# Patient Record
Sex: Female | Born: 1950 | Race: Black or African American | Hispanic: No | Marital: Married | Smoking: Former smoker
Health system: Southern US, Community
[De-identification: ages and names within clinical notes are randomized; demographics above are authoritative.]

## PROBLEM LIST (undated history)

## (undated) DIAGNOSIS — D151 Benign neoplasm of heart: Secondary | ICD-10-CM

## (undated) DIAGNOSIS — M109 Gout, unspecified: Secondary | ICD-10-CM

## (undated) DIAGNOSIS — N185 Chronic kidney disease, stage 5: Secondary | ICD-10-CM

## (undated) DIAGNOSIS — J449 Chronic obstructive pulmonary disease, unspecified: Secondary | ICD-10-CM

## (undated) DIAGNOSIS — I10 Essential (primary) hypertension: Secondary | ICD-10-CM

## (undated) DIAGNOSIS — D696 Thrombocytopenia, unspecified: Secondary | ICD-10-CM

## (undated) DIAGNOSIS — G118 Other hereditary ataxias: Secondary | ICD-10-CM

## (undated) DIAGNOSIS — I509 Heart failure, unspecified: Secondary | ICD-10-CM

## (undated) DIAGNOSIS — L439 Lichen planus, unspecified: Secondary | ICD-10-CM

## (undated) DIAGNOSIS — D649 Anemia, unspecified: Secondary | ICD-10-CM

## (undated) DIAGNOSIS — D571 Sickle-cell disease without crisis: Secondary | ICD-10-CM

## (undated) HISTORY — PX: GALLBLADDER SURGERY: SHX652

## (undated) HISTORY — PX: TUBAL LIGATION: SHX77

## (undated) HISTORY — DX: Lichen planus, unspecified: L43.9

## (undated) HISTORY — DX: Chronic obstructive pulmonary disease, unspecified: J44.9

## (undated) HISTORY — PX: EYE SURGERY: SHX253

## (undated) HISTORY — PX: CHOLECYSTECTOMY: SHX55

## (undated) HISTORY — DX: Heart failure, unspecified: I50.9

## (undated) HISTORY — DX: Benign neoplasm of heart: D15.1

## (undated) HISTORY — DX: Other hereditary ataxias: G11.8

## (undated) HISTORY — DX: Essential (primary) hypertension: I10

## (undated) HISTORY — DX: Sickle-cell disease without crisis: D57.1

## (undated) HISTORY — DX: Gout, unspecified: M10.9

---

## 2006-01-27 ENCOUNTER — Ambulatory Visit: Payer: Self-pay | Admitting: Internal Medicine

## 2006-01-30 ENCOUNTER — Ambulatory Visit: Payer: Self-pay | Admitting: Internal Medicine

## 2006-03-10 ENCOUNTER — Ambulatory Visit: Payer: Self-pay | Admitting: Internal Medicine

## 2006-04-01 ENCOUNTER — Ambulatory Visit: Payer: Self-pay | Admitting: Internal Medicine

## 2006-06-08 ENCOUNTER — Ambulatory Visit: Payer: Self-pay | Admitting: Internal Medicine

## 2006-07-02 ENCOUNTER — Ambulatory Visit: Payer: Self-pay | Admitting: Internal Medicine

## 2006-08-31 ENCOUNTER — Ambulatory Visit: Payer: Self-pay | Admitting: Internal Medicine

## 2006-09-06 ENCOUNTER — Ambulatory Visit: Payer: Self-pay | Admitting: Internal Medicine

## 2006-09-30 ENCOUNTER — Ambulatory Visit: Payer: Self-pay | Admitting: Internal Medicine

## 2006-12-31 ENCOUNTER — Ambulatory Visit: Payer: Self-pay | Admitting: Internal Medicine

## 2007-01-10 ENCOUNTER — Ambulatory Visit: Payer: Self-pay | Admitting: Internal Medicine

## 2007-01-31 ENCOUNTER — Ambulatory Visit: Payer: Self-pay | Admitting: Internal Medicine

## 2007-03-08 ENCOUNTER — Ambulatory Visit: Payer: Self-pay | Admitting: Internal Medicine

## 2007-04-02 ENCOUNTER — Ambulatory Visit: Payer: Self-pay | Admitting: Internal Medicine

## 2007-05-02 ENCOUNTER — Ambulatory Visit: Payer: Self-pay | Admitting: Internal Medicine

## 2007-05-03 ENCOUNTER — Ambulatory Visit: Payer: Self-pay | Admitting: Internal Medicine

## 2007-06-02 ENCOUNTER — Ambulatory Visit: Payer: Self-pay | Admitting: Internal Medicine

## 2007-09-05 ENCOUNTER — Ambulatory Visit: Payer: Self-pay | Admitting: Internal Medicine

## 2007-10-31 ENCOUNTER — Ambulatory Visit: Payer: Self-pay | Admitting: Internal Medicine

## 2007-11-08 ENCOUNTER — Ambulatory Visit: Payer: Self-pay | Admitting: Internal Medicine

## 2007-11-30 ENCOUNTER — Ambulatory Visit: Payer: Self-pay | Admitting: Internal Medicine

## 2008-03-01 ENCOUNTER — Ambulatory Visit: Payer: Self-pay | Admitting: Internal Medicine

## 2008-03-12 ENCOUNTER — Ambulatory Visit: Payer: Self-pay | Admitting: Internal Medicine

## 2008-04-01 ENCOUNTER — Ambulatory Visit: Payer: Self-pay | Admitting: Internal Medicine

## 2008-08-30 ENCOUNTER — Ambulatory Visit: Payer: Self-pay | Admitting: Internal Medicine

## 2008-09-18 ENCOUNTER — Ambulatory Visit: Payer: Self-pay | Admitting: Internal Medicine

## 2008-09-29 ENCOUNTER — Ambulatory Visit: Payer: Self-pay | Admitting: Internal Medicine

## 2008-12-30 ENCOUNTER — Ambulatory Visit: Payer: Self-pay | Admitting: Internal Medicine

## 2009-01-16 ENCOUNTER — Ambulatory Visit: Payer: Self-pay | Admitting: Internal Medicine

## 2009-01-30 ENCOUNTER — Ambulatory Visit: Payer: Self-pay | Admitting: Internal Medicine

## 2009-04-01 ENCOUNTER — Ambulatory Visit: Payer: Self-pay | Admitting: Internal Medicine

## 2009-04-16 ENCOUNTER — Ambulatory Visit: Payer: Self-pay | Admitting: Internal Medicine

## 2009-05-01 ENCOUNTER — Ambulatory Visit: Payer: Self-pay | Admitting: Internal Medicine

## 2009-05-28 ENCOUNTER — Emergency Department: Payer: Self-pay | Admitting: Emergency Medicine

## 2009-07-30 ENCOUNTER — Ambulatory Visit: Payer: Self-pay | Admitting: Internal Medicine

## 2009-08-15 ENCOUNTER — Other Ambulatory Visit: Payer: Self-pay | Admitting: Internal Medicine

## 2009-08-15 ENCOUNTER — Ambulatory Visit: Payer: Self-pay | Admitting: Internal Medicine

## 2009-08-28 ENCOUNTER — Ambulatory Visit: Payer: Self-pay | Admitting: Internal Medicine

## 2009-08-29 ENCOUNTER — Ambulatory Visit: Payer: Self-pay | Admitting: Internal Medicine

## 2009-08-30 ENCOUNTER — Ambulatory Visit: Payer: Self-pay | Admitting: Internal Medicine

## 2009-09-29 ENCOUNTER — Ambulatory Visit: Payer: Self-pay | Admitting: Internal Medicine

## 2009-10-15 ENCOUNTER — Ambulatory Visit: Payer: Self-pay | Admitting: Internal Medicine

## 2009-10-30 ENCOUNTER — Ambulatory Visit: Payer: Self-pay | Admitting: Internal Medicine

## 2009-12-30 ENCOUNTER — Ambulatory Visit: Payer: Self-pay | Admitting: Internal Medicine

## 2010-01-14 ENCOUNTER — Ambulatory Visit: Payer: Self-pay | Admitting: Internal Medicine

## 2010-01-30 ENCOUNTER — Ambulatory Visit: Payer: Self-pay | Admitting: Internal Medicine

## 2010-02-20 LAB — BASIC METABOLIC PANEL
BUN: 14 mg/dL (ref 4–21)
Creatinine: 1.1 mg/dL (ref 0.5–1.1)
Potassium: 4.6 mmol/L (ref 3.4–5.3)
Sodium: 141 mmol/L (ref 137–147)

## 2010-02-20 LAB — VITAMIN D 25 HYDROXY (VIT D DEFICIENCY, FRACTURES): Vit D, 25-Hydroxy: 37.9

## 2010-02-20 LAB — HEPATIC FUNCTION PANEL
ALT: 14 U/L (ref 7–35)
Alkaline Phosphatase: 140 U/L — AB (ref 25–125)

## 2010-04-21 ENCOUNTER — Ambulatory Visit: Payer: Self-pay | Admitting: Internal Medicine

## 2010-05-01 ENCOUNTER — Ambulatory Visit: Payer: Self-pay | Admitting: Internal Medicine

## 2010-08-04 ENCOUNTER — Ambulatory Visit: Payer: Self-pay | Admitting: Internal Medicine

## 2010-08-31 ENCOUNTER — Ambulatory Visit: Payer: Self-pay | Admitting: Internal Medicine

## 2010-10-22 ENCOUNTER — Ambulatory Visit: Payer: Self-pay | Admitting: Internal Medicine

## 2010-10-23 ENCOUNTER — Ambulatory Visit: Payer: Self-pay | Admitting: Internal Medicine

## 2010-10-31 ENCOUNTER — Ambulatory Visit: Payer: Self-pay | Admitting: Internal Medicine

## 2010-11-30 ENCOUNTER — Ambulatory Visit: Payer: Self-pay | Admitting: Internal Medicine

## 2010-12-31 ENCOUNTER — Ambulatory Visit: Payer: Self-pay | Admitting: Internal Medicine

## 2011-01-31 ENCOUNTER — Ambulatory Visit: Payer: Self-pay | Admitting: Internal Medicine

## 2011-03-02 ENCOUNTER — Ambulatory Visit: Payer: Self-pay | Admitting: Internal Medicine

## 2011-04-27 ENCOUNTER — Ambulatory Visit: Payer: Self-pay | Admitting: Internal Medicine

## 2011-05-02 ENCOUNTER — Ambulatory Visit: Payer: Self-pay | Admitting: Internal Medicine

## 2011-05-02 DIAGNOSIS — D151 Benign neoplasm of heart: Secondary | ICD-10-CM

## 2011-05-02 HISTORY — DX: Benign neoplasm of heart: D15.1

## 2011-06-23 ENCOUNTER — Emergency Department: Payer: Self-pay | Admitting: Emergency Medicine

## 2011-06-29 ENCOUNTER — Ambulatory Visit: Payer: Self-pay | Admitting: Internal Medicine

## 2011-06-29 LAB — CANCER CENTER HEMOGLOBIN: HGB: 6.5 g/dL — ABNORMAL LOW (ref 12.0–16.0)

## 2011-06-29 LAB — FERRITIN: Ferritin (ARMC): 485 ng/mL — ABNORMAL HIGH (ref 8–388)

## 2011-07-03 ENCOUNTER — Ambulatory Visit: Payer: Self-pay | Admitting: Internal Medicine

## 2011-07-10 ENCOUNTER — Ambulatory Visit: Payer: Self-pay | Admitting: Cardiology

## 2011-07-22 ENCOUNTER — Encounter: Payer: Self-pay | Admitting: Internal Medicine

## 2011-07-23 ENCOUNTER — Encounter: Payer: Self-pay | Admitting: Internal Medicine

## 2011-07-23 ENCOUNTER — Ambulatory Visit (INDEPENDENT_AMBULATORY_CARE_PROVIDER_SITE_OTHER): Payer: Self-pay | Admitting: Internal Medicine

## 2011-07-23 DIAGNOSIS — D571 Sickle-cell disease without crisis: Secondary | ICD-10-CM

## 2011-07-23 DIAGNOSIS — Z0000 Encounter for general adult medical examination without abnormal findings: Secondary | ICD-10-CM | POA: Insufficient documentation

## 2011-07-23 DIAGNOSIS — D151 Benign neoplasm of heart: Secondary | ICD-10-CM

## 2011-07-24 ENCOUNTER — Encounter: Payer: Self-pay | Admitting: Internal Medicine

## 2011-07-24 ENCOUNTER — Ambulatory Visit: Payer: Self-pay | Admitting: Internal Medicine

## 2011-07-24 NOTE — Assessment & Plan Note (Signed)
Symptoms stable and very well-controlled. Patient follows with hematology. She is on Exjade. Will obtain previous records from hematology.

## 2011-07-24 NOTE — Progress Notes (Signed)
Subjective:    Patient ID: Jane Short, female    DOB: [birth year], 61 y.o.   MRN: [id number]  HPI 61 year old female with history of sickle cell disease presents for annual exam. She reports that earlier this year she developed a cough and had a chest x-ray showing a possible mass within her heart. 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 She notes that she is being evaluated by cardiology and has undergone EKG and echocardiogram. She is scheduled for stress test tomorrow. She will then followup with cardiothoracic surgery at [facility] for possible resection of the mass. She denies any recent increased shortness of breath, chest pain, cough, lower extremity edema or other symptoms.  She reports she is generally been feeling well. She did have one sickle cell crisis over the winter, which resolved with rest and increased fluid hydration.  Outpatient Encounter Prescriptions as of 07/23/2011  Medication Sig Dispense Refill  . deferasirox (EXJADE) 500 MG disintegrating tablet Take by mouth daily before breakfast.      . folic acid (FOLVITE) 1 MG tablet Take 1 mg by mouth daily.        Review of Systems  Constitutional: Negative for fever, chills, appetite change, fatigue and unexpected weight change.  HENT: Negative for ear pain, congestion, sore throat, trouble swallowing, neck pain, voice change and sinus pressure.   Eyes: Negative for visual disturbance.  Respiratory: Negative for cough, shortness of breath, wheezing and stridor.   Cardiovascular: Negative for chest pain, palpitations and leg swelling.  Gastrointestinal: Negative for nausea, vomiting, abdominal pain, diarrhea, constipation, blood in stool, abdominal distention and anal bleeding.  Genitourinary: Negative for dysuria and flank pain.  Musculoskeletal: Negative for myalgias, arthralgias and gait problem.  Skin: Negative for color change and rash.  Neurological: Negative for dizziness and headaches.    Hematological: Negative for adenopathy. Does not bruise/bleed easily.  Psychiatric/Behavioral: Negative for suicidal ideas, sleep disturbance and dysphoric mood. The patient is not nervous/anxious.    BP 128/46  Pulse 86  Temp(Src) 98.4 F (36.9 C) (Oral)  Ht 5' 5.5" (1.664 m)  Wt 182 lb (82.555 kg)  BMI 29.83 kg/m2  SpO2 97%     Objective:   Physical Exam  Constitutional: She is oriented to person, place, and time. She appears well-developed and well-nourished. No distress.  HENT:  Head: Normocephalic and atraumatic.  Right Ear: External ear normal.  Left Ear: External ear normal.  Nose: Nose normal.  Mouth/Throat: Oropharynx is clear and moist. No oropharyngeal exudate.  Eyes: Conjunctivae are normal. Pupils are equal, round, and reactive to light. Right eye exhibits no discharge. Left eye exhibits no discharge. No scleral icterus.  Neck: Normal range of motion. Neck supple. No tracheal deviation present. No thyromegaly present.  Cardiovascular: Normal rate, regular rhythm and intact distal pulses.  Exam reveals no gallop and no friction rub.   Murmur heard. Pulmonary/Chest: Effort normal and breath sounds normal. No respiratory distress. She has no wheezes. She has no rales. She exhibits no tenderness. Right breast exhibits no inverted nipple, no mass, no nipple discharge, no skin change and no tenderness. Left breast exhibits no inverted nipple, no mass, no nipple discharge, no skin change and no tenderness. Breasts are symmetrical.  Abdominal: Soft. Bowel sounds are normal. She exhibits no distension and no mass. There is no tenderness. There is no rebound and no guarding.  Musculoskeletal: Normal range of motion. She exhibits no edema and no tenderness.  Lymphadenopathy:    She has no  cervical adenopathy.  Neurological: She is alert and oriented to person, place, and time. No cranial nerve deficit. She exhibits normal muscle tone. Coordination normal.  Skin: Skin is warm and  dry. No rash noted. She is not diaphoretic. No erythema. No pallor.  Psychiatric: She has a normal mood and affect. Her behavior is normal. Judgment and thought content normal.          Assessment & Plan:

## 2011-07-24 NOTE — Assessment & Plan Note (Addendum)
Patient is up-to-date on health maintenance. Breast exam is normal today.  She is overdue for mammogram but would prefer to defer until after more acute issues with atrial myxoma are resolved. Will get records on PAP from last year. Will get records on lab work from her hematologist. She is due for lipid profile, and if this has not already been obtained by her hematologist will order. She is up-to-date on her vaccinations. She will followup here in one year.

## 2011-07-24 NOTE — Assessment & Plan Note (Signed)
Patient reports recent diagnosis of this. Planning for stress test tomorrow and surgical evaluation next week. Will obtain records.

## 2011-08-05 ENCOUNTER — Encounter: Payer: Self-pay | Admitting: Internal Medicine

## 2011-08-06 ENCOUNTER — Encounter: Payer: Self-pay | Admitting: Internal Medicine

## 2011-09-02 ENCOUNTER — Ambulatory Visit: Payer: Self-pay | Admitting: Internal Medicine

## 2011-09-02 LAB — FERRITIN: Ferritin (ARMC): 564 ng/mL — ABNORMAL HIGH (ref 8–388)

## 2011-09-02 LAB — IRON AND TIBC
Iron Bind.Cap.(Total): 274 ug/dL (ref 250–450)
Iron Saturation: 48 %
Iron: 131 ug/dL (ref 50–170)
Unbound Iron-Bind.Cap.: 143 ug/dL

## 2011-09-30 ENCOUNTER — Ambulatory Visit: Payer: Self-pay | Admitting: Internal Medicine

## 2011-10-07 ENCOUNTER — Telehealth: Payer: Self-pay | Admitting: *Deleted

## 2011-10-07 NOTE — Telephone Encounter (Signed)
Kim with [facility] called and said that patient is due for a mammogram. I scheduled patient for May the 29 th at 2:30. Patient is aware of appt.

## 2011-10-27 LAB — CBC CANCER CENTER
Basophil: 2 %
Eosinophil: 1 %
MCH: 35.7 pg — ABNORMAL HIGH (ref 26.0–34.0)
MCHC: 33.7 g/dL (ref 32.0–36.0)
MCV: 106 fL — ABNORMAL HIGH (ref 80–100)
Platelet: 226 x10 3/mm (ref 150–440)
RBC: 1.81 10*6/uL — ABNORMAL LOW (ref 3.80–5.20)
RDW: 27.8 % — ABNORMAL HIGH (ref 11.5–14.5)
Segmented Neutrophils: 35 %
WBC: 9.4 x10 3/mm (ref 3.6–11.0)

## 2011-10-27 LAB — HEPATIC FUNCTION PANEL A (ARMC)
Alkaline Phosphatase: 134 U/L (ref 50–136)
SGOT(AST): 43 U/L — ABNORMAL HIGH (ref 15–37)
Total Protein: 7.1 g/dL (ref 6.4–8.2)

## 2011-10-27 LAB — CREATININE, SERUM
Creatinine: 1.16 mg/dL (ref 0.60–1.30)
EGFR (African American): 59 — ABNORMAL LOW

## 2011-10-27 LAB — FERRITIN: Ferritin (ARMC): 738 ng/mL — ABNORMAL HIGH (ref 8–388)

## 2011-10-31 ENCOUNTER — Ambulatory Visit: Payer: Self-pay

## 2011-10-31 ENCOUNTER — Ambulatory Visit: Payer: Self-pay | Admitting: Internal Medicine

## 2011-11-12 ENCOUNTER — Telehealth: Payer: Self-pay | Admitting: Internal Medicine

## 2011-11-12 NOTE — Telephone Encounter (Signed)
Caller: Shalese/Patient; PCP: Ronna Polio; CB#: [phone]; ; ; Call regarding Swelling Feet, Legs; Up To Her Abdomen;  Pt calling regarding increase in lower extremity edema up to abdomen that causes pt moderate pain. Pt scheduled to get "infusion" at the [facility] tomorrow for her Sickle Cell but states that her lower extremity edema is significantly worse today and she will not see MD tomorrow (6/14). She thinks someone should "look at it". Denies any worsening of normal SOB. Pt at work but sitting. Disp: See provider w/in 24 hrs per Edema, Atruamatic for "Edema newly worse than usual pattern OR newly developed in last 12 hrs". No appt available today or tomorrow (6/13-14). Please call pt back with instruction/appt.

## 2011-11-12 NOTE — Telephone Encounter (Signed)
She should probably be seen by the physician at the [facility] tomorrow prior to her infusion, as we are already overbooked today.

## 2011-11-12 NOTE — Telephone Encounter (Signed)
Spoke with patient and advised as instructed.  She stated that this is not an urgent issue, she has been dealing with this for a while now.  She requested an appt with Dr. Dan Humphreys for next week.  Appt scheduled for 11/19/2011 at 2:30.

## 2011-11-19 ENCOUNTER — Ambulatory Visit (INDEPENDENT_AMBULATORY_CARE_PROVIDER_SITE_OTHER): Payer: BC Managed Care – PPO | Admitting: Internal Medicine

## 2011-11-19 ENCOUNTER — Encounter: Payer: Self-pay | Admitting: Internal Medicine

## 2011-11-19 VITALS — BP 130/80 | HR 67 | Temp 98.2°F | Ht 65.0 in | Wt 193.5 lb

## 2011-11-19 DIAGNOSIS — D571 Sickle-cell disease without crisis: Secondary | ICD-10-CM

## 2011-11-19 DIAGNOSIS — R0600 Dyspnea, unspecified: Secondary | ICD-10-CM | POA: Insufficient documentation

## 2011-11-19 DIAGNOSIS — R0609 Other forms of dyspnea: Secondary | ICD-10-CM

## 2011-11-19 DIAGNOSIS — R609 Edema, unspecified: Secondary | ICD-10-CM

## 2011-11-19 DIAGNOSIS — D151 Benign neoplasm of heart: Secondary | ICD-10-CM

## 2011-11-19 DIAGNOSIS — R0989 Other specified symptoms and signs involving the circulatory and respiratory systems: Secondary | ICD-10-CM

## 2011-11-19 MED ORDER — ALBUTEROL SULFATE HFA 108 (90 BASE) MCG/ACT IN AERS: 2.0000 | INHALATION_SPRAY | Freq: Four times a day (QID) | RESPIRATORY_TRACT | Status: DC | PRN

## 2011-11-19 MED ORDER — FUROSEMIDE 20 MG PO TABS: 20.0000 mg | ORAL_TABLET | Freq: Two times a day (BID) | ORAL | Status: DC

## 2011-11-19 NOTE — Progress Notes (Signed)
Subjective:    Patient ID: Jane Short, female    DOB: 1951/02/14, 61 y.o.   MRN: [id number]  HPI 61 year old female with history of sickle cell disease presents for acute visit complaining of 2 weeks of diffuse lower extremity edema and shortness of breath. She reports that symptoms first began approximately 2 weeks ago. She initially had swelling in her ankles and then this spread up her legs and extended to her thighs. She has been taking her daily Lasix with no improvement. She denies any decrease in urination. She also notes some increase in shortness of breath. She has some chronic dyspnea at baseline. She continues to smoke. She has been followed by hematology for her monthly blood transfusions and notes that they have been using a new medication during the transfusion. She questions whether this medication may be contributing to swelling.  She was recently diagnosed with atrial myxoma. At her last visit, she had planned to have surgical resection of this mass with cardiothoracic surgery at [facility]. However, she reports that surgery was canceled because mass appeared to be intracardiac and no additional treatment was felt to be necessary. She has not had any chest pain or palpitations.  Outpatient Encounter Prescriptions as of 11/19/2011  Medication Sig Dispense Refill  . albuterol (PROVENTIL HFA;VENTOLIN HFA) 108 (90 BASE) MCG/ACT inhaler Inhale 2 puffs into the lungs every 6 (six) hours as needed.  18 g  6  . deferasirox (EXJADE) 500 MG disintegrating tablet Take by mouth daily before breakfast.      . furosemide (LASIX) 20 MG tablet Take 1 tablet (20 mg total) by mouth 2 (two) times daily.  60 tablet  1  . potassium chloride (K-DUR) 10 MEQ tablet Take 10 mEq by mouth daily.      Marland Kitchen DISCONTD: albuterol (PROVENTIL HFA;VENTOLIN HFA) 108 (90 BASE) MCG/ACT inhaler Inhale 2 puffs into the lungs every 6 (six) hours as needed.      Marland Kitchen DISCONTD: furosemide (LASIX) 20 MG tablet Take 20 mg by  mouth daily.      . folic acid (FOLVITE) 1 MG tablet Take 1 mg by mouth daily.        Review of Systems  Constitutional: Negative for fever, chills, appetite change, fatigue and unexpected weight change.  HENT: Negative for ear pain, congestion, sore throat, trouble swallowing, neck pain, voice change and sinus pressure.   Eyes: Negative for visual disturbance.  Respiratory: Positive for shortness of breath. Negative for cough, wheezing and stridor.   Cardiovascular: Positive for leg swelling. Negative for chest pain and palpitations.  Gastrointestinal: Negative for nausea, vomiting, abdominal pain, diarrhea, constipation, blood in stool, abdominal distention and anal bleeding.  Genitourinary: Negative for dysuria and flank pain.  Musculoskeletal: Negative for myalgias, arthralgias and gait problem.  Skin: Negative for color change and rash.  Neurological: Negative for dizziness and headaches.  Hematological: Negative for adenopathy. Does not bruise/bleed easily.  Psychiatric/Behavioral: Negative for suicidal ideas, disturbed wake/sleep cycle and dysphoric mood. The patient is not nervous/anxious.    BP 130/80  Pulse 67  Temp 98.2 F (36.8 C) (Oral)  Ht 5\' 5"  (1.651 m)  Wt 193 lb 8 oz (87.771 kg)  BMI 32.20 kg/m2  SpO2 98%     Objective:   Physical Exam  Constitutional: She is oriented to person, place, and time. She appears well-developed and well-nourished. No distress.  HENT:  Head: Normocephalic and atraumatic.  Right Ear: External ear normal.  Left Ear: External ear normal.  Nose: Nose  normal.  Mouth/Throat: Oropharynx is clear and moist. No oropharyngeal exudate.  Eyes: Conjunctivae are normal. Pupils are equal, round, and reactive to light. Right eye exhibits no discharge. Left eye exhibits no discharge. No scleral icterus.  Neck: Normal range of motion. Neck supple. No tracheal deviation present. No thyromegaly present.  Cardiovascular: Normal rate, regular rhythm,  normal heart sounds and intact distal pulses.  Exam reveals no gallop and no friction rub.   No murmur heard. Pulmonary/Chest: Effort normal. No respiratory distress. She has no wheezes. She has rales (bilateral bases). She exhibits no tenderness.  Musculoskeletal: Normal range of motion. She exhibits edema (pitting to upper thigh bilaterally). She exhibits no tenderness.  Lymphadenopathy:    She has no cervical adenopathy.  Neurological: She is alert and oriented to person, place, and time. No cranial nerve deficit. She exhibits normal muscle tone. Coordination normal.  Skin: Skin is warm and dry. No rash noted. She is not diaphoretic. No erythema. No pallor.  Psychiatric: She has a normal mood and affect. Her behavior is normal. Judgment and thought content normal.          Assessment & Plan:

## 2011-11-19 NOTE — Assessment & Plan Note (Addendum)
Patient with significant lower extremity edema on exam. She has not recently had renal function checked. Will check renal function and urine protein with labs today. Question whether she may have congestive heart failure related to intracardiac mass/atrial myxoma. Will also check BNP with labs. Will increase Lasix to 20 mg twice daily. We'll have her check her weight daily and call if weight increases or decreases by more than 3 pounds in a 24-hour period. Will also try to move up cardiology appointment and obtain echo either this week or next week. She will followup here in one week.

## 2011-11-19 NOTE — Assessment & Plan Note (Signed)
As above, symptoms are concerning for congestive heart failure or acute renal failure leading to pulmonary edema. Will check renal function with labs. We'll also check BNP. Will increase dose of Lasix to 20 mg twice daily. Patient will followup in one week.

## 2011-11-19 NOTE — Patient Instructions (Signed)
Increase Lasix to twice daily.  Monitor weight daily. Call if weight up or down more than 3lbs in 24hours.  Follow up 1 week.

## 2011-11-19 NOTE — Assessment & Plan Note (Signed)
Will get records on recent evaluation at [facility] and decision to hold off on resection.

## 2011-11-19 NOTE — Assessment & Plan Note (Signed)
Reviewed literature on Desferal as patient was concerned that this medication may be contributing to edema. Edema is not listed as a common side effect of this medication. Will request notes from hematology as to recent blood counts and treatment.

## 2011-11-20 LAB — COMPREHENSIVE METABOLIC PANEL
CO2: 24 mEq/L (ref 19–32)
Calcium: 9.9 mg/dL (ref 8.4–10.5)
Creatinine, Ser: 1.3 mg/dL — ABNORMAL HIGH (ref 0.4–1.2)
GFR: 54.57 mL/min — ABNORMAL LOW (ref >60.00)
Glucose, Bld: 77 mg/dL (ref 70–99)
Total Bilirubin: 7.4 mg/dL — ABNORMAL HIGH (ref 0.3–1.2)
Total Protein: 7.2 g/dL (ref 6.0–8.3)

## 2011-11-20 LAB — MICROALBUMIN / CREATININE URINE RATIO
Microalb Creat Ratio: 4.6 mg/g (ref 0.0–30.0)
Microalb, Ur: 2 mg/dL — ABNORMAL HIGH (ref 0.0–1.9)

## 2011-11-26 ENCOUNTER — Telehealth: Payer: Self-pay | Admitting: Internal Medicine

## 2011-11-26 ENCOUNTER — Encounter: Payer: Self-pay | Admitting: Internal Medicine

## 2011-11-26 ENCOUNTER — Ambulatory Visit (INDEPENDENT_AMBULATORY_CARE_PROVIDER_SITE_OTHER): Payer: BC Managed Care – PPO | Admitting: Internal Medicine

## 2011-11-26 VITALS — BP 140/70 | HR 72 | Temp 98.2°F | Ht 65.0 in | Wt 188.5 lb

## 2011-11-26 DIAGNOSIS — N289 Disorder of kidney and ureter, unspecified: Secondary | ICD-10-CM

## 2011-11-26 DIAGNOSIS — R609 Edema, unspecified: Secondary | ICD-10-CM

## 2011-11-26 LAB — COMPREHENSIVE METABOLIC PANEL
AST: 44 U/L — ABNORMAL HIGH (ref 0–37)
Albumin: 3.8 g/dL (ref 3.5–5.2)
Alkaline Phosphatase: 131 U/L — ABNORMAL HIGH (ref 39–117)
BUN: 20 mg/dL (ref 6–23)
Glucose, Bld: 89 mg/dL (ref 70–99)
Potassium: 5.2 mEq/L — ABNORMAL HIGH (ref 3.5–5.1)
Sodium: 139 mEq/L (ref 135–145)
Total Bilirubin: 6.3 mg/dL — ABNORMAL HIGH (ref 0.3–1.2)
Total Protein: 7.3 g/dL (ref 6.0–8.3)

## 2011-11-26 NOTE — Progress Notes (Signed)
Subjective:    Patient ID: Jane Short, female    DOB: 06-Jul-1950, 61 y.o.   MRN: [id number]  HPI 61 year old female with history of sickle cell disease on chronic transfusion therapy presents for followup. She was seen one week ago and complained of increased bilateral lower extremity edema. In the past, she has had some mild edema in her ankles, but she noted over the last few weeks has had significantly worsened and involved her entire leg. At that time, she also had some mild shortness of breath. She was placed on Lasix twice daily and has had some improvement in her symptoms. However, she continues to have significant lower extremity edema which extends up to her lower thigh. She denies any shortness of breath or chest pain. Of note, she has a history of an atrial myxoma and initial plan last year have been to resect this mass. However, she reports that surgeons decided to hold off for now. She has not had followup echocardiogram with her cardiologist.  Outpatient Encounter Prescriptions as of 11/26/2011  Medication Sig Dispense Refill  . albuterol (PROVENTIL HFA;VENTOLIN HFA) 108 (90 BASE) MCG/ACT inhaler Inhale 2 puffs into the lungs every 6 (six) hours as needed.  18 g  6  . deferasirox (EXJADE) 500 MG disintegrating tablet Take by mouth daily before breakfast.      . folic acid (FOLVITE) 1 MG tablet Take 1 mg by mouth daily.      . furosemide (LASIX) 20 MG tablet Take 1 tablet (20 mg total) by mouth 2 (two) times daily.  60 tablet  1  . potassium chloride (K-DUR) 10 MEQ tablet Take 10 mEq by mouth daily.       BP 140/70  Pulse 72  Temp 98.2 F (36.8 C) (Oral)  Ht 5\' 5"  (1.651 m)  Wt 188 lb 8 oz (85.503 kg)  BMI 31.37 kg/m2  SpO2 99%  Review of Systems  Constitutional: Negative for fever, chills, appetite change, fatigue and unexpected weight change.  HENT: Negative for neck pain.   Eyes: Negative for visual disturbance.  Respiratory: Negative for cough, shortness of breath,  wheezing and stridor.   Cardiovascular: Positive for leg swelling. Negative for chest pain and palpitations.  Gastrointestinal: Negative for abdominal pain and abdominal distention.  Genitourinary: Negative for dysuria and flank pain.  Musculoskeletal: Negative for myalgias, arthralgias and gait problem.  Skin: Negative for color change and rash.  Neurological: Negative for dizziness and headaches.  Hematological: Negative for adenopathy. Does not bruise/bleed easily.  Psychiatric/Behavioral: Negative for suicidal ideas, disturbed wake/sleep cycle and dysphoric mood. The patient is not nervous/anxious.        Objective:   Physical Exam  Constitutional: She is oriented to person, place, and time. She appears well-developed and well-nourished. No distress.  HENT:  Head: Normocephalic and atraumatic.  Right Ear: External ear normal.  Left Ear: External ear normal.  Nose: Nose normal.  Mouth/Throat: Oropharynx is clear and moist. No oropharyngeal exudate.  Eyes: Conjunctivae are normal. Pupils are equal, round, and reactive to light. Right eye exhibits no discharge. Left eye exhibits no discharge. No scleral icterus.  Neck: Normal range of motion. Neck supple. No tracheal deviation present. No thyromegaly present.  Cardiovascular: Normal rate, regular rhythm, normal heart sounds and intact distal pulses.  Exam reveals no gallop and no friction rub.   No murmur heard. Pulmonary/Chest: Effort normal and breath sounds normal. No respiratory distress. She has no wheezes. She has no rales. She exhibits no tenderness.  Musculoskeletal: Normal range of motion. She exhibits edema (pitting to lower thigh). She exhibits no tenderness.  Lymphadenopathy:    She has no cervical adenopathy.  Neurological: She is alert and oriented to person, place, and time. No cranial nerve deficit. She exhibits normal muscle tone. Coordination normal.  Skin: Skin is warm and dry. No rash noted. She is not diaphoretic.  No erythema. No pallor.  Psychiatric: She has a normal mood and affect. Her behavior is normal. Judgment and thought content normal.          Assessment & Plan:

## 2011-11-26 NOTE — Assessment & Plan Note (Signed)
Increased Cr noted on recent labs. As above, I am concerned about CHF with h/o sickle cell and atrial myxoma. Will set up cardiology eval. I also set up evaluation with nephrology. Pt would likely benefit from continued care from nephrologist given h/o sickle cell. Will recheck renal function today.

## 2011-11-26 NOTE — Telephone Encounter (Signed)
kassey @ [facility]. Pt can come in today or Monday Please call to make appointment

## 2011-11-26 NOTE — Telephone Encounter (Signed)
Notified patient she is going to the office today for her appointment.

## 2011-11-26 NOTE — Assessment & Plan Note (Addendum)
Slight improvement with bid lasix (5lb weight loss noted), however still with pitting edema to thighs. Will continue lasix for now. Will check renal function with labs today. Will set up follow up with Dr. Juliann Pares asap for ECHO. I am concerned with pt h/o atrial myxoma that she may have worsening CHF. She will follow up here in 3 weeks and prn.

## 2011-11-30 ENCOUNTER — Ambulatory Visit: Payer: Self-pay | Admitting: Internal Medicine

## 2011-12-18 ENCOUNTER — Encounter: Payer: Self-pay | Admitting: Internal Medicine

## 2011-12-18 ENCOUNTER — Ambulatory Visit (INDEPENDENT_AMBULATORY_CARE_PROVIDER_SITE_OTHER): Payer: BC Managed Care – PPO | Admitting: Internal Medicine

## 2011-12-18 VITALS — BP 142/70 | HR 74 | Temp 98.1°F | Ht 65.0 in | Wt 182.5 lb

## 2011-12-18 DIAGNOSIS — R609 Edema, unspecified: Secondary | ICD-10-CM

## 2011-12-18 DIAGNOSIS — D151 Benign neoplasm of heart: Secondary | ICD-10-CM

## 2011-12-18 DIAGNOSIS — N289 Disorder of kidney and ureter, unspecified: Secondary | ICD-10-CM

## 2011-12-18 NOTE — Assessment & Plan Note (Signed)
Weight is down 6 pounds. Edema is markedly improved. Will continue Lasix.

## 2011-12-18 NOTE — Progress Notes (Signed)
Subjective:    Patient ID: Jane Short, female    DOB: November 17, 1950, 61 y.o.   MRN: [id number]  HPI 61 year old female with history of sickle cell disease, atrial myxoma, chronic kidney disease presents for followup after recent episode of lower extremity edema. At her last visit, she had followup with her cardiologist and reports that she had echocardiogram which showed stable cardiac function. She was also seen by nephrology and reports that evaluation is in process. She was started on ramipril. She reports some improvement in her lower extremity edema with the use of Lasix. She reports in general she is feeling better. She denies chest pain or shortness of breath.  Outpatient Encounter Prescriptions as of 12/18/2011  Medication Sig Dispense Refill  . albuterol (PROVENTIL HFA;VENTOLIN HFA) 108 (90 BASE) MCG/ACT inhaler Inhale 2 puffs into the lungs every 6 (six) hours as needed.  18 g  6  . deferasirox (EXJADE) 500 MG disintegrating tablet Take by mouth daily before breakfast.      . folic acid (FOLVITE) 1 MG tablet Take 1 mg by mouth daily.      . furosemide (LASIX) 20 MG tablet Take 1 tablet (20 mg total) by mouth 2 (two) times daily.  60 tablet  1  . potassium chloride (K-DUR) 10 MEQ tablet Take 10 mEq by mouth daily.      . ramipril (ALTACE) 5 MG capsule Take 5 mg by mouth daily.        Review of Systems  Constitutional: Negative for fever, chills, appetite change, fatigue and unexpected weight change.  HENT: Negative for ear pain, congestion, sore throat, trouble swallowing, neck pain, voice change and sinus pressure.   Eyes: Negative for visual disturbance.  Respiratory: Negative for cough, shortness of breath, wheezing and stridor.   Cardiovascular: Positive for leg swelling. Negative for chest pain and palpitations.  Gastrointestinal: Negative for nausea, vomiting, abdominal pain, diarrhea, constipation, blood in stool, abdominal distention and anal bleeding.  Genitourinary: Negative  for dysuria and flank pain.  Musculoskeletal: Negative for myalgias, arthralgias and gait problem.  Skin: Negative for color change and rash.  Neurological: Negative for dizziness and headaches.  Hematological: Negative for adenopathy. Does not bruise/bleed easily.  Psychiatric/Behavioral: Negative for suicidal ideas, disturbed wake/sleep cycle and dysphoric mood. The patient is not nervous/anxious.    BP 142/70  Pulse 74  Temp 98.1 F (36.7 C) (Oral)  Ht 5\' 5"  (1.651 m)  Wt 182 lb 8 oz (82.781 kg)  BMI 30.37 kg/m2  SpO2 98%     Objective:   Physical Exam  Constitutional: She is oriented to person, place, and time. She appears well-developed and well-nourished. No distress.  HENT:  Head: Normocephalic and atraumatic.  Right Ear: External ear normal.  Left Ear: External ear normal.  Nose: Nose normal.  Mouth/Throat: Oropharynx is clear and moist. No oropharyngeal exudate.  Eyes: Conjunctivae are normal. Pupils are equal, round, and reactive to light. Right eye exhibits no discharge. Left eye exhibits no discharge. No scleral icterus.  Neck: Normal range of motion. Neck supple. No tracheal deviation present. No thyromegaly present.  Cardiovascular: Normal rate, regular rhythm, normal heart sounds and intact distal pulses.  Exam reveals no gallop and no friction rub.   No murmur heard. Pulmonary/Chest: Effort normal and breath sounds normal. No respiratory distress. She has no wheezes. She has no rales. She exhibits no tenderness.  Musculoskeletal: Normal range of motion. She exhibits edema. She exhibits no tenderness.  Lymphadenopathy:    She has no  cervical adenopathy.  Neurological: She is alert and oriented to person, place, and time. No cranial nerve deficit. She exhibits normal muscle tone. Coordination normal.  Skin: Skin is warm and dry. No rash noted. She is not diaphoretic. No erythema. No pallor.  Psychiatric: She has a normal mood and affect. Her behavior is normal.  Judgment and thought content normal.          Assessment & Plan:

## 2011-12-18 NOTE — Assessment & Plan Note (Signed)
Evaluation by nephrology in process. Will obtain recent lab work.

## 2011-12-18 NOTE — Assessment & Plan Note (Signed)
Patient reports recent echo showed stable cardiac function. Will obtain records from her cardiologist.

## 2012-02-29 ENCOUNTER — Ambulatory Visit: Payer: Self-pay | Admitting: Internal Medicine

## 2012-02-29 ENCOUNTER — Other Ambulatory Visit: Payer: Self-pay | Admitting: Nephrology

## 2012-02-29 LAB — IRON AND TIBC: Iron Saturation: 37 %

## 2012-02-29 LAB — BASIC METABOLIC PANEL
Anion Gap: 7 (ref 7–16)
Chloride: 109 mmol/L — ABNORMAL HIGH (ref 98–107)
Co2: 26 mmol/L (ref 21–32)
Creatinine: 1.75 mg/dL — ABNORMAL HIGH (ref 0.60–1.30)
EGFR (Non-African Amer.): 31 — ABNORMAL LOW
Glucose: 83 mg/dL (ref 65–99)
Osmolality: 287 (ref 275–301)

## 2012-02-29 LAB — CANCER CENTER HEMOGLOBIN: HGB: 4.8 g/dL — CL (ref 12.0–16.0)

## 2012-02-29 LAB — FERRITIN: Ferritin (ARMC): 320 ng/mL (ref 8–388)

## 2012-03-01 ENCOUNTER — Ambulatory Visit: Payer: Self-pay | Admitting: Internal Medicine

## 2012-03-18 ENCOUNTER — Ambulatory Visit: Payer: BC Managed Care – PPO | Admitting: Internal Medicine

## 2012-04-01 ENCOUNTER — Ambulatory Visit: Payer: Self-pay | Admitting: Internal Medicine

## 2012-05-02 ENCOUNTER — Ambulatory Visit: Payer: Self-pay | Admitting: Internal Medicine

## 2012-05-02 LAB — CBC CANCER CENTER
Basophil %: 1.8 %
Eosinophil #: 0.3 x10 3/mm (ref 0.0–0.7)
HCT: 15 % — CL (ref 35.0–47.0)
HGB: 5.3 g/dL — ABNORMAL LOW (ref 12.0–16.0)
Lymphocyte #: 4.7 x10 3/mm — ABNORMAL HIGH (ref 1.0–3.6)
Lymphocyte %: 34.9 %
MCH: 35.2 pg — ABNORMAL HIGH (ref 26.0–34.0)
MCV: 101 fL — ABNORMAL HIGH (ref 80–100)
Monocyte %: 15.6 %
Neutrophil #: 6.2 x10 3/mm (ref 1.4–6.5)
Platelet: 257 x10 3/mm (ref 150–440)
RDW: 27.7 % — ABNORMAL HIGH (ref 11.5–14.5)
WBC: 13.5 x10 3/mm — ABNORMAL HIGH (ref 3.6–11.0)

## 2012-05-02 LAB — URINALYSIS, COMPLETE
Bacteria: NONE SEEN
Bilirubin,UR: NEGATIVE
Blood: NEGATIVE
Glucose,UR: NEGATIVE mg/dL (ref 0–75)
Ketone: NEGATIVE
Ph: 7 (ref 4.5–8.0)
Specific Gravity: 1.009 (ref 1.003–1.030)
Squamous Epithelial: NONE SEEN

## 2012-05-02 LAB — HEPATIC FUNCTION PANEL A (ARMC)
Albumin: 3.7 g/dL (ref 3.4–5.0)
SGOT(AST): 56 U/L — ABNORMAL HIGH (ref 15–37)
SGPT (ALT): 24 U/L (ref 12–78)

## 2012-05-02 LAB — CREATININE, SERUM: EGFR (Non-African Amer.): 28 — ABNORMAL LOW

## 2012-05-02 LAB — FERRITIN: Ferritin (ARMC): 357 ng/mL (ref 8–388)

## 2012-05-05 LAB — BASIC METABOLIC PANEL
Anion Gap: 9 (ref 7–16)
BUN: 22 mg/dL — ABNORMAL HIGH (ref 7–18)
Chloride: 106 mmol/L (ref 98–107)
Co2: 24 mmol/L (ref 21–32)
Creatinine: 1.44 mg/dL — ABNORMAL HIGH (ref 0.60–1.30)
EGFR (African American): 45 — ABNORMAL LOW
Osmolality: 281 (ref 275–301)

## 2012-05-09 LAB — BASIC METABOLIC PANEL
BUN: 32 mg/dL — ABNORMAL HIGH (ref 7–18)
Chloride: 106 mmol/L (ref 98–107)
Co2: 25 mmol/L (ref 21–32)
Creatinine: 1.55 mg/dL — ABNORMAL HIGH (ref 0.60–1.30)
EGFR (African American): 41 — ABNORMAL LOW
EGFR (Non-African Amer.): 36 — ABNORMAL LOW
Glucose: 104 mg/dL — ABNORMAL HIGH (ref 65–99)
Osmolality: 288 (ref 275–301)
Sodium: 141 mmol/L (ref 136–145)

## 2012-05-09 LAB — CBC CANCER CENTER
Basophil #: 0.2 x10 3/mm — ABNORMAL HIGH (ref 0.0–0.1)
Basophil %: 1.9 %
Eosinophil #: 0.2 x10 3/mm (ref 0.0–0.7)
HGB: 5.8 g/dL — ABNORMAL LOW (ref 12.0–16.0)
Lymphocyte %: 35.9 %
MCHC: 36.1 g/dL — ABNORMAL HIGH (ref 32.0–36.0)
Monocyte %: 14 %
Neutrophil #: 5.7 x10 3/mm (ref 1.4–6.5)
Neutrophil %: 46.9 %
RDW: 20 % — ABNORMAL HIGH (ref 11.5–14.5)

## 2012-05-16 LAB — CBC CANCER CENTER
Eosinophil: 4 %
HGB: 6.3 g/dL — ABNORMAL LOW (ref 12.0–16.0)
MCH: 33.4 pg (ref 26.0–34.0)
MCHC: 35.6 g/dL (ref 32.0–36.0)
MCV: 94 fL (ref 80–100)
Monocytes: 12 %
Platelet: 314 x10 3/mm (ref 150–440)
RBC: 1.88 10*6/uL — ABNORMAL LOW (ref 3.80–5.20)
RDW: 19.3 % — ABNORMAL HIGH (ref 11.5–14.5)
Segmented Neutrophils: 45 %

## 2012-05-23 LAB — CBC CANCER CENTER
Basophil #: 0.2 x10 3/mm — ABNORMAL HIGH (ref 0.0–0.1)
Basophil %: 1.5 %
Eosinophil #: 0.3 x10 3/mm (ref 0.0–0.7)
Eosinophil %: 2.3 %
HCT: 16.8 % — ABNORMAL LOW (ref 35.0–47.0)
HGB: 5.7 g/dL — ABNORMAL LOW (ref 12.0–16.0)
Lymphocyte #: 3.1 x10 3/mm (ref 1.0–3.6)
Lymphocyte %: 28.4 %
MCHC: 33.8 g/dL (ref 32.0–36.0)
MCV: 96 fL (ref 80–100)
Monocyte #: 1.8 x10 3/mm — ABNORMAL HIGH (ref 0.2–0.9)
Monocyte %: 16.6 %
Neutrophil #: 5.7 x10 3/mm (ref 1.4–6.5)
RBC: 1.75 10*6/uL — ABNORMAL LOW (ref 3.80–5.20)
RDW: 20.3 % — ABNORMAL HIGH (ref 11.5–14.5)
WBC: 11.1 x10 3/mm — ABNORMAL HIGH (ref 3.6–11.0)

## 2012-05-23 LAB — OCCULT BLOOD X 1 CARD TO LAB, STOOL: Occult Blood, Feces: NEGATIVE

## 2012-05-30 LAB — CBC CANCER CENTER
Basophil #: 0.2 x10 3/mm — ABNORMAL HIGH (ref 0.0–0.1)
Basophil %: 2 %
Eosinophil %: 2.7 %
HCT: 15.8 % — ABNORMAL LOW (ref 35.0–47.0)
Lymphocyte %: 31.9 %
MCV: 95 fL (ref 80–100)
Monocyte %: 15 %
Neutrophil #: 4.7 x10 3/mm (ref 1.4–6.5)
Neutrophil %: 48.4 %
RBC: 1.66 10*6/uL — ABNORMAL LOW (ref 3.80–5.20)
WBC: 9.6 x10 3/mm (ref 3.6–11.0)

## 2012-05-30 LAB — BASIC METABOLIC PANEL
Anion Gap: 9 (ref 7–16)
Chloride: 107 mmol/L (ref 98–107)
Co2: 23 mmol/L (ref 21–32)
Creatinine: 1.9 mg/dL — ABNORMAL HIGH (ref 0.60–1.30)
EGFR (African American): 32 — ABNORMAL LOW
Osmolality: 286 (ref 275–301)
Potassium: 5.5 mmol/L — ABNORMAL HIGH (ref 3.5–5.1)

## 2012-05-30 LAB — LACTATE DEHYDROGENASE: LDH: 796 U/L — ABNORMAL HIGH (ref 81–246)

## 2012-05-30 LAB — IRON AND TIBC: Unbound Iron-Bind.Cap.: 127 ug/dL

## 2012-05-30 LAB — RETICULOCYTES: Absolute Retic Count: 0.138 10*6/uL — ABNORMAL HIGH (ref 0.023–0.096)

## 2012-06-01 ENCOUNTER — Ambulatory Visit: Payer: Self-pay | Admitting: Internal Medicine

## 2012-06-01 HISTORY — PX: COLONOSCOPY: SHX174

## 2012-06-06 LAB — BASIC METABOLIC PANEL
Anion Gap: 11 (ref 7–16)
BUN: 38 mg/dL — ABNORMAL HIGH (ref 7–18)
Calcium, Total: 9.7 mg/dL (ref 8.5–10.1)
Chloride: 107 mmol/L (ref 98–107)
Co2: 19 mmol/L — ABNORMAL LOW (ref 21–32)
EGFR (African American): 33 — ABNORMAL LOW
EGFR (Non-African Amer.): 29 — ABNORMAL LOW
Glucose: 109 mg/dL — ABNORMAL HIGH (ref 65–99)
Osmolality: 283 (ref 275–301)
Sodium: 137 mmol/L (ref 136–145)

## 2012-06-06 LAB — CBC CANCER CENTER
Basophil #: 0.1 x10 3/mm (ref 0.0–0.1)
Basophil %: 1.3 %
Eosinophil #: 0.2 x10 3/mm (ref 0.0–0.7)
Eosinophil %: 1.8 %
HCT: 20.5 % — ABNORMAL LOW (ref 35.0–47.0)
HGB: 7.1 g/dL — ABNORMAL LOW (ref 12.0–16.0)
Lymphocyte #: 2.4 x10 3/mm (ref 1.0–3.6)
Lymphocyte %: 27.1 %
MCH: 31.6 pg (ref 26.0–34.0)
MCHC: 34.8 g/dL (ref 32.0–36.0)
MCV: 91 fL (ref 80–100)
Monocyte %: 17.6 %
Neutrophil #: 4.6 x10 3/mm (ref 1.4–6.5)
Neutrophil %: 52.2 %
Platelet: 225 x10 3/mm (ref 150–440)
RBC: 2.26 10*6/uL — ABNORMAL LOW (ref 3.80–5.20)
WBC: 8.8 x10 3/mm (ref 3.6–11.0)

## 2012-06-07 LAB — POTASSIUM: Potassium: 5.4 mmol/L — ABNORMAL HIGH (ref 3.5–5.1)

## 2012-06-13 LAB — CBC CANCER CENTER
Basophil %: 1.1 %
Eosinophil #: 0.1 x10 3/mm (ref 0.0–0.7)
Eosinophil %: 1.2 %
Lymphocyte #: 5.8 x10 3/mm — ABNORMAL HIGH (ref 1.0–3.6)
MCH: 29.6 pg (ref 26.0–34.0)
MCHC: 32.6 g/dL (ref 32.0–36.0)
Monocyte %: 12.6 %
RDW: 18.4 % — ABNORMAL HIGH (ref 11.5–14.5)

## 2012-06-20 LAB — CBC CANCER CENTER
Basophil %: 1.3 %
Eosinophil #: 0.2 x10 3/mm (ref 0.0–0.7)
Eosinophil %: 2 %
MCH: 31.5 pg (ref 26.0–34.0)
MCHC: 34.3 g/dL (ref 32.0–36.0)
MCV: 92 fL (ref 80–100)
Monocyte #: 1.9 x10 3/mm — ABNORMAL HIGH (ref 0.2–0.9)
Monocyte %: 17.8 %
Neutrophil %: 42.6 %
RDW: 19.8 % — ABNORMAL HIGH (ref 11.5–14.5)
WBC: 10.7 x10 3/mm (ref 3.6–11.0)

## 2012-06-20 LAB — BASIC METABOLIC PANEL
Anion Gap: 9 (ref 7–16)
BUN: 13 mg/dL (ref 7–18)
Calcium, Total: 9.4 mg/dL (ref 8.5–10.1)
Chloride: 105 mmol/L (ref 98–107)
Co2: 24 mmol/L (ref 21–32)
Creatinine: 1.54 mg/dL — ABNORMAL HIGH (ref 0.60–1.30)
EGFR (African American): 42 — ABNORMAL LOW
Glucose: 102 mg/dL — ABNORMAL HIGH (ref 65–99)
Osmolality: 276 (ref 275–301)
Sodium: 138 mmol/L (ref 136–145)

## 2012-06-20 LAB — HEPATIC FUNCTION PANEL A (ARMC)
Albumin: 3.7 g/dL (ref 3.4–5.0)
SGOT(AST): 34 U/L (ref 15–37)
Total Protein: 7.4 g/dL (ref 6.4–8.2)

## 2012-07-02 ENCOUNTER — Ambulatory Visit: Payer: Self-pay | Admitting: Internal Medicine

## 2012-07-12 ENCOUNTER — Ambulatory Visit: Payer: Self-pay | Admitting: Nephrology

## 2012-07-28 ENCOUNTER — Telehealth: Payer: Self-pay | Admitting: Internal Medicine

## 2012-07-28 NOTE — Telephone Encounter (Signed)
Patient informed and appointment has been scheduled. 

## 2012-07-28 NOTE — Telephone Encounter (Signed)
Pt was found to have enlarged pulmonary arteries on CXR. This is most consistent with pulmonary hypertension. I would like to set up a visit with her to discuss.

## 2012-07-30 ENCOUNTER — Ambulatory Visit: Payer: Self-pay | Admitting: Internal Medicine

## 2012-08-04 ENCOUNTER — Encounter: Payer: Self-pay | Admitting: Internal Medicine

## 2012-08-04 ENCOUNTER — Ambulatory Visit (INDEPENDENT_AMBULATORY_CARE_PROVIDER_SITE_OTHER): Payer: BC Managed Care – PPO | Admitting: Internal Medicine

## 2012-08-04 VITALS — BP 130/60 | HR 80 | Temp 98.0°F | Wt 179.0 lb

## 2012-08-04 DIAGNOSIS — R918 Other nonspecific abnormal finding of lung field: Secondary | ICD-10-CM

## 2012-08-04 DIAGNOSIS — Z1211 Encounter for screening for malignant neoplasm of colon: Secondary | ICD-10-CM

## 2012-08-04 DIAGNOSIS — D151 Benign neoplasm of heart: Secondary | ICD-10-CM

## 2012-08-04 DIAGNOSIS — Z1239 Encounter for other screening for malignant neoplasm of breast: Secondary | ICD-10-CM

## 2012-08-04 DIAGNOSIS — R9389 Abnormal findings on diagnostic imaging of other specified body structures: Secondary | ICD-10-CM | POA: Insufficient documentation

## 2012-08-04 DIAGNOSIS — D571 Sickle-cell disease without crisis: Secondary | ICD-10-CM

## 2012-08-04 NOTE — Assessment & Plan Note (Signed)
Symptomatically doing well. Continues to follow with Dr. Sherrlyn Hock.

## 2012-08-04 NOTE — Assessment & Plan Note (Signed)
Will set up screening colonoscopy. 

## 2012-08-04 NOTE — Assessment & Plan Note (Signed)
Will set up mammogram. 

## 2012-08-04 NOTE — Progress Notes (Signed)
Subjective:    Patient ID: Jane Short, female    DOB: [birth year], 62 y.o.   MRN: [id number]  HPI 62 year old female with history of sickle cell anemia, tobacco abuse, hypertension presents for followup. She was recently noted to have abnormal chest x-ray by her oncologist with enlarged pulmonary vessels consistent with pulmonary hypertension. She reports she was recently seen by her cardiologist and underwent an echocardiogram. She also has a known history of atrial myxoma and was evaluated by cardiothoracic surgery. She reports that decision was made not to pursue resection of the myxoma at this time. She feels that her symptoms of shortness of breath with exertion have been stable over several years. She denies any chest pain, change in energy level, palpitations, or other symptoms. She reports she is generally feeling well.  Outpatient Encounter Prescriptions as of 08/04/2012  Medication Sig Dispense Refill  . albuterol (PROVENTIL HFA;VENTOLIN HFA) 108 (90 BASE) MCG/ACT inhaler Inhale 2 puffs into the lungs every 6 (six) hours as needed.  18 g  6  . allopurinol (ZYLOPRIM) 100 MG tablet       . deferasirox (EXJADE) 500 MG disintegrating tablet Take by mouth daily before breakfast.      . folic acid (FOLVITE) 1 MG tablet Take 1 mg by mouth daily.      . furosemide (LASIX) 20 MG tablet Take 1 tablet (20 mg total) by mouth 2 (two) times daily.  60 tablet  1  . hydroxyurea (HYDREA) 500 MG capsule       . KIONEX 15 GM/60ML suspension       . ramipril (ALTACE) 5 MG capsule Take 5 mg by mouth daily.      . potassium chloride (K-DUR) 10 MEQ tablet Take 10 mEq by mouth daily.       No facility-administered encounter medications on file as of 08/04/2012.   BP 130/60  Pulse 80  Temp(Src) 98 F (36.7 C) (Oral)  Wt 179 lb (81.194 kg)  BMI 29.79 kg/m2  SpO2 93%  Review of Systems  Constitutional: Negative for fever, chills, appetite change, fatigue and unexpected weight change.  HENT: Negative for  ear pain, congestion, sore throat, trouble swallowing, neck pain, voice change and sinus pressure.   Eyes: Negative for visual disturbance.  Respiratory: Positive for shortness of breath (chronic). Negative for cough, wheezing and stridor.   Cardiovascular: Negative for chest pain, palpitations and leg swelling.  Gastrointestinal: Negative for nausea, vomiting, abdominal pain, diarrhea, constipation, blood in stool, abdominal distention and anal bleeding.  Genitourinary: Negative for dysuria and flank pain.  Musculoskeletal: Negative for myalgias, arthralgias and gait problem.  Skin: Negative for color change and rash.  Neurological: Negative for dizziness and headaches.  Hematological: Negative for adenopathy. Does not bruise/bleed easily.  Psychiatric/Behavioral: Negative for suicidal ideas, sleep disturbance and dysphoric mood. The patient is not nervous/anxious.        Objective:   Physical Exam  Constitutional: She is oriented to person, place, and time. She appears well-developed and well-nourished. No distress.  HENT:  Head: Normocephalic and atraumatic.  Right Ear: External ear normal.  Left Ear: External ear normal.  Nose: Nose normal.  Mouth/Throat: Oropharynx is clear and moist. No oropharyngeal exudate.  Eyes: Conjunctivae are normal. Pupils are equal, round, and reactive to light. Right eye exhibits no discharge. Left eye exhibits no discharge. No scleral icterus.  Neck: Normal range of motion. Neck supple. No tracheal deviation present. No thyromegaly present.  Cardiovascular: Normal rate, regular rhythm and intact distal  pulses.  Exam reveals no gallop and no friction rub.   Murmur heard. Pulmonary/Chest: Effort normal and breath sounds normal. No accessory muscle usage. Not tachypneic. No respiratory distress. She has no wheezes. She has no rales. She exhibits no tenderness.  Musculoskeletal: Normal range of motion. She exhibits no edema and no tenderness.   Lymphadenopathy:    She has no cervical adenopathy.  Neurological: She is alert and oriented to person, place, and time. No cranial nerve deficit. She exhibits normal muscle tone. Coordination normal.  Skin: Skin is warm and dry. No rash noted. She is not diaphoretic. No erythema. No pallor.  Psychiatric: She has a normal mood and affect. Her behavior is normal. Judgment and thought content normal.          Assessment & Plan:

## 2012-08-04 NOTE — Assessment & Plan Note (Signed)
Per pt decision made to monitor for now with no plans for resection. Will request recent notes on evaluation from her cardiologist.

## 2012-08-04 NOTE — Assessment & Plan Note (Signed)
Pt was referred for abnormal CXR which showed enlarged pulmonary vessels consistent with pulmonary HTN. Will request notes on recent ECHO performed by her cardiologist. 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 Will follow up with pt after ECHO reviewed. Follow up 4 weeks.

## 2012-08-12 LAB — HM MAMMOGRAPHY: HM Mammogram: NORMAL

## 2012-08-30 ENCOUNTER — Ambulatory Visit: Payer: Self-pay | Admitting: Internal Medicine

## 2012-08-31 LAB — CANCER CENTER HEMOGLOBIN: HGB: 6.2 g/dL — ABNORMAL LOW (ref 12.0–16.0)

## 2012-09-07 LAB — CANCER CENTER HEMOGLOBIN: HGB: 8 g/dL — ABNORMAL LOW (ref 12.0–16.0)

## 2012-09-08 ENCOUNTER — Encounter: Payer: Self-pay | Admitting: Internal Medicine

## 2012-09-14 LAB — CANCER CENTER HEMOGLOBIN: HGB: 7.5 g/dL — ABNORMAL LOW (ref 12.0–16.0)

## 2012-09-29 ENCOUNTER — Ambulatory Visit: Payer: Self-pay | Admitting: Internal Medicine

## 2012-10-05 LAB — CBC CANCER CENTER
Basophil #: 0.1 "x10 3/mm "
Basophil %: 1.4 %
Eosinophil #: 0.2 "x10 3/mm "
Eosinophil %: 2.7 %
HCT: 19.4 % — ABNORMAL LOW
HGB: 6.9 g/dL — ABNORMAL LOW
Lymphocyte %: 38 %
Lymphs Abs: 2.8 "x10 3/mm "
MCH: 34.9 pg — ABNORMAL HIGH
MCHC: 35.7 g/dL
MCV: 98 fL
Monocyte #: 1.1 "x10 3/mm " — ABNORMAL HIGH
Monocyte %: 15 %
Neutrophil #: 3.2 "x10 3/mm "
Neutrophil %: 42.9 %
Platelet: 187 "x10 3/mm "
RBC: 1.99 "x10 6/mm " — ABNORMAL LOW
RDW: 21.9 % — ABNORMAL HIGH
WBC: 7.5 "x10 3/mm "

## 2012-10-19 LAB — CANCER CENTER HEMOGLOBIN: HGB: 6.4 g/dL — ABNORMAL LOW (ref 12.0–16.0)

## 2012-10-26 LAB — CANCER CENTER HEMOGLOBIN: HGB: 6.2 g/dL — ABNORMAL LOW

## 2012-10-30 ENCOUNTER — Ambulatory Visit: Payer: Self-pay | Admitting: Internal Medicine

## 2012-11-21 ENCOUNTER — Ambulatory Visit: Payer: Self-pay | Admitting: Gastroenterology

## 2012-11-23 LAB — CANCER CENTER HEMOGLOBIN: HGB: 6 g/dL — ABNORMAL LOW (ref 12.0–16.0)

## 2012-11-23 LAB — PATHOLOGY REPORT

## 2012-11-29 ENCOUNTER — Ambulatory Visit: Payer: Self-pay | Admitting: Internal Medicine

## 2012-11-30 LAB — CANCER CENTER HEMOGLOBIN: HGB: 8.1 g/dL — ABNORMAL LOW (ref 12.0–16.0)

## 2012-12-07 LAB — CANCER CENTER HEMOGLOBIN: HGB: 7.6 g/dL — ABNORMAL LOW (ref 12.0–16.0)

## 2012-12-12 LAB — HM COLONOSCOPY

## 2012-12-14 LAB — CANCER CENTER HEMOGLOBIN: HGB: 7.4 g/dL — ABNORMAL LOW (ref 12.0–16.0)

## 2012-12-21 ENCOUNTER — Encounter: Payer: Self-pay | Admitting: Internal Medicine

## 2012-12-21 LAB — FERRITIN: Ferritin (ARMC): 445 ng/mL — ABNORMAL HIGH (ref 8–388)

## 2012-12-21 LAB — IRON AND TIBC
Iron Bind.Cap.(Total): 280 ug/dL (ref 250–450)
Iron Saturation: 33 %
Iron: 91 ug/dL (ref 50–170)
Unbound Iron-Bind.Cap.: 189 ug/dL

## 2012-12-21 LAB — CANCER CENTER HEMOGLOBIN: HGB: 7.3 g/dL — ABNORMAL LOW (ref 12.0–16.0)

## 2012-12-28 LAB — CBC CANCER CENTER
Basophil %: 1.1 %
Eosinophil %: 1.9 %
HGB: 7.3 g/dL — ABNORMAL LOW (ref 12.0–16.0)
Lymphocyte #: 2.2 x10 3/mm (ref 1.0–3.6)
MCH: 33.3 pg (ref 26.0–34.0)
MCHC: 36 g/dL (ref 32.0–36.0)
MCV: 93 fL (ref 80–100)
Monocyte #: 1.3 x10 3/mm — ABNORMAL HIGH (ref 0.2–0.9)
Monocyte %: 15.2 %
Neutrophil #: 4.8 x10 3/mm (ref 1.4–6.5)
Platelet: 203 x10 3/mm (ref 150–440)
WBC: 8.5 x10 3/mm (ref 3.6–11.0)

## 2012-12-30 ENCOUNTER — Ambulatory Visit: Payer: Self-pay | Admitting: Internal Medicine

## 2013-01-11 LAB — CREATININE, SERUM: Creatinine: 1.9 mg/dL — ABNORMAL HIGH (ref 0.60–1.30)

## 2013-01-11 LAB — HEPATIC FUNCTION PANEL A (ARMC)
Alkaline Phosphatase: 138 U/L — ABNORMAL HIGH (ref 50–136)
Bilirubin, Direct: 1.2 mg/dL — ABNORMAL HIGH (ref 0.00–0.20)
SGOT(AST): 43 U/L — ABNORMAL HIGH (ref 15–37)

## 2013-01-11 LAB — CANCER CENTER HEMOGLOBIN: HGB: 6.4 g/dL — ABNORMAL LOW (ref 12.0–16.0)

## 2013-01-30 ENCOUNTER — Ambulatory Visit: Payer: Self-pay | Admitting: Internal Medicine

## 2013-02-08 LAB — CANCER CENTER HEMOGLOBIN: HGB: 7 g/dL — ABNORMAL LOW (ref 12.0–16.0)

## 2013-02-13 ENCOUNTER — Ambulatory Visit: Payer: Self-pay | Admitting: Vascular Surgery

## 2013-02-22 LAB — CANCER CENTER HEMOGLOBIN: HGB: 6.5 g/dL — ABNORMAL LOW (ref 12.0–16.0)

## 2013-02-24 ENCOUNTER — Encounter: Payer: Self-pay | Admitting: Internal Medicine

## 2013-03-01 ENCOUNTER — Ambulatory Visit: Payer: Self-pay | Admitting: Internal Medicine

## 2013-03-08 ENCOUNTER — Ambulatory Visit: Payer: Self-pay | Admitting: Nephrology

## 2013-03-08 LAB — CANCER CENTER HEMOGLOBIN: HGB: 6.5 g/dL — ABNORMAL LOW (ref 12.0–16.0)

## 2013-03-22 LAB — IRON AND TIBC
Iron Bind.Cap.(Total): 281 ug/dL (ref 250–450)
Iron Saturation: 32 %
Iron: 91 ug/dL (ref 50–170)
Unbound Iron-Bind.Cap.: 190 ug/dL

## 2013-03-22 LAB — HEPATIC FUNCTION PANEL A (ARMC)
Alkaline Phosphatase: 139 U/L — ABNORMAL HIGH (ref 50–136)
Bilirubin, Direct: 1.1 mg/dL — ABNORMAL HIGH (ref 0.00–0.20)
SGOT(AST): 48 U/L — ABNORMAL HIGH (ref 15–37)
SGPT (ALT): 25 U/L (ref 12–78)
Total Protein: 7.1 g/dL (ref 6.4–8.2)

## 2013-03-22 LAB — CANCER CENTER HEMOGLOBIN: HGB: 6 g/dL — ABNORMAL LOW (ref 12.0–16.0)

## 2013-03-22 LAB — CREATININE, SERUM: Creatinine: 1.36 mg/dL — ABNORMAL HIGH (ref 0.60–1.30)

## 2013-03-22 LAB — FERRITIN: Ferritin (ARMC): 382 ng/mL (ref 8–388)

## 2013-04-01 ENCOUNTER — Ambulatory Visit: Payer: Self-pay | Admitting: Internal Medicine

## 2013-04-12 LAB — CBC CANCER CENTER
Basophil %: 2 %
Eosinophil %: 2.8 %
HCT: 17.7 % — ABNORMAL LOW (ref 35.0–47.0)
HGB: 6.1 g/dL — ABNORMAL LOW (ref 12.0–16.0)
Lymphocyte #: 3.7 x10 3/mm — ABNORMAL HIGH (ref 1.0–3.6)
MCH: 33.4 pg (ref 26.0–34.0)
MCV: 97 fL (ref 80–100)
Monocyte %: 12.8 %
Neutrophil #: 3.7 x10 3/mm (ref 1.4–6.5)
Neutrophil %: 41 %
Platelet: 254 x10 3/mm (ref 150–440)
WBC: 9 x10 3/mm (ref 3.6–11.0)

## 2013-04-19 LAB — CANCER CENTER HEMOGLOBIN: HGB: 6.7 g/dL — ABNORMAL LOW (ref 12.0–16.0)

## 2013-04-25 LAB — CBC CANCER CENTER
HGB: 5.8 g/dL — ABNORMAL LOW (ref 12.0–16.0)
MCH: 34.7 pg — ABNORMAL HIGH (ref 26.0–34.0)
MCHC: 35 g/dL (ref 32.0–36.0)
Monocytes: 12 %
Platelet: 213 x10 3/mm (ref 150–440)
RBC: 1.68 10*6/uL — ABNORMAL LOW (ref 3.80–5.20)
Segmented Neutrophils: 57 %
WBC: 7.8 x10 3/mm (ref 3.6–11.0)

## 2013-05-01 ENCOUNTER — Ambulatory Visit: Payer: Self-pay | Admitting: Internal Medicine

## 2013-05-31 LAB — CANCER CENTER HEMOGLOBIN: HGB: 5.8 g/dL — ABNORMAL LOW (ref 12.0–16.0)

## 2013-06-01 ENCOUNTER — Ambulatory Visit: Payer: Self-pay | Admitting: Internal Medicine

## 2013-06-07 LAB — CANCER CENTER HEMOGLOBIN: HGB: 6.6 g/dL — AB (ref 12.0–16.0)

## 2013-06-21 LAB — CANCER CENTER HEMOGLOBIN: HGB: 6 g/dL — ABNORMAL LOW (ref 12.0–16.0)

## 2013-07-02 ENCOUNTER — Ambulatory Visit: Payer: Self-pay | Admitting: Internal Medicine

## 2013-07-05 LAB — CBC CANCER CENTER
BASOS ABS: 0 x10 3/mm (ref 0.0–0.1)
Basophil %: 0.3 %
EOS ABS: 0.3 x10 3/mm (ref 0.0–0.7)
Eosinophil %: 2.6 %
HCT: 16.4 % — ABNORMAL LOW (ref 35.0–47.0)
HGB: 5.8 g/dL — AB (ref 12.0–16.0)
Lymphocyte #: 3.9 x10 3/mm — ABNORMAL HIGH (ref 1.0–3.6)
Lymphocyte %: 39 %
MCH: 34 pg (ref 26.0–34.0)
MCHC: 35.1 g/dL (ref 32.0–36.0)
MCV: 97 fL (ref 80–100)
Monocyte #: 1.5 x10 3/mm — ABNORMAL HIGH (ref 0.2–0.9)
Monocyte %: 14.4 %
Neutrophil #: 4.4 x10 3/mm (ref 1.4–6.5)
Neutrophil %: 43.7 %
PLATELETS: 222 x10 3/mm (ref 150–440)
RBC: 1.69 10*6/uL — ABNORMAL LOW (ref 3.80–5.20)
RDW: 28.1 % — ABNORMAL HIGH (ref 11.5–14.5)
WBC: 10.1 x10 3/mm (ref 3.6–11.0)

## 2013-07-12 LAB — CANCER CENTER HEMOGLOBIN: HGB: 6.5 g/dL — AB (ref 12.0–16.0)

## 2013-07-19 LAB — CANCER CENTER HEMOGLOBIN: HGB: 6.5 g/dL — ABNORMAL LOW (ref 12.0–16.0)

## 2013-07-26 LAB — CANCER CENTER HEMOGLOBIN: HGB: 5.7 g/dL — AB (ref 12.0–16.0)

## 2013-07-30 ENCOUNTER — Ambulatory Visit: Payer: Self-pay | Admitting: Internal Medicine

## 2013-08-02 LAB — CANCER CENTER HEMOGLOBIN: HGB: 5.9 g/dL — ABNORMAL LOW (ref 12.0–16.0)

## 2013-08-09 LAB — CANCER CENTER HEMOGLOBIN: HGB: 5.4 g/dL — AB (ref 12.0–16.0)

## 2013-08-16 LAB — CANCER CENTER HEMOGLOBIN: HGB: 5.7 g/dL — AB (ref 12.0–16.0)

## 2013-08-21 ENCOUNTER — Ambulatory Visit: Payer: Self-pay | Admitting: Vascular Surgery

## 2013-08-23 LAB — CANCER CENTER HEMOGLOBIN: HGB: 7.3 g/dL — ABNORMAL LOW (ref 12.0–16.0)

## 2013-08-30 ENCOUNTER — Ambulatory Visit: Payer: Self-pay | Admitting: Internal Medicine

## 2013-09-13 LAB — CANCER CENTER HEMOGLOBIN: HGB: 5.8 g/dL — AB (ref 12.0–16.0)

## 2013-09-20 LAB — IRON AND TIBC
IRON: 52 ug/dL (ref 50–170)
Iron Bind.Cap.(Total): 251 ug/dL (ref 250–450)
Iron Saturation: 21 %
UNBOUND IRON-BIND. CAP.: 199 ug/dL

## 2013-09-20 LAB — CANCER CENTER HEMOGLOBIN: HGB: 5.7 g/dL — ABNORMAL LOW (ref 12.0–16.0)

## 2013-09-20 LAB — FERRITIN: FERRITIN (ARMC): 723 ng/mL — AB (ref 8–388)

## 2013-09-27 LAB — CBC CANCER CENTER
EOS PCT: 5 %
HCT: 17.3 % — ABNORMAL LOW (ref 35.0–47.0)
HGB: 6.1 g/dL — ABNORMAL LOW (ref 12.0–16.0)
Lymphocytes: 28 %
MCH: 32.9 pg (ref 26.0–34.0)
MCHC: 35.5 g/dL (ref 32.0–36.0)
MCV: 93 fL (ref 80–100)
Monocytes: 11 %
PLATELETS: 216 x10 3/mm (ref 150–440)
RBC: 1.86 10*6/uL — ABNORMAL LOW (ref 3.80–5.20)
RDW: 23.2 % — AB (ref 11.5–14.5)
SEGMENTED NEUTROPHILS: 56 %
WBC: 9.2 x10 3/mm (ref 3.6–11.0)

## 2013-09-29 ENCOUNTER — Ambulatory Visit: Payer: Self-pay | Admitting: Internal Medicine

## 2013-10-04 LAB — CANCER CENTER HEMOGLOBIN: HGB: 6.2 g/dL — ABNORMAL LOW (ref 12.0–16.0)

## 2013-10-11 LAB — CANCER CENTER HEMOGLOBIN: HGB: 6.1 g/dL — AB (ref 12.0–16.0)

## 2013-10-18 LAB — CANCER CENTER HEMOGLOBIN: HGB: 6.1 g/dL — ABNORMAL LOW (ref 12.0–16.0)

## 2013-10-25 LAB — CANCER CENTER HEMOGLOBIN: HGB: 6.4 g/dL — ABNORMAL LOW (ref 12.0–16.0)

## 2013-10-30 ENCOUNTER — Ambulatory Visit: Payer: Self-pay | Admitting: Internal Medicine

## 2013-11-08 LAB — CANCER CENTER HEMOGLOBIN: HGB: 5.5 g/dL — ABNORMAL LOW (ref 12.0–16.0)

## 2013-11-15 LAB — CANCER CENTER HEMOGLOBIN: HGB: 6.4 g/dL — ABNORMAL LOW (ref 12.0–16.0)

## 2013-11-22 LAB — CANCER CENTER HEMOGLOBIN: HGB: 6 g/dL — AB (ref 12.0–16.0)

## 2013-11-29 ENCOUNTER — Ambulatory Visit: Payer: Self-pay | Admitting: Internal Medicine

## 2013-11-29 LAB — CANCER CENTER HEMOGLOBIN: HGB: 6 g/dL — AB (ref 12.0–16.0)

## 2013-12-06 ENCOUNTER — Emergency Department: Payer: Self-pay | Admitting: Emergency Medicine

## 2013-12-12 ENCOUNTER — Encounter: Payer: Self-pay | Admitting: Internal Medicine

## 2013-12-12 ENCOUNTER — Ambulatory Visit (INDEPENDENT_AMBULATORY_CARE_PROVIDER_SITE_OTHER): Payer: BC Managed Care – PPO | Admitting: Internal Medicine

## 2013-12-12 VITALS — BP 112/58 | HR 71 | Temp 98.2°F | Ht 65.0 in | Wt 177.0 lb

## 2013-12-12 DIAGNOSIS — Z1239 Encounter for other screening for malignant neoplasm of breast: Secondary | ICD-10-CM

## 2013-12-12 DIAGNOSIS — N289 Disorder of kidney and ureter, unspecified: Secondary | ICD-10-CM

## 2013-12-12 DIAGNOSIS — Z23 Encounter for immunization: Secondary | ICD-10-CM

## 2013-12-12 DIAGNOSIS — M25552 Pain in left hip: Secondary | ICD-10-CM | POA: Insufficient documentation

## 2013-12-12 DIAGNOSIS — D571 Sickle-cell disease without crisis: Secondary | ICD-10-CM

## 2013-12-12 DIAGNOSIS — M25559 Pain in unspecified hip: Secondary | ICD-10-CM

## 2013-12-12 NOTE — Progress Notes (Signed)
Subjective:    Patient ID: Jane Short, female    DOB: [birth year], 63 y.o.   MRN: [id number]  HPI 63YO female presents for hospital follow up.  Left hip pain - Severe last week. Went to ED on Wednesday. Xray of left hip was normal. Had trouble walking because of pain. Pain is now improved. She suspects pain was secondary to sickle crisis. Has not taken Tramadol for last 2 days.  Sickle Cell Anemia - Hgb recently 4. Had transfusion of PRBC on Monday. Hydroxyurea was stopped.  Chronic kidney disease - GFR recently 23. Starting talks about dialysis with Dr. Rolly Salter.  Review of Systems  Constitutional: Negative for fever, chills, appetite change, fatigue and unexpected weight change.  Eyes: Negative for visual disturbance.  Respiratory: Negative for shortness of breath.   Cardiovascular: Negative for chest pain and leg swelling.  Gastrointestinal: Negative for nausea, vomiting, abdominal pain, diarrhea and constipation.  Musculoskeletal: Positive for arthralgias and myalgias. Negative for back pain, gait problem and joint swelling.  Skin: Negative for color change and rash.  Hematological: Negative for adenopathy. Does not bruise/bleed easily.  Psychiatric/Behavioral: Negative for dysphoric mood. The patient is not nervous/anxious.        Objective:    BP 112/58  Pulse 71  Temp(Src) 98.2 F (36.8 C) (Oral)  Ht 5\' 5"  (1.651 m)  Wt 177 lb (80.287 kg)  BMI 29.45 kg/m2  SpO2 98% Physical Exam  Constitutional: She is oriented to person, place, and time. She appears well-developed and well-nourished. No distress.  HENT:  Head: Normocephalic and atraumatic.  Right Ear: External ear normal.  Left Ear: External ear normal.  Nose: Nose normal.  Mouth/Throat: Oropharynx is clear and moist. No oropharyngeal exudate.  Eyes: Conjunctivae are normal. Pupils are equal, round, and reactive to light. Right eye exhibits no discharge. Left eye exhibits no discharge. No scleral icterus.    Neck: Normal range of motion. Neck supple. No tracheal deviation present. No thyromegaly present.  Cardiovascular: Normal rate, regular rhythm, normal heart sounds and intact distal pulses.  Exam reveals no gallop and no friction rub.   No murmur heard. Pulmonary/Chest: Effort normal and breath sounds normal. No accessory muscle usage. Not tachypneic. No respiratory distress. She has no decreased breath sounds. She has no wheezes. She has no rhonchi. She has no rales. She exhibits no tenderness.  Musculoskeletal: She exhibits no edema and no tenderness.       Left hip: She exhibits decreased range of motion. She exhibits no tenderness.  Lymphadenopathy:    She has no cervical adenopathy.  Neurological: She is alert and oriented to person, place, and time. No cranial nerve deficit. She exhibits normal muscle tone. Coordination normal.  Skin: Skin is warm and dry. No rash noted. She is not diaphoretic. No erythema. No pallor.  Psychiatric: She has a normal mood and affect. Her behavior is normal. Judgment and thought content normal.          Assessment & Plan:   Problem List Items Addressed This Visit     Unprioritized   Left hip pain - Primary     Recent severe left hip pain likely secondary to sickle cell crisis. Symptoms improved with transfusion. Will continue Tramadol prn. Follow up prn.    Renal insufficiency     Will request notes from nephrology as pt reports decline in GFR recently.    Screening for breast cancer   Relevant Orders      MM Digital Screening  Sickle cell anemia     Will request notes from hematology. Generally doing well, symptoms of bony pain improved after recent transfusion. Will give Prevnar today.        Return in about 3 months (around 03/14/2014) for Recheck.

## 2013-12-12 NOTE — Progress Notes (Signed)
Pre visit review using our clinic review tool, if applicable. No additional management support is needed unless otherwise documented below in the visit note. 

## 2013-12-12 NOTE — Assessment & Plan Note (Signed)
Will request notes from nephrology as pt reports decline in GFR recently.

## 2013-12-12 NOTE — Assessment & Plan Note (Signed)
Will request notes from hematology. Generally doing well, symptoms of bony pain improved after recent transfusion. Will give Prevnar today.

## 2013-12-12 NOTE — Addendum Note (Signed)
Addended by: Vernetta Honey on: 12/12/2013 01:42 PM   Modules accepted: Orders

## 2013-12-12 NOTE — Assessment & Plan Note (Signed)
Recent severe left hip pain likely secondary to sickle cell crisis. Symptoms improved with transfusion. Will continue Tramadol prn. Follow up prn.

## 2013-12-12 NOTE — Patient Instructions (Signed)
Continue current medications.  We will set up a physical exam in 3 months.

## 2013-12-13 ENCOUNTER — Telehealth: Payer: Self-pay | Admitting: Internal Medicine

## 2013-12-13 LAB — FOLATE: FOLIC ACID: 37.7 ng/mL (ref 3.1–100.0)

## 2013-12-13 LAB — IRON AND TIBC
IRON: 115 ug/dL (ref 50–170)
Iron Bind.Cap.(Total): 686 ug/dL — ABNORMAL HIGH (ref 250–450)

## 2013-12-13 LAB — FERRITIN: FERRITIN (ARMC): 174 ng/mL (ref 8–388)

## 2013-12-13 LAB — CANCER CENTER HEMOGLOBIN: HGB: 7.2 g/dL — ABNORMAL LOW (ref 12.0–16.0)

## 2013-12-13 NOTE — Telephone Encounter (Signed)
Relevant patient education assigned to patient using Emmi. ° °

## 2013-12-20 LAB — CBC CANCER CENTER
Basophil #: 0.1 x10 3/mm (ref 0.0–0.1)
Basophil %: 1.7 %
EOS PCT: 3.6 %
Eosinophil #: 0.3 x10 3/mm (ref 0.0–0.7)
HCT: 18.2 % — ABNORMAL LOW (ref 35.0–47.0)
HGB: 6.2 g/dL — AB (ref 12.0–16.0)
LYMPHS PCT: 31.8 %
Lymphocyte #: 2.5 x10 3/mm (ref 1.0–3.6)
MCH: 32.1 pg (ref 26.0–34.0)
MCHC: 34 g/dL (ref 32.0–36.0)
MCV: 94 fL (ref 80–100)
MONO ABS: 1.1 x10 3/mm — AB (ref 0.2–0.9)
Monocyte %: 13.6 %
NEUTROS ABS: 3.8 x10 3/mm (ref 1.4–6.5)
NEUTROS PCT: 49.3 %
PLATELETS: 342 x10 3/mm (ref 150–440)
RBC: 1.92 10*6/uL — ABNORMAL LOW (ref 3.80–5.20)
RDW: 21.7 % — ABNORMAL HIGH (ref 11.5–14.5)
WBC: 7.8 x10 3/mm (ref 3.6–11.0)

## 2013-12-20 LAB — OCCULT BLOOD X 1 CARD TO LAB, STOOL
OCCULT BLOOD, FECES: NEGATIVE
Occult Blood, Feces: NEGATIVE

## 2013-12-22 LAB — PROT IMMUNOELECTROPHORES(ARMC)

## 2013-12-27 LAB — CANCER CENTER HEMOGLOBIN: HGB: 5.8 g/dL — ABNORMAL LOW (ref 12.0–16.0)

## 2013-12-30 ENCOUNTER — Ambulatory Visit: Payer: Self-pay | Admitting: Internal Medicine

## 2014-01-03 LAB — CANCER CENTER HEMOGLOBIN: HGB: 6.1 g/dL — ABNORMAL LOW (ref 12.0–16.0)

## 2014-01-24 LAB — CANCER CENTER HEMOGLOBIN: HGB: 5.7 g/dL — ABNORMAL LOW (ref 12.0–16.0)

## 2014-01-30 ENCOUNTER — Ambulatory Visit: Payer: Self-pay | Admitting: Internal Medicine

## 2014-01-31 LAB — CANCER CENTER HEMOGLOBIN: HGB: 5.7 g/dL — ABNORMAL LOW (ref 12.0–16.0)

## 2014-02-07 LAB — CANCER CENTER HEMOGLOBIN: HGB: 5.8 g/dL — AB (ref 12.0–16.0)

## 2014-02-14 LAB — CANCER CENTER HEMOGLOBIN: HGB: 5.4 g/dL — ABNORMAL LOW (ref 12.0–16.0)

## 2014-02-28 LAB — CANCER CENTER HEMOGLOBIN: HGB: 6.5 g/dL — ABNORMAL LOW (ref 12.0–16.0)

## 2014-02-28 LAB — FERRITIN: Ferritin (ARMC): 110 ng/mL (ref 8–388)

## 2014-02-28 LAB — IRON AND TIBC
IRON BIND. CAP.(TOTAL): 532 ug/dL — AB (ref 250–450)
IRON SATURATION: 17 %
Iron: 92 ug/dL (ref 50–170)
UNBOUND IRON-BIND. CAP.: 440 ug/dL

## 2014-03-01 ENCOUNTER — Ambulatory Visit: Payer: Self-pay | Admitting: Internal Medicine

## 2014-03-07 LAB — CANCER CENTER HEMOGLOBIN: HGB: 6.8 g/dL — ABNORMAL LOW (ref 12.0–16.0)

## 2014-03-14 ENCOUNTER — Ambulatory Visit: Payer: BC Managed Care – PPO | Admitting: Internal Medicine

## 2014-03-14 LAB — CANCER CENTER HEMOGLOBIN: HGB: 6.9 g/dL — ABNORMAL LOW (ref 12.0–16.0)

## 2014-03-15 ENCOUNTER — Ambulatory Visit: Payer: BC Managed Care – PPO | Admitting: Internal Medicine

## 2014-03-21 LAB — CANCER CENTER HEMOGLOBIN: HGB: 6.7 g/dL — ABNORMAL LOW (ref 12.0–16.0)

## 2014-04-01 ENCOUNTER — Ambulatory Visit: Payer: Self-pay | Admitting: Internal Medicine

## 2014-04-04 LAB — CANCER CENTER HEMOGLOBIN: HGB: 6.1 g/dL — AB (ref 12.0–16.0)

## 2014-04-18 LAB — CANCER CENTER HEMOGLOBIN: HGB: 6.3 g/dL — ABNORMAL LOW (ref 12.0–16.0)

## 2014-04-25 LAB — CANCER CENTER HEMOGLOBIN: HGB: 6 g/dL — ABNORMAL LOW (ref 12.0–16.0)

## 2014-05-01 ENCOUNTER — Ambulatory Visit: Payer: Self-pay | Admitting: Internal Medicine

## 2014-05-02 LAB — CANCER CENTER HEMOGLOBIN: HGB: 5.9 g/dL — ABNORMAL LOW (ref 12.0–16.0)

## 2014-05-09 LAB — CANCER CENTER HEMOGLOBIN: HGB: 5.6 g/dL — ABNORMAL LOW (ref 12.0–16.0)

## 2014-05-16 LAB — CANCER CENTER HEMOGLOBIN: HGB: 5.7 g/dL — AB (ref 12.0–16.0)

## 2014-05-23 LAB — CANCER CENTER HEMOGLOBIN: HGB: 8.2 g/dL — ABNORMAL LOW (ref 12.0–16.0)

## 2014-05-30 LAB — IRON AND TIBC
IRON: 127 ug/dL (ref 50–170)
Iron Bind.Cap.(Total): 306 ug/dL (ref 250–450)
Iron Saturation: 42 %
Unbound Iron-Bind.Cap.: 179 ug/dL

## 2014-05-30 LAB — FERRITIN: Ferritin (ARMC): 175 ng/mL (ref 8–388)

## 2014-05-30 LAB — CANCER CENTER HEMOGLOBIN: HGB: 7.1 g/dL — ABNORMAL LOW (ref 12.0–16.0)

## 2014-06-01 ENCOUNTER — Ambulatory Visit: Payer: Self-pay | Admitting: Internal Medicine

## 2014-06-06 LAB — CANCER CENTER HEMOGLOBIN: HGB: 7.2 g/dL — ABNORMAL LOW (ref 12.0–16.0)

## 2014-06-13 LAB — CANCER CENTER HEMOGLOBIN: HGB: 6.9 g/dL — ABNORMAL LOW (ref 12.0–16.0)

## 2014-06-27 LAB — CBC CANCER CENTER
Eosinophil: 1 %
HCT: 19.8 % — AB (ref 35.0–47.0)
HGB: 6.7 g/dL — AB (ref 12.0–16.0)
LYMPHS PCT: 39 %
MCH: 36.3 pg — ABNORMAL HIGH (ref 26.0–34.0)
MCHC: 34.1 g/dL (ref 32.0–36.0)
MCV: 107 fL — ABNORMAL HIGH (ref 80–100)
METAMYELOCYTE: 2 %
MONOS PCT: 12 %
NRBC/100 WBC: 19 /100
Platelet: 223 x10 3/mm (ref 150–440)
RBC: 1.86 10*6/uL — ABNORMAL LOW (ref 3.80–5.20)
RDW: 24.5 % — ABNORMAL HIGH (ref 11.5–14.5)
Segmented Neutrophils: 46 %
WBC: 7.4 x10 3/mm (ref 3.6–11.0)

## 2014-07-02 ENCOUNTER — Ambulatory Visit: Payer: Self-pay | Admitting: Internal Medicine

## 2014-07-04 LAB — CANCER CENTER HEMOGLOBIN: HGB: 6.5 g/dL — ABNORMAL LOW (ref 12.0–16.0)

## 2014-07-31 ENCOUNTER — Ambulatory Visit
Admit: 2014-07-31 | Disposition: A | Discharge status: Home / Self Care | Payer: Self-pay | Attending: Internal Medicine | Admitting: Internal Medicine

## 2014-08-29 LAB — CANCER CENTER HEMOGLOBIN: HGB: 6.1 g/dL — AB (ref 12.0–16.0)

## 2014-08-31 ENCOUNTER — Ambulatory Visit
Admit: 2014-08-31 | Disposition: A | Discharge status: Home / Self Care | Payer: Self-pay | Attending: Internal Medicine | Admitting: Internal Medicine

## 2014-09-12 LAB — CANCER CENTER HEMOGLOBIN: HGB: 6.1 g/dL — ABNORMAL LOW (ref 12.0–16.0)

## 2014-09-21 NOTE — Op Note (Signed)
PATIENT NAME:  Jane Short, Jane Short MR#:  [id number] DATE OF BIRTH:  06/07/1950  DATE OF PROCEDURE:  02/13/2013  PREOPERATIVE DIAGNOSES: 1.  Chronic anemia requiring frequent blood transfusions and blood draws.  2.  Sickle cell disease.   POSTOPERATIVE DIAGNOSES:  1.  Chronic anemia requiring frequent blood transfusions and blood draws.  2.  Sickle cell disease.   PROCEDURES:  1.  Ultrasound guidance for vascular access, right internal jugular vein.  2.  Fluoroscopic guidance for placement of catheter.  3.  Placement of a double-lumen Port-A-Cath, right internal jugular vein.   SURGEON: Algernon Huxley, MD  ANESTHESIA: Local with sedation.   BLOOD LOSS: 25 mL.   INDICATION FOR PROCEDURE: A 64 year old African American female with sickle cell disease, chronic anemia and requirement of frequent blood transfusions, blood draws and pain medications. We are asked to place a double lumen Port-A- Cath. Risks and benefits were discussed. Informed consent was obtained.   DESCRIPTION OF THE PROCEDURE:  The patient was brought to the vascular and interventional radiology suite. The right neck and chest were sterilely prepped and draped, and a sterile surgical field was created. Ultrasound was used to help visualize a patent right internal jugular vein. This was then accessed under direct ultrasound guidance without difficulty with a Seldinger needle and a permanent image was recorded. A J-wire was placed. After skin nick and dilatation, the peel-away sheath was then placed over the wire. I then anesthetized an area under the clavicle approximately 2 fingerbreadths. A transverse incision was created and an inferior pocket was created with electrocautery and blunt dissection. The port was then brought onto the field, placed into the pocket and secured to the chest wall with 2 Prolene sutures. The catheter was connected to the port and tunneled from the subclavicular incision to the access site. Fluoroscopic  guidance was used to cut the catheter to an appropriate length. The catheter was then placed through the peel-away sheath and the peel-away sheath was removed. The catheter tip was parked in excellent location in the cavoatrial junction. The pocket was then irrigated with antibiotic-impregnated saline and the wound was closed with a running 3-0 Vicryl and a 4-0 Monocryl. The access incision was closed with a single 4-0 Monocryl. The Huber needle was used to withdraw blood and flush the port with heparinized saline. Dermabond was then placed as a dressing. The patient tolerated the procedure well and was taken to the recovery room in stable condition.  ___________________________ Algernon Huxley, MD jsd:sb D: 02/13/2013 10:00:16 ET T: 02/13/2013 10:36:23 ET JOB#: [id number]  cc: Algernon Huxley, MD, <Dictator> Algernon Huxley MD ELECTRONICALLY SIGNED 02/22/2013 11:38

## 2014-09-22 NOTE — Op Note (Signed)
PATIENT NAME:  Jane Short, Jane Short MR#:  [id number] DATE OF BIRTH:  July 07, 1950  DATE OF PROCEDURE:  08/21/2013  PREOPERATIVE DIAGNOSES: 1.  Severe anemia with sickle cell disease.  2.  Malpositioned Port-A-Cath.   POSTOPERATIVE DIAGNOSES: 1.  Severe anemia with sickle cell disease.  2.  Malpositioned Port-A-Cath.   PROCEDURES: 1.  Catheter placement in superior vena cava and superior venacavogram.  2.  Replacement of catheter of right jugular Port-A-Cath with fluoro and ultrasound guidance.   SURGEON: Algernon Huxley, MD   ANESTHESIA: Local with moderate conscious sedation.   ESTIMATED BLOOD LOSS: 25 mL.  FLUOROSCOPY TIME: Approximately 7 minutes.   CONTRAST USED: 5 mL.   INDICATION FOR PROCEDURE: This is a 64 year old African American female with sickle cell disease and severe anemia. I placed a double-lumen Port-A-Cath last year. This had been working well until recently, and a recent port study showed the catheter had coiled up into the jugular vein, with the catheter tip now being supraclavicular, not in a good location for long-term use. I recommended revision of his port. Risks and benefits were discussed and informed consent was obtained.   DESCRIPTION OF PROCEDURE: The patient was brought to the vascular suite. The right neck, chest, and existing area of previous port placement were prepped and draped and a sterile surgical field was created. I cut down on the hub of the Port-A-Cath and transected the catheter. I could get a wire through there reasonably easily that went superior in the jugular for about 5 cm before curving and then going inferior in the jugular. This remained intraluminal. I exchanged for multiple wires in attempt to straighten out the catheter, including an Advantage wire with stiff and 0.035 hub using a Kumpe catheter. Despite multiple wires and maneuvers, there remained a large coil in the jugular vein that I did not want to leave in place for a permanent indwelling  access for fear of thrombosis. With the Kumpe catheter in the atrium and superior vena cava to confirm that I was intraluminal and to confirm that there was no superior vena cava stenosis that was identifiable, and there was not, I elected to re-access the jugular vein just above the clavicle. This was done under ultrasound guidance without difficulty with a Seldinger needle. A J-wire was placed after skin nick and dilatation and the peel-away sheath was placed over the wire. I then tunneled from the previous port to the access incision. I removed the wire I had previously used to try to rewire the older catheter. The catheter was connected to the port in the typical fashion and secured to the chest wall with another Prolene suture, as one had to be removed to free the previous port. Then using fluoroscopic guidance, I cut the Kumpe catheter to appropriate length, placed this through the peel-away sheath, and the peel-away sheath was removed. The catheter tip was parked just into the right atrium in an excellent location with a typical gentle curve in the neck without a kink and no coiling in the jugular vein. This was in an excellent location and I was pleased with the result. Both lumens withdrew blood well and flushed easily with heparinized saline. I then closed the subclavicular incision with a 3-0 Vicryl and a 4-0 Monocryl. A single 4-0 Monocryl was used at the access site. Dermabond was placed as a dressing. The patient was awakened from anesthesia and taken to the recovery room in stable condition, having tolerated the procedure well.   ____________________________ Corene Cornea  Bunnie Domino, MD jsd:jcm D: 08/21/2013 12:03:15 ET T: 08/21/2013 14:42:46 ET JOB#: [id number]  cc: Algernon Huxley, MD, <Dictator> Sandeep R. Ma Hillock, MD Algernon Huxley MD ELECTRONICALLY SIGNED 09/04/2013 9:48

## 2014-09-26 LAB — CANCER CENTER HEMOGLOBIN: HGB: 5.8 g/dL — ABNORMAL LOW (ref 12.0–16.0)

## 2014-10-02 ENCOUNTER — Other Ambulatory Visit: Payer: Self-pay

## 2014-10-03 ENCOUNTER — Other Ambulatory Visit: Payer: Self-pay

## 2014-10-03 ENCOUNTER — Inpatient Hospital Stay: Payer: BLUE CROSS/BLUE SHIELD | Attending: Internal Medicine

## 2014-10-03 ENCOUNTER — Inpatient Hospital Stay: Payer: BLUE CROSS/BLUE SHIELD

## 2014-10-03 VITALS — BP 149/68 | HR 80 | Resp 18

## 2014-10-03 DIAGNOSIS — D631 Anemia in chronic kidney disease: Secondary | ICD-10-CM

## 2014-10-03 DIAGNOSIS — N189 Chronic kidney disease, unspecified: Secondary | ICD-10-CM | POA: Diagnosis not present

## 2014-10-03 DIAGNOSIS — D72829 Elevated white blood cell count, unspecified: Secondary | ICD-10-CM | POA: Diagnosis not present

## 2014-10-03 DIAGNOSIS — Z79899 Other long term (current) drug therapy: Secondary | ICD-10-CM | POA: Diagnosis not present

## 2014-10-03 DIAGNOSIS — D571 Sickle-cell disease without crisis: Secondary | ICD-10-CM | POA: Insufficient documentation

## 2014-10-03 DIAGNOSIS — D638 Anemia in other chronic diseases classified elsewhere: Secondary | ICD-10-CM | POA: Diagnosis not present

## 2014-10-03 DIAGNOSIS — N289 Disorder of kidney and ureter, unspecified: Secondary | ICD-10-CM

## 2014-10-03 LAB — CBC WITH DIFFERENTIAL/PLATELET
BASOS PCT: 1 %
Basophils Absolute: 0.1 10*3/uL (ref 0–0.1)
EOS PCT: 1 %
Eosinophils Absolute: 0.1 10*3/uL (ref 0–0.7)
HCT: 17.4 % — ABNORMAL LOW (ref 35.0–47.0)
HEMOGLOBIN: 6 g/dL — AB (ref 12.0–16.0)
LYMPHS ABS: 1.9 10*3/uL (ref 1.0–3.6)
Lymphocytes Relative: 20 %
MCH: 39 pg — AB (ref 26.0–34.0)
MCHC: 34.4 g/dL (ref 32.0–36.0)
MCV: 113.4 fL — AB (ref 80.0–100.0)
Monocytes Absolute: 1.6 10*3/uL — ABNORMAL HIGH (ref 0.2–0.9)
Monocytes Relative: 17 %
NEUTROS PCT: 61 %
Neutro Abs: 5.8 10*3/uL (ref 1.4–6.5)
Platelets: 224 10*3/uL (ref 150–440)
RBC: 1.53 MIL/uL — AB (ref 3.80–5.20)
RDW: 21.5 % — ABNORMAL HIGH (ref 11.5–14.5)
WBC: 9.5 10*3/uL (ref 3.6–11.0)

## 2014-10-03 MED ORDER — EPOETIN ALFA 40000 UNIT/ML IJ SOLN
40000.0000 [IU] | Freq: Once | INTRAMUSCULAR | Status: AC
  Administered 2014-10-03: 40000 [IU] via SUBCUTANEOUS
  Filled 2014-10-03: qty 1

## 2014-10-04 ENCOUNTER — Other Ambulatory Visit: Payer: Self-pay | Admitting: Internal Medicine

## 2014-10-04 DIAGNOSIS — D571 Sickle-cell disease without crisis: Secondary | ICD-10-CM

## 2014-10-04 MED ORDER — ACETAMINOPHEN 325 MG PO TABS: 650.0000 mg | ORAL_TABLET | Freq: Once | ORAL | Status: DC

## 2014-10-04 MED ORDER — HEPARIN SOD (PORK) LOCK FLUSH 100 UNIT/ML IV SOLN
250.0000 [IU] | INTRAVENOUS | Status: AC | PRN
  Filled 2014-10-04: qty 5

## 2014-10-04 MED ORDER — SODIUM CHLORIDE 0.9 % IV SOLN
250.0000 mL | Freq: Once | INTRAVENOUS | Status: AC
  Administered 2014-10-05: 250 mL via INTRAVENOUS
  Filled 2014-10-04: qty 250

## 2014-10-04 MED ORDER — SODIUM CHLORIDE 0.9 % IJ SOLN
10.0000 mL | INTRAMUSCULAR | Status: AC | PRN
  Administered 2014-10-05: 10 mL
  Filled 2014-10-04: qty 10

## 2014-10-04 MED ORDER — SODIUM CHLORIDE 0.9 % IV SOLN: 15.0000 mg/kg/h | Freq: Once | INTRAVENOUS | Status: DC

## 2014-10-04 MED ORDER — DIPHENHYDRAMINE HCL 25 MG PO CAPS: 25.0000 mg | ORAL_CAPSULE | Freq: Once | ORAL | Status: DC

## 2014-10-04 MED ORDER — HEPARIN SOD (PORK) LOCK FLUSH 100 UNIT/ML IV SOLN
500.0000 [IU] | Freq: Every day | INTRAVENOUS | Status: AC | PRN
  Administered 2014-10-05: 500 [IU]
  Filled 2014-10-04: qty 5

## 2014-10-04 MED ORDER — SODIUM CHLORIDE 0.9 % IJ SOLN
3.0000 mL | INTRAMUSCULAR | Status: DC | PRN
Start: 2014-10-04 — End: 2015-07-30
  Filled 2014-10-04: qty 10

## 2014-10-05 ENCOUNTER — Inpatient Hospital Stay: Payer: BLUE CROSS/BLUE SHIELD

## 2014-10-05 VITALS — BP 128/59 | HR 78 | Temp 97.0°F | Resp 20

## 2014-10-05 DIAGNOSIS — D571 Sickle-cell disease without crisis: Secondary | ICD-10-CM

## 2014-10-05 DIAGNOSIS — D638 Anemia in other chronic diseases classified elsewhere: Secondary | ICD-10-CM

## 2014-10-05 LAB — SAMPLE TO BLOOD BANK

## 2014-10-05 LAB — PREPARE RBC (CROSSMATCH)

## 2014-10-05 LAB — ABO/RH: ABO/RH(D): O POS

## 2014-10-05 MED ORDER — DIPHENHYDRAMINE HCL 25 MG PO CAPS
25.0000 mg | ORAL_CAPSULE | Freq: Once | ORAL | Status: AC
  Administered 2014-10-05: 25 mg via ORAL
  Filled 2014-10-05: qty 1

## 2014-10-05 MED ORDER — ACETAMINOPHEN 325 MG PO TABS
650.0000 mg | ORAL_TABLET | Freq: Once | ORAL | Status: AC
  Administered 2014-10-05: 650 mg via ORAL
  Filled 2014-10-05: qty 2

## 2014-10-05 MED ORDER — SODIUM CHLORIDE 0.9 % IV SOLN
2.0000 g | Freq: Once | INTRAVENOUS | Status: AC
  Administered 2014-10-05: 2 g via INTRAVENOUS
  Filled 2014-10-05: qty 2

## 2014-10-05 MED ORDER — HEPARIN SOD (PORK) LOCK FLUSH 100 UNIT/ML IV SOLN
500.0000 [IU] | Freq: Once | INTRAVENOUS | Status: AC
Start: 2014-10-05 — End: 2014-10-05
  Administered 2014-10-05: 500 [IU] via INTRAVENOUS

## 2014-10-06 LAB — TYPE AND SCREEN
ABO/RH(D): O POS
Antibody Screen: NEGATIVE
Unit division: 0
Unit division: 0

## 2014-10-09 ENCOUNTER — Other Ambulatory Visit: Payer: Self-pay

## 2014-10-09 DIAGNOSIS — D631 Anemia in chronic kidney disease: Secondary | ICD-10-CM

## 2014-10-09 DIAGNOSIS — N189 Chronic kidney disease, unspecified: Principal | ICD-10-CM

## 2014-10-10 ENCOUNTER — Inpatient Hospital Stay: Payer: BLUE CROSS/BLUE SHIELD

## 2014-10-10 ENCOUNTER — Ambulatory Visit: Payer: Self-pay

## 2014-10-15 ENCOUNTER — Other Ambulatory Visit: Payer: Self-pay | Admitting: *Deleted

## 2014-10-15 DIAGNOSIS — D638 Anemia in other chronic diseases classified elsewhere: Secondary | ICD-10-CM

## 2014-10-17 ENCOUNTER — Encounter: Payer: Self-pay | Admitting: Internal Medicine

## 2014-10-17 ENCOUNTER — Inpatient Hospital Stay: Payer: BLUE CROSS/BLUE SHIELD

## 2014-10-17 ENCOUNTER — Inpatient Hospital Stay: Payer: BLUE CROSS/BLUE SHIELD | Attending: Internal Medicine | Admitting: Internal Medicine

## 2014-10-17 VITALS — BP 142/79 | HR 89 | Temp 97.8°F | Resp 18 | Ht 67.0 in | Wt 186.1 lb

## 2014-10-17 DIAGNOSIS — D72829 Elevated white blood cell count, unspecified: Secondary | ICD-10-CM | POA: Insufficient documentation

## 2014-10-17 DIAGNOSIS — N189 Chronic kidney disease, unspecified: Secondary | ICD-10-CM | POA: Insufficient documentation

## 2014-10-17 DIAGNOSIS — F17210 Nicotine dependence, cigarettes, uncomplicated: Secondary | ICD-10-CM | POA: Diagnosis not present

## 2014-10-17 DIAGNOSIS — D571 Sickle-cell disease without crisis: Secondary | ICD-10-CM | POA: Diagnosis not present

## 2014-10-17 DIAGNOSIS — E559 Vitamin D deficiency, unspecified: Secondary | ICD-10-CM | POA: Insufficient documentation

## 2014-10-17 DIAGNOSIS — M818 Other osteoporosis without current pathological fracture: Secondary | ICD-10-CM | POA: Insufficient documentation

## 2014-10-17 DIAGNOSIS — R17 Unspecified jaundice: Secondary | ICD-10-CM | POA: Insufficient documentation

## 2014-10-17 DIAGNOSIS — Z79899 Other long term (current) drug therapy: Secondary | ICD-10-CM | POA: Diagnosis not present

## 2014-10-17 DIAGNOSIS — R0602 Shortness of breath: Secondary | ICD-10-CM | POA: Insufficient documentation

## 2014-10-17 DIAGNOSIS — K219 Gastro-esophageal reflux disease without esophagitis: Secondary | ICD-10-CM | POA: Insufficient documentation

## 2014-10-17 DIAGNOSIS — I129 Hypertensive chronic kidney disease with stage 1 through stage 4 chronic kidney disease, or unspecified chronic kidney disease: Secondary | ICD-10-CM | POA: Diagnosis not present

## 2014-10-17 DIAGNOSIS — R05 Cough: Secondary | ICD-10-CM | POA: Diagnosis not present

## 2014-10-17 DIAGNOSIS — D638 Anemia in other chronic diseases classified elsewhere: Secondary | ICD-10-CM

## 2014-10-17 DIAGNOSIS — D631 Anemia in chronic kidney disease: Secondary | ICD-10-CM

## 2014-10-17 LAB — CBC WITH DIFFERENTIAL/PLATELET
Basophils Absolute: 0.1 10*3/uL (ref 0–0.1)
Basophils Relative: 1 %
Eosinophils Absolute: 0.1 10*3/uL (ref 0–0.7)
Eosinophils Relative: 2 %
HEMATOCRIT: 21.4 % — AB (ref 35.0–47.0)
HEMOGLOBIN: 7.2 g/dL — AB (ref 12.0–16.0)
LYMPHS PCT: 35 %
Lymphs Abs: 2.7 10*3/uL (ref 1.0–3.6)
MCH: 35.2 pg — ABNORMAL HIGH (ref 26.0–34.0)
MCHC: 33.7 g/dL (ref 32.0–36.0)
MCV: 104.5 fL — AB (ref 80.0–100.0)
Monocytes Absolute: 1 10*3/uL — ABNORMAL HIGH (ref 0.2–0.9)
Monocytes Relative: 13 %
NRBC: 4 /100{WBCs} — AB
Neutro Abs: 3.8 10*3/uL (ref 1.4–6.5)
Neutrophils Relative %: 49 %
PLATELETS: 264 10*3/uL (ref 150–440)
RBC: 2.05 MIL/uL — AB (ref 3.80–5.20)
RDW: 22.8 % — ABNORMAL HIGH (ref 11.5–14.5)
WBC: 7.7 10*3/uL (ref 3.6–11.0)

## 2014-10-17 LAB — SAMPLE TO BLOOD BANK

## 2014-10-17 MED ORDER — EPOETIN ALFA 40000 UNIT/ML IJ SOLN
40000.0000 [IU] | INTRAMUSCULAR | Status: DC
  Administered 2014-10-17: 40000 [IU] via SUBCUTANEOUS
  Filled 2014-10-17: qty 1

## 2014-10-24 ENCOUNTER — Inpatient Hospital Stay: Payer: BLUE CROSS/BLUE SHIELD

## 2014-10-24 VITALS — BP 132/68 | HR 80 | Temp 97.0°F | Resp 18

## 2014-10-24 DIAGNOSIS — D631 Anemia in chronic kidney disease: Secondary | ICD-10-CM

## 2014-10-24 DIAGNOSIS — N189 Chronic kidney disease, unspecified: Secondary | ICD-10-CM | POA: Diagnosis not present

## 2014-10-24 DIAGNOSIS — D571 Sickle-cell disease without crisis: Secondary | ICD-10-CM

## 2014-10-24 LAB — HEMOGLOBIN: HEMOGLOBIN: 7.6 g/dL — AB (ref 12.0–16.0)

## 2014-10-24 MED ORDER — EPOETIN ALFA 40000 UNIT/ML IJ SOLN
40000.0000 [IU] | INTRAMUSCULAR | Status: DC
  Administered 2014-10-24: 40000 [IU] via SUBCUTANEOUS
  Filled 2014-10-24: qty 1

## 2014-10-28 NOTE — Progress Notes (Signed)
[place]  Telephone:([phone]) [phone] [phone]) [phone]     ID: Jane Short OB: 11/12/1950  MR#: [id number]  STM#:[id number]  Patient Care Team: Jackolyn Confer, MD as PCP - General (Internal Medicine)  CHIEF COMPLAINT/DIAGNOSIS:  1. 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  On Hydrea 892 mg daily and folic acid.  Also has anemia of chronic renal insufficiency (serum creatinine by Dr. Juleen China on 08/11/12 was 1.95, with calculated creatinine clearance of 35-40 mL/min). 08/12/12 - serum EPO level was 268.6. On Procrit therapy and intermittent PRBC transfusion support.  2. Iron overload, on EXJADE 1 tablet daily.  Bone marrow biopsy 04/25/13 - No diagnostic evidence of myelodysplasia. Hypercellular marrow for age with a marked expansion of the erythroid compartment, adequate myelopoiesis and megakaryopoiesis without increased blasts (0.3%). A few small nondiagnostic lymphoid aggregates are noted. No significant increase in marrow reticulin fibers. Abundant storage iron is present. Cytogenetics - insufficient sample to perform.    HISTORY OF PRESENT ILLNESS:  Patient here for continued hematology evaluation, she was seen few. She did get PRBC tx recently and Hb today is better months ago. She is on Procrit therapy for persistent severe anemia, she also gets intermittent supportived packed red blood cell transfusion when hemoglobin usually drops below 6 and if she gets symptomatic from it. States that chronic cough and dyspnea on exertion are at baseline, no changes. She takes Hydrea, denies any side effects from this. No angina, palpitation, or orthopnea. She is physically active and ambulatory. No new bone pains. She has history of sickle cell disease, denies any recent pain crisis or hospitalization. States that she would like to try Chantix to quit smoking.   REVIEW OF SYSTEMS:   ROS As in HPI above. In addition, no fever, chills. No new headaches  or focal weakness.  No new mood disturbances. No  sore throat, cough, hemoptysis or chest pain. No dizziness or palpitation. No abdominal pain, constipation, diarrhea, dysuria or hematuria. No new skin rash or bleeding symptoms. No new paresthesias in extremities. PS ECOG 1.  PAST MEDICAL HISTORY: Past Medical History  Diagnosis Date  . Sickle cell anemia   . Vitamin D deficiency   . Hypertension   . Osteoporosis 2011    osteopenia  . 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  . Gout   . Sickle cell anemia   . Lichen planus   . SCA-1 (spinocerebellar ataxia type 1)          Chronic anemia, sickle cell disease  Iron overload from repeated transfusion  Lichen planus right foot  Gastroesophageal reflux disease    Past Surgical History:  Cholecystectomy 2002  Cataract surgery 2004  PAST SURGICAL HISTORY: Past Surgical History  Procedure Laterality Date  . Tubal ligation    . Gallbladder surgery      FAMILY HISTORY Family History  Problem Relation Age of Onset  . Heart disease Father   . Diabetes Father   . Diabetes Sister   Remarkable for diabetes, heart disease, hypertension, and  breast cancer in her mother.   ADVANCED DIRECTIVES:   SOCIAL HISTORY: History  Substance Use Topics  . Smoking status: Current Every Day Smoker -- 0.50 packs/day    Types: Cigarettes  . Smokeless tobacco: Never Used  . Alcohol Use: No  Chronic smoker. No history of alcohol or recreational drug usage.  No Known Allergies  Current Outpatient Prescriptions  Medication Sig Dispense Refill  .  albuterol (PROVENTIL HFA;VENTOLIN HFA) 108 (90 BASE) MCG/ACT inhaler Inhale 2 puffs into the lungs every 6 (six) hours as needed. 18 g 6  . allopurinol (ZYLOPRIM) 100 MG tablet     . calcitRIOL (ROCALTROL) 0.25 MCG capsule Take 0.25 mcg by mouth 3 (three) times a week. Monday, Wed and Fri    . deferasirox (EXJADE) 500 MG disintegrating tablet Take by mouth daily before breakfast.    .  folic acid (FOLVITE) 1 MG tablet Take 1 mg by mouth daily.    . furosemide (LASIX) 20 MG tablet Take 20 mg by mouth. Take 3 tablets (67m) by mouth 2 times daily    . hydroxyurea (HYDREA) 500 MG capsule Take 500 mg by mouth daily. May take with food to minimize GI side effects.    . ramipril (ALTACE) 5 MG capsule Take 5 mg by mouth daily.    . traMADol (ULTRAM) 50 MG tablet Take 50 mg by mouth.    . varenicline (CHANTIX) 0.5 MG tablet Take 0.5 mg by mouth 2 (two) times daily.    .Marland KitchenKIONEX 15 GM/60ML suspension      No current facility-administered medications for this visit.   Facility-Administered Medications Ordered in Other Visits  Medication Dose Route Frequency Provider Last Rate Last Dose  . epoetin alfa (EPOGEN,PROCRIT) injection 40,000 Units  40,000 Units Subcutaneous Weekly SLeia Alf MD   40,000 Units at 10/24/14 1609  . heparin lock flush 100 unit/mL  250 Units Intracatheter PRN SLeia Alf MD      . sodium chloride 0.9 % injection 3 mL  3 mL Intracatheter PRN SLeia Alf MD   3 mL at 10/05/14 0851    OBJECTIVE: Filed Vitals:   10/17/14 1454  BP: 142/79  Pulse: 89  Temp: 97.8 F (36.6 C)  Resp: 18     Body mass index is 29.14 kg/(m^2).    ECOG FS:1 - Symptomatic but completely ambulatory  GENERAL: Patient is alert and oriented and in no acute distress. There is no icterus. HEENT: EOMs intact. Oral exam negative for thrush or lesions. No cervical lymphadenopathy. CVS: S1S2, regular LUNGS: Bilaterally clear to auscultation, no rhonchi. ABDOMEN: Soft, nontender. No hepatosplenomegaly clinically.  NEURO: grossly nonfocal, cranial nerves are intact. Gait unremarkable. EXTREMITIES: No pedal edema. LYMPHATICS: No palpable adenopathy in axillary or inguinal areas.   LAB RESULTS:     Component Value Date/Time   NA 138 06/20/2012 1400   NA 139 11/26/2011 0913   NA 141 02/20/2010   K 4.3 06/20/2012 1400   K 5.2* 11/26/2011 0913   CL 105 06/20/2012 1400   CL  107 11/26/2011 0913   CO2 24 06/20/2012 1400   CO2 24 11/26/2011 0913   GLUCOSE 102* 06/20/2012 1400   GLUCOSE 89 11/26/2011 0913   BUN 13 06/20/2012 1400   BUN 20 11/26/2011 0913   BUN 14 02/20/2010   CREATININE 1.36* 03/22/2013 1522   CREATININE 1.6* 11/26/2011 0913   CREATININE 1.1 02/20/2010   CALCIUM 9.4 06/20/2012 1400   CALCIUM 9.9 11/26/2011 0913   PROT 7.1 03/22/2013 1522   PROT 7.3 11/26/2011 0913   ALBUMIN 3.7 03/22/2013 1522   ALBUMIN 3.8 11/26/2011 0913   AST 48* 03/22/2013 1522   AST 44* 11/26/2011 0913   ALT 25 03/22/2013 1522   ALT 17 11/26/2011 0913   ALKPHOS 139* 03/22/2013 1522   ALKPHOS 131* 11/26/2011 0913   BILITOT 6.3* 11/26/2011 0913   GFRNONAA 42* 03/22/2013 1522   GFRAA 48* 03/22/2013 1522  Lab Results  Component Value Date   WBC 7.7 10/17/2014   NEUTROABS 3.8 10/17/2014   HGB 7.6* 10/24/2014   HCT 21.4* 10/17/2014   MCV 104.5* 10/17/2014   PLT 264 10/17/2014    ASSESSMENT / PLAN:   1. Chronic anemia, sickle cell disease, on hydroxyurea 500 mg daily. Also has anemia of chronic renal insufficiency (serum creatinine by Dr. Juleen China on 08/11/2012 was 1.95, with calculated creatinine clearance of 35-40 mL/min). On Procrit and on folic acid 1 mg daily. Bone marrow biopsy done on 04/25/13 is negative for underlying marrow disorder like myelodysplasia  -   Reviewed labs from today and d/w patient.  Hemoglobin continues to remain low but fluctuates within a steady range of 5.3 to 6.9, occasionally it goes > 7g after transfusion support. Hemoglobin is better at 7.2g today, patient states that she feels fairly good. Plan is to continue to monitor hemoglobin qweekly and give Procrit 40000 units subcu injection if hemoglobin is 10 or less. Recent B12, folate, stool Hemoccult, SIEP were unremarkable in July 2015. Continue to monitor iron study intermittently. Next MD followup at 24 weeks with repeat CBC/differential, and make plan of management.  2. 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 Continue to monitor intermittently. 3. Chronic leukocytosis. BCR-ABL is negative. Peripheral blood immunophenotyping also unremarkable. Most likely has reactive leukocytosis secondary to smoking. WBC count is normal today. Continue to monitor intermittently. 4. Iron overload - Iron study from December 30 showed ferritin normal at 175, iron saturation 42, and serum iron 127. Continue Exjade 500 mg once daily.  5. Smoking Cessation -  Have again explained extreme importance of quitting smoking and advised about risks of smoking, states she will try to quit on her own and does not want to try any patches or medication. In between visits, patient advised to call or come to ER in case of any new symptoms or acute sickness. She is agreeable to this plan.      Leia Alf, MD   10/28/2014 9:52 AM

## 2014-10-31 ENCOUNTER — Inpatient Hospital Stay: Payer: BLUE CROSS/BLUE SHIELD | Attending: Internal Medicine

## 2014-10-31 ENCOUNTER — Inpatient Hospital Stay: Payer: BLUE CROSS/BLUE SHIELD

## 2014-10-31 VITALS — BP 143/67 | HR 76

## 2014-10-31 DIAGNOSIS — D631 Anemia in chronic kidney disease: Secondary | ICD-10-CM

## 2014-10-31 DIAGNOSIS — N189 Chronic kidney disease, unspecified: Secondary | ICD-10-CM | POA: Insufficient documentation

## 2014-10-31 DIAGNOSIS — Z79899 Other long term (current) drug therapy: Secondary | ICD-10-CM | POA: Insufficient documentation

## 2014-10-31 DIAGNOSIS — D571 Sickle-cell disease without crisis: Secondary | ICD-10-CM

## 2014-10-31 LAB — SAMPLE TO BLOOD BANK

## 2014-10-31 LAB — FERRITIN: Ferritin: 64 ng/mL (ref 11–307)

## 2014-10-31 LAB — IRON AND TIBC
Iron: 63 ug/dL (ref 28–170)
Saturation Ratios: 18 % (ref 10.4–31.8)
TIBC: 356 ug/dL (ref 250–450)
UIBC: 293 ug/dL

## 2014-10-31 LAB — HEMOGLOBIN: HEMOGLOBIN: 6.9 g/dL — AB (ref 12.0–16.0)

## 2014-10-31 MED ORDER — EPOETIN ALFA 40000 UNIT/ML IJ SOLN
40000.0000 [IU] | INTRAMUSCULAR | Status: DC
  Administered 2014-10-31: 40000 [IU] via SUBCUTANEOUS
  Filled 2014-10-31 (×2): qty 1

## 2014-11-07 ENCOUNTER — Inpatient Hospital Stay: Payer: BLUE CROSS/BLUE SHIELD

## 2014-11-07 VITALS — BP 154/62 | HR 74 | Resp 18

## 2014-11-07 DIAGNOSIS — N189 Chronic kidney disease, unspecified: Principal | ICD-10-CM

## 2014-11-07 DIAGNOSIS — D571 Sickle-cell disease without crisis: Secondary | ICD-10-CM

## 2014-11-07 DIAGNOSIS — D631 Anemia in chronic kidney disease: Secondary | ICD-10-CM

## 2014-11-07 LAB — SAMPLE TO BLOOD BANK

## 2014-11-07 LAB — HEMOGLOBIN: HEMOGLOBIN: 6.9 g/dL — AB (ref 12.0–16.0)

## 2014-11-07 MED ORDER — EPOETIN ALFA 40000 UNIT/ML IJ SOLN
40000.0000 [IU] | INTRAMUSCULAR | Status: DC
  Administered 2014-11-07: 40000 [IU] via SUBCUTANEOUS
  Filled 2014-11-07: qty 1

## 2014-11-14 ENCOUNTER — Inpatient Hospital Stay: Payer: BLUE CROSS/BLUE SHIELD

## 2014-11-14 VITALS — BP 126/67 | HR 96 | Temp 97.9°F | Resp 18

## 2014-11-14 DIAGNOSIS — N189 Chronic kidney disease, unspecified: Principal | ICD-10-CM

## 2014-11-14 DIAGNOSIS — D631 Anemia in chronic kidney disease: Secondary | ICD-10-CM

## 2014-11-14 DIAGNOSIS — D571 Sickle-cell disease without crisis: Secondary | ICD-10-CM

## 2014-11-14 LAB — HEMOGLOBIN: Hemoglobin: 6.9 g/dL — ABNORMAL LOW (ref 12.0–16.0)

## 2014-11-14 LAB — SAMPLE TO BLOOD BANK

## 2014-11-14 MED ORDER — EPOETIN ALFA 40000 UNIT/ML IJ SOLN
40000.0000 [IU] | Freq: Once | INTRAMUSCULAR | Status: AC
  Administered 2014-11-14: 40000 [IU] via SUBCUTANEOUS
  Filled 2014-11-14: qty 1

## 2014-11-21 ENCOUNTER — Inpatient Hospital Stay: Payer: BLUE CROSS/BLUE SHIELD

## 2014-11-21 VITALS — BP 133/65 | HR 82 | Resp 20

## 2014-11-21 DIAGNOSIS — D571 Sickle-cell disease without crisis: Secondary | ICD-10-CM

## 2014-11-21 DIAGNOSIS — D631 Anemia in chronic kidney disease: Secondary | ICD-10-CM

## 2014-11-21 DIAGNOSIS — N189 Chronic kidney disease, unspecified: Secondary | ICD-10-CM | POA: Diagnosis not present

## 2014-11-21 LAB — CBC WITH DIFFERENTIAL/PLATELET
Basophils Absolute: 0.1 10*3/uL (ref 0–0.1)
Basophils Relative: 2 %
EOS ABS: 0.1 10*3/uL (ref 0–0.7)
Eosinophils Relative: 2 %
HCT: 20.6 % — ABNORMAL LOW (ref 35.0–47.0)
HEMOGLOBIN: 7.1 g/dL — AB (ref 12.0–16.0)
LYMPHS ABS: 1.8 10*3/uL (ref 1.0–3.6)
Lymphocytes Relative: 24 %
MCH: 36.6 pg — AB (ref 26.0–34.0)
MCHC: 34.2 g/dL (ref 32.0–36.0)
MCV: 107 fL — ABNORMAL HIGH (ref 80.0–100.0)
Monocytes Absolute: 1.3 10*3/uL — ABNORMAL HIGH (ref 0.2–0.9)
Monocytes Relative: 17 %
NRBC: 14 /100{WBCs} — AB
Neutro Abs: 4.2 10*3/uL (ref 1.4–6.5)
Platelets: 233 10*3/uL (ref 150–440)
RBC: 1.93 MIL/uL — ABNORMAL LOW (ref 3.80–5.20)
RDW: 23.9 % — ABNORMAL HIGH (ref 11.5–14.5)
WBC: 7.5 10*3/uL (ref 3.6–11.0)

## 2014-11-21 LAB — SAMPLE TO BLOOD BANK

## 2014-11-21 MED ORDER — EPOETIN ALFA 40000 UNIT/ML IJ SOLN
40000.0000 [IU] | Freq: Once | INTRAMUSCULAR | Status: AC
  Administered 2014-11-21: 40000 [IU] via SUBCUTANEOUS
  Filled 2014-11-21: qty 1

## 2014-11-27 ENCOUNTER — Other Ambulatory Visit: Payer: Self-pay

## 2014-11-28 ENCOUNTER — Inpatient Hospital Stay: Payer: BLUE CROSS/BLUE SHIELD

## 2014-11-28 VITALS — BP 128/70 | HR 68

## 2014-11-28 DIAGNOSIS — D571 Sickle-cell disease without crisis: Secondary | ICD-10-CM

## 2014-11-28 DIAGNOSIS — N189 Chronic kidney disease, unspecified: Principal | ICD-10-CM

## 2014-11-28 DIAGNOSIS — D631 Anemia in chronic kidney disease: Secondary | ICD-10-CM

## 2014-11-28 LAB — HEMOGLOBIN: HEMOGLOBIN: 6.6 g/dL — AB (ref 12.0–16.0)

## 2014-11-28 LAB — SAMPLE TO BLOOD BANK

## 2014-11-28 MED ORDER — EPOETIN ALFA 40000 UNIT/ML IJ SOLN
40000.0000 [IU] | INTRAMUSCULAR | Status: DC
  Administered 2014-11-28: 40000 [IU] via SUBCUTANEOUS
  Filled 2014-11-28: qty 1

## 2014-11-28 MED ORDER — HEPARIN SOD (PORK) LOCK FLUSH 100 UNIT/ML IV SOLN
500.0000 [IU] | Freq: Once | INTRAVENOUS | Status: AC
  Administered 2014-11-28: 500 [IU] via INTRAVENOUS

## 2014-11-28 MED ORDER — SODIUM CHLORIDE 0.9 % IJ SOLN
10.0000 mL | INTRAMUSCULAR | Status: DC | PRN
  Administered 2014-11-28: 10 mL via INTRAVENOUS
  Filled 2014-11-28: qty 10

## 2014-11-28 MED ORDER — HEPARIN SOD (PORK) LOCK FLUSH 100 UNIT/ML IV SOLN
INTRAVENOUS | Status: AC
  Filled 2014-11-28: qty 10

## 2014-12-05 ENCOUNTER — Inpatient Hospital Stay: Payer: BLUE CROSS/BLUE SHIELD | Attending: Internal Medicine

## 2014-12-05 ENCOUNTER — Inpatient Hospital Stay: Payer: BLUE CROSS/BLUE SHIELD

## 2014-12-05 VITALS — BP 115/68 | HR 70 | Temp 97.0°F | Resp 18

## 2014-12-05 DIAGNOSIS — N189 Chronic kidney disease, unspecified: Secondary | ICD-10-CM | POA: Insufficient documentation

## 2014-12-05 DIAGNOSIS — Z79899 Other long term (current) drug therapy: Secondary | ICD-10-CM | POA: Insufficient documentation

## 2014-12-05 DIAGNOSIS — D631 Anemia in chronic kidney disease: Secondary | ICD-10-CM

## 2014-12-05 DIAGNOSIS — D571 Sickle-cell disease without crisis: Secondary | ICD-10-CM

## 2014-12-05 LAB — SAMPLE TO BLOOD BANK

## 2014-12-05 LAB — HEMOGLOBIN: Hemoglobin: 6.3 g/dL — ABNORMAL LOW (ref 12.0–16.0)

## 2014-12-05 MED ORDER — EPOETIN ALFA 40000 UNIT/ML IJ SOLN
40000.0000 [IU] | INTRAMUSCULAR | Status: DC
  Administered 2014-12-05: 40000 [IU] via SUBCUTANEOUS
  Filled 2014-12-05: qty 1

## 2014-12-12 ENCOUNTER — Inpatient Hospital Stay: Payer: BLUE CROSS/BLUE SHIELD

## 2014-12-12 VITALS — BP 114/63 | HR 120 | Resp 18

## 2014-12-12 DIAGNOSIS — D631 Anemia in chronic kidney disease: Secondary | ICD-10-CM

## 2014-12-12 DIAGNOSIS — N189 Chronic kidney disease, unspecified: Principal | ICD-10-CM

## 2014-12-12 DIAGNOSIS — D571 Sickle-cell disease without crisis: Secondary | ICD-10-CM

## 2014-12-12 LAB — SAMPLE TO BLOOD BANK

## 2014-12-12 LAB — HEMOGLOBIN: Hemoglobin: 6.1 g/dL — ABNORMAL LOW (ref 12.0–16.0)

## 2014-12-12 MED ORDER — EPOETIN ALFA 40000 UNIT/ML IJ SOLN
40000.0000 [IU] | INTRAMUSCULAR | Status: DC
  Administered 2014-12-12: 40000 [IU] via SUBCUTANEOUS
  Filled 2014-12-12: qty 1

## 2014-12-13 ENCOUNTER — Other Ambulatory Visit: Payer: Self-pay | Admitting: Internal Medicine

## 2014-12-13 DIAGNOSIS — D649 Anemia, unspecified: Secondary | ICD-10-CM

## 2014-12-14 ENCOUNTER — Inpatient Hospital Stay: Payer: BLUE CROSS/BLUE SHIELD

## 2014-12-14 VITALS — BP 116/66 | HR 96 | Temp 98.2°F | Resp 18 | Ht 67.01 in | Wt 195.0 lb

## 2014-12-14 DIAGNOSIS — N189 Chronic kidney disease, unspecified: Secondary | ICD-10-CM | POA: Diagnosis not present

## 2014-12-14 DIAGNOSIS — D649 Anemia, unspecified: Secondary | ICD-10-CM

## 2014-12-14 LAB — PREPARE RBC (CROSSMATCH)

## 2014-12-14 MED ORDER — ACETAMINOPHEN 325 MG PO TABS
650.0000 mg | ORAL_TABLET | Freq: Once | ORAL | Status: AC
  Administered 2014-12-14: 650 mg via ORAL
  Filled 2014-12-14: qty 2

## 2014-12-14 MED ORDER — SODIUM CHLORIDE 0.9 % IV SOLN
15.0000 mg/kg/h | Freq: Once | INTRAVENOUS | Status: AC
  Administered 2014-12-14: 15 mg/kg/h via INTRAVENOUS
  Filled 2014-12-14: qty 2

## 2014-12-14 MED ORDER — HEPARIN SOD (PORK) LOCK FLUSH 100 UNIT/ML IV SOLN
500.0000 [IU] | Freq: Once | INTRAVENOUS | Status: AC
  Administered 2014-12-14: 500 [IU] via INTRAVENOUS

## 2014-12-14 MED ORDER — DIPHENHYDRAMINE HCL 25 MG PO CAPS
25.0000 mg | ORAL_CAPSULE | Freq: Once | ORAL | Status: AC
  Administered 2014-12-14: 25 mg via ORAL
  Filled 2014-12-14: qty 1

## 2014-12-14 MED ORDER — SODIUM CHLORIDE 0.9 % IV SOLN: 15.0000 mg/kg/h | Freq: Once | INTRAVENOUS | Status: DC

## 2014-12-14 MED ORDER — SODIUM CHLORIDE 0.9 % IJ SOLN
10.0000 mL | INTRAMUSCULAR | Status: DC | PRN
  Filled 2014-12-14: qty 10

## 2014-12-14 MED ORDER — HEPARIN SOD (PORK) LOCK FLUSH 100 UNIT/ML IV SOLN
INTRAVENOUS | Status: AC
  Filled 2014-12-14: qty 10

## 2014-12-16 LAB — TYPE AND SCREEN
ABO/RH(D): O POS
ANTIBODY SCREEN: NEGATIVE
Unit division: 0

## 2014-12-19 ENCOUNTER — Inpatient Hospital Stay: Payer: BLUE CROSS/BLUE SHIELD

## 2014-12-19 VITALS — BP 140/69 | HR 99

## 2014-12-19 DIAGNOSIS — D631 Anemia in chronic kidney disease: Secondary | ICD-10-CM

## 2014-12-19 DIAGNOSIS — N189 Chronic kidney disease, unspecified: Secondary | ICD-10-CM | POA: Diagnosis not present

## 2014-12-19 DIAGNOSIS — D571 Sickle-cell disease without crisis: Secondary | ICD-10-CM

## 2014-12-19 LAB — HEMOGLOBIN: Hemoglobin: 6.4 g/dL — ABNORMAL LOW (ref 12.0–16.0)

## 2014-12-19 LAB — SAMPLE TO BLOOD BANK

## 2014-12-19 MED ORDER — EPOETIN ALFA 40000 UNIT/ML IJ SOLN
40000.0000 [IU] | INTRAMUSCULAR | Status: DC
  Administered 2014-12-19: 40000 [IU] via SUBCUTANEOUS

## 2014-12-26 ENCOUNTER — Inpatient Hospital Stay: Payer: BLUE CROSS/BLUE SHIELD

## 2014-12-30 ENCOUNTER — Emergency Department: Payer: BLUE CROSS/BLUE SHIELD

## 2014-12-30 ENCOUNTER — Encounter: Payer: Self-pay | Admitting: Emergency Medicine

## 2014-12-30 ENCOUNTER — Other Ambulatory Visit: Payer: Self-pay

## 2014-12-30 DIAGNOSIS — N189 Chronic kidney disease, unspecified: Secondary | ICD-10-CM | POA: Diagnosis not present

## 2014-12-30 DIAGNOSIS — M546 Pain in thoracic spine: Secondary | ICD-10-CM | POA: Diagnosis not present

## 2014-12-30 DIAGNOSIS — Z720 Tobacco use: Secondary | ICD-10-CM | POA: Diagnosis not present

## 2014-12-30 DIAGNOSIS — Z79899 Other long term (current) drug therapy: Secondary | ICD-10-CM | POA: Diagnosis not present

## 2014-12-30 DIAGNOSIS — R1900 Intra-abdominal and pelvic swelling, mass and lump, unspecified site: Secondary | ICD-10-CM | POA: Diagnosis not present

## 2014-12-30 DIAGNOSIS — R05 Cough: Secondary | ICD-10-CM | POA: Insufficient documentation

## 2014-12-30 DIAGNOSIS — I129 Hypertensive chronic kidney disease with stage 1 through stage 4 chronic kidney disease, or unspecified chronic kidney disease: Secondary | ICD-10-CM | POA: Insufficient documentation

## 2014-12-30 LAB — URINALYSIS COMPLETE WITH MICROSCOPIC (ARMC ONLY)
BILIRUBIN URINE: NEGATIVE
GLUCOSE, UA: NEGATIVE mg/dL
HGB URINE DIPSTICK: NEGATIVE
Ketones, ur: NEGATIVE mg/dL
LEUKOCYTES UA: NEGATIVE
NITRITE: NEGATIVE
PH: 6 (ref 5.0–8.0)
Protein, ur: NEGATIVE mg/dL
SPECIFIC GRAVITY, URINE: 1.01 (ref 1.005–1.030)

## 2014-12-30 LAB — COMPREHENSIVE METABOLIC PANEL
ALBUMIN: 3.6 g/dL (ref 3.5–5.0)
ALT: 13 U/L — ABNORMAL LOW (ref 14–54)
ANION GAP: 10 (ref 5–15)
AST: 24 U/L (ref 15–41)
Alkaline Phosphatase: 93 U/L (ref 38–126)
BUN: 50 mg/dL — AB (ref 6–20)
CALCIUM: 9.2 mg/dL (ref 8.9–10.3)
CO2: 27 mmol/L (ref 22–32)
Chloride: 104 mmol/L (ref 101–111)
Creatinine, Ser: 2.15 mg/dL — ABNORMAL HIGH (ref 0.44–1.00)
GFR calc non Af Amer: 23 mL/min — ABNORMAL LOW (ref >60)
GFR, EST AFRICAN AMERICAN: 27 mL/min — AB (ref >60)
GLUCOSE: 102 mg/dL — AB (ref 65–99)
POTASSIUM: 4.3 mmol/L (ref 3.5–5.1)
SODIUM: 141 mmol/L (ref 135–145)
TOTAL PROTEIN: 6.7 g/dL (ref 6.5–8.1)
Total Bilirubin: 5.3 mg/dL — ABNORMAL HIGH (ref 0.3–1.2)

## 2014-12-30 LAB — CBC
HCT: 17.5 % — ABNORMAL LOW (ref 35.0–47.0)
Hemoglobin: 5.9 g/dL — ABNORMAL LOW (ref 12.0–16.0)
MCH: 34.8 pg — ABNORMAL HIGH (ref 26.0–34.0)
MCHC: 33.9 g/dL (ref 32.0–36.0)
MCV: 102.4 fL — ABNORMAL HIGH (ref 80.0–100.0)
PLATELETS: 324 10*3/uL (ref 150–440)
RBC: 1.71 MIL/uL — ABNORMAL LOW (ref 3.80–5.20)
RDW: 23.5 % — AB (ref 11.5–14.5)
WBC: 9.9 10*3/uL (ref 3.6–11.0)

## 2014-12-30 LAB — LIPASE, BLOOD: LIPASE: 55 U/L — AB (ref 22–51)

## 2014-12-30 NOTE — ED Notes (Signed)
Patient states that she has had abd swelling times 2 months. Patient states that she has had a cough times one month and increase in shortness of breath times 3 weeks. Patient states that she saw her kidney md 2 weeks ago and has not had any improvement.

## 2014-12-30 NOTE — ED Notes (Signed)
Pt c/o abdominal swelling (2 months) that she says is going into her back and an increased cough (x 1 month). Pt is taking furosemide and is a sickle cell patient.

## 2014-12-31 ENCOUNTER — Emergency Department
Admission: EM | Admit: 2014-12-31 | Discharge: 2014-12-31 | Disposition: A | Discharge status: Home / Self Care | Payer: BLUE CROSS/BLUE SHIELD | Attending: Emergency Medicine | Admitting: Emergency Medicine

## 2014-12-31 ENCOUNTER — Emergency Department: Payer: BLUE CROSS/BLUE SHIELD

## 2014-12-31 DIAGNOSIS — R05 Cough: Secondary | ICD-10-CM

## 2014-12-31 DIAGNOSIS — M546 Pain in thoracic spine: Secondary | ICD-10-CM

## 2014-12-31 DIAGNOSIS — R059 Cough, unspecified: Secondary | ICD-10-CM

## 2014-12-31 NOTE — Discharge Instructions (Signed)
As we discussed please follow up with your primary care doctor about the x-ray results and that you should undergo a CT scan to further characterize the nodule seen on today's x-ray. Please seek medical attention for any high fevers, chest pain, shortness of breath, change in behavior, persistent vomiting, bloody stool or any other new or concerning symptoms.  Back Pain, Adult Low back pain is very common. About 1 in 5 people have back pain.The cause of low back pain is rarely dangerous. The pain often gets better over time.About half of people with a sudden onset of back pain feel better in just 2 weeks. About 8 in 10 people feel better by 6 weeks.  CAUSES Some common causes of back pain include:  Strain of the muscles or ligaments supporting the spine.  Wear and tear (degeneration) of the spinal discs.  Arthritis.  Direct injury to the back. DIAGNOSIS Most of the time, the direct cause of low back pain is not known.However, back pain can be treated effectively even when the exact cause of the pain is unknown.Answering your caregiver's questions about your overall health and symptoms is one of the most accurate ways to make sure the cause of your pain is not dangerous. If your caregiver needs more information, he or she may order lab work or imaging tests (X-rays or MRIs).However, even if imaging tests show changes in your back, this usually does not require surgery. HOME CARE INSTRUCTIONS For many people, back pain returns.Since low back pain is rarely dangerous, it is often a condition that people can learn to [facility] their own.   Remain active. It is stressful on the back to sit or stand in one place. Do not sit, drive, or stand in one place for more than 30 minutes at a time. Take short walks on level surfaces as soon as pain allows.Try to increase the length of time you walk each day.  Do not stay in bed.Resting more than 1 or 2 days can delay your recovery.  Do not avoid  exercise or work.Your body is made to move.It is not dangerous to be active, even though your back may hurt.Your back will likely heal faster if you return to being active before your pain is gone.  Pay attention to your body when you bend and lift. Many people have less discomfortwhen lifting if they bend their knees, keep the load close to their bodies,and avoid twisting. Often, the most comfortable positions are those that put less stress on your recovering back.  Find a comfortable position to sleep. Use a firm mattress and lie on your side with your knees slightly bent. If you lie on your back, put a pillow under your knees.  Only take over-the-counter or prescription medicines as directed by your caregiver. Over-the-counter medicines to reduce pain and inflammation are often the most helpful.Your caregiver may prescribe muscle relaxant drugs.These medicines help dull your pain so you can more quickly return to your normal activities and healthy exercise.  Put ice on the injured area.  Put ice in a plastic bag.  Place a towel between your skin and the bag.  Leave the ice on for 15-20 minutes, 03-04 times a day for the first 2 to 3 days. After that, ice and heat may be alternated to reduce pain and spasms.  Ask your caregiver about trying back exercises and gentle massage. This may be of some benefit.  Avoid feeling anxious or stressed.Stress increases muscle tension and can worsen back pain.It  is important to recognize when you are anxious or stressed and learn ways to manage it.Exercise is a great option. SEEK MEDICAL CARE IF:  You have pain that is not relieved with rest or medicine.  You have pain that does not improve in 1 week.  You have new symptoms.  You are generally not feeling well. SEEK IMMEDIATE MEDICAL CARE IF:   You have pain that radiates from your back into your legs.  You develop new bowel or bladder control problems.  You have unusual weakness or  numbness in your arms or legs.  You develop nausea or vomiting.  You develop abdominal pain.  You feel faint. Document Released: 05/18/2005 Document Revised: 11/17/2011 Document Reviewed: 09/19/2013 [facility] Patient Information 2015 [place], [place]. This information is not intended to replace advice given to you by your health care provider. Make sure you discuss any questions you have with your health care provider.

## 2014-12-31 NOTE — ED Notes (Addendum)
Pt states has felt like her legs and abdomen have been swelling for 2 weeks. Pt states "that swelling is making me feel like i have pressure on my hips and it makes my back hurt, and i feel weak." pt states "i tried to vacuum today, but then my back just locked up on me and i had to sit down." pt states "when i sit down i'm ok, it seems to take the pressure from the swelling off, but when i try to stand up the pressure is back and i feel like my back is locking up again." pt states has pain to her mid back and is tender when thoracic spine palpated by md. Pt states she has felt intermittently short of breath, then states "but that's nothing new to me, i get short of breath and in this heat it's even worse." pt able to speak in full clear sentences. Cms intact to all extremities. Pt with bilateral yellow sclera, pt denies dark tarry stools, states urine is clear yellow without odor. Pt denies dysuria or loss of bladder or bowel control. Pt with trace pedal and ankle edema, trace edema to abd wall and bilateral tibial area. Pt states received one unit of blood approx 2 weeks ago for low hemoglobin. Pt with history of sickle cell. Pt with tachypnea noted on exertion while speaking with md and rn and attempting to reposition self in bed at same time. Pt with resp rate of 20 while at rest.

## 2014-12-31 NOTE — ED Provider Notes (Signed)
[facility] Emergency Department Provider Note   ____________________________________________  Time seen: 0025  I have reviewed the triage vital signs and the nursing notes.   HISTORY  Chief Complaint Cough; Abdominal Pain; and Shortness of Breath   History limited by: Not Limited   HPI Jane Short is a 64 y.o. female who presents to the emergency department today with multiple medical complaints. She states that she has had increased abdominal swelling for the past 2 months. Additionally the patient states that she has had cough for 1 month. It appears that the most recent symptom is mid back pain. This has been going on for the past couple of days. She states that it starts hurting when she stands up for long periods of time. She states that when she sits down it does improve. She denies any heavy lifting or trauma to her back recently. When she stands for prolonged period of time in addition to the back becoming painful she also feels a heaviness in her legs. She denies any recent fevers. Denies any change in defecation or urination. She last had a transfusion 2 weeks ago where she recieved 1 unit of blood.   Past Medical History  Diagnosis Date  . Sickle cell anemia   . Vitamin D deficiency   . Hypertension   . Osteoporosis 2011    osteopenia  . 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 Cheree [facility] at [facility]  . Gout   . Sickle cell anemia   . Lichen planus   . SCA-1 (spinocerebellar ataxia type 1)     Patient Active Problem List   Diagnosis Date Noted  . Anemia of chronic renal failure 10/17/2014  . Hb-SS disease without crisis 10/17/2014  . Iron overload 10/17/2014  . Left hip pain 12/12/2013  . Screening for colon cancer 08/04/2012  . Screening for breast cancer 08/04/2012  . Renal insufficiency 11/26/2011  . Edema 11/19/2011  . Sickle cell anemia 07/23/2011  . Atrial myxoma 07/23/2011    Past Surgical History  Procedure Laterality Date   . Tubal ligation    . Gallbladder surgery      Current Outpatient Rx  Name  Route  Sig  Dispense  Refill  . albuterol (PROVENTIL HFA;VENTOLIN HFA) 108 (90 BASE) MCG/ACT inhaler   Inhalation   Inhale 2 puffs into the lungs every 6 (six) hours as needed.   18 g   6   . allopurinol (ZYLOPRIM) 100 MG tablet               . calcitRIOL (ROCALTROL) 0.25 MCG capsule   Oral   Take 0.25 mcg by mouth 3 (three) times a week. Monday, Wed and Fri         . deferasirox (EXJADE) 500 MG disintegrating tablet   Oral   Take by mouth daily before breakfast.         . folic acid (FOLVITE) 1 MG tablet   Oral   Take 1 mg by mouth daily.         . furosemide (LASIX) 20 MG tablet   Oral   Take 20 mg by mouth. Take 3 tablets (60mg ) by mouth 2 times daily         . hydroxyurea (HYDREA) 500 MG capsule   Oral   Take 500 mg by mouth daily. May take with food to minimize GI side effects.         Marland Kitchen KIONEX 15 GM/60ML suspension               .  ramipril (ALTACE) 5 MG capsule   Oral   Take 5 mg by mouth daily.         . traMADol (ULTRAM) 50 MG tablet   Oral   Take 50 mg by mouth.         . varenicline (CHANTIX) 0.5 MG tablet   Oral   Take 0.5 mg by mouth 2 (two) times daily.           Allergies Review of patient's allergies indicates no known allergies.  Family History  Problem Relation Age of Onset  . Heart disease Father   . Diabetes Father   . Diabetes Sister     Social History History  Substance Use Topics  . Smoking status: Current Every Day Smoker -- 0.50 packs/day for 30 years    Types: Cigarettes  . Smokeless tobacco: Never Used  . Alcohol Use: No    Review of Systems  Constitutional: Negative for fever. Cardiovascular: Negative for chest pain. Respiratory: Positive for cough Gastrointestinal: Negative for abdominal pain, vomiting and diarrhea. Positive for abdominal swelling Genitourinary: Negative for dysuria. Musculoskeletal: Positive for  back pain Skin: Negative for rash. Neurological: Negative for headaches, focal weakness or numbness.   10-point ROS otherwise negative.  ____________________________________________   PHYSICAL EXAM:  VITAL SIGNS: ED Triage Vitals  Enc Vitals Group     BP 12/30/14 1948 121/58 mmHg     Pulse Rate 12/30/14 1948 93     Resp 12/30/14 1948 20     Temp 12/30/14 1948 98.3 F (36.8 C)     Temp Source 12/30/14 1948 Oral     SpO2 12/30/14 1948 99 %     Weight 12/30/14 1948 198 lb (89.812 kg)     Height 12/30/14 1948 5\' 7"  (1.702 m)     Head Cir --      Peak Flow --      Pain Score 12/30/14 1949 8   Constitutional: Alert and oriented. Well appearing and in no distress. Eyes: Conjunctivae are normal. PERRL. Normal extraocular movements. ENT   Head: Normocephalic and atraumatic.   Nose: No congestion/rhinnorhea.   Mouth/Throat: Mucous membranes are moist.   Neck: No stridor. Hematological/Lymphatic/Immunilogical: No cervical lymphadenopathy. Cardiovascular: Normal rate, regular rhythm.  No murmurs, rubs, or gallops. Respiratory: Normal respiratory effort without tachypnea nor retractions. Breath sounds are clear and equal bilaterally. No wheezes/rales/rhonchi. Gastrointestinal: Soft and nontender. No distention.  Genitourinary: Deferred Musculoskeletal: Normal range of motion in all extremities. No joint effusions.  No lower extremity tenderness nor edema. Mild tenderness to palpation of the midthoracic spine and paraspinous muscles. Neurologic:  Normal speech and language. No gross focal neurologic deficits are appreciated. Speech is normal.  Skin:  Skin is warm, dry and intact. No rash noted. Psychiatric: Mood and affect are normal. Speech and behavior are normal. Patient exhibits appropriate insight and judgment.  ____________________________________________    LABS (pertinent positives/negatives)  Labs Reviewed  LIPASE, BLOOD - Abnormal; Notable for the following:     Lipase 55 (*)    All other components within normal limits  COMPREHENSIVE METABOLIC PANEL - Abnormal; Notable for the following:    Glucose, Bld 102 (*)    BUN 50 (*)    Creatinine, Ser 2.15 (*)    ALT 13 (*)    Total Bilirubin 5.3 (*)    GFR calc non Af Amer 23 (*)    GFR calc Af Amer 27 (*)    All other components within normal limits  CBC - Abnormal;  Notable for the following:    RBC 1.71 (*)    Hemoglobin 5.9 (*)    HCT 17.5 (*)    MCV 102.4 (*)    MCH 34.8 (*)    RDW 23.5 (*)    All other components within normal limits  URINALYSIS COMPLETEWITH MICROSCOPIC ([facility] ONLY) - Abnormal; Notable for the following:    Color, Urine YELLOW (*)    APPearance CLEAR (*)    Bacteria, UA RARE (*)    Squamous Epithelial / LPF 0-5 (*)    All other components within normal limits      ____________________________________________   EKG  I, Nance Pear, attending physician, personally viewed and interpreted this EKG  EKG Time: 2002 Rate: 98 Rhythm: unclear, although I think there are p waves evident in V1 Axis: left axis Intervals: qtc 495 QRS: left anterior fascicular block ST changes: no st elevation ____________________________________________    RADIOLOGY  CXR IMPRESSION: Moderate cardiomegaly with evidence of mild vascular congestion.  Calcified mass over the left atrium slightly larger compatible with known left atrial myxoma. Evidence of  Possible pulmonary nodule over the lateral left mid to lower lung. Recommend chest CT for further evaluation.  Stable prominence of the main pulmonary artery segment likely indicating pulmonary arterial hypertension.  Right IJ suggest catheter looped over the right neck base with tip over the left brachiocephalic vein.  ____________________________________________   PROCEDURES  Procedure(s) performed: None  Critical Care performed: No  ____________________________________________   INITIAL IMPRESSION /  ASSESSMENT AND PLAN / ED COURSE  Pertinent labs & imaging results that were available during my care of the patient were reviewed by me and considered in my medical decision making (see chart for details).  Patient presents to emergency department with multiple medical problems. It appears the most acute is back pain. Thoracic spine x-ray without any acute findings. Chest x-ray did show a small nodule that they recommended following up with CT scan I did explain this to patient. Blood work with hemoglobin of 5.9 however patient does not think she requires a transfusion at this time. Encouraged PCP follow up.  ____________________________________________   FINAL CLINICAL IMPRESSION(S) / ED DIAGNOSES  Final diagnoses:  Cough  Bilateral thoracic back pain     Nance Pear, MD 12/31/14 0157

## 2015-01-02 ENCOUNTER — Other Ambulatory Visit: Payer: Self-pay | Admitting: *Deleted

## 2015-01-02 ENCOUNTER — Inpatient Hospital Stay: Payer: BLUE CROSS/BLUE SHIELD

## 2015-01-02 ENCOUNTER — Inpatient Hospital Stay: Payer: BLUE CROSS/BLUE SHIELD | Attending: Internal Medicine

## 2015-01-02 VITALS — BP 126/47 | Temp 97.9°F

## 2015-01-02 DIAGNOSIS — D631 Anemia in chronic kidney disease: Secondary | ICD-10-CM | POA: Insufficient documentation

## 2015-01-02 DIAGNOSIS — N189 Chronic kidney disease, unspecified: Principal | ICD-10-CM

## 2015-01-02 DIAGNOSIS — Z79899 Other long term (current) drug therapy: Secondary | ICD-10-CM | POA: Diagnosis not present

## 2015-01-02 DIAGNOSIS — D571 Sickle-cell disease without crisis: Secondary | ICD-10-CM

## 2015-01-02 LAB — HEMOGLOBIN: HEMOGLOBIN: 5.8 g/dL — AB (ref 12.0–16.0)

## 2015-01-02 LAB — SAMPLE TO BLOOD BANK

## 2015-01-02 MED ORDER — EPOETIN ALFA 40000 UNIT/ML IJ SOLN
40000.0000 [IU] | Freq: Once | INTRAMUSCULAR | Status: AC
  Administered 2015-01-02: 40000 [IU] via SUBCUTANEOUS

## 2015-01-03 ENCOUNTER — Encounter: Payer: Self-pay | Admitting: Internal Medicine

## 2015-01-03 ENCOUNTER — Ambulatory Visit (INDEPENDENT_AMBULATORY_CARE_PROVIDER_SITE_OTHER): Payer: BLUE CROSS/BLUE SHIELD | Admitting: Internal Medicine

## 2015-01-03 VITALS — BP 107/56 | HR 89 | Temp 97.9°F | Ht 65.0 in | Wt 193.2 lb

## 2015-01-03 DIAGNOSIS — N289 Disorder of kidney and ureter, unspecified: Secondary | ICD-10-CM | POA: Diagnosis not present

## 2015-01-03 DIAGNOSIS — R938 Abnormal findings on diagnostic imaging of other specified body structures: Secondary | ICD-10-CM

## 2015-01-03 DIAGNOSIS — R9389 Abnormal findings on diagnostic imaging of other specified body structures: Secondary | ICD-10-CM

## 2015-01-03 DIAGNOSIS — M545 Low back pain, unspecified: Secondary | ICD-10-CM

## 2015-01-03 DIAGNOSIS — D571 Sickle-cell disease without crisis: Secondary | ICD-10-CM | POA: Diagnosis not present

## 2015-01-03 NOTE — Assessment & Plan Note (Signed)
Bilateral lower back pain. Suspect that sickle cell crisis might be contributing. Symptoms may improve with transfusion next week. Will get plain xrays of the lumbar spine. Discussed adding pain medication, however she declines. Follow up in 2 weeks.

## 2015-01-03 NOTE — Progress Notes (Signed)
Pre visit review using our clinic review tool, if applicable. No additional management support is needed unless otherwise documented below in the visit note. 

## 2015-01-03 NOTE — Assessment & Plan Note (Signed)
CXR showed possible pulmonary nodule. Will set up follow up chest CT for further evaluation.

## 2015-01-03 NOTE — Assessment & Plan Note (Signed)
Lab Results  Component Value Date   CREATININE 2.15* 12/30/2014   Recent renal function showed slight decline. Will plan to repeat in 4 weeks. Continue follow up with Dr. Rolly Salter.

## 2015-01-03 NOTE — Patient Instructions (Signed)
We will set up CT of the chest.  Please follow up with Dr. Ma Hillock next week for transfusion.

## 2015-01-03 NOTE — Progress Notes (Signed)
Subjective:    Patient ID: Jane Short, female    DOB: [birth year], 64 y.o.   MRN: [id number]  HPI  64YO female presents for follow up.  Recently had increased cough and stiffness in lower back.  Went to the ED. CXR showed no acute process. Xray of mid back showed degenerative disease. Hgb was 5.8. Last transfusion 2 weeks ago, however only received 1 unit. Plans to have transfusion next week.  Not taking anything for pain. Pain described as aching which gets worse with prolonged standing. Cough has improved.  Dr. Rolly Salter recently decreased Furosemide intake. She notes some fluid retention with this. Now taking 60mg  of Furosemide at night. Cr recently 2. Has follow up with Dr. Rolly Salter in 03/2015.  Quit smoking on Sunday. Has had 2 cigarettes since. Decided not to use Chantix.  Wt Readings from Last 3 Encounters:  01/03/15 193 lb 4 oz (87.658 kg)  12/30/14 198 lb (89.812 kg)  12/14/14 195 lb (88.451 kg)     Past medical, surgical, family and social history per today's encounter.   Review of Systems  Constitutional: Positive for fatigue. Negative for fever, chills, appetite change and unexpected weight change.  Eyes: Negative for visual disturbance.  Respiratory: Positive for cough. Negative for shortness of breath.   Cardiovascular: Negative for chest pain and leg swelling.  Gastrointestinal: Positive for abdominal distention. Negative for nausea, vomiting, abdominal pain, diarrhea, constipation and blood in stool.  Musculoskeletal: Positive for myalgias, back pain and arthralgias.  Skin: Negative for color change and rash.  Neurological: Negative for weakness and numbness.  Hematological: Negative for adenopathy. Does not bruise/bleed easily.  Psychiatric/Behavioral: Negative for dysphoric mood. The patient is not nervous/anxious.        Objective:    BP 107/56 mmHg  Pulse 89  Temp(Src) 97.9 F (36.6 C) (Oral)  Ht 5\' 5"  (1.651 m)  Wt 193 lb 4 oz (87.658 kg)   BMI 32.16 kg/m2  SpO2 91% Physical Exam  Constitutional: She is oriented to person, place, and time. She appears well-developed and well-nourished. No distress.  HENT:  Head: Normocephalic and atraumatic.  Right Ear: External ear normal.  Left Ear: External ear normal.  Nose: Nose normal.  Mouth/Throat: Oropharynx is clear and moist. No oropharyngeal exudate.  Eyes: Conjunctivae are normal. Pupils are equal, round, and reactive to light. Right eye exhibits no discharge. Left eye exhibits no discharge. No scleral icterus.  Neck: Normal range of motion. Neck supple. No tracheal deviation present. No thyromegaly present.  Cardiovascular: Normal rate, regular rhythm, normal heart sounds and intact distal pulses.  Exam reveals no gallop and no friction rub.   No murmur heard. Pulmonary/Chest: Effort normal and breath sounds normal. No accessory muscle usage. No tachypnea. No respiratory distress. She has no decreased breath sounds. She has no wheezes. She has no rhonchi. She has no rales. She exhibits no tenderness.  Abdominal: Soft. Normal appearance and bowel sounds are normal. There is no tenderness.  Musculoskeletal: Normal range of motion. She exhibits no edema or tenderness.  Lymphadenopathy:    She has no cervical adenopathy.  Neurological: She is alert and oriented to person, place, and time. No cranial nerve deficit. She exhibits normal muscle tone. Coordination normal.  Skin: Skin is warm and dry. No rash noted. She is not diaphoretic. No erythema. No pallor.  Psychiatric: She has a normal mood and affect. Her behavior is normal. Judgment and thought content normal.  Assessment & Plan:   Problem List Items Addressed This Visit      Unprioritized   Abnormal CXR - Primary    CXR showed possible pulmonary nodule. Will set up follow up chest CT for further evaluation.      Relevant Orders   CT Chest Wo Contrast   Bilateral low back pain without sciatica    Bilateral  lower back pain. Suspect that sickle cell crisis might be contributing. Symptoms may improve with transfusion next week. Will get plain xrays of the lumbar spine. Discussed adding pain medication, however she declines. Follow up in 2 weeks.      Relevant Orders   DG Lumbar Spine Complete   Hb-SS disease without crisis    Reviewed recent hematology notes. Recent Hgb 5.8. She is scheduled for a transfusion next week.      Renal insufficiency    Lab Results  Component Value Date   CREATININE 2.15* 12/30/2014   Recent renal function showed slight decline. Will plan to repeat in 4 weeks. Continue follow up with Dr. Rolly Salter.          Return in about 2 weeks (around 01/17/2015) for Recheck.

## 2015-01-03 NOTE — Assessment & Plan Note (Signed)
Reviewed recent hematology notes. Recent Hgb 5.8. She is scheduled for a transfusion next week.

## 2015-01-09 ENCOUNTER — Inpatient Hospital Stay: Payer: BLUE CROSS/BLUE SHIELD

## 2015-01-09 ENCOUNTER — Other Ambulatory Visit: Payer: Self-pay | Admitting: Internal Medicine

## 2015-01-09 VITALS — BP 116/65 | HR 88 | Resp 20

## 2015-01-09 DIAGNOSIS — N189 Chronic kidney disease, unspecified: Secondary | ICD-10-CM | POA: Diagnosis not present

## 2015-01-09 DIAGNOSIS — D631 Anemia in chronic kidney disease: Secondary | ICD-10-CM

## 2015-01-09 DIAGNOSIS — D571 Sickle-cell disease without crisis: Secondary | ICD-10-CM

## 2015-01-09 DIAGNOSIS — D649 Anemia, unspecified: Secondary | ICD-10-CM

## 2015-01-09 LAB — SAMPLE TO BLOOD BANK

## 2015-01-09 LAB — HEMOGLOBIN: Hemoglobin: 5.5 g/dL — ABNORMAL LOW (ref 12.0–16.0)

## 2015-01-09 MED ORDER — EPOETIN ALFA 40000 UNIT/ML IJ SOLN
40000.0000 [IU] | Freq: Once | INTRAMUSCULAR | Status: AC
  Administered 2015-01-09: 40000 [IU] via SUBCUTANEOUS

## 2015-01-10 ENCOUNTER — Encounter: Payer: Self-pay | Admitting: Internal Medicine

## 2015-01-10 ENCOUNTER — Telehealth: Payer: Self-pay | Admitting: *Deleted

## 2015-01-10 ENCOUNTER — Other Ambulatory Visit: Payer: Self-pay | Admitting: *Deleted

## 2015-01-10 ENCOUNTER — Ambulatory Visit
Admission: RE | Admit: 2015-01-10 | Discharge: 2015-01-10 | Disposition: A | Discharge status: Home / Self Care | Payer: BLUE CROSS/BLUE SHIELD | Source: Ambulatory Visit | Attending: Internal Medicine | Admitting: Internal Medicine

## 2015-01-10 DIAGNOSIS — D571 Sickle-cell disease without crisis: Secondary | ICD-10-CM

## 2015-01-10 DIAGNOSIS — I700 Atherosclerosis of aorta: Secondary | ICD-10-CM | POA: Diagnosis not present

## 2015-01-10 DIAGNOSIS — R9389 Abnormal findings on diagnostic imaging of other specified body structures: Secondary | ICD-10-CM

## 2015-01-10 DIAGNOSIS — I517 Cardiomegaly: Secondary | ICD-10-CM | POA: Diagnosis not present

## 2015-01-10 DIAGNOSIS — R938 Abnormal findings on diagnostic imaging of other specified body structures: Secondary | ICD-10-CM | POA: Diagnosis present

## 2015-01-10 LAB — PREPARE RBC (CROSSMATCH)

## 2015-01-10 NOTE — Telephone Encounter (Signed)
Called to inquire if her transfusion orders were in computer and I told her they were in for 1 unit, she replied, "Oh NO. I am supposed to get 2 units, Dr Gilford Rile said that I needed my blood to be up and I only got 1 unit last time and it did no good. This is affecting everything, I am in renal failure .Marland KitchenMarland KitchenI want ot talk to Dr Ma Hillock " I explained that he is off this afternoon, but would call his nurse.  I spoke with Judeen Hammans who said she will talk to him when he calls her.

## 2015-01-10 NOTE — Telephone Encounter (Signed)
I checked with pandit about 2 units and I called blood bank and they already have 2 units for pt.  Orders entered for 2nd unit of blood, called blood bank and they have order and a total of 2 units and I have called pt and let her know that we added on total 2 units all together and all orders are in there

## 2015-01-11 ENCOUNTER — Ambulatory Visit: Payer: BLUE CROSS/BLUE SHIELD

## 2015-01-11 ENCOUNTER — Other Ambulatory Visit: Payer: Self-pay | Admitting: Family Medicine

## 2015-01-11 ENCOUNTER — Inpatient Hospital Stay: Payer: BLUE CROSS/BLUE SHIELD

## 2015-01-11 VITALS — BP 110/64 | HR 112 | Temp 98.1°F | Resp 20

## 2015-01-11 DIAGNOSIS — N189 Chronic kidney disease, unspecified: Secondary | ICD-10-CM | POA: Diagnosis not present

## 2015-01-11 DIAGNOSIS — D508 Other iron deficiency anemias: Secondary | ICD-10-CM

## 2015-01-11 DIAGNOSIS — D649 Anemia, unspecified: Secondary | ICD-10-CM

## 2015-01-11 DIAGNOSIS — D571 Sickle-cell disease without crisis: Secondary | ICD-10-CM

## 2015-01-11 LAB — PREPARE RBC (CROSSMATCH)

## 2015-01-11 MED ORDER — SODIUM CHLORIDE 0.9 % IV SOLN
15.0000 mg/kg/h | Freq: Once | INTRAVENOUS | Status: AC
  Administered 2015-01-11: 15 mg/kg/h via INTRAVENOUS
  Filled 2015-01-11: qty 2

## 2015-01-11 MED ORDER — SODIUM CHLORIDE 0.9 % IV SOLN
250.0000 mL | Freq: Once | INTRAVENOUS | Status: AC
  Administered 2015-01-11: 250 mL via INTRAVENOUS
  Filled 2015-01-11: qty 250

## 2015-01-11 MED ORDER — ACETAMINOPHEN 325 MG PO TABS
650.0000 mg | ORAL_TABLET | Freq: Once | ORAL | Status: AC
  Administered 2015-01-11: 650 mg via ORAL
  Filled 2015-01-11: qty 2

## 2015-01-11 MED ORDER — DIPHENHYDRAMINE HCL 25 MG PO CAPS
25.0000 mg | ORAL_CAPSULE | Freq: Once | ORAL | Status: AC
  Administered 2015-01-11: 25 mg via ORAL
  Filled 2015-01-11: qty 1

## 2015-01-11 MED ORDER — SODIUM CHLORIDE 0.9 % IJ SOLN
10.0000 mL | INTRAMUSCULAR | Status: AC | PRN
  Administered 2015-01-11: 10 mL
  Filled 2015-01-11: qty 10

## 2015-01-11 MED ORDER — HEPARIN SOD (PORK) LOCK FLUSH 100 UNIT/ML IV SOLN
500.0000 [IU] | Freq: Once | INTRAVENOUS | Status: AC
  Administered 2015-01-11: 500 [IU] via INTRAVENOUS

## 2015-01-11 MED ORDER — HEPARIN SOD (PORK) LOCK FLUSH 100 UNIT/ML IV SOLN
500.0000 [IU] | Freq: Every day | INTRAVENOUS | Status: AC | PRN
  Administered 2015-01-11: 500 [IU]
  Filled 2015-01-11: qty 10

## 2015-01-15 LAB — TYPE AND SCREEN
ABO/RH(D): O POS
Antibody Screen: NEGATIVE
UNIT DIVISION: 0
Unit division: 0

## 2015-01-16 ENCOUNTER — Inpatient Hospital Stay: Payer: BLUE CROSS/BLUE SHIELD

## 2015-01-16 VITALS — BP 115/61 | HR 109 | Resp 20

## 2015-01-16 DIAGNOSIS — N189 Chronic kidney disease, unspecified: Principal | ICD-10-CM

## 2015-01-16 DIAGNOSIS — D631 Anemia in chronic kidney disease: Secondary | ICD-10-CM

## 2015-01-16 DIAGNOSIS — D571 Sickle-cell disease without crisis: Secondary | ICD-10-CM

## 2015-01-16 LAB — SAMPLE TO BLOOD BANK

## 2015-01-16 LAB — HEMOGLOBIN: Hemoglobin: 7 g/dL — ABNORMAL LOW (ref 12.0–16.0)

## 2015-01-16 MED ORDER — EPOETIN ALFA 40000 UNIT/ML IJ SOLN
40000.0000 [IU] | Freq: Once | INTRAMUSCULAR | Status: AC
  Administered 2015-01-16: 40000 [IU] via SUBCUTANEOUS
  Filled 2015-01-16: qty 1

## 2015-01-17 ENCOUNTER — Encounter: Payer: Self-pay | Admitting: Internal Medicine

## 2015-01-17 ENCOUNTER — Ambulatory Visit (INDEPENDENT_AMBULATORY_CARE_PROVIDER_SITE_OTHER): Payer: BLUE CROSS/BLUE SHIELD | Admitting: Internal Medicine

## 2015-01-17 VITALS — BP 94/58 | HR 87 | Temp 98.3°F | Ht 65.0 in | Wt 197.0 lb

## 2015-01-17 DIAGNOSIS — R05 Cough: Secondary | ICD-10-CM

## 2015-01-17 DIAGNOSIS — N183 Chronic kidney disease, stage 3 unspecified: Secondary | ICD-10-CM

## 2015-01-17 DIAGNOSIS — N184 Chronic kidney disease, stage 4 (severe): Secondary | ICD-10-CM | POA: Insufficient documentation

## 2015-01-17 DIAGNOSIS — R059 Cough, unspecified: Secondary | ICD-10-CM

## 2015-01-17 DIAGNOSIS — Z1239 Encounter for other screening for malignant neoplasm of breast: Secondary | ICD-10-CM

## 2015-01-17 DIAGNOSIS — I700 Atherosclerosis of aorta: Secondary | ICD-10-CM | POA: Diagnosis not present

## 2015-01-17 NOTE — Progress Notes (Signed)
Subjective:    Patient ID: Jane Short, female    DOB: 1950/10/31, 64 y.o.   MRN: [id number]  HPI  64YO female with HgbSS presents for follow up.  Recently evaluated in the ED for cough. CXR was abnormal. Recently had CT chest in follow up which showed stable cardiomegaly and atherosclerosis of the aorta.  Continues to have dry cough. Made worse by physical activity and hot weather. Improved with rest. Notes some LE swelling, worse than usual. Taking Furosemide only once daily. Has ECHO scheduled in early September with Dr. Clayborn Bigness. No chest pain.  Had PRBC last week. Hgb 7. Energy level improved with this.  CKD - Follow up scheduled with Dr. Rolly Salter in October.  Wt Readings from Last 3 Encounters:  01/17/15 197 lb (89.359 kg)  01/03/15 193 lb 4 oz (87.658 kg)  12/30/14 198 lb (89.812 kg)     BP Readings from Last 3 Encounters:  01/17/15 94/58  01/16/15 115/61  01/11/15 110/64     Past Medical History  Diagnosis Date  . Sickle cell anemia   . Vitamin D deficiency   . Hypertension   . Osteoporosis 2011    osteopenia  . 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 Cheree [facility] at [facility]  . Gout   . Sickle cell anemia   . Lichen planus   . SCA-1 (spinocerebellar ataxia type 1)    Family History  Problem Relation Age of Onset  . Heart disease Father   . Diabetes Father   . Diabetes Sister    Past Surgical History  Procedure Laterality Date  . Tubal ligation    . Gallbladder surgery     Social History   Social History  . Marital Status: Married    Spouse Name: N/A  . Number of Children: 1  . Years of Education: N/A   Occupational History  .      textiles   Social History Main Topics  . Smoking status: Current Every Day Smoker -- 0.50 packs/day for 30 years    Types: Cigarettes  . Smokeless tobacco: Never Used  . Alcohol Use: No  . Drug Use: No  . Sexual Activity: Not Asked   Other Topics Concern  . None   Social History Narrative   Regular  Exercise -  NO   Daily Caffeine Use:  1 cup coffee          Review of Systems  Constitutional: Positive for fatigue. Negative for fever, chills, appetite change and unexpected weight change.  Eyes: Negative for visual disturbance.  Respiratory: Positive for cough and shortness of breath. Negative for chest tightness and wheezing.   Cardiovascular: Positive for leg swelling. Negative for chest pain and palpitations.  Gastrointestinal: Negative for nausea, vomiting, abdominal pain, diarrhea and constipation.  Musculoskeletal: Negative for myalgias and arthralgias.  Skin: Negative for color change and rash.  Hematological: Negative for adenopathy. Does not bruise/bleed easily.  Psychiatric/Behavioral: Negative for sleep disturbance and dysphoric mood. The patient is not nervous/anxious.        Objective:    BP 94/58 mmHg  Pulse 87  Temp(Src) 98.3 F (36.8 C) (Oral)  Ht 5\' 5"  (1.651 m)  Wt 197 lb (89.359 kg)  BMI 32.78 kg/m2  SpO2 94% Physical Exam  Constitutional: She is oriented to person, place, and time. She appears well-developed and well-nourished. No distress.  HENT:  Head: Normocephalic and atraumatic.  Right Ear: External ear normal.  Left Ear: External ear normal.  Nose: Nose normal.  Mouth/Throat: Oropharynx is clear and moist. No oropharyngeal exudate.  Eyes: Conjunctivae are normal. Pupils are equal, round, and reactive to light. Right eye exhibits no discharge. Left eye exhibits no discharge. No scleral icterus.  Neck: Normal range of motion. Neck supple. No tracheal deviation present. No thyromegaly present.  Cardiovascular: Normal rate, regular rhythm, normal heart sounds and intact distal pulses.  Exam reveals no gallop and no friction rub.   No murmur heard. Pulmonary/Chest: Effort normal. No accessory muscle usage. No respiratory distress. She has decreased breath sounds (prolonged expiration). She has no wheezes. She has no rhonchi. She has no rales. She  exhibits no tenderness.  Musculoskeletal: Normal range of motion. She exhibits no edema or tenderness.  Lymphadenopathy:    She has no cervical adenopathy.  Neurological: She is alert and oriented to person, place, and time. No cranial nerve deficit. She exhibits normal muscle tone. Coordination normal.  Skin: Skin is warm and dry. No rash noted. She is not diaphoretic. No erythema. No pallor.  Psychiatric: She has a normal mood and affect. Her behavior is normal. Judgment and thought content normal.          Assessment & Plan:   Problem List Items Addressed This Visit      Unprioritized   Atherosclerosis of aorta - Primary    Discussed findings of atherosclerosis of aorta. Will start Crestor. Follow up with cardiology. Encouraged smoking cessation.      Relevant Medications   rosuvastatin (CRESTOR) 5 MG tablet   Other Relevant Orders   Comprehensive metabolic panel   Lipid panel   Chronic kidney disease (CKD), stage III (moderate)    Repeat renal function with labs today. Follow up with Dr. Rolly Salter as scheduled.      Cough    Chronic cough. CT chest showed no acute findings, stable cardiomegaly and atherosclerosis. Will set up PFTs given her smoking history. Discussed adding a long acting bronchodilator. Encouraged her to follow up with Dr. Marcelline Deist for repeat ECHO to assess LV function. Will also start statin giving atherosclerosis on CT.      Relevant Orders   Ambulatory referral to Pulmonology   Screening for breast cancer   Relevant Orders   MM Digital Screening       Return in about 6 weeks (around 02/28/2015) for Recheck.

## 2015-01-17 NOTE — Progress Notes (Signed)
Pre visit review using our clinic review tool, if applicable. No additional management support is needed unless otherwise documented below in the visit note. 

## 2015-01-17 NOTE — Patient Instructions (Signed)
Labs today.  Take a second dose of Furosemide 20mg  at lunchtime for the next 3 days.  We will set up evaluation with pulmonary medicine.

## 2015-01-18 LAB — LIPID PANEL
CHOL/HDL RATIO: 4
CHOLESTEROL: 108 mg/dL (ref 0–200)
HDL: 26.1 mg/dL — ABNORMAL LOW (ref >39.00)
LDL CALC: 64 mg/dL (ref 0–99)
NONHDL: 82.38
Triglycerides: 94 mg/dL (ref 0.0–149.0)
VLDL: 18.8 mg/dL (ref 0.0–40.0)

## 2015-01-18 LAB — COMPREHENSIVE METABOLIC PANEL
ALBUMIN: 3.6 g/dL (ref 3.5–5.2)
ALT: 10 U/L (ref 0–35)
AST: 21 U/L (ref 0–37)
Alkaline Phosphatase: 100 U/L (ref 39–117)
BUN: 41 mg/dL — AB (ref 6–23)
CHLORIDE: 104 meq/L (ref 96–112)
CO2: 26 meq/L (ref 19–32)
CREATININE: 1.94 mg/dL — AB (ref 0.40–1.20)
Calcium: 9.4 mg/dL (ref 8.4–10.5)
GFR: 33.42 mL/min — ABNORMAL LOW (ref >60.00)
GLUCOSE: 84 mg/dL (ref 70–99)
POTASSIUM: 4.5 meq/L (ref 3.5–5.1)
SODIUM: 137 meq/L (ref 135–145)
Total Bilirubin: 3.4 mg/dL — ABNORMAL HIGH (ref 0.2–1.2)
Total Protein: 6.5 g/dL (ref 6.0–8.3)

## 2015-01-18 MED ORDER — ROSUVASTATIN CALCIUM 5 MG PO TABS: 5.0000 mg | ORAL_TABLET | Freq: Every day | ORAL | Status: DC

## 2015-01-18 NOTE — Assessment & Plan Note (Signed)
Repeat renal function with labs today. Follow up with Dr. Rolly Salter as scheduled.

## 2015-01-18 NOTE — Assessment & Plan Note (Signed)
Discussed findings of atherosclerosis of aorta. Will start Crestor. Follow up with cardiology. Encouraged smoking cessation.

## 2015-01-18 NOTE — Assessment & Plan Note (Signed)
Chronic cough. CT chest showed no acute findings, stable cardiomegaly and atherosclerosis. Will set up PFTs given her smoking history. Discussed adding a long acting bronchodilator. Encouraged her to follow up with Dr. Marcelline Deist for repeat ECHO to assess LV function. Will also start statin giving atherosclerosis on CT.

## 2015-01-23 ENCOUNTER — Other Ambulatory Visit: Payer: Self-pay | Admitting: *Deleted

## 2015-01-23 ENCOUNTER — Inpatient Hospital Stay: Payer: BLUE CROSS/BLUE SHIELD

## 2015-01-23 VITALS — BP 112/61 | HR 99 | Resp 20

## 2015-01-23 DIAGNOSIS — N189 Chronic kidney disease, unspecified: Principal | ICD-10-CM

## 2015-01-23 DIAGNOSIS — D571 Sickle-cell disease without crisis: Secondary | ICD-10-CM

## 2015-01-23 DIAGNOSIS — D631 Anemia in chronic kidney disease: Secondary | ICD-10-CM

## 2015-01-23 LAB — HEMOGLOBIN: HEMOGLOBIN: 6.5 g/dL — AB (ref 12.0–16.0)

## 2015-01-23 LAB — SAMPLE TO BLOOD BANK

## 2015-01-23 MED ORDER — EPOETIN ALFA 40000 UNIT/ML IJ SOLN
40000.0000 [IU] | Freq: Once | INTRAMUSCULAR | Status: AC
  Administered 2015-01-23: 40000 [IU] via SUBCUTANEOUS
  Filled 2015-01-23: qty 1

## 2015-01-29 ENCOUNTER — Encounter: Payer: Self-pay | Admitting: Internal Medicine

## 2015-01-29 ENCOUNTER — Ambulatory Visit (INDEPENDENT_AMBULATORY_CARE_PROVIDER_SITE_OTHER): Payer: BLUE CROSS/BLUE SHIELD | Admitting: Internal Medicine

## 2015-01-29 VITALS — BP 120/60 | HR 82 | Ht 67.0 in | Wt 195.0 lb

## 2015-01-29 DIAGNOSIS — R0602 Shortness of breath: Secondary | ICD-10-CM

## 2015-01-29 MED ORDER — MOMETASONE FURO-FORMOTEROL FUM 200-5 MCG/ACT IN AERO: 2.0000 | INHALATION_SPRAY | Freq: Two times a day (BID) | RESPIRATORY_TRACT | Status: DC

## 2015-01-29 NOTE — Patient Instructions (Signed)

## 2015-01-29 NOTE — Assessment & Plan Note (Addendum)
-  likely COPD -will obtain PFT's -will start dulera -smoking cessation strongly advised

## 2015-01-29 NOTE — Progress Notes (Signed)
Date: 01/29/2015,   MRN# [id number] Jane Short 01-May-1951 Code Status:  Hosp day:@LENGTHOFSTAYDAYS @ Referring MD: @ATDPROV @     PCP:      AdmissionWeight: 195 lb (88.451 kg)                 CurrentWeight: 195 lb (88.451 kg) Jane Short is a 64 y.o. old female seen in consultation for chronic SOB and cough.     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 Sickle cell anemia   . Vitamin D deficiency   . Hypertension   . Osteoporosis 2011    osteopenia  . 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 Cheree [facility] at [facility]  . Gout   . Sickle cell anemia   . Lichen planus   . SCA-1 (spinocerebellar ataxia type 1)      SURGICAL HISTORY   Past Surgical History  Procedure Laterality Date  . Tubal ligation    . Gallbladder surgery       FAMILY HISTORY   Family History  Problem Relation Age of Onset  . Heart disease Father   . Diabetes Father   . Diabetes Sister      SOCIAL HISTORY   Social History  Substance Use Topics  . Smoking status: Current Every Day Smoker -- 0.25 packs/day for 30 years    Types: Cigarettes  . Smokeless tobacco: Never Used  . Alcohol Use: No     MEDICATIONS    Home Medication:  Current Outpatient Rx  Name  Route  Sig  Dispense  Refill  . albuterol (PROVENTIL HFA;VENTOLIN HFA) 108 (90 BASE) MCG/ACT inhaler   Inhalation   Inhale  2 puffs into the lungs every 6 (six) hours as needed.   18 g   6   . allopurinol (ZYLOPRIM) 100 MG tablet   Oral   Take 100 mg by mouth daily.          . calcitRIOL (ROCALTROL) 0.25 MCG capsule   Oral   Take 0.25 mcg by mouth daily.         Marland Kitchen deferasirox (EXJADE) 500 MG disintegrating tablet   Oral   Take by mouth daily before breakfast.         . folic acid (FOLVITE) 1 MG tablet   Oral   Take 1 mg by mouth daily.         . hydroxyurea (HYDREA) 500 MG capsule   Oral   Take 500 mg by mouth daily. May take with food to minimize GI side effects.         . rosuvastatin (CRESTOR) 5 MG tablet   Oral   Take 1 tablet (5 mg total) by mouth daily.   90 tablet   3   . furosemide (LASIX) 20 MG tablet   Oral   Take 60 mg by mouth  daily.          . mometasone-formoterol (DULERA) 200-5 MCG/ACT AERO   Inhalation   Inhale 2 puffs into the lungs 2 (two) times daily.   1 Inhaler   12   . ramipril (ALTACE) 5 MG capsule   Oral   Take 5 mg by mouth daily.           Current Medication:  Current outpatient prescriptions:  .  albuterol (PROVENTIL HFA;VENTOLIN HFA) 108 (90 BASE) MCG/ACT inhaler, Inhale 2 puffs into the lungs every 6 (six) hours as needed., Disp: 18 g, Rfl: 6 .  allopurinol (ZYLOPRIM) 100 MG tablet, Take 100 mg by mouth daily. , Disp: , Rfl:  .  calcitRIOL (ROCALTROL) 0.25 MCG capsule, Take 0.25 mcg by mouth daily., Disp: , Rfl:  .  deferasirox (EXJADE) 500 MG disintegrating tablet, Take by mouth daily before breakfast., Disp: , Rfl:  .  folic acid (FOLVITE) 1 MG tablet, Take 1 mg by mouth daily., Disp: , Rfl:  .  hydroxyurea (HYDREA) 500 MG capsule, Take 500 mg by mouth daily. May take with food to minimize GI side effects., Disp: , Rfl:  .  rosuvastatin (CRESTOR) 5 MG tablet, Take 1 tablet (5 mg total) by mouth daily., Disp: 90 tablet, Rfl: 3 .  furosemide (LASIX) 20 MG tablet, Take 60 mg by mouth daily. , Disp: , Rfl:  .  mometasone-formoterol (DULERA)  200-5 MCG/ACT AERO, Inhale 2 puffs into the lungs 2 (two) times daily., Disp: 1 Inhaler, Rfl: 12 .  ramipril (ALTACE) 5 MG capsule, Take 5 mg by mouth daily., Disp: , Rfl:  No current facility-administered medications for this visit.  Facility-Administered Medications Ordered in Other Visits:  .  epoetin alfa (EPOGEN,PROCRIT) injection 40,000 Units, 40,000 Units, Subcutaneous, Weekly, Leia Alf, MD, 40,000 Units at 12/12/14 1501 .  heparin lock flush 100 unit/mL, 250 Units, Intracatheter, PRN, Leia Alf, MD .  sodium chloride 0.9 % injection 3 mL, 3 mL, Intracatheter, PRN, Leia Alf, MD, 3 mL at 10/05/14 0851    ALLERGIES   Review of patient's allergies indicates no known allergies.     REVIEW OF SYSTEMS   Review of Systems  Constitutional: Negative for fever, chills, malaise/fatigue and diaphoresis.  HENT: Negative for hearing loss and tinnitus.   Eyes: Negative for blurred vision and double vision.  Respiratory: Positive for cough, sputum production, shortness of breath and wheezing. Negative for hemoptysis.   Cardiovascular: Positive for leg swelling. Negative for chest pain, palpitations and orthopnea.  Gastrointestinal: Negative for heartburn, nausea, vomiting and abdominal pain.  Genitourinary: Negative for dysuria.  Musculoskeletal: Negative for myalgias and neck pain.  Skin: Negative for itching and rash.  Neurological: Negative for dizziness, tingling, weakness and headaches.  Endo/Heme/Allergies: Does not bruise/bleed easily.  Psychiatric/Behavioral: The patient is nervous/anxious.      VS: BP 120/60 mmHg  Pulse 82  Ht 5\' 7"  (1.702 m)  Wt 195 lb (88.451 kg)  BMI 30.53 kg/m2  SpO2 98%     PHYSICAL EXAM  Physical Exam  Constitutional: She is oriented to person, place, and time. She appears well-developed and well-nourished. No distress.  HENT:  Head: Normocephalic and atraumatic.  Mouth/Throat: No oropharyngeal exudate.  Eyes: EOM are normal.  Pupils are equal, round, and reactive to light. No scleral icterus.  Neck: Normal range of motion. Neck supple.  Cardiovascular: Normal rate, regular rhythm and normal heart sounds.   No murmur heard. Pulmonary/Chest: No stridor. No respiratory distress. She has no wheezes.  She has no rales.  Abdominal: Soft. Bowel sounds are normal. She exhibits no distension. There is no tenderness. There is no rebound.  Musculoskeletal: Normal range of motion. She exhibits no edema.  Neurological: She is alert and oriented to person, place, and time. She displays normal reflexes. Coordination normal.  Skin: Skin is warm. She is not diaphoretic.  Psychiatric: She has a normal mood and affect.        LABS    No results for input(s): HGB, HCT, MCV, WBC, POTASSIUM, CHLORIDE, BUN, CREATININE, GLUCOSE, CALCIUM, INR, PTT in the last 72 hours.  Invalid input(s): PLATELET, BANDS, NEUTROPHIL, LYMPHOCYTE, MONOCYTE, EOSINOPHILS, BASOPHIL, SODIUM, BICARBONATE, MAGNESIUM, PHOSPHORUS, PT, SGPT, SGOT,    No results for input(s): PH in the last 72 hours.  Invalid input(s): PCO2, PO2, BASEEXCESS, BASEDEFICITE, TFT    CULTURE RESULTS   No results found for this or any previous visit (from the past 240 hour(s)).        IMAGING    Dg Chest 2 View  12/30/2014   CLINICAL DATA:  Abdominal swelling intermittently over the past 2 months. Productive cough with increasing shortness of breath over the past 3 weeks. Sickle cell disease.  EXAM: CHEST  2 VIEW  COMPARISON:  07/11/2012  FINDINGS: A right IJ central venous catheter is present with small loop over the right neck base. Distal part catheter turns towards the left into the left brachiocephalic vein with tip over the region of the brachiocephalic vein.  Lungs are adequately inflated with mild prominence of the perihilar markings likely mild vascular congestion. Possible nodular density over the lateral left mid to lower lung. There is moderate cardiomegaly  without significant change. Mild prominence of the main pulmonary artery segment unchanged. Calcified left atrium mass slightly larger as patient has a history of left atrial myxoma. Remainder of the exam is unchanged.  IMPRESSION: Moderate cardiomegaly with evidence of mild vascular congestion.  Calcified mass over the left atrium slightly larger compatible with known left atrial myxoma. Evidence of  Possible pulmonary nodule over the lateral left mid to lower lung. Recommend chest CT for further evaluation.  Stable prominence of the main pulmonary artery segment likely indicating pulmonary arterial hypertension.  Right IJ suggest catheter looped over the right neck base with tip over the left brachiocephalic vein.  These results were called by telephone at the time of interpretation on 12/30/2014 at 8:48 pm to Dr. Jimmye Norman, who verbally acknowledged these results.   Electronically Signed   By: Marin Olp M.D.   On: 12/30/2014 20:48   Dg Thoracic Spine 2 View  12/31/2014   CLINICAL DATA:  Back pain.  EXAM: THORACIC SPINE 2 VIEWS  COMPARISON:  Chest radiograph earlier today.  FINDINGS: Right chest port in place, tip currently appears in the region of the brachiocephalic vein. The alignment is maintained. Vertebral body heights are maintained. Diffuse disc space narrowing and endplate spurring throughout the thoracic spine. There is no paravertebral soft tissue abnormality. The heart is enlarged.  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   Electronically Signed   By: Jeb Levering M.D.   On: 12/31/2014 01:14   Ct Chest Wo Contrast  01/10/2015   CLINICAL DATA:  Shortness of breath, cough.  EXAM: CT CHEST WITHOUT CONTRAST  TECHNIQUE: Multidetector CT imaging of the chest was performed following the standard protocol without IV contrast.  COMPARISON:  CT scan of June 23, 2011.  FINDINGS: No pneumothorax or  pleural  effusion is noted. Stable scarring is seen in the lingular segment of the left upper lobe. Probable scarring is noted posteriorly in the right lower lobe. No acute pulmonary disease is noted. Stable cardiomegaly is noted. 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 Atherosclerosis of thoracic aorta is noted without aneurysm formation no significant adenopathy is noted in the mediastinum. Right internal jugular Port-A-Cath is noted with distal tip in the expected position of the SVC. Visualized upper abdomen appears normal. No significant osseous abnormality is noted.  IMPRESSION: Stable cardiomegaly. Atherosclerosis of thoracic aorta without aneurysm formation. No acute cardiopulmonary abnormality seen.   Electronically Signed   By: Marijo Conception, M.D.   On: 01/10/2015 14:20        ASSESSMENT/PLAN    64 yo AAF with chronic cough and  chronic SOB with wheezing with chronic tobacco abuse likely related to underlying COPD  SOB (shortness of breath) -likely COPD -will obtain PFT's -will start dulera -smoking cessation strongly advised       I have personally obtained a history, examined the patient, evaluated laboratory and independently reviewed imaging results, formulated the assessment and plan and placed orders.  The Patient requires high complexity decision making for assessment and support, frequent evaluation and titration of therapies, application of advanced monitoring technologies and extensive interpretation of multiple databases. Time spent with patient 35 minutes.  Patient is satisfied with Plan of action and management.    Corrin Parker, M.D.  [facility]  Medical Director [place] Director [facility] Cardio-Pulmonary Department

## 2015-01-29 NOTE — Progress Notes (Signed)
   Subjective:    Patient ID: Jane Short, female    DOB: 04/29/1951, 64 y.o.   MRN: [id number]  HPI    Review of Systems  Constitutional: Negative for fever and unexpected weight change.  HENT: Negative for congestion, dental problem, ear pain, nosebleeds, postnasal drip, rhinorrhea, sinus pressure, sneezing, sore throat and trouble swallowing.   Eyes: Negative for redness and itching.  Respiratory: Positive for cough, shortness of breath and wheezing. Negative for chest tightness.   Cardiovascular: Negative for palpitations and leg swelling.  Gastrointestinal: Negative for nausea and vomiting.  Genitourinary: Negative for dysuria.  Musculoskeletal: Negative for joint swelling.  Skin: Negative for rash.  Neurological: Negative for headaches.  Hematological: Does not bruise/bleed easily.  Psychiatric/Behavioral: Negative for dysphoric mood. The patient is not nervous/anxious.        Objective:   Physical Exam        Assessment & Plan:

## 2015-01-30 ENCOUNTER — Inpatient Hospital Stay: Payer: BLUE CROSS/BLUE SHIELD

## 2015-01-30 ENCOUNTER — Other Ambulatory Visit: Payer: Self-pay | Admitting: *Deleted

## 2015-01-30 ENCOUNTER — Encounter (INDEPENDENT_AMBULATORY_CARE_PROVIDER_SITE_OTHER): Payer: Self-pay

## 2015-01-30 ENCOUNTER — Encounter: Payer: Self-pay | Admitting: *Deleted

## 2015-01-30 VITALS — BP 133/64 | HR 87 | Temp 97.9°F

## 2015-01-30 DIAGNOSIS — D571 Sickle-cell disease without crisis: Secondary | ICD-10-CM

## 2015-01-30 DIAGNOSIS — N189 Chronic kidney disease, unspecified: Principal | ICD-10-CM

## 2015-01-30 DIAGNOSIS — N289 Disorder of kidney and ureter, unspecified: Secondary | ICD-10-CM

## 2015-01-30 DIAGNOSIS — D631 Anemia in chronic kidney disease: Secondary | ICD-10-CM

## 2015-01-30 LAB — SAMPLE TO BLOOD BANK

## 2015-01-30 LAB — HEMOGLOBIN: HEMOGLOBIN: 6.1 g/dL — AB (ref 12.0–16.0)

## 2015-01-30 MED ORDER — EPOETIN ALFA 40000 UNIT/ML IJ SOLN
40000.0000 [IU] | INTRAMUSCULAR | Status: DC
  Administered 2015-01-30: 40000 [IU] via SUBCUTANEOUS
  Filled 2015-01-30: qty 1

## 2015-02-06 ENCOUNTER — Telehealth: Payer: Self-pay | Admitting: *Deleted

## 2015-02-06 ENCOUNTER — Inpatient Hospital Stay: Payer: BLUE CROSS/BLUE SHIELD | Attending: Internal Medicine

## 2015-02-06 ENCOUNTER — Other Ambulatory Visit: Payer: Self-pay | Admitting: *Deleted

## 2015-02-06 ENCOUNTER — Inpatient Hospital Stay: Payer: BLUE CROSS/BLUE SHIELD

## 2015-02-06 VITALS — BP 115/63 | HR 99 | Temp 97.3°F | Resp 18

## 2015-02-06 DIAGNOSIS — D571 Sickle-cell disease without crisis: Secondary | ICD-10-CM

## 2015-02-06 DIAGNOSIS — N189 Chronic kidney disease, unspecified: Secondary | ICD-10-CM | POA: Diagnosis present

## 2015-02-06 DIAGNOSIS — Z79899 Other long term (current) drug therapy: Secondary | ICD-10-CM | POA: Diagnosis not present

## 2015-02-06 DIAGNOSIS — D631 Anemia in chronic kidney disease: Secondary | ICD-10-CM

## 2015-02-06 LAB — SAMPLE TO BLOOD BANK

## 2015-02-06 LAB — HEMOGLOBIN: Hemoglobin: 6 g/dL — ABNORMAL LOW (ref 12.0–16.0)

## 2015-02-06 MED ORDER — EPOETIN ALFA 40000 UNIT/ML IJ SOLN
40000.0000 [IU] | INTRAMUSCULAR | Status: DC
  Administered 2015-02-06: 40000 [IU] via SUBCUTANEOUS
  Filled 2015-02-06: qty 1

## 2015-02-06 NOTE — Telephone Encounter (Signed)
Pt came in for PFT appt and was unable to get past the FVC Pre. We tried the DLCO and all 4 times she was unable to get thru it because she would start coughing. She states if she can get rid of the cough that she is willing to attempt again. Please advise on recommendations to inform pt of. Thanks.

## 2015-02-06 NOTE — Telephone Encounter (Signed)
May use OTC cough suppressant and re do if able. thanks

## 2015-02-11 ENCOUNTER — Telehealth: Payer: Self-pay | Admitting: *Deleted

## 2015-02-11 ENCOUNTER — Emergency Department: Payer: BLUE CROSS/BLUE SHIELD

## 2015-02-11 DIAGNOSIS — R2242 Localized swelling, mass and lump, left lower limb: Secondary | ICD-10-CM | POA: Diagnosis present

## 2015-02-11 DIAGNOSIS — I10 Essential (primary) hypertension: Secondary | ICD-10-CM | POA: Insufficient documentation

## 2015-02-11 DIAGNOSIS — Z720 Tobacco use: Secondary | ICD-10-CM | POA: Diagnosis not present

## 2015-02-11 NOTE — Telephone Encounter (Signed)
Pt called states her left leg is not any better states it still swelling throughout the day.  Has seen Dr Clayborn Bigness.  Please advise

## 2015-02-11 NOTE — Telephone Encounter (Signed)
Spoke with pt advised of MDs message.  Pt verbalized understanding. 

## 2015-02-11 NOTE — ED Notes (Signed)
Patient ambulatory to triage with steady gait, without difficulty or distress noted; pt reports sent by her PCP to r/o DVT; st swelling and pain to left calf x 56months; hx sickle cell; denies any accomp symptoms; +PP, brisk cap refill, W&D

## 2015-02-11 NOTE — Telephone Encounter (Signed)
Needs to be seen. May need to go to urgent care for [place] evaluation of DVT if persistent swelling. (if [place] was not completed by Dr. Clayborn Bigness)

## 2015-02-12 ENCOUNTER — Emergency Department
Admission: EM | Admit: 2015-02-12 | Discharge: 2015-02-12 | Discharge status: Against Medical Advice | Payer: BLUE CROSS/BLUE SHIELD | Attending: Emergency Medicine | Admitting: Emergency Medicine

## 2015-02-13 ENCOUNTER — Inpatient Hospital Stay: Payer: BLUE CROSS/BLUE SHIELD

## 2015-02-13 ENCOUNTER — Other Ambulatory Visit: Payer: Self-pay | Admitting: Internal Medicine

## 2015-02-13 ENCOUNTER — Telehealth: Payer: Self-pay | Admitting: *Deleted

## 2015-02-13 VITALS — BP 119/65 | HR 90 | Temp 97.0°F | Resp 20

## 2015-02-13 DIAGNOSIS — D631 Anemia in chronic kidney disease: Secondary | ICD-10-CM

## 2015-02-13 DIAGNOSIS — N189 Chronic kidney disease, unspecified: Secondary | ICD-10-CM | POA: Diagnosis not present

## 2015-02-13 DIAGNOSIS — D638 Anemia in other chronic diseases classified elsewhere: Secondary | ICD-10-CM

## 2015-02-13 DIAGNOSIS — D571 Sickle-cell disease without crisis: Secondary | ICD-10-CM

## 2015-02-13 LAB — SAMPLE TO BLOOD BANK

## 2015-02-13 LAB — HEMOGLOBIN: Hemoglobin: 6 g/dL — ABNORMAL LOW (ref 12.0–16.0)

## 2015-02-13 MED ORDER — EPOETIN ALFA 20000 UNIT/ML IJ SOLN
20000.0000 [IU] | Freq: Once | INTRAMUSCULAR | Status: AC
  Administered 2015-02-13: 20000 [IU] via SUBCUTANEOUS
  Filled 2015-02-13: qty 1

## 2015-02-13 MED ORDER — EPOETIN ALFA 40000 UNIT/ML IJ SOLN
40000.0000 [IU] | Freq: Once | INTRAMUSCULAR | Status: AC
  Administered 2015-02-13: 40000 [IU] via SUBCUTANEOUS
  Filled 2015-02-13: qty 1

## 2015-02-13 NOTE — Telephone Encounter (Signed)
Pt called requesting Venous Doppler results from 9.12.16.  Please advise

## 2015-02-13 NOTE — Progress Notes (Signed)
Hemoglobin continues to remain significantly low at 6.0 today. Given this, plan is to increase dose of Procrit to 60000 units subcutaneous every weekly and monitor for response.

## 2015-02-13 NOTE — Telephone Encounter (Signed)
[place] was normal with no DVT

## 2015-02-14 NOTE — Telephone Encounter (Signed)
Spoke with pt, advised of result note.

## 2015-02-20 ENCOUNTER — Inpatient Hospital Stay: Payer: BLUE CROSS/BLUE SHIELD

## 2015-02-20 VITALS — BP 124/65 | HR 81 | Resp 20

## 2015-02-20 DIAGNOSIS — N189 Chronic kidney disease, unspecified: Principal | ICD-10-CM

## 2015-02-20 DIAGNOSIS — D631 Anemia in chronic kidney disease: Secondary | ICD-10-CM

## 2015-02-20 DIAGNOSIS — D571 Sickle-cell disease without crisis: Secondary | ICD-10-CM

## 2015-02-20 LAB — IRON AND TIBC
Iron: 23 ug/dL — ABNORMAL LOW (ref 28–170)
Saturation Ratios: 5 % — ABNORMAL LOW (ref 10.4–31.8)
TIBC: 471 ug/dL — ABNORMAL HIGH (ref 250–450)
UIBC: 448 ug/dL

## 2015-02-20 LAB — FERRITIN: FERRITIN: 14 ng/mL (ref 11–307)

## 2015-02-20 LAB — HEMOGLOBIN: HEMOGLOBIN: 6.4 g/dL — AB (ref 12.0–16.0)

## 2015-02-20 LAB — SAMPLE TO BLOOD BANK

## 2015-02-20 MED ORDER — EPOETIN ALFA 20000 UNIT/ML IJ SOLN
20000.0000 [IU] | Freq: Once | INTRAMUSCULAR | Status: AC
  Administered 2015-02-20: 20000 [IU] via SUBCUTANEOUS
  Filled 2015-02-20: qty 1

## 2015-02-20 MED ORDER — HEPARIN SOD (PORK) LOCK FLUSH 100 UNIT/ML IV SOLN
500.0000 [IU] | Freq: Once | INTRAVENOUS | Status: AC
  Administered 2015-02-20: 500 [IU] via INTRAVENOUS

## 2015-02-20 MED ORDER — SODIUM CHLORIDE 0.9 % IJ SOLN
10.0000 mL | INTRAMUSCULAR | Status: DC | PRN
  Administered 2015-02-20: 10 mL via INTRAVENOUS
  Filled 2015-02-20: qty 10

## 2015-02-20 MED ORDER — HEPARIN SOD (PORK) LOCK FLUSH 100 UNIT/ML IV SOLN
INTRAVENOUS | Status: AC
  Filled 2015-02-20: qty 5

## 2015-02-20 MED ORDER — EPOETIN ALFA 20000 UNIT/ML IJ SOLN: 60000.0000 [IU] | Freq: Once | INTRAMUSCULAR | Status: DC

## 2015-02-20 MED ORDER — EPOETIN ALFA 40000 UNIT/ML IJ SOLN
40000.0000 [IU] | Freq: Once | INTRAMUSCULAR | Status: AC
  Administered 2015-02-20: 40000 [IU] via SUBCUTANEOUS
  Filled 2015-02-20: qty 1

## 2015-02-21 ENCOUNTER — Other Ambulatory Visit: Payer: Self-pay | Admitting: Internal Medicine

## 2015-02-21 DIAGNOSIS — D571 Sickle-cell disease without crisis: Secondary | ICD-10-CM

## 2015-02-21 NOTE — Progress Notes (Signed)
Labs from yesterday shows iron deficiency. Given this, plan is to hold the Exjade. Will pursue IV Feraheme x 2 doses is she is agreeable.  Also will request stool hemoccult x 2 cards. Consider GI eval if positive (last colonoscopy was in 2014). Given iron def, will need to hold Procrit until we correct it, have discontinued the Procrit orders. Have ordered repeat iron study on 03/27/15, and her next MD appt is on 04/03/15 at which time further decision regarding Procrit therapy and Exjade will need to be made based upon the iron study from Oct 26.

## 2015-02-22 ENCOUNTER — Other Ambulatory Visit: Payer: Self-pay | Admitting: *Deleted

## 2015-02-22 ENCOUNTER — Inpatient Hospital Stay: Payer: BLUE CROSS/BLUE SHIELD

## 2015-02-22 DIAGNOSIS — N189 Chronic kidney disease, unspecified: Principal | ICD-10-CM

## 2015-02-22 DIAGNOSIS — D631 Anemia in chronic kidney disease: Secondary | ICD-10-CM

## 2015-02-22 DIAGNOSIS — D509 Iron deficiency anemia, unspecified: Secondary | ICD-10-CM

## 2015-02-22 MED ORDER — SODIUM CHLORIDE 0.9 % IV SOLN
INTRAVENOUS | Status: DC
Start: 2015-02-22 — End: 2015-02-22
  Administered 2015-02-22: 16:00:00 via INTRAVENOUS
  Filled 2015-02-22: qty 1000

## 2015-02-22 MED ORDER — SODIUM CHLORIDE 0.9 % IV SOLN
510.0000 mg | Freq: Once | INTRAVENOUS | Status: AC
  Administered 2015-02-22: 510 mg via INTRAVENOUS
  Filled 2015-02-22: qty 17

## 2015-02-22 MED ORDER — HEPARIN SOD (PORK) LOCK FLUSH 100 UNIT/ML IV SOLN
INTRAVENOUS | Status: AC
  Filled 2015-02-22: qty 5

## 2015-02-26 DIAGNOSIS — N189 Chronic kidney disease, unspecified: Secondary | ICD-10-CM | POA: Diagnosis not present

## 2015-02-27 ENCOUNTER — Inpatient Hospital Stay: Payer: BLUE CROSS/BLUE SHIELD

## 2015-02-27 VITALS — BP 113/61 | HR 72 | Temp 97.5°F | Resp 20

## 2015-02-27 DIAGNOSIS — N189 Chronic kidney disease, unspecified: Secondary | ICD-10-CM | POA: Diagnosis not present

## 2015-02-27 DIAGNOSIS — D509 Iron deficiency anemia, unspecified: Secondary | ICD-10-CM

## 2015-02-27 DIAGNOSIS — D631 Anemia in chronic kidney disease: Secondary | ICD-10-CM

## 2015-02-27 DIAGNOSIS — D571 Sickle-cell disease without crisis: Secondary | ICD-10-CM

## 2015-02-27 LAB — HEMOGLOBIN: HEMOGLOBIN: 7.2 g/dL — AB (ref 12.0–16.0)

## 2015-02-27 LAB — SAMPLE TO BLOOD BANK

## 2015-02-27 LAB — OCCULT BLOOD X 1 CARD TO LAB, STOOL
FECAL OCCULT BLD: NEGATIVE
Fecal Occult Bld: POSITIVE — AB

## 2015-02-27 MED ORDER — HEPARIN SOD (PORK) LOCK FLUSH 100 UNIT/ML IV SOLN
500.0000 [IU] | Freq: Once | INTRAVENOUS | Status: DC
  Filled 2015-02-27: qty 5

## 2015-02-27 MED ORDER — SODIUM CHLORIDE 0.9 % IV SOLN
510.0000 mg | Freq: Once | INTRAVENOUS | Status: AC
  Administered 2015-02-27: 510 mg via INTRAVENOUS
  Filled 2015-02-27: qty 17

## 2015-02-27 MED ORDER — SODIUM CHLORIDE 0.9 % IJ SOLN
10.0000 mL | INTRAMUSCULAR | Status: DC | PRN
  Administered 2015-02-27: 10 mL via INTRAVENOUS
  Filled 2015-02-27: qty 10

## 2015-02-28 ENCOUNTER — Ambulatory Visit: Payer: BLUE CROSS/BLUE SHIELD | Admitting: Internal Medicine

## 2015-03-06 ENCOUNTER — Inpatient Hospital Stay: Payer: BLUE CROSS/BLUE SHIELD | Attending: Internal Medicine

## 2015-03-06 ENCOUNTER — Encounter: Payer: Self-pay | Admitting: Internal Medicine

## 2015-03-06 ENCOUNTER — Ambulatory Visit (INDEPENDENT_AMBULATORY_CARE_PROVIDER_SITE_OTHER): Payer: BLUE CROSS/BLUE SHIELD | Admitting: Internal Medicine

## 2015-03-06 ENCOUNTER — Inpatient Hospital Stay: Payer: BLUE CROSS/BLUE SHIELD

## 2015-03-06 VITALS — BP 116/62 | HR 73 | Temp 97.9°F | Ht 67.0 in | Wt 190.1 lb

## 2015-03-06 DIAGNOSIS — D631 Anemia in chronic kidney disease: Secondary | ICD-10-CM | POA: Insufficient documentation

## 2015-03-06 DIAGNOSIS — N183 Chronic kidney disease, stage 3 unspecified: Secondary | ICD-10-CM

## 2015-03-06 DIAGNOSIS — D571 Sickle-cell disease without crisis: Secondary | ICD-10-CM | POA: Diagnosis not present

## 2015-03-06 DIAGNOSIS — I700 Atherosclerosis of aorta: Secondary | ICD-10-CM

## 2015-03-06 DIAGNOSIS — N189 Chronic kidney disease, unspecified: Secondary | ICD-10-CM | POA: Insufficient documentation

## 2015-03-06 DIAGNOSIS — R0602 Shortness of breath: Secondary | ICD-10-CM

## 2015-03-06 LAB — COMPREHENSIVE METABOLIC PANEL
ALBUMIN: 3.8 g/dL (ref 3.5–5.2)
ALK PHOS: 104 U/L (ref 39–117)
ALT: 13 U/L (ref 0–35)
AST: 26 U/L (ref 0–37)
BILIRUBIN TOTAL: 4.5 mg/dL — AB (ref 0.2–1.2)
BUN: 28 mg/dL — ABNORMAL HIGH (ref 6–23)
CALCIUM: 9.9 mg/dL (ref 8.4–10.5)
CO2: 28 mEq/L (ref 19–32)
Chloride: 105 mEq/L (ref 96–112)
Creatinine, Ser: 1.65 mg/dL — ABNORMAL HIGH (ref 0.40–1.20)
GFR: 40.27 mL/min — AB (ref >60.00)
GLUCOSE: 96 mg/dL (ref 70–99)
POTASSIUM: 4.4 meq/L (ref 3.5–5.1)
Sodium: 140 mEq/L (ref 135–145)
TOTAL PROTEIN: 6.8 g/dL (ref 6.0–8.3)

## 2015-03-06 LAB — HEMOGLOBIN: Hemoglobin: 6.7 g/dL — ABNORMAL LOW (ref 12.0–16.0)

## 2015-03-06 LAB — LIPID PANEL
CHOLESTEROL: 88 mg/dL (ref 0–200)
HDL: 29.1 mg/dL — AB (ref >39.00)
LDL Cholesterol: 43 mg/dL (ref 0–99)
NonHDL: 58.78
TRIGLYCERIDES: 78 mg/dL (ref 0.0–149.0)
Total CHOL/HDL Ratio: 3
VLDL: 15.6 mg/dL (ref 0.0–40.0)

## 2015-03-06 NOTE — Progress Notes (Signed)
Pre visit review using our clinic review tool, if applicable. No additional management support is needed unless otherwise documented below in the visit note. 

## 2015-03-06 NOTE — Assessment & Plan Note (Signed)
Reviewed recent hematology notes. Continue to follow up with hematology for transfusion therapy. Continue Hydrea.

## 2015-03-06 NOTE — Progress Notes (Signed)
Subjective:    Patient ID: Jane Short, female    DOB: 06-Aug-1950, 64 y.o.   MRN: [id number]  HPI  64YO female presents for follow up.  Recently seen by Pulmonary. Started on [place]. Also seen by Cardiology. Changed from Ramipril to Losartan. Had iron infusion 9/28.  Energy level good. Started on Crestor last month. Tolerating well. No chest pain. Dyspnea much improved.  Wt Readings from Last 3 Encounters:  03/06/15 190 lb 2 oz (86.24 kg)  02/11/15 195 lb (88.451 kg)  01/29/15 195 lb (88.451 kg)   BP Readings from Last 3 Encounters:  03/06/15 116/62  02/27/15 113/61  02/20/15 124/65    Past Medical History  Diagnosis Date  . Sickle cell anemia (HCC)   . Vitamin D deficiency   . Hypertension   . Osteoporosis 2011    osteopenia  . 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 Cheree Ditto at [facility]  . Gout   . Sickle cell anemia (HCC)   . Lichen planus   . SCA-1 (spinocerebellar ataxia type 1) (HCC)    Family History  Problem Relation Age of Onset  . Heart disease Father   . Diabetes Father   . Diabetes Sister    Past Surgical History  Procedure Laterality Date  . Tubal ligation    . Gallbladder surgery     Social History   Social History  . Marital Status: Married    Spouse Name: N/A  . Number of Children: 1  . Years of Education: N/A   Occupational History  .      textiles   Social History Main Topics  . Smoking status: Current Every Day Smoker -- 0.25 packs/day for 30 years    Types: Cigarettes  . Smokeless tobacco: Never Used  . Alcohol Use: No  . Drug Use: No  . Sexual Activity: Not Asked   Other Topics Concern  . None   Social History Narrative   Regular Exercise -  NO   Daily Caffeine Use:  1 cup coffee          Review of Systems  Constitutional: Negative for fever, chills, appetite change, fatigue and unexpected weight change.  Eyes: Negative for visual disturbance.  Respiratory: Positive for cough and shortness of breath.     Cardiovascular: Negative for chest pain, palpitations and leg swelling.  Gastrointestinal: Negative for abdominal pain.  Musculoskeletal: Negative for myalgias and arthralgias.  Skin: Negative for color change and rash.  Hematological: Negative for adenopathy. Does not bruise/bleed easily.  Psychiatric/Behavioral: Negative for sleep disturbance and dysphoric mood. The patient is not nervous/anxious.        Objective:    BP 116/62 mmHg  Pulse 73  Temp(Src) 97.9 F (36.6 C) (Oral)  Ht 5\' 7"  (1.702 m)  Wt 190 lb 2 oz (86.24 kg)  BMI 29.77 kg/m2  SpO2 95% Physical Exam  Constitutional: She is oriented to person, place, and time. She appears well-developed and well-nourished. No distress.  HENT:  Head: Normocephalic and atraumatic.  Right Ear: External ear normal.  Left Ear: External ear normal.  Nose: Nose normal.  Mouth/Throat: Oropharynx is clear and moist. No oropharyngeal exudate.  Eyes: Conjunctivae are normal. Pupils are equal, round, and reactive to light. Right eye exhibits no discharge. Left eye exhibits no discharge. No scleral icterus.  Neck: Normal range of motion. Neck supple. No tracheal deviation present. No thyromegaly present.  Cardiovascular: Normal rate, regular rhythm, normal heart sounds and intact distal  pulses.  Exam reveals no gallop and no friction rub.   No murmur heard. Pulmonary/Chest: Effort normal and breath sounds normal. No respiratory distress. She has no wheezes. She has no rales. She exhibits no tenderness.  Musculoskeletal: Normal range of motion. She exhibits no edema or tenderness.  Lymphadenopathy:    She has no cervical adenopathy.  Neurological: She is alert and oriented to person, place, and time. No cranial nerve deficit. She exhibits normal muscle tone. Coordination normal.  Skin: Skin is warm and dry. No rash noted. She is not diaphoretic. No erythema. No pallor.  Psychiatric: She has a normal mood and affect. Her behavior is normal.  Judgment and thought content normal.          Assessment & Plan:   Problem List Items Addressed This Visit      Unprioritized   Atherosclerosis of aorta (HCC) (Chronic)    Repeat lipids with labs today. Continue Crestor.      Relevant Medications   losartan (COZAAR) 50 MG tablet   Other Relevant Orders   Comprehensive metabolic panel   Lipid panel   Chronic kidney disease (CKD), stage III (moderate) (Chronic)    Repeat renal function with labs today. Continue Losartan.      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 Continue to follow up with hematology for transfusion therapy. Continue Hydrea.      SOB (shortness of breath)    Symptoms have improved with Dulera. Encouraged smoking cessation. Continue to follow up with pulmonary.          Return in about 3 months (around 06/06/2015) for Recheck.

## 2015-03-06 NOTE — Assessment & Plan Note (Signed)
Repeat renal function with labs today. Continue Losartan.

## 2015-03-06 NOTE — Assessment & Plan Note (Signed)
Repeat lipids with labs today. Continue Crestor.

## 2015-03-06 NOTE — Patient Instructions (Signed)
Labs today.   Follow up in 3 months.  

## 2015-03-06 NOTE — Assessment & Plan Note (Signed)
Symptoms have improved with Dulera. Encouraged smoking cessation. Continue to follow up with pulmonary.

## 2015-03-11 LAB — HM MAMMOGRAPHY

## 2015-03-13 ENCOUNTER — Inpatient Hospital Stay: Payer: BLUE CROSS/BLUE SHIELD

## 2015-03-13 DIAGNOSIS — N189 Chronic kidney disease, unspecified: Secondary | ICD-10-CM | POA: Diagnosis not present

## 2015-03-13 DIAGNOSIS — D631 Anemia in chronic kidney disease: Secondary | ICD-10-CM

## 2015-03-13 DIAGNOSIS — D571 Sickle-cell disease without crisis: Secondary | ICD-10-CM

## 2015-03-13 LAB — HEMOGLOBIN: HEMOGLOBIN: 6.6 g/dL — AB (ref 12.0–16.0)

## 2015-03-14 LAB — SAMPLE TO BLOOD BANK

## 2015-03-20 ENCOUNTER — Inpatient Hospital Stay: Payer: BLUE CROSS/BLUE SHIELD

## 2015-03-20 DIAGNOSIS — N189 Chronic kidney disease, unspecified: Principal | ICD-10-CM

## 2015-03-20 DIAGNOSIS — D571 Sickle-cell disease without crisis: Secondary | ICD-10-CM

## 2015-03-20 DIAGNOSIS — D631 Anemia in chronic kidney disease: Secondary | ICD-10-CM

## 2015-03-20 LAB — HEMOGLOBIN: Hemoglobin: 6.3 g/dL — ABNORMAL LOW (ref 12.0–16.0)

## 2015-03-21 LAB — SAMPLE TO BLOOD BANK

## 2015-03-27 ENCOUNTER — Inpatient Hospital Stay: Payer: BLUE CROSS/BLUE SHIELD

## 2015-03-27 DIAGNOSIS — D631 Anemia in chronic kidney disease: Secondary | ICD-10-CM

## 2015-03-27 DIAGNOSIS — D571 Sickle-cell disease without crisis: Secondary | ICD-10-CM

## 2015-03-27 DIAGNOSIS — N189 Chronic kidney disease, unspecified: Secondary | ICD-10-CM | POA: Diagnosis not present

## 2015-03-27 LAB — CREATININE, SERUM
CREATININE: 2.62 mg/dL — AB (ref 0.44–1.00)
GFR, EST AFRICAN AMERICAN: 21 mL/min — AB (ref >60)
GFR, EST NON AFRICAN AMERICAN: 18 mL/min — AB (ref >60)

## 2015-03-27 LAB — IRON AND TIBC
Iron: 115 ug/dL (ref 28–170)
Saturation Ratios: 35 % — ABNORMAL HIGH (ref 10.4–31.8)
TIBC: 326 ug/dL (ref 250–450)
UIBC: 211 ug/dL

## 2015-03-27 LAB — HEPATIC FUNCTION PANEL
ALT: 17 U/L (ref 14–54)
AST: 32 U/L (ref 15–41)
Albumin: 3.4 g/dL — ABNORMAL LOW (ref 3.5–5.0)
Alkaline Phosphatase: 94 U/L (ref 38–126)
BILIRUBIN DIRECT: 4.3 mg/dL — AB (ref 0.1–0.5)
BILIRUBIN INDIRECT: 6.4 mg/dL — AB (ref 0.3–0.9)
BILIRUBIN TOTAL: 10.7 mg/dL — AB (ref 0.3–1.2)
Total Protein: 7.3 g/dL (ref 6.5–8.1)

## 2015-03-27 LAB — HEMOGLOBIN: HEMOGLOBIN: 6.1 g/dL — AB (ref 12.0–16.0)

## 2015-03-27 LAB — FERRITIN: Ferritin: 120 ng/mL (ref 11–307)

## 2015-03-28 ENCOUNTER — Encounter: Payer: Self-pay | Admitting: Internal Medicine

## 2015-04-03 ENCOUNTER — Inpatient Hospital Stay: Payer: BLUE CROSS/BLUE SHIELD

## 2015-04-03 ENCOUNTER — Encounter: Payer: Self-pay | Admitting: Family Medicine

## 2015-04-03 ENCOUNTER — Inpatient Hospital Stay (HOSPITAL_BASED_OUTPATIENT_CLINIC_OR_DEPARTMENT_OTHER): Payer: BLUE CROSS/BLUE SHIELD | Admitting: Family Medicine

## 2015-04-03 ENCOUNTER — Inpatient Hospital Stay: Payer: BLUE CROSS/BLUE SHIELD | Attending: Family Medicine

## 2015-04-03 VITALS — BP 101/62 | HR 101 | Resp 18

## 2015-04-03 VITALS — BP 113/68 | HR 106 | Temp 98.5°F | Resp 18 | Ht 67.0 in | Wt 194.7 lb

## 2015-04-03 DIAGNOSIS — D631 Anemia in chronic kidney disease: Secondary | ICD-10-CM | POA: Diagnosis not present

## 2015-04-03 DIAGNOSIS — L439 Lichen planus, unspecified: Secondary | ICD-10-CM | POA: Diagnosis not present

## 2015-04-03 DIAGNOSIS — M818 Other osteoporosis without current pathological fracture: Secondary | ICD-10-CM

## 2015-04-03 DIAGNOSIS — G111 Early-onset cerebellar ataxia: Secondary | ICD-10-CM

## 2015-04-03 DIAGNOSIS — D151 Benign neoplasm of heart: Secondary | ICD-10-CM

## 2015-04-03 DIAGNOSIS — D571 Sickle-cell disease without crisis: Secondary | ICD-10-CM | POA: Insufficient documentation

## 2015-04-03 DIAGNOSIS — N189 Chronic kidney disease, unspecified: Principal | ICD-10-CM

## 2015-04-03 DIAGNOSIS — E559 Vitamin D deficiency, unspecified: Secondary | ICD-10-CM

## 2015-04-03 DIAGNOSIS — F17210 Nicotine dependence, cigarettes, uncomplicated: Secondary | ICD-10-CM

## 2015-04-03 DIAGNOSIS — D72829 Elevated white blood cell count, unspecified: Secondary | ICD-10-CM

## 2015-04-03 DIAGNOSIS — I129 Hypertensive chronic kidney disease with stage 1 through stage 4 chronic kidney disease, or unspecified chronic kidney disease: Secondary | ICD-10-CM | POA: Diagnosis present

## 2015-04-03 DIAGNOSIS — R05 Cough: Secondary | ICD-10-CM | POA: Diagnosis not present

## 2015-04-03 DIAGNOSIS — Z79899 Other long term (current) drug therapy: Secondary | ICD-10-CM

## 2015-04-03 DIAGNOSIS — M109 Gout, unspecified: Secondary | ICD-10-CM | POA: Insufficient documentation

## 2015-04-03 DIAGNOSIS — D649 Anemia, unspecified: Secondary | ICD-10-CM

## 2015-04-03 DIAGNOSIS — R0609 Other forms of dyspnea: Secondary | ICD-10-CM | POA: Insufficient documentation

## 2015-04-03 LAB — SAMPLE TO BLOOD BANK

## 2015-04-03 LAB — HEMOGLOBIN: HEMOGLOBIN: 5.9 g/dL — AB (ref 12.0–16.0)

## 2015-04-03 MED ORDER — HEPARIN SOD (PORK) LOCK FLUSH 100 UNIT/ML IV SOLN
500.0000 [IU] | Freq: Once | INTRAVENOUS | Status: AC
  Administered 2015-04-03: 500 [IU] via INTRAVENOUS

## 2015-04-03 MED ORDER — EPOETIN ALFA 40000 UNIT/ML IJ SOLN
40000.0000 [IU] | Freq: Once | INTRAMUSCULAR | Status: DC
  Filled 2015-04-03: qty 1

## 2015-04-03 MED ORDER — SODIUM CHLORIDE 0.9 % IJ SOLN
10.0000 mL | Freq: Once | INTRAMUSCULAR | Status: AC
  Administered 2015-04-03: 10 mL via INTRAVENOUS
  Filled 2015-04-03: qty 10

## 2015-04-03 MED ORDER — HEPARIN SOD (PORK) LOCK FLUSH 100 UNIT/ML IV SOLN
500.0000 [IU] | Freq: Once | INTRAVENOUS | Status: AC
Start: 2015-04-03 — End: 2015-04-03
  Administered 2015-04-03: 500 [IU] via INTRAVENOUS
  Filled 2015-04-03: qty 10

## 2015-04-03 NOTE — Progress Notes (Signed)
[facility]  [phone]  [phone]     Jane Short DOB: 11/15/1950  MR#: [id number]  CSN#:[id number]  Patient Care Team: Jennifer A Walker, MD as PCP - General (Internal Medicine)  CHIEF COMPLAINT:  Chief Complaint  Patient presents with  . Anemia    CRF  . Injections    Procrit   1. 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 On Hydrea 500 mg daily and folic acid. Also has anemia of chronic renal insufficiency (serum creatinine by Dr. Kolluru on 08/11/12 was 1.95, with calculated creatinine clearance of 35-40 mL/min). 08/12/12 - serum EPO level was 268.6. On Procrit therapy and intermittent PRBC transfusion support.  2. Iron overload, on EXJADE 1 tablet daily.  Bone marrow biopsy 04/25/13 - No diagnostic evidence of myelodysplasia. Hypercellular marrow for age with a marked expansion of the erythroid compartment, adequate myelopoiesis and megakaryopoiesis without increased blasts (0.3%). A few small nondiagnostic lymphoid aggregates are noted. No significant increase in marrow reticulin fibers. Abundant storage iron is present. Cytogenetics - insufficient sample to perform.  INTERVAL HISTORY:  Patient is here for further follow-up and treatment consideration regarding chronic anemia with known sickle cell disease, but his transfusion dependent. She is currently on Hydrea 500 mg daily as well as folic acid. Patient also with history of anemia with chronic renal insufficiency. She overall reports feeling very well however, her hemoglobin today is 5.9. She does have chronic cough and dyspnea on exertion that are at baseline with no acute changes. She does have history of sickle cell disease and denies any recent crisis or hospitalization.  REVIEW OF SYSTEMS:   Review of Systems  Constitutional: Negative for fever, chills, weight loss, malaise/fatigue and diaphoresis.  HENT: Negative for congestion, ear discharge, ear pain,  hearing loss, nosebleeds, sore throat and tinnitus.   Eyes: Negative for blurred vision, double vision, photophobia, pain, discharge and redness.  Respiratory: Negative for cough, hemoptysis, sputum production, shortness of breath, wheezing and stridor.   Cardiovascular: Positive for leg swelling. Negative for chest pain, palpitations, orthopnea, claudication and PND.  Gastrointestinal: Negative for heartburn, nausea, vomiting, abdominal pain, diarrhea, constipation, blood in stool and melena.  Genitourinary: Negative.   Musculoskeletal: Negative.   Skin: Negative.        Patient with history of lichen planus, has open wound on the left shin  Neurological: Negative for dizziness, tingling, focal weakness, seizures, weakness and headaches.  Endo/Heme/Allergies: Does not bruise/bleed easily.  Psychiatric/Behavioral: Negative for depression. The patient is not nervous/anxious and does not have insomnia.     As per HPI. Otherwise, a complete review of systems is negatve.   PAST MEDICAL HISTORY: Past Medical History  Diagnosis Date  . Sickle cell anemia (HCC)   . Vitamin D deficiency   . Hypertension   . Osteoporosis 2011    osteopenia  . 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 Gaca at [facility]  . Gout   . Sickle cell anemia (HCC)   . Lichen planus   . SCA-1 (spinocerebellar ataxia type 1) (HCC)     PAST SURGICAL HISTORY: Past Surgical History  Procedure Laterality Date  . Tubal ligation    . Gallbladder surgery      FAMILY HISTORY Family History  Problem Relation Age of Onset  . Heart disease Father   . Diabetes Father   . Diabetes Sister     GYNECOLOGIC HISTORY:  No LMP recorded. Patient is postmenopausal.       ADVANCED DIRECTIVES:    HEALTH MAINTENANCE: Social History  Substance Use Topics  . Smoking status: Current Every Day Smoker -- 0.25 packs/day for 30 years    Types: Cigarettes  . Smokeless tobacco: Never Used  . Alcohol Use: No      Colonoscopy:  PAP:  Bone density:  Lipid panel:  Allergies  Allergen Reactions  . Ramipril     cough    Current Outpatient Prescriptions  Medication Sig Dispense Refill  . albuterol (PROVENTIL HFA;VENTOLIN HFA) 108 (90 BASE) MCG/ACT inhaler Inhale 2 puffs into the lungs every 6 (six) hours as needed. 18 g 6  . allopurinol (ZYLOPRIM) 100 MG tablet Take 100 mg by mouth daily.     . calcitRIOL (ROCALTROL) 0.25 MCG capsule Take 0.25 mcg by mouth daily.    . folic acid (FOLVITE) 1 MG tablet Take 1 mg by mouth daily.    . furosemide (LASIX) 20 MG tablet Take 60 mg by mouth daily.     . hydroxyurea (HYDREA) 500 MG capsule Take 500 mg by mouth daily. May take with food to minimize GI side effects.    . losartan (COZAAR) 50 MG tablet Take 50 mg by mouth daily.    . mometasone-formoterol (DULERA) 200-5 MCG/ACT AERO Inhale 2 puffs into the lungs 2 (two) times daily. 1 Inhaler 12  . rosuvastatin (CRESTOR) 5 MG tablet Take 1 tablet (5 mg total) by mouth daily. 90 tablet 3   No current facility-administered medications for this visit.   Facility-Administered Medications Ordered in Other Visits  Medication Dose Route Frequency Provider Last Rate Last Dose  . heparin lock flush 100 unit/mL  250 Units Intracatheter PRN Sandeep Pandit, MD      . heparin lock flush 100 unit/mL  500 Units Intravenous Once Sandeep Pandit, MD      . sodium chloride 0.9 % injection 10 mL  10 mL Intravenous PRN Sandeep Pandit, MD   10 mL at 02/20/15 1530  . sodium chloride 0.9 % injection 10 mL  10 mL Intravenous PRN Sandeep Pandit, MD   10 mL at 02/20/15 1530  . sodium chloride 0.9 % injection 10 mL  10 mL Intravenous PRN Sandeep Pandit, MD   10 mL at 02/27/15 1410  . sodium chloride 0.9 % injection 3 mL  3 mL Intracatheter PRN Sandeep Pandit, MD   3 mL at 10/05/14 0851    OBJECTIVE: BP 113/68 mmHg  Pulse 106  Temp(Src) 98.5 F (36.9 C) (Tympanic)  Resp 18  Ht 5' 7" (1.702 m)  Wt 194 lb 10.7 oz (88.3 kg)   BMI 30.48 kg/m2   Body mass index is 30.48 kg/(m^2).    ECOG FS:1 - Symptomatic but completely ambulatory  General: Well-developed, well-nourished, no acute distress. Eyes: Pink conjunctiva, Icteric sclera. HEENT: Normocephalic, moist mucous membranes, clear oropharnyx. Lungs: Clear to auscultation bilaterally. Heart: Regular rate and rhythm. No rubs, murmurs, or gallops. Abdomen: Soft, nontender, nondistended. No organomegaly noted, normoactive bowel sounds. Musculoskeletal: Bilateral lower extremity edema, previously evaluated negative for DVT, no cyanosis, or clubbing. Neuro: Alert, answering all questions appropriately. Cranial nerves grossly intact. Skin: Patient appears jaundice. Patient has reported history of lichen planus over bilateral lower extremities. Left lower extremity with open wound that is draining serous fluid. Psych: Normal affect.    LAB RESULTS:  Infusion on 04/03/2015  Component Date Value Ref Range Status  . Blood Bank Specimen 04/03/2015 SAMPLE AVAILABLE FOR TESTING   Final  . Sample Expiration 04/03/2015 04/06/2015     Final  . Hemoglobin 04/03/2015 5.9* 12.0 - 16.0 g/dL Final   A-LINE DRAW    STUDIES: No results found.  ASSESSMENT: Chronic anemia, related to sickle cell disease as well as anemia of chronic renal insufficiency.  PLAN:   1. Chronic anemia, sickle cell disease, on hydroxyurea 500 mg daily. Also has anemia of chronic renal insufficiency (serum creatinine by Dr. Juleen China on 08/11/2012 was 1.95, with calculated creatinine clearance of 35-40 mL/min). On Procrit 40,000 units weekly and on folic acid 1 mg daily.  Bone marrow biopsy done on 04/25/13 is negative for underlying marrow disorder like myelodysplasia. Hemoglobin continues to remain low but fluctuates within a steady range of 5.3 to 6.9, occasionally it goes > 7g after transfusion support. We will continue to monitor hemoglobin weekly and give Procrit 40000 units subcu injection if  hemoglobin is 10 or less. With hemoglobin today of 5.9 patient will receive 40,000 units of Procrit as well as 1 unit of packed red blood cells scheduled for Friday, 04/05/2015. 2. 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 Continue to monitor intermittently. 3. Chronic leukocytosis. BCR-ABL is negative. Peripheral blood immunophenotyping also unremarkable. Most likely has reactive leukocytosis secondary to smoking. WBC count is normal today. Continue to monitor intermittently. 4. Iron overload. Iron studies performed one week ago on October 26. Ferritin 120, iron 115, TIBC 326, saturation ratios of 35%. Continue Exjade 500 mg once daily.   Patient expressed understanding and was in agreement with this plan. She also understands that She can call clinic at any time with any questions, concerns, or complaints.   Dr. Oliva Bustard was available for consultation and review of plan of care for this patient.  Evlyn Kanner, NP   04/03/2015 3:32 PM

## 2015-04-03 NOTE — Progress Notes (Signed)
Patient is here for follow-up of anemia and procrit injection. Patient states that overall she has been feeling good.

## 2015-04-04 ENCOUNTER — Inpatient Hospital Stay: Payer: BLUE CROSS/BLUE SHIELD

## 2015-04-04 ENCOUNTER — Telehealth: Payer: Self-pay | Admitting: *Deleted

## 2015-04-04 ENCOUNTER — Other Ambulatory Visit: Payer: Self-pay | Admitting: Family Medicine

## 2015-04-04 ENCOUNTER — Other Ambulatory Visit: Payer: BLUE CROSS/BLUE SHIELD | Admitting: *Deleted

## 2015-04-04 DIAGNOSIS — S81802A Unspecified open wound, left lower leg, initial encounter: Secondary | ICD-10-CM

## 2015-04-04 DIAGNOSIS — D631 Anemia in chronic kidney disease: Secondary | ICD-10-CM

## 2015-04-04 DIAGNOSIS — I129 Hypertensive chronic kidney disease with stage 1 through stage 4 chronic kidney disease, or unspecified chronic kidney disease: Secondary | ICD-10-CM | POA: Diagnosis not present

## 2015-04-04 DIAGNOSIS — N189 Chronic kidney disease, unspecified: Principal | ICD-10-CM

## 2015-04-04 MED ORDER — SILVER SULFADIAZINE 1 % EX CREA: 1.0000 "application " | TOPICAL_CREAM | Freq: Two times a day (BID) | CUTANEOUS | Status: DC

## 2015-04-04 MED ORDER — EPOETIN ALFA 40000 UNIT/ML IJ SOLN
40000.0000 [IU] | Freq: Once | INTRAMUSCULAR | Status: AC
  Administered 2015-04-04: 40000 [IU] via SUBCUTANEOUS

## 2015-04-04 NOTE — Telephone Encounter (Signed)
Called to say she was told Rx would be sent to pharmacy, but they have not received anything from [place]

## 2015-04-04 NOTE — Telephone Encounter (Signed)
Silvadene Cream was to be sent, L Herring, AGNP-C escribing. Pt informed

## 2015-04-05 ENCOUNTER — Inpatient Hospital Stay: Payer: BLUE CROSS/BLUE SHIELD

## 2015-04-05 ENCOUNTER — Other Ambulatory Visit: Payer: Self-pay | Admitting: Family Medicine

## 2015-04-05 VITALS — BP 124/68 | HR 109 | Temp 98.5°F | Resp 20

## 2015-04-05 DIAGNOSIS — I129 Hypertensive chronic kidney disease with stage 1 through stage 4 chronic kidney disease, or unspecified chronic kidney disease: Secondary | ICD-10-CM | POA: Diagnosis not present

## 2015-04-05 DIAGNOSIS — N189 Chronic kidney disease, unspecified: Principal | ICD-10-CM

## 2015-04-05 DIAGNOSIS — D631 Anemia in chronic kidney disease: Secondary | ICD-10-CM

## 2015-04-05 DIAGNOSIS — D571 Sickle-cell disease without crisis: Secondary | ICD-10-CM

## 2015-04-05 LAB — PREPARE RBC (CROSSMATCH)

## 2015-04-05 MED ORDER — DIPHENHYDRAMINE HCL 25 MG PO CAPS
25.0000 mg | ORAL_CAPSULE | Freq: Once | ORAL | Status: AC
  Administered 2015-04-05: 25 mg via ORAL
  Filled 2015-04-05: qty 1

## 2015-04-05 MED ORDER — HEPARIN SOD (PORK) LOCK FLUSH 100 UNIT/ML IV SOLN
500.0000 [IU] | Freq: Once | INTRAVENOUS | Status: AC
  Administered 2015-04-05: 500 [IU] via INTRAVENOUS

## 2015-04-05 MED ORDER — HEPARIN SOD (PORK) LOCK FLUSH 100 UNIT/ML IV SOLN
INTRAVENOUS | Status: AC
  Filled 2015-04-05: qty 10

## 2015-04-05 MED ORDER — SODIUM CHLORIDE 0.9 % IJ SOLN
10.0000 mL | Freq: Once | INTRAMUSCULAR | Status: AC
  Administered 2015-04-05: 10 mL via INTRAVENOUS
  Filled 2015-04-05: qty 10

## 2015-04-05 MED ORDER — SODIUM CHLORIDE 0.9 % IV SOLN
250.0000 mL | Freq: Once | INTRAVENOUS | Status: AC
  Administered 2015-04-05: 250 mL via INTRAVENOUS
  Filled 2015-04-05: qty 250

## 2015-04-05 MED ORDER — ACETAMINOPHEN 325 MG PO TABS
650.0000 mg | ORAL_TABLET | Freq: Once | ORAL | Status: AC
  Administered 2015-04-05: 650 mg via ORAL
  Filled 2015-04-05: qty 2

## 2015-04-05 MED ORDER — SODIUM CHLORIDE 0.9 % IJ SOLN
10.0000 mL | INTRAMUSCULAR | Status: AC | PRN
  Administered 2015-04-05: 10 mL
  Filled 2015-04-05: qty 10

## 2015-04-05 MED ORDER — SODIUM CHLORIDE 0.9 % IV SOLN
15.0000 mg/kg/h | Freq: Once | INTRAVENOUS | Status: AC
  Administered 2015-04-05: 15 mg/kg/h via INTRAVENOUS
  Filled 2015-04-05: qty 2

## 2015-04-07 ENCOUNTER — Other Ambulatory Visit: Payer: Self-pay | Admitting: *Deleted

## 2015-04-08 ENCOUNTER — Telehealth: Payer: Self-pay

## 2015-04-08 NOTE — Telephone Encounter (Signed)
Pt called stating she has a cough and can feel a knot in her chest currently is not Short of Breath. She was supposed to follow up with her Pulmonology but office schedule it booked up. Advised patient she needs to come in for appointment She is scheduled for Thursday 04/11/2015.

## 2015-04-08 NOTE — Telephone Encounter (Signed)
Pt was seen a couple days ago with coughing. She was placed on

## 2015-04-10 ENCOUNTER — Inpatient Hospital Stay: Payer: BLUE CROSS/BLUE SHIELD

## 2015-04-10 VITALS — BP 113/64 | HR 94 | Temp 98.0°F | Resp 20

## 2015-04-10 DIAGNOSIS — I129 Hypertensive chronic kidney disease with stage 1 through stage 4 chronic kidney disease, or unspecified chronic kidney disease: Secondary | ICD-10-CM | POA: Diagnosis not present

## 2015-04-10 DIAGNOSIS — N189 Chronic kidney disease, unspecified: Principal | ICD-10-CM

## 2015-04-10 DIAGNOSIS — D631 Anemia in chronic kidney disease: Secondary | ICD-10-CM

## 2015-04-10 DIAGNOSIS — D571 Sickle-cell disease without crisis: Secondary | ICD-10-CM

## 2015-04-10 LAB — SAMPLE TO BLOOD BANK

## 2015-04-10 LAB — HEMOGLOBIN: HEMOGLOBIN: 7 g/dL — AB (ref 12.0–16.0)

## 2015-04-10 MED ORDER — EPOETIN ALFA 40000 UNIT/ML IJ SOLN
40000.0000 [IU] | Freq: Once | INTRAMUSCULAR | Status: AC
  Administered 2015-04-10: 40000 [IU] via SUBCUTANEOUS
  Filled 2015-04-10: qty 1

## 2015-04-11 ENCOUNTER — Encounter: Payer: Self-pay | Admitting: Nurse Practitioner

## 2015-04-11 ENCOUNTER — Ambulatory Visit (INDEPENDENT_AMBULATORY_CARE_PROVIDER_SITE_OTHER): Payer: BLUE CROSS/BLUE SHIELD | Admitting: Nurse Practitioner

## 2015-04-11 VITALS — BP 108/56 | HR 71 | Temp 98.0°F | Wt 199.6 lb

## 2015-04-11 DIAGNOSIS — R0602 Shortness of breath: Secondary | ICD-10-CM | POA: Diagnosis not present

## 2015-04-11 DIAGNOSIS — M545 Low back pain, unspecified: Secondary | ICD-10-CM

## 2015-04-11 DIAGNOSIS — K439 Ventral hernia without obstruction or gangrene: Secondary | ICD-10-CM

## 2015-04-11 MED ORDER — CYCLOBENZAPRINE HCL 5 MG PO TABS: 5.0000 mg | ORAL_TABLET | Freq: Every day | ORAL | Status: DC

## 2015-04-11 NOTE — Progress Notes (Signed)
Patient ID: Jane Short, female    DOB: 02-Mar-1951  Age: 64 y.o. MRN: [id number]  CC: Knot in chest   HPI Jane Short presents for CC of knot at chest area, cough, and back pain.   1) Back pain with lying down - lower back, "locks up" when lying down Feels normal with movement   2) 2 weeks or less of spot on abdomen that pops up when sitting up and decreases when lying down, intermittently slightly sore especially with touching it.   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   She has past surgical history that includes Tubal ligation and Gallbladder surgery.   Her family history includes Diabetes in her father and sister; Heart disease in her father.She reports that she has been smoking Cigarettes.  She has a 7.5 pack-year smoking history. She has never used smokeless tobacco. She reports that she does not drink alcohol or use illicit drugs.  Outpatient Prescriptions Prior to Visit  Medication Sig Dispense Refill  . albuterol (PROVENTIL HFA;VENTOLIN HFA) 108 (90 BASE) MCG/ACT inhaler Inhale 2 puffs into the lungs every 6 (six) hours as needed. 18 g 6  . allopurinol (ZYLOPRIM) 100 MG tablet Take 100 mg by mouth daily.     . calcitRIOL (ROCALTROL) 0.25 MCG capsule Take 0.25 mcg by mouth daily.    . folic acid (FOLVITE) 1 MG tablet Take 1 mg by mouth daily.    . furosemide (LASIX) 20 MG tablet Take 60 mg by mouth daily.     . hydroxyurea (HYDREA) 500 MG capsule Take 500 mg by mouth daily. May take with food to minimize GI side effects.    Marland Kitchen losartan (COZAAR) 50 MG tablet Take 50 mg by mouth daily.    . mometasone-formoterol (DULERA) 200-5 MCG/ACT AERO  Inhale 2 puffs into the lungs 2 (two) times daily. 1 Inhaler 12  . rosuvastatin (CRESTOR) 5 MG tablet Take 1 tablet (5 mg total) by mouth daily. 90 tablet 3  . silver sulfADIAZINE (SILVADENE) 1 % cream Apply 1 application topically 2 (two) times daily. 50 g 0   Facility-Administered Medications Prior to Visit  Medication Dose Route Frequency Provider Last Rate Last Dose  . heparin lock flush 100 unit/mL  250 Units Intracatheter PRN Jane Alf, MD      . heparin lock flush 100 unit/mL  500 Units Intravenous Once Jane Alf, MD      . sodium chloride 0.9 % injection 10 mL  10 mL Intravenous PRN Jane Alf, MD   10 mL at 02/20/15 1530  . sodium chloride 0.9 % injection 10 mL  10 mL Intravenous PRN Jane Alf, MD   10 mL at 02/20/15 1530  . sodium chloride 0.9 % injection 10 mL  10 mL Intravenous PRN Jane Alf, MD   10 mL at 02/27/15 1410  . sodium chloride 0.9 % injection 3 mL  3 mL Intracatheter PRN Jane Alf, MD   3 mL at 10/05/14 0851    ROS Review of Systems  Constitutional: Negative for fever, chills, diaphoresis and fatigue.  Respiratory: Positive for cough. Negative for chest tightness, shortness of breath and wheezing.   Cardiovascular: Negative for chest  pain, palpitations and leg swelling.  Gastrointestinal: Positive for abdominal pain and abdominal distention. Negative for nausea, vomiting and diarrhea.       Mild soreness at sight  Musculoskeletal: Positive for myalgias.  Skin: Negative for rash.  Neurological: Negative for dizziness, weakness, numbness and headaches.  Psychiatric/Behavioral: The patient is not nervous/anxious.     Objective:  BP 108/56 mmHg  Pulse 71  Temp(Src) 98 F (36.7 C) (Oral)  Wt 199 lb 9.6 oz (90.538 kg)  SpO2 94%  Physical Exam  Constitutional: She is oriented to person, place, and time. She appears well-developed and well-nourished. No distress.  HENT:  Head: Normocephalic and atraumatic.  Right Ear: External ear  normal.  Left Ear: External ear normal.  TM's clear bilaterally minor cerumen  Eyes: EOM are normal. Pupils are equal, round, and reactive to light. Right eye exhibits no discharge. Left eye exhibits no discharge. No scleral icterus.  Cardiovascular: Normal rate, regular rhythm and normal heart sounds.  Exam reveals no gallop and no friction rub.   No murmur heard. Pulmonary/Chest: Effort normal. No respiratory distress. She has wheezes. She has no rales. She exhibits no tenderness.  Upper lobes expiratory wheezes  Abdominal: Soft. Bowel sounds are normal. She exhibits no distension and no mass. There is no tenderness. There is no rebound and no guarding.  Small epigastric reducible hernia  Neurological: She is alert and oriented to person, place, and time. No cranial nerve deficit. She exhibits normal muscle tone. Coordination normal.  Skin: Skin is warm and dry. No rash noted. She is not diaphoretic.  Psychiatric: She has a normal mood and affect. Her behavior is normal. Judgment and thought content normal.   Assessment & Plan:   Jane Short was seen today for knot in chest.  Diagnoses and all orders for this visit:  Ventral hernia without obstruction or gangrene -     Ambulatory referral to General Surgery  Bilateral low back pain without sciatica  Other orders -     cyclobenzaprine (FLEXERIL) 5 MG tablet; Take 1 tablet (5 mg total) by mouth at bedtime.   I am having Jane Short start on cyclobenzaprine. I am also having her maintain her folic acid, albuterol, allopurinol, hydroxyurea, calcitRIOL, furosemide, rosuvastatin, mometasone-formoterol, losartan, and silver sulfADIAZINE.  Meds ordered this encounter  Medications  . cyclobenzaprine (FLEXERIL) 5 MG tablet    Sig: Take 1 tablet (5 mg total) by mouth at bedtime.    Dispense:  30 tablet    Refill:  1    Order Specific Question:  Supervising Provider    Answer:  Crecencio Mc [[id number]]     Follow-up: Return in about 4 weeks  (around 05/09/2015) for With Dr. Gilford Rile for follow up .

## 2015-04-11 NOTE — Patient Instructions (Signed)
We will contact you about general surgery consultation.   Use your inhaler and let us know if it worsens!

## 2015-04-15 ENCOUNTER — Encounter: Payer: BLUE CROSS/BLUE SHIELD | Attending: Surgery | Admitting: Surgery

## 2015-04-15 DIAGNOSIS — M109 Gout, unspecified: Secondary | ICD-10-CM | POA: Diagnosis not present

## 2015-04-15 DIAGNOSIS — J449 Chronic obstructive pulmonary disease, unspecified: Secondary | ICD-10-CM | POA: Diagnosis not present

## 2015-04-15 DIAGNOSIS — I129 Hypertensive chronic kidney disease with stage 1 through stage 4 chronic kidney disease, or unspecified chronic kidney disease: Secondary | ICD-10-CM | POA: Diagnosis not present

## 2015-04-15 DIAGNOSIS — M810 Age-related osteoporosis without current pathological fracture: Secondary | ICD-10-CM | POA: Diagnosis not present

## 2015-04-15 DIAGNOSIS — D151 Benign neoplasm of heart: Secondary | ICD-10-CM | POA: Diagnosis not present

## 2015-04-15 DIAGNOSIS — Z992 Dependence on renal dialysis: Secondary | ICD-10-CM | POA: Diagnosis not present

## 2015-04-15 DIAGNOSIS — D571 Sickle-cell disease without crisis: Secondary | ICD-10-CM | POA: Diagnosis not present

## 2015-04-15 DIAGNOSIS — L439 Lichen planus, unspecified: Secondary | ICD-10-CM | POA: Diagnosis not present

## 2015-04-15 DIAGNOSIS — N189 Chronic kidney disease, unspecified: Secondary | ICD-10-CM | POA: Diagnosis not present

## 2015-04-15 DIAGNOSIS — L97222 Non-pressure chronic ulcer of left calf with fat layer exposed: Secondary | ICD-10-CM | POA: Insufficient documentation

## 2015-04-15 DIAGNOSIS — E559 Vitamin D deficiency, unspecified: Secondary | ICD-10-CM | POA: Diagnosis not present

## 2015-04-15 DIAGNOSIS — Z79899 Other long term (current) drug therapy: Secondary | ICD-10-CM | POA: Insufficient documentation

## 2015-04-15 DIAGNOSIS — I890 Lymphedema, not elsewhere classified: Secondary | ICD-10-CM | POA: Diagnosis not present

## 2015-04-15 DIAGNOSIS — F17218 Nicotine dependence, cigarettes, with other nicotine-induced disorders: Secondary | ICD-10-CM | POA: Diagnosis not present

## 2015-04-16 ENCOUNTER — Encounter: Payer: Self-pay | Admitting: *Deleted

## 2015-04-16 NOTE — Progress Notes (Signed)
Jane, Short ([id number]) Visit Report for 04/15/2015 Abuse/Suicide Risk Screen Details Patient Name: Jane Short, Jane Short 04/15/2015 10:15 Date of Service: AM Medical Record [id number] Number: Patient Account Number: [id number] Feb 22, 1951 (64 y.o. Treating RN: Montey Hora Date of Birth/Sex: Female) Other Clinician: Primary Care Physician: Ronette Deter Treating Britto, Errol Referring Physician: Nolon Stalls Physician/Extender: Weeks in Treatment: 0 Abuse/Suicide Risk Screen Items Answer ABUSE/SUICIDE RISK SCREEN: Has anyone close to you tried to hurt or harm you recentlyo No Do you feel uncomfortable with anyone in your familyo No Has anyone forced you do things that you didnot want to doo No Do you have any thoughts of harming yourselfo No Patient displays signs or symptoms of abuse and/or neglect. No Electronic Signature(s) Signed: 04/15/2015 5:00:47 PM By: Montey Hora Entered By: Montey Hora on 04/15/2015 10:32:39 Jane Short ([id number]) -------------------------------------------------------------------------------- Activities of Daily Living Details Patient Name: Jane, Short 04/15/2015 10:15 Date of Service: AM Medical Record [id number] Number: Patient Account Number: [id number] 02-27-1951 (64 y.o. Treating RN: Montey Hora Date of Birth/Sex: Female) Other Clinician: Primary Care Physician: Ronette Deter Treating Christin Fudge Referring Physician: Nolon Stalls Physician/Extender: Suella Grove in Treatment: 0 Activities of Daily Living Items Answer Activities of Daily Living (Please select one for each item) Drive Automobile Completely Able Take Medications Completely Able Use Telephone Completely Able Care for Appearance Completely Able Use Toilet Completely Able Bath / Shower Completely Able Dress Self Completely Able Feed Self Completely Able Walk Completely Able Get In / Out Bed Completely Able Housework Completely  Able Prepare Meals Completely [place] Completely Able Shop for Self Completely Able Electronic Signature(s) Signed: 04/15/2015 5:00:47 PM By: Montey Hora Entered By: Montey Hora on 04/15/2015 10:32:59 Jane Short ([id number]) -------------------------------------------------------------------------------- Education Assessment Details Patient Name: Jane, Short 04/15/2015 10:15 Date of Service: AM Medical Record [id number] Number: Patient Account Number: [id number] 06-18-1950 (64 y.o. Treating RN: Montey Hora Date of Birth/Sex: Female) Other Clinician: Primary Care Physician: Ronette Deter Treating Christin Fudge Referring Physician: Nolon Stalls Physician/Extender: Suella [place] in Treatment: 0 Primary Learner Assessed: Patient Learning Preferences/Education Level/Primary Language Learning Preference: Explanation, Demonstration Highest Education Level: High School Preferred Language: English Cognitive Barrier Assessment/Beliefs Language Barrier: No Translator Needed: No Memory Deficit: No Emotional Barrier: No Cultural/Religious Beliefs Affecting Medical No Care: Physical Barrier Assessment Impaired Vision: No Impaired Hearing: No Decreased Hand dexterity: No Knowledge/Comprehension Assessment Knowledge Level: Medium Comprehension Level: Medium Ability to understand written Medium instructions: Ability to understand verbal Medium instructions: Motivation Assessment Anxiety Level: Calm Cooperation: Cooperative Education Importance: Acknowledges Need Interest in Health Problems: Asks Questions Perception: Coherent Willingness to Engage in Self- Medium Management Activities: Readiness to Engage in Self- Medium Management Activities: BRITLYN, MCNAMER ([id number]) Electronic Signature(s) Signed: 04/15/2015 5:00:47 PM By: Montey Hora Entered By: Montey Hora on 04/15/2015 10:33:30 Jane Short  ([id number]) -------------------------------------------------------------------------------- Fall Risk Assessment Details Patient Name: Jane, Short 04/15/2015 10:15 Date of Service: AM Medical Record [id number] Number: Patient Account Number: [id number] March 25, 1951 (65 y.o. Treating RN: Montey Hora Date of Birth/Sex: Female) Other Clinician: Primary Care Physician: Ronette Deter Treating Christin Fudge Referring Physician: Nolon Stalls Physician/Extender: Suella [place] in Treatment: 0 Fall Risk Assessment Items FALL RISK ASSESSMENT: History of falling - immediate or within 3 months 0 No Secondary diagnosis 0 No Ambulatory aid None/bed rest/wheelchair/nurse 0 Yes Crutches/cane/walker 0 No Furniture 0 No IV Access/Saline Lock 0 No Gait/Training Normal/bed rest/immobile 0 Yes Weak 10 Yes Impaired 0 No Mental Status Oriented to own ability 0 Yes Electronic Signature(s) Signed: 04/15/2015 5:00:47  PM By: Montey Hora Entered By: Montey Hora on 04/15/2015 10:33:59 Jane Short ([id number]) -------------------------------------------------------------------------------- Foot Assessment Details Patient Name: HYLA, HAVEN 04/15/2015 10:15 Date of Service: AM Medical Record [id number] Number: Patient Account Number: [id number] 06-11-1950 (64 y.o. Treating RN: Montey Hora Date of Birth/Sex: Female) Other Clinician: Primary Care Physician: Ronette Deter Treating Britto, Errol Referring Physician: Nolon Stalls Physician/Extender: Suella [place] in Treatment: 0 Foot Assessment Items Site Locations + = Sensation present, - = Sensation absent, C = Callus, U = Ulcer R = Redness, W = Warmth, M = Maceration, PU = Pre-ulcerative lesion F = Fissure, S = Swelling, D = Dryness Assessment Right: Left: Other Deformity: No No Prior Foot Ulcer: No No Prior Amputation: No No Charcot Joint: No No Ambulatory Status: Ambulatory Without Help Gait: Steady Electronic  Signature(s) Signed: 04/15/2015 5:00:47 PM By: Montey Hora Entered By: Montey Hora on 04/15/2015 10:39:46 Jane Short ([id number]) -------------------------------------------------------------------------------- Nutrition Risk Assessment Details Patient Name: Jane Short 04/15/2015 10:15 Date of Service: AM Medical Record [id number] Number: Patient Account Number: [id number] 1951/01/25 (63 y.o. Treating RN: Montey Hora Date of Birth/Sex: Female) Other Clinician: Primary Care Physician: Ronette Deter Treating Christin Fudge Referring Physician: Nolon Stalls Physician/Extender: Suella [place] in Treatment: 0 Height (in): 66 Weight (lbs): 198 Body Mass Index (BMI): 32 Nutrition Risk Assessment Items NUTRITION RISK SCREEN: I have an illness or condition that made me change the kind and/or 0 No amount of food I eat I eat fewer than two meals per day 0 No I eat few fruits and vegetables, or milk products 0 No I have three or more drinks of beer, liquor or wine almost every day 0 No I have tooth or mouth problems that make it hard for me to eat 0 No I don't always have enough money to buy the food I need 0 No I eat alone most of the time 0 No I take three or more different prescribed or over-the-counter drugs a 1 Yes day Without wanting to, I have lost or gained 10 pounds in the last six 0 No months I am not always physically able to shop, cook and/or feed myself 0 No Nutrition Protocols Good Risk Protocol 0 No interventions needed Moderate Risk Protocol Electronic Signature(s) Signed: 04/15/2015 5:00:47 PM By: Montey Hora Entered By: Montey Hora on 04/15/2015 10:34:06

## 2015-04-16 NOTE — Progress Notes (Signed)
CARRIN, BARTZ ([id number]) Visit Report for 04/15/2015 Chief Complaint Document Details Patient Name: Jane Short, Jane Short 04/15/2015 10:15 Date of Service: AM Medical Record [id number] Number: Patient Account Number: [id number] April 25, 1951 (64 y.o. Treating RN: Montey Hora Date of Birth/Sex: Female) Other Clinician: Primary Care Physician: Ronette Deter Treating Christin Fudge Referring Physician: Nolon Stalls Physician/Extender: Suella Grove in Treatment: 0 Information Obtained from: Patient Chief Complaint Patient presents to the wound care center for a consult due non healing wound left lower extremity with draining fluid for about 2 months Electronic Signature(s) Signed: 04/15/2015 11:18:41 AM By: Christin Fudge MD, FACS Entered By: Christin Fudge on 04/15/2015 11:18:40 Jane Short ([id number]) -------------------------------------------------------------------------------- Debridement Details Patient Name: Jane Short 04/15/2015 10:15 Date of Service: AM Medical Record [id number] Number: Patient Account Number: [id number] August 24, 1950 (64 y.o. Treating RN: Montey Hora Date of Birth/Sex: Female) Other Clinician: Primary Care Physician: Ronette Deter Treating Kasyn Rolph Referring Physician: Nolon Stalls Physician/Extender: Suella Grove in Treatment: 0 Debridement Performed for Wound #1 Left,Proximal,Anterior Lower Leg Assessment: Performed By: Physician Christin Fudge, MD Debridement: Debridement Pre-procedure Yes Verification/Time Out Taken: Start Time: 11:02 Pain Control: Lidocaine 4% Topical Solution Level: Skin/Subcutaneous Tissue Total Area Debrided (L x 1 (cm) x 1.3 (cm) = 1.3 (cm) W): Tissue and other Viable, Non-Viable, Eschar, Fibrin/Slough, Subcutaneous material debrided: Instrument: Curette Bleeding: None End Time: 11:03 Procedural Pain: 0 Post Procedural Pain: 0 Response to Treatment: Procedure was tolerated well Post  Debridement Measurements of Total Wound Length: (cm) 1 Width: (cm) 1.3 Depth: (cm) 0.2 Volume: (cm) 0.204 Post Procedure Diagnosis Same as Pre-procedure Electronic Signature(s) Signed: 04/15/2015 11:17:57 AM By: Christin Fudge MD, FACS Signed: 04/15/2015 5:00:47 PM By: Montey Hora Entered By: Christin Fudge on 04/15/2015 11:17:56 Jane Short ([id number]) -------------------------------------------------------------------------------- Debridement Details Patient Name: Jane Short, Jane Short 04/15/2015 10:15 Date of Service: AM Medical Record [id number] Number: Patient Account Number: [id number] 05/29/1951 (64 y.o. Treating RN: Montey Hora Date of Birth/Sex: Female) Other Clinician: Primary Care Physician: Ronette Deter Treating Kaveh Kissinger Referring Physician: Nolon Stalls Physician/Extender: Suella Grove in Treatment: 0 Debridement Performed for Wound #2 Left,Distal,Anterior Lower Leg Assessment: Performed By: Physician Christin Fudge, MD Debridement: Debridement Pre-procedure Yes Verification/Time Out Taken: Start Time: 11:03 Pain Control: Lidocaine 4% Topical Solution Level: Skin/Subcutaneous Tissue Total Area Debrided (L x 0.8 (cm) x 0.3 (cm) = 0.24 (cm) W): Tissue and other Viable, Non-Viable, Eschar, Fibrin/Slough, Subcutaneous material debrided: Instrument: Curette Bleeding: None End Time: 11:04 Procedural Pain: 0 Post Procedural Pain: 0 Response to Treatment: Procedure was tolerated well Post Debridement Measurements of Total Wound Length: (cm) 0.8 Width: (cm) 0.3 Depth: (cm) 0.1 Volume: (cm) 0.019 Post Procedure Diagnosis Same as Pre-procedure Electronic Signature(s) Signed: 04/15/2015 11:18:06 AM By: Christin Fudge MD, FACS Signed: 04/15/2015 5:00:47 PM By: Montey Hora Entered By: Christin Fudge on 04/15/2015 11:18:06 Jane Short ([id number]) -------------------------------------------------------------------------------- HPI  Details Patient Name: Jane Short, Jane Short 04/15/2015 10:15 Date of Service: AM Medical Record [id number] Number: Patient Account Number: [id number] 08-04-1950 (64 y.o. Treating RN: Montey Hora Date of Birth/Sex: Female) Other Clinician: Primary Care Physician: Ronette Deter Treating Christin Fudge Referring Physician: Nolon Stalls Physician/Extender: Weeks in Treatment: 0 History of Present Illness Location: has had 2 open wounds on the left lower extremity with swelling of her legs Quality: Patient reports experiencing a dull pain to affected area(s). Severity: Patient states wound are getting worse. Duration: Patient has had the wound for > 2 months prior to seeking treatment at the wound center Timing: Pain in wound is constant (hurts all the time)  Context: The wound appeared gradually over time. 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 Modifying Factors: Other treatment(s) tried include:Silvadene ointment Associated Signs and Symptoms: Patient reports having increase swelling. 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 Past medical history significant for sickle cell anemia, vitamin D deficiency, hypertension, osteoporosis, atrial myxoma, gout, lichen planus and status post tubal ligation and gallbladder surgery. She is a smoker and smokes about 10 cigarettes a day. Electronic Signature(s) Signed: 04/15/2015 11:20:00 AM By: Christin Fudge MD, FACS Previous Signature: 04/15/2015 10:51:50 AM Version By: Christin Fudge MD, FACS Previous Signature: 04/15/2015 10:46:08 AM Version By: Christin Fudge MD, FACS Entered By: Christin Fudge on 04/15/2015 11:20:00 Jane Short  ([id number]) -------------------------------------------------------------------------------- Physical Exam Details Patient Name: Jane Short, Jane Short 04/15/2015 10:15 Date of Service: AM Medical Record [id number] Number: Patient Account Number: [id number] January 06, 1951 (64 y.o. Treating RN: Montey Hora Date of Birth/Sex: Female) Other Clinician: Primary Care Physician: Ronette Deter Treating Christin Fudge Referring Physician: Nolon Stalls Physician/Extender: Weeks in Treatment: 0 Constitutional . Pulse regular. Respirations normal and unlabored. Afebrile. . Eyes Nonicteric. Reactive to light. Ears, Nose, Mouth, and Throat Lips, teeth, and gums WNL.Marland Kitchen Moist mucosa without lesions . Neck supple and nontender. No palpable supraclavicular or cervical adenopathy. Normal sized without goiter. Respiratory WNL. No retractions.. Cardiovascular Pedal Pulses WNL. her INR on the left is 1.0 on the right is 1.05. she has stage II lymphedema both lower extremities. Chest Breasts symmetical and no nipple discharge.. Breast tissue WNL, no masses, lumps, or tenderness.. Gastrointestinal (GI) Abdomen without masses or tenderness.. No liver or spleen enlargement or tenderness.. Lymphatic No adneopathy. No adenopathy. No adenopathy. Musculoskeletal Adexa without tenderness or enlargement.. Digits and nails w/o clubbing, cyanosis, infection, petechiae, ischemia, or inflammatory conditions.. Integumentary (Hair, Skin) No suspicious lesions. No crepitus or fluctuance. No peri-wound warmth or erythema. No masses.Marland Kitchen Psychiatric Judgement and insight Intact.. No evidence of depression, anxiety, or agitation.. Notes she has got significant stage II lymphedema of the lower extremities and on the left leg she has got 2 open wounds with slough and this is in the midst of lichen planus skin changes. Electronic Signature(s) Signed: 04/15/2015 11:21:08 AM By: Christin Fudge MD, FACS Jane Short  ([id number]) Entered By: Christin Fudge on 04/15/2015 11:21:08 Jane Short ([id number]) -------------------------------------------------------------------------------- Physician Orders Details Patient Name: Jane Short, Jane Short 04/15/2015 10:15 Date of Service: AM Medical Record [id number] Number: Patient Account Number: [id number] November 23, 1950 (64 y.o. Treating RN: Montey Hora Date of Birth/Sex: Female) Other Clinician: Primary Care Physician: Ronette Deter Treating Christin Fudge Referring Physician: Nolon Stalls Physician/Extender: Suella Grove in Treatment: 0 Verbal / Phone Orders: Yes Clinician: Montey Hora Read Back and Verified: Yes Diagnosis Coding Wound Cleansing Wound #1 Left,Proximal,Anterior Lower Leg o Clean wound with Normal Saline. Wound #2 Left,Distal,Anterior Lower Leg o Clean wound with Normal Saline. Anesthetic Wound #1 Left,Proximal,Anterior Lower Leg o Topical Lidocaine 4% cream applied to wound bed prior to debridement Wound #2 Left,Distal,Anterior Lower Leg o Topical Lidocaine 4% cream applied to wound bed prior to debridement Skin Barriers/Peri-Wound Care Wound #1 Left,Proximal,Anterior Lower Leg o Skin Prep Wound #2 Left,Distal,Anterior Lower Leg o Skin Prep Primary Wound Dressing Wound #1 Left,Proximal,Anterior Lower Leg o Medihoney gel  Wound #2 Left,Distal,Anterior Lower Leg o Medihoney gel Secondary Dressing Wound #1 Left,Proximal,Anterior Lower Leg o Boardered Foam Dressing Wound #2 Left,Distal,Anterior Lower Leg o Boardered Foam Dressing MAKANA, OSTWALD ([id number]) Dressing Change Frequency Wound #1 Left,Proximal,Anterior Lower Leg o Change dressing every day. Wound #2 Left,Distal,Anterior Lower Leg o Change dressing every day. Follow-up Appointments Wound #1 Left,Proximal,Anterior Lower Leg o Return Appointment in 1 week. Wound #2 Left,Distal,Anterior Lower Leg o Return Appointment in 1  week. Edema Control Wound #1 Left,Proximal,Anterior Lower Leg o Patient to wear own compression stockings o Elevate legs to the level of the heart and pump ankles as often as possible Wound #2 Left,Distal,Anterior Lower Leg o Patient to wear own compression stockings o Elevate legs to the level of the heart and pump ankles as often as possible Additional Orders / Instructions Wound #1 Left,Proximal,Anterior Lower Leg o Stop Smoking Wound #2 Left,Distal,Anterior Lower Leg o Stop Smoking Electronic Signature(s) Signed: 04/15/2015 4:17:28 PM By: Christin Fudge MD, FACS Signed: 04/15/2015 5:00:47 PM By: Montey Hora Entered By: Montey Hora on 04/15/2015 11:05:58 Jane Short ([id number]) -------------------------------------------------------------------------------- Problem List Details Patient Name: Jane Short, Jane Short 04/15/2015 10:15 Date of Service: AM Medical Record [id number] Number: Patient Account Number: [id number] 1951/04/07 (65 y.o. 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 [id number] Lichen planus, unspecified 04/15/2015 Yes D57.1 Sickle-cell disease without crisis 04/15/2015 Yes N18.9 Chronic kidney disease, unspecified 04/15/2015 Yes F17.218 Nicotine dependence, cigarettes, 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/15/2015 Yes induced disorders Inactive Problems Resolved Problems Electronic Signature(s) Signed: 04/15/2015 11:17:40 AM By: Christin Fudge MD, FACS Previous Signature: 04/15/2015 11:15:58 AM Version By: Christin Fudge MD, FACS Entered By: Christin Fudge on 04/15/2015  11:17:40 Jane Short ([id number]) -------------------------------------------------------------------------------- Progress Note Details Patient Name: Jane Short 04/15/2015 10:15 Date of Service: AM Medical Record [id number] Number: Patient Account Number: [id number] 1951-03-05 (64 y.o. Treating RN: Montey Hora Date of Birth/Sex: Female) Other Clinician: Primary Care Physician: Ronette Deter Treating Allante Beane Referring Physician: Nolon Stalls Physician/Extender: Suella Grove in Treatment: 0 Subjective Chief Complaint Information obtained from Patient Patient presents to the wound care center for a consult due non healing wound left lower extremity with draining fluid for about 2 months History of Present Illness (HPI) The following HPI elements were documented for the patient's wound: Location: has had 2 open wounds on the left lower extremity with swelling of her legs Quality: Patient reports experiencing a dull pain to affected area(s). Severity: Patient states wound are getting worse. Duration: Patient has had the wound for > 2 months prior to seeking treatment at the wound center Timing: Pain in wound is constant (hurts all the time) Context: The wound appeared gradually over time. 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 Modifying Factors: Other treatment(s) tried include:Silvadene ointment Associated Signs and Symptoms: Patient reports having increase swelling. 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 Past medical history significant for sickle cell anemia, vitamin D deficiency, hypertension, osteoporosis, atrial  myxoma, gout, lichen planus and status post tubal ligation and  gallbladder surgery. She is a smoker and smokes about 10 cigarettes a day. Wound History Patient presents with 2 open wounds that have been present for approximately 1 month. Patient has been treating wounds in the following manner: clobetasol. Laboratory tests have not been performed in the last month. Patient reportedly has not tested positive for an antibiotic resistant organism. Patient reportedly has not tested positive for osteomyelitis. Patient reportedly has not had testing performed to evaluate circulation in the legs. Patient experiences the following problems associated with their wounds: swelling. Patient History Information obtained from Patient. Allergies ramipril (Reaction: cough) Jane Short, Jane Short ([id number]) Family History Diabetes - Father, Siblings, Heart Disease - Father, Hypertension - Father, No family history of Cancer, Hereditary Spherocytosis, Kidney Disease, Lung Disease, Seizures, Stroke, Thyroid Problems, Tuberculosis. Social History Current every day smoker, Marital Status - Married, Alcohol Use - Never, Drug Use - No History, Caffeine Use - Never. Medical History Hematologic/Lymphatic Patient has history of Sickle Cell Disease Respiratory Patient has history of Chronic Obstructive Pulmonary Disease (COPD) Cardiovascular Patient has history of Hypertension Musculoskeletal Patient has history of Gout Oncologic Denies history of Received Chemotherapy, Received Radiation Medical And Surgical History Notes Cardiovascular atrial myxoma Integumentary (Skin) lichen planus Review of Systems (ROS) Constitutional Symptoms (General Health) The patient has no complaints or symptoms. Eyes The patient has no complaints or symptoms. Ear/Nose/Mouth/Throat The patient has no complaints or symptoms. Respiratory The patient has no complaints or symptoms. Cardiovascular Complains or has symptoms of LE edema. Gastrointestinal The patient has no complaints or  symptoms. Endocrine The patient has no complaints or symptoms. Genitourinary Complains or has symptoms of Kidney failure/ Dialysis - CKD r/t sickle cell. Immunological The patient has no complaints or symptoms. Neurologic The patient has no complaints or symptoms. Psychiatric Jane Short, Jane Short ([id number]) The patient has no complaints or symptoms. Medications: Albuterol, Zyloprim, RoCalcitrol,psych low Benzapril, Folvite, Lasix, Hydrea, Cozaar, Dulera, Crestor, Silvadene cream. Objective Constitutional Pulse regular. Respirations normal and unlabored. Afebrile. Vitals Time Taken: 10:22 AM, Height: 66 in, Source: Stated, Weight: 198 lbs, Source: Measured, BMI: 32, Temperature: 97.8 F, Pulse: 75 bpm, Respiratory Rate: 18 breaths/min, Blood Pressure: 124/52 mmHg. Eyes Nonicteric. Reactive to light. Ears, Nose, Mouth, and Throat Lips, teeth, and gums WNL.Marland Kitchen Moist mucosa without lesions . Neck supple and nontender. No palpable supraclavicular or cervical adenopathy. Normal sized without goiter. Respiratory WNL. No retractions.. Cardiovascular Pedal Pulses WNL. her INR on the left is 1.0 on the right is 1.05. she has stage II lymphedema both lower extremities. Chest Breasts symmetical and no nipple discharge.. Breast tissue WNL, no masses, lumps, or tenderness.. Gastrointestinal (GI) Abdomen without masses or tenderness.. No liver or spleen enlargement or tenderness.. Lymphatic No adneopathy. No adenopathy. No adenopathy. Jane Short, Jane Short ([id number]) Musculoskeletal Adexa without tenderness or enlargement.. Digits and nails w/o clubbing, cyanosis, infection, petechiae, ischemia, or inflammatory conditions.Marland Kitchen Psychiatric Judgement and insight Intact.. No evidence of depression, anxiety, or agitation.. General Notes: she has got significant stage II lymphedema of the lower extremities and on the left leg she has got 2 open wounds with slough and this is in the midst of lichen  planus skin changes. Integumentary (Hair, Skin) No suspicious lesions. No crepitus or fluctuance. No peri-wound warmth or erythema. No masses.. Wound #1 status is Open. Original cause of wound was Gradually Appeared. The wound is located on the Left,Proximal,Anterior Lower Leg. The wound measures 1cm length x 1.3cm width x 0.2cm depth; 1.021cm^2  area and 0.204cm^3 volume. The wound is limited to skin breakdown. There is no tunneling or undermining noted. There is a medium amount of purulent drainage noted. The wound margin is flat and intact. There is no granulation within the wound bed. There is a large (67-100%) amount of necrotic tissue within the wound bed including Eschar and Adherent Slough. The periwound skin appearance exhibited: Moist. The periwound skin appearance did not exhibit: Callus, Crepitus, Excoriation, Fluctuance, Friable, Induration, Localized Edema, Rash, Scarring, Dry/Scaly, Maceration, Atrophie Blanche, Cyanosis, Ecchymosis, Hemosiderin Staining, Mottled, Pallor, Rubor, Erythema. Wound #2 status is Open. Original cause of wound was Gradually Appeared. The wound is located on the [facility] Lower Leg. The wound measures 0.8cm length x 0.3cm width x 0.1cm depth; 0.188cm^2 area and 0.019cm^3 volume. The wound is limited to skin breakdown. There is no tunneling or undermining noted. There is a small amount of purulent drainage noted. The wound margin is flat and intact. There is no granulation within the wound bed. There is a large (67-100%) amount of necrotic tissue within the wound bed including Eschar and Adherent Slough. The periwound skin appearance did not exhibit: Callus, Crepitus, Excoriation, Fluctuance, Friable, Induration, Localized Edema, Rash, Scarring, Dry/Scaly, Maceration, Moist, Atrophie Blanche, Cyanosis, Ecchymosis, Hemosiderin Staining, Mottled, Pallor, Rubor, Erythema. Assessment Active Problems ICD-10 I89.0 - Lymphedema, not elsewhere  classified L97.222 - Non-pressure chronic ulcer of left calf with fat layer exposed [id number] - Lichen planus, unspecified D57.1 - Sickle-cell disease without crisis N18.9 - Chronic kidney disease, unspecified F17.218 - Nicotine dependence, cigarettes, with other nicotine-induced disorders Jane Short, Jane Short ([id number]) I have recommended Medihoney to be applied daily with a bordered foam dressing and for her to use compression stockings of the 20-30 mm variety. I have also recommended elevation and exercise and continue to work with her nephrologist regarding her edema. She is also is to see a dermatologist locally as her last dermatologist was while she lived in [place]. We have spent some time discussing her smoking addiction and have discussed the risks benefits and alternatives to smoking cessation and have strongly advised her to completely give up smoking. She says she is currently compliant. She has been given appointments to come and see as every week. Procedures Wound #1 Wound #1 is a To be determined located on the Left,Proximal,Anterior Lower Leg . There was a Skin/Subcutaneous Tissue Debridement [id number]) debridement with total area of 1.3 sq cm performed by Christin Fudge, MD. with the following instrument(s): Curette to remove Viable and Non-Viable tissue/material including Fibrin/Slough, Eschar, and Subcutaneous after achieving pain control using Lidocaine 4% Topical Solution. A time out was conducted prior to the start of the procedure. There was no bleeding. The procedure was tolerated well with a pain level of 0 throughout and a pain level of 0 following the procedure. Post Debridement Measurements: 1cm length x 1.3cm width x 0.2cm depth; 0.204cm^3 volume. Post procedure Diagnosis Wound #1: Same as Pre-Procedure Wound #2 Wound #2 is a To be determined located on the Left,Distal,Anterior Lower Leg . There was a Skin/Subcutaneous Tissue Debridement [id number])  debridement with total area of 0.24 sq cm performed by Christin Fudge, MD. with the following instrument(s): Curette to remove Viable and Non-Viable tissue/material including Fibrin/Slough, Eschar, and Subcutaneous after achieving pain control using Lidocaine 4% Topical Solution. A time out was conducted prior to the start of the procedure. There was no bleeding. The procedure was tolerated well with a pain level of 0 throughout and a pain level of 0 following the procedure. Post  Debridement Measurements: 0.8cm length x 0.3cm width x 0.1cm depth; 0.019cm^3 volume. Post procedure Diagnosis Wound #2: Same as Pre-Procedure Plan Wound Cleansing: Wound #1 Left,Proximal,Anterior Lower Leg: Jane Short, Jane Short ([id number]) Clean wound with Normal Saline. Wound #2 Left,Distal,Anterior Lower Leg: Clean wound with Normal Saline. Anesthetic: Wound #1 Left,Proximal,Anterior Lower Leg: Topical Lidocaine 4% cream applied to wound bed prior to debridement Wound #2 Left,Distal,Anterior Lower Leg: Topical Lidocaine 4% cream applied to wound bed prior to debridement Skin Barriers/Peri-Wound Care: Wound #1 Left,Proximal,Anterior Lower Leg: Skin Prep Wound #2 Left,Distal,Anterior Lower Leg: Skin Prep Primary Wound Dressing: Wound #1 Left,Proximal,Anterior Lower Leg: Medihoney gel Wound #2 Left,Distal,Anterior Lower Leg: Medihoney gel Secondary Dressing: Wound #1 Left,Proximal,Anterior Lower Leg: Boardered Foam Dressing Wound #2 Left,Distal,Anterior Lower Leg: Boardered Foam Dressing Dressing Change Frequency: Wound #1 Left,Proximal,Anterior Lower Leg: Change dressing every day. Wound #2 Left,Distal,Anterior Lower Leg: Change dressing every day. Follow-up Appointments: Wound #1 Left,Proximal,Anterior Lower Leg: Return Appointment in 1 week. Wound #2 Left,Distal,Anterior Lower Leg: Return Appointment in 1 week. Edema Control: Wound #1 Left,Proximal,Anterior Lower Leg: Patient to wear own  compression stockings Elevate legs to the level of the heart and pump ankles as often as possible Wound #2 Left,Distal,Anterior Lower Leg: Patient to wear own compression stockings Elevate legs to the level of the heart and pump ankles as often as possible Additional Orders / Instructions: Wound #1 Left,Proximal,Anterior Lower Leg: Stop Smoking Wound #2 Left,Distal,Anterior Lower Leg: Stop Smoking Jane Short, DELUCCIA ([id number]) I have recommended Medihoney to be applied daily with a bordered foam dressing and for her to use compression stockings of the 20-30 mm variety. I have also recommended elevation and exercise and continue to work with her nephrologist regarding her edema. She is also is to see a dermatologist locally as her last dermatologist was while she lived in [place]. We have spent some time discussing her smoking addiction and have discussed the risks benefits and alternatives to smoking cessation and have strongly advised her to completely give up smoking. She says she is currently compliant. She has been given appointments to come and see as every week. Electronic Signature(s) Signed: 04/15/2015 11:25:22 AM By: Christin Fudge MD, FACS Entered By: Christin Fudge on 04/15/2015 11:25:22 Jane Short ([id number]) -------------------------------------------------------------------------------- ROS/PFSH Details Patient Name: AZALINE, MOUCH 04/15/2015 10:15 Date of Service: AM Medical Record [id number] Number: Patient Account Number: [id number] [birth year] (64 y.o. Treating RN: Montey Hora Date of Birth/Sex: Female) Other Clinician: Primary Care Physician: Ronette Deter Treating Christin Fudge Referring Physician: Nolon Stalls Physician/Extender: Suella [place] in Treatment: 0 Information Obtained From Patient Wound History Do you currently have one or more open woundso Yes How many open wounds do you currently haveo 2 Approximately how long have you had your  woundso 1 month How have you been treating your wound(s) until nowo clobetasol Has your wound(s) ever healed and then re-openedo No Have you had any lab work done in the past montho No Have you tested positive for an antibiotic resistant organism (MRSA, VRE)o No Have you tested positive for osteomyelitis (bone infection)o No Have you had any tests for circulation on your legso No Have you had other problems associated with your woundso Swelling Cardiovascular Complaints and Symptoms: Positive for: LE edema Medical History: Positive for: Hypertension Past Medical History Notes: atrial myxoma Genitourinary Complaints and Symptoms: Positive for: 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 (  [id number]) Complaints and Symptoms: No Complaints or Symptoms Hematologic/Lymphatic Medical History: Positive for: Sickle Cell Disease Respiratory Complaints and Symptoms: No Complaints or Symptoms Medical History: Positive for: Chronic Obstructive Pulmonary Disease (COPD) Gastrointestinal Complaints and Symptoms: No Complaints or Symptoms Endocrine Complaints and Symptoms: No Complaints or Symptoms Immunological Complaints and Symptoms: No Complaints or Symptoms Integumentary (Skin) Medical History: Past Medical History Notes: lichen planus Musculoskeletal Medical History: Positive for: Gout Neurologic Complaints and Symptoms: No Complaints or Symptoms Oncologic Medical History: Negative for: Received Chemotherapy; Received Radiation KINETA, BURBANK ([id number]) Psychiatric Complaints and Symptoms: No Complaints or Symptoms Family and Social History Cancer: No; Diabetes: Yes - Father, Siblings; Heart Disease: Yes - Father; Hereditary Spherocytosis: No; Hypertension: Yes - Father;  Kidney Disease: No; Lung Disease: No; Seizures: No; Stroke: No; Thyroid Problems: No; Tuberculosis: No; Current every day smoker; Marital Status - Married; Alcohol Use: Never; Drug Use: No History; Caffeine Use: Never; Financial Concerns: No; Food, Clothing or Shelter Needs: No; Support System Lacking: No; Transportation Concerns: No; Advanced Directives: No; Patient does not want information on Advanced Directives Physician Affirmation I have reviewed and agree with the above information. Electronic Signature(s) Signed: 04/15/2015 10:43:45 AM By: Christin Fudge MD, FACS Signed: 04/15/2015 5:00:47 PM By: Montey Hora Entered By: Christin Fudge on 04/15/2015 10:43:44 Jane Short ([id number]) -------------------------------------------------------------------------------- SuperBill Details Patient Name: Jane Short Date of Service: 04/15/2015 Medical Record Number: [id number] Patient Account Number: [id number] Date of Birth/Sex: 05/26/1951 (64 y.o. Female) Treating RN: Montey Hora Primary Care Physician: Ronette Deter Other Clinician: Referring Physician: Nolon Stalls Treating Physician/Extender: Frann Rider in Treatment: 0 Diagnosis Coding ICD-10 Codes Code Description I89.0 Lymphedema, not elsewhere classified 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 with fat layer exposed [id number] Lichen planus, unspecified D57.1 Sickle-cell disease without crisis N18.9 Chronic kidney disease, unspecified F17.218 Nicotine dependence, cigarettes, with other nicotine-induced disorders Facility Procedures CPT4 Code Description: [id number] - WOUND CARE VISIT-LEV 3 EST PT Modifier: Quantity: 1 CPT4 Code Description: [id number] - DEB SUBQ TISSUE 20 SQ CM/< ICD-10 Description Diagnosis I89.0 Lymphedema, not elsewhere classified 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 with fat lay N18.9 Chronic kidney disease, unspecified F17.218  Nicotine dependence,  cigarettes, with other nicotine Modifier: er exposed -induced diso Quantity: 1 rders CPT4 Code Description: [id number]-[id number] CESSATION 3-10MINS ICD-10 Description Diagnosis F17.218 Nicotine dependence, cigarettes, with other nicotine Modifier: -induced diso Quantity: 1 rders Physician Procedures CPT4 Code Description: [id number] - WC PHYS LEVEL 4 - NEW PT ICD-10 Description Diagnosis I89.0 Lymphedema, not elsewhere classified 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

## 2015-04-16 NOTE — Progress Notes (Signed)
Jane Short, Jane Short ([id number]) Visit Report for 04/15/2015 Allergy List Details Patient Name: Jane Short, Jane Short. Date of Service: 04/15/2015 10:15 AM Medical Record Number: [id number] Patient Account Number: [id number] Date of Birth/Sex: 1951-03-19 (65 y.o. Female) Treating RN: Montey Hora Primary Care Physician: Ronette Deter Other Clinician: Referring Physician: Nolon Stalls Treating Physician/Extender: Frann Rider in Treatment: 0 Allergies Active Allergies ramipril Reaction: cough Allergy Notes Electronic Signature(s) Signed: 04/15/2015 5:00:47 PM By: Montey Hora Entered By: Montey Hora on 04/15/2015 10:26:47 Jane Short ([id number]) -------------------------------------------------------------------------------- [place] Information Details Patient Name: Jane Short Date of Service: 04/15/2015 10:15 AM Medical Record Number: [id number] Patient Account Number: [id number] Date of Birth/Sex: [birth year] (64 y.o. Female) Treating RN: Montey Hora Primary Care Physician: Ronette Deter Other Clinician: Referring Physician: Nolon Stalls Treating Physician/Extender: Frann Rider in Treatment: 0 Visit Information Patient Arrived: Ambulatory Arrival Time: 10:21 Accompanied By: self Transfer Assistance: None Patient Identification Verified: Yes Secondary Verification Process Yes Completed: Electronic Signature(s) Signed: 04/15/2015 5:00:47 PM By: Montey Hora Entered By: Montey Hora on 04/15/2015 10:22:38 Jane Short ([id number]) -------------------------------------------------------------------------------- Clinic Level of Care Assessment Details Patient Name: Jane Short Date of Service: 04/15/2015 10:15 AM Medical Record Number: [id number] Patient Account Number: [id number] Date of Birth/Sex: 05-25-1951 (64 y.o. Female) Treating RN: Montey Hora Primary Care Physician: Ronette Deter Other  Clinician: Referring Physician: Nolon Stalls Treating Physician/Extender: Frann Rider in Treatment: 0 Clinic Level of Care Assessment Items TOOL 1 Quantity Score []  - Use when EandM and Procedure is performed on INITIAL visit 0 ASSESSMENTS - Nursing Assessment / Reassessment X - General Physical Exam (combine w/ comprehensive assessment (listed just 1 20 below) when performed on new pt. evals) X - Comprehensive Assessment (HX, ROS, Risk Assessments, Wounds Hx, etc.) 1 25 ASSESSMENTS - Wound and Skin Assessment / Reassessment []  - Dermatologic / Skin Assessment (not related to wound area) 0 ASSESSMENTS - Ostomy and/or Continence Assessment and Care []  - Incontinence Assessment and Management 0 []  - Ostomy Care Assessment and Management (repouching, etc.) 0 PROCESS - Coordination of Care X - Simple Patient / Family Education for ongoing care 1 15 []  - Complex (extensive) Patient / Family Education for ongoing care 0 X - Staff obtains Programmer, systems, Records, Test Results / Process Orders 1 10 []  - Staff telephones HHA, Nursing Homes / Clarify orders / etc 0 []  - Routine Transfer to another Facility (non-emergent condition) 0 []  - Routine Hospital Admission (non-emergent condition) 0 X - New Admissions / Biomedical engineer / Ordering NPWT, Apligraf, etc. 1 15 []  - Emergency Hospital Admission (emergent condition) 0 PROCESS - Special Needs []  - Pediatric / Minor Patient Management 0 []  - Isolation Patient Management 0 Jane Short, Jane Short ([id number]) []  - Hearing / Language / Visual special needs 0 []  - Assessment of Community assistance (transportation, D/C planning, etc.) 0 []  - Additional assistance / Altered mentation 0 []  - Support Surface(s) Assessment (bed, cushion, seat, etc.) 0 INTERVENTIONS - Miscellaneous []  - External ear exam 0 []  - Patient Transfer (multiple staff / Civil Service fast streamer / Similar devices) 0 []  - Simple Staple / Suture removal (25 or less) 0 []  -  Complex Staple / Suture removal (26 or more) 0 []  - Hypo/Hyperglycemic Management (do not check if billed separately) 0 X - Ankle / Brachial Index (ABI) - do not check if billed separately 1 15 Has the patient been seen at the hospital within the last three years: Yes Total Score: 100 Level Of Care: New/Established - Level 3  Electronic Signature(s) Signed: 04/15/2015 5:00:47 PM By: Montey Hora Entered By: Montey Hora on 04/15/2015 11:02:53 Jane Short ([id number]) -------------------------------------------------------------------------------- Encounter Discharge Information Details Patient Name: Jane Short Date of Service: 04/15/2015 10:15 AM Medical Record Number: [id number] Patient Account Number: [id number] Date of Birth/Sex: 1951/02/27 (64 y.o. Female) Treating RN: Montey Hora Primary Care Physician: Ronette Deter Other Clinician: Referring Physician: Nolon Stalls Treating Physician/Extender: Frann Rider in Treatment: 0 Encounter Discharge Information Items Discharge Pain Level: 0 Discharge Condition: Stable Ambulatory Status: Ambulatory Discharge Destination: Home Transportation: Private Auto Accompanied By: self Schedule Follow-up Appointment: Yes Medication Reconciliation completed and provided to Patient/Care No Kalman Nylen: Provided on Clinical Summary of Care: 04/15/2015 Form Type Recipient Paper Patient JW Electronic Signature(s) Signed: 04/15/2015 11:21:01 AM By: Ruthine Dose Entered By: Ruthine Dose on 04/15/2015 11:21:01 Jane Short ([id number]) -------------------------------------------------------------------------------- Lower Extremity Assessment Details Patient Name: Jane Short Date of Service: 04/15/2015 10:15 AM Medical Record Number: [id number] Patient Account Number: [id number] Date of Birth/Sex: Apr 08, 1951 (64 y.o. Female) Treating RN: Montey Hora Primary Care Physician: Ronette Deter Other  Clinician: Referring Physician: Nolon Stalls Treating Physician/Extender: Frann Rider in Treatment: 0 Edema Assessment Assessed: [Left: No] [Right: No] Edema: [Left: Yes] [Right: Yes] Calf Left: Right: Point of Measurement: 34 cm From Medial Instep 40.2 cm 40.4 cm Ankle Left: Right: Point of Measurement: 10 cm From Medial Instep 20.2 cm 22.1 cm Vascular Assessment Pulses: Posterior Tibial Palpable: [Left:Yes] [Right:Yes] Doppler: [Left:Multiphasic] [Right:Multiphasic] Dorsalis Pedis Palpable: [Left:Yes] [Right:Yes] Doppler: [Left:Multiphasic] [Right:Multiphasic] Extremity colors, hair growth, and conditions: Extremity Color: [Left:Normal] [Right:Normal] Hair Growth on Extremity: [Left:No] [Right:No] Temperature of Extremity: [Left:Warm] [Right:Warm] Capillary Refill: [Left:< 3 seconds] [Right:< 3 seconds] Blood Pressure: Brachial: [Left:126] [Right:122] Dorsalis Pedis: 126 [Left:Dorsalis Pedis: [id number] Ankle: Posterior Tibial: 124 [Left:Posterior Tibial: 132 1.00] [Right:1.05] Toe Nail Assessment Left: Right: Thick: No No Discolored: No No Deformed: No No Improper Length and Hygiene: No No Jane Short, Jane Short ([id number]) Electronic Signature(s) Signed: 04/15/2015 5:00:47 PM By: Montey Hora Entered By: Montey Hora on 04/15/2015 10:53:50 Jane Short ([id number]) -------------------------------------------------------------------------------- Multi Wound Chart Details Patient Name: Jane Short Date of Service: 04/15/2015 10:15 AM Medical Record Number: [id number] Patient Account Number: [id number] Date of Birth/Sex: 13-Sep-1950 (64 y.o. Female) Treating RN: Montey Hora Primary Care Physician: Ronette Deter Other Clinician: Referring Physician: Nolon Stalls Treating Physician/Extender: Frann Rider in Treatment: 0 Vital Signs Height(in): 66 Pulse(bpm): 75 Weight(lbs): 198 Blood Pressure 124/52 (mmHg): Body Mass  Index(BMI): 32 Temperature(F): 97.8 Respiratory Rate 18 (breaths/min): Photos: [1:No Photos] [2:No Photos] [N/A:N/A] Wound Location: [1:Left Lower Leg - Anterior Left Lower Leg - Anterior,] [2:Distal] [N/A:N/A] Wounding Event: [1:Gradually Appeared] [2:Gradually Appeared] [N/A:N/A] Primary Etiology: [1:To be determined] [2:To be determined] [N/A:N/A] Comorbid History: [1:Sickle Cell Disease, Chronic Obstructive Pulmonary Disease (COPD), Hypertension, Gout] [2:Sickle Cell Disease, Chronic Obstructive Pulmonary Disease (COPD), Hypertension, Gout] [N/A:N/A] Date Acquired: [1:03/18/2015] [2:03/18/2015] [N/A:N/A] Weeks of Treatment: [1:0] [2:0] [N/A:N/A] Wound Status: [1:Open] [2:Open] [N/A:N/A] Measurements L x W x D 1x1.3x0.2 [2:0.8x0.3x0.1] [N/A:N/A] (cm) Area (cm) : [1:1.021] [2:0.188] [N/A:N/A] Volume (cm) : [1:0.204] [2:0.019] [N/A:N/A] Classification: [1:Full Thickness Without Exposed Support Structures] [2:Full Thickness Without Exposed Support Structures] [N/A:N/A] Exudate Amount: [1:Medium] [2:Small] [N/A:N/A] Exudate Type: [1:Purulent] [2:Purulent] [N/A:N/A] Exudate Color: [1:yellow, brown, green] [2:yellow, brown, green] [N/A:N/A] Wound Margin: [1:Flat and Intact] [2:Flat and Intact] [N/A:N/A] Granulation Amount: [1:None Present (0%)] [2:None Present (0%)] [N/A:N/A] Necrotic Amount: [1:Large (67-100%)] [2:Large (67-100%)] [N/A:N/A] Necrotic Tissue: [1:Eschar, Adherent Slough Eschar, Adherent Slough] [N/A:N/A] Exposed Structures: [1:Fascia: No Fat: No Tendon:  No] [2:Fascia: No Fat: No Tendon: No] [N/A:N/A] Muscle: No Muscle: No Joint: No Joint: No Bone: No Bone: No Limited to Skin Limited to Skin Breakdown Breakdown Epithelialization: None None N/A Periwound Skin Texture: Edema: No Edema: No N/A Excoriation: No Excoriation: No Induration: No Induration: No Callus: No Callus: No Crepitus: No Crepitus: No Fluctuance: No Fluctuance: No Friable: No Friable:  No Rash: No Rash: No Scarring: No Scarring: No Periwound Skin Moist: Yes Maceration: No N/A Moisture: Maceration: No Moist: No Dry/Scaly: No Dry/Scaly: No Periwound Skin Color: Atrophie Blanche: No Atrophie Blanche: No N/A Cyanosis: No Cyanosis: No Ecchymosis: No Ecchymosis: No Erythema: No Erythema: No Hemosiderin Staining: No Hemosiderin Staining: No Mottled: No Mottled: No Pallor: No Pallor: No Rubor: No Rubor: No Tenderness on No No N/A Palpation: Wound Preparation: Ulcer Cleansing: Ulcer Cleansing: N/A Rinsed/Irrigated with Rinsed/Irrigated with Saline Saline Topical Anesthetic Topical Anesthetic Applied: Other: lidocaine Applied: Other: lidocaine 4% 4% Treatment Notes Electronic Signature(s) Signed: 04/15/2015 5:00:47 PM By: Montey Hora Entered By: Montey Hora on 04/15/2015 11:01:33 Jane Short ([id number]) -------------------------------------------------------------------------------- [place] Details Patient Name: Jane Short Date of Service: 04/15/2015 10:15 AM Medical Record Number: [id number] Patient Account Number: [id number] Date of Birth/Sex: 03-Jul-1950 (64 y.o. Female) Treating RN: Montey Hora Primary Care Physician: Ronette Deter Other Clinician: Referring Physician: Nolon Stalls Treating Physician/Extender: Frann Rider in Treatment: 0 Active Inactive Abuse / Safety / Falls / Self Care Management Nursing Diagnoses: Impaired physical mobility Potential for falls Goals: Patient will remain injury free Date Initiated: 04/15/2015 Goal Status: Active Interventions: Assess fall risk on admission and as needed Notes: Orientation to the Wound Care Program Nursing Diagnoses: Knowledge deficit related to the wound healing center program Goals: Patient/caregiver will verbalize understanding of the [facility] Date Initiated: 04/15/2015 Goal Status:  Active Interventions: Provide education on orientation to the wound center Notes: Wound/Skin Impairment Nursing Diagnoses: Impaired tissue integrity Goals: Patient/caregiver will verbalize understanding of skin care regimen Jane Short, Jane Short ([id number]) Date Initiated: 04/15/2015 Goal Status: Active Ulcer/skin breakdown will have a volume reduction of 30% by week 4 Date Initiated: 04/15/2015 Goal Status: Active Ulcer/skin breakdown will have a volume reduction of 50% by week 8 Date Initiated: 04/15/2015 Goal Status: Active Ulcer/skin breakdown will have a volume reduction of 80% by week 12 Date Initiated: 04/15/2015 Goal Status: Active Ulcer/skin breakdown will heal within 14 weeks Date Initiated: 04/15/2015 Goal Status: Active Interventions: Assess ulceration(s) every visit Notes: Electronic Signature(s) Signed: 04/15/2015 5:00:47 PM By: Montey Hora Entered By: Montey Hora on 04/15/2015 11:01:15 Jane Short ([id number]) -------------------------------------------------------------------------------- Patient/Caregiver Education Details Patient Name: Jane Short Date of Service: 04/15/2015 10:15 AM Medical Record Number: [id number] Patient Account Number: [id number] Date of Birth/Gender: 1950/09/13 (64 y.o. Female) Treating RN: Montey Hora Primary Care Physician: Ronette Deter Other Clinician: Referring Physician: Nolon Stalls Treating Physician/Extender: Frann Rider in Treatment: 0 Education Assessment Education Provided To: Patient Education Topics Provided Wound/Skin Impairment: Handouts: Other: wound care as ordered Methods: Demonstration, Explain/Verbal Responses: State content correctly Electronic Signature(s) Signed: 04/15/2015 5:00:47 PM By: Montey Hora Entered By: Montey Hora on 04/15/2015 11:19:02 Jane Short ([id number]) -------------------------------------------------------------------------------- Wound  Assessment Details Patient Name: Jane Short Date of Service: 04/15/2015 10:15 AM Medical Record Number: [id number] Patient Account Number: [id number] Date of Birth/Sex: 03-13-1951 (64 y.o. Female) Treating RN: Montey Hora Primary Care Physician: Ronette Deter Other Clinician: Referring Physician: Nolon Stalls Treating Physician/Extender: Frann Rider in Treatment: 0 Wound Status Wound Number: 1 Primary To be  determined Etiology: Wound Location: Left Lower Leg - Anterior Wound Open Wounding Event: Gradually Appeared Status: Date Acquired: 03/18/2015 Comorbid Sickle Cell Disease, Chronic Weeks Of Treatment: 0 History: Obstructive Pulmonary Disease Clustered Wound: No (COPD), Hypertension, Gout Photos Photo Uploaded By: Montey Hora on 04/15/2015 12:48:44 Wound Measurements Length: (cm) 1 Width: (cm) 1.3 Depth: (cm) 0.2 Area: (cm) 1.021 Volume: (cm) 0.204 % Reduction in Area: % Reduction in Volume: Epithelialization: None Tunneling: No Undermining: No Wound Description Full Thickness Without Exposed Classification: Support Structures Wound Margin: Flat and Intact Exudate Medium Amount: Exudate Type: Purulent Exudate Color: yellow, brown, green Foul Odor After Cleansing: No Wound Bed Granulation Amount: None Present (0%) Exposed Structure Necrotic Amount: Large (67-100%) Fascia Exposed: No Jane Short, Jane Short ([id number]) Necrotic Quality: Eschar, Adherent Slough Fat Layer Exposed: No Tendon Exposed: No Muscle Exposed: No Joint Exposed: No Bone Exposed: No Limited to Skin Breakdown Periwound Skin Texture Texture Color No Abnormalities Noted: No No Abnormalities Noted: No Callus: No Atrophie Blanche: No Crepitus: No Cyanosis: No Excoriation: No Ecchymosis: No Fluctuance: No Erythema: No Friable: No Hemosiderin Staining: No Induration: No Mottled: No Localized Edema: No Pallor: No Rash: No Rubor: No Scarring:  No Moisture No Abnormalities Noted: No Dry / Scaly: No Maceration: No Moist: Yes Wound Preparation Ulcer Cleansing: Rinsed/Irrigated with Saline Topical Anesthetic Applied: Other: lidocaine 4%, Electronic Signature(s) Signed: 04/15/2015 5:00:47 PM By: Montey Hora Entered By: Montey Hora on 04/15/2015 10:44:17 Jane Short ([id number]) -------------------------------------------------------------------------------- Wound Assessment Details Patient Name: Jane Short Date of Service: 04/15/2015 10:15 AM Medical Record Number: [id number] Patient Account Number: [id number] Date of Birth/Sex: [birth year] (64 y.o. Female) Treating RN: Montey Hora Primary Care Physician: Ronette Deter Other Clinician: Referring Physician: Nolon Stalls Treating Physician/Extender: Frann Rider in Treatment: 0 Wound Status Wound Number: 2 Primary To be determined Etiology: Wound Location: Left Lower Leg - Anterior, Distal Wound Open Status: Wounding Event: Gradually Appeared Comorbid Sickle Cell Disease, Chronic Date Acquired: 03/18/2015 History: Obstructive Pulmonary Disease Weeks Of Treatment: 0 (COPD), Hypertension, Gout Clustered Wound: No Photos Photo Uploaded By: Montey Hora on 04/15/2015 12:48:45 Wound Measurements Length: (cm) 0.8 Width: (cm) 0.3 Depth: (cm) 0.1 Area: (cm) 0.188 Volume: (cm) 0.019 % Reduction in Area: % Reduction in Volume: Epithelialization: None Tunneling: No Undermining: No Wound Description Full Thickness Without Exposed Classification: Support Structures Wound Margin: Flat and Intact Exudate Small Amount: Exudate Type: Purulent Exudate Color: yellow, brown, green Foul Odor After Cleansing: No Wound Bed Granulation Amount: None Present (0%) Exposed Structure Necrotic Amount: Large (67-100%) Fascia Exposed: No Jane Short, Jane Short ([id number]) Necrotic Quality: Eschar, Adherent Slough Fat Layer Exposed: No Tendon  Exposed: No Muscle Exposed: No Joint Exposed: No Bone Exposed: No Limited to Skin Breakdown Periwound Skin Texture Texture Color No Abnormalities Noted: No No Abnormalities Noted: No Callus: No Atrophie Blanche: No Crepitus: No Cyanosis: No Excoriation: No Ecchymosis: No Fluctuance: No Erythema: No Friable: No Hemosiderin Staining: No Induration: No Mottled: No Localized Edema: No Pallor: No Rash: No Rubor: No Scarring: No Moisture No Abnormalities Noted: No Dry / Scaly: No Maceration: No Moist: No Wound Preparation Ulcer Cleansing: Rinsed/Irrigated with Saline Topical Anesthetic Applied: Other: lidocaine 4%, Electronic Signature(s) Signed: 04/15/2015 5:00:47 PM By: Montey Hora Entered By: Montey Hora on 04/15/2015 10:45:44 Jane Short ([id number]) -------------------------------------------------------------------------------- Vitals Details Patient Name: Jane Short Date of Service: 04/15/2015 10:15 AM Medical Record Number: [id number] Patient Account Number: [id number] Date of Birth/Sex: [birth year] (64 y.o. Female) Treating RN: Montey Hora Primary Care Physician: Ronette Deter  Other Clinician: Referring Physician: Nolon Stalls Treating Physician/Extender: Frann Rider in Treatment: 0 Vital Signs Time Taken: 10:22 Temperature (F): 97.8 Height (in): 66 Pulse (bpm): 75 Source: Stated Respiratory Rate (breaths/min): 18 Weight (lbs): 198 Blood Pressure (mmHg): 124/52 Source: Measured Reference Range: 80 - 120 mg / dl Body Mass Index (BMI): 32 Electronic Signature(s) Signed: 04/15/2015 5:00:47 PM By: Montey Hora Entered By: Montey Hora on 04/15/2015 10:24:56

## 2015-04-17 ENCOUNTER — Inpatient Hospital Stay: Payer: BLUE CROSS/BLUE SHIELD

## 2015-04-17 DIAGNOSIS — K439 Ventral hernia without obstruction or gangrene: Secondary | ICD-10-CM | POA: Insufficient documentation

## 2015-04-17 NOTE — Assessment & Plan Note (Signed)
Pt is agreeable to seeing general surgery for surgical consultation for ventral hernia.

## 2015-04-17 NOTE — Assessment & Plan Note (Signed)
Asked pt to use her albuterol inhaler for wheezing. She did not bring it in today. Will follow

## 2015-04-17 NOTE — Assessment & Plan Note (Signed)
Flexeril sent to pharmacy for back muscle spasms. Will follow up as needed.

## 2015-04-22 ENCOUNTER — Encounter: Payer: BLUE CROSS/BLUE SHIELD | Admitting: Surgery

## 2015-04-22 DIAGNOSIS — L97222 Non-pressure chronic ulcer of left calf with fat layer exposed: Secondary | ICD-10-CM | POA: Diagnosis not present

## 2015-04-22 LAB — TYPE AND SCREEN
ABO/RH(D): O POS
Antibody Screen: NEGATIVE
Unit division: 0

## 2015-04-23 NOTE — Progress Notes (Addendum)
Jane Short ([id number]) Visit Report for 04/22/2015 Arrival Information Details Patient Name: Jane Short, Jane Short 04/22/2015 8:00 Date of Service: AM Medical Record [id number] Number: Patient Account Number: [id number] 1951/02/06 (64 y.o. Treating RN: Macarthur Critchley Date of Birth/Sex: Female) Other Clinician: Primary Care Physician: Ronette Deter Treating Christin Fudge Referring Physician: Ronette Deter Physician/Extender: Suella Grove in Treatment: 1 Visit Information History Since Last Visit All ordered tests and consults were completed: No Patient Arrived: Ambulatory Added or deleted any medications: No Arrival Time: 08:01 Any new allergies or adverse reactions: No Accompanied By: self Had a fall or experienced change in No Transfer Assistance: None activities of daily living that may affect Patient Identification Verified: Yes risk of falls: Secondary Verification Process Yes Signs or symptoms of abuse/neglect since last No Completed: visito Patient Requires Transmission-Based No Hospitalized since last visit: No Precautions: Has Dressing in Place as Prescribed: Yes Patient Has Alerts: No Pain Present Now: No Electronic Signature(s) Signed: 04/22/2015 8:04:17 AM By: Regan Lemming BSN, RN Signed: 04/22/2015 4:23:47 PM By: Rebecca Eaton RN, Sendra Entered By: Rebecca Eaton RN, Sendra on 04/22/2015 08:04:17 [name] ([id number]) -------------------------------------------------------------------------------- Encounter Discharge Information Details Patient Name: Jane Short 04/22/2015 8:00 Date of Service: AM Medical Record [id number] Number: Patient Account Number: [id number] Nov 15, 1950 (64 y.o. Treating RN: Macarthur Critchley Date of Birth/Sex: Female) Other Clinician: Primary Care Physician: Ronette Deter Treating Christin Fudge Referring Physician: Ronette Deter Physician/Extender: Suella Grove in Treatment: 1 Encounter Discharge Information  Items Discharge Pain Level: 0 Discharge Condition: Stable Ambulatory Status: Ambulatory Discharge Destination: Home Transportation: Other Accompanied By: self Schedule Follow-up Appointment: Yes Medication Reconciliation completed and provided to Patient/Care Yes Leva Baine: Provided on Clinical Summary of Care: 04/22/2015 Form Type Recipient Paper Patient JW Electronic Signature(s) Signed: 04/22/2015 4:23:47 PM By: Rebecca Eaton RN, Sendra Previous Signature: 04/22/2015 8:44:48 AM Version By: Ruthine Dose Entered By: Rebecca Eaton RN, Sendra on 04/22/2015 08:51:52 [name] ([id number]) -------------------------------------------------------------------------------- Lower Extremity Assessment Details Patient Name: Jane Short 04/22/2015 8:00 Date of Service: AM Medical Record [id number] Number: Patient Account Number: [id number] 10/22/1950 (64 y.o. Treating RN: Macarthur Critchley Date of Birth/Sex: Female) Other Clinician: Primary Care Physician: Ronette Deter Treating Christin Fudge Referring Physician: Ronette Deter Physician/Extender: Suella Grove in Treatment: 1 Edema Assessment Assessed: [Left: No] [Right: No] Edema: [Left: Ye] [Right: s] Calf Left: Right: Point of Measurement: cm From Medial Instep 38.8 cm cm Ankle Left: Right: Point of Measurement: cm From Medial Instep 19.2 cm cm Vascular Assessment Claudication: Claudication Assessment [Left:None] Pulses: Posterior Tibial Palpable: [Left:Yes] Dorsalis Pedis Palpable: [Left:Yes] Extremity colors, hair growth, and conditions: Extremity Color: [Left:Normal] Hair Growth on Extremity: [Left:No] Temperature of Extremity: [Left:Warm] Capillary Refill: [Left:< 3 seconds] Dependent Rubor: [Left:No] Blanched when Elevated: [Left:No] Lipodermatosclerosis: [Left:No] Toe Nail Assessment Left: Right: Thick: No Discolored: No Deformed: No LEXANY, PELLMAN ([id number]) Improper Length and Hygiene:  No Electronic Signature(s) Signed: 04/22/2015 4:23:47 PM By: Rebecca Eaton RN, Roslynn Amble Previous Signature: 04/22/2015 8:11:09 AM Version By: Regan Lemming BSN, RN Entered By: Rebecca Eaton, RN, Sendra on 04/22/2015 08:27:23 [name] ([id number]) -------------------------------------------------------------------------------- Multi-Disciplinary Care Plan Details Patient Name: Jane Short 04/22/2015 8:00 Date of Service: AM Medical Record [id number] Number: Patient Account Number: [id number] 04/16/1951 (64 y.o. Treating RN: Macarthur Critchley Date of Birth/Sex: Female) Other Clinician: Primary Care Physician: Ronette Deter Treating Christin Fudge Referring Physician: Ronette Deter Physician/Extender: Suella Grove in Treatment: 1 Active Inactive Abuse / Safety / Falls / Self Care Management Nursing Diagnoses: Impaired physical mobility Potential for falls Goals: Patient will remain injury free Date Initiated: 04/15/2015  Goal Status: Active Interventions: Assess fall risk on admission and as needed Notes: has not had any falls since admission to wound center Orientation to the Wound Care Program Nursing Diagnoses: Knowledge deficit related to the wound healing center program Goals: Patient/caregiver will verbalize understanding of the [facility] Date Initiated: 04/15/2015 Goal Status: Active Interventions: Provide education on orientation to the wound center Notes: Wound/Skin Impairment Nursing Diagnoses: Impaired tissue integrity VENNETTA, BARNFIELD ([id number]) Goals: Patient/caregiver will verbalize understanding of skin care regimen Date Initiated: 04/15/2015 Goal Status: Active Ulcer/skin breakdown will have a volume reduction of 30% by week 4 Date Initiated: 04/15/2015 Goal Status: Active Ulcer/skin breakdown will have a volume reduction of 50% by week 8 Date Initiated: 04/15/2015 Goal Status: Active Ulcer/skin breakdown will have a volume  reduction of 80% by week 12 Date Initiated: 04/15/2015 Goal Status: Active Ulcer/skin breakdown will heal within 14 weeks Date Initiated: 04/15/2015 Goal Status: Active Interventions: Assess ulceration(s) every visit Notes: Electronic Signature(s) Signed: 04/22/2015 4:23:47 PM By: Rebecca Eaton, RN, Sendra Entered By: Rebecca Eaton RN, Sendra on 04/22/2015 08:26:08 [name] ([id number]) -------------------------------------------------------------------------------- Pain Assessment Details Patient Name: [name] 04/22/2015 8:00 Date of Service: AM Medical Record [id number] Number: Patient Account Number: [id number] 1951-05-01 (64 y.o. Treating RN: Macarthur Critchley Date of Birth/Sex: Female) Other Clinician: Primary Care Physician: Ronette Deter Treating Christin Fudge Referring Physician: Ronette Deter Physician/Extender: Suella Grove in Treatment: 1 Active Problems Location of Pain Severity and Description of Pain Patient Has Paino No Site Locations Rate the pain. Current Pain Level: 0 Pain Management and Medication Current Pain Management: Electronic Signature(s) Signed: 04/22/2015 8:05:25 AM By: Regan Lemming BSN, RN Signed: 04/22/2015 4:23:47 PM By: Rebecca Eaton RN, Sendra Entered By: Rebecca Eaton RN, Sendra on 04/22/2015 08:05:25 [name] ([id number]) -------------------------------------------------------------------------------- Patient/Caregiver Education Details Patient Name: KIANNI, SCHNORR 04/22/2015 8:00 Date of Service: AM Medical Record [id number] Number: Patient Account Number: [id number] 02-17-1951 (64 y.o. Treating RN: Macarthur Critchley Date of Birth/Gender: Female) Other Clinician: Primary Care Physician: Ronette Deter Treating Christin Fudge Referring Physician: Ronette Deter Physician/Extender: Suella Grove in Treatment: 1 Education Assessment Education Provided To: Patient Education Topics Provided Smoking and Wound  Healing: Handouts: Smoking and Wound Healing Methods: Explain/Verbal Responses: State content correctly Electronic Signature(s) Signed: 04/22/2015 4:23:47 PM By: Rebecca Eaton, RN, Sendra Entered By: Rebecca Eaton RN, Sendra on 04/22/2015 08:51:05 [name] ([id number]) -------------------------------------------------------------------------------- Wound Assessment Details Patient Name: SHAREESE, KERBO 04/22/2015 8:00 Date of Service: AM Medical Record [id number] Number: Patient Account Number: [id number] 05-07-1951 (64 y.o. Treating RN: Macarthur Critchley Date of Birth/Sex: Female) Other Clinician: Primary Care Physician: Ronette Deter Treating Christin Fudge Referring Physician: Ronette Deter Physician/Extender: Suella Grove in Treatment: 1 Wound Status Wound Number: 1 Primary Lymphedema Etiology: Wound Location: Left Lower Leg - Anterior, Proximal Wound Open Status: Wounding Event: Gradually Appeared Comorbid Sickle Cell Disease, Chronic Date Acquired: 03/18/2015 History: Obstructive Pulmonary Disease Weeks Of Treatment: 1 (COPD), Hypertension, Gout Clustered Wound: No Photos Wound Measurements Length: (cm) 1 Width: (cm) 1.5 Depth: (cm) 0.2 Area: (cm) 1.178 Volume: (cm) 0.236 % Reduction in Area: -15.4% % Reduction in Volume: -15.7% Epithelialization: None Wound Description Full Thickness Without Exposed Foul Odor After Classification: Support Structures Wound Margin: Flat and Intact Exudate Medium Amount: Exudate Type: Purulent Exudate Color: yellow, brown, green Cleansing: No Wound Bed Granulation Amount: None Present (0%) Exposed Structure ICIS, STRATIS ([id number]) Necrotic Amount: Large (67-100%) Fascia Exposed: No Necrotic Quality: Eschar, Adherent Slough Fat Layer Exposed: No Tendon Exposed: No Muscle Exposed: No Joint Exposed: No Bone  Exposed: No Limited to Skin Breakdown Periwound Skin Texture Texture Color No Abnormalities Noted:  No No Abnormalities Noted: No Callus: No Atrophie Blanche: No Crepitus: No Cyanosis: No Excoriation: No Ecchymosis: No Fluctuance: No Erythema: No Friable: No Hemosiderin Staining: No Induration: No Mottled: No Localized Edema: No Pallor: No Rash: No Rubor: No Scarring: No Temperature / Pain Moisture Tenderness on Palpation: Yes No Abnormalities Noted: No Dry / Scaly: No Maceration: No Moist: Yes Wound Preparation Ulcer Cleansing: Rinsed/Irrigated with Saline Topical Anesthetic Applied: Other: lidocaine 4%, Electronic Signature(s) Signed: 04/23/2015 9:09:55 AM By: Regan Lemming BSN, RN Signed: 04/29/2015 4:48:38 PM By: Rebecca Eaton RN, Sendra Previous Signature: 04/22/2015 8:13:36 AM Version By: Regan Lemming BSN, RN Previous Signature: 04/22/2015 4:23:47 PM Version By: Rebecca Eaton RN, Sendra Entered By: Regan Lemming on 04/23/2015 09:09:55 [name] ([id number]) -------------------------------------------------------------------------------- Wound Assessment Details Patient Name: NISHITHA, DUDDY 04/22/2015 8:00 Date of Service: AM Medical Record [id number] Number: Patient Account Number: [id number] 24-Jul-1950 (64 y.o. Treating RN: Macarthur Critchley Date of Birth/Sex: Female) Other Clinician: Primary Care Physician: Ronette Deter Treating Christin Fudge Referring Physician: Ronette Deter Physician/Extender: Suella Grove in Treatment: 1 Wound Status Wound Number: 2 Primary Lymphedema Etiology: Wound Location: Left Lower Leg - Anterior, Distal Wound Open Status: Wounding Event: Gradually Appeared Comorbid Sickle Cell Disease, Chronic Date Acquired: 03/18/2015 History: Obstructive Pulmonary Disease Weeks Of Treatment: 1 (COPD), Hypertension, Gout Clustered Wound: No Photos Wound Measurements Length: (cm) 0.5 Width: (cm) 0.4 Depth: (cm) 0.1 Area: (cm) 0.157 Volume: (cm) 0.016 % Reduction in Area: 16.5% % Reduction in Volume: 15.8% Epithelialization:  None Wound Description Full Thickness Without Exposed Foul Odor After Classification: Support Structures Wound Margin: Flat and Intact Exudate Small Amount: Exudate Type: Purulent Exudate Color: yellow, brown, green Cleansing: No Wound Bed Granulation Amount: None Present (0%) Exposed Structure MALAJA, WILUSZ ([id number]) Necrotic Amount: Large (67-100%) Fascia Exposed: No Necrotic Quality: Eschar, Adherent Slough Fat Layer Exposed: No Tendon Exposed: No Muscle Exposed: No Joint Exposed: No Bone Exposed: No Limited to Skin Breakdown Periwound Skin Texture Texture Color No Abnormalities Noted: No No Abnormalities Noted: No Callus: No Atrophie Blanche: No Crepitus: No Cyanosis: No Excoriation: No Ecchymosis: No Fluctuance: No Erythema: No Friable: No Hemosiderin Staining: No Induration: No Mottled: No Localized Edema: No Pallor: No Rash: No Rubor: No Scarring: No Moisture No Abnormalities Noted: No Dry / Scaly: No Maceration: No Moist: No Wound Preparation Ulcer Cleansing: Rinsed/Irrigated with Saline Topical Anesthetic Applied: Other: lidocaine 4%, Electronic Signature(s) Signed: 04/23/2015 9:12:30 AM By: Regan Lemming BSN, RN Signed: 04/29/2015 4:48:38 PM By: Rebecca Eaton RN, Sendra Previous Signature: 04/22/2015 8:13:49 AM Version By: Regan Lemming BSN, RN Previous Signature: 04/22/2015 4:23:47 PM Version By: Rebecca Eaton RN, Sendra Entered By: Regan Lemming on 04/23/2015 09:12:30 [name] ([id number]) -------------------------------------------------------------------------------- Wound Assessment Details Patient Name: JOANMARIE, SIDDIQ 04/22/2015 8:00 Date of Service: AM Medical Record [id number] Number: Patient Account Number: [id number] 1951-04-05 (64 y.o. Treating RN: Macarthur Critchley Date of Birth/Sex: Female) Other Clinician: Primary Care Physician: Ronette Deter Treating Christin Fudge Referring Physician: Ronette Deter Physician/Extender: Suella Grove in Treatment: 1 Wound Status Wound Number: 3 Primary Skin Tear Etiology: Wound Location: Left Lower Leg - Medial Wound Open Wounding Event: Shear/Friction Status: Date Acquired: 04/22/2015 Comorbid Sickle Cell Disease, Chronic Weeks Of Treatment: 0 History: Obstructive Pulmonary Disease Clustered Wound: No (COPD), Hypertension, Gout Photos Wound Measurements Length: (cm) 0.2 % Reduction in Area Width: (cm) 0.4 % Reduction in Volu Depth: (cm) 0.1 Epithelialization: Area: (cm) 0.063 Tunneling: Volume: (cm) 0.006 Undermining: :  0% me: 0% None No No Wound Description Classification: Partial Thickness Foul Odor After Cle Wound Margin: Flat and Intact Exudate Amount: Small Exudate Type: Sanguinous Exudate Color: red ansing: No Wound Bed Granulation Amount: None Present (0%) Exposed Structure Necrotic Amount: Small (1-33%) Fascia Exposed: No Necrotic Quality: Adherent Slough Fat Layer Exposed: No CRYSTEN, OPPENHEIM ([id number]) Tendon Exposed: No Muscle Exposed: No Joint Exposed: No Bone Exposed: No Limited to Skin Breakdown Periwound Skin Texture Texture Color No Abnormalities Noted: No No Abnormalities Noted: No Callus: No Atrophie Blanche: No Crepitus: No Cyanosis: No Excoriation: No Ecchymosis: No Fluctuance: No Erythema: No Friable: No Hemosiderin Staining: No Induration: No Mottled: No Localized Edema: No Pallor: No Rash: No Rubor: No Scarring: No Temperature / Pain Moisture Temperature: No Abnormality No Abnormalities Noted: No Tenderness on Palpation: Yes Dry / Scaly: No Maceration: No Moist: No Wound Preparation Ulcer Cleansing: Rinsed/Irrigated with Saline Topical Anesthetic Applied: None Electronic Signature(s) Signed: 04/23/2015 9:13:38 AM By: Regan Lemming BSN, RN Signed: 04/29/2015 4:48:38 PM By: Rebecca Eaton RN, Sendra Previous Signature: 04/22/2015 4:23:47 PM Version By: Rebecca Eaton RN,  Sendra Entered By: Regan Lemming on 04/23/2015 09:13:38 [name] ([id number]) -------------------------------------------------------------------------------- Vitals Details Patient Name: [name]. 04/22/2015 8:00 Date of Service: AM Medical Record [id number] Number: Patient Account Number: [id number] 1951-02-08 (64 y.o. Treating RN: Macarthur Critchley Date of Birth/Sex: Female) Other Clinician: Primary Care Physician: Ronette Deter Treating Christin Fudge Referring Physician: Ronette Deter Physician/Extender: Suella Grove in Treatment: 1 Vital Signs Time Taken: 08:03 Temperature (F): 98.3 Height (in): 66 Pulse (bpm): 44 Weight (lbs): 198 Respiratory Rate (breaths/min): 16 Body Mass Index (BMI): 32 Blood Pressure (mmHg): 152/79 Reference Range: 80 - 120 mg / dl Pulse Oximetry (%): 91 Electronic Signature(s) Signed: 04/22/2015 8:04:55 AM By: Regan Lemming BSN, RN Signed: 04/22/2015 4:23:47 PM By: Rebecca Eaton RN, Sendra Entered By: Rebecca Eaton RN, Sendra on 04/22/2015 08:04:55

## 2015-04-23 NOTE — Progress Notes (Addendum)
Jane Short, Jane Short ([id number]) Visit Report for 04/22/2015 Chief Complaint Document Details Patient Name: Jane Short, Jane Short 04/22/2015 8:00 Date of Service: AM Medical Record [id number] Number: Patient Account Number: [id number] [birth year] (64 y.o. Treating RN: Macarthur Critchley Date of Birth/Sex: Female) Other Clinician: Primary Care Physician: Ronette Deter Treating Christin Fudge Referring Physician: Ronette Deter Physician/Extender: Suella Grove in Treatment: 1 Information Obtained from: Patient Chief Complaint Patient presents to the wound care center for a consult due non healing wound left lower extremity with draining fluid for about 2 months Electronic Signature(s) Signed: 04/22/2015 8:42:41 AM By: Christin Fudge MD, FACS Entered By: Christin Fudge on 04/22/2015 08:42:41 Jane Short ([id number]) -------------------------------------------------------------------------------- Debridement Details Patient Name: Jane Short. 04/22/2015 8:00 Date of Service: AM Medical Record [id number] Number: Patient Account Number: [id number] Oct 21, 1950 (64 y.o. Treating RN: Macarthur Critchley Date of Birth/Sex: Female) Other Clinician: Primary Care Physician: Ronette Deter Treating Christin Fudge Referring Physician: Ronette Deter Physician/Extender: Suella Grove in Treatment: 1 Debridement Performed for Wound #1 Left,Proximal,Anterior Lower Leg Assessment: Performed By: Physician Christin Fudge, MD Debridement: Debridement Pre-procedure Yes Verification/Time Out Taken: Start Time: 08:32 Pain Control: Lidocaine 4% Topical Solution Level: Skin/Subcutaneous Tissue Total Area Debrided (L x 1 (cm) x 1.5 (cm) = 1.5 (cm) W): Tissue and other Viable, Non-Viable, Fibrin/Slough, Subcutaneous material debrided: Instrument: Curette Bleeding: Minimum Hemostasis Achieved: Pressure End Time: 08:34 Procedural Pain: 0 Post Procedural Pain: 0 Post Debridement Measurements of Total  Wound Length: (cm) 1 Width: (cm) 1.5 Depth: (cm) 0.2 Volume: (cm) 0.236 Post Procedure Diagnosis Same as Pre-procedure Electronic Signature(s) Signed: 04/22/2015 8:42:21 AM By: Christin Fudge MD, FACS Signed: 04/22/2015 4:23:47 PM By: Rebecca Eaton RN, Sendra Previous Signature: 04/22/2015 8:34:02 AM Version By: Regan Lemming BSN, RN Entered By: Christin Fudge on 04/22/2015 08:42:21 Jane Short ([id number]) -------------------------------------------------------------------------------- Debridement Details Patient Name: Jane Short, Jane Short 04/22/2015 8:00 Date of Service: AM Medical Record [id number] Number: Patient Account Number: [id number] 1951-05-16 (64 y.o. Treating RN: Macarthur Critchley Date of Birth/Sex: Female) Other Clinician: Primary Care Physician: Ronette Deter Treating Christin Fudge Referring Physician: Ronette Deter Physician/Extender: Suella Grove in Treatment: 1 Debridement Performed for Wound #2 Left,Distal,Anterior Lower Leg Assessment: Performed By: Physician Christin Fudge, MD Debridement: Debridement Pre-procedure Yes Verification/Time Out Taken: Start Time: 08:32 Pain Control: Lidocaine 4% Topical Solution Level: Skin/Subcutaneous Tissue Total Area Debrided (L x 0.5 (cm) x 0.4 (cm) = 0.2 (cm) W): Tissue and other Viable, Fibrin/Slough, Subcutaneous material debrided: Instrument: Curette Bleeding: Minimum Hemostasis Achieved: Pressure End Time: 08:34 Procedural Pain: 0 Post Procedural Pain: 0 Post Debridement Measurements of Total Wound Length: (cm) 0.6 Width: (cm) 0.5 Depth: (cm) 0.1 Volume: (cm) 0.024 Post Procedure Diagnosis Same as Pre-procedure Electronic Signature(s) Signed: 04/22/2015 8:42:35 AM By: Christin Fudge MD, FACS Signed: 04/22/2015 4:23:47 PM By: Rebecca Eaton RN, Sendra Previous Signature: 04/22/2015 8:35:05 AM Version By: Regan Lemming BSN, RN Entered By: Christin Fudge on 04/22/2015 08:42:35 Jane Short  ([id number]) -------------------------------------------------------------------------------- HPI Details Patient Name: Jane Short, Jane Short 04/22/2015 8:00 Date of Service: AM Medical Record [id number] Number: Patient Account Number: [id number] 1951/04/30 (64 y.o. Treating RN: Macarthur Critchley Date of Birth/Sex: Female) Other Clinician: Primary Care Physician: Ronette Deter Treating Christin Fudge Referring Physician: Ronette Deter Physician/Extender: Suella Grove in Treatment: 1 History of Present Illness Location: has had 2 open wounds on the left lower extremity with swelling of her legs Quality: Patient reports experiencing a dull pain to affected area(s). Severity: Patient states wound are getting worse. Duration: Patient has had the wound for > 2 months prior to seeking treatment at  the wound center Timing: Pain in wound is constant (hurts all the time) Context: The wound appeared gradually over time. 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 Modifying Factors: Other treatment(s) tried include:Silvadene ointment Associated Signs and Symptoms: Patient reports having increase swelling. 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 Past medical history significant for sickle cell anemia, vitamin D deficiency, hypertension, osteoporosis, atrial myxoma, gout, lichen planus and status post tubal ligation and gallbladder surgery. She is a smoker and smokes about 10 cigarettes a day. 04/22/2015 -- while removing the tape she has had another ulceration possibly due to a tape burn and this is inferior medial to the previous 2 ulcerations. She is still working on giving up smoking. She informs me that she will be  moving back to [place] early next year and hence will not see a dermatologist here and is awaiting her physicians back in [place]. Electronic Signature(s) Signed: 04/22/2015 8:44:20 AM By: Christin Fudge MD, FACS Previous Signature: 04/22/2015 8:42:51 AM Version By: Christin Fudge MD, FACS Entered By: Christin Fudge on 04/22/2015 08:44:20 Jane Short ([id number]) -------------------------------------------------------------------------------- Physical Exam Details Patient Name: Jane Short, Jane Short 04/22/2015 8:00 Date of Service: AM Medical Record [id number] Number: Patient Account Number: [id number] 04-18-1951 (64 y.o. Treating RN: Macarthur Critchley Date of Birth/Sex: Female) Other Clinician: Primary Care Physician: Ronette Deter Treating Christin Fudge Referring Physician: Ronette Deter Physician/Extender: Suella Grove in Treatment: 1 Constitutional . Pulse regular. Respirations normal and unlabored. Afebrile. . Eyes Nonicteric. Reactive to light. Ears, Nose, Mouth, and Throat Lips, teeth, and gums WNL.Marland Kitchen Moist mucosa without lesions . Neck supple and nontender. No palpable supraclavicular or cervical adenopathy. Normal sized without goiter. Respiratory WNL. No retractions.. Breath sounds WNL, No rubs, rales, rhonchi, or wheeze.. Cardiovascular Heart rhythm and rate regular, no murmur or gallop.. Pedal Pulses WNL. No clubbing, cyanosis or edema. Lymphatic No adneopathy. No adenopathy. No adenopathy. Musculoskeletal Adexa without tenderness or enlargement.. Digits and nails w/o clubbing, cyanosis, infection, petechiae, ischemia, or inflammatory conditions.. Integumentary (Hair, Skin) No suspicious lesions. No crepitus or fluctuance. No peri-wound warmth or erythema. No masses.Marland Kitchen Psychiatric Judgement and insight Intact.. No evidence of depression, anxiety, or agitation.. Notes the 2 ulcerations have some slough and will need sharp debridement with a curette. There is another  small satellite lesion due to the tape which caused a superficial ulceration and we will address this to. Electronic Signature(s) Signed: 04/22/2015 8:43:24 AM By: Christin Fudge MD, FACS Entered By: Christin Fudge on 04/22/2015 08:43:24 Jane Short ([id number]) -------------------------------------------------------------------------------- Physician Orders Details Patient Name: Jane Short, Jane Short 04/22/2015 8:00 Date of Service: AM Medical Record [id number] Number: Patient Account Number: [id number] 07/14/1950 (64 y.o. Treating RN: Macarthur Critchley Date of Birth/Sex: Female) Other Clinician: Primary Care Physician: Ronette Deter Treating Christin Fudge Referring Physician: Ronette Deter Physician/Extender: Suella Grove in Treatment: 1 Verbal / Phone Orders: Yes Clinician: Macarthur Critchley Read Back and Verified: No Diagnosis Coding ICD-10 Coding Code Description I89.0 Lymphedema, not elsewhere classified 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 with fat layer exposed [id number] Lichen planus, unspecified D57.1 Sickle-cell disease without crisis N18.9 Chronic kidney disease, unspecified F17.218 Nicotine dependence, cigarettes, with other nicotine-induced disorders Wound Cleansing Wound #1 Left,Proximal,Anterior Lower Leg o Clean wound  with Normal Saline. Wound #2 Left,Distal,Anterior Lower Leg o Clean wound with Normal Saline. Anesthetic Wound #1 Left,Proximal,Anterior Lower Leg o Topical Lidocaine 4% cream applied to wound bed prior to debridement Wound #2 Left,Distal,Anterior Lower Leg o Topical Lidocaine 4% cream applied to wound bed prior to debridement Primary Wound Dressing Wound #1 Left,Proximal,Anterior Lower Leg o Medihoney gel Wound #2 Left,Distal,Anterior Lower Leg o Medihoney gel Secondary Dressing Wound #1 Left,Proximal,Anterior Lower Leg o Boardered Foam Dressing Jane Short, Jane Short ([id number]) Wound #2 Left,Distal,Anterior Lower  Leg o Boardered Foam Dressing Wound #3 Left,Medial Lower Leg o Boardered Foam Dressing - band-aid Dressing Change Frequency Wound #1 Left,Proximal,Anterior Lower Leg o Change dressing every day. Wound #2 Left,Distal,Anterior Lower Leg o Change dressing every day. Wound #3 Left,Medial Lower Leg o Change dressing every day. Follow-up Appointments Wound #1 Left,Proximal,Anterior Lower Leg o Return Appointment in 1 week. Wound #2 Left,Distal,Anterior Lower Leg o Return Appointment in 1 week. Wound #3 Left,Medial Lower Leg o Return Appointment in 1 week. Edema Control Wound #1 Left,Proximal,Anterior Lower Leg o Patient to wear own compression stockings o Elevate legs to the level of the heart and pump ankles as often as possible Wound #2 Left,Distal,Anterior Lower Leg o Patient to wear own compression stockings o Elevate legs to the level of the heart and pump ankles as often as possible Additional Orders / Instructions Wound #1 Left,Proximal,Anterior Lower Leg o Stop Smoking Wound #2 Left,Distal,Anterior Lower Leg o Stop Smoking Wound #3 Left,Medial Lower Leg o Stop Smoking Jane Short, Jane Short ([id number]) Electronic Signature(s) Signed: 04/22/2015 8:49:57 AM By: Ardean Larsen Signed: 04/22/2015 4:11:13 PM By: Christin Fudge MD, FACS Entered By: Rebecca Eaton RN, Sendra on 04/22/2015 08:49:57 Jane Short ([id number]) -------------------------------------------------------------------------------- Problem List Details Patient Name: Jane Short, Jane Short 04/22/2015 8:00 Date of Service: AM Medical Record [id number] Number: Patient Account Number: [id number] [birth year] (64 y.o. Treating RN: Macarthur Critchley Date of Birth/Sex: Female) Other Clinician: Primary Care Physician: Ronette Deter Treating Christin Fudge Referring Physician: Ronette Deter Physician/Extender: Suella Grove in Treatment: 1 Active Problems ICD-10 Encounter Code  Description Active Date Diagnosis I89.0 Lymphedema, not elsewhere classified 04/15/2015 Yes 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 with fat layer 04/15/2015 Yes exposed [id number] Lichen planus, unspecified 04/15/2015 Yes D57.1 Sickle-cell disease without crisis 04/15/2015 Yes N18.9 Chronic kidney disease, unspecified 04/15/2015 Yes F17.218 Nicotine dependence, cigarettes, 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/15/2015 Yes induced disorders Inactive Problems Resolved Problems Electronic Signature(s) Signed: 04/22/2015 8:42:03 AM By: Christin Fudge MD, FACS Entered By: Christin Fudge on 04/22/2015 08:42:03 Jane Short ([id number]) -------------------------------------------------------------------------------- Progress Note Details Patient Name: Jane Short 04/22/2015 8:00 Date of Service: AM Medical Record [id number] Number: Patient Account Number: [id number] 1951-02-25 (64 y.o. Treating RN: Macarthur Critchley Date of Birth/Sex: Female) Other Clinician: Primary Care Physician: Ronette Deter Treating Christin Fudge Referring Physician: Ronette Deter Physician/Extender: Suella Grove in Treatment: 1 Subjective Chief Complaint Information obtained from Patient Patient presents to the wound care center for a consult due non healing wound left lower extremity with draining fluid for about 2 months History of Present Illness (HPI) The following HPI elements were documented for the patient's wound: Location: has had 2 open wounds on the left lower extremity with swelling of her legs Quality: Patient reports experiencing a dull pain to affected area(s). Severity: Patient states wound are getting worse. Duration: Patient has had the wound for > 2 months prior to seeking treatment at the wound center Timing: Pain in wound is constant (hurts all the time) Context: The wound appeared gradually over time.  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 Modifying Factors: Other treatment(s) tried include:Silvadene ointment Associated Signs and Symptoms: Patient reports having increase swelling. 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 Past medical history significant for sickle cell anemia, vitamin D deficiency, hypertension, osteoporosis, atrial myxoma, gout, lichen planus and status post tubal ligation and gallbladder surgery. She is a smoker and smokes about 10 cigarettes a day. 04/22/2015 -- while removing the tape she has had another ulceration possibly due to a tape burn and this is inferior medial to the previous 2 ulcerations. She is still working on giving up smoking. She informs me that she will be moving back to [place] early next year and hence will not see a dermatologist here and is awaiting her physicians back in [place]. Objective Constitutional Jane Short, Jane Short ([id number]) Pulse regular. Respirations normal and unlabored. Afebrile. Vitals Time Taken: 8:03 AM, Height: 66 in, Weight: 198 lbs, BMI: 32, Temperature: 98.3 F, Pulse: 44 bpm, Respiratory Rate: 16 breaths/min, Blood Pressure: 152/79 mmHg, Pulse Oximetry: 91 %. Eyes Nonicteric. Reactive to light. Ears, Nose, Mouth, and Throat Lips, teeth, and gums WNL.Marland Kitchen Moist mucosa without lesions . Neck supple and nontender. No palpable supraclavicular or cervical adenopathy. Normal sized without goiter. Respiratory WNL. No retractions.. Breath sounds WNL, No rubs, rales, rhonchi, or wheeze.. Cardiovascular Heart rhythm and rate regular, no murmur or gallop.. Pedal Pulses WNL. No clubbing, cyanosis or edema. Lymphatic No adneopathy. No adenopathy. No adenopathy. Musculoskeletal Adexa without tenderness or enlargement.. Digits and nails  w/o clubbing, cyanosis, infection, petechiae, ischemia, or inflammatory conditions.Marland Kitchen Psychiatric Judgement and insight Intact.. No evidence of depression, anxiety, or agitation.. General Notes: the 2 ulcerations have some slough and will need sharp debridement with a curette. There is another small satellite lesion due to the tape which caused a superficial ulceration and we will address this to. Integumentary (Hair, Skin) No suspicious lesions. No crepitus or fluctuance. No peri-wound warmth or erythema. No masses.. Wound #1 status is Open. Original cause of wound was Gradually Appeared. The wound is located on the Left,Proximal,Anterior Lower Leg. The wound measures 1cm length x 1.5cm width x 0.2cm depth; 1.178cm^2 area and 0.236cm^3 volume. The wound is limited to skin breakdown. There is a medium amount of purulent drainage noted. The wound margin is flat and intact. There is no granulation within the wound bed. There is a large (67-100%) amount of necrotic tissue within the wound bed including Eschar and Adherent Slough. The periwound skin appearance exhibited: Moist. The periwound skin appearance did not exhibit: Callus, Crepitus, Excoriation, Fluctuance, Friable, Induration, Localized Edema, Rash, Scarring, Dry/Scaly, Maceration, Atrophie Blanche, Cyanosis, Ecchymosis, Hemosiderin Staining, Mottled, Pallor, Rubor, Erythema. The periwound has tenderness on palpation. Wound #2 status is Open. Original cause of wound was Gradually Appeared. The wound is located on the [facility] Lower Leg. The wound measures 0.5cm length x 0.4cm width x 0.1cm depth; COLIN, PUCK C. ([id number]) 0.157cm^2 area and 0.016cm^3 volume. The wound is limited to skin breakdown. There is a small amount of purulent drainage noted. The wound margin is flat and intact. There is no granulation within the wound bed. There is a large (67-100%) amount of necrotic tissue within the wound bed including Eschar  and Adherent Slough. The periwound skin appearance did not exhibit: Callus, Crepitus, Excoriation, Fluctuance, Friable, Induration, Localized Edema, Rash, Scarring, Dry/Scaly, Maceration, Moist, Atrophie Blanche, Cyanosis, Ecchymosis, Hemosiderin Staining, Mottled, Pallor, Rubor, Erythema. Wound #3 status is Open. Original cause of wound was Shear/Friction. The wound is located on the Left,Medial Lower Leg. The wound measures 0.2cm length x 0.4cm width x 0.1cm depth; 0.063cm^2 area and 0.006cm^3 volume. The wound is limited to skin breakdown. There is no tunneling or undermining noted. There is a small amount of sanguinous drainage noted. The wound margin is flat and intact. There is no granulation within the wound bed. There is a small (1-33%) amount of necrotic tissue within the wound bed including Adherent Slough. The periwound skin appearance did not exhibit: Callus, Crepitus, Excoriation, Fluctuance, Friable, Induration, Localized Edema, Rash, Scarring, Dry/Scaly, Maceration, Moist, Atrophie Blanche, Cyanosis, Ecchymosis, Hemosiderin Staining, Mottled, Pallor, Rubor, Erythema. Periwound temperature was noted as No Abnormality. The periwound has tenderness on palpation. Assessment Active Problems ICD-10 I89.0 - Lymphedema, not elsewhere classified L97.222 - Non-pressure chronic ulcer of left calf with fat layer exposed [id number] - Lichen planus, unspecified D57.1 - Sickle-cell disease without crisis N18.9 - Chronic kidney disease, unspecified F17.218 - Nicotine dependence, cigarettes, with other nicotine-induced disorders I have recommended Medihoney to be applied daily with a bordered foam dressing and for her to use compression stockings of the 20-30 mm variety. I have also recommended elevation and exercise and continue to work with her nephrologist regarding her edema. We have also addressed being careful while removing the tape as she should not get any further tape burns. She is also  is to see a dermatologist where she lived in [place]. We have spent some time discussing her smoking addiction and have discussed the risks benefits and alternatives to smoking cessation and have strongly advised her to completely give up smoking. She says she is currently compliant. Jane Short, CLEVINGER ([id number]) She has been given appointments to come and see as every week. Procedures Wound #1 Wound #1 is a Lymphedema located on the Left,Proximal,Anterior Lower Leg . There was a Skin/Subcutaneous Tissue Debridement [id number] debridement with total area of 1.5 sq cm performed by Christin Fudge, MD. with the following instrument(s): Curette to remove Viable and Non-Viable tissue/material including Fibrin/Slough and Subcutaneous after achieving pain control using Lidocaine 4% Topical Solution. A time out was conducted prior to the start of the procedure. A Minimum amount of bleeding was controlled with Pressure. The patient tolerated the procedure with a pain level of 0 throughout and a pain level of 0 following the procedure. Post Debridement Measurements: 1cm length x 1.5cm width x 0.2cm depth; 0.236cm^3 volume. Post procedure Diagnosis Wound #1: Same as Pre-Procedure Wound #2 Wound #2 is a Lymphedema located on the Left,Distal,Anterior Lower Leg . There was a Skin/Subcutaneous Tissue Debridement [id number]) debridement with total area of 0.2 sq cm performed by Christin Fudge, MD. with the following instrument(s): Curette to remove Viable tissue/material including Fibrin/Slough and Subcutaneous after achieving pain control using Lidocaine 4% Topical Solution. A time out was conducted prior to the start of the procedure. A Minimum amount of bleeding was controlled with Pressure. The patient tolerated the procedure with a pain level of 0 throughout and a pain level of 0 following the procedure. Post Debridement Measurements: 0.6cm length x 0.5cm width x 0.1cm depth; 0.024cm^3  volume. Post procedure Diagnosis Wound #2: Same as Pre-Procedure Plan Wound Cleansing: Wound #1 Left,Proximal,Anterior Lower Leg: Clean wound with Normal Saline. Wound #2 Left,Distal,Anterior Lower  Leg: Clean wound with Normal Saline. Anesthetic: Wound #1 Left,Proximal,Anterior Lower Leg: Topical Lidocaine 4% cream applied to wound bed prior to debridement Wound #2 Left,Distal,Anterior Lower Leg: Topical Lidocaine 4% cream applied to wound bed prior to debridement Primary Wound Dressing: Wound #1 Left,Proximal,Anterior Lower Leg: Medihoney gel EMMANUELLE, HAMANN ([id number]) Wound #2 Left,Distal,Anterior Lower Leg: Medihoney gel Secondary Dressing: Wound #1 Left,Proximal,Anterior Lower Leg: Boardered Foam Dressing Wound #2 Left,Distal,Anterior Lower Leg: Boardered Foam Dressing Wound #3 Left,Medial Lower Leg: Boardered Foam Dressing - band-aid Dressing Change Frequency: Wound #1 Left,Proximal,Anterior Lower Leg: Change dressing every day. Wound #2 Left,Distal,Anterior Lower Leg: Change dressing every day. Wound #3 Left,Medial Lower Leg: Change dressing every day. Follow-up Appointments: Wound #1 Left,Proximal,Anterior Lower Leg: Return Appointment in 1 week. Wound #2 Left,Distal,Anterior Lower Leg: Return Appointment in 1 week. Wound #3 Left,Medial Lower Leg: Return Appointment in 1 week. Edema Control: Wound #1 Left,Proximal,Anterior Lower Leg: Patient to wear own compression stockings Elevate legs to the level of the heart and pump ankles as often as possible Wound #2 Left,Distal,Anterior Lower Leg: Patient to wear own compression stockings Elevate legs to the level of the heart and pump ankles as often as possible Additional Orders / Instructions: Wound #1 Left,Proximal,Anterior Lower Leg: Stop Smoking Wound #2 Left,Distal,Anterior Lower Leg: Stop Smoking Wound #3 Left,Medial Lower Leg: Stop Smoking I have recommended Medihoney to be applied daily with a  bordered foam dressing and for her to use compression stockings of the 20-30 mm variety. I have also recommended elevation and exercise and continue to work with her nephrologist regarding her edema. We have also addressed being careful while removing the tape as she should not get any further tape burns. LUCERO, SANTOPIETRO ([id number]) She is also is to see a dermatologist where she lived in [place]. We have spent some time discussing her smoking addiction and have discussed the risks benefits and alternatives to smoking cessation and have strongly advised her to completely give up smoking. She says she is currently compliant. She has been given appointments to come and see as every week. Electronic Signature(s) Signed: 04/23/2015 4:51:00 PM By: Christin Fudge MD, FACS Previous Signature: 04/22/2015 4:12:58 PM Version By: Christin Fudge MD, FACS Previous Signature: 04/22/2015 8:45:37 AM Version By: Christin Fudge MD, FACS Entered By: Christin Fudge on 04/23/2015 16:51:00 Jane Short ([id number]) -------------------------------------------------------------------------------- SuperBill Details Patient Name: Jane Short Date of Service: 04/22/2015 Medical Record Number: [id number] Patient Account Number: [id number] Date of Birth/Sex: Sep 04, 1950 (64 y.o. 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 dependence, cigarettes, with other nicotine-induced disorders Facility Procedures CPT4 Code: [id number] Description: [id number] - DEB SUBQ TISSUE  20 SQ CM/< ICD-10 Description Diagnosis I89.0 Lymphedema, not elsewhere classified 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 with fat l 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 ([id number]) Modifier: ayer exposed Quantity: 1 Electronic Signature(s) Signed: 04/22/2015 8:46:06 AM By: Christin Fudge MD, FACS Entered By: Christin Fudge on 04/22/2015 08:46:06

## 2015-04-24 ENCOUNTER — Inpatient Hospital Stay: Payer: BLUE CROSS/BLUE SHIELD

## 2015-04-24 VITALS — BP 120/73 | HR 96 | Temp 96.5°F | Resp 20

## 2015-04-24 DIAGNOSIS — D631 Anemia in chronic kidney disease: Secondary | ICD-10-CM

## 2015-04-24 DIAGNOSIS — N189 Chronic kidney disease, unspecified: Principal | ICD-10-CM

## 2015-04-24 DIAGNOSIS — I129 Hypertensive chronic kidney disease with stage 1 through stage 4 chronic kidney disease, or unspecified chronic kidney disease: Secondary | ICD-10-CM | POA: Diagnosis not present

## 2015-04-24 LAB — HEMOGLOBIN: Hemoglobin: 7 g/dL — ABNORMAL LOW (ref 12.0–16.0)

## 2015-04-24 MED ORDER — EPOETIN ALFA 40000 UNIT/ML IJ SOLN
40000.0000 [IU] | Freq: Once | INTRAMUSCULAR | Status: AC
  Administered 2015-04-24: 40000 [IU] via SUBCUTANEOUS
  Filled 2015-04-24: qty 1

## 2015-04-29 ENCOUNTER — Other Ambulatory Visit: Payer: Self-pay | Admitting: Family Medicine

## 2015-04-29 ENCOUNTER — Encounter: Payer: BLUE CROSS/BLUE SHIELD | Admitting: Surgery

## 2015-04-29 DIAGNOSIS — L97222 Non-pressure chronic ulcer of left calf with fat layer exposed: Secondary | ICD-10-CM | POA: Diagnosis not present

## 2015-04-29 NOTE — Progress Notes (Addendum)
Jane, Short ([id number]) Visit Report for 04/29/2015 Arrival Information Details Patient Name: Jane Short, Jane Short. Date of Service: 04/29/2015 8:00 AM Medical Record Number: [id number] Patient Account Number: [id number] Date of Birth/Sex: 17-Nov-1950 (64 y.o. Female) Treating RN: Macarthur Critchley Primary Care Physician: Ronette Deter Other Clinician: Referring Physician: Ronette Deter Treating Physician/Extender: Frann Rider in Treatment: 2 Visit Information History Since Last Visit All ordered tests and consults were completed: No Patient Arrived: Ambulatory Added or deleted any medications: No Arrival Time: 08:07 Any new allergies or adverse reactions: No Accompanied By: self Had a fall or experienced change in No Transfer Assistance: None activities of daily living that may affect Patient Identification Verified: Yes risk of falls: Secondary Verification Process Yes Signs or symptoms of abuse/neglect since last No Completed: visito Patient Requires Transmission-Based No Hospitalized since last visit: No Precautions: Has Dressing in Place as Prescribed: Yes Patient Has Alerts: No Pain Present Now: No Electronic Signature(s) Signed: 04/29/2015 8:08:28 AM By: Rebecca Eaton RN, Sendra Entered By: Rebecca Eaton RN, Sendra on 04/29/2015 [id number] Jane Short ([id number]) -------------------------------------------------------------------------------- Encounter Discharge Information Details Patient Name: Jane Short Date of Service: 04/29/2015 8:00 AM Medical Record Number: [id number] Patient Account Number: [id number] Date of Birth/Sex: 02-22-1951 (64 y.o. Female) Treating RN: Macarthur Critchley Primary Care Physician: Ronette Deter Other Clinician: Referring Physician: Ronette Deter Treating Physician/Extender: Frann Rider in Treatment: 2 Encounter Discharge Information Items Discharge Pain Level: 0 Discharge Condition: Stable Ambulatory  Status: Ambulatory Discharge Destination: Home Transportation: Private Auto Accompanied By: self Schedule Follow-up Appointment: Yes Medication Reconciliation completed and provided to Patient/Care Yes Isaid Salvia: Provided on Clinical Summary of Care: 04/29/2015 Form Type Recipient Paper Patient JW Electronic Signature(s) Signed: 04/29/2015 4:48:38 PM By: Rebecca Eaton RN, Sendra Previous Signature: 04/29/2015 8:40:45 AM Version By: Ruthine Dose Entered By: Rebecca Eaton RN, Sendra on 04/29/2015 08:42:01 Jane Short ([id number]) -------------------------------------------------------------------------------- Lower Extremity Assessment Details Patient Name: Jane Short Date of Service: 04/29/2015 8:00 AM Medical Record Number: [id number] Patient Account Number: [id number] Date of Birth/Sex: 1950-12-24 (64 y.o. Female) Treating RN: Macarthur Critchley Primary Care Physician: Ronette Deter Other Clinician: Referring Physician: Ronette Deter Treating Physician/Extender: Frann Rider in Treatment: 2 Edema Assessment Assessed: [Left: No] [Right: No] E[Left: dema] [Right: :] Calf Left: Right: Point of Measurement: 32 cm From Medial Instep 39.2 cm cm Ankle Left: Right: Point of Measurement: 10 cm From Medial Instep 19.3 cm cm Vascular Assessment Pulses: Posterior Tibial Dorsalis Pedis Palpable: [Left:Yes] Extremity colors, hair growth, and conditions: Hair Growth on Extremity: [Left:No] Capillary Refill: [Left:< 3 seconds] Dependent Rubor: [Left:No] Blanched when Elevated: [Left:No] Lipodermatosclerosis: [Left:No] Toe Nail Assessment Left: Right: Thick: No Discolored: No Deformed: No Improper Length and Hygiene: No Electronic Signature(s) Signed: 04/29/2015 4:48:38 PM By: Rebecca Eaton, RN, Sendra Entered By: Rebecca Eaton RN, Sendra on 04/29/2015 08:15:18 Jane Short ([id number], MANFRED  ([id number]) -------------------------------------------------------------------------------- Multi Wound Chart Details Patient Name: Jane Short Date of Service: 04/29/2015 8:00 AM Medical Record Number: [id number] Patient Account Number: [id number] Date of Birth/Sex: 02-Dec-1950 (64 y.o. Female) Treating RN: Macarthur Critchley Primary Care Physician: Ronette Deter Other Clinician: Referring Physician: Ronette Deter Treating Physician/Extender: Frann Rider in Treatment: 2 Vital Signs Height(in): 66 Pulse(bpm): 88 Weight(lbs): 198 Blood Pressure 113/56 (mmHg): Body Mass Index(BMI): 32 Temperature(F): 98.0 Respiratory Rate 16 (breaths/min): Photos: [1:No Photos] [2:No Photos] [3:No Photos] Wound Location: [1:Left Lower Leg - Anterior, Proximal] [2:Left Lower Leg - Anterior, Distal] [3:Left Lower Leg - Medial] Wounding Event: [1:Gradually Appeared] [2:Gradually Appeared] [3:Shear/Friction] Primary Etiology: [1:Lymphedema] [2:Lymphedema] [  3:Skin Tear] Comorbid History: [1:Sickle Cell Disease, Chronic Obstructive Pulmonary Disease (COPD), Hypertension, Gout] [2:Sickle Cell Disease, Chronic Obstructive Pulmonary Disease (COPD), Hypertension, Gout] [3:Sickle Cell Disease, Chronic Obstructive Pulmonary  Disease (COPD), Hypertension, Gout] Date Acquired: [1:03/18/2015] [2:03/18/2015] [3:04/22/2015] Weeks of Treatment: [1:2] [2:2] [3:1] Wound Status: [1:Open] [2:Open] [3:Healed - Epithelialized] Measurements L x W x D 1.1x1.4x0.2 [2:0.5x0.5x0.1] [3:0x0x0] (cm) Area (cm) : [1:1.21] [2:0.196] [3:0] Volume (cm) : [1:0.242] [2:0.02] [3:0] % Reduction in Area: [1:-18.50%] [2:-4.30%] [3:100.00%] % Reduction in Volume: -18.60% [2:-5.30%] [3:100.00%] Classification: [1:Full Thickness Without Exposed Support Structures] [2:Full Thickness Without Exposed Support Structures] [3:Partial Thickness] Exudate Amount: [1:Medium] [2:Small] [3:None Present] Exudate Type: [1:Serous]  [2:Serous] [3:N/A] Exudate Color: [1:amber] [2:amber] [3:N/A] Wound Margin: [1:Flat and Intact] [2:Flat and Intact] [3:Flat and Intact] Granulation Amount: [1:None Present (0%)] [2:None Present (0%)] [3:None Present (0%)] Necrotic Amount: [1:Large (67-100%)] [2:Large (67-100%)] [3:None Present (0%)] Exposed Structures: [1:Fascia: No Fat: No] [2:Fascia: No Fat: No] [3:Fascia: No Fat: No] Tendon: No Tendon: No Tendon: No Muscle: No Muscle: No Muscle: No Joint: No Joint: No Joint: No Bone: No Bone: No Bone: No Limited to Skin Limited to Skin Limited to Skin Breakdown Breakdown Breakdown Epithelialization: None None Large (67-100%) Periwound Skin Texture: Edema: No Edema: No Edema: No Excoriation: No Excoriation: No Excoriation: No Induration: No Induration: No Induration: No Callus: No Callus: No Callus: No Crepitus: No Crepitus: No Crepitus: No Fluctuance: No Fluctuance: No Fluctuance: No Friable: No Friable: No Friable: No Rash: No Rash: No Rash: No Scarring: No Scarring: No Scarring: No Periwound Skin Moist: Yes Maceration: No Maceration: No Moisture: Maceration: No Moist: No Moist: No Dry/Scaly: No Dry/Scaly: No Dry/Scaly: No Periwound Skin Color: Atrophie Blanche: No Atrophie Blanche: No Atrophie Blanche: No Cyanosis: No Cyanosis: No Cyanosis: No Ecchymosis: No Ecchymosis: No Ecchymosis: No Erythema: No Erythema: No Erythema: No Hemosiderin Staining: No Hemosiderin Staining: No Hemosiderin Staining: No Mottled: No Mottled: No Mottled: No Pallor: No Pallor: No Pallor: No Rubor: No Rubor: No Rubor: No Temperature: N/A No Abnormality No Abnormality Tenderness on Yes No No Palpation: Wound Preparation: Ulcer Cleansing: Ulcer Cleansing: Topical Anesthetic Rinsed/Irrigated with Rinsed/Irrigated with Applied: None Saline Saline Topical Anesthetic Topical Anesthetic Applied: Other: lidocaine Applied: Other: lidocaine 4% 4% Treatment  Notes Electronic Signature(s) Signed: 04/29/2015 4:48:38 PM By: Rebecca Eaton RN, Sendra Entered By: Rebecca Eaton RN, Sendra on 04/29/2015 08:22:44 Jane Short ([id number]) -------------------------------------------------------------------------------- [place] Details Patient Name: MICHAL, FIRPO Date of Service: 04/29/2015 8:00 AM Medical Record Number: [id number] Patient Account Number: [id number] Date of Birth/Sex: 1951/03/14 (64 y.o. Female) Treating RN: Macarthur Critchley Primary Care Physician: Ronette Deter Other Clinician: Referring Physician: Ronette Deter Treating Physician/Extender: Frann Rider in Treatment: 2 Active Inactive Abuse / Safety / Falls / Self Care Management Nursing Diagnoses: Impaired physical mobility Potential for falls Goals: Patient will remain injury free Date Initiated: 04/15/2015 Goal Status: Active Interventions: Assess fall risk on admission and as needed Notes: has not had any falls since admission to wound center Orientation to the Wound Care Program Nursing Diagnoses: Knowledge deficit related to the wound healing center program Goals: Patient/caregiver will verbalize understanding of the [facility] Date Initiated: 04/15/2015 Goal Status: Active Interventions: Provide education on orientation to the wound center Notes: Wound/Skin Impairment Nursing Diagnoses: Impaired tissue integrity Goals: KAZLYNN, HIBBERD ([id number]) Patient/caregiver will verbalize understanding of skin care regimen Date Initiated: 04/15/2015 Goal Status: Active Ulcer/skin breakdown will have a volume reduction of 30% by week 4 Date Initiated: 04/15/2015 Goal Status: Active  Ulcer/skin breakdown will have a volume reduction of 50% by week 8 Date Initiated: 04/15/2015 Goal Status: Active Ulcer/skin breakdown will have a volume reduction of 80% by week 12 Date Initiated: 04/15/2015 Goal Status:  Active Ulcer/skin breakdown will heal within 14 weeks Date Initiated: 04/15/2015 Goal Status: Active Interventions: Assess ulceration(s) every visit Notes: Electronic Signature(s) Signed: 04/29/2015 4:48:38 PM By: Rebecca Eaton, RN, Sendra Entered By: Rebecca Eaton RN, Sendra on 04/29/2015 08:18:13 Jane Short ([id number]) -------------------------------------------------------------------------------- Pain Assessment Details Patient Name: Jane Short Date of Service: 04/29/2015 8:00 AM Medical Record Number: [id number] Patient Account Number: [id number] Date of Birth/Sex: 02/22/1951 (64 y.o. Female) Treating RN: Macarthur Critchley Primary Care Physician: Ronette Deter Other Clinician: Referring Physician: Ronette Deter Treating Physician/Extender: Frann Rider in Treatment: 2 Active Problems Location of Pain Severity and Description of Pain Patient Has Paino No Site Locations Rate the pain. Current Pain Level: 0 Pain Management and Medication Current Pain Management: Electronic Signature(s) Signed: 04/29/2015 4:48:38 PM By: Rebecca Eaton RN, Sendra Entered By: Rebecca Eaton RN, Sendra on 04/29/2015 08:08:43 Jane Short ([id number]) -------------------------------------------------------------------------------- Patient/Caregiver Education Details Patient Name: Jane Short Date of Service: 04/29/2015 8:00 AM Medical Record Number: [id number] Patient Account Number: [id number] Date of Birth/Gender: 08/24/1950 (64 y.o. Female) Treating RN: Macarthur Critchley Primary Care Physician: Ronette Deter Other Clinician: Referring Physician: Ronette Deter Treating Physician/Extender: Frann Rider in Treatment: 2 Education Assessment Education Provided To: Patient Education Topics Provided Smoking and Wound Healing: Handouts: Smoking and Wound Healing Responses: State content correctly Electronic Signature(s) Signed: 04/29/2015 4:48:38 PM By:  Rebecca Eaton RN, Sendra Entered By: Rebecca Eaton RN, Sendra on 04/29/2015 08:42:27 Jane Short ([id number]) -------------------------------------------------------------------------------- Wound Assessment Details Patient Name: Jane Short Date of Service: 04/29/2015 8:00 AM Medical Record Number: [id number] Patient Account Number: [id number] Date of Birth/Sex: 06-12-1950 (64 y.o. Female) Treating RN: Macarthur Critchley Primary Care Physician: Ronette Deter Other Clinician: Referring Physician: Ronette Deter Treating Physician/Extender: Frann Rider in Treatment: 2 Wound Status Wound Number: 1 Primary Lymphedema Etiology: Wound Location: Left Lower Leg - Anterior, Proximal Wound Open Status: Wounding Event: Gradually Appeared Comorbid Sickle Cell Disease, Chronic Date Acquired: 03/18/2015 History: Obstructive Pulmonary Disease Weeks Of Treatment: 2 (COPD), Hypertension, Gout Clustered Wound: No Photos Photo Uploaded By: Gretta Cool, RN, BSN, Kim on 04/29/2015 17:13:22 Wound Measurements Length: (cm) 1.1 Width: (cm) 1.4 Depth: (cm) 0.2 Area: (cm) 1.21 Volume: (cm) 0.242 % Reduction in Area: -18.5% % Reduction in Volume: -18.6% Epithelialization: None Tunneling: No Undermining: No Wound Description Full Thickness Without Exposed Classification: Support Structures Wound Margin: Flat and Intact Exudate Medium Amount: Exudate Type: Serous Exudate Color: amber Foul Odor After Cleansing: No Wound Bed Granulation Amount: None Present (0%) Exposed Structure Necrotic Amount: Large (67-100%) Fascia Exposed: No KODIE, PETTREY ([id number]) Necrotic Quality: Adherent Slough Fat Layer Exposed: No Tendon Exposed: No Muscle Exposed: No Joint Exposed: No Bone Exposed: No Limited to Skin Breakdown Periwound Skin Texture Texture Color No Abnormalities Noted: No No Abnormalities Noted: No Callus: No Atrophie Blanche: No Crepitus: No Cyanosis:  No Excoriation: No Ecchymosis: No Fluctuance: No Erythema: No Friable: No Hemosiderin Staining: No Induration: No Mottled: No Localized Edema: No Pallor: No Rash: No Rubor: No Scarring: No Temperature / Pain Moisture Tenderness on Palpation: Yes No Abnormalities Noted: No Dry / Scaly: No Maceration: No Moist: Yes Wound Preparation Ulcer Cleansing: Rinsed/Irrigated with Saline Topical Anesthetic Applied: Other: lidocaine 4%, Treatment Notes Wound #1 (Left, Proximal, Anterior Lower Leg) 1. Cleansed with: Clean wound with Normal Saline 2. Anesthetic Topical Lidocaine  4% cream to wound bed prior to debridement 3. Peri-wound Care: Skin Prep 4. Dressing Applied: Medihoney Gel 5. Secondary Dressing Applied Bordered Foam Dressing 7. Secured with Support Garment 30-40 mm/Hg pressure to: Notes patient to wear personal compression stockings 30-71mmHg ARASELI, BAXTER ([id number]) Electronic Signature(s) Signed: 04/29/2015 4:48:38 PM By: Rebecca Eaton RN, Sendra Entered By: Rebecca Eaton RN, Sendra on 04/29/2015 08:17:17 Jane Short ([id number]) -------------------------------------------------------------------------------- Wound Assessment Details Patient Name: Jane Short Date of Service: 04/29/2015 8:00 AM Medical Record Number: [id number] Patient Account Number: [id number] Date of Birth/Sex: June 28, 1950 (64 y.o. Female) Treating RN: Macarthur Critchley Primary Care Physician: Ronette Deter Other Clinician: Referring Physician: Ronette Deter Treating Physician/Extender: Frann Rider in Treatment: 2 Wound Status Wound Number: 2 Primary Lymphedema Etiology: Wound Location: Left Lower Leg - Anterior, Distal Wound Open Status: Wounding Event: Gradually Appeared Comorbid Sickle Cell Disease, Chronic Date Acquired: 03/18/2015 History: Obstructive Pulmonary Disease Weeks Of Treatment: 2 (COPD), Hypertension, Gout Clustered Wound: No Photos Photo  Uploaded By: Gretta Cool, RN, BSN, Kim on 04/29/2015 17:13:22 Wound Measurements Length: (cm) 0.5 Width: (cm) 0.5 Depth: (cm) 0.1 Area: (cm) 0.196 Volume: (cm) 0.02 % Reduction in Area: -4.3% % Reduction in Volume: -5.3% Epithelialization: None Tunneling: No Undermining: No Wound Description Full Thickness Without Exposed Classification: Support Structures Wound Margin: Flat and Intact Exudate Small Amount: Exudate Type: Serous Exudate Color: amber Foul Odor After Cleansing: No Wound Bed Granulation Amount: None Present (0%) Exposed Structure Necrotic Amount: Large (67-100%) Fascia Exposed: No DENIQUE, EIDEM ([id number]) Necrotic Quality: Adherent Slough Fat Layer Exposed: No Tendon Exposed: No Muscle Exposed: No Joint Exposed: No Bone Exposed: No Limited to Skin Breakdown Periwound Skin Texture Texture Color No Abnormalities Noted: No No Abnormalities Noted: No Callus: No Atrophie Blanche: No Crepitus: No Cyanosis: No Excoriation: No Ecchymosis: No Fluctuance: No Erythema: No Friable: No Hemosiderin Staining: No Induration: No Mottled: No Localized Edema: No Pallor: No Rash: No Rubor: No Scarring: No Temperature / Pain Moisture Temperature: No Abnormality No Abnormalities Noted: No Dry / Scaly: No Maceration: No Moist: No Wound Preparation Ulcer Cleansing: Rinsed/Irrigated with Saline Topical Anesthetic Applied: Other: lidocaine 4%, Treatment Notes Wound #2 (Left, Distal, Anterior Lower Leg) 1. Cleansed with: Clean wound with Normal Saline 2. Anesthetic Topical Lidocaine 4% cream to wound bed prior to debridement 3. Peri-wound Care: Skin Prep 4. Dressing Applied: Medihoney Gel 5. Secondary Dressing Applied Bordered Foam Dressing 7. Secured with Support Garment 30-40 mm/Hg pressure to: Notes patient to wear personal compression stockings 30-19mmHg MARIKO, RUDGE ([id number]) Electronic Signature(s) Signed: 04/29/2015 4:48:38 PM By:  Rebecca Eaton RN, Sendra Entered By: Rebecca Eaton RN, Sendra on 04/29/2015 08:17:40 Jane Short ([id number]) -------------------------------------------------------------------------------- Wound Assessment Details Patient Name: Jane Short Date of Service: 04/29/2015 8:00 AM Medical Record Number: [id number] Patient Account Number: [id number] Date of Birth/Sex: Jan 31, 1951 (64 y.o. Female) Treating RN: Macarthur Critchley Primary Care Physician: Ronette Deter Other Clinician: Referring Physician: Ronette Deter Treating Physician/Extender: Frann Rider in Treatment: 2 Wound Status Wound Number: 3 Primary Skin Tear Etiology: Wound Location: Left Lower Leg - Medial Wound Healed - Epithelialized Wounding Event: Shear/Friction Status: Date Acquired: 04/22/2015 Comorbid Sickle Cell Disease, Chronic Weeks Of Treatment: 1 History: Obstructive Pulmonary Disease Clustered Wound: No (COPD), Hypertension, Gout Photos Photo Uploaded By: Gretta Cool, RN, BSN, Kim on 04/29/2015 17:13:23 Wound Measurements Length: (cm) 0 % Reduction Width: (cm) 0 % Reduction Depth: (cm) 0 Epithelializ Area: (cm) 0 Tunneling: Volume: (cm) 0 in Area: 100% in Volume: 100% ation: Large (67-100%) No Wound Description  Classification: Partial Thickness Wound Margin: Flat and Intact Exudate Amount: None Present Foul Odor After Cleansing: No Wound Bed Granulation Amount: None Present (0%) Exposed Structure Necrotic Amount: None Present (0%) Fascia Exposed: No Fat Layer Exposed: No Tendon Exposed: No Muscle Exposed: No Joint Exposed: No BRANDY, MULLER ([id number]) Bone Exposed: No Limited to Skin Breakdown Periwound Skin Texture Texture Color No Abnormalities Noted: No No Abnormalities Noted: No Callus: No Atrophie Blanche: No Crepitus: No Cyanosis: No Excoriation: No Ecchymosis: No Fluctuance: No Erythema: No Friable: No Hemosiderin Staining: No Induration: No Mottled:  No Localized Edema: No Pallor: No Rash: No Rubor: No Scarring: No Temperature / Pain Moisture Temperature: No Abnormality No Abnormalities Noted: No Dry / Scaly: No Maceration: No Moist: No Wound Preparation Topical Anesthetic Applied: None Electronic Signature(s) Signed: 04/29/2015 4:48:38 PM By: Rebecca Eaton, RN, Sendra Entered By: Rebecca Eaton RN, Sendra on 04/29/2015 08:18:05 Jane Short ([id number]) -------------------------------------------------------------------------------- Vitals Details Patient Name: Jane Short Date of Service: 04/29/2015 8:00 AM Medical Record Number: [id number] Patient Account Number: [id number] Date of Birth/Sex: [birth year] (64 y.o. Female) Treating RN: Macarthur Critchley Primary Care Physician: Ronette Deter Other Clinician: Referring Physician: Ronette Deter Treating Physician/Extender: Frann Rider in Treatment: 2 Vital Signs Time Taken: 08:08 Temperature (F): 98.0 Height (in): 66 Pulse (bpm): 88 Weight (lbs): 198 Respiratory Rate (breaths/min): 16 Body Mass Index (BMI): 32 Blood Pressure (mmHg): 113/56 Reference Range: 80 - 120 mg / dl Pulse Oximetry (%): 94 Electronic Signature(s) Signed: 04/29/2015 4:48:38 PM By: Rebecca Eaton RN, Sendra Entered By: Rebecca Eaton RN, Sendra on 04/29/2015 08:11:18

## 2015-04-30 NOTE — Progress Notes (Signed)
KAIRY, BOLAM ([id number]) Visit Report for 04/29/2015 Chief Complaint Document Details Patient Name: Jane Short, Jane Short 04/29/2015 8:00 Date of Service: AM Medical Record [id number] Number: Patient Account Number: [id number] [birth year] (64 y.o. Treating RN: Macarthur Critchley Date of Birth/Sex: Female) Other Clinician: Primary Care Physician: Ronette Deter Treating Christin Fudge Referring Physician: Ronette Deter Physician/Extender: Suella Grove in Treatment: 2 Information Obtained from: Patient Chief Complaint Patient presents to the wound care center for a consult due non healing wound left lower extremity with draining fluid for about 2 months Electronic Signature(s) Signed: 04/29/2015 8:42:22 AM By: Christin Fudge MD, FACS Entered By: Christin Fudge on 04/29/2015 08:42:22 Jane Short ([id number]) -------------------------------------------------------------------------------- Debridement Details Patient Name: Jane Short 04/29/2015 8:00 Date of Service: AM Medical Record [id number] Number: Patient Account Number: [id number] 07-25-1950 (64 y.o. Treating RN: Macarthur Critchley Date of Birth/Sex: Female) Other Clinician: Primary Care Physician: Ronette Deter Treating Christin Fudge Referring Physician: Ronette Deter Physician/Extender: Suella Grove in Treatment: 2 Debridement Performed for Wound #1 Left,Proximal,Anterior Lower Leg Assessment: Performed By: Physician Christin Fudge, MD Debridement: Debridement Pre-procedure Yes Verification/Time Out Taken: Start Time: 08:25 Pain Control: Lidocaine 4% Topical Solution Level: Skin/Subcutaneous Tissue Total Area Debrided (L x 1.1 (cm) x 1.4 (cm) = 1.54 (cm) W): Tissue and other Viable, Non-Viable, Fibrin/Slough, Subcutaneous material debrided: Instrument: Curette Bleeding: Minimum Hemostasis Achieved: Pressure End Time: 08:27 Procedural Pain: 0 Post Procedural Pain: 0 Response to Treatment: Procedure was  tolerated well Post Debridement Measurements of Total Wound Length: (cm) 1.1 Width: (cm) 1.4 Depth: (cm) 0.2 Volume: (cm) 0.242 Post Procedure Diagnosis Same as Pre-procedure Electronic Signature(s) Signed: 04/29/2015 8:41:57 AM By: Christin Fudge MD, FACS Signed: 04/29/2015 4:48:38 PM By: Rebecca Eaton RN, Sendra Entered By: Christin Fudge on 04/29/2015 08:41:57 Jane Short ([id number]) -------------------------------------------------------------------------------- Debridement Details Patient Name: Jane Short. 04/29/2015 8:00 Date of Service: AM Medical Record [id number] Number: Patient Account Number: [id number] 05-19-1951 (64 y.o. Treating RN: Macarthur Critchley Date of Birth/Sex: Female) Other Clinician: Primary Care Physician: Ronette Deter Treating Christin Fudge Referring Physician: Ronette Deter Physician/Extender: Suella Grove in Treatment: 2 Debridement Performed for Wound #2 Left,Distal,Anterior Lower Leg Assessment: Performed By: Physician Christin Fudge, MD Debridement: Debridement Pre-procedure Yes Verification/Time Out Taken: Start Time: 08:25 Pain Control: Lidocaine 4% Topical Solution Level: Skin/Subcutaneous Tissue Total Area Debrided (L x 0.5 (cm) x 0.5 (cm) = 0.25 (cm) W): Tissue and other Viable, Non-Viable, Fibrin/Slough, Subcutaneous material debrided: Instrument: Curette Bleeding: Minimum Hemostasis Achieved: Pressure End Time: 08:27 Procedural Pain: 0 Post Procedural Pain: 0 Response to Treatment: Procedure was tolerated well Post Debridement Measurements of Total Wound Length: (cm) 0.5 Width: (cm) 0.5 Depth: (cm) 0.1 Volume: (cm) 0.02 Post Procedure Diagnosis Same as Pre-procedure Notes plan for punch biopsy next visit Electronic Signature(s) Signed: 04/29/2015 8:42:15 AM By: Christin Fudge MD, FACS Signed: 04/29/2015 4:48:38 PM By: Rebecca Eaton RN, Fortino Sic, Kristopher Oppenheim ([id number]) Entered By: Christin Fudge on 04/29/2015  08:42:15 Jane Short ([id number]) -------------------------------------------------------------------------------- HPI Details Patient Name: Jane Short, Jane Short 04/29/2015 8:00 Date of Service: AM Medical Record [id number] Number: Patient Account Number: [id number] 24-Jan-1951 (64 y.o. Treating RN: Macarthur Critchley Date of Birth/Sex: Female) Other Clinician: Primary Care Physician: Ronette Deter Treating Christin Fudge Referring Physician: Ronette Deter Physician/Extender: Suella Grove in Treatment: 2 History of Present Illness Location: has had 2 open wounds on the left lower extremity with swelling of her legs Quality: Patient reports experiencing a dull pain to affected area(s). Severity: Patient states wound are getting worse. Duration: Patient has had the wound for > 2 months prior  to seeking treatment at the wound center Timing: Pain in wound is constant (hurts all the time) Context: The wound appeared gradually over time. 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 Modifying Factors: Other treatment(s) tried include:Silvadene ointment Associated Signs and Symptoms: Patient reports having increase swelling. 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 Past medical history significant for sickle cell anemia, vitamin D deficiency, hypertension, osteoporosis, atrial myxoma, gout, lichen planus and status post tubal ligation and gallbladder surgery. She is a smoker and smokes about 10 cigarettes a day. 04/22/2015 -- while removing the tape she has had another ulceration possibly due to a tape burn and this is inferior medial to the previous 2 ulcerations. She is still working on giving up smoking. She  informs me that she will be moving back to [place] early next year and hence will not see a dermatologist here and is awaiting her physicians back in [place]. Electronic Signature(s) Signed: 04/29/2015 8:42:29 AM By: Christin Fudge MD, FACS Entered By: Christin Fudge on 04/29/2015 08:42:29 Jane Short ([id number]) -------------------------------------------------------------------------------- Physical Exam Details Patient Name: Jane Short, Jane Short 04/29/2015 8:00 Date of Service: AM Medical Record [id number] Number: Patient Account Number: [id number] 1951-04-29 (64 y.o. Treating RN: Macarthur Critchley Date of Birth/Sex: Female) Other Clinician: Primary Care Physician: Ronette Deter Treating Christin Fudge Referring Physician: Ronette Deter Physician/Extender: Suella Grove in Treatment: 2 Constitutional . Pulse regular. Respirations normal and unlabored. Afebrile. . Eyes Nonicteric. Reactive to light. Ears, Nose, Mouth, and Throat Lips, teeth, and gums WNL.Marland Kitchen Moist mucosa without lesions . Neck supple and nontender. No palpable supraclavicular or cervical adenopathy. Normal sized without goiter. Respiratory WNL. No retractions.. Cardiovascular Pedal Pulses WNL. No clubbing, cyanosis or edema. Gastrointestinal (GI) Abdomen without masses or tenderness.. No liver or spleen enlargement or tenderness.. Lymphatic No adneopathy. No adenopathy. No adenopathy. Musculoskeletal Adexa without tenderness or enlargement.. Digits and nails w/o clubbing, cyanosis, infection, petechiae, ischemia, or inflammatory conditions.. Integumentary (Hair, Skin) No suspicious lesions. No crepitus or fluctuance. No peri-wound warmth or erythema. No masses.Marland Kitchen Psychiatric Judgement and insight Intact.. No evidence of depression, anxiety, or agitation.. Notes and on her left lower extremity both have some slough which was sharply debrided with a curette. Since these wounds in the middle of a skin lesion  I believe it may benefit her if we reestablished the clinical diagnosis by taking a biopsy. Electronic Signature(s) Signed: 04/29/2015 8:43:25 AM By: Christin Fudge MD, FACS Entered By: Christin Fudge on 04/29/2015 08:43:25 Jane Short ([id number]) -------------------------------------------------------------------------------- Physician Orders Details Patient Name: Jane Short, Jane Short 04/29/2015 8:00 Date of Service: AM Medical Record [id number] Number: Patient Account Number: [id number] 05/14/1951 (64 y.o. Treating RN: Macarthur Critchley Date of Birth/Sex: Female) Other Clinician: Primary Care Physician: Ronette Deter Treating Christin Fudge Referring Physician: Ronette Deter Physician/Extender: Suella Grove in Treatment: 2 Verbal / Phone Orders: Yes Clinician: Macarthur Critchley Read Back and Verified: No Diagnosis Coding Wound Cleansing Wound #1 Left,Proximal,Anterior Lower Leg o Clean wound with Normal Saline. Wound #2 Left,Distal,Anterior Lower Leg o Clean wound with Normal Saline. Anesthetic Wound #1 Left,Proximal,Anterior Lower Leg o Topical Lidocaine 4% cream applied to wound bed prior to debridement Wound #2 Left,Distal,Anterior Lower Leg o Topical Lidocaine 4% cream applied to wound  bed prior to debridement Skin Barriers/Peri-Wound Care Wound #1 Left,Proximal,Anterior Lower Leg o Skin Prep Wound #2 Left,Distal,Anterior Lower Leg o Skin Prep Primary Wound Dressing Wound #1 Left,Proximal,Anterior Lower Leg o Medihoney gel Wound #2 Left,Distal,Anterior Lower Leg o Medihoney gel Secondary Dressing Wound #1 Left,Proximal,Anterior Lower Leg o Boardered Foam Dressing Wound #2 Left,Distal,Anterior Lower Leg o Boardered Foam Dressing Jane Short, Jane Short ([id number]) Dressing Change Frequency Wound #1 Left,Proximal,Anterior Lower Leg o Change dressing every day. Wound #2 Left,Distal,Anterior Lower Leg o Change dressing every day. Follow-up  Appointments Wound #1 Left,Proximal,Anterior Lower Leg o Return Appointment in 1 week. Wound #2 Left,Distal,Anterior Lower Leg o Return Appointment in 1 week. Edema Control Wound #1 Left,Proximal,Anterior Lower Leg o Patient to wear own compression stockings o Elevate legs to the level of the heart and pump ankles as often as possible Wound #2 Left,Distal,Anterior Lower Leg o Patient to wear own compression stockings o Elevate legs to the level of the heart and pump ankles as often as possible Additional Orders / Instructions Wound #1 Left,Proximal,Anterior Lower Leg o Stop Smoking Wound #2 Left,Distal,Anterior Lower Leg o Stop Smoking Laboratory o Biopsy for Pathology - schedule next week while in clinic oooo Electronic Signature(s) Signed: 04/29/2015 4:26:49 PM By: Christin Fudge MD, FACS Signed: 04/29/2015 4:48:38 PM By: Rebecca Eaton RN, Sendra Entered By: Rebecca Eaton RN, Sendra on 04/29/2015 08:31:04 Jane Short ([id number]) JALINA, FELICIANO ([id number]) -------------------------------------------------------------------------------- Problem List Details Patient Name: CYRIA, VESCO 04/29/2015 8:00 Date of Service: AM Medical Record [id number] Number: Patient Account Number: [id number] Apr 27, 1951 (64 y.o. 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 [id number] Lichen planus, unspecified 04/15/2015 Yes D57.1 Sickle-cell disease without crisis 04/15/2015 Yes N18.9 Chronic kidney disease, unspecified 04/15/2015 Yes F17.218 Nicotine dependence, cigarettes, 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 Treating RN: Macarthur Critchley Date of Birth/Sex: Female) Other Clinician: Primary Care Physician: Ronette Deter Treating Christin Fudge Referring Physician: Ronette Deter Physician/Extender: Suella Grove in Treatment: 2 Subjective Chief Complaint Information obtained from Patient Patient presents to the wound care center for a consult due non healing wound left lower extremity with draining fluid for about 2 months History of Present Illness (HPI) The following HPI elements were documented for the patient's wound: Location: has had 2 open wounds on the left lower extremity with swelling of her legs Quality: Patient reports experiencing a dull pain to affected area(s). Severity: Patient states wound are getting worse. Duration: Patient has had the wound for > 2 months prior to seeking treatment at the wound center Timing: Pain in wound is constant (hurts all the time) Context: The wound appeared gradually over time. 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 Modifying Factors: Other treatment(s) tried include:Silvadene ointment Associated Signs and Symptoms: Patient reports having increase swelling. 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 Past medical history significant for sickle cell anemia, vitamin D deficiency, hypertension, osteoporosis, atrial myxoma, gout, lichen planus and status post tubal ligation and gallbladder surgery. She is a smoker and smokes about 10 cigarettes a day. 04/22/2015 -- while removing the tape she has had another ulceration possibly due to a tape burn and this is inferior medial to the previous 2 ulcerations. She is still working on giving up smoking. She informs me that she will be moving back to [place] early next year and hence will not see a dermatologist here and is awaiting her physicians back in [place]. Objective Constitutional Jane Short, Jane Short ([id number]) Pulse regular. Respirations normal and unlabored. Afebrile. Vitals Time Taken: 8:08 AM, Height: 66 in, Weight: 198 lbs, BMI: 32, Temperature: 98.0 F, Pulse: 88 bpm, Respiratory Rate: 16 breaths/min, Blood Pressure: 113/56 mmHg, Pulse Oximetry: 94 %. Eyes Nonicteric. Reactive to light. Ears, Nose, Mouth, and Throat Lips, teeth, and gums WNL.Marland Kitchen Moist mucosa without lesions . Neck supple and nontender. No palpable supraclavicular or cervical adenopathy. Normal sized without goiter. Respiratory WNL. No retractions.. Cardiovascular Pedal Pulses WNL. No clubbing, cyanosis or edema. Gastrointestinal (GI) Abdomen without masses or tenderness.. No liver or spleen enlargement or tenderness.. Lymphatic No adneopathy. No adenopathy. No adenopathy. Musculoskeletal Adexa without tenderness or enlargement.. Digits and nails w/o clubbing, cyanosis, infection, petechiae, ischemia, or inflammatory conditions.Marland Kitchen Psychiatric Judgement and insight Intact.. No evidence of depression, anxiety, or agitation.. General Notes: and on her left lower extremity both have some slough which was sharply debrided with a curette. Since these wounds in the middle of a skin lesion I believe it may benefit her if we reestablished  the clinical diagnosis by taking a biopsy. Integumentary (Hair, Skin) No suspicious lesions. No crepitus or fluctuance. No peri-wound warmth or erythema. No masses.. Wound #1 status is Open. Original cause of wound was Gradually Appeared. The wound is located on the Left,Proximal,Anterior Lower Leg. The wound measures 1.1cm length x 1.4cm width x 0.2cm depth; 1.21cm^2 area and 0.242cm^3 volume. The wound is limited to skin breakdown. There is no tunneling or undermining noted. There is a medium amount of serous drainage noted. The wound margin is flat and intact. There is no granulation within the wound bed. There is a large (67-100%) amount of necrotic tissue within the wound bed including Adherent Slough. The periwound skin appearance exhibited: Moist. The periwound skin appearance did not exhibit: Callus, Crepitus, Excoriation, Fluctuance, Friable, Induration, Localized Edema, Rash, Scarring, Dry/Scaly, Maceration, Atrophie Blanche, Cyanosis, Ecchymosis, Hemosiderin Staining, Mottled, Pallor, Rubor, Erythema. The periwound has tenderness on palpation. Jane Short, Jane Short ([id number]) Wound #2 status is Open. Original cause of wound was Gradually Appeared. The wound is located on the [facility] Lower Leg. The wound measures 0.5cm length x 0.5cm width x 0.1cm depth; 0.196cm^2 area and 0.02cm^3 volume. The wound is limited to skin breakdown. There is no tunneling or undermining noted. There is a small amount of serous drainage noted. The wound margin is flat and intact. There is no granulation within the wound bed. There is a large (67-100%) amount of necrotic tissue within the wound bed including Adherent Slough. The periwound skin appearance did not exhibit: Callus, Crepitus, Excoriation, Fluctuance, Friable, Induration, Localized Edema, Rash, Scarring, Dry/Scaly, Maceration, Moist, Atrophie Blanche, Cyanosis, Ecchymosis, Hemosiderin Staining, Mottled, Pallor, Rubor,  Erythema. Periwound temperature was noted as No  Abnormality. Wound #3 status is Healed - Epithelialized. Original cause of wound was Shear/Friction. The wound is located on the Left,Medial Lower Leg. The wound measures 0cm length x 0cm width x 0cm depth; 0cm^2 area and 0cm^3 volume. The wound is limited to skin breakdown. There is no tunneling noted. There is a none present amount of drainage noted. The wound margin is flat and intact. There is no granulation within the wound bed. There is no necrotic tissue within the wound bed. The periwound skin appearance did not exhibit: Callus, Crepitus, Excoriation, Fluctuance, Friable, Induration, Localized Edema, Rash, Scarring, Dry/Scaly, Maceration, Moist, Atrophie Blanche, Cyanosis, Ecchymosis, Hemosiderin Staining, Mottled, Pallor, Rubor, Erythema. Periwound temperature was noted as No Abnormality. Assessment Active Problems ICD-10 I89.0 - Lymphedema, not elsewhere classified L97.222 - Non-pressure chronic ulcer of left calf with fat layer exposed [id number] - Lichen planus, unspecified D57.1 - Sickle-cell disease without crisis N18.9 - Chronic kidney disease, unspecified F17.218 - Nicotine dependence, cigarettes, with other nicotine-induced disorders The two wounds on her left lower extremity both have some slough which was sharply debrided with a curette. Since these wounds in the middle of a skin lesion I believe it may benefit her if we reestablished the clinical diagnosis by taking a biopsy. I have discussed this with her in great detail and she is agreeable about this and we will set her up to get a biopsy done on her next visit. I have recommended Medihoney to be applied daily with a bordered foam dressing and for her to use compression stockings of the 20-30 mm variety. I have also recommended elevation and exercise and continue to work with her nephrologist regarding her edema. Jane Short, Jane Short ([id number]) She continues to smoke about  8-10 cigarettes a day and I have asked her to seriously quit smoking. Procedures Wound #1 Wound #1 is a Lymphedema located on the Left,Proximal,Anterior Lower Leg . There was a Skin/Subcutaneous Tissue Debridement [id number] debridement with total area of 1.54 sq cm performed by Christin Fudge, MD. with the following instrument(s): Curette to remove Viable and Non-Viable tissue/material including Fibrin/Slough and Subcutaneous after achieving pain control using Lidocaine 4% Topical Solution. A time out was conducted prior to the start of the procedure. A Minimum amount of bleeding was controlled with Pressure. The procedure was tolerated well with a pain level of 0 throughout and a pain level of 0 following the procedure. Post Debridement Measurements: 1.1cm length x 1.4cm width x 0.2cm depth; 0.242cm^3 volume. Post procedure Diagnosis Wound #1: Same as Pre-Procedure Wound #2 Wound #2 is a Lymphedema located on the Left,Distal,Anterior Lower Leg . There was a Skin/Subcutaneous Tissue Debridement [id number]) debridement with total area of 0.25 sq cm performed by Christin Fudge, MD. with the following instrument(s): Curette to remove Viable and Non-Viable tissue/material including Fibrin/Slough and Subcutaneous after achieving pain control using Lidocaine 4% Topical Solution. A time out was conducted prior to the start of the procedure. A Minimum amount of bleeding was controlled with Pressure. The procedure was tolerated well with a pain level of 0 throughout and a pain level of 0 following the procedure. Post Debridement Measurements: 0.5cm length x 0.5cm width x 0.1cm depth; 0.02cm^3 volume. Post procedure Diagnosis Wound #2: Same as Pre-Procedure General Notes: plan for punch biopsy next visit. Plan Wound Cleansing: Wound #1 Left,Proximal,Anterior Lower Leg: Clean wound with Normal Saline. Wound #2 Left,Distal,Anterior Lower Leg: Clean wound with Normal Saline. Anesthetic: Wound #1  Left,Proximal,Anterior Lower Leg: Topical Lidocaine 4% cream applied to wound bed prior to  debridement Wound #2 Left,Distal,Anterior Lower Leg: Topical Lidocaine 4% cream applied to wound bed prior to debridement Skin Barriers/Peri-Wound Care: Wound #1 Left,Proximal,Anterior Lower Leg: Skin Prep Wound #2 Left,Distal,Anterior Lower Leg: Skin Prep Jane Short, Jane Short ([id number]) Primary Wound Dressing: Wound #1 Left,Proximal,Anterior Lower Leg: Medihoney gel Wound #2 Left,Distal,Anterior Lower Leg: Medihoney gel Secondary Dressing: Wound #1 Left,Proximal,Anterior Lower Leg: Boardered Foam Dressing Wound #2 Left,Distal,Anterior Lower Leg: Boardered Foam Dressing Dressing Change Frequency: Wound #1 Left,Proximal,Anterior Lower Leg: Change dressing every day. Wound #2 Left,Distal,Anterior Lower Leg: Change dressing every day. Follow-up Appointments: Wound #1 Left,Proximal,Anterior Lower Leg: Return Appointment in 1 week. Wound #2 Left,Distal,Anterior Lower Leg: Return Appointment in 1 week. Edema Control: Wound #1 Left,Proximal,Anterior Lower Leg: Patient to wear own compression stockings Elevate legs to the level of the heart and pump ankles as often as possible Wound #2 Left,Distal,Anterior Lower Leg: Patient to wear own compression stockings Elevate legs to the level of the heart and pump ankles as often as possible Additional Orders / Instructions: Wound #1 Left,Proximal,Anterior Lower Leg: Stop Smoking Wound #2 Left,Distal,Anterior Lower Leg: Stop Smoking Laboratory ordered were: Biopsy for Pathology - schedule next week while in clinic The two wounds on her left lower extremity both have some slough which was sharply debrided with a curette. Since these wounds in the middle of a skin lesion I believe it may benefit her if we reestablished the clinical diagnosis by taking a biopsy. I have discussed this with her in great detail and she is agreeable about this and we will  set her up to get a biopsy done on her next visit. I have recommended Medihoney to be applied daily with a bordered foam dressing and for her to use compression stockings of the 20-30 mm variety. I have also recommended elevation and exercise and continue to work with her nephrologist regarding her edema. Jane Short, Jane Short ([id number]) She continues to smoke about 8-10 cigarettes a day and I have asked her to seriously quit smoking. Electronic Signature(s) Signed: 04/29/2015 8:45:18 AM By: Christin Fudge MD, FACS Entered By: Christin Fudge on 04/29/2015 08:45:18 Jane Short ([id number]) -------------------------------------------------------------------------------- SuperBill Details Patient Name: Jane Short Date of Service: 04/29/2015 Medical Record Number: [id number] Patient Account Number: [id number] Date of Birth/Sex: [birth year] (64 y.o. 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 N18.9 Chronic kidney disease, unspecified F17.218 Nicotine dependence, cigarettes, with other nicotine-induced disorders Facility Procedures CPT4 Code Description: [id number] - DEB SUBQ TISSUE 20 SQ CM/< ICD-10 Description Diagnosis I89.0 Lymphedema, not elsewhere classified 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 with fat l [id number] Lichen planus, unspecified F17.218 Nicotine  dependence, cigarettes, with other nicoti Modifier: ayer exposed ne-induced di Quantity: 1 sorders Physician Procedures CPT4 Code Description: [id number] - WC PHYS SUBQ TISS 20 SQ CM ICD-10 Description Diagnosis I89.0  Lymphedema, not elsewhere classified 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

## 2015-05-01 ENCOUNTER — Inpatient Hospital Stay: Payer: BLUE CROSS/BLUE SHIELD

## 2015-05-01 VITALS — BP 117/69 | HR 96 | Temp 97.0°F | Resp 20

## 2015-05-01 DIAGNOSIS — D571 Sickle-cell disease without crisis: Secondary | ICD-10-CM

## 2015-05-01 DIAGNOSIS — I129 Hypertensive chronic kidney disease with stage 1 through stage 4 chronic kidney disease, or unspecified chronic kidney disease: Secondary | ICD-10-CM | POA: Diagnosis not present

## 2015-05-01 DIAGNOSIS — D631 Anemia in chronic kidney disease: Secondary | ICD-10-CM

## 2015-05-01 DIAGNOSIS — N189 Chronic kidney disease, unspecified: Principal | ICD-10-CM

## 2015-05-01 LAB — SAMPLE TO BLOOD BANK

## 2015-05-01 LAB — HEMOGLOBIN: Hemoglobin: 6.6 g/dL — ABNORMAL LOW (ref 12.0–16.0)

## 2015-05-01 MED ORDER — EPOETIN ALFA 40000 UNIT/ML IJ SOLN
40000.0000 [IU] | Freq: Once | INTRAMUSCULAR | Status: AC
  Administered 2015-05-01: 40000 [IU] via SUBCUTANEOUS
  Filled 2015-05-01: qty 1

## 2015-05-06 ENCOUNTER — Other Ambulatory Visit
Admission: RE | Admit: 2015-05-06 | Discharge: 2015-05-06 | Disposition: A | Discharge status: Home / Self Care | Payer: BLUE CROSS/BLUE SHIELD | Source: Ambulatory Visit | Attending: Surgery | Admitting: Surgery

## 2015-05-06 ENCOUNTER — Encounter: Payer: BLUE CROSS/BLUE SHIELD | Attending: Surgery | Admitting: Surgery

## 2015-05-06 DIAGNOSIS — D571 Sickle-cell disease without crisis: Secondary | ICD-10-CM | POA: Insufficient documentation

## 2015-05-06 DIAGNOSIS — N189 Chronic kidney disease, unspecified: Secondary | ICD-10-CM | POA: Diagnosis not present

## 2015-05-06 DIAGNOSIS — M109 Gout, unspecified: Secondary | ICD-10-CM | POA: Insufficient documentation

## 2015-05-06 DIAGNOSIS — I890 Lymphedema, not elsewhere classified: Secondary | ICD-10-CM | POA: Insufficient documentation

## 2015-05-06 DIAGNOSIS — M810 Age-related osteoporosis without current pathological fracture: Secondary | ICD-10-CM | POA: Insufficient documentation

## 2015-05-06 DIAGNOSIS — F17218 Nicotine dependence, cigarettes, with other nicotine-induced disorders: Secondary | ICD-10-CM | POA: Insufficient documentation

## 2015-05-06 DIAGNOSIS — L97222 Non-pressure chronic ulcer of left calf with fat layer exposed: Secondary | ICD-10-CM | POA: Insufficient documentation

## 2015-05-06 DIAGNOSIS — I129 Hypertensive chronic kidney disease with stage 1 through stage 4 chronic kidney disease, or unspecified chronic kidney disease: Secondary | ICD-10-CM | POA: Diagnosis not present

## 2015-05-06 DIAGNOSIS — L439 Lichen planus, unspecified: Secondary | ICD-10-CM | POA: Diagnosis not present

## 2015-05-07 NOTE — Progress Notes (Signed)
Jane Short ([id number]) Visit Report for 05/06/2015 Arrival Information Details Patient Name: Jane Short, Jane Short. Date of Service: 05/06/2015 8:00 AM Medical Record Number: [id number] Patient Account Number: [id number] Date of Birth/Sex: [birth year] (64 y.o. Female) Treating RN: Cornell Barman Primary Care Physician: Ronette Deter Other Clinician: Referring Physician: Ronette Deter Treating Physician/Extender: Frann Rider in Treatment: 3 Visit Information History Since Last Visit Added or deleted any medications: No Patient Arrived: Ambulatory Any new allergies or adverse reactions: No Arrival Time: 08:11 Had a fall or experienced change in No Accompanied By: self activities of daily living that may affect Transfer Assistance: None risk of falls: Patient Identification Verified: Yes Signs or symptoms of abuse/neglect since last No Secondary Verification Process Yes visito Completed: Hospitalized since last visit: No Patient Requires Transmission-Based No Has Dressing in Place as Prescribed: Yes Precautions: Pain Present Now: No Patient Has Alerts: No Electronic Signature(s) Signed: 05/06/2015 5:12:08 PM By: Gretta Cool, RN, BSN, Kim RN, BSN Entered By: Gretta Cool, RN, BSN, Kim on 05/06/2015 08:12:08 Jane Short ([id number]) -------------------------------------------------------------------------------- Encounter Discharge Information Details Patient Name: Jane Short Date of Service: 05/06/2015 8:00 AM Medical Record Number: [id number] Patient Account Number: [id number] Date of Birth/Sex: [birth year] (64 y.o. Female) Treating RN: Cornell Barman Primary Care Physician: Ronette Deter Other Clinician: Referring Physician: Ronette Deter Treating Physician/Extender: Frann Rider in Treatment: 3 Encounter Discharge Information Items Discharge Pain Level: 3 Discharge Condition: Stable Ambulatory Status: Ambulatory Discharge Destination:  Home Transportation: Private Auto Accompanied By: self Schedule Follow-up Appointment: Yes Medication Reconciliation completed and provided to Patient/Care Yes Braeley Buskey: Provided on Clinical Summary of Care: 05/06/2015 Form Type Recipient Paper Patient JW Electronic Signature(s) Signed: 05/06/2015 8:40:16 AM By: Ruthine Dose Entered By: Ruthine Dose on 05/06/2015 08:40:16 Jane Short ([id number]) -------------------------------------------------------------------------------- Lower Extremity Assessment Details Patient Name: Jane Short Date of Service: 05/06/2015 8:00 AM Medical Record Number: [id number] Patient Account Number: [id number] Date of Birth/Sex: 06/23/1950 (64 y.o. Female) Treating RN: Cornell Barman Primary Care Physician: Ronette Deter Other Clinician: Referring Physician: Ronette Deter Treating Physician/Extender: Frann Rider in Treatment: 3 Edema Assessment Assessed: [Left: No] [Right: No] E[Left: dema] [Right: :] Calf Left: Right: Point of Measurement: cm From Medial Instep 37 cm cm Ankle Left: Right: Point of Measurement: cm From Medial Instep 21.3 cm cm Vascular Assessment Pulses: Posterior Tibial Dorsalis Pedis Palpable: [Left:Yes] Extremity colors, hair growth, and conditions: Extremity Color: [Left:Normal] Hair Growth on Extremity: [Left:No] Temperature of Extremity: [Left:Warm] Capillary Refill: [Left:< 3 seconds] Toe Nail Assessment Left: Right: Thick: No Discolored: No Deformed: No Improper Length and Hygiene: No Electronic Signature(s) Signed: 05/06/2015 5:12:08 PM By: Gretta Cool, RN, BSN, Kim RN, BSN Entered By: Gretta Cool, RN, BSN, Kim on 05/06/2015 08:17:17 Jane Short ([id number]) -------------------------------------------------------------------------------- Multi Wound Chart Details Patient Name: Jane Short Date of Service: 05/06/2015 8:00 AM Medical Record Number: [id number] Patient Account Number:  [id number] Date of Birth/Sex: Mar 14, 1951 (64 y.o. Female) Treating RN: Cornell Barman Primary Care Physician: Ronette Deter Other Clinician: Referring Physician: Ronette Deter Treating Physician/Extender: Frann Rider in Treatment: 3 Vital Signs Height(in): 66 Pulse(bpm): 96 Weight(lbs): 198 Blood Pressure 114/55 (mmHg): Body Mass Index(BMI): 32 Temperature(F): 98.1 Respiratory Rate 16 (breaths/min): Photos: [1:No Photos] [2:No Photos] [N/A:N/A] Wound Location: [1:Left, Proximal, Anterior Lower Leg] [2:Left, Distal, Anterior Lower N/A Leg] Wounding Event: [1:Gradually Appeared] [2:Gradually Appeared] [N/A:N/A] Primary Etiology: [1:Lymphedema] [2:Lymphedema] [N/A:N/A] Date Acquired: [1:03/18/2015] [2:03/18/2015] [N/A:N/A] Weeks of Treatment: [1:3] [2:3] [N/A:N/A] Wound Status: [1:Open] [2:Open] [N/A:N/A] Measurements L x W x D 0.9x1.4x0.3 [2:0.5x0.6x0.2] [N/A:N/A] (  cm) Area (cm) : [1:0.99] [2:0.236] [N/A:N/A] Volume (cm) : [1:0.297] [2:0.047] [N/A:N/A] % Reduction in Area: [1:3.00%] [2:-25.50%] [N/A:N/A] % Reduction in Volume: -45.60% [2:-147.40%] [N/A:N/A] Classification: [1:Full Thickness Without Exposed Support Structures] [2:Full Thickness Without Exposed Support Structures] [N/A:N/A] Periwound Skin Texture: No Abnormalities Noted [2:No Abnormalities Noted] [N/A:N/A] Periwound Skin [1:No Abnormalities Noted] [2:No Abnormalities Noted] [N/A:N/A] Moisture: Periwound Skin Color: No Abnormalities Noted [2:No Abnormalities Noted] [N/A:N/A] Tenderness on [1:No] [2:No] [N/A:N/A] Treatment Notes Electronic Signature(s) Signed: 05/06/2015 5:12:08 PM By: Gretta Cool, RN, BSN, Kim RN, BSN [address] [place] ([id number]) Entered By: Gretta Cool, RN, BSN, Kim on 05/06/2015 08:22:18 Jane Short ([id number]) -------------------------------------------------------------------------------- [place] Details Patient Name: Jane Short. Date of Service:  05/06/2015 8:00 AM Medical Record Number: [id number] Patient Account Number: [id number] Date of Birth/Sex: Mar 20, 1951 (64 y.o. Female) Treating RN: Cornell Barman Primary Care Physician: Ronette Deter Other Clinician: Referring Physician: Ronette Deter Treating Physician/Extender: Frann Rider in Treatment: 3 Active Inactive Abuse / Safety / Falls / Self Care Management Nursing Diagnoses: Impaired physical mobility Potential for falls Goals: Patient will remain injury free Date Initiated: 04/15/2015 Goal Status: Active Interventions: Assess fall risk on admission and as needed Notes: has not had any falls since admission to wound center Orientation to the Wound Care Program Nursing Diagnoses: Knowledge deficit related to the wound healing center program Goals: Patient/caregiver will verbalize understanding of the [facility] Program Date Initiated: 04/15/2015 Goal Status: Active Interventions: Provide education on orientation to the wound center Notes: Wound/Skin Impairment Nursing Diagnoses: Impaired tissue integrity Goals: ZOLAH, ROZAK ([id number]) Patient/caregiver will verbalize understanding of skin care regimen Date Initiated: 04/15/2015 Goal Status: Active Ulcer/skin breakdown will have a volume reduction of 30% by week 4 Date Initiated: 04/15/2015 Goal Status: Active Ulcer/skin breakdown will have a volume reduction of 50% by week 8 Date Initiated: 04/15/2015 Goal Status: Active Ulcer/skin breakdown will have a volume reduction of 80% by week 12 Date Initiated: 04/15/2015 Goal Status: Active Ulcer/skin breakdown will heal within 14 weeks Date Initiated: 04/15/2015 Goal Status: Active Interventions: Assess ulceration(s) every visit Notes: Electronic Signature(s) Signed: 05/06/2015 5:12:08 PM By: Gretta Cool, RN, BSN, Kim RN, BSN Entered By: Gretta Cool, RN, BSN, Kim on 05/06/2015 08:22:08 Jane Short  ([id number]) -------------------------------------------------------------------------------- Pain Assessment Details Patient Name: Jane Short Date of Service: 05/06/2015 8:00 AM Medical Record Number: [id number] Patient Account Number: [id number] Date of Birth/Sex: 03/09/1951 (64 y.o. Female) Treating RN: Cornell Barman Primary Care Physician: Ronette Deter Other Clinician: Referring Physician: Ronette Deter Treating Physician/Extender: Frann Rider in Treatment: 3 Active Problems Location of Pain Severity and Description of Pain Patient Has Paino No Site Locations Pain Management and Medication Current Pain Management: Electronic Signature(s) Signed: 05/06/2015 5:12:08 PM By: Gretta Cool, RN, BSN, Kim RN, BSN Entered By: Gretta Cool, RN, BSN, Kim on 05/06/2015 08:12:14 Jane Short ([id number]) -------------------------------------------------------------------------------- Patient/Caregiver Education Details Patient Name: Jane Short Date of Service: 05/06/2015 8:00 AM Medical Record Number: [id number] Patient Account Number: [id number] Date of Birth/Gender: 06/12/1950 (64 y.o. Female) Treating RN: Cornell Barman Primary Care Physician: Ronette Deter Other Clinician: Referring Physician: Ronette Deter Treating Physician/Extender: Frann Rider in Treatment: 3 Education Assessment Education Provided To: Patient Education Topics Provided Wound/Skin Impairment: Handouts: Caring for Your Ulcer, Other: wound care as prescribed Methods: Demonstration, Explain/Verbal Responses: State content correctly Electronic Signature(s) Signed: 05/06/2015 5:12:08 PM By: Gretta Cool, RN, BSN, Kim RN, BSN Entered By: Gretta Cool, RN, BSN, Kim on 05/06/2015 08:39:35 Jane Short ([id number]) -------------------------------------------------------------------------------- Wound Assessment Details Patient Name: Redmond Pulling,  Shalynn C. Date of Service: 05/06/2015 8:00 AM Medical Record  Number: [id number] Patient Account Number: [id number] Date of Birth/Sex: 12/27/1950 (63 y.o. Female) Treating RN: Cornell Barman Primary Care Physician: Ronette Deter Other Clinician: Referring Physician: Ronette Deter Treating Physician/Extender: Frann Rider in Treatment: 3 Wound Status Wound Number: 1 Primary Etiology: Lymphedema Wound Location: Left, Proximal, Anterior Lower Wound Status: Open Leg Wounding Event: Gradually Appeared Date Acquired: 03/18/2015 Weeks Of Treatment: 3 Clustered Wound: No Photos Photo Uploaded By: Gretta Cool, RN, BSN, Kim on 05/06/2015 12:44:36 Wound Measurements Length: (cm) 0.9 Width: (cm) 1.4 Depth: (cm) 0.3 Area: (cm) 0.99 Volume: (cm) 0.297 % Reduction in Area: 3% % Reduction in Volume: -45.6% Wound Description Full Thickness Without Exposed Classification: Support Structures Periwound Skin Texture Texture Color No Abnormalities Noted: No No Abnormalities Noted: No Moisture No Abnormalities Noted: No Treatment Notes YAKIMA, LIDE ([id number]) Wound #1 (Left, Proximal, Anterior Lower Leg) 1. Cleansed with: Clean wound with Normal Saline 2. Anesthetic 2% Lidocaine injectible with epinephrine prior to debridement 4. Dressing Applied: Medihoney Gel 5. Secondary Dressing Applied Bordered Foam Dressing 7. Secured with Patient to wear own compression stockings Electronic Signature(s) Signed: 05/06/2015 5:12:08 PM By: Gretta Cool, RN, BSN, Kim RN, BSN Entered By: Gretta Cool, RN, BSN, Kim on 05/06/2015 08:19:33 Jane Short ([id number]) -------------------------------------------------------------------------------- Wound Assessment Details Patient Name: Jane Short Date of Service: 05/06/2015 8:00 AM Medical Record Number: [id number] Patient Account Number: [id number] Date of Birth/Sex: 1950-06-25 (64 y.o. Female) Treating RN: Cornell Barman Primary Care Physician: Ronette Deter Other Clinician: Referring Physician:  Ronette Deter Treating Physician/Extender: Frann Rider in Treatment: 3 Wound Status Wound Number: 2 Primary Etiology: Lymphedema Wound Location: Left, Distal, Anterior Lower Leg Wound Status: Open Wounding Event: Gradually Appeared Date Acquired: 03/18/2015 Weeks Of Treatment: 3 Clustered Wound: No Photos Photo Uploaded By: Gretta Cool, RN, BSN, Kim on 05/06/2015 12:44:45 Wound Measurements Length: (cm) 0.5 Width: (cm) 0.6 Depth: (cm) 0.2 Area: (cm) 0.236 Volume: (cm) 0.047 % Reduction in Area: -25.5% % Reduction in Volume: -147.4% Wound Description Full Thickness Without Exposed Classification: Support Structures Periwound Skin Texture Texture Color No Abnormalities Noted: No No Abnormalities Noted: No Moisture No Abnormalities Noted: No Treatment Notes Wound #2 (Left, Distal, Anterior Lower Leg) ADELIA, BORDEN C. ([id number]) 1. Cleansed with: Clean wound with Normal Saline 2. Anesthetic 2% Lidocaine injectible with epinephrine prior to debridement 4. Dressing Applied: Medihoney Gel 5. Secondary Dressing Applied Bordered Foam Dressing 7. Secured with Patient to wear own compression stockings Electronic Signature(s) Signed: 05/06/2015 5:12:08 PM By: Gretta Cool, RN, BSN, Kim RN, BSN Entered By: Gretta Cool, RN, BSN, Kim on 05/06/2015 08:19:33 Jane Short ([id number]) -------------------------------------------------------------------------------- [place] Details Patient Name: Jane Short Date of Service: 05/06/2015 8:00 AM Medical Record Number: [id number] Patient Account Number: [id number] Date of Birth/Sex: 01-06-1951 (64 y.o. Female) Treating RN: Cornell Barman Primary Care Physician: Ronette Deter Other Clinician: Referring Physician: Ronette Deter Treating Physician/Extender: Frann Rider in Treatment: 3 Vital Signs Time Taken: 08:12 Temperature (F): 98.1 Height (in): 66 Pulse (bpm): 96 Weight (lbs): 198 Respiratory Rate  (breaths/min): 16 Body Mass Index (BMI): 32 Blood Pressure (mmHg): 114/55 Reference Range: 80 - 120 mg / dl Electronic Signature(s) Signed: 05/06/2015 5:12:08 PM By: Gretta Cool, RN, BSN, Kim RN, BSN Entered By: Gretta Cool, RN, BSN, Kim on 05/06/2015 08:13:59

## 2015-05-07 NOTE — Progress Notes (Signed)
PAELYN, PILCH ([id number]) Visit Report for 05/06/2015 Biopsy Details Patient Name: Jane Short, Jane Short. Date of Service: 05/06/2015 8:00 AM Medical Record Number: [id number] Patient Account Number: [id number] Date of Birth/Sex: [birth year] (64 y.o. Female) Treating RN: Cornell Barman Primary Care Physician: Ronette Deter Other Clinician: Referring Physician: Ronette Deter Treating Physician/Extender: Frann Rider in Treatment: 3 Biopsy Performed for: Wound #1 Left, Proximal, Anterior Lower Leg Location(s): Wound Margin Performed By: Physician Christin Fudge, MD Tissue Punch: Yes Size (mm): 3 Number of Specimens Taken: 2 Specimen Sent To Pathology: Yes Time-Out Taken: Yes Pain Control: Lidocaine Injectable Lidocaine Percent: 2% Instrument: Blade, Forceps Bleeding: Moderate Hemostasis Achieved: Pressure Procedural Pain: 3 Post Procedural Pain: 3 Response to Treatment: Procedure was tolerated well Post Procedure Diagnosis Same as Pre-procedure Notes the superior lesion was biopsied at the 12 and 6:00 position. Electronic Signature(s) Signed: 05/06/2015 8:50:21 AM By: Christin Fudge MD, FACS Entered By: Christin Fudge on 05/06/2015 08:50:21 Jane Short ([id number]) -------------------------------------------------------------------------------- Chief Complaint Document Details Patient Name: Jane Short Date of Service: 05/06/2015 8:00 AM Medical Record Number: [id number] Patient Account Number: [id number] Date of Birth/Sex: 30-Aug-1950 (64 y.o. Female) Treating RN: Cornell Barman Primary Care Physician: Ronette Deter Other Clinician: Referring Physician: Ronette Deter Treating Physician/Extender: Frann Rider in Treatment: 3 Information Obtained from: Patient Chief Complaint Patient presents to the wound care center for a consult due non healing wound left lower extremity with draining fluid for about 2 months Electronic Signature(s) Signed:  05/06/2015 8:50:29 AM By: Christin Fudge MD, FACS Entered By: Christin Fudge on 05/06/2015 08:50:29 Jane Short ([id number]) -------------------------------------------------------------------------------- HPI Details Patient Name: Jane Short Date of Service: 05/06/2015 8:00 AM Medical Record Number: [id number] Patient Account Number: [id number] Date of Birth/Sex: [birth year] (64 y.o. Female) Treating RN: Cornell Barman Primary Care Physician: Ronette Deter Other Clinician: Referring Physician: Ronette Deter Treating Physician/Extender: Frann Rider in Treatment: 3 History of Present Illness Location: has had 2 open wounds on the left lower extremity with swelling of her legs Quality: Patient reports experiencing a dull pain to affected area(s). Severity: Patient states wound are getting worse. Duration: Patient has had the wound for > 2 months prior to seeking treatment at the wound center Timing: Pain in wound is constant (hurts all the time) Context: The wound appeared gradually over time. 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 Modifying Factors: Other treatment(s) tried include:Silvadene ointment Associated Signs and Symptoms: Patient reports having increase swelling. 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 Past medical history significant for sickle cell anemia, vitamin D deficiency, hypertension, osteoporosis, atrial myxoma, gout, lichen planus and status post tubal ligation and gallbladder surgery. She is a smoker and smokes about 10 cigarettes a day. 04/22/2015 -- while removing the tape she has had another ulceration possibly due to a tape burn and this is inferior medial  to the previous 2 ulcerations. She is still working on giving up smoking. She informs me that she will be moving back to [place] early next year and hence will not see a dermatologist here and is awaiting her physicians back in [place]. 05/06/2015 -- she has quit smoking last week and is on E cigarettes now. We have discussed  the merits of this. He also complains of some swelling and pain in the right lower extremity which has been going on since last week. Electronic Signature(s) Signed: 05/06/2015 8:51:09 AM By: Christin Fudge MD, FACS Entered By: Christin Fudge on 05/06/2015 08:51:09 Jane Short ([id number]) -------------------------------------------------------------------------------- Physical Exam Details Patient Name: Jane Short Date of Service: 05/06/2015 8:00 AM Medical Record Number: [id number] Patient Account Number: [id number] Date of Birth/Sex: 06-Sep-1950 (64 y.o. Female) Treating RN: Cornell Barman Primary Care Physician: Ronette Deter Other Clinician: Referring Physician: Ronette Deter Treating Physician/Extender: Frann Rider in Treatment: 3 Constitutional . Pulse regular. Respirations normal and unlabored. Afebrile. . Eyes Nonicteric. Reactive to light. Ears, Nose, Mouth, and Throat Lips, teeth, and gums WNL.Marland Kitchen Moist mucosa without lesions . Neck supple and nontender. No palpable supraclavicular or cervical adenopathy. Normal sized without goiter. Respiratory WNL. No retractions.. Cardiovascular Pedal Pulses WNL. the right leg does not show any evidence of significant swelling or DVT clinically and Homans sign is negative.. Lymphatic No adneopathy. No adenopathy. No adenopathy. Musculoskeletal Adexa without tenderness or enlargement.. Digits and nails w/o clubbing, cyanosis, infection, petechiae, ischemia, or inflammatory conditions.. Integumentary (Hair, Skin) No suspicious lesions. No crepitus or fluctuance. No peri-wound warmth or  erythema. No masses.Marland Kitchen Psychiatric Judgement and insight Intact.. No evidence of depression, anxiety, or agitation.. Notes the 2 ulcerations of the left lower extremity have sharply been debrided and then under local anesthesia punch biopsies were taken from the superior lesion at the 12 and 6:00 position. Electronic Signature(s) Signed: 05/06/2015 8:51:59 AM By: Christin Fudge MD, FACS Entered By: Christin Fudge on 05/06/2015 08:51:59 Jane Short ([id number]) -------------------------------------------------------------------------------- Physician Orders Details Patient Name: Jane Short Date of Service: 05/06/2015 8:00 AM Medical Record Number: [id number] Patient Account Number: [id number] Date of Birth/Sex: March 21, 1951 (64 y.o. Female) Treating RN: Cornell Barman Primary Care Physician: Ronette Deter Other Clinician: Referring Physician: Ronette Deter Treating Physician/Extender: Frann Rider in Treatment: 3 Verbal / Phone Orders: Yes Clinician: Cornell Barman Read Back and Verified: Yes Diagnosis Coding Wound Cleansing Wound #1 Left,Proximal,Anterior Lower Leg o Clean wound with Normal Saline. Wound #2 Left,Distal,Anterior Lower Leg o Clean wound with Normal Saline. Anesthetic Wound #1 Left,Proximal,Anterior Lower Leg o Topical Lidocaine 4% cream applied to wound bed prior to debridement Wound #2 Left,Distal,Anterior Lower Leg o Topical Lidocaine 4% cream applied to wound bed prior to debridement Skin Barriers/Peri-Wound Care Wound #1 Left,Proximal,Anterior Lower Leg o Skin Prep Wound #2 Left,Distal,Anterior Lower Leg o Skin Prep Primary Wound Dressing Wound #1 Left,Proximal,Anterior Lower Leg o Medihoney gel Wound #2 Left,Distal,Anterior Lower Leg o Medihoney gel Secondary Dressing Wound #1 Left,Proximal,Anterior Lower Leg o Boardered Foam Dressing Wound #2 Left,Distal,Anterior Lower Leg o Boardered Foam Dressing Dressing Change  Frequency Jane Short, Jane Short ([id number]) Wound #1 Left,Proximal,Anterior Lower Leg o Change dressing every day. Wound #2 Left,Distal,Anterior Lower Leg o Change dressing every day. Follow-up Appointments Wound #1 Left,Proximal,Anterior Lower Leg o Return Appointment in 1 week. Wound #2 Left,Distal,Anterior Lower Leg o Return Appointment in 1 week. Edema Control Wound #1 Left,Proximal,Anterior Lower Leg o Patient to wear own compression stockings o Elevate legs to the level of the heart and pump ankles as often as possible Wound #2 Left,Distal,Anterior Lower Leg o Patient to wear own compression stockings o Elevate legs to the level of the heart and pump ankles as often as possible Additional Orders / Instructions Wound #1 Left,Proximal,Anterior Lower Leg o Stop Smoking Wound #2 Left,Distal,Anterior Lower Leg o Stop Smoking Electronic Signature(s) Signed: 05/06/2015 4:22:10  PM By: Christin Fudge MD, FACS Signed: 05/06/2015 5:12:08 PM By: Gretta Cool RN, BSN, Kim RN, BSN Entered By: Gretta Cool, RN, BSN, Kim on 05/06/2015 08:33:55 Jane Short ([id number]) -------------------------------------------------------------------------------- Problem List Details Patient Name: Jane Short, Jane Short Date of Service: 05/06/2015 8:00 AM Medical Record Number: [id number] Patient Account Number: [id number] Date of Birth/Sex: Apr 05, 1951 (64 y.o. 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 [id number] Lichen planus, unspecified 04/15/2015 Yes D57.1 Sickle-cell disease without crisis 04/15/2015 Yes N18.9 Chronic kidney disease,  unspecified 04/15/2015 Yes F17.218 Nicotine dependence, cigarettes, 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/15/2015 Yes induced disorders Inactive Problems Resolved Problems Electronic Signature(s) Signed: 05/06/2015 8:49:34 AM By: Christin Fudge MD, FACS Entered By: Christin Fudge on 05/06/2015 08:49:34 Jane Short ([id number]) -------------------------------------------------------------------------------- Progress Note Details Patient Name: Jane Short Date of Service: 05/06/2015 8:00 AM Medical Record Number: [id number] Patient Account Number: [id number] Date of Birth/Sex: 05-20-1951 (64 y.o. Female) Treating RN: Cornell Barman Primary Care Physician: Ronette Deter Other Clinician: Referring Physician: Ronette Deter Treating Physician/Extender: Frann Rider in Treatment: 3 Subjective Chief Complaint Information obtained from Patient Patient presents to the wound care center for a consult due non healing wound left lower extremity with draining fluid for about 2 months History of Present Illness (HPI) The following HPI elements were documented for the patient's wound: Location: has had 2 open wounds on the left lower extremity with swelling of her legs Quality: Patient reports experiencing a dull pain to affected area(s). Severity: Patient states wound are getting worse. Duration: Patient has had the wound for > 2 months prior to seeking treatment at the wound center Timing: Pain in wound is constant (hurts all the time) Context: The wound appeared gradually over time. 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 Modifying Factors: Other treatment(s) tried include:Silvadene ointment Associated Signs and Symptoms: Patient reports having increase swelling. 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 Past medical history significant for sickle cell anemia, vitamin D deficiency, hypertension, osteoporosis, atrial myxoma, gout, lichen planus and status post tubal ligation and gallbladder surgery. She is a smoker and smokes about 10 cigarettes a day. 04/22/2015 -- while removing the tape she has had another ulceration possibly due to a tape burn and this is inferior medial to the previous 2 ulcerations. She is still working on giving up smoking. She informs me that she will be moving back to [place] early next year and hence will not see a dermatologist here and is awaiting her physicians back in [place]. 05/06/2015 -- she has quit smoking last week and is on E cigarettes now. We have discussed the merits of this. He also complains of some swelling and pain in the right lower extremity which has been going on since last week. Jane Short, Jane Short ([id number]) Objective Constitutional Pulse regular. Respirations normal and unlabored. Afebrile. Vitals Time Taken: 8:12 AM, Height: 66 in, Weight: 198 lbs, BMI: 32, Temperature: 98.1 F, Pulse: 96 bpm, Respiratory Rate: 16 breaths/min, Blood  Pressure: 114/55 mmHg. Eyes Nonicteric. Reactive to light. Ears, Nose, Mouth, and Throat Lips, teeth, and gums WNL.Marland Kitchen Moist mucosa without lesions . Neck supple and nontender. No palpable supraclavicular or cervical adenopathy. Normal sized without goiter. Respiratory WNL. No retractions.. Cardiovascular Pedal Pulses WNL. the right leg does not show any evidence of significant swelling or DVT clinically and Homans sign is negative.. Lymphatic No adneopathy. No adenopathy. No adenopathy. Musculoskeletal Adexa without tenderness or enlargement.. Digits and nails w/o clubbing, cyanosis, infection, petechiae, ischemia, or inflammatory conditions.Marland Kitchen Psychiatric Judgement and insight Intact.. No evidence of depression,  anxiety, or agitation.. General Notes: the 2 ulcerations of the left lower extremity have sharply been debrided and then under local anesthesia punch biopsies were taken from the superior lesion at the 12 and 6:00 position. Integumentary (Hair, Skin) No suspicious lesions. No crepitus or fluctuance. No peri-wound warmth or erythema. No masses.. Wound #1 status is Open. Original cause of wound was Gradually Appeared. The wound is located on the Left,Proximal,Anterior Lower Leg. The wound measures 0.9cm length x 1.4cm width x 0.3cm depth; 0.99cm^2 area and 0.297cm^3 volume. Wound #2 status is Open. Original cause of wound was Gradually Appeared. The wound is located on the [facility] Lower Leg. The wound measures 0.5cm length x 0.6cm width x 0.2cm depth; Jane Short, Jane C. ([id number]) 0.236cm^2 area and 0.047cm^3 volume. Assessment Active Problems ICD-10 I89.0 - Lymphedema, not elsewhere classified L97.222 - Non-pressure chronic ulcer of left calf with fat layer exposed [id number] - Lichen planus, unspecified D57.1 - Sickle-cell disease without crisis N18.9 - Chronic kidney disease, unspecified F17.218 - Nicotine dependence, cigarettes, with other nicotine-induced disorders I have recommended Medihoney to be applied daily with a bordered foam dressing and for her to use compression stockings of the 20-30 mm variety. I have also recommended elevation and exercise and continue to work with her nephrologist regarding her edema. as far as her swelling and pain in the right lower extremity I do not find clinical evidence of DVT but I have asked her to call her PCP and she may want to get a Doppler duplex study of the veins to ascertain whether there are signs of deep vein thrombosis of the right lower extremity. I have commended her on giving up smoking. She should now work on giving up the e-cigarettes. Procedures Wound #1 Wound #1 is a Lymphedema located on the Left, Proximal, Anterior  Lower Leg . There was a biopsy performed by Christin Fudge, MD. There was a biopsy performed on Wound Margin. The skin was cleansed and prepped with anti-septic followed by pain control using Lidocaine Injectable: 2%. Utilizing a 3 mm tissue punch, tissue was removed at its base with the following instrument(s): Blade and Forceps and sent to pathology. A Moderate amount of bleeding was controlled with Pressure. A time out was conducted prior to the start of the procedure. The procedure was tolerated well with a pain level of 3 throughout and a pain level of 3 following the procedure. Post procedure Diagnosis Wound #1: Same as Pre-Procedure General Notes: the superior lesion was biopsied at the 12 and 6:00 position.Jane Short, Jane Short ([id number]) Plan Wound Cleansing: Wound #1 Left,Proximal,Anterior Lower Leg: Clean wound with Normal Saline. Wound #2 Left,Distal,Anterior Lower Leg: Clean wound with Normal Saline. Anesthetic: Wound #1 Left,Proximal,Anterior Lower Leg: Topical Lidocaine 4% cream applied to wound bed prior to debridement Wound #2 Left,Distal,Anterior Lower Leg: Topical Lidocaine 4% cream applied to wound bed prior to debridement Skin Barriers/Peri-Wound Care: Wound #1 Left,Proximal,Anterior Lower Leg: Skin  Prep Wound #2 Left,Distal,Anterior Lower Leg: Skin Prep Primary Wound Dressing: Wound #1 Left,Proximal,Anterior Lower Leg: Medihoney gel Wound #2 Left,Distal,Anterior Lower Leg: Medihoney gel Secondary Dressing: Wound #1 Left,Proximal,Anterior Lower Leg: Boardered Foam Dressing Wound #2 Left,Distal,Anterior Lower Leg: Boardered Foam Dressing Dressing Change Frequency: Wound #1 Left,Proximal,Anterior Lower Leg: Change dressing every day. Wound #2 Left,Distal,Anterior Lower Leg: Change dressing every day. Follow-up Appointments: Wound #1 Left,Proximal,Anterior Lower Leg: Return Appointment in 1 week. Wound #2 Left,Distal,Anterior Lower Leg: Return Appointment  in 1 week. Edema Control: Wound #1 Left,Proximal,Anterior Lower Leg: Patient to wear own compression stockings Elevate legs to the level of the heart and pump ankles as often as possible Wound #2 Left,Distal,Anterior Lower Leg: Patient to wear own compression stockings Elevate legs to the level of the heart and pump ankles as often as possible Additional Orders / Instructions: Wound #1 Left,Proximal,Anterior Lower Leg: Stop Smoking Jane Short, Jane Short ([id number]) Wound #2 Left,Distal,Anterior Lower Leg: Stop Smoking I have recommended Medihoney to be applied daily with a bordered foam dressing and for her to use compression stockings of the 20-30 mm variety. I have also recommended elevation and exercise and continue to work with her nephrologist regarding her edema. as far as her swelling and pain in the right lower extremity I do not find clinical evidence of DVT but I have asked her to call her PCP and she may want to get a Doppler duplex study of the veins to ascertain whether there are signs of deep vein thrombosis of the right lower extremity. I have commended her on giving up smoking. She should now work on giving up the e-cigarettes. Electronic Signature(s) Signed: 05/06/2015 8:53:56 AM By: Christin Fudge MD, FACS Entered By: Christin Fudge on 05/06/2015 08:53:56 Jane Short ([id number]) -------------------------------------------------------------------------------- SuperBill Details Patient Name: Jane Short Date of Service: 05/06/2015 Medical Record Number: [id number] Patient Account Number: [id number] Date of Birth/Sex: 1950-08-31 (64 y.o. Female) Treating RN: Cornell Barman Primary Care Physician: Ronette Deter Other Clinician: Referring Physician: Ronette Deter Treating Physician/Extender: Frann Rider in Treatment: 3 Diagnosis Coding ICD-10 Codes Code Description I89.0 Lymphedema, not elsewhere classified L97.222 Non-pressure chronic ulcer of left  calf with fat layer exposed [id number] Lichen planus, unspecified D57.1 Sickle-cell disease without crisis N18.9 Chronic kidney disease, unspecified F17.218 Nicotine dependence, cigarettes, with other nicotine-induced disorders Facility Procedures CPT4 Code: [id number] Description: 11101 - BIOPSY SKIN GREATER THAN ONE ICD-10 Description Diagnosis I89.0 Lymphedema, not elsewhere classified [id number] Lichen planus, unspecified 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 with fat la Modifier: yer exposed Quantity: 1 Physician Procedures CPT4 Code: [id number] Description: 11101 - WC PHYS BX-SKIN MULTI AREAS ICD-10 Description Diagnosis I89.0 Lymphedema, not elsewhere classified [id number] Lichen planus, unspecified 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 with fat l Modifier: ayer exposed Quantity: 1 Electronic Signature(s) Signed: 05/06/2015 8:55:19 AM By: Christin Fudge MD, FACS Previous Signature: 05/06/2015 8:55:03 AM Version By: Christin Fudge MD, FACS Entered By: Christin Fudge on 05/06/2015 08:55:19

## 2015-05-08 ENCOUNTER — Ambulatory Visit (INDEPENDENT_AMBULATORY_CARE_PROVIDER_SITE_OTHER): Payer: BLUE CROSS/BLUE SHIELD | Admitting: Internal Medicine

## 2015-05-08 ENCOUNTER — Inpatient Hospital Stay: Payer: BLUE CROSS/BLUE SHIELD

## 2015-05-08 ENCOUNTER — Ambulatory Visit (INDEPENDENT_AMBULATORY_CARE_PROVIDER_SITE_OTHER): Payer: BLUE CROSS/BLUE SHIELD | Admitting: General Surgery

## 2015-05-08 ENCOUNTER — Encounter: Payer: Self-pay | Admitting: General Surgery

## 2015-05-08 ENCOUNTER — Encounter: Payer: Self-pay | Admitting: Internal Medicine

## 2015-05-08 ENCOUNTER — Inpatient Hospital Stay: Payer: BLUE CROSS/BLUE SHIELD | Attending: Internal Medicine

## 2015-05-08 VITALS — BP 130/62 | HR 101 | Ht 67.0 in | Wt 208.0 lb

## 2015-05-08 VITALS — BP 136/68 | HR 72 | Resp 16 | Ht 66.0 in | Wt 207.0 lb

## 2015-05-08 DIAGNOSIS — N189 Chronic kidney disease, unspecified: Principal | ICD-10-CM

## 2015-05-08 DIAGNOSIS — K439 Ventral hernia without obstruction or gangrene: Secondary | ICD-10-CM | POA: Diagnosis not present

## 2015-05-08 DIAGNOSIS — G4719 Other hypersomnia: Secondary | ICD-10-CM | POA: Diagnosis not present

## 2015-05-08 DIAGNOSIS — D571 Sickle-cell disease without crisis: Secondary | ICD-10-CM | POA: Diagnosis present

## 2015-05-08 DIAGNOSIS — D638 Anemia in other chronic diseases classified elsewhere: Secondary | ICD-10-CM | POA: Diagnosis not present

## 2015-05-08 DIAGNOSIS — Z79899 Other long term (current) drug therapy: Secondary | ICD-10-CM | POA: Insufficient documentation

## 2015-05-08 DIAGNOSIS — D631 Anemia in chronic kidney disease: Secondary | ICD-10-CM

## 2015-05-08 LAB — SURGICAL PATHOLOGY

## 2015-05-08 LAB — HEMOGLOBIN: HEMOGLOBIN: 6.6 g/dL — AB (ref 12.0–16.0)

## 2015-05-08 MED ORDER — UMECLIDINIUM BROMIDE 62.5 MCG/INH IN AEPB: 1.0000 | INHALATION_SPRAY | Freq: Every day | RESPIRATORY_TRACT | Status: AC

## 2015-05-08 MED ORDER — EPOETIN ALFA 40000 UNIT/ML IJ SOLN
40000.0000 [IU] | Freq: Once | INTRAMUSCULAR | Status: AC
  Administered 2015-05-08: 40000 [IU] via SUBCUTANEOUS
  Filled 2015-05-08: qty 1

## 2015-05-08 MED ORDER — UMECLIDINIUM BROMIDE 62.5 MCG/INH IN AEPB: 1.0000 | INHALATION_SPRAY | Freq: Every day | RESPIRATORY_TRACT | Status: DC

## 2015-05-08 NOTE — Progress Notes (Signed)
Patient ID: Jane Short, female   DOB: 1951/02/25, 64 y.o.   MRN: [id number]  Chief Complaint  Patient presents with  . Other    ventral hernia    HPI Jane Short is a 64 y.o. female here today for an evaluation of a ventral hernia. She first noticed the hernia about a month ago after she had a respiratory infection and was coughing a lot. She reports her abdomen is constantly sore and tender, does not sleep well.  HPIShe is aware of the hernia in upper abdomen but also feels like she may have one in lower abdomen  Past Medical History  Diagnosis Date  . Sickle cell anemia (HCC)   . Vitamin D deficiency   . Hypertension   . Osteoporosis 2011    osteopenia  . 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 Cheree Ditto at [facility]  . Gout   . Sickle cell anemia (HCC)   . Lichen planus   . SCA-1 (spinocerebellar ataxia type 1) ([place])   . Lichen planus 32/10/7122  . COPD (chronic obstructive pulmonary disease) (HCC)     symptoms Dr. Patricia Pesa     Past Surgical History  Procedure Laterality Date  . Tubal ligation    . Gallbladder surgery    . Colonoscopy  2014    [facility]    Family History  Problem Relation Age of Onset  . Heart disease Father   . Diabetes Father   . Diabetes Sister   . Cancer Mother     breast cancer  . Cancer Maternal Aunt     Breast cancer    Social History Social History  Substance Use Topics  . Smoking status: Former Smoker -- 0.25 packs/day for 30 years    Types: Cigarettes    Quit date: 04/29/2015  . Smokeless tobacco: Never Used  . Alcohol Use: No    Allergies  Allergen Reactions  . Ramipril     cough    Current Outpatient Prescriptions  Medication Sig Dispense Refill  . albuterol (PROVENTIL HFA;VENTOLIN HFA) 108 (90 BASE) MCG/ACT inhaler Inhale 2 puffs into the lungs every 6 (six) hours as needed. 18 g 6  . allopurinol (ZYLOPRIM) 100 MG tablet Take 100 mg by mouth daily.     . calcitRIOL (ROCALTROL) 0.25 MCG capsule Take 0.25 mcg  by mouth daily.    . cyclobenzaprine (FLEXERIL) 5 MG tablet Take 1 tablet (5 mg total) by mouth at bedtime. 30 tablet 1  . folic acid (FOLVITE) 1 MG tablet Take 1 mg by mouth daily.    . furosemide (LASIX) 20 MG tablet Take 60 mg by mouth daily.     . hydroxyurea (HYDREA) 500 MG capsule Take 500 mg by mouth daily. May take with food to minimize GI side effects.    Marland Kitchen losartan (COZAAR) 50 MG tablet Take 50 mg by mouth daily.    . mometasone-formoterol (DULERA) 200-5 MCG/ACT AERO Inhale 2 puffs into the lungs 2 (two) times daily. 1 Inhaler 12  . rosuvastatin (CRESTOR) 5 MG tablet Take 1 tablet (5 mg total) by mouth daily. 90 tablet 3  . silver sulfADIAZINE (SILVADENE) 1 % cream Apply 1 application topically 2 (two) times daily. 50 g 0  . Umeclidinium Bromide (INCRUSE ELLIPTA) 62.5 MCG/INH AEPB Inhale 1 puff into the lungs daily. 30 each 5   No current facility-administered medications for this visit.   Facility-Administered Medications Ordered in Other Visits  Medication Dose Route Frequency Provider Last  Rate Last Dose  . heparin lock flush 100 unit/mL  250 Units Intracatheter PRN Leia Alf, MD      . heparin lock flush 100 unit/mL  500 Units Intravenous Once Leia Alf, MD      . sodium chloride 0.9 % injection 10 mL  10 mL Intravenous PRN Leia Alf, MD   10 mL at 02/20/15 1530  . sodium chloride 0.9 % injection 10 mL  10 mL Intravenous PRN Leia Alf, MD   10 mL at 02/20/15 1530  . sodium chloride 0.9 % injection 10 mL  10 mL Intravenous PRN Leia Alf, MD   10 mL at 02/27/15 1410  . sodium chloride 0.9 % injection 3 mL  3 mL Intracatheter PRN Leia Alf, MD   3 mL at 10/05/14 0851    Review of Systems Review of Systems  Constitutional: Negative.   Respiratory: Positive for cough, choking, shortness of breath and wheezing.   Cardiovascular: Negative.     Blood pressure 136/68, pulse 72, resp. rate 16, height 5' 6"  (1.676 m), weight 207 lb (93.895  kg).  Physical Exam Physical Exam  Constitutional: She is oriented to person, place, and time. She appears well-developed and well-nourished.  Eyes: Conjunctivae are normal. No scleral icterus.  Neck: Neck supple.  Cardiovascular: Normal rate and regular rhythm.   Murmur heard.  Systolic murmur is present with a grade of 3/6  Pulmonary/Chest: Effort normal and breath sounds normal.  Abdominal: Soft. Normal appearance. A hernia is present.    Lymphadenopathy:    She has no cervical adenopathy.  Neurological: She is alert and oriented to person, place, and time.  Skin: Skin is warm and dry.    Data Reviewed PCP notes   Assessment    Ventral hernia.     Plan   CT scan may help to assess extent of the hernia in epigastric region and any other hernia in lower abdimen Patient has been scheduled for a CT abdomen/pelvis without contrast (oral contrast only) at [place] for 05-17-15 at 8:30 am (arrive 8:15 am). Prep: NPO after midnight and pick up prep kit. Patient verbalizes understanding.      PCP:  Johnny Bridge 05/10/2015, 10:11 AM

## 2015-05-08 NOTE — Patient Instructions (Signed)
Chronic Obstructive Pulmonary Disease Chronic obstructive pulmonary disease (COPD) is a common lung condition in which airflow from the lungs is limited. COPD is a general term that can be used to describe many different lung problems that limit airflow, including both chronic bronchitis and emphysema. If you have COPD, your lung function will probably never return to normal, but there are measures you can take to improve lung function and make yourself feel better. CAUSES   Smoking (common).  Exposure to secondhand smoke.  Genetic problems.  Chronic inflammatory lung diseases or recurrent infections. SYMPTOMS  Shortness of breath, especially with physical activity.  Deep, persistent (chronic) cough with a large amount of thick mucus.  Wheezing.  Rapid breaths (tachypnea).  Gray or bluish discoloration (cyanosis) of the skin, especially in your fingers, toes, or lips.  Fatigue.  Weight loss.  Frequent infections or episodes when breathing symptoms become much worse (exacerbations).  Chest tightness. DIAGNOSIS Your health care provider will take a medical history and perform a physical examination to diagnose COPD. Additional tests for COPD may include:  Lung (pulmonary) function tests.  Chest X-ray.  CT scan.  Blood tests. TREATMENT  Treatment for COPD may include:  Inhaler and nebulizer medicines. These help manage the symptoms of COPD and make your breathing more comfortable.  Supplemental oxygen. Supplemental oxygen is only helpful if you have a low oxygen level in your blood.  Exercise and physical activity. These are beneficial for nearly all people with COPD.  Lung surgery or transplant.  Nutrition therapy to gain weight, if you are underweight.  Pulmonary rehabilitation. This may involve working with a team of health care providers and specialists, such as respiratory, occupational, and physical therapists. HOME CARE INSTRUCTIONS  Take all medicines  (inhaled or pills) as directed by your health care provider.  Avoid over-the-counter medicines or cough syrups that dry up your airway (such as antihistamines) and slow down the elimination of secretions unless instructed otherwise by your health care provider.  If you are a smoker, the most important thing that you can do is stop smoking. Continuing to smoke will cause further lung damage and breathing trouble. Ask your health care provider for help with quitting smoking. He or she can direct you to community resources or hospitals that provide support.  Avoid exposure to irritants such as smoke, chemicals, and fumes that aggravate your breathing.  Use oxygen therapy and pulmonary rehabilitation if directed by your health care provider. If you require home oxygen therapy, ask your health care provider whether you should purchase a pulse oximeter to measure your oxygen level at home.  Avoid contact with individuals who have a contagious illness.  Avoid extreme temperature and humidity changes.  Eat healthy foods. Eating smaller, more frequent meals and resting before meals may help you maintain your strength.  Stay active, but balance activity with periods of rest. Exercise and physical activity will help you maintain your ability to do things you want to do.  Preventing infection and hospitalization is very important when you have COPD. Make sure to receive all the vaccines your health care provider recommends, especially the pneumococcal and influenza vaccines. Ask your health care provider whether you need a pneumonia vaccine.  Learn and use relaxation techniques to manage stress.  Learn and use controlled breathing techniques as directed by your health care provider. Controlled breathing techniques include:  Pursed lip breathing. Start by breathing in (inhaling) through your nose for 1 second. Then, purse your lips as if you were   going to whistle and breathe out (exhale) through the  pursed lips for 2 seconds.  Diaphragmatic breathing. Start by putting one hand on your abdomen just above your waist. Inhale slowly through your nose. The hand on your abdomen should move out. Then purse your lips and exhale slowly. You should be able to feel the hand on your abdomen moving in as you exhale.  Learn and use controlled coughing to clear mucus from your lungs. Controlled coughing is a series of short, progressive coughs. The steps of controlled coughing are: 1. Lean your head slightly forward. 2. Breathe in deeply using diaphragmatic breathing. 3. Try to hold your breath for 3 seconds. 4. Keep your mouth slightly open while coughing twice. 5. Spit any mucus out into a tissue. 6. Rest and repeat the steps once or twice as needed. SEEK MEDICAL CARE IF:  You are coughing up more mucus than usual.  There is a change in the color or thickness of your mucus.  Your breathing is more labored than usual.  Your breathing is faster than usual. SEEK IMMEDIATE MEDICAL CARE IF:  You have shortness of breath while you are resting.  You have shortness of breath that prevents you from:  Being able to talk.  Performing your usual physical activities.  You have chest pain lasting longer than 5 minutes.  Your skin color is more cyanotic than usual.  You measure low oxygen saturations for longer than 5 minutes with a pulse oximeter. MAKE SURE YOU:  Understand these instructions.  Will watch your condition.  Will get help right away if you are not doing well or get worse.   This information is not intended to replace advice given to you by your health care provider. Make sure you discuss any questions you have with your health care provider.   Document Released: 02/25/2005 Document Revised: 06/08/2014 Document Reviewed: 01/12/2013 Elsevier Interactive Patient Education 2016 [employer]  

## 2015-05-08 NOTE — Patient Instructions (Signed)
Patient has been scheduled for a CT abdomen/pelvis without contrast (oral contrast only) at [place] for 05-17-15 at 8:30 am (arrive 8:15 am). Prep: NPO after midnight and pick up prep kit. Patient verbalizes understanding.

## 2015-05-08 NOTE — Progress Notes (Signed)
Date: 05/08/2015,   MRN# [id number] Jane Short 1950/08/19 Code Status:  Hosp day:@LENGTHOFSTAYDAYS @ Referring MD: @ATDPROV @     PCP:      AdmissionWeight: 208 lb (94.348 kg)                 CurrentWeight: 208 lb (94.348 kg) Jane Short is a 64 y.o. old female seen in consultation for chronic SOB and cough.     CHIEF COMPLAINT:   Chronic cough and SOB   HISTORY OF PRESENT ILLNESS  Patient could NOT perform PFT's due to excessive coughing] Patient states that dulera has helped Patient has chronic SOB, no fevers, chills  Patient states that she has gained 20 pounds in last several months She has SCD and CKD sees Dr. Abigail Butts CKD Patient has been on chronic lasix therapy  Patient has daytime sleepiness, early morning fatigue and tiredness Patient with excessive snoring per husband     Current Medication:  Current outpatient prescriptions:  .  albuterol (PROVENTIL HFA;VENTOLIN HFA) 108 (90 BASE) MCG/ACT inhaler, Inhale 2 puffs into the lungs every 6 (six) hours as needed., Disp: 18 g, Rfl: 6 .  allopurinol (ZYLOPRIM) 100 MG tablet, Take 100 mg by mouth daily. , Disp: , Rfl:  .  calcitRIOL (ROCALTROL) 0.25 MCG capsule, Take 0.25 mcg by mouth daily., Disp: , Rfl:  .  cyclobenzaprine (FLEXERIL) 5 MG tablet, Take 1 tablet (5 mg total) by mouth at bedtime., Disp: 30 tablet, Rfl: 1 .  folic acid (FOLVITE) 1 MG tablet, Take 1 mg by mouth daily., Disp: , Rfl:  .  furosemide (LASIX) 20 MG tablet, Take 60 mg by mouth daily. , Disp: , Rfl:  .  hydroxyurea (HYDREA) 500 MG capsule, Take 500 mg by mouth daily. May take with food to minimize GI side effects., Disp: , Rfl:  .  losartan (COZAAR) 50 MG tablet, Take 50 mg by mouth daily., Disp: , Rfl:  .  mometasone-formoterol (DULERA) 200-5 MCG/ACT AERO, Inhale 2 puffs into the lungs 2 (two) times daily., Disp: 1 Inhaler, Rfl: 12 .  rosuvastatin (CRESTOR) 5 MG tablet, Take 1 tablet (5 mg total) by mouth daily., Disp: 90 tablet, Rfl: 3 .   silver sulfADIAZINE (SILVADENE) 1 % cream, Apply 1 application topically 2 (two) times daily., Disp: 50 g, Rfl: 0 No current facility-administered medications for this visit.  Facility-Administered Medications Ordered in Other Visits:  .  heparin lock flush 100 unit/mL, 250 Units, Intracatheter, PRN, Leia Alf, MD .  heparin lock flush 100 unit/mL, 500 Units, Intravenous, Once, Leia Alf, MD .  sodium chloride 0.9 % injection 10 mL, 10 mL, Intravenous, PRN, Leia Alf, MD, 10 mL at 02/20/15 1530 .  sodium chloride 0.9 % injection 10 mL, 10 mL, Intravenous, PRN, Leia Alf, MD, 10 mL at 02/20/15 1530 .  sodium chloride 0.9 % injection 10 mL, 10 mL, Intravenous, PRN, Leia Alf, MD, 10 mL at 02/27/15 1410 .  sodium chloride 0.9 % injection 3 mL, 3 mL, Intracatheter, PRN, Leia Alf, MD, 3 mL at 10/05/14 0851    ALLERGIES   Ramipril     REVIEW OF SYSTEMS   Review of Systems  Constitutional: Negative for fever, chills and malaise/fatigue.  Respiratory: Positive for shortness of breath. Negative for cough, hemoptysis, sputum production and wheezing.   Cardiovascular: Positive for orthopnea and leg swelling. Negative for chest pain.  Gastrointestinal: Positive for abdominal pain.     VS: BP 130/62 mmHg  Pulse 101  Ht 5\' 7"  (  1.702 m)  Wt 208 lb (94.348 kg)  BMI 32.57 kg/m2  SpO2 92%     PHYSICAL EXAM  Physical Exam  Constitutional: She is oriented to person, place, and time. No distress.  Cardiovascular: Normal rate, regular rhythm and normal heart sounds.   No murmur heard. Pulmonary/Chest: Effort normal and breath sounds normal. No respiratory distress. She has no wheezes. She has no rales.  Abdominal: Soft. Bowel sounds are normal.  Musculoskeletal: Normal range of motion. She exhibits edema.  Neurological: She is alert and oriented to person, place, and time.  Skin: She is not diaphoretic.  Psychiatric: She has a normal mood and affect.              ASSESSMENT/PLAN    64 yo AAF with chronic cough and  chronic SOB with wheezing with chronic tobacco abuse likely related to underlying COPD with diastolic dysfunction with CKD and probable underlying OSA  1.LE swelling-patient advised to increased lasix to 60 BID after discussion with Nephrologist 2.start Incruse-continue Dulera 3.will need Sleep study to assess for OSA 4.follow up with Nephrology within 1 week   The Patient requires high complexity decision making for assessment and support, frequent evaluation and titration of therapies, application of advanced monitoring technologies and extensive interpretation of multiple databases.  Patient is satisfied with Plan of action and management.    Corrin Parker, M.D.  [facility]  Medical Director [place] Director [facility] Cardio-Pulmonary Department

## 2015-05-10 ENCOUNTER — Encounter: Payer: Self-pay | Admitting: General Surgery

## 2015-05-13 ENCOUNTER — Encounter: Payer: BLUE CROSS/BLUE SHIELD | Admitting: Surgery

## 2015-05-13 DIAGNOSIS — L97222 Non-pressure chronic ulcer of left calf with fat layer exposed: Secondary | ICD-10-CM | POA: Diagnosis not present

## 2015-05-13 NOTE — Progress Notes (Addendum)
STEFANNY, WINKOWSKI ([id number]) Visit Report for 05/13/2015 Chief Complaint Document Details Patient Name: Jane Short, Jane Short 05/13/2015 8:00 Date of Service: AM Medical Record [id number] Number: Patient Account Number: [id number] Aug 19, 1950 (64 y.o. Treating RN: Ahmed Prima Date of Birth/Sex: Female) Other Clinician: Primary Care Physician: Ronette Deter Treating Christin Fudge Referring Physician: Ronette Deter Physician/Extender: Suella Grove in Treatment: 4 Information Obtained from: Patient Chief Complaint Patient presents to the wound care center for a consult due non healing wound left lower extremity with draining fluid for about 2 months Electronic Signature(s) Signed: 05/13/2015 9:03:52 AM By: Christin Fudge MD, FACS Entered By: Christin Fudge on 05/13/2015 09:03:52 Jane Short ([id number]) -------------------------------------------------------------------------------- Debridement Details Patient Name: Jane Short 05/13/2015 8:00 Date of Service: AM Medical Record [id number] Number: Patient Account Number: [id number] [birth year] (64 y.o. Treating RN: Ahmed Prima Date of Birth/Sex: Female) Other Clinician: Primary Care Physician: Ronette Deter Treating Christin Fudge Referring Physician: Ronette Deter Physician/Extender: Suella Grove in Treatment: 4 Debridement Performed for Wound #1 Left,Proximal,Anterior Lower Leg Assessment: Performed By: Physician Christin Fudge, MD Debridement: Debridement Pre-procedure Yes Verification/Time Out Taken: Start Time: 08:49 Pain Control: Lidocaine 5% topical ointment Level: Skin/Subcutaneous Tissue Total Area Debrided (L x 1 (cm) x 1.5 (cm) = 1.5 (cm) W): Tissue and other Viable, Non-Viable, Exudate, Fibrin/Slough, Subcutaneous material debrided: Instrument: Curette Bleeding: Minimum Hemostasis Achieved: Pressure End Time: 08:52 Procedural Pain: 2 Post Procedural Pain: 0 Response to Treatment: Procedure  was tolerated well Post Debridement Measurements of Total Wound Length: (cm) 1 Width: (cm) 1.5 Depth: (cm) 0.3 Volume: (cm) 0.353 Post Procedure Diagnosis Same as Pre-procedure Electronic Signature(s) Signed: 05/13/2015 9:03:44 AM By: Christin Fudge MD, FACS Signed: 05/14/2015 3:59:50 PM By: Alric Quan Entered By: Christin Fudge on 05/13/2015 09:03:44 Jane Short ([id number]) -------------------------------------------------------------------------------- Debridement Details Patient Name: Jane Short 05/13/2015 8:00 Date of Service: AM Medical Record [id number] Number: Patient Account Number: [id number] 18-Oct-1950 (64 y.o. Treating RN: Ahmed Prima Date of Birth/Sex: Female) Other Clinician: Primary Care Physician: Ronette Deter Treating Christin Fudge Referring Physician: Ronette Deter Physician/Extender: Suella Grove in Treatment: 4 Debridement Performed for Wound #2 Left,Distal,Anterior Lower Leg Assessment: Performed By: Physician Christin Fudge, MD Debridement: Debridement Pre-procedure Yes Verification/Time Out Taken: Start Time: 08:49 Pain Control: Lidocaine 5% topical ointment Level: Skin/Subcutaneous Tissue Total Area Debrided (L x 0.9 (cm) x 0.9 (cm) = 0.81 (cm) W): Tissue and other Viable, Non-Viable, Exudate, Fibrin/Slough, Subcutaneous material debrided: Instrument: Curette Bleeding: Minimum Hemostasis Achieved: Pressure End Time: 08:52 Procedural Pain: 2 Post Procedural Pain: 0 Response to Treatment: Procedure was tolerated well Post Debridement Measurements of Total Wound Length: (cm) 0.9 Width: (cm) 0.9 Depth: (cm) 0.1 Volume: (cm) 0.064 Post Procedure Diagnosis Same as Pre-procedure Electronic Signature(s) Signed: 05/13/2015 4:36:00 PM By: Christin Fudge MD, FACS Signed: 05/14/2015 3:59:50 PM By: Alric Quan Entered By: Alric Quan on 05/13/2015 09:29:32 Jane Short  ([id number]) -------------------------------------------------------------------------------- HPI Details Patient Name: Jane Short 05/13/2015 8:00 Date of Service: AM Medical Record [id number] Number: Patient Account Number: [id number] [birth year] (64 y.o. Treating RN: Ahmed Prima Date of Birth/Sex: Female) Other Clinician: Primary Care Physician: Ronette Deter Treating Christin Fudge Referring Physician: Ronette Deter Physician/Extender: Suella Grove in Treatment: 4 History of Present Illness Location: has had 2 open wounds on the left lower extremity with swelling of her legs Quality: Patient reports experiencing a dull pain to affected area(s). Severity: Patient states wound are getting worse. Duration: Patient has had the wound for > 2 months prior to seeking treatment at the wound center Timing: Pain in wound  is constant (hurts all the time) Context: The wound appeared gradually over time. 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 Modifying Factors: Other treatment(s) tried include:Silvadene ointment Associated Signs and Symptoms: Patient reports having increase swelling. 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 Past medical history significant for sickle cell anemia, vitamin D deficiency, hypertension, osteoporosis, atrial myxoma, gout, lichen planus and status post tubal ligation and gallbladder surgery. She is a smoker and smokes about 10 cigarettes a day. 04/22/2015 -- while removing the tape she has had another ulceration possibly due to a tape burn and this is inferior medial to the previous 2 ulcerations. She is still working on giving up smoking. She informs me that she will be  moving back to [place] early next year and hence will not see a dermatologist here and is awaiting her physicians back in [place]. 05/06/2015 -- she has quit smoking last week and is on E cigarettes now. We have discussed the merits of this. He also complains of some swelling and pain in the right lower extremity which has been going on since last week. 05/13/2015 -- pathology diagnosis-- Pathology report: DIAGNOSIS: A. SKIN, LEFT LOWER LEG; PUNCH BIOPSY: - STASIS DERMATITIS. I have discussed the above pathology report with her and she understands. Last week she saw her pulmonologist and she saw a surgeon regarding ascites and bilateral lower extremity edema. lab work, ultrasound and DVT studies have been ordered and reports are pending. Have asked her to get me these reports when she comes the next week or get her physicians to send them to me. Electronic Signature(s) CELECIA, HOHLT ([id number]) Signed: 05/13/2015 9:04:56 AM By: Christin Fudge MD, FACS Previous Signature: 05/13/2015 8:32:44 AM Version By: Christin Fudge MD, FACS Entered By: Christin Fudge on 05/13/2015 09:04:56 Jane Short ([id number]) -------------------------------------------------------------------------------- Physical Exam Details Patient Name: MALAKAI, BEATON 05/13/2015 8:00 Date of Service: AM Medical Record [id number] Number: Patient Account Number: [id number] 07/29/1950 (64 y.o. Treating RN: Ahmed Prima Date of Birth/Sex: Female) Other Clinician: Primary Care Physician: Ronette Deter Treating Christin Fudge Referring Physician: Ronette Deter Physician/Extender: Suella Grove in Treatment: 4 Constitutional . Pulse regular. Respirations normal and unlabored. Afebrile. . Eyes Nonicteric. Reactive to light. Ears, Nose, Mouth, and Throat Lips, teeth, and gums WNL.Marland Kitchen Moist mucosa without lesions . Neck supple and nontender. No palpable supraclavicular or cervical adenopathy. Normal sized without  goiter. Respiratory WNL. No retractions.. Cardiovascular Pedal Pulses WNL. she has +2 pitting edema both lower extremities.. Lymphatic No adneopathy. No adenopathy. No adenopathy. Musculoskeletal Adexa without tenderness or enlargement.. Digits and nails w/o clubbing, cyanosis, infection, petechiae, ischemia, or inflammatory conditions.. Integumentary (Hair, Skin) No suspicious lesions. No crepitus or fluctuance. No peri-wound warmth or erythema. No masses.Marland Kitchen Psychiatric Judgement and insight Intact.. No evidence of depression, anxiety, or agitation.. Notes the 2 ulcerations on her left lower extremity continue to have debris and they have been sharply dissected down to the subcutaneous tissue and all the slough has been removed. She has +2 pitting edema both lower extremities. Electronic Signature(s) Signed: 05/13/2015 9:05:43 AM By: Christin Fudge MD, FACS Entered By: Christin Fudge on 05/13/2015 09:05:43 Jane Short ([id number]) -------------------------------------------------------------------------------- Physician Orders Details Patient Name: Jane Short 05/13/2015  8:00 Date of Service: AM Medical Record [id number] Number: Patient Account Number: [id number] 1950-08-16 (64 y.o. Treating RN: Ahmed Prima Date of Birth/Sex: Female) Other Clinician: Primary Care Physician: Ronette Deter Treating Christin Fudge Referring Physician: Ronette Deter Physician/Extender: Suella Grove in Treatment: 4 Verbal / Phone Orders: Yes Clinician: Pinkerton, Debi Read Back and Verified: Yes Diagnosis Coding Wound Cleansing Wound #1 Left,Proximal,Anterior Lower Leg o Clean wound with Normal Saline. Wound #2 Left,Distal,Anterior Lower Leg o Clean wound with Normal Saline. Anesthetic Wound #1 Left,Proximal,Anterior Lower Leg o Topical Lidocaine 4% cream applied to wound bed prior to debridement Wound #2 Left,Distal,Anterior Lower Leg o Topical Lidocaine 4% cream  applied to wound bed prior to debridement Skin Barriers/Peri-Wound Care Wound #1 Left,Proximal,Anterior Lower Leg o Skin Prep Wound #2 Left,Distal,Anterior Lower Leg o Skin Prep Primary Wound Dressing Wound #1 Left,Proximal,Anterior Lower Leg o Medihoney gel Wound #2 Left,Distal,Anterior Lower Leg o Medihoney gel Secondary Dressing Wound #1 Left,Proximal,Anterior Lower Leg o Boardered Foam Dressing Wound #2 Left,Distal,Anterior Lower Leg o Boardered Foam Dressing JONESHA, CIERVO ([id number]) Dressing Change Frequency Wound #1 Left,Proximal,Anterior Lower Leg o Change dressing every day. Wound #2 Left,Distal,Anterior Lower Leg o Change dressing every day. Follow-up Appointments Wound #1 Left,Proximal,Anterior Lower Leg o Return Appointment in 1 week. Wound #2 Left,Distal,Anterior Lower Leg o Return Appointment in 1 week. Edema Control Wound #1 Left,Proximal,Anterior Lower Leg o Patient to wear own compression stockings o Elevate legs to the level of the heart and pump ankles as often as possible Wound #2 Left,Distal,Anterior Lower Leg o Patient to wear own compression stockings o Elevate legs to the level of the heart and pump ankles as often as possible Additional Orders / Instructions Wound #1 Left,Proximal,Anterior Lower Leg o Stop Smoking Wound #2 Left,Distal,Anterior Lower Leg o Stop Smoking Electronic Signature(s) Signed: 05/13/2015 4:36:00 PM By: Christin Fudge MD, FACS Signed: 05/14/2015 3:59:50 PM By: Alric Quan Entered By: Alric Quan on 05/13/2015 08:57:23 Jane Short ([id number]) -------------------------------------------------------------------------------- Problem List Details Patient Name: SHAVONNA, ELDREDGE 05/13/2015 8:00 Date of Service: AM Medical Record [id number] Number: Patient Account Number: [id number] November 25, 1950 (64 y.o. 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 [id number] Lichen planus, unspecified 04/15/2015 Yes D57.1 Sickle-cell disease without crisis 04/15/2015 Yes N18.9 Chronic kidney disease, unspecified 04/15/2015 Yes F17.218 Nicotine dependence, cigarettes, 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/15/2015 Yes induced disorders Inactive Problems Resolved Problems Electronic Signature(s) Signed: 05/13/2015 9:03:14 AM By: Christin Fudge MD, FACS Entered By: Christin Fudge on 05/13/2015 09:03:14 Jane Short ([id number]) -------------------------------------------------------------------------------- Progress Note Details Patient Name: Jane Short 05/13/2015 8:00 Date of Service: AM Medical Record [id number] Number: Patient Account Number: [id number] [birth year] (64 y.o. Treating RN: Ahmed Prima Date of Birth/Sex: Female) Other Clinician: Primary Care Physician: Ronette Deter Treating Christin Fudge Referring Physician: Ronette Deter Physician/Extender: Suella Grove in Treatment: 4 Subjective Chief Complaint Information obtained from Patient Patient presents to the wound care center for a consult due non healing wound left lower extremity with draining fluid for about 2 months History of Present Illness (HPI) The following HPI elements were documented for the patient's wound: Location: has had 2 open wounds on the left lower extremity with swelling of her legs Quality: Patient reports experiencing a dull pain to affected area(s). Severity:  Patient states wound are getting worse. Duration: Patient has had the wound for > 2 months prior to seeking treatment at the wound  center Timing: Pain in wound is constant (hurts all the time) Context: The wound appeared gradually over time. 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 Modifying Factors: Other treatment(s) tried include:Silvadene ointment Associated Signs and Symptoms: Patient reports having increase swelling. 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 Past medical history significant for sickle cell anemia, vitamin D deficiency, hypertension, osteoporosis, atrial myxoma, gout, lichen planus and status post tubal ligation and gallbladder surgery. She is a smoker and smokes about 10 cigarettes a day. 04/22/2015 -- while removing the tape she has had another ulceration possibly due to a tape burn and this is inferior medial to the previous 2 ulcerations. She is still working on giving up smoking. She informs me that she will be moving back to [place] early next year and hence will not see a dermatologist here and is awaiting her physicians back in [place]. 05/06/2015 -- she has quit smoking last week and is on E cigarettes now. We have discussed the merits of this. He also complains of some swelling and pain in the right lower extremity which has been going on since last week. 05/13/2015 -- pathology diagnosis-- Pathology report: DIAGNOSIS: A. SKIN, LEFT LOWER LEG; PUNCH BIOPSY: - STASIS DERMATITIS. I have discussed the above pathology report with her and she understands. RUA, LUCUS ([id number]) Last week she saw her pulmonologist and she saw a surgeon regarding ascites and bilateral lower extremity edema. lab work, ultrasound and DVT studies have been ordered and reports are pending. Have asked her to get me these reports when she  comes the next week or get her physicians to send them to me. Objective Constitutional Pulse regular. Respirations normal and unlabored. Afebrile. Vitals Time Taken: 8:34 AM, Height: 66 in, Weight: 198 lbs, BMI: 32, Temperature: 98.0 F, Pulse: 96 bpm, Respiratory Rate: 18 breaths/min, Blood Pressure: 119/68 mmHg. Eyes Nonicteric. Reactive to light. Ears, Nose, Mouth, and Throat Lips, teeth, and gums WNL.Marland Kitchen Moist mucosa without lesions . Neck supple and nontender. No palpable supraclavicular or cervical adenopathy. Normal sized without goiter. Respiratory WNL. No retractions.. Cardiovascular Pedal Pulses WNL. she has +2 pitting edema both lower extremities.. Lymphatic No adneopathy. No adenopathy. No adenopathy. Musculoskeletal Adexa without tenderness or enlargement.. Digits and nails w/o clubbing, cyanosis, infection, petechiae, ischemia, or inflammatory conditions.Marland Kitchen Psychiatric Judgement and insight Intact.. No evidence of depression, anxiety, or agitation.. General Notes: the 2 ulcerations on her left lower extremity continue to have debris and they have been sharply dissected down to the subcutaneous tissue and all the slough has been removed. She has +2 pitting edema both lower extremities. PACHIA, SHAKER ([id number]) Integumentary (Hair, Skin) No suspicious lesions. No crepitus or fluctuance. No peri-wound warmth or erythema. No masses.. Wound #1 status is Open. Original cause of wound was Gradually Appeared. The wound is located on the Left,Proximal,Anterior Lower Leg. The wound measures 1cm length x 1.5cm width x 0.2cm depth; 1.178cm^2 area and 0.236cm^3 volume. The wound is limited to skin breakdown. There is no tunneling or undermining noted. There is a small amount of serous drainage noted. The  wound margin is thickened. There is no granulation within the wound bed. There is a large (67-100%) amount of necrotic tissue within the wound bed including Adherent Slough.  The periwound skin appearance exhibited: Scarring, Moist. Periwound temperature was noted as No Abnormality. Wound #2 status is Open. Original cause of wound was Gradually Appeared. The wound is located on the [facility] Lower Leg. The wound measures 0.9cm length x 0.9cm width x 0.1cm depth; 0.636cm^2 area and 0.064cm^3 volume. The wound is limited to skin breakdown. There is no tunneling or undermining noted. There is a medium amount of serous drainage noted. The wound margin is thickened. There is no granulation within the wound bed. There is a large (67-100%) amount of necrotic tissue within the wound bed including Adherent Slough. The periwound skin appearance exhibited: Scarring, Moist. Periwound temperature was noted as No Abnormality. Assessment Active Problems ICD-10 I89.0 - Lymphedema, not elsewhere classified L97.222 - Non-pressure chronic ulcer of left calf with fat layer exposed [id number] - Lichen planus, unspecified D57.1 - Sickle-cell disease without crisis N18.9 - Chronic kidney disease, unspecified F17.218 - Nicotine dependence, cigarettes, with other nicotine-induced disorders I have recommended Medihoney to be applied daily with a bordered foam dressing and for her to use compression stockings of the 20-30 mm variety.he has not been doing this regularly and I have discussed the need to be very compliant starting first thing in the morning and taking them out lasting at night. We have also discussed elevation in bed with 2 pillows and appropriate exercises. She says she is going to be compliant in doing this on her reports the next week. Procedures ROSS, SZABO ([id number]) Wound #1 Wound #1 is a Lymphedema located on the Left,Proximal,Anterior Lower Leg . There was a Skin/Subcutaneous Tissue Debridement [id number]) debridement with total area of 1.5 sq cm performed by Christin Fudge, MD. with the following instrument(s): Curette to remove Viable and  Non-Viable tissue/material including Exudate, Fibrin/Slough, and Subcutaneous after achieving pain control using Lidocaine 5% topical ointment. A time out was conducted prior to the start of the procedure. A Minimum amount of bleeding was controlled with Pressure. The procedure was tolerated well with a pain level of 2 throughout and a pain level of 0 following the procedure. Post Debridement Measurements: 1cm length x 1.5cm width x 0.3cm depth; 0.353cm^3 volume. Post procedure Diagnosis Wound #1: Same as Pre-Procedure Wound #2 Wound #2 is a Lymphedema located on the Left,Distal,Anterior Lower Leg . There was a Skin/Subcutaneous Tissue Debridement [id number]) debridement with total area of 0.81 sq cm performed by Christin Fudge, MD. with the following instrument(s): Curette to remove Viable and Non-Viable tissue/material including Exudate, Fibrin/Slough, and Subcutaneous after achieving pain control using Lidocaine 5% topical ointment. A time out was conducted prior to the start of the procedure. A Minimum amount of bleeding was controlled with Pressure. The procedure was tolerated well with a pain level of 2 throughout and a pain level of 0 following the procedure. Post Debridement Measurements: 0.9cm length x 0.9cm width x 0.1cm depth; 0.064cm^3 volume. Post procedure Diagnosis Wound #2: Same as Pre-Procedure Plan Wound Cleansing: Wound #1 Left,Proximal,Anterior Lower Leg: Clean wound with Normal Saline. Wound #2 Left,Distal,Anterior Lower Leg: Clean wound with Normal Saline. Anesthetic: Wound #1 Left,Proximal,Anterior Lower Leg: Topical Lidocaine 4% cream applied to wound bed prior to debridement Wound #2 Left,Distal,Anterior Lower Leg: Topical Lidocaine 4% cream applied to wound bed prior to debridement Skin Barriers/Peri-Wound Care: Wound #1 Left,Proximal,Anterior Lower Leg: Skin Prep Wound #2 Left,Distal,Anterior Lower Leg: Skin  Prep Primary Wound Dressing: Wound #1  Left,Proximal,Anterior Lower Leg: Medihoney gel Wound #2 Left,Distal,Anterior Lower Leg: NEKITA, MERWIN ([id number]) Medihoney gel Secondary Dressing: Wound #1 Left,Proximal,Anterior Lower Leg: Boardered Foam Dressing Wound #2 Left,Distal,Anterior Lower Leg: Boardered Foam Dressing Dressing Change Frequency: Wound #1 Left,Proximal,Anterior Lower Leg: Change dressing every day. Wound #2 Left,Distal,Anterior Lower Leg: Change dressing every day. Follow-up Appointments: Wound #1 Left,Proximal,Anterior Lower Leg: Return Appointment in 1 week. Wound #2 Left,Distal,Anterior Lower Leg: Return Appointment in 1 week. Edema Control: Wound #1 Left,Proximal,Anterior Lower Leg: Patient to wear own compression stockings Elevate legs to the level of the heart and pump ankles as often as possible Wound #2 Left,Distal,Anterior Lower Leg: Patient to wear own compression stockings Elevate legs to the level of the heart and pump ankles as often as possible Additional Orders / Instructions: Wound #1 Left,Proximal,Anterior Lower Leg: Stop Smoking Wound #2 Left,Distal,Anterior Lower Leg: Stop Smoking I have recommended Medihoney to be applied daily with a bordered foam dressing and for her to use compression stockings of the 20-30 mm variety.he has not been doing this regularly and I have discussed the need to be very compliant starting first thing in the morning and taking them out lasting at night. We have also discussed elevation in bed with 2 pillows and appropriate exercises. She says she is going to be compliant in doing this on her reports the next week. Electronic Signature(s) Signed: 05/14/2015 2:55:33 PM By: Christin Fudge MD, FACS Previous Signature: 05/13/2015 9:06:47 AM Version By: Christin Fudge MD, FACS Entered By: Christin Fudge on 05/14/2015 14:55:33 Jane Short ([id number]) -------------------------------------------------------------------------------- SuperBill  Details Patient Name: Jane Short Date of Service: 05/13/2015 Medical Record Number: [id number] Patient Account Number: [id number] Date of Birth/Sex: 1950/12/25 (64 y.o. Female) Treating RN: Carolyne Fiscal, Debi Primary Care Physician: Ronette Deter Other Clinician: Referring Physician: Ronette Deter Treating Physician/Extender: Frann Rider in Treatment: 4 Diagnosis Coding ICD-10 Codes Code Description I89.0 Lymphedema, not elsewhere classified 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 with fat layer exposed [id number] Lichen planus, unspecified D57.1 Sickle-cell disease without crisis N18.9 Chronic kidney disease, unspecified F17.218 Nicotine dependence, cigarettes, with other nicotine-induced disorders Facility Procedures CPT4 Code: [id number] Description: [id number] - DEB SUBQ TISSUE 20 SQ CM/< ICD-10 Description Diagnosis I89.0 Lymphedema, not elsewhere classified 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 with fat l [id number] Lichen planus, unspecified Modifier: ayer exposed Quantity: 1 Physician Procedures CPT4 [id number] Description: [id number] - WC PHYS LEVEL 3 - EST PT ICD-10 Description Diagnosis I89.0 Lymphedema, not elsewhere classified 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 ([id number]) Modifier: ayer exposed Quantity: 1 Electronic Signature(s) Signed: 05/13/2015 9:07:21 AM By: Christin Fudge MD, FACS Entered By: Christin Fudge on 05/13/2015 09:07:21

## 2015-05-15 ENCOUNTER — Inpatient Hospital Stay: Payer: BLUE CROSS/BLUE SHIELD

## 2015-05-15 VITALS — BP 106/64

## 2015-05-15 DIAGNOSIS — D631 Anemia in chronic kidney disease: Secondary | ICD-10-CM

## 2015-05-15 DIAGNOSIS — N189 Chronic kidney disease, unspecified: Principal | ICD-10-CM

## 2015-05-15 DIAGNOSIS — D571 Sickle-cell disease without crisis: Secondary | ICD-10-CM | POA: Diagnosis not present

## 2015-05-15 LAB — SAMPLE TO BLOOD BANK

## 2015-05-15 LAB — HEMOGLOBIN: Hemoglobin: 6.6 g/dL — ABNORMAL LOW (ref 12.0–16.0)

## 2015-05-15 MED ORDER — EPOETIN ALFA 40000 UNIT/ML IJ SOLN
40000.0000 [IU] | Freq: Once | INTRAMUSCULAR | Status: AC
  Administered 2015-05-15: 40000 [IU] via SUBCUTANEOUS
  Filled 2015-05-15: qty 1

## 2015-05-15 NOTE — Progress Notes (Signed)
Jane Short, Jane Short ([id number]) Visit Report for 05/13/2015 Arrival Information Details Patient Name: Jane Short, Jane Short. Date of Service: 05/13/2015 8:00 AM Medical Record Number: [id number] Patient Account Number: [id number] Date of Birth/Sex: December 06, 1950 (64 y.o. Female) Treating RN: Ahmed Prima Primary Care Physician: Ronette Deter Other Clinician: Referring Physician: Ronette Deter Treating Physician/Extender: Frann Rider in Treatment: 4 Visit Information History Since Last Visit All ordered tests and consults were completed: No Patient Arrived: Ambulatory Added or deleted any medications: No Arrival Time: 08:34 Any new allergies or adverse reactions: No Accompanied By: self Had a fall or experienced change in No Transfer Assistance: None activities of daily living that may affect Patient Identification Verified: Yes risk of falls: Secondary Verification Process Yes Signs or symptoms of abuse/neglect since last No Completed: visito Patient Requires Transmission-Based No Hospitalized since last visit: No Precautions: Pain Present Now: No Patient Has Alerts: No Electronic Signature(s) Signed: 05/14/2015 3:59:50 PM By: Alric Quan Entered By: Alric Quan on 05/13/2015 08:34:19 Jane Short ([id number]) -------------------------------------------------------------------------------- Encounter Discharge Information Details Patient Name: Jane Short Date of Service: 05/13/2015 8:00 AM Medical Record Number: [id number] Patient Account Number: [id number] Date of Birth/Sex: 12-01-1950 (64 y.o. Female) Treating RN: Ahmed Prima Primary Care Physician: Ronette Deter Other Clinician: Referring Physician: Ronette Deter Treating Physician/Extender: Frann Rider in Treatment: 4 Encounter Discharge Information Items Discharge Pain Level: 0 Discharge Condition: Stable Ambulatory Status: Ambulatory Discharge Destination:  Home Transportation: Private Auto Accompanied By: self Schedule Follow-up Appointment: Yes Medication Reconciliation completed and provided to Patient/Care Yes Lacee Grey: Provided on Clinical Summary of Care: 05/13/2015 Form Type Recipient Paper Patient JW Electronic Signature(s) Signed: 05/13/2015 9:10:08 AM By: Ruthine Dose Entered By: Ruthine Dose on 05/13/2015 09:10:08 Jane Short ([id number]) -------------------------------------------------------------------------------- Lower Extremity Assessment Details Patient Name: Jane Short Date of Service: 05/13/2015 8:00 AM Medical Record Number: [id number] Patient Account Number: [id number] Date of Birth/Sex: August 03, 1950 (64 y.o. Female) Treating RN: Ahmed Prima Primary Care Physician: Ronette Deter Other Clinician: Referring Physician: Ronette Deter Treating Physician/Extender: Frann Rider in Treatment: 4 Edema Assessment Assessed: [Left: No] [Right: No] E[Left: dema] [Right: :] Calf Left: Right: Point of Measurement: 34 cm From Medial Instep 40.5 cm cm Ankle Left: Right: Point of Measurement: 10 cm From Medial Instep 19.4 cm cm Vascular Assessment Pulses: Posterior Tibial Dorsalis Pedis Palpable: [Left:Yes] Extremity colors, hair growth, and conditions: Extremity Color: [Left:Normal] Hair Growth on Extremity: [Left:No] Temperature of Extremity: [Left:Warm] Capillary Refill: [Left:< 3 seconds] Toe Nail Assessment Left: Right: Thick: No Discolored: No Deformed: No Improper Length and Hygiene: No Electronic Signature(s) Signed: 05/14/2015 3:59:50 PM By: Alric Quan Entered By: Alric Quan on 05/13/2015 08:40:56 Jane Short ([id number]) -------------------------------------------------------------------------------- Multi Wound Chart Details Patient Name: Jane Short Date of Service: 05/13/2015 8:00 AM Medical Record Number: [id number] Patient Account Number:  [id number] Date of Birth/Sex: 1950-08-29 (64 y.o. Female) Treating RN: Ahmed Prima Primary Care Physician: Ronette Deter Other Clinician: Referring Physician: Ronette Deter Treating Physician/Extender: Frann Rider in Treatment: 4 Vital Signs Height(in): 66 Pulse(bpm): 96 Weight(lbs): 198 Blood Pressure 119/68 (mmHg): Body Mass Index(BMI): 32 Temperature(F): 98.0 Respiratory Rate 18 (breaths/min): Photos: [1:No Photos] [2:No Photos] [N/A:N/A] Wound Location: [1:Left Lower Leg - Anterior, Proximal] [2:Left Lower Leg - Anterior, Distal] [N/A:N/A] Wounding Event: [1:Gradually Appeared] [2:Gradually Appeared] [N/A:N/A] Primary Etiology: [1:Lymphedema] [2:Lymphedema] [N/A:N/A] Comorbid History: [1:Sickle Cell Disease, Chronic Obstructive Pulmonary Disease (COPD), Hypertension, Gout] [2:Sickle Cell Disease, Chronic Obstructive Pulmonary Disease (COPD), Hypertension, Gout] [N/A:N/A] Date Acquired: [1:03/18/2015] [2:03/18/2015] [N/A:N/A] Weeks of Treatment: [1:4] [  2:4] [N/A:N/A] Wound Status: [1:Open] [2:Open] [N/A:N/A] Measurements L x W x D 1x1.5x0.2 [2:0.9x0.9x0.1] [N/A:N/A] (cm) Area (cm) : [1:1.178] [2:0.636] [N/A:N/A] Volume (cm) : [1:0.236] [2:0.064] [N/A:N/A] % Reduction in Area: [1:-15.40%] [2:-238.30%] [N/A:N/A] % Reduction in Volume: -15.70% [2:-236.80%] [N/A:N/A] Classification: [1:Full Thickness Without Exposed Support Structures] [2:Full Thickness Without Exposed Support Structures] [N/A:N/A] Exudate Amount: [1:Small] [2:Medium] [N/A:N/A] Exudate Type: [1:Serous] [2:Serous] [N/A:N/A] Exudate Color: [1:amber] [2:amber] [N/A:N/A] Wound Margin: [1:Thickened] [2:Thickened] [N/A:N/A] Granulation Amount: [1:None Present (0%)] [2:None Present (0%)] [N/A:N/A] Necrotic Amount: [1:Large (67-100%)] [2:Large (67-100%)] [N/A:N/A] Exposed Structures: [1:Fascia: No Fat: No] [2:Fascia: No Fat: No] [N/A:N/A] Tendon: No Tendon: No Muscle: No Muscle: No Joint:  No Joint: No Bone: No Bone: No Limited to Skin Limited to Skin Breakdown Breakdown Epithelialization: None None N/A Periwound Skin Texture: Scarring: Yes Scarring: Yes N/A Periwound Skin Moist: Yes Moist: Yes N/A Moisture: Periwound Skin Color: No Abnormalities Noted No Abnormalities Noted N/A Temperature: No Abnormality No Abnormality N/A Tenderness on No No N/A Palpation: Wound Preparation: Ulcer Cleansing: Ulcer Cleansing: N/A Rinsed/Irrigated with Rinsed/Irrigated with Saline Saline Topical Anesthetic Topical Anesthetic Applied: Other: lidocaine Applied: Other: lidocaine 4% 4% Treatment Notes Electronic Signature(s) Signed: 05/14/2015 3:59:50 PM By: Alric Quan Entered By: Alric Quan on 05/13/2015 08:44:03 Jane Short ([id number]) -------------------------------------------------------------------------------- [place] Details Patient Name: Jane Short Date of Service: 05/13/2015 8:00 AM Medical Record Number: [id number] Patient Account Number: [id number] Date of Birth/Sex: 07/31/1950 (64 y.o. Female) Treating RN: Carolyne Fiscal, Debi Primary Care Physician: Ronette Deter Other Clinician: Referring Physician: Ronette Deter Treating Physician/Extender: Frann Rider in Treatment: 4 Active Inactive Abuse / Safety / Falls / Self Care Management Nursing Diagnoses: Impaired physical mobility Potential for falls Goals: Patient will remain injury free Date Initiated: 04/15/2015 Goal Status: Active Interventions: Assess fall risk on admission and as needed Notes: has not had any falls since admission to wound center Orientation to the Wound Care Program Nursing Diagnoses: Knowledge deficit related to the wound healing center program Goals: Patient/caregiver will verbalize understanding of the [place] [facility] Date Initiated: 04/15/2015 Goal Status: Active Interventions: Provide education on orientation  to the wound center Notes: Wound/Skin Impairment Nursing Diagnoses: Impaired tissue integrity Goals: Jane Short, Jane Short ([id number]) Patient/caregiver will verbalize understanding of skin care regimen Date Initiated: 04/15/2015 Goal Status: Active Ulcer/skin breakdown will have a volume reduction of 30% by week 4 Date Initiated: 04/15/2015 Goal Status: Active Ulcer/skin breakdown will have a volume reduction of 50% by week 8 Date Initiated: 04/15/2015 Goal Status: Active Ulcer/skin breakdown will have a volume reduction of 80% by week 12 Date Initiated: 04/15/2015 Goal Status: Active Ulcer/skin breakdown will heal within 14 weeks Date Initiated: 04/15/2015 Goal Status: Active Interventions: Assess ulceration(s) every visit Notes: Electronic Signature(s) Signed: 05/14/2015 3:59:50 PM By: Alric Quan Entered By: Alric Quan on 05/13/2015 08:43:56 Jane Short ([id number]) -------------------------------------------------------------------------------- Pain Assessment Details Patient Name: Jane Short Date of Service: 05/13/2015 8:00 AM Medical Record Number: [id number] Patient Account Number: [id number] Date of Birth/Sex: 04-13-1951 (64 y.o. Female) Treating RN: Ahmed Prima Primary Care Physician: Ronette Deter Other Clinician: Referring Physician: Ronette Deter Treating Physician/Extender: Frann Rider in Treatment: 4 Active Problems Location of Pain Severity and Description of Pain Patient Has Paino No Site Locations Pain Management and Medication Current Pain Management: Electronic Signature(s) Signed: 05/14/2015 3:59:50 PM By: Alric Quan Entered By: Alric Quan on 05/13/2015 08:34:26 Jane Short ([id number]) -------------------------------------------------------------------------------- Patient/Caregiver Education Details Patient Name: Jane Short Date of Service: 05/13/2015 8:00 AM Medical Record  Number:  [id number] Patient Account Number: [id number] Date of Birth/Gender: 11-30-1950 (64 y.o. Female) Treating RN: Ahmed Prima Primary Care Physician: Ronette Deter Other Clinician: Referring Physician: Ronette Deter Treating Physician/Extender: Frann Rider in Treatment: 4 Education Assessment Education Provided To: Patient Education Topics Provided Wound/Skin Impairment: Handouts: Other: change dressings as ordered Methods: Demonstration, Explain/Verbal Responses: State content correctly Electronic Signature(s) Signed: 05/14/2015 3:59:50 PM By: Alric Quan Entered By: Alric Quan on 05/13/2015 08:58:57 Jane Short ([id number]) -------------------------------------------------------------------------------- Wound Assessment Details Patient Name: Jane Short Date of Service: 05/13/2015 8:00 AM Medical Record Number: [id number] Patient Account Number: [id number] Date of Birth/Sex: [birth year] (64 y.o. Female) Treating RN: Carolyne Fiscal, Debi Primary Care Physician: Ronette Deter Other Clinician: Referring Physician: Ronette Deter Treating Physician/Extender: Frann Rider in Treatment: 4 Wound Status Wound Number: 1 Primary Lymphedema Etiology: Wound Location: Left Lower Leg - Anterior, Proximal Wound Open Status: Wounding Event: Gradually Appeared Comorbid Sickle Cell Disease, Chronic Date Acquired: 03/18/2015 History: Obstructive Pulmonary Disease Weeks Of Treatment: 4 (COPD), Hypertension, Gout Clustered Wound: No Photos Photo Uploaded By: Gretta Cool, RN, BSN, Kim on 05/13/2015 15:34:42 Wound Measurements Length: (cm) 1 Width: (cm) 1.5 Depth: (cm) 0.2 Area: (cm) 1.178 Volume: (cm) 0.236 % Reduction in Area: -15.4% % Reduction in Volume: -15.7% Epithelialization: None Tunneling: No Undermining: No Wound Description Full Thickness Without Exposed Classification: Support Structures Wound Margin:  Thickened Exudate Small Amount: Exudate Type: Serous Exudate Color: amber Foul Odor After Cleansing: No Wound Bed Granulation Amount: None Present (0%) Exposed Structure Necrotic Amount: Large (67-100%) Fascia Exposed: No Jane Short, Jane Short ([id number]) Necrotic Quality: Adherent Slough Fat Layer Exposed: No Tendon Exposed: No Muscle Exposed: No Joint Exposed: No Bone Exposed: No Limited to Skin Breakdown Periwound Skin Texture Texture Color No Abnormalities Noted: No No Abnormalities Noted: No Scarring: Yes Temperature / Pain Moisture Temperature: No Abnormality No Abnormalities Noted: No Moist: Yes Wound Preparation Ulcer Cleansing: Rinsed/Irrigated with Saline Topical Anesthetic Applied: Other: lidocaine 4%, Treatment Notes Wound #1 (Left, Proximal, Anterior Lower Leg) 1. Cleansed with: Clean wound with Normal Saline 3. Peri-wound Care: Skin Prep 4. Dressing Applied: Medihoney Gel 5. Secondary Dressing Applied Bordered Foam Dressing Electronic Signature(s) Signed: 05/14/2015 3:59:50 PM By: Alric Quan Entered By: Alric Quan on 05/13/2015 08:43:49 Jane Short ([id number]) -------------------------------------------------------------------------------- Wound Assessment Details Patient Name: Jane Short Date of Service: 05/13/2015 8:00 AM Medical Record Number: [id number] Patient Account Number: [id number] Date of Birth/Sex: [birth year] (64 y.o. Female) Treating RN: Carolyne Fiscal, Debi Primary Care Physician: Ronette Deter Other Clinician: Referring Physician: Ronette Deter Treating Physician/Extender: Frann Rider in Treatment: 4 Wound Status Wound Number: 2 Primary Lymphedema Etiology: Wound Location: Left Lower Leg - Anterior, Distal Wound Open Status: Wounding Event: Gradually Appeared Comorbid Sickle Cell Disease, Chronic Date Acquired: 03/18/2015 History: Obstructive Pulmonary Disease Weeks Of Treatment: 4 (COPD),  Hypertension, Gout Clustered Wound: No Photos Photo Uploaded By: Gretta Cool, RN, BSN, Kim on 05/13/2015 15:34:42 Wound Measurements Length: (cm) 0.9 Width: (cm) 0.9 Depth: (cm) 0.1 Area: (cm) 0.636 Volume: (cm) 0.064 % Reduction in Area: -238.3% % Reduction in Volume: -236.8% Epithelialization: None Tunneling: No Undermining: No Wound Description Full Thickness Without Exposed Classification: Support Structures Wound Margin: Thickened Exudate Medium Amount: Exudate Type: Serous Exudate Color: amber Foul Odor After Cleansing: No Wound Bed Granulation Amount: None Present (0%) Exposed Structure Necrotic Amount: Large (67-100%) Fascia Exposed: No Jane Short, Jane Short ([id number]) Necrotic Quality: Adherent Slough Fat Layer Exposed: No Tendon Exposed: No Muscle Exposed: No Joint Exposed: No Bone Exposed: No Limited to  Skin Breakdown Periwound Skin Texture Texture Color No Abnormalities Noted: No No Abnormalities Noted: No Scarring: Yes Temperature / Pain Moisture Temperature: No Abnormality No Abnormalities Noted: No Moist: Yes Wound Preparation Ulcer Cleansing: Rinsed/Irrigated with Saline Topical Anesthetic Applied: Other: lidocaine 4%, Treatment Notes Wound #2 (Left, Distal, Anterior Lower Leg) 1. Cleansed with: Clean wound with Normal Saline 3. Peri-wound Care: Skin Prep 4. Dressing Applied: Medihoney Gel 5. Secondary Dressing Applied Bordered Foam Dressing Electronic Signature(s) Signed: 05/14/2015 3:59:50 PM By: Alric Quan Entered By: Alric Quan on 05/13/2015 08:43:34 Jane Short ([id number]) -------------------------------------------------------------------------------- [place] Details Patient Name: Jane Short Date of Service: 05/13/2015 8:00 AM Medical Record Number: [id number] Patient Account Number: [id number] Date of Birth/Sex: 08/29/1950 (64 y.o. Female) Treating RN: Carolyne Fiscal, Debi Primary Care Physician: Ronette Deter Other Clinician: Referring Physician: Ronette Deter Treating Physician/Extender: Frann Rider in Treatment: 4 Vital Signs Time Taken: 08:34 Temperature (F): 98.0 Height (in): 66 Pulse (bpm): 96 Weight (lbs): 198 Respiratory Rate (breaths/min): 18 Body Mass Index (BMI): 32 Blood Pressure (mmHg): 119/68 Reference Range: 80 - 120 mg / dl Electronic Signature(s) Signed: 05/14/2015 3:59:50 PM By: Alric Quan Entered By: Alric Quan on 05/13/2015 08:38:21

## 2015-05-17 ENCOUNTER — Ambulatory Visit
Admission: RE | Admit: 2015-05-17 | Discharge: 2015-05-17 | Disposition: A | Discharge status: Home / Self Care | Payer: BLUE CROSS/BLUE SHIELD | Source: Ambulatory Visit | Attending: General Surgery | Admitting: General Surgery

## 2015-05-17 DIAGNOSIS — I517 Cardiomegaly: Secondary | ICD-10-CM | POA: Insufficient documentation

## 2015-05-17 DIAGNOSIS — K439 Ventral hernia without obstruction or gangrene: Secondary | ICD-10-CM | POA: Diagnosis present

## 2015-05-17 DIAGNOSIS — K429 Umbilical hernia without obstruction or gangrene: Secondary | ICD-10-CM | POA: Diagnosis not present

## 2015-05-17 DIAGNOSIS — K219 Gastro-esophageal reflux disease without esophagitis: Secondary | ICD-10-CM | POA: Insufficient documentation

## 2015-05-20 ENCOUNTER — Other Ambulatory Visit: Payer: Self-pay | Admitting: Family Medicine

## 2015-05-20 ENCOUNTER — Encounter: Payer: BLUE CROSS/BLUE SHIELD | Admitting: Surgery

## 2015-05-20 DIAGNOSIS — L97222 Non-pressure chronic ulcer of left calf with fat layer exposed: Secondary | ICD-10-CM | POA: Diagnosis not present

## 2015-05-21 ENCOUNTER — Telehealth: Payer: Self-pay

## 2015-05-21 NOTE — Telephone Encounter (Signed)
-----   Message from Christene Lye, MD sent at 05/21/2015  8:21 AM EST ----- Need to se pt to discuss CT findings

## 2015-05-21 NOTE — Telephone Encounter (Signed)
Message left for patient to call back and schedule a follow up to discuss CT results.

## 2015-05-22 ENCOUNTER — Inpatient Hospital Stay: Payer: BLUE CROSS/BLUE SHIELD

## 2015-05-22 VITALS — BP 122/72 | HR 89 | Temp 98.6°F | Resp 18

## 2015-05-22 DIAGNOSIS — N189 Chronic kidney disease, unspecified: Principal | ICD-10-CM

## 2015-05-22 DIAGNOSIS — D571 Sickle-cell disease without crisis: Secondary | ICD-10-CM | POA: Diagnosis not present

## 2015-05-22 DIAGNOSIS — D631 Anemia in chronic kidney disease: Secondary | ICD-10-CM

## 2015-05-22 LAB — HEMOGLOBIN: Hemoglobin: 6.2 g/dL — ABNORMAL LOW (ref 12.0–16.0)

## 2015-05-22 MED ORDER — EPOETIN ALFA 40000 UNIT/ML IJ SOLN
40000.0000 [IU] | Freq: Once | INTRAMUSCULAR | Status: AC
  Administered 2015-05-22: 40000 [IU] via SUBCUTANEOUS
  Filled 2015-05-22: qty 1

## 2015-05-22 NOTE — Telephone Encounter (Signed)
-----   Message from Christene Lye, MD sent at 05/21/2015  8:21 AM EST ----- Need to se pt to discuss CT findings

## 2015-05-22 NOTE — Telephone Encounter (Signed)
Notified patient as instructed, patient pleased. Discussed follow-up appointment for 05/30/15 at 9:15 am, patient agrees

## 2015-05-23 NOTE — Progress Notes (Signed)
EULAR, CHRISTOS ([id number]) Visit Report for 05/20/2015 Arrival Information Details Patient Name: Jane Short, Jane Short. Date of Service: 05/20/2015 8:00 AM Medical Record Number: [id number] Patient Account Number: [id number] Date of Birth/Sex: [birth year] (64 y.o. Female) Treating RN: Ahmed Prima Primary Care Physician: Ronette Deter Other Clinician: Referring Physician: Ronette Deter Treating Physician/Extender: Frann Rider in Treatment: 5 Visit Information History Since Last Visit All ordered tests and consults were completed: No Patient Arrived: Ambulatory Added or deleted any medications: No Arrival Time: 08:06 Any new allergies or adverse reactions: No Accompanied By: self Had a fall or experienced change in No Transfer Assistance: None activities of daily living that may affect Patient Identification Verified: Yes risk of falls: Secondary Verification Process Yes Signs or symptoms of abuse/neglect since last No Completed: visito Patient Requires Transmission-Based No Hospitalized since last visit: No Precautions: Pain Present Now: No Patient Has Alerts: No Electronic Signature(s) Signed: 05/22/2015 4:19:08 PM By: Alric Quan Entered By: Alric Quan on 05/20/2015 08:07:12 Jane Short ([id number]) -------------------------------------------------------------------------------- Encounter Discharge Information Details Patient Name: Jane Short Date of Service: 05/20/2015 8:00 AM Medical Record Number: [id number] Patient Account Number: [id number] Date of Birth/Sex: 1950/06/09 (64 y.o. Female) Treating RN: Ahmed Prima Primary Care Physician: Ronette Deter Other Clinician: Referring Physician: Ronette Deter Treating Physician/Extender: Frann Rider in Treatment: 5 Encounter Discharge Information Items Discharge Pain Level: 0 Discharge Condition: Stable Ambulatory Status: Ambulatory Discharge Destination:  Home Transportation: Private Auto Accompanied By: self Schedule Follow-up Appointment: Yes Medication Reconciliation completed and provided to Patient/Care Yes Alexandr Oehler: Provided on Clinical Summary of Care: 05/20/2015 Form Type Recipient Paper Patient JW Electronic Signature(s) Signed: 05/20/2015 8:46:53 AM By: Ruthine Dose Entered By: Ruthine Dose on 05/20/2015 08:46:53 Jane Short ([id number]) -------------------------------------------------------------------------------- Lower Extremity Assessment Details Patient Name: Jane Short Date of Service: 05/20/2015 8:00 AM Medical Record Number: [id number] Patient Account Number: [id number] Date of Birth/Sex: 14-Aug-1950 (64 y.o. Female) Treating RN: Carolyne Fiscal, Debi Primary Care Physician: Ronette Deter Other Clinician: Referring Physician: Ronette Deter Treating Physician/Extender: Frann Rider in Treatment: 5 Edema Assessment Assessed: [Left: No] [Right: No] E[Left: dema] [Right: :] Calf Left: Right: Point of Measurement: cm From Medial Instep 39.2 cm cm Ankle Left: Right: Point of Measurement: cm From Medial Instep 19 cm cm Vascular Assessment Pulses: Posterior Tibial Dorsalis Pedis Palpable: [Left:Yes] Extremity colors, hair growth, and conditions: Extremity Color: [Left:Normal] Hair Growth on Extremity: [Left:No] Temperature of Extremity: [Left:Warm] Capillary Refill: [Left:< 3 seconds] Toe Nail Assessment Left: Right: Thick: No Discolored: No Deformed: No Improper Length and Hygiene: No Electronic Signature(s) Signed: 05/22/2015 4:19:08 PM By: Alric Quan Entered By: Alric Quan on 05/20/2015 08:14:29 Jane Short ([id number]) -------------------------------------------------------------------------------- Multi Wound Chart Details Patient Name: Jane Short Date of Service: 05/20/2015 8:00 AM Medical Record Number: [id number] Patient Account Number:  [id number] Date of Birth/Sex: Nov 01, 1950 (64 y.o. Female) Treating RN: Ahmed Prima Primary Care Physician: Ronette Deter Other Clinician: Referring Physician: Ronette Deter Treating Physician/Extender: Frann Rider in Treatment: 5 Vital Signs Height(in): 66 Pulse(bpm): 85 Weight(lbs): 198 Blood Pressure 103/52 (mmHg): Body Mass Index(BMI): 32 Temperature(F): 98.0 Respiratory Rate 18 (breaths/min): Photos: [1:No Photos] [2:No Photos] [N/A:N/A] Wound Location: [1:Left Lower Leg - Anterior, Proximal] [2:Left Lower Leg - Anterior, Distal] [N/A:N/A] Wounding Event: [1:Gradually Appeared] [2:Gradually Appeared] [N/A:N/A] Primary Etiology: [1:Lymphedema] [2:Lymphedema] [N/A:N/A] Comorbid History: [1:Sickle Cell Disease, Chronic Obstructive Pulmonary Disease (COPD), Hypertension, Gout] [2:Sickle Cell Disease, Chronic Obstructive Pulmonary Disease (COPD), Hypertension, Gout] [N/A:N/A] Date Acquired: [1:03/18/2015] [2:03/18/2015] [N/A:N/A] Weeks of Treatment: [1:5] [2:5] [N/A:N/A]  Wound Status: [1:Open] [2:Open] [N/A:N/A] Measurements L x W x D 1.2x1.5x0.2 [2:0.9x0.8x0.1] [N/A:N/A] (cm) Area (cm) : [1:1.414] [2:0.565] [N/A:N/A] Volume (cm) : [1:0.283] [2:0.057] [N/A:N/A] % Reduction in Area: [1:-38.50%] [2:-200.50%] [N/A:N/A] % Reduction in Volume: -38.70% [2:-200.00%] [N/A:N/A] Classification: [1:Full Thickness Without Exposed Support Structures] [2:Full Thickness Without Exposed Support Structures] [N/A:N/A] Exudate Amount: [1:Medium] [2:Medium] [N/A:N/A] Exudate Type: [1:Serous] [2:Serous] [N/A:N/A] Exudate Color: [1:amber] [2:amber] [N/A:N/A] Wound Margin: [1:Thickened] [2:Thickened] [N/A:N/A] Granulation Amount: [1:None Present (0%)] [2:None Present (0%)] [N/A:N/A] Necrotic Amount: [1:Large (67-100%)] [2:Large (67-100%)] [N/A:N/A] Exposed Structures: [1:Fascia: No Fat: No] [2:Fascia: No Fat: No] [N/A:N/A] Tendon: No Tendon: No Muscle: No Muscle: No Joint:  No Joint: No Bone: No Bone: No Limited to Skin Limited to Skin Breakdown Breakdown Epithelialization: None None N/A Periwound Skin Texture: Scarring: Yes Scarring: Yes N/A Periwound Skin Moist: Yes Moist: Yes N/A Moisture: Periwound Skin Color: No Abnormalities Noted No Abnormalities Noted N/A Temperature: No Abnormality No Abnormality N/A Tenderness on No No N/A Palpation: Wound Preparation: Ulcer Cleansing: Ulcer Cleansing: N/A Rinsed/Irrigated with Rinsed/Irrigated with Saline Saline Topical Anesthetic Topical Anesthetic Applied: Other: lidocaine Applied: Other: lidocaine 4% 4% Treatment Notes Electronic Signature(s) Signed: 05/22/2015 4:19:08 PM By: Alric Quan Entered By: Alric Quan on 05/20/2015 08:20:08 Jane Short ([id number]) -------------------------------------------------------------------------------- [place] Details Patient Name: Jane Short Date of Service: 05/20/2015 8:00 AM Medical Record Number: [id number] Patient Account Number: [id number] Date of Birth/Sex: [birth year] (64 y.o. Female) Treating RN: Carolyne Fiscal, Debi Primary Care Physician: Ronette Deter Other Clinician: Referring Physician: Ronette Deter Treating Physician/Extender: Frann Rider in Treatment: 5 Active Inactive Abuse / Safety / Falls / Self Care Management Nursing Diagnoses: Impaired physical mobility Potential for falls Goals: Patient will remain injury free Date Initiated: 04/15/2015 Goal Status: Active Interventions: Assess fall risk on admission and as needed Notes: has not had any falls since admission to wound center Orientation to the Wound Care Program Nursing Diagnoses: Knowledge deficit related to the wound healing center program Goals: Patient/caregiver will verbalize understanding of the [place] Program Date Initiated: 04/15/2015 Goal Status: Active Interventions: Provide education on orientation  to the wound center Notes: Wound/Skin Impairment Nursing Diagnoses: Impaired tissue integrity Goals: Jane Short, Jane Short ([id number]) Patient/caregiver will verbalize understanding of skin care regimen Date Initiated: 04/15/2015 Goal Status: Active Ulcer/skin breakdown will have a volume reduction of 30% by week 4 Date Initiated: 04/15/2015 Goal Status: Active Ulcer/skin breakdown will have a volume reduction of 50% by week 8 Date Initiated: 04/15/2015 Goal Status: Active Ulcer/skin breakdown will have a volume reduction of 80% by week 12 Date Initiated: 04/15/2015 Goal Status: Active Ulcer/skin breakdown will heal within 14 weeks Date Initiated: 04/15/2015 Goal Status: Active Interventions: Assess ulceration(s) every visit Notes: Electronic Signature(s) Signed: 05/22/2015 4:19:08 PM By: Alric Quan Entered By: Alric Quan on 05/20/2015 08:18:51 Jane Short ([id number]) -------------------------------------------------------------------------------- Pain Assessment Details Patient Name: Jane Short Date of Service: 05/20/2015 8:00 AM Medical Record Number: [id number] Patient Account Number: [id number] Date of Birth/Sex: 1950/07/27 (64 y.o. Female) Treating RN: Ahmed Prima Primary Care Physician: Ronette Deter Other Clinician: Referring Physician: Ronette Deter Treating Physician/Extender: Frann Rider in Treatment: 5 Active Problems Location of Pain Severity and Description of Pain Patient Has Paino No Site Locations Pain Management and Medication Current Pain Management: Electronic Signature(s) Signed: 05/22/2015 4:19:08 PM By: Alric Quan Entered By: Alric Quan on 05/20/2015 08:07:25 Jane Short ([id number]) -------------------------------------------------------------------------------- Patient/Caregiver Education Details Patient Name: Jane Short Date of Service: 05/20/2015 8:00 AM Medical Record Number:  [id number] Patient Account Number: [id number] Date of Birth/Gender: 08/25/1950 (64 y.o. Female) Treating RN: Ahmed Prima Primary Care Physician: Ronette Deter Other Clinician: Referring Physician: Ronette Deter Treating Physician/Extender: Frann Rider in Treatment: 5 Education Assessment Education Provided To: Patient Education Topics Provided Wound/Skin Impairment: Handouts: Other: change dressings as ordered Methods: Demonstration, Explain/Verbal Responses: State content correctly Electronic Signature(s) Signed: 05/22/2015 4:19:08 PM By: Alric Quan Entered By: Alric Quan on 05/20/2015 08:40:59 Jane Short ([id number]) -------------------------------------------------------------------------------- Wound Assessment Details Patient Name: Jane Short Date of Service: 05/20/2015 8:00 AM Medical Record Number: [id number] Patient Account Number: [id number] Date of Birth/Sex: 08-May-1951 (64 y.o. Female) Treating RN: Carolyne Fiscal, Debi Primary Care Physician: Ronette Deter Other Clinician: Referring Physician: Ronette Deter Treating Physician/Extender: Frann Rider in Treatment: 5 Wound Status Wound Number: 1 Primary Lymphedema Etiology: Wound Location: Left Lower Leg - Anterior, Proximal Wound Open Status: Wounding Event: Gradually Appeared Comorbid Sickle Cell Disease, Chronic Date Acquired: 03/18/2015 History: Obstructive Pulmonary Disease Weeks Of Treatment: 5 (COPD), Hypertension, Gout Clustered Wound: No Wound Measurements Length: (cm) 1.2 Width: (cm) 1.5 Depth: (cm) 0.2 Area: (cm) 1.414 Volume: (cm) 0.283 % Reduction in Area: -38.5% % Reduction in Volume: -38.7% Epithelialization: None Tunneling: No Undermining: No Wound Description Full Thickness Without Exposed Classification: Support Structures Wound Margin: Thickened Exudate Medium Amount: Exudate Type: Serous Exudate Color: amber Foul Odor  After Cleansing: No Wound Bed Granulation Amount: None Present (0%) Exposed Structure Necrotic Amount: Large (67-100%) Fascia Exposed: No Necrotic Quality: Adherent Slough Fat Layer Exposed: No Tendon Exposed: No Muscle Exposed: No Joint Exposed: No Bone Exposed: No Limited to Skin Breakdown Periwound Skin Texture Texture Color No Abnormalities Noted: No No Abnormalities Noted: No Scarring: Yes Temperature / Pain Jane Short, Jane Short. ([id number]) Moisture Temperature: No Abnormality No Abnormalities Noted: No Moist: Yes Wound Preparation Ulcer Cleansing: Rinsed/Irrigated with Saline Topical Anesthetic Applied: Other: lidocaine 4%, Treatment Notes Wound #1 (Left, Proximal, Anterior Lower Leg) 1. Cleansed with: Clean wound with Normal Saline 2. Anesthetic Topical Lidocaine 4% cream to wound bed prior to debridement 4. Dressing Applied: Santyl Ointment 5. Secondary Dressing Applied Bordered Foam Dressing 7. Secured with Other (specify in notes) Notes wear your compression hose Electronic Signature(s) Signed: 05/22/2015 4:19:08 PM By: Alric Quan Entered By: Alric Quan on 05/20/2015 08:16:26 Jane Short ([id number]) -------------------------------------------------------------------------------- Wound Assessment Details Patient Name: Jane Short Date of Service: 05/20/2015 8:00 AM Medical Record Number: [id number] Patient Account Number: [id number] Date of Birth/Sex: [birth year] (64 y.o. Female) Treating RN: Carolyne Fiscal, Debi Primary Care Physician: Ronette Deter Other Clinician: Referring Physician: Ronette Deter Treating Physician/Extender: Frann Rider in Treatment: 5 Wound Status Wound Number: 2 Primary Lymphedema Etiology: Wound Location: Left Lower Leg - Anterior, Distal Wound Open Status: Wounding Event: Gradually Appeared Comorbid Sickle Cell Disease, Chronic Date Acquired: 03/18/2015 History: Obstructive Pulmonary  Disease Weeks Of Treatment: 5 (COPD), Hypertension, Gout Clustered Wound: No Wound Measurements Length: (cm) 0.9 Width: (cm) 0.8 Depth: (cm) 0.1 Area: (cm) 0.565 Volume: (cm) 0.057 % Reduction in Area: -200.5% % Reduction in Volume: -200% Epithelialization: None Tunneling: No Undermining: No Wound Description Full Thickness Without Exposed Classification: Support Structures Wound Margin: Thickened Exudate Medium Amount: Exudate Type: Serous Exudate Color: amber Foul Odor After Cleansing: No Wound Bed Granulation Amount: None Present (0%) Exposed Structure Necrotic Amount: Large (67-100%) Fascia Exposed: No Necrotic Quality: Adherent Slough Fat Layer Exposed: No Tendon Exposed: No Muscle Exposed: No Joint Exposed: No Bone Exposed: No Limited to Skin Breakdown Periwound Skin Texture Texture Color No Abnormalities  Noted: No No Abnormalities Noted: No Scarring: Yes Temperature / Pain Jane Short, Jane Short. ([id number]) Moisture Temperature: No Abnormality No Abnormalities Noted: No Moist: Yes Wound Preparation Ulcer Cleansing: Rinsed/Irrigated with Saline Topical Anesthetic Applied: Other: lidocaine 4%, Treatment Notes Wound #2 (Left, Distal, Anterior Lower Leg) 1. Cleansed with: Clean wound with Normal Saline 2. Anesthetic Topical Lidocaine 4% cream to wound bed prior to debridement 4. Dressing Applied: Santyl Ointment 5. Secondary Dressing Applied Bordered Foam Dressing 7. Secured with Other (specify in notes) Notes wear your compression hose Electronic Signature(s) Signed: 05/22/2015 4:19:08 PM By: Alric Quan Entered By: Alric Quan on 05/20/2015 08:17:29 Jane Short ([id number]) -------------------------------------------------------------------------------- Wound Assessment Details Patient Name: Jane Short Date of Service: 05/20/2015 8:00 AM Medical Record Number: [id number] Patient Account Number: [id number] Date of  Birth/Sex: [birth year] (64 y.o. Female) Treating RN: Carolyne Fiscal, Debi Primary Care Physician: Ronette Deter Other Clinician: Referring Physician: Ronette Deter Treating Physician/Extender: Frann Rider in Treatment: 5 Wound Status Wound Number: 4 Primary Venous Leg Ulcer Etiology: Wound Location: Left Lower Leg - Lateral, Distal Wound Open Wounding Event: Trauma Status: Date Acquired: 05/15/2015 Comorbid Sickle Cell Disease, Chronic Weeks Of Treatment: 0 History: Obstructive Pulmonary Disease Clustered Wound: No (COPD), Hypertension, Gout Wound Measurements Length: (cm) 0.3 % Reductio Width: (cm) 0.3 % Reductio Depth: (cm) 0.2 Area: (cm) 0.071 Volume: (cm) 0.014 n in Area: n in Volume: Wound Description Classification: Partial Thickness Wound Margin: Flat and Intact Exudate Amount: Medium Exudate Type: Serous Exudate Color: amber Foul Odor After Cleansing: No Wound Bed Granulation Amount: None Present (0%) Exposed Structure Necrotic Amount: Large (67-100%) Fascia Exposed: No Necrotic Quality: Adherent Slough Fat Layer Exposed: No Tendon Exposed: No Muscle Exposed: No Joint Exposed: No Bone Exposed: No Limited to Skin Breakdown Periwound Skin Texture Texture Color No Abnormalities Noted: No No Abnormalities Noted: No Moisture Temperature / Pain No Abnormalities Noted: No Temperature: No Abnormality Moist: Yes Tenderness on Palpation: Yes Jane Short, Jane Short ([id number]) Wound Preparation Ulcer Cleansing: Rinsed/Irrigated with Saline Topical Anesthetic Applied: None Treatment Notes Wound #4 (Left, Distal, Lateral Lower Leg) 1. Cleansed with: Clean wound with Normal Saline 2. Anesthetic Topical Lidocaine 4% cream to wound bed prior to debridement 4. Dressing Applied: Santyl Ointment 5. Secondary Dressing Applied Bordered Foam Dressing 7. Secured with Other (specify in notes) Notes wear your compression hose Electronic  Signature(s) Signed: 05/22/2015 4:19:08 PM By: Alric Quan Entered By: Alric Quan on 05/20/2015 08:37:44 Jane Short ([id number]) -------------------------------------------------------------------------------- [place] Details Patient Name: Jane Short Date of Service: 05/20/2015 8:00 AM Medical Record Number: [id number] Patient Account Number: [id number] Date of Birth/Sex: 29-Mar-1951 (64 y.o. Female) Treating RN: Carolyne Fiscal, Debi Primary Care Physician: Ronette Deter Other Clinician: Referring Physician: Ronette Deter Treating Physician/Extender: Frann Rider in Treatment: 5 Vital Signs Time Taken: 08:07 Temperature (F): 98.0 Height (in): 66 Pulse (bpm): 85 Weight (lbs): 198 Respiratory Rate (breaths/min): 18 Body Mass Index (BMI): 32 Blood Pressure (mmHg): 103/52 Reference Range: 80 - 120 mg / dl Electronic Signature(s) Signed: 05/22/2015 4:19:08 PM By: Alric Quan Entered By: Alric Quan on 05/20/2015 08:09:20

## 2015-05-23 NOTE — Progress Notes (Signed)
SEINA, BACILIO ([id number]) Visit Report for 05/20/2015 Chief Complaint Document Details Patient Name: Jane Short, Jane Short 05/20/2015 8:00 Date of Service: AM Medical Record [id number] Number: Patient Account Number: [id number] [birth year] (64 y.o. Treating RN: Ahmed Prima Date of Birth/Sex: Female) Other Clinician: Primary Care Physician: Ronette Deter Treating Christin Fudge Referring Physician: Ronette Deter Physician/Extender: Suella Grove in Treatment: 5 Information Obtained from: Patient Chief Complaint Patient presents to the wound care center for a consult due non healing wound left lower extremity with draining fluid for about 2 months Electronic Signature(s) Signed: 05/20/2015 8:44:41 AM By: Christin Fudge MD, FACS Entered By: Christin Fudge on 05/20/2015 08:44:41 Jane Short ([id number]) -------------------------------------------------------------------------------- Debridement Details Patient Name: Jane Short 05/20/2015 8:00 Date of Service: AM Medical Record [id number] Number: Patient Account Number: [id number] May 09, 1951 (64 y.o. Treating RN: Ahmed Prima Date of Birth/Sex: Female) Other Clinician: Primary Care Physician: Ronette Deter Treating Christin Fudge Referring Physician: Ronette Deter Physician/Extender: Suella Grove in Treatment: 5 Debridement Performed for Wound #1 Left,Proximal,Anterior Lower Leg Assessment: Performed By: Physician Christin Fudge, MD Debridement: Debridement Pre-procedure Yes Verification/Time Out Taken: Start Time: 08:29 Pain Control: Other : lidocaine 4% Level: Skin/Subcutaneous Tissue Total Area Debrided (L x 1.2 (cm) x 1.5 (cm) = 1.8 (cm) W): Tissue and other Viable, Non-Viable, Exudate, Fibrin/Slough, Subcutaneous material debrided: Instrument: Curette Bleeding: Minimum Hemostasis Achieved: Pressure End Time: 08:31 Procedural Pain: 0 Post Procedural Pain: 0 Response to Treatment: Procedure was  tolerated well Post Debridement Measurements of Total Wound Length: (cm) 1.2 Width: (cm) 1.5 Depth: (cm) 0.3 Volume: (cm) 0.424 Post Procedure Diagnosis Same as Pre-procedure Electronic Signature(s) Signed: 05/20/2015 8:44:26 AM By: Christin Fudge MD, FACS Signed: 05/22/2015 4:19:08 PM By: Alric Quan Entered By: Christin Fudge on 05/20/2015 08:44:26 Jane Short ([id number]) -------------------------------------------------------------------------------- Debridement Details Patient Name: Jane Short. 05/20/2015 8:00 Date of Service: AM Medical Record [id number] Number: Patient Account Number: [id number] 07-09-1950 (64 y.o. Treating RN: Ahmed Prima Date of Birth/Sex: Female) Other Clinician: Primary Care Physician: Ronette Deter Treating Christin Fudge Referring Physician: Ronette Deter Physician/Extender: Suella Grove in Treatment: 5 Debridement Performed for Wound #2 Left,Distal,Anterior Lower Leg Assessment: Performed By: Physician Christin Fudge, MD Debridement: Debridement Pre-procedure Yes Verification/Time Out Taken: Start Time: 08:29 Pain Control: Other : lidocaine 4% Level: Skin/Subcutaneous Tissue Total Area Debrided (L x 0.9 (cm) x 0.8 (cm) = 0.72 (cm) W): Tissue and other Viable, Non-Viable, Exudate, Fibrin/Slough, Subcutaneous material debrided: Instrument: Curette Bleeding: Minimum Hemostasis Achieved: Pressure End Time: 08:31 Procedural Pain: 0 Post Procedural Pain: 0 Response to Treatment: Procedure was tolerated well Post Debridement Measurements of Total Wound Length: (cm) 0.9 Width: (cm) 0.8 Depth: (cm) 0.2 Volume: (cm) 0.113 Post Procedure Diagnosis Same as Pre-procedure Electronic Signature(s) Signed: 05/20/2015 8:44:33 AM By: Christin Fudge MD, FACS Signed: 05/22/2015 4:19:08 PM By: Alric Quan Entered By: Christin Fudge on 05/20/2015 08:44:33 Jane Short  ([id number]) -------------------------------------------------------------------------------- HPI Details Patient Name: Jane Short 05/20/2015 8:00 Date of Service: AM Medical Record [id number] Number: Patient Account Number: [id number] 1950/09/19 (64 y.o. Treating RN: Ahmed Prima Date of Birth/Sex: Female) Other Clinician: Primary Care Physician: Ronette Deter Treating Christin Fudge Referring Physician: Ronette Deter Physician/Extender: Suella Grove in Treatment: 5 History of Present Illness Location: has had 2 open wounds on the left lower extremity with swelling of her legs Quality: Patient reports experiencing a dull pain to affected area(s). Severity: Patient states wound are getting worse. Duration: Patient has had the wound for > 2 months prior to seeking treatment at the wound center Timing: Pain in wound  is constant (hurts all the time) Context: The wound appeared gradually over time. 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 Modifying Factors: Other treatment(s) tried include:Silvadene ointment Associated Signs and Symptoms: Patient reports having increase swelling. 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 Past medical history significant for sickle cell anemia, vitamin D deficiency, hypertension, osteoporosis, atrial myxoma, gout, lichen planus and status post tubal ligation and gallbladder surgery. She is a smoker and smokes about 10 cigarettes a day. 04/22/2015 -- while removing the tape she has had another ulceration possibly due to a tape burn and this is inferior medial to the previous 2 ulcerations. She is still working on giving up smoking. She informs me that she will be  moving back to [place] early next year and hence will not see a dermatologist here and is awaiting her physicians back in [place]. 05/06/2015 -- she has quit smoking last week and is on E cigarettes now. We have discussed the merits of this. He also complains of some swelling and pain in the right lower extremity which has been going on since last week. 05/13/2015 -- pathology diagnosis-- Pathology report: DIAGNOSIS: A. SKIN, LEFT LOWER LEG; PUNCH BIOPSY: - STASIS DERMATITIS. I have discussed the above pathology report with her and she understands. Last week she saw her pulmonologist and she saw a surgeon regarding ascites and bilateral lower extremity edema. lab work, ultrasound and DVT studies have been ordered and reports are pending. Have asked her to get me these reports when she comes the next week or get her physicians to send them to me. 05/20/2015 -- last week she was seen by her pulmonologist and also worked up for lower extremity swelling and Lasix was increased to 60 mg twice a day. Follow-up was recommended with nephrology. The patient was also seen by Dr. Jamal Collin regarding a epigastric hernia and he had recommended a CT JACQUETTA, DEPP. ([id number]) scan. the CT scan showed a small fat-containing a medical hernia and a small upper abdominal wall hernia with a small amount of fluid. Of note she has a another small ulceration on a skin lesion closer to her ankle and this was caused by abrasion. She continues to be off cigarettes. Electronic Signature(s) Signed: 05/21/2015 7:56:42 AM By: Christin Fudge MD, FACS Entered By: Christin Fudge on 05/21/2015 07:56:42 Jane Short ([id number]) -------------------------------------------------------------------------------- Physical Exam Details Patient Name: ERIYAH, CHHAY 05/20/2015 8:00 Date of Service: AM Medical Record [id number] Number: Patient Account Number: [id number] 04-Feb-1951 (64 y.o. Treating RN: Ahmed Prima Date of Birth/Sex: Female) Other Clinician: Primary Care Physician: Ronette Deter Treating Christin Fudge Referring Physician: Ronette Deter Physician/Extender: Suella Grove in Treatment: 5 Constitutional . Pulse regular. Respirations normal and unlabored. Afebrile. . Eyes Nonicteric. Reactive to light. Ears, Nose, Mouth, and Throat Lips, teeth, and gums WNL.Marland Kitchen Moist mucosa without lesions. Neck supple and nontender. No palpable supraclavicular or cervical adenopathy. Normal sized without goiter. Respiratory WNL. No retractions.. Cardiovascular Pedal Pulses WNL. No clubbing, cyanosis or edema. Lymphatic No adneopathy. No adenopathy. No adenopathy. Musculoskeletal Adexa without tenderness or enlargement.. Digits and nails w/o clubbing, cyanosis, infection, petechiae, ischemia, or inflammatory conditions.. Integumentary (Hair, Skin) No suspicious lesions. No crepitus or fluctuance. No peri-wound  warmth or erythema. No masses.Marland Kitchen Psychiatric Judgement and insight Intact.. No evidence of depression, anxiety, or agitation.. Notes The edema on her left lower extremity has gone down a bit and the 2 ulcerations on the original wounds continue to have significant slough will which will need sharp debridement with a curette. She has a new ulceration on a lesion closer to ankle and this is small and has minimal slough. Electronic Signature(s) Signed: 05/21/2015 7:57:19 AM By: Christin Fudge MD, FACS Entered By: Christin Fudge on 05/21/2015 07:57:19 Jane Short ([id number]) -------------------------------------------------------------------------------- Physician Orders Details Patient Name: DIAHANN, LYND 05/20/2015 8:00 Date of Service: AM Medical Record [id number] Number: Patient Account Number: [id number] May 31, 1951 (64 y.o. Treating RN: Ahmed Prima Date of Birth/Sex: Female) Other Clinician: Primary Care Physician: Ronette Deter Treating Christin Fudge Referring  Physician: Ronette Deter Physician/Extender: Suella Grove in Treatment: 5 Verbal / Phone Orders: Yes Clinician: Pinkerton, Debi Read Back and Verified: Yes Diagnosis Coding Wound Cleansing Wound #1 Left,Proximal,Anterior Lower Leg o Clean wound with Normal Saline. Wound #2 Left,Distal,Anterior Lower Leg o Clean wound with Normal Saline. Wound #4 Left,Distal,Lateral Lower Leg o Clean wound with Normal Saline. Anesthetic Wound #1 Left,Proximal,Anterior Lower Leg o Topical Lidocaine 4% cream applied to wound bed prior to debridement Wound #2 Left,Distal,Anterior Lower Leg o Topical Lidocaine 4% cream applied to wound bed prior to debridement Primary Wound Dressing Wound #1 Left,Proximal,Anterior Lower Leg o Santyl Ointment Wound #2 Left,Distal,Anterior Lower Leg o Santyl Ointment Secondary Dressing Wound #1 Left,Proximal,Anterior Lower Leg o Boardered Foam Dressing Wound #2 Left,Distal,Anterior Lower Leg o Boardered Foam Dressing Wound #4 Left,Distal,Lateral Lower Leg o Boardered Foam Dressing HALY, BUNDA ([id number]) Dressing Change Frequency Wound #1 Left,Proximal,Anterior Lower Leg o Change dressing every day. Wound #2 Left,Distal,Anterior Lower Leg o Change dressing every day. Wound #4 Left,Distal,Lateral Lower Leg o Change dressing every other day. Follow-up Appointments Wound #1 Left,Proximal,Anterior Lower Leg o Return Appointment in 2 weeks. Wound #2 Left,Distal,Anterior Lower Leg o Return Appointment in 2 weeks. Wound #4 Left,Distal,Lateral Lower Leg o Return Appointment in 2 weeks. Edema Control Wound #1 Left,Proximal,Anterior Lower Leg o Elevate legs to the level of the heart and pump ankles as often as possible o Other: - wear your compression Wound #2 Left,Distal,Anterior Lower Leg o Elevate legs to the level of the heart and pump ankles as often as possible o Other: - wear your compression Wound #4  Left,Distal,Lateral Lower Leg o Elevate legs to the level of the heart and pump ankles as often as possible o Other: - wear your compression Patient Medications Allergies: ramipril Notifications Medication Indication Start End Santyl 05/20/2015 DOSE topical 250 unit/gram ointment - ointment topical as directed Electronic Signature(s) Signed: 05/20/2015 8:43:45 AM By: Christin Fudge MD, FACS Entered By: Christin Fudge on 05/20/2015 08:43:45 Jane Short ([id number]) -------------------------------------------------------------------------------- Problem List Details Patient Name: Jane Short 05/20/2015 8:00 Date of Service: AM Medical Record [id number] Number: Patient Account Number: [id number] 02-26-1951 (64 y.o. 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 [id number] Lichen planus, unspecified 04/15/2015 Yes D57.1 Sickle-cell disease without crisis 04/15/2015 Yes N18.9 Chronic kidney disease, unspecified 04/15/2015 Yes F17.218 Nicotine dependence, cigarettes, 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/15/2015 Yes induced disorders Inactive Problems Resolved Problems Electronic Signature(s) Signed:  05/20/2015 11:51:25 AM By: Christin Fudge MD, FACS Previous Signature: 05/20/2015 11:48:41 AM Version By: Christin Fudge MD, FACS Previous Signature: 05/20/2015 8:44:14 AM Version By: Christin Fudge MD, FACS Entered By: Christin Fudge on 05/20/2015 11:51:25 Jane Short ([id number]) -------------------------------------------------------------------------------- Progress Note Details Patient Name: CATHREN, VANDOORN 05/20/2015 8:00 Date of Service: AM Medical Record [id number] Number: Patient Account Number: [id number] March 19, 1951 (64 y.o. Treating RN: Ahmed Prima Date of Birth/Sex: Female) Other Clinician: Primary Care Physician: Ronette Deter Treating Christin Fudge Referring Physician: Ronette Deter Physician/Extender: Suella Grove in Treatment: 5 Subjective Chief Complaint Information obtained from Patient Patient presents to the wound care center for a consult due non healing wound left lower extremity with draining fluid for about 2 months History of Present Illness (HPI) The following HPI elements were documented for the patient's wound: Location: has had 2 open wounds on the left lower extremity with swelling of her legs Quality: Patient reports experiencing a dull pain to affected area(s). Severity: Patient states wound are getting worse. Duration: Patient has had the wound for > 2 months prior to seeking treatment at the wound center Timing: Pain in wound is constant (hurts all the time) Context: The wound appeared gradually over time. 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 Modifying Factors: Other treatment(s) tried include:Silvadene ointment Associated Signs and Symptoms: Patient reports having increase swelling. 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 Past medical history significant for sickle cell anemia, vitamin D deficiency, hypertension, osteoporosis, atrial myxoma, gout, lichen planus and status post tubal ligation and gallbladder surgery. She is a smoker and smokes about 10 cigarettes a day. 04/22/2015 -- while removing the tape she has had another ulceration possibly due to a tape burn and  this is inferior medial to the previous 2 ulcerations. She is still working on giving up smoking. She informs me that she will be moving back to [place] early next year and hence will not see a dermatologist here and is awaiting her physicians back in [place]. 05/06/2015 -- she has quit smoking last week and is on E cigarettes now. We have discussed the merits of this. He also complains of some swelling and pain in the right lower extremity which has been going on since last week. 05/13/2015 -- pathology diagnosis-- Pathology report: DIAGNOSIS: A. SKIN, LEFT LOWER LEG; PUNCH BIOPSY: - STASIS DERMATITIS. I have discussed the above pathology report with her and she understands. JACQUESE, YARWOOD ([id number]) Last week she saw her pulmonologist and she saw a surgeon regarding ascites and bilateral lower extremity edema. lab work, ultrasound and DVT studies have been ordered and reports are pending. Have asked her to get me these reports when she comes the next week or get her physicians to send them to me. 05/20/2015 -- last week she was seen by her pulmonologist and also worked up for lower extremity swelling and Lasix was increased to 60 mg twice a day. Follow-up was recommended with nephrology. The patient was also seen by Dr. Jamal Collin regarding a epigastric hernia and he had recommended a CT scan. the CT scan showed a small fat-containing a medical hernia and a small upper abdominal wall hernia with a small amount of fluid. Of note she  has a another small ulceration on a skin lesion closer to her ankle and this was caused by abrasion. She continues to be off cigarettes. Objective Constitutional Pulse regular. Respirations normal and unlabored. Afebrile. Vitals Time Taken: 8:07 AM, Height: 66 in, Weight: 198 lbs, BMI: 32, Temperature: 98.0 F, Pulse: 85 bpm, Respiratory Rate: 18 breaths/min, Blood Pressure: 103/52 mmHg. Eyes Nonicteric. Reactive to light. Ears, Nose, Mouth, and  Throat Lips, teeth, and gums WNL.Marland Kitchen Moist mucosa without lesions. Neck supple and nontender. No palpable supraclavicular or cervical adenopathy. Normal sized without goiter. Respiratory WNL. No retractions.. Cardiovascular Pedal Pulses WNL. No clubbing, cyanosis or edema. Lymphatic No adneopathy. No adenopathy. No adenopathy. Musculoskeletal Adexa without tenderness or enlargement.. Digits and nails w/o clubbing, cyanosis, infection, petechiae, ischemia, or inflammatory conditions.Redmond Pulling, Kristopher Oppenheim ([id number]) Psychiatric Judgement and insight Intact.. No evidence of depression, anxiety, or agitation.. General Notes: The edema on her left lower extremity has gone down a bit and the 2 ulcerations on the original wounds continue to have significant slough will which will need sharp debridement with a curette. She has a new ulceration on a lesion closer to ankle and this is small and has minimal slough. Integumentary (Hair, Skin) No suspicious lesions. No crepitus or fluctuance. No peri-wound warmth or erythema. No masses.. Wound #1 status is Open. Original cause of wound was Gradually Appeared. The wound is located on the Left,Proximal,Anterior Lower Leg. The wound measures 1.2cm length x 1.5cm width x 0.2cm depth; 1.414cm^2 area and 0.283cm^3 volume. The wound is limited to skin breakdown. There is no tunneling or undermining noted. There is a medium amount of serous drainage noted. The wound margin is thickened. There is no granulation within the wound bed. There is a large (67-100%) amount of necrotic tissue within the wound bed including Adherent Slough. The periwound skin appearance exhibited: Scarring, Moist. Periwound temperature was noted as No Abnormality. Wound #2 status is Open. Original cause of wound was Gradually Appeared. The wound is located on the [facility] Lower Leg. The wound measures 0.9cm length x 0.8cm width x 0.1cm depth; 0.565cm^2 area and 0.057cm^3  volume. The wound is limited to skin breakdown. There is no tunneling or undermining noted. There is a medium amount of serous drainage noted. The wound margin is thickened. There is no granulation within the wound bed. There is a large (67-100%) amount of necrotic tissue within the wound bed including Adherent Slough. The periwound skin appearance exhibited: Scarring, Moist. Periwound temperature was noted as No Abnormality. Wound #4 status is Open. Original cause of wound was Trauma. The wound is located on the Left,Distal,Lateral Lower Leg. The wound measures 0.3cm length x 0.3cm width x 0.2cm depth; 0.071cm^2 area and 0.014cm^3 volume. The wound is limited to skin breakdown. There is a medium amount of serous drainage noted. The wound margin is flat and intact. There is no granulation within the wound bed. There is a large (67-100%) amount of necrotic tissue within the wound bed including Adherent Slough. The periwound skin appearance exhibited: Moist. Periwound temperature was noted as No Abnormality. The periwound has tenderness on palpation. Assessment Active Problems ICD-10 I89.0 - Lymphedema, not elsewhere classified L97.222 - Non-pressure chronic ulcer of left calf with fat layer exposed [id number] - Lichen planus, unspecified D57.1 - Sickle-cell disease without crisis N18.9 - Chronic kidney disease, unspecified F17.218 - Nicotine dependence, cigarettes, with other nicotine-induced disorders ZAPHIRA, WEIMAN ([id number]) She has been started on Santyl to both her wounds and I have recommended she wears her compression  stockings to the left lower extremity. I have recommended Santyl to be applied daily with a bordered foam dressing and for her to use compression stockings of the 20-30 mm variety.he has not been doing this regularly and I have discussed the need to be very compliant starting first thing in the morning and taking them out lasting at night. We have also discussed  elevation in bed with 2 pillows and appropriate exercises. She says she is going to be compliant in doing this on her reports the next week. She is also urged to see a dermatologist as soon as possible as she is not going back to [place] in the near future. Procedures Wound #1 Wound #1 is a Lymphedema located on the Left,Proximal,Anterior Lower Leg . There was a Skin/Subcutaneous Tissue Debridement [id number]) debridement with total area of 1.8 sq cm performed by Christin Fudge, MD. with the following instrument(s): Curette to remove Viable and Non-Viable tissue/material including Exudate, Fibrin/Slough, and Subcutaneous after achieving pain control using Other (lidocaine 4%). A time out was conducted prior to the start of the procedure. A Minimum amount of bleeding was controlled with Pressure. The procedure was tolerated well with a pain level of 0 throughout and a pain level of 0 following the procedure. Post Debridement Measurements: 1.2cm length x 1.5cm width x 0.3cm depth; 0.424cm^3 volume. Post procedure Diagnosis Wound #1: Same as Pre-Procedure Wound #2 Wound #2 is a Lymphedema located on the Left,Distal,Anterior Lower Leg . There was a Skin/Subcutaneous Tissue Debridement [id number]) debridement with total area of 0.72 sq cm performed by Christin Fudge, MD. with the following instrument(s): Curette to remove Viable and Non-Viable tissue/material including Exudate, Fibrin/Slough, and Subcutaneous after achieving pain control using Other (lidocaine 4%). A time out was conducted prior to the start of the procedure. A Minimum amount of bleeding was controlled with Pressure. The procedure was tolerated well with a pain level of 0 throughout and a pain level of 0 following the procedure. Post Debridement Measurements: 0.9cm length x 0.8cm width x 0.2cm depth; 0.113cm^3 volume. Post procedure Diagnosis Wound #2: Same as Pre-Procedure DEVANNA, CRUMBLISS ([id number]) Plan Wound  Cleansing: Wound #1 Left,Proximal,Anterior Lower Leg: Clean wound with Normal Saline. Wound #2 Left,Distal,Anterior Lower Leg: Clean wound with Normal Saline. Wound #4 Left,Distal,Lateral Lower Leg: Clean wound with Normal Saline. Anesthetic: Wound #1 Left,Proximal,Anterior Lower Leg: Topical Lidocaine 4% cream applied to wound bed prior to debridement Wound #2 Left,Distal,Anterior Lower Leg: Topical Lidocaine 4% cream applied to wound bed prior to debridement Primary Wound Dressing: Wound #1 Left,Proximal,Anterior Lower Leg: Santyl Ointment Wound #2 Left,Distal,Anterior Lower Leg: Santyl Ointment Secondary Dressing: Wound #1 Left,Proximal,Anterior Lower Leg: Boardered Foam Dressing Wound #2 Left,Distal,Anterior Lower Leg: Boardered Foam Dressing Wound #4 Left,Distal,Lateral Lower Leg: Boardered Foam Dressing Dressing Change Frequency: Wound #1 Left,Proximal,Anterior Lower Leg: Change dressing every day. Wound #2 Left,Distal,Anterior Lower Leg: Change dressing every day. Wound #4 Left,Distal,Lateral Lower Leg: Change dressing every other day. Follow-up Appointments: Wound #1 Left,Proximal,Anterior Lower Leg: Return Appointment in 2 weeks. Wound #2 Left,Distal,Anterior Lower Leg: Return Appointment in 2 weeks. Wound #4 Left,Distal,Lateral Lower Leg: Return Appointment in 2 weeks. Edema Control: Wound #1 Left,Proximal,Anterior Lower Leg: Elevate legs to the level of the heart and pump ankles as often as possible Other: - wear your compression Wound #2 Left,Distal,Anterior Lower Leg: Elevate legs to the level of the heart and pump ankles as often as possible Other: - wear your compression Wound #4 Left,Distal,Lateral Lower Leg: Elevate legs to the level of the heart  and pump ankles as often as possible ROBERTTA, FINNELL ([id number]) Other: - wear your compression The following medication(s) was prescribed: Santyl topical 250 unit/gram ointment ointment topical  as directed starting 05/20/2015 She has been started on Santyl to both her wounds and I have recommended she wears her compression stockings to the left lower extremity. I have recommended Santyl to be applied daily with a bordered foam dressing and for her to use compression stockings of the 20-30 mm variety.he has not been doing this regularly and I have discussed the need to be very compliant starting first thing in the morning and taking them out lasting at night. We have also discussed elevation in bed with 2 pillows and appropriate exercises. She says she is going to be compliant in doing this on her reports the next week. She is also urged to see a dermatologist as soon as possible as she is not going back to [place] in the near future. Electronic Signature(s) Signed: 05/21/2015 7:59:56 AM By: Christin Fudge MD, FACS Entered By: Christin Fudge on 05/21/2015 07:59:56 Jane Short ([id number]) -------------------------------------------------------------------------------- SuperBill Details Patient Name: Jane Short Date of Service: 05/20/2015 Medical Record Number: [id number] Patient Account Number: [id number] Date of Birth/Sex: [birth year] (64 y.o. 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, cigarettes, with other nicotine-induced disorders Facility Procedures CPT4 Code: [id number] Description: [id number] - DEB SUBQ TISSUE 20 SQ CM/< ICD-10 Description Diagnosis I89.0 Lymphedema, not elsewhere classified L97.222  Non-pressure chronic ulcer of left calf with fat l [id number] Lichen planus, unspecified N18.9 Chronic kidney disease, unspecified Modifier: ayer exposed Quantity: 1 Physician Procedures CPT4 [id number] Description: [id number] - WC PHYS LEVEL 3 - EST PT ICD-10 Description Diagnosis I89.0 Lymphedema, not elsewhere classified 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 with fat l [id number] Lichen planus, unspecified N18.9 Chronic kidney disease, unspecified Modifier: 25 ayer exposed Quantity: 1 CPT4 Code: [id number] Linden Dolin Description: 11042 - WC PHYS SUBQ TISS 20 SQ CM ICD-10 Description Diagnosis I89.0 Lymphedema, not elsewhere classified 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 with fat l E C. ([id number]) Modifier: ayer exposed Quantity: 1 Electronic Signature(s) Signed: 05/21/2015 8:00:27 AM By: Christin Fudge MD, FACS Entered By: Christin Fudge on 05/21/2015 08:00:26

## 2015-05-29 ENCOUNTER — Inpatient Hospital Stay: Payer: BLUE CROSS/BLUE SHIELD

## 2015-05-29 ENCOUNTER — Other Ambulatory Visit: Payer: Self-pay

## 2015-05-29 ENCOUNTER — Inpatient Hospital Stay: Payer: BLUE CROSS/BLUE SHIELD | Admitting: *Deleted

## 2015-05-29 VITALS — BP 107/65 | HR 112 | Temp 98.5°F

## 2015-05-29 DIAGNOSIS — N189 Chronic kidney disease, unspecified: Principal | ICD-10-CM

## 2015-05-29 DIAGNOSIS — D571 Sickle-cell disease without crisis: Secondary | ICD-10-CM

## 2015-05-29 DIAGNOSIS — D631 Anemia in chronic kidney disease: Secondary | ICD-10-CM

## 2015-05-29 LAB — SAMPLE TO BLOOD BANK

## 2015-05-29 LAB — HEMOGLOBIN: HEMOGLOBIN: 6.3 g/dL — AB (ref 12.0–16.0)

## 2015-05-29 MED ORDER — EPOETIN ALFA 40000 UNIT/ML IJ SOLN
40000.0000 [IU] | Freq: Once | INTRAMUSCULAR | Status: AC
  Administered 2015-05-29: 40000 [IU] via SUBCUTANEOUS
  Filled 2015-05-29: qty 1

## 2015-05-30 ENCOUNTER — Encounter: Payer: Self-pay | Admitting: General Surgery

## 2015-05-30 ENCOUNTER — Telehealth: Payer: Self-pay | Admitting: *Deleted

## 2015-05-30 ENCOUNTER — Ambulatory Visit (INDEPENDENT_AMBULATORY_CARE_PROVIDER_SITE_OTHER): Payer: BLUE CROSS/BLUE SHIELD | Admitting: General Surgery

## 2015-05-30 VITALS — BP 112/58 | HR 88 | Resp 16 | Ht 67.0 in | Wt 206.0 lb

## 2015-05-30 DIAGNOSIS — K5790 Diverticulosis of intestine, part unspecified, without perforation or abscess without bleeding: Secondary | ICD-10-CM

## 2015-05-30 DIAGNOSIS — K439 Ventral hernia without obstruction or gangrene: Secondary | ICD-10-CM | POA: Diagnosis not present

## 2015-05-30 DIAGNOSIS — R1030 Lower abdominal pain, unspecified: Secondary | ICD-10-CM | POA: Diagnosis not present

## 2015-05-30 NOTE — Telephone Encounter (Signed)
RN reviewed patient's labs from yesterday. Called pt. Reviewed labs with patient. hgb 6.3. She received procrit 40000 Units yesterday in the clinic. The patient states that at this time she is aymptomatic. She states that feels well. She denies any dyspnea or extreme fatigue. She will have her labs rechecked next week and will let us know next week if she desires a blood if counts drop. She "declines a blood transfusion unless she drops below 6 or is symptomatic" per patient.

## 2015-05-30 NOTE — Progress Notes (Signed)
This is a 64 year old female here today to discuss her ct scan that was done on 05/17/15.  I have reviewed the history of present illness with the patient. CT scan reviewed. Fatty epigastric hernia and a very small fat containing umbilical hernia. No hernias in lower abdomen . No significant lower abdominal findings. Pt advised on this. Her pain/discomfort appears to be in lower mid abdomen-this is not explained by the epigastric hernia.  Abdomen is soft. There is edema of abdominal wall-pt aware of this.  Epigastric  hernia is reducible and non tender.  Pt advised fully. Do not recommend hernia repair at present. Patient to return in three months or sooner if she has worsening pain in lower abdomen or if the hernia in upper abdomen causes symptoms.   This information has been scribed by Gaspar Cola CMA. PCP:  Ronette Deter

## 2015-05-30 NOTE — Patient Instructions (Signed)
Patient to return in three months.  

## 2015-06-02 DIAGNOSIS — I509 Heart failure, unspecified: Secondary | ICD-10-CM

## 2015-06-02 HISTORY — DX: Heart failure, unspecified: I50.9

## 2015-06-05 ENCOUNTER — Encounter: Payer: Self-pay | Admitting: Internal Medicine

## 2015-06-05 ENCOUNTER — Inpatient Hospital Stay
Admit: 2015-06-05 | Discharge: 2015-06-05 | Disposition: A | Discharge status: Home / Self Care | Payer: BLUE CROSS/BLUE SHIELD | Attending: Internal Medicine | Admitting: Internal Medicine

## 2015-06-05 ENCOUNTER — Emergency Department: Payer: BLUE CROSS/BLUE SHIELD

## 2015-06-05 ENCOUNTER — Inpatient Hospital Stay: Payer: BLUE CROSS/BLUE SHIELD

## 2015-06-05 ENCOUNTER — Telehealth: Payer: Self-pay | Admitting: *Deleted

## 2015-06-05 ENCOUNTER — Inpatient Hospital Stay
Admission: EM | Admit: 2015-06-05 | Discharge: 2015-06-24 | DRG: 811 | Payer: BLUE CROSS/BLUE SHIELD | Attending: Internal Medicine | Admitting: Internal Medicine

## 2015-06-05 ENCOUNTER — Inpatient Hospital Stay: Payer: BLUE CROSS/BLUE SHIELD | Attending: Internal Medicine

## 2015-06-05 DIAGNOSIS — I4891 Unspecified atrial fibrillation: Secondary | ICD-10-CM | POA: Diagnosis present

## 2015-06-05 DIAGNOSIS — Z7401 Bed confinement status: Secondary | ICD-10-CM

## 2015-06-05 DIAGNOSIS — R509 Fever, unspecified: Secondary | ICD-10-CM

## 2015-06-05 DIAGNOSIS — Z87891 Personal history of nicotine dependence: Secondary | ICD-10-CM

## 2015-06-05 DIAGNOSIS — K7589 Other specified inflammatory liver diseases: Secondary | ICD-10-CM | POA: Diagnosis present

## 2015-06-05 DIAGNOSIS — M2550 Pain in unspecified joint: Secondary | ICD-10-CM

## 2015-06-05 DIAGNOSIS — I5031 Acute diastolic (congestive) heart failure: Secondary | ICD-10-CM | POA: Diagnosis present

## 2015-06-05 DIAGNOSIS — I130 Hypertensive heart and chronic kidney disease with heart failure and stage 1 through stage 4 chronic kidney disease, or unspecified chronic kidney disease: Secondary | ICD-10-CM | POA: Diagnosis present

## 2015-06-05 DIAGNOSIS — M818 Other osteoporosis without current pathological fracture: Secondary | ICD-10-CM

## 2015-06-05 DIAGNOSIS — R17 Unspecified jaundice: Secondary | ICD-10-CM | POA: Insufficient documentation

## 2015-06-05 DIAGNOSIS — I878 Other specified disorders of veins: Secondary | ICD-10-CM

## 2015-06-05 DIAGNOSIS — I509 Heart failure, unspecified: Secondary | ICD-10-CM

## 2015-06-05 DIAGNOSIS — Z8249 Family history of ischemic heart disease and other diseases of the circulatory system: Secondary | ICD-10-CM | POA: Diagnosis not present

## 2015-06-05 DIAGNOSIS — D638 Anemia in other chronic diseases classified elsewhere: Secondary | ICD-10-CM | POA: Diagnosis not present

## 2015-06-05 DIAGNOSIS — E559 Vitamin D deficiency, unspecified: Secondary | ICD-10-CM

## 2015-06-05 DIAGNOSIS — R0902 Hypoxemia: Secondary | ICD-10-CM

## 2015-06-05 DIAGNOSIS — N179 Acute kidney failure, unspecified: Secondary | ICD-10-CM | POA: Diagnosis present

## 2015-06-05 DIAGNOSIS — E785 Hyperlipidemia, unspecified: Secondary | ICD-10-CM | POA: Diagnosis present

## 2015-06-05 DIAGNOSIS — D57219 Sickle-cell/Hb-C disease with crisis, unspecified: Secondary | ICD-10-CM | POA: Diagnosis present

## 2015-06-05 DIAGNOSIS — L439 Lichen planus, unspecified: Secondary | ICD-10-CM

## 2015-06-05 DIAGNOSIS — Z833 Family history of diabetes mellitus: Secondary | ICD-10-CM | POA: Diagnosis not present

## 2015-06-05 DIAGNOSIS — J449 Chronic obstructive pulmonary disease, unspecified: Secondary | ICD-10-CM | POA: Diagnosis present

## 2015-06-05 DIAGNOSIS — N170 Acute kidney failure with tubular necrosis: Secondary | ICD-10-CM | POA: Diagnosis present

## 2015-06-05 DIAGNOSIS — D631 Anemia in chronic kidney disease: Secondary | ICD-10-CM | POA: Diagnosis present

## 2015-06-05 DIAGNOSIS — A419 Sepsis, unspecified organism: Secondary | ICD-10-CM | POA: Diagnosis present

## 2015-06-05 DIAGNOSIS — D151 Benign neoplasm of heart: Secondary | ICD-10-CM | POA: Diagnosis present

## 2015-06-05 DIAGNOSIS — J9601 Acute respiratory failure with hypoxia: Secondary | ICD-10-CM | POA: Diagnosis present

## 2015-06-05 DIAGNOSIS — M810 Age-related osteoporosis without current pathological fracture: Secondary | ICD-10-CM | POA: Diagnosis present

## 2015-06-05 DIAGNOSIS — R531 Weakness: Secondary | ICD-10-CM

## 2015-06-05 DIAGNOSIS — R7989 Other specified abnormal findings of blood chemistry: Secondary | ICD-10-CM

## 2015-06-05 DIAGNOSIS — N2581 Secondary hyperparathyroidism of renal origin: Secondary | ICD-10-CM | POA: Diagnosis present

## 2015-06-05 DIAGNOSIS — N186 End stage renal disease: Secondary | ICD-10-CM

## 2015-06-05 DIAGNOSIS — Z95828 Presence of other vascular implants and grafts: Secondary | ICD-10-CM

## 2015-06-05 DIAGNOSIS — I8290 Acute embolism and thrombosis of unspecified vein: Secondary | ICD-10-CM

## 2015-06-05 DIAGNOSIS — K7460 Unspecified cirrhosis of liver: Secondary | ICD-10-CM | POA: Diagnosis present

## 2015-06-05 DIAGNOSIS — T39395A Adverse effect of other nonsteroidal anti-inflammatory drugs [NSAID], initial encounter: Secondary | ICD-10-CM | POA: Diagnosis present

## 2015-06-05 DIAGNOSIS — R5383 Other fatigue: Secondary | ICD-10-CM

## 2015-06-05 DIAGNOSIS — I081 Rheumatic disorders of both mitral and tricuspid valves: Secondary | ICD-10-CM | POA: Diagnosis present

## 2015-06-05 DIAGNOSIS — Z789 Other specified health status: Secondary | ICD-10-CM

## 2015-06-05 DIAGNOSIS — Z803 Family history of malignant neoplasm of breast: Secondary | ICD-10-CM | POA: Diagnosis not present

## 2015-06-05 DIAGNOSIS — I340 Nonrheumatic mitral (valve) insufficiency: Secondary | ICD-10-CM | POA: Diagnosis present

## 2015-06-05 DIAGNOSIS — K767 Hepatorenal syndrome: Secondary | ICD-10-CM | POA: Diagnosis present

## 2015-06-05 DIAGNOSIS — N184 Chronic kidney disease, stage 4 (severe): Secondary | ICD-10-CM | POA: Diagnosis present

## 2015-06-05 DIAGNOSIS — M109 Gout, unspecified: Secondary | ICD-10-CM | POA: Diagnosis present

## 2015-06-05 DIAGNOSIS — M7989 Other specified soft tissue disorders: Secondary | ICD-10-CM

## 2015-06-05 DIAGNOSIS — R0602 Shortness of breath: Secondary | ICD-10-CM

## 2015-06-05 DIAGNOSIS — R945 Abnormal results of liver function studies: Secondary | ICD-10-CM

## 2015-06-05 DIAGNOSIS — I071 Rheumatic tricuspid insufficiency: Secondary | ICD-10-CM | POA: Diagnosis present

## 2015-06-05 DIAGNOSIS — K7290 Hepatic failure, unspecified without coma: Secondary | ICD-10-CM | POA: Diagnosis present

## 2015-06-05 DIAGNOSIS — I10 Essential (primary) hypertension: Secondary | ICD-10-CM

## 2015-06-05 DIAGNOSIS — R112 Nausea with vomiting, unspecified: Secondary | ICD-10-CM

## 2015-06-05 DIAGNOSIS — R188 Other ascites: Secondary | ICD-10-CM

## 2015-06-05 DIAGNOSIS — J810 Acute pulmonary edema: Secondary | ICD-10-CM | POA: Diagnosis present

## 2015-06-05 DIAGNOSIS — R109 Unspecified abdominal pain: Secondary | ICD-10-CM

## 2015-06-05 LAB — COMPREHENSIVE METABOLIC PANEL
ALBUMIN: 3.2 g/dL — AB (ref 3.5–5.0)
ALT: 17 U/L (ref 14–54)
AST: 35 U/L (ref 15–41)
Alkaline Phosphatase: 105 U/L (ref 38–126)
Anion gap: 10 (ref 5–15)
BUN: 51 mg/dL — AB (ref 6–20)
CHLORIDE: 100 mmol/L — AB (ref 101–111)
CO2: 25 mmol/L (ref 22–32)
Calcium: 10.2 mg/dL (ref 8.9–10.3)
Creatinine, Ser: 2.59 mg/dL — ABNORMAL HIGH (ref 0.44–1.00)
GFR calc Af Amer: 21 mL/min — ABNORMAL LOW (ref >60)
GFR, EST NON AFRICAN AMERICAN: 18 mL/min — AB (ref >60)
GLUCOSE: 116 mg/dL — AB (ref 65–99)
POTASSIUM: 4.4 mmol/L (ref 3.5–5.1)
SODIUM: 135 mmol/L (ref 135–145)
Total Bilirubin: 17.2 mg/dL — ABNORMAL HIGH (ref 0.3–1.2)
Total Protein: 6.9 g/dL (ref 6.5–8.1)

## 2015-06-05 LAB — CBC
HEMATOCRIT: 18.1 % — AB (ref 35.0–47.0)
HEMOGLOBIN: 6.1 g/dL — AB (ref 12.0–16.0)
MCH: 34.8 pg — ABNORMAL HIGH (ref 26.0–34.0)
MCHC: 33.7 g/dL (ref 32.0–36.0)
MCV: 103.5 fL — ABNORMAL HIGH (ref 80.0–100.0)
PLATELETS: 269 10*3/uL (ref 150–440)
RBC: 1.75 MIL/uL — AB (ref 3.80–5.20)
RDW: 27.6 % — AB (ref 11.5–14.5)
WBC: 11.1 10*3/uL — ABNORMAL HIGH (ref 3.6–11.0)

## 2015-06-05 LAB — URINALYSIS COMPLETE WITH MICROSCOPIC (ARMC ONLY)
Bilirubin Urine: NEGATIVE
GLUCOSE, UA: NEGATIVE mg/dL
KETONES UR: NEGATIVE mg/dL
LEUKOCYTES UA: NEGATIVE
NITRITE: NEGATIVE
PROTEIN: 100 mg/dL — AB
SPECIFIC GRAVITY, URINE: 1.01 (ref 1.005–1.030)
pH: 5 (ref 5.0–8.0)

## 2015-06-05 LAB — TROPONIN I: TROPONIN I: 0.03 ng/mL (ref <0.031)

## 2015-06-05 LAB — BRAIN NATRIURETIC PEPTIDE: B Natriuretic Peptide: 683 pg/mL — ABNORMAL HIGH (ref 0.0–100.0)

## 2015-06-05 LAB — BILIRUBIN, DIRECT: BILIRUBIN DIRECT: 8.1 mg/dL — AB (ref 0.1–0.5)

## 2015-06-05 LAB — PREPARE RBC (CROSSMATCH)

## 2015-06-05 LAB — LACTIC ACID, PLASMA: Lactic Acid, Venous: 1.2 mmol/L (ref 0.5–2.0)

## 2015-06-05 MED ORDER — CYCLOBENZAPRINE HCL 10 MG PO TABS
5.0000 mg | ORAL_TABLET | Freq: Every day | ORAL | Status: DC
  Administered 2015-06-05 – 2015-06-20 (×16): 5 mg via ORAL
  Filled 2015-06-05 (×16): qty 1

## 2015-06-05 MED ORDER — ACETAMINOPHEN 650 MG RE SUPP: 650.0000 mg | Freq: Four times a day (QID) | RECTAL | Status: DC | PRN

## 2015-06-05 MED ORDER — DILTIAZEM HCL 25 MG/5ML IV SOLN
5.0000 mg | Freq: Once | INTRAVENOUS | Status: AC
  Administered 2015-06-05: 5 mg via INTRAVENOUS
  Filled 2015-06-05: qty 5

## 2015-06-05 MED ORDER — DILTIAZEM HCL 25 MG/5ML IV SOLN: 5.0000 mg | Freq: Once | INTRAVENOUS | Status: AC

## 2015-06-05 MED ORDER — FOLIC ACID 1 MG PO TABS
1.0000 mg | ORAL_TABLET | Freq: Every day | ORAL | Status: DC
  Administered 2015-06-05 – 2015-06-24 (×20): 1 mg via ORAL
  Filled 2015-06-05 (×20): qty 1

## 2015-06-05 MED ORDER — SODIUM CHLORIDE 0.9 % IJ SOLN
3.0000 mL | Freq: Two times a day (BID) | INTRAMUSCULAR | Status: DC
Start: 2015-06-05 — End: 2015-06-16
  Administered 2015-06-05 – 2015-06-15 (×13): 3 mL via INTRAVENOUS

## 2015-06-05 MED ORDER — FUROSEMIDE 10 MG/ML IJ SOLN
40.0000 mg | Freq: Two times a day (BID) | INTRAMUSCULAR | Status: DC
  Administered 2015-06-05 – 2015-06-06 (×3): 40 mg via INTRAVENOUS
  Filled 2015-06-05 (×3): qty 4

## 2015-06-05 MED ORDER — METOPROLOL TARTRATE 25 MG PO TABS
25.0000 mg | ORAL_TABLET | Freq: Three times a day (TID) | ORAL | Status: DC
  Administered 2015-06-05 – 2015-06-16 (×26): 25 mg via ORAL
  Filled 2015-06-05 (×28): qty 1

## 2015-06-05 MED ORDER — CALCITRIOL 0.25 MCG PO CAPS
0.2500 ug | ORAL_CAPSULE | Freq: Every day | ORAL | Status: DC
  Administered 2015-06-05 – 2015-06-24 (×20): 0.25 ug via ORAL
  Filled 2015-06-05 (×20): qty 1

## 2015-06-05 MED ORDER — ONDANSETRON HCL 4 MG/2ML IJ SOLN
4.0000 mg | Freq: Four times a day (QID) | INTRAMUSCULAR | Status: DC | PRN
  Administered 2015-06-20 (×2): 4 mg via INTRAVENOUS
  Filled 2015-06-05 (×2): qty 2

## 2015-06-05 MED ORDER — SODIUM CHLORIDE 0.9 % IV SOLN
Freq: Once | INTRAVENOUS | Status: DC
  Administered 2015-06-05: 11:00:00 via INTRAVENOUS

## 2015-06-05 MED ORDER — ACETAMINOPHEN 325 MG PO TABS
650.0000 mg | ORAL_TABLET | Freq: Four times a day (QID) | ORAL | Status: DC | PRN
  Administered 2015-06-05: 650 mg via ORAL
  Filled 2015-06-05: qty 2

## 2015-06-05 MED ORDER — MOMETASONE FURO-FORMOTEROL FUM 200-5 MCG/ACT IN AERO
2.0000 | INHALATION_SPRAY | Freq: Two times a day (BID) | RESPIRATORY_TRACT | Status: DC
  Administered 2015-06-05 – 2015-06-19 (×26): 2 via RESPIRATORY_TRACT
  Filled 2015-06-05: qty 8.8

## 2015-06-05 MED ORDER — COLLAGENASE 250 UNIT/GM EX OINT
1.0000 "application " | TOPICAL_OINTMENT | Freq: Every day | CUTANEOUS | Status: DC
  Administered 2015-06-05 – 2015-06-24 (×20): 1 via TOPICAL
  Filled 2015-06-05 (×2): qty 30

## 2015-06-05 MED ORDER — ALPRAZOLAM 0.5 MG PO TABS
0.5000 mg | ORAL_TABLET | Freq: Once | ORAL | Status: AC
  Administered 2015-06-05: 0.5 mg via ORAL
  Filled 2015-06-05: qty 1

## 2015-06-05 MED ORDER — IPRATROPIUM-ALBUTEROL 0.5-2.5 (3) MG/3ML IN SOLN
3.0000 mL | RESPIRATORY_TRACT | Status: DC
  Administered 2015-06-05 – 2015-06-08 (×17): 3 mL via RESPIRATORY_TRACT
  Filled 2015-06-05 (×18): qty 3

## 2015-06-05 MED ORDER — HYDROCODONE-ACETAMINOPHEN 5-325 MG PO TABS
1.0000 | ORAL_TABLET | Freq: Four times a day (QID) | ORAL | Status: DC | PRN
  Administered 2015-06-05 – 2015-06-23 (×16): 1 via ORAL
  Filled 2015-06-05 (×17): qty 1

## 2015-06-05 MED ORDER — ONDANSETRON HCL 4 MG PO TABS
4.0000 mg | ORAL_TABLET | Freq: Four times a day (QID) | ORAL | Status: DC | PRN
Start: 2015-06-05 — End: 2015-06-25

## 2015-06-05 MED ORDER — FUROSEMIDE 10 MG/ML IJ SOLN
40.0000 mg | Freq: Once | INTRAMUSCULAR | Status: AC
  Administered 2015-06-05: 40 mg via INTRAVENOUS
  Filled 2015-06-05: qty 4

## 2015-06-05 MED ORDER — ROSUVASTATIN CALCIUM 10 MG PO TABS
5.0000 mg | ORAL_TABLET | Freq: Every day | ORAL | Status: DC
  Administered 2015-06-05 – 2015-06-14 (×10): 5 mg via ORAL
  Filled 2015-06-05 (×10): qty 1

## 2015-06-05 MED ORDER — UMECLIDINIUM BROMIDE 62.5 MCG/INH IN AEPB
1.0000 | INHALATION_SPRAY | Freq: Every day | RESPIRATORY_TRACT | Status: DC
  Administered 2015-06-05: 1 via RESPIRATORY_TRACT
  Filled 2015-06-05: qty 1

## 2015-06-05 MED ORDER — CETYLPYRIDINIUM CHLORIDE 0.05 % MT LIQD
7.0000 mL | Freq: Two times a day (BID) | OROMUCOSAL | Status: DC
  Administered 2015-06-05 – 2015-06-16 (×10): 7 mL via OROMUCOSAL

## 2015-06-05 MED ORDER — ONDANSETRON HCL 4 MG/2ML IJ SOLN
4.0000 mg | Freq: Once | INTRAMUSCULAR | Status: AC
  Administered 2015-06-05: 4 mg via INTRAVENOUS
  Filled 2015-06-05: qty 2

## 2015-06-05 MED ORDER — HYDROXYUREA 500 MG PO CAPS
500.0000 mg | ORAL_CAPSULE | Freq: Every day | ORAL | Status: DC
  Administered 2015-06-06 – 2015-06-14 (×9): 500 mg via ORAL
  Filled 2015-06-05 (×11): qty 1

## 2015-06-05 MED ORDER — ALLOPURINOL 100 MG PO TABS
100.0000 mg | ORAL_TABLET | Freq: Every day | ORAL | Status: DC
  Administered 2015-06-05 – 2015-06-24 (×20): 100 mg via ORAL
  Filled 2015-06-05 (×20): qty 1

## 2015-06-05 NOTE — ED Provider Notes (Signed)
[facility] Emergency Department Provider Note  ____________________________________________  Time seen: Approximately 350 AM  I have reviewed the triage vital signs and the nursing notes.   HISTORY  Chief Complaint Emesis    HPI Charnel Wolanin is a 65 y.o. female who comes into the hospital today with dry heaving. The patient reports that it started on Sunday. The patient has had vomiting she reports from months previously but it went away on its own. The patient reports that she had been talking with her doctors about these symptoms and had a CT scan of her abdomen which just showed hernias. The patient reports that she also gets her blood checked regularly at her doctor's office. She reports that she's been told she has a terrible case of edema in her stomach in her legs and she is being seen by Dr. Jamal Collin, Dr. Clayborn Bigness and her hematologist and nephrologist. The patient reports her abdomen is swollen more than normal but it is been like this for months. She was told that she had fluid in her belly especially due to her heart failure and kidney failure. She reports that her kidney doctor recently increased her fluid pill but does not seem to be helping anymore. She reports that when she vomited is mainly when she ate. The patient denies any diarrhea reports that she did not have a bowel movement today. The patient came in with EMS and was hypoxic on arrival.   Past Medical History  Diagnosis Date  . Sickle cell anemia (HCC)   . Vitamin D deficiency   . Hypertension   . Osteoporosis 2011    osteopenia  . 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 Cheree Ditto at [facility]  . Gout   . Sickle cell anemia (HCC)   . Lichen planus   . 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 Lichen planus [id number]  . COPD (chronic obstructive pulmonary disease) (HCC)     symptoms Dr. Patricia Pesa     Patient Active Problem List   Diagnosis Date Noted  . Hernia, ventral  04/17/2015  . Lichen planus [id number]  . SOB (shortness of breath) 01/29/2015  . Chronic kidney disease (CKD), stage III (moderate) 01/17/2015  . Cough 01/17/2015  . Atherosclerosis of aorta ([place]) 01/10/2015  . Abnormal CXR 01/03/2015  . Bilateral low back pain without sciatica 01/03/2015  . Anemia of chronic renal failure 10/17/2014  . Hb-SS disease without crisis ([place]) 10/17/2014  . Iron overload 10/17/2014  . Left hip pain 12/12/2013  . Screening for colon cancer 08/04/2012  . Screening for breast cancer 08/04/2012  . Edema 11/19/2011  . Atrial myxoma 07/23/2011    Past Surgical History  Procedure Laterality Date  . Tubal ligation    . Gallbladder surgery    . Colonoscopy  2014    [facility]    Current Outpatient Rx  Name  Route  Sig  Dispense  Refill  . albuterol (PROVENTIL HFA;VENTOLIN HFA) 108 (90 BASE) MCG/ACT inhaler   Inhalation   Inhale 2 puffs into the lungs every 6 (six) hours as needed.   18 g   6   . allopurinol (ZYLOPRIM) 100 MG tablet   Oral   Take 100 mg by mouth daily.          . calcitRIOL (ROCALTROL) 0.25 MCG capsule   Oral   Take 0.25 mcg by mouth daily.         . cyclobenzaprine (FLEXERIL) 5 MG tablet  Oral   Take 1 tablet (5 mg total) by mouth at bedtime.   30 tablet   1   . folic acid (FOLVITE) 1 MG tablet   Oral   Take 1 mg by mouth daily.         . furosemide (LASIX) 20 MG tablet   Oral   Take 60 mg by mouth 2 (two) times daily.          . hydroxyurea (HYDREA) 500 MG capsule   Oral   Take 500 mg by mouth daily. May take with food to minimize GI side effects.         Marland Kitchen losartan (COZAAR) 50 MG tablet   Oral   Take 50 mg by mouth daily.         . mometasone-formoterol (DULERA) 200-5 MCG/ACT AERO   Inhalation   Inhale 2 puffs into the lungs 2 (two) times daily.   1 Inhaler   12   . rosuvastatin (CRESTOR) 5 MG tablet   Oral   Take 1 tablet (5 mg total) by mouth daily.   90 tablet   3   . SANTYL ointment    Topical   Apply 1 application topically daily as needed.           Dispense as written.   Marland Kitchen Umeclidinium Bromide (INCRUSE ELLIPTA) 62.5 MCG/INH AEPB   Inhalation   Inhale 1 puff into the lungs daily.   30 each   5   . silver sulfADIAZINE (SILVADENE) 1 % cream   Topical   Apply 1 application topically 2 (two) times daily. Patient not taking: Reported on 06/05/2015   50 g   0     Allergies Ramipril  Family History  Problem Relation Age of Onset  . Heart disease Father   . Diabetes Father   . Diabetes Sister   . Cancer Mother     breast cancer  . Cancer Maternal Aunt     Breast cancer    Social History Social History  Substance Use Topics  . Smoking status: Former Smoker -- 0.25 packs/day for 30 years    Types: Cigarettes    Quit date: 04/29/2015  . Smokeless tobacco: Never Used  . Alcohol Use: No    Review of Systems Constitutional: No fever/chills Eyes: No visual changes. ENT: No sore throat. Cardiovascular: Denies chest pain. Respiratory:  shortness of breath. Gastrointestinal:  abdominal pain nausea and vomiting Genitourinary: Negative for dysuria. Musculoskeletal: Negative for back pain. Skin: Negative for rash. Neurological: Negative for headaches, focal weakness or numbness.  10-point ROS otherwise negative.  ____________________________________________   PHYSICAL EXAM:  VITAL SIGNS: ED Triage Vitals  Enc Vitals Group     BP 06/05/15 0329 128/54 mmHg     Pulse Rate 06/05/15 0329 105     Resp 06/05/15 0329 22     Temp 06/05/15 0329 98.2 F (36.8 C)     Temp Source 06/05/15 0329 Oral     SpO2 06/05/15 0329 85 %     Weight 06/05/15 0329 204 lb (92.534 kg)     Height 06/05/15 0329 5\' 7"  (1.702 m)     Head Cir --      Peak Flow --      Pain Score 06/05/15 0329 8     Pain Loc --      Pain Edu? --      Excl. in [place]? --     Constitutional: Alert and oriented. Ill appearing and in moderate distress.  Eyes: Conjunctivae are normal. PERRL.  EOMI. patient appears to have some scleral icterus Head: Atraumatic. Nose: No congestion/rhinnorhea. Mouth/Throat: Mucous membranes are moist.  Oropharynx non-erythematous. Cardiovascular: Normal rate, regular rhythm. Grossly normal heart sounds.  Good peripheral circulation. Respiratory: Increased respiratory effort Gastrointestinal: Soft and diffusely tender with some significant distention and decreased bowel sounds. Musculoskeletal: Bilateral pitting lower extremity edema   Neurologic:  Normal speech and language.  Skin:  Skin is warm, dry and intact.  Psychiatric: Mood and affect are normal.   ____________________________________________   LABS (all labs ordered are listed, but only abnormal results are displayed)  Labs Reviewed  CBC - Abnormal; Notable for the following:    WBC 11.1 (*)    RBC 1.75 (*)    Hemoglobin 6.1 (*)    HCT 18.1 (*)    MCV 103.5 (*)    MCH 34.8 (*)    RDW 27.6 (*)    All other components within normal limits  COMPREHENSIVE METABOLIC PANEL - Abnormal; Notable for the following:    Chloride 100 (*)    Glucose, Bld 116 (*)    BUN 51 (*)    Creatinine, Ser 2.59 (*)    Albumin 3.2 (*)    Total Bilirubin 17.2 (*)    GFR calc non Af Amer 18 (*)    GFR calc Af Amer 21 (*)    All other components within normal limits  BRAIN NATRIURETIC PEPTIDE - Abnormal; Notable for the following:    B Natriuretic Peptide 683.0 (*)    All other components within normal limits  TROPONIN I  LACTIC ACID, PLASMA  BILIRUBIN, DIRECT   ____________________________________________  EKG  ED ECG REPORT I, Loney Hering, the attending physician, personally viewed and interpreted this ECG.   Date: 06/05/2015  EKG Time: 334  Rate: 116  Rhythm: atrial fibrillation, rate 116  Axis: Left axis deviation  Intervals:left anterior fascicular block and Incomplete right bundle branch block  ST&T Change:  None  ____________________________________________  RADIOLOGY  [place] abd: Mild hepatomegaly, small amount of abdominal ascites, S/p cholecystectomy  CXR: vascular congestion and cardiomegaly with mildly increased interstitial markings raising concern for mild interstitial edema ____________________________________________   PROCEDURES  Procedure(s) performed: None  Critical Care performed: Yes, see critical care note(s)  CRITICAL CARE Performed by: Charlesetta Ivory P   Total critical care time: 30 minutes  Critical care time was exclusive of separately billable procedures and treating other patients.  Critical care was necessary to treat or prevent imminent or life-threatening deterioration.  Critical care was time spent personally by me on the following activities: development of treatment plan with patient and/or surrogate as well as nursing, discussions with consultants, evaluation of patient's response to treatment, examination of patient, obtaining history from patient or surrogate, ordering and performing treatments and interventions, ordering and review of laboratory studies, ordering and review of radiographic studies, pulse oximetry and re-evaluation of patient's condition.   ____________________________________________   INITIAL IMPRESSION / ASSESSMENT AND PLAN / ED COURSE  Pertinent labs & imaging results that were available during my care of the patient were reviewed by me and considered in my medical decision making (see chart for details).  This is a 65 year old female who comes to the hospital today complaining of vomiting. The patient has a very distended abdomen and is in A. fib with RVR. The patient also has kidney failure and heart failure and had some hypoxia. I will do some blood work to include a BNP and  a troponin. I will also check a chest x-ray and the lactic acid. Prior to giving the patient fluid I will give her some Zofran and I will reassess the  patient once I received some of the blood work to determine the appropriate medication administration.  I gave the patient a dose of Lasix as she does have some pulmonary edema on chest x-ray. I also gave the patient 5 mg of diltiazem to see if that would help to lower her heart rate. Given the patient's history as well as her symptoms and her elevated bilirubin I will admit her to the hospitalist service. I will also check a reticulocyte count to see if the patient may be having a sickle cell crisis which is causing her elevated bilirubin. I will give a second dose of diltiazem. ____________________________________________   FINAL CLINICAL IMPRESSION(S) / ED DIAGNOSES  Final diagnoses:  Elevated bilirubin  Abdominal pain  Atrial fibrillation with rapid ventricular response ([place])  Acute pulmonary edema ([place])  Hypoxia      Loney Hering, MD 06/05/15 [phone]

## 2015-06-05 NOTE — ED Notes (Signed)
Pt assisted to the bathroom, pt back to bed resting, admitting MD at bedside

## 2015-06-05 NOTE — ED Notes (Signed)
Error in VS validate pulse not 46, see previous VS

## 2015-06-05 NOTE — ED Notes (Signed)
Pt to triage, via w/c with no distress noted, brought in by EMS; pt reports N/V since Sunday and pain "all over"

## 2015-06-05 NOTE — Telephone Encounter (Signed)
Telemetry unit contacted cancer center. Patient being admitted to 24. Dx: sickle cell anemia. Noralee Space, NP today.

## 2015-06-05 NOTE — Consult Note (Signed)
Jane Short  [phone]  [phone]     Clydie Dillen DOB: Sep 27, 1950  MR#: [id number]  XIP#:[id number]  Patient Care Team: Jackolyn Confer, MD as PCP - General (Internal Medicine) Seeplaputhur Robinette Haines, MD (General Surgery) Rubbie Battiest, NP as Nurse Practitioner (Gerontology)  CHIEF COMPLAINT:  Chief Complaint  Patient presents with  . Emesis   Patient with Sickle Cell Anemia. Followed by Dr. Ma Hillock previously.   INTERVAL HISTORY:  Jane Short is a 65 y.o. female who is known to our clinic with a history of sickle cell disease, chronic transfusion dependent anemia as needed. Since last night she's been having dry heaving with nausea, shortness of breath and chest tightness and presented to the emergency room. Chest x-ray shows pulmonary vascular congestion and possible CHF. She has no previous history of CHF. Also noted to be anemic, hgb of 6.1, with further elevated bilirubin. New onset atrial fibrillation with rapid ventricular response.  REVIEW OF SYSTEMS:   Review of Systems  Constitutional: Positive for malaise/fatigue and diaphoresis. Negative for fever, chills and weight loss.  Eyes: Negative.   Respiratory: Positive for cough, sputum production, shortness of breath and wheezing. Negative for hemoptysis.   Cardiovascular: Positive for leg swelling. Negative for chest pain, palpitations, orthopnea, claudication and PND.  Gastrointestinal: Negative for heartburn, nausea, vomiting, abdominal pain, diarrhea, constipation, blood in stool and melena.  Genitourinary: Negative.   Musculoskeletal: Positive for joint pain.  Skin: Negative.   Neurological: Positive for weakness. Negative for dizziness, tingling, focal weakness and seizures.  Endo/Heme/Allergies: Does not bruise/bleed easily.  Psychiatric/Behavioral: Negative for depression. The patient is not nervous/anxious and does not have insomnia.     As per HPI. Otherwise, a  complete review of systems is negatve.  PAST MEDICAL HISTORY: Past Medical History  Diagnosis Date  . Sickle cell anemia (HCC)     sickle cell disease  . Vitamin D deficiency   . Hypertension   . Osteoporosis 2011    osteopenia  . 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 Cheree D[facility] at [facility]  . Gout   . Sickle cell anemia (HCC)   . Lichen planus   . SCA-1 (spinocerebellar ataxia type 1) ([place])   . Lichen planus 67/07/4191  . COPD (chronic obstructive pulmonary disease) (HCC)     symptoms Dr. Patricia Pesa     PAST SURGICAL HISTORY: Past Surgical History  Procedure Laterality Date  . Tubal ligation    . Gallbladder surgery    . Colonoscopy  2014    [facility]    FAMILY HISTORY Family History  Problem Relation Age of Onset  . Heart disease Father   . Diabetes Father   . Diabetes Sister   . Cancer Mother     breast cancer  . Cancer Maternal Aunt     Breast cancer    GYNECOLOGIC HISTORY:  No LMP recorded. Patient is postmenopausal.     ADVANCED DIRECTIVES:    HEALTH MAINTENANCE: Social History  Substance Use Topics  . Smoking status: Former Smoker -- 0.25 packs/day for 30 years    Types: Cigarettes    Quit date: 04/29/2015  . Smokeless tobacco: Never Used     Comment: quit 1 month ago  . Alcohol Use: No    Allergies  Allergen Reactions  . Ramipril     cough    Current Facility-Administered Medications  Medication Dose Route Frequency Provider Last Rate Last Dose  . acetaminophen (TYLENOL) tablet 650  mg  650 mg Oral Q6H PRN Gladstone Lighter, MD   650 mg at 06/05/15 1126   Or  . acetaminophen (TYLENOL) suppository 650 mg  650 mg Rectal Q6H PRN Gladstone Lighter, MD      . allopurinol (ZYLOPRIM) tablet 100 mg  100 mg Oral Daily Gladstone Lighter, MD   100 mg at 06/05/15 1126  . antiseptic oral rinse (CPC / CETYLPYRIDINIUM CHLORIDE 0.05%) solution 7 mL  7 mL Mouth Rinse BID Gladstone Lighter, MD   7 mL at 06/05/15 1505  . calcitRIOL (ROCALTROL) capsule 0.25 mcg   0.25 mcg Oral Daily Gladstone Lighter, MD   0.25 mcg at 06/05/15 1126  . collagenase (SANTYL) ointment 1 application  1 application Topical Daily Gladstone Lighter, MD   1 application at 28/00/34 1130  . cyclobenzaprine (FLEXERIL) tablet 5 mg  5 mg Oral QHS Gladstone Lighter, MD      . folic acid (FOLVITE) tablet 1 mg  1 mg Oral Daily Gladstone Lighter, MD   1 mg at 06/05/15 1126  . furosemide (LASIX) injection 40 mg  40 mg Intravenous Q12H Gladstone Lighter, MD   0 mg at 06/05/15 0844  . HYDROcodone-acetaminophen (NORCO/VICODIN) 5-325 MG per tablet 1 tablet  1 tablet Oral Q6H PRN Gladstone Lighter, MD   1 tablet at 06/05/15 1700  . hydroxyurea (HYDREA) capsule 500 mg  500 mg Oral Daily Gladstone Lighter, MD   Stopped at 06/05/15 1200  . ipratropium-albuterol (DUONEB) 0.5-2.5 (3) MG/3ML nebulizer solution 3 mL  3 mL Nebulization Q4H Gladstone Lighter, MD   3 mL at 06/05/15 1515  . metoprolol tartrate (LOPRESSOR) tablet 25 mg  25 mg Oral 3 times per day Gladstone Lighter, MD   25 mg at 06/05/15 1457  . mometasone-formoterol (DULERA) 200-5 MCG/ACT inhaler 2 puff  2 puff Inhalation BID Gladstone Lighter, MD      . ondansetron (ZOFRAN) tablet 4 mg  4 mg Oral Q6H PRN Gladstone Lighter, MD       Or  . ondansetron (ZOFRAN) injection 4 mg  4 mg Intravenous Q6H PRN Gladstone Lighter, MD      . rosuvastatin (CRESTOR) tablet 5 mg  5 mg Oral Daily Gladstone Lighter, MD   5 mg at 06/05/15 1126  . sodium chloride 0.9 % injection 3 mL  3 mL Intravenous Q12H Gladstone Lighter, MD   3 mL at 06/05/15 1148   Facility-Administered Medications Ordered in Other Encounters  Medication Dose Route Frequency Provider Last Rate Last Dose  . heparin lock flush 100 unit/mL  250 Units Intracatheter PRN Leia Alf, MD      . sodium chloride 0.9 % injection 10 mL  10 mL Intravenous PRN Leia Alf, MD   10 mL at 02/20/15 1530  . sodium chloride 0.9 % injection 10 mL  10 mL Intravenous PRN Leia Alf, MD   10 mL at  02/20/15 1530  . sodium chloride 0.9 % injection 3 mL  3 mL Intracatheter PRN Leia Alf, MD   3 mL at 10/05/14 0851    OBJECTIVE: BP 101/44 mmHg  Pulse 100  Temp(Src) 97.4 F (36.3 C) (Oral)  Resp 18  Ht _0  (1.702 m)  Wt 202 lb 9.6 oz (91.899 kg)  BMI 31.72 kg/m2  SpO2 92%   Body mass index is 31.72 kg/(m^2).    ECOG FS:3 - Symptomatic, >50% confined to bed  General: Well-developed, well-nourished, sitting on side of bed in mild respiratory distress with complaints of pain in right flank. Eyes:  Pink conjunctiva, anicteric sclera. HEENT: Normocephalic, moist mucous membranes, clear oropharnyx. Lungs: Diminished breath sounds, Scattered wheezing, rhonchi present.  Heart: Tachycardia, irregular rhythm.  Abdomen: Soft, nontender, nondistended. No organomegaly noted, normoactive bowel sounds. Musculoskeletal: 1+ pedal edema, No cyanosis, or clubbing. Neuro: Alert, answering all questions appropriately. Cranial nerves grossly intact. Skin: No rashes or petechiae noted. Psych: A&O x3, Normal affect.   LAB RESULTS:  Admission on 06/05/2015  Component Date Value Ref Range Status  . WBC 06/05/2015 11.1* 3.6 - 11.0 K/uL Final  . RBC 06/05/2015 1.75* 3.80 - 5.20 MIL/uL Final  . Hemoglobin 06/05/2015 6.1* 12.0 - 16.0 g/dL Final  . HCT 06/05/2015 18.1* 35.0 - 47.0 % Final  . MCV 06/05/2015 103.5* 80.0 - 100.0 fL Final  . MCH 06/05/2015 34.8* 26.0 - 34.0 pg Final  . MCHC 06/05/2015 33.7  32.0 - 36.0 g/dL Final  . RDW 06/05/2015 27.6* 11.5 - 14.5 % Final  . Platelets 06/05/2015 269  150 - 440 K/uL Final  . Sodium 06/05/2015 135  135 - 145 mmol/L Final  . Potassium 06/05/2015 4.4  3.5 - 5.1 mmol/L Final  . Chloride 06/05/2015 100* 101 - 111 mmol/L Final  . CO2 06/05/2015 25  22 - 32 mmol/L Final  . Glucose, Bld 06/05/2015 116* 65 - 99 mg/dL Final  . BUN 06/05/2015 51* 6 - 20 mg/dL Final  . Creatinine, Ser 06/05/2015 2.59* 0.44 - 1.00 mg/dL Final   ICTERUS AT THIS LEVEL MAY  AFFECT RESULT  . Calcium 06/05/2015 10.2  8.9 - 10.3 mg/dL Final  . Total Protein 06/05/2015 6.9  6.5 - 8.1 g/dL Final  . Albumin 06/05/2015 3.2* 3.5 - 5.0 g/dL Final  . AST 06/05/2015 35  15 - 41 U/L Final  . ALT 06/05/2015 17  14 - 54 U/L Final  . Alkaline Phosphatase 06/05/2015 105  38 - 126 U/L Final  . Total Bilirubin 06/05/2015 17.2* 0.3 - 1.2 mg/dL Final  . GFR calc non Af Amer 06/05/2015 18* >60 mL/min Final  . GFR calc Af Amer 06/05/2015 21* >60 mL/min Final   Comment: (NOTE) The eGFR has been calculated using the CKD EPI equation. This calculation has not been validated in all clinical situations. eGFR's persistently <60 mL/min signify possible Chronic Kidney Disease.   . Anion gap 06/05/2015 10  5 - 15 Final  . Troponin I 06/05/2015 0.03  <0.031 ng/mL Final   Comment:        NO INDICATION OF MYOCARDIAL INJURY.   . Lactic Acid, Venous 06/05/2015 1.2  0.5 - 2.0 mmol/L Final  . B Natriuretic Peptide 06/05/2015 683.0* 0.0 - 100.0 pg/mL Final  . Bilirubin, Direct 06/05/2015 8.1* 0.1 - 0.5 mg/dL Final  . Order Confirmation 06/05/2015 ORDER PROCESSED BY BLOOD BANK   Final  . ABO/RH(D) 06/05/2015 O POS   Final  . Antibody Screen 06/05/2015 NEG   Final  . Sample Expiration 06/05/2015 06/08/2015   Final  . Unit Number 06/05/2015 [id number]   Final  . Blood Component Type 06/05/2015 RBC, LR IRR   Final  . Unit division 06/05/2015 00   Final  . Status of Unit 06/05/2015 ISSUED   Final  . Transfusion Status 06/05/2015 OK TO TRANSFUSE   Final  . Crossmatch Result 06/05/2015 Compatible   Final    STUDIES: Dg Chest Portable 1 View  06/05/2015  CLINICAL DATA:  Acute onset of nausea, vomiting and generalized chest pain. Initial encounter. EXAM: PORTABLE CHEST 1 VIEW COMPARISON:  Chest radiograph performed  12/30/2014, and CT of the chest performed 01/10/2015 FINDINGS: The lungs are well-aerated. Vascular congestion is noted, with mildly increased interstitial markings, raising  concern for mild interstitial edema. There is no evidence of pleural effusion or pneumothorax. The cardiomediastinal silhouette is enlarged. A right-sided chest port is noted ending about the proximal SVC. No acute osseous abnormalities are seen. IMPRESSION: Vascular congestion and cardiomegaly, with mildly increased interstitial markings, raising concern for mild interstitial edema. Electronically Signed   By: Garald Balding M.D.   On: 06/05/2015 04:28   US Abdomen Limited Ruq  06/05/2015  CLINICAL DATA:  Chronic generalized abdominal pain. Elevated bilirubin. Initial encounter. EXAM: US ABDOMEN LIMITED - RIGHT UPPER QUADRANT COMPARISON:  CT of the abdomen and pelvis from 05/17/2015 FINDINGS: Gallbladder: Status post cholecystectomy.  No retained stones seen. Common bile duct: Diameter: 0.3 cm, within normal limits in caliber. Liver: No focal lesion identified. The liver appears somewhat enlarged. The nodular contour of the liver is better characterized on recent CT. Pulsatile flow is noted within the portal vein. 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 A small amount of ascites is noted within all four quadrants of the abdomen. IMPRESSION: 1. Mild hepatomegaly. Nodular contour of the liver is better characterized on recent CT, compatible with mild hepatic cirrhosis. 2. Small amount of abdominal ascites noted. 3. Status post cholecystectomy.  No retained stones seen. Electronically Signed   By: Garald Balding M.D.   On: 06/05/2015 06:18    ASSESSMENT/ PLAN:  1. Sickle Cell Disease. 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 On Hydrea 138 mg daily and folic acid. Also has anemia of chronic renal insufficiency. Currently on Procrit as outpatient therapy and intermittent PRBC transfusion support when symptomatic. Would monitor for further transfusions unless patient is symptomatic unrelated to current CHF. Would continue with Hydrea. If she continues to worsen exchange  transfusion may be an alternative.   2. Elevated Bilirubin. Most likely related to hemolysis secondary to sickle cell crisis. Direct Bili is 8.1. [place] of liver revealed mild liver cirrhosis but no other obstruction. Continue with supportive care.    Dr. Grayland Ormond to continue to follow. Dr. Grayland Ormond was available for consultation and review of plan of care for this patient.   Jane Kanner, NP   06/05/2015 5:12 PM

## 2015-06-05 NOTE — H&P (Signed)
[place] NAME: Jane Short    MR#:  [id number]  DATE OF BIRTH:  Oct 20, 1950  DATE OF ADMISSION:  06/05/2015  PRIMARY CARE PHYSICIAN: [name], MD   REQUESTING/REFERRING PHYSICIAN: Dr. Charlesetta Ivory  CHIEF COMPLAINT:   Chief Complaint  Patient presents with  . Emesis    HISTORY OF PRESENT ILLNESS:  Jane Short  is a 65 y.o. female with a known history of sickle cell disease, chronic transfusion dependent anemia, CKD stage IV with baseline creatinine around 2, hypertension, osteoporosis, atrial myxoma presents to the hospital secondary to nausea and vomiting and dry heaving, associated with shortness of breath and chest tightness. Patient had a bad bronchitis attack about 2 months ago and had followed with Dr. Mortimer Fries at the time. She needed a few rounds of inhalers and nebulizers to improve over that. She also follows with cardiology further atrial myxoma. No prior history of congestive heart failure. Last transfusion was about a month ago for low hemoglobin. Baseline hemoglobin runs around 7. She follows up with Dr. Oliva Bustard at the cancer center. Her bilirubin about a month ago was elevated at 10, now increased up to 17. Denies any active bleeding. Continues to work at the [employer]. But lately has been sick. Denies any fevers or chills. No vomiting or diarrhea. Continues to take Lasix 60 mg every day, but feels that her frequency of urination and amount have decreased. Since last night she's been having dry heaving with nausea, resulted in shortness of breath and chest tightness and presented to the emergency room. Chest x-ray here showing pulmonary vascular congestion and possible CHF. Also noted to be anemic with further elevated bilirubin. New onset atrial fibrillation with rapid ventricular response.  PAST MEDICAL HISTORY:   Past Medical History  Diagnosis Date  . Sickle cell anemia (HCC)     sickle  cell disease  . Vitamin D deficiency   . Hypertension   . Osteoporosis 2011    osteopenia  . 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 Cheree Ditto at [facility]  . Gout   . Sickle cell anemia (HCC)   . Lichen planus   . SCA-1 (spinocerebellar ataxia type 1) ([place])   . Lichen planus [id number]  . COPD (chronic obstructive pulmonary disease) (HCC)     symptoms Dr. Patricia Pesa     PAST SURGICAL HISTORY:   Past Surgical History  Procedure Laterality Date  . Tubal ligation    . Gallbladder surgery    . Colonoscopy  2014    [facility]    SOCIAL HISTORY:   Social History  Substance Use Topics  . Smoking status: Former Smoker -- 0.25 packs/day for 30 years    Types: Cigarettes    Quit date: 04/29/2015  . Smokeless tobacco: Never Used     Comment: quit 1 month ago  . Alcohol Use: No    FAMILY HISTORY:   Family History  Problem Relation Age of Onset  . Heart disease Father   . Diabetes Father   . Diabetes Sister   . Cancer Mother     breast cancer  . Cancer Maternal Aunt     Breast cancer    DRUG ALLERGIES:   Allergies  Allergen Reactions  . Ramipril     cough    REVIEW OF SYSTEMS:   Review of Systems  Constitutional: Positive for malaise/fatigue. Negative for fever, chills and weight loss.  HENT: Negative for ear  discharge, ear pain, hearing loss, nosebleeds and tinnitus.   Eyes: Negative for blurred vision, double vision and photophobia.  Respiratory: Positive for shortness of breath. Negative for cough, hemoptysis and wheezing.   Cardiovascular: Positive for leg swelling. Negative for chest pain, palpitations and orthopnea.  Gastrointestinal: Positive for nausea and vomiting. Negative for heartburn, abdominal pain, diarrhea, constipation and melena.  Genitourinary: Positive for frequency. Negative for dysuria, urgency and hematuria.       Decreased frequency  Of urination  Musculoskeletal: Positive for joint pain. Negative for myalgias, back pain and neck pain.        Right hip pain  Skin: Negative for rash.  Neurological: Negative for dizziness, tingling, tremors, sensory change, speech change, focal weakness and headaches.  Endo/Heme/Allergies: Does not bruise/bleed easily.  Psychiatric/Behavioral: Negative for depression.    MEDICATIONS AT HOME:   Prior to Admission medications   Medication Sig Start Date End Date Taking? Authorizing Provider  albuterol (PROVENTIL HFA;VENTOLIN HFA) 108 (90 BASE) MCG/ACT inhaler Inhale 2 puffs into the lungs every 6 (six) hours as needed. 11/19/11  Yes Jackolyn Confer, MD  allopurinol (ZYLOPRIM) 100 MG tablet Take 100 mg by mouth daily.  06/09/12  Yes Historical Provider, MD  calcitRIOL (ROCALTROL) 0.25 MCG capsule Take 0.25 mcg by mouth daily.   Yes Historical Provider, MD  cyclobenzaprine (FLEXERIL) 5 MG tablet Take 1 tablet (5 mg total) by mouth at bedtime. 04/11/15  Yes Rubbie Battiest, NP  folic acid (FOLVITE) 1 MG tablet Take 1 mg by mouth daily.   Yes Historical Provider, MD  furosemide (LASIX) 20 MG tablet Take 60 mg by mouth 2 (two) times daily.    Yes Historical Provider, MD  hydroxyurea (HYDREA) 500 MG capsule Take 500 mg by mouth daily. May take with food to minimize GI side effects.   Yes Historical Provider, MD  losartan (COZAAR) 50 MG tablet Take 50 mg by mouth daily. 02/06/15 02/06/16 Yes Historical Provider, MD  mometasone-formoterol (DULERA) 200-5 MCG/ACT AERO Inhale 2 puffs into the lungs 2 (two) times daily. 01/29/15  Yes Flora Lipps, MD  rosuvastatin (CRESTOR) 5 MG tablet Take 1 tablet (5 mg total) by mouth daily. 01/18/15  Yes Jackolyn Confer, MD  SANTYL ointment Apply 1 application topically daily as needed.   Yes Historical Provider, MD  Umeclidinium Bromide (INCRUSE ELLIPTA) 62.5 MCG/INH AEPB Inhale 1 puff into the lungs daily. 05/08/15  Yes Flora Lipps, MD  silver sulfADIAZINE (SILVADENE) 1 % cream Apply 1 application topically 2 (two) times daily. Patient not taking: Reported on 06/05/2015 04/04/15    Evlyn Kanner, NP      VITAL SIGNS:  Blood pressure 139/79, pulse 104, temperature 98.2 F (36.8 C), temperature source Oral, resp. rate 20, height 5\' 7"  (1.702 m), weight 92.534 kg (204 lb), SpO2 94 %.  PHYSICAL EXAMINATION:   Physical Exam  GENERAL:  65 y.o.-year-old patient sitting in the bed with mild respiratory distress.  EYES: Pupils equal, round, reactive to light and accommodation. Icteric sclerae. Extraocular muscles intact.  HEENT: Head atraumatic, normocephalic. Oropharynx and nasopharynx clear.  NECK:  Supple, no jugular venous distention. No thyroid enlargement, no tenderness.  LUNGS: scant breath sounds, scattered wheeze and rales, no rhonchi or crepitations. No use of accessory muscles of respiration.  CARDIOVASCULAR: S1, S2 normal. No rubs, or gallops. 3/6 systolic murmur present ABDOMEN: Soft, nontender, distended abdomen. Bowel sounds present. No organomegaly or mass.  EXTREMITIES: No cyanosis, or clubbing. 1+ pedal edema present and healing ulcerations  on the legs, left shin ulceration in dressing NEUROLOGIC: Cranial nerves II through XII are intact. Muscle strength 5/5 in all extremities. Sensation intact. Gait not checked.  PSYCHIATRIC: The patient is alert and oriented x 3.  SKIN: No obvious rash, lesion, or ulcer.   LABORATORY PANEL:   CBC  Recent Labs Lab 06/05/15 0407  WBC 11.1*  HGB 6.1*  HCT 18.1*  PLT 269   ------------------------------------------------------------------------------------------------------------------  Chemistries   Recent Labs Lab 06/05/15 0407  NA 135  K 4.4  CL 100*  CO2 25  GLUCOSE 116*  BUN 51*  CREATININE 2.59*  CALCIUM 10.2  AST 35  ALT 17  ALKPHOS 105  BILITOT 17.2*   ------------------------------------------------------------------------------------------------------------------  Cardiac Enzymes  Recent Labs Lab 06/05/15 0407  TROPONINI 0.03    ------------------------------------------------------------------------------------------------------------------  RADIOLOGY:  Dg Chest Portable 1 View  06/05/2015  CLINICAL DATA:  Acute onset of nausea, vomiting and generalized chest pain. Initial encounter. EXAM: PORTABLE CHEST 1 VIEW COMPARISON:  Chest radiograph performed 12/30/2014, and CT of the chest performed 01/10/2015 FINDINGS: The lungs are well-aerated. Vascular congestion is noted, with mildly increased interstitial markings, raising concern for mild interstitial edema. There is no evidence of pleural effusion or pneumothorax. The cardiomediastinal silhouette is enlarged. A right-sided chest port is noted ending about the proximal SVC. No acute osseous abnormalities are seen. IMPRESSION: Vascular congestion and cardiomegaly, with mildly increased interstitial markings, raising concern for mild interstitial edema. Electronically Signed   By: Garald Balding M.D.   On: 06/05/2015 04:28   US Abdomen Limited Ruq  06/05/2015  CLINICAL DATA:  Chronic generalized abdominal pain. Elevated bilirubin. Initial encounter. EXAM: US ABDOMEN LIMITED - RIGHT UPPER QUADRANT COMPARISON:  CT of the abdomen and pelvis from 05/17/2015 FINDINGS: Gallbladder: Status post cholecystectomy.  No retained stones seen. Common bile duct: Diameter: 0.3 cm, within normal limits in caliber. Liver: No focal lesion identified. The liver appears somewhat enlarged. The nodular contour of the liver is better characterized on recent CT. Pulsatile flow is noted within the portal vein. 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 A small amount of ascites is noted within all four quadrants of the abdomen. IMPRESSION: 1. Mild hepatomegaly. Nodular contour of the liver is better characterized on recent CT, compatible with mild hepatic cirrhosis. 2. Small amount of abdominal ascites noted. 3. Status post cholecystectomy.  No retained stones seen. Electronically Signed   By:  Garald Balding M.D.   On: 06/05/2015 06:18    EKG:   Orders placed or performed during the hospital encounter of 06/05/15  . ED EKG  . ED EKG  . EKG 12-Lead  . EKG 12-Lead    IMPRESSION AND PLAN:   Maudell Korby  is a 65 y.o. female with a known history of sickle cell disease, chronic transfusion dependent anemia, CKD stage IV with baseline creatinine around 2, hypertension, osteoporosis, atrial myxoma presents to the hospital secondary to nausea and vomiting and dry heaving, associated with shortness of breath and chest tightness.  #1 acute hypoxic respiratory failure-secondary to acute pulmonary edema as noted on chest x-ray. -Echocardiogram is pending, admit to telemetry. -Cardiology consulted. Start on Lasix 40 mg  IV twice a day -Watch renal function due to acute renal failure on CK D -Strict I's and O's, daily weight checks, low-sodium diet -Continue 2 L oxygen. Also with recent bronchitis and scattered wheezing on exam, we'll continue inhalers and DuoNeb's  #2 atrial fibrillation with rapid ventricular response-likely triggered by hypoxia -Monitor on telemetry, started  on oral metoprolol since heart rate is around 100-110 range only. If needed will give when necessary Cardizem. -Echocardiogram is pending. -Cardiology consult is pending -Also known history of atrial myxoma.  #3 acute renal failure on CKD stage IV-follows with nephrology as outpatient. -Baseline creatinine around 2, now increased up to 2.5 -Monitor while on Lasix. Nephrology consulted. -Avoid nephrotoxins  #4 elevated bilirubin-third bilirubin ordered. Rule out hemolysis. -Reticulocyte count pending. Seems like hemoglobin has been steadily increasing since October. Hematology consult is pending. -Ultrasound of the abdomen with liver cirrhosis changes but no acute obstruction. No other LFTs are elevated.  #5 chronic transfusion dependent anemia-with history of sickle cell disease -Since hemoglobin low at 6,  we'll transfuse with 1 unit packed RBC. Bilirubin is elevated-direct bilirubin levels ordered. -Reticulocyte count is pending. -We'll get hematology consult  #6 hypertension-will be on metoprolol. Hold losartan with her acute renal failure. -Also on Lasix.  #7 DVT prophylaxis-Ted's and SCDs for now    All the records are reviewed and case discussed with ED provider. Management plans discussed with the patient, family and they are in agreement.  CODE STATUS: Full Code  TOTAL CRITICAL CARE TIME SPENT IN TAKING CARE OF THIS PATIENT: 60 minutes.    Gladstone Lighter M.D on 06/05/2015 at 8:02 AM  Between 7am to 6pm - Pager - [phone]  After 6pm go to [email or web address] - password [email or web address] [facility]  Office  [phone]  CC: Primary care physician; [name], MD

## 2015-06-05 NOTE — Progress Notes (Signed)
*  PRELIMINARY RESULTS* Echocardiogram 2D Echocardiogram has been performed.  Jane Short 06/05/2015, 1:48 PM

## 2015-06-05 NOTE — Progress Notes (Signed)
[facility]  ROUNDING NOTE   Subjective:   Admitted today for emesis.  Nephrology consulted for creatinine of 2.5 from baseline 2 Patient has not eaten well for several days. Has continued to take her furosemide.  Now with hypoxia and pulmonary edema.   Objective:  Vital signs in last 24 hours:  Temp:  [98.2 F (36.8 C)] 98.2 F (36.8 C) (01/04 0329) Pulse Rate:  [46-116] 108 (01/04 0930) Resp:  [15-22] 15 (01/04 0930) BP: (108-143)/(54-85) 135/74 mmHg (01/04 0930) SpO2:  [85 %-98 %] 97 % (01/04 0930) Weight:  [92.534 kg (204 lb)] 92.534 kg (204 lb) (01/04 0329)  Weight change:  Filed Weights   06/05/15 0329  Weight: 92.534 kg (204 lb)    Intake/Output:     Intake/Output this shift:     Physical Exam: General: Mild respiratory distress.   Head: Normocephalic, atraumatic. Moist oral mucosal membranes  Eyes: Anicteric, PERRL  Neck: Supple, trachea midline  Lungs:  Bilateral crackles  Heart: tachycardia  Abdomen:  Soft, nontender, +bowel sounds  Extremities:  trace peripheral edema.  Neurologic: Nonfocal, moving all four extremities  Skin: Ulcerated lesions bilateral lower extremities       Basic Metabolic Panel:  Recent Labs Lab 06/05/15 0407  NA 135  K 4.4  CL 100*  CO2 25  GLUCOSE 116*  BUN 51*  CREATININE 2.59*  CALCIUM 10.2    Liver Function Tests:  Recent Labs Lab 06/05/15 0407  AST 35  ALT 17  ALKPHOS 105  BILITOT 17.2*  PROT 6.9  ALBUMIN 3.2*   No results for input(s): LIPASE, AMYLASE in the last 168 hours. No results for input(s): AMMONIA in the last 168 hours.  CBC:  Recent Labs Lab 05/29/15 1422 06/05/15 0407  WBC  --  11.1*  HGB 6.3* 6.1*  HCT  --  18.1*  MCV  --  103.5*  PLT  --  269    Cardiac Enzymes:  Recent Labs Lab 06/05/15 0407  TROPONINI 0.03    BNP: Invalid input(s): POCBNP  CBG: No results for input(s): GLUCAP in the last 168 hours.  Microbiology: Results for orders placed or  performed in visit on 05/02/12  Urine culture     Status: None   Collection Time: 05/02/12  4:31 PM  Result Value Ref Range Status   Micro Text Report   Final       SOURCE: CLEAN CATCH    COMMENT                   NO GROWTH IN 36 HOURS   ANTIBIOTIC                                                        Coagulation Studies: No results for input(s): LABPROT, INR in the last 72 hours.  Urinalysis: No results for input(s): COLORURINE, LABSPEC, PHURINE, GLUCOSEU, HGBUR, BILIRUBINUR, KETONESUR, PROTEINUR, UROBILINOGEN, NITRITE, LEUKOCYTESUR in the last 72 hours.  Invalid input(s): APPERANCEUR    Imaging: Dg Chest Portable 1 View  06/05/2015  CLINICAL DATA:  Acute onset of nausea, vomiting and generalized chest pain. Initial encounter. EXAM: PORTABLE CHEST 1 VIEW COMPARISON:  Chest radiograph performed 12/30/2014, and CT of the chest performed 01/10/2015 FINDINGS: The lungs are well-aerated. Vascular congestion is noted, with mildly increased interstitial markings, raising concern for  mild interstitial edema. There is no evidence of pleural effusion or pneumothorax. The cardiomediastinal silhouette is enlarged. A right-sided chest port is noted ending about the proximal SVC. No acute osseous abnormalities are seen. IMPRESSION: Vascular congestion and cardiomegaly, with mildly increased interstitial markings, raising concern for mild interstitial edema. Electronically Signed   By: Garald Balding M.D.   On: 06/05/2015 04:28   US Abdomen Limited Ruq  06/05/2015  CLINICAL DATA:  Chronic generalized abdominal pain. Elevated bilirubin. Initial encounter. EXAM: US ABDOMEN LIMITED - RIGHT UPPER QUADRANT COMPARISON:  CT of the abdomen and pelvis from 05/17/2015 FINDINGS: Gallbladder: Status post cholecystectomy.  No retained stones seen. Common bile duct: Diameter: 0.3 cm, within normal limits in caliber. Liver: No focal lesion identified. The liver appears somewhat enlarged. The nodular contour of the  liver is better characterized on recent CT. Pulsatile flow is noted within the portal vein. 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 A small amount of ascites is noted within all four quadrants of the abdomen. IMPRESSION: 1. Mild hepatomegaly. Nodular contour of the liver is better characterized on recent CT, compatible with mild hepatic cirrhosis. 2. Small amount of abdominal ascites noted. 3. Status post cholecystectomy.  No retained stones seen. Electronically Signed   By: Garald Balding M.D.   On: 06/05/2015 06:18     Medications:     . sodium chloride   Intravenous Once  . furosemide  40 mg Intravenous Q12H  . metoprolol tartrate  25 mg Oral 3 times per day     Assessment/ Plan:  [name] is a 65 y.o. black female  with sickle cell disease, atrial myxoma who was admitted on 06/05/2015 for Acute pulmonary edema ([place]) [J81.0] Hypoxia [R09.02] Atrial fibrillation with rapid ventricular response ([place]) [I48.91] Elevated bilirubin [R17] Abdominal pain [R10.9]  1. Acute renal failure on Chronic Kidney Disease stage IV with hyperkalemia: acute renal failure due to severe anemia, hypoxia and prerenal azotemia from poor PO intake and vomiting.  Potassium is at goal. Creatinine of 2.59, baseline creatinine of 2.1, eGFR of 28 03/13/2015. Chronic Kidney Disease is secondary to NSAID use and sickle cell anemia.  - Renal function reviewed with patient. Discussed the role of furosemide.  - Low potassium diet  - Renally dose all agents. Avoid nephrotoxic agents.  - Continue furosemide. Monitor volume status.  - not on an ACE-I/ARB as outpatient due to hyperkalemia.   2. Sickle Cell Disease with anemia of chronic kidney disease: hemoglobin of 6.1 - transfusion and erythropoietin as per hematology - hydroxyurea as outpatient.  3. Hypertension: blood pressure at goal. With atrial fibrillation and rapid ventricular response   -  Furosemide and fluid restriction -  metoprolol for rate control.    4. Secondary Hyperparathyroidism: PTH 98 in 03/2015. - calcitriol as outpatient.    LOS: 0 Jane Short 1/4/20179:57 AM

## 2015-06-05 NOTE — Progress Notes (Signed)
Skin verified on admission by Tammy T.

## 2015-06-06 LAB — CBC
HEMATOCRIT: 21.1 % — AB (ref 35.0–47.0)
HEMOGLOBIN: 7 g/dL — AB (ref 12.0–16.0)
MCH: 32.8 pg (ref 26.0–34.0)
MCHC: 33.1 g/dL (ref 32.0–36.0)
MCV: 99.2 fL (ref 80.0–100.0)
PLATELETS: 247 10*3/uL (ref 150–440)
RBC: 2.13 MIL/uL — ABNORMAL LOW (ref 3.80–5.20)
RDW: 26.5 % — ABNORMAL HIGH (ref 11.5–14.5)
WBC: 10.7 10*3/uL (ref 3.6–11.0)

## 2015-06-06 LAB — HEPATIC FUNCTION PANEL
ALBUMIN: 3 g/dL — AB (ref 3.5–5.0)
ALT: 16 U/L (ref 14–54)
AST: 33 U/L (ref 15–41)
Alkaline Phosphatase: 103 U/L (ref 38–126)
BILIRUBIN DIRECT: 7.9 mg/dL — AB (ref 0.1–0.5)
Indirect Bilirubin: 7.9 mg/dL — ABNORMAL HIGH (ref 0.3–0.9)
TOTAL PROTEIN: 6.5 g/dL (ref 6.5–8.1)
Total Bilirubin: 15.8 mg/dL — ABNORMAL HIGH (ref 0.3–1.2)

## 2015-06-06 LAB — TYPE AND SCREEN
ABO/RH(D): O POS
Antibody Screen: NEGATIVE
Unit division: 0

## 2015-06-06 LAB — BASIC METABOLIC PANEL
ANION GAP: 9 (ref 5–15)
BUN: 62 mg/dL — ABNORMAL HIGH (ref 6–20)
CHLORIDE: 99 mmol/L — AB (ref 101–111)
CO2: 24 mmol/L (ref 22–32)
Calcium: 9.6 mg/dL (ref 8.9–10.3)
Creatinine, Ser: 3.86 mg/dL — ABNORMAL HIGH (ref 0.44–1.00)
GFR calc Af Amer: 13 mL/min — ABNORMAL LOW (ref >60)
GFR, EST NON AFRICAN AMERICAN: 11 mL/min — AB (ref >60)
GLUCOSE: 99 mg/dL (ref 65–99)
POTASSIUM: 5.1 mmol/L (ref 3.5–5.1)
Sodium: 132 mmol/L — ABNORMAL LOW (ref 135–145)

## 2015-06-06 NOTE — Progress Notes (Signed)
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 Hemoglobin was 6.1 on admission, patient received 1 unit of packed RBC, hemoglobin today is 7. Patient feels better. Due to worsening of creatinine, it was 2.5 and yesterday today it is 3,86.  CHIEF COMPLAINT:   Chief Complaint  Patient presents with  . Emesis    REVIEW OF SYSTEMS:   ROS CONSTITUTIONAL:  Generalized anasarca EYES: No blurred or double vision.  EARS, NOSE, AND THROAT: No tinnitus or ear pain.  RESPIRATORY: No cough, shortness of breath, wheezing or hemoptysis.  CARDIOVASCULAR: No chest pain, orthopnea, edema.  GASTROINTESTINAL: No nausea, vomiting, diarrhea or abdominal pain.  GENITOURINARY: No dysuria, hematuria.  ENDOCRINE: No polyuria, nocturia,  HEMATOLOGY: No anemia, easy bruising or bleeding SKIN: No rash or lesion. MUSCULOSKELETAL: No joint pain or arthritis.   NEUROLOGIC: No tingling, numbness, weakness.  PSYCHIATRY: No anxiety or depression.   DRUG ALLERGIES:   Allergies  Allergen Reactions  . Ramipril     cough    VITALS:  Blood pressure 115/65, pulse 91, temperature 97.5 F (36.4 C), temperature source Oral, resp. rate 18, height 5\' 7"  (1.702 m), weight 91.899 kg (202 lb 9.6 oz), SpO2 94 %.  PHYSICAL EXAMINATION:  GENERAL:  65 y.o.-year-old patient lying in the bed , generalized anasarca noted.  EYES: Pupils equal, round, reactive to light and accommodation. No scleral icterus. Extraocular muscles intact.  HEENT: Head atraumatic, normocephalic. Oropharynx and nasopharynx clear.  NECK:  Supple, no jugular venous distention. No thyroid enlargement, no tenderness.  LUNGS: Expiratory wheeze in all lung fields  bilaterally.Marland Kitchen CARDIOVASCULAR:irregularly irregular rhythm EN: Soft, nontender, nondistended. Bowel sounds present. No organomegaly or mass.  EXTREMITIES: No pedal edema, cyanosis, or clubbing.  NEUROLOGIC: Cranial nerves II through XII are intact. Muscle strength 5/5 in all extremities. Sensation intact. Gait not checked.  PSYCHIATRIC: The patient is alert and oriented x 3.  SKIN: No obvious rash, lesion, or ulcer.    LABORATORY PANEL:   CBC  Recent Labs Lab 06/06/15 0459  WBC 10.7  HGB 7.0*  HCT 21.1*  PLT 247   ------------------------------------------------------------------------------------------------------------------  Chemistries   Recent Labs Lab 06/06/15 0459  NA 132*  K 5.1  CL 99*  CO2 24  GLUCOSE 99  BUN 62*  CREATININE 3.86*  CALCIUM 9.6  AST 33  ALT 16  ALKPHOS 103  BILITOT 15.8*   ------------------------------------------------------------------------------------------------------------------  Cardiac Enzymes  Recent Labs Lab 06/05/15 0407  TROPONINI 0.03   ------------------------------------------------------------------------------------------------------------------  RADIOLOGY:  Dg Chest Portable 1 View  06/05/2015  CLINICAL DATA:  Acute onset of nausea, vomiting and generalized chest pain. Initial encounter. EXAM: PORTABLE CHEST 1 VIEW COMPARISON:  Chest radiograph performed 12/30/2014, and CT of the chest performed 01/10/2015 FINDINGS: The lungs are well-aerated. Vascular congestion is noted, with mildly increased interstitial markings, raising concern for mild interstitial edema. There is no evidence of pleural effusion or pneumothorax. The cardiomediastinal silhouette is enlarged. A right-sided chest port is noted ending about the proximal SVC. No acute osseous abnormalities are seen. IMPRESSION: Vascular congestion and cardiomegaly, with mildly increased interstitial markings, raising concern for mild interstitial edema. Electronically  Signed   By: Garald Balding M.D.   On: 06/05/2015 04:28   US Abdomen Limited Ruq  06/05/2015  CLINICAL DATA:  Chronic generalized abdominal pain. Elevated bilirubin. Initial encounter. EXAM: US ABDOMEN LIMITED - RIGHT UPPER QUADRANT COMPARISON:  CT of the abdomen and pelvis from 05/17/2015 FINDINGS: Gallbladder: Status post cholecystectomy.  No retained stones seen. Common bile duct: Diameter: 0.3 cm, within normal limits in caliber. Liver: No focal lesion identified. The liver appears somewhat enlarged. The nodular contour of the liver is better characterized on recent CT. Pulsatile flow is noted within the portal vein. 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 A small amount of ascites is noted within all four quadrants of the abdomen. IMPRESSION: 1. Mild hepatomegaly. Nodular contour of the liver is better characterized on recent CT, compatible with mild hepatic cirrhosis. 2. Small amount of abdominal ascites noted. 3. Status post cholecystectomy.  No retained stones seen. Electronically Signed   By: Garald Balding M.D.   On: 06/05/2015 06:18    EKG:   Orders placed or performed during the hospital encounter of 06/05/15  . ED EKG  . ED EKG  . EKG 12-Lead  . 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 Hemoglobin improved, anemia improved. Hemoglobin is around 7  #2 .acute hypoxic respiratory failure secondary to pulmonary edema: Continue Lasix. Continue oxygen. Continue nebulizers (h/o recent bronchtis_) #3.atrial fibrillation unable to use full anticoagulation secondary to anemia requiring transfusions. Use Cardizem as needed. Appreciate cardiology following. History of atrial myxoma. Follow echocardiogram results.  #4, Acute  renal failure on CKD  stage IV with  hyperkalemia: Patient acute renal failure is due to ATN with a severe anemia ,hypoxia. And prerenal; azotemiawith poor by mouth intake, vomiting: Chronic kidney disease secondary to NSAID use. Avoid nephrotoxic agents. Not on ACE inhibitor as our ER be secondary to hyperkalemia. Monitor volume status, continue furosemide, appreciate  nephrology following.  Renal Function is worsening today. #5 hypertension: Controlled.  6 history of atrial myxoma: Follows up with[facility] #7 history of sickle cell disease: Continue Hydrea, pain medications.   All the records are reviewed and case discussed with Care Management/Social Workerr. Management plans discussed with the patient, family and they are in agreement.  CODE STATUS:full  TOTAL TIME TAKING CARE OF THIS PATIENT: 24minutes.   POSSIBLE D/C IN  1-2 DAYS, DEPENDING ON CLINICAL CONDITION.   Epifanio Lesches M.D on 06/06/2015 at 9:25 AM  Between 7am to 6pm - Pager - [phone]  After 6pm go to [email or web address] - password [email or web address] [place]  Office  [phone]  CC: Primary care physician; [name], MD   Note: This dictation was prepared with Dragon dictation along with smaller phrase technology. Any transcriptional errors that result from this process are unintentional.

## 2015-06-06 NOTE — Progress Notes (Signed)
Initial Nutrition Assessment     INTERVENTION:   Meals and Snacks: Cater to patient preferences; currently on low sodium diet, may benefit from potassium and fluid restrictions, continue to monitor Education: provided brief verbal education on low sodium diet, pt verbalized understanding, declined further education at this time   NUTRITION DIAGNOSIS:   Reassess on follow-up  GOAL:   Patient will meet greater than or equal to 90% of their needs  MONITOR:    (Energy Intake, Anthropometrics, Electrolyte/Renal Profile, Digestive System)  REASON FOR ASSESSMENT:   Diagnosis    ASSESSMENT:    Pt admitted with acute on CKD IV with hyperkalemia, acute respiratory failure with acute pulmonary edema; pt with chronic anemia (transfusion dependent)  Past Medical History  Diagnosis Date  . Sickle cell anemia (HCC)     sickle cell disease  . Vitamin D deficiency   . Hypertension   . Osteoporosis 2011    osteopenia  . 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 Cheree [facility] at [facility]  . Gout   . Sickle cell anemia (HCC)   . Lichen planus   . SCA-1 (spinocerebellar ataxia type 1) ([place])   . Lichen planus [id number]  . COPD (chronic obstructive pulmonary disease) (HCC)     symptoms Dr. Patricia Pesa      Diet Order:  Diet 2 gram sodium Room service appropriate?: Yes; Fluid consistency:: Thin   Energy Intake: recorded po intake 100% of meals, pt reports good appetite at present  Food and nutrition related history: pt reports poor appetite for a few days prior to admission with N/V, prior to this pt reports good appetite  Electrolyte and Renal Profile:  Recent Labs Lab 06/05/15 0407 06/06/15 0459  BUN 51* 62*  CREATININE 2.59* 3.86*  NA 135 132*  K 4.4 5.1   Glucose Profile: No results for input(s): GLUCAP in the last 72 hours.  Meds: lasix, calcitriol  Skin:  Reviewed, no issues  Last BM:  1/2  Height:   Ht Readings from Last 1 Encounters:  06/05/15 5\' 7"  (1.702 m)     Weight: pt reports weight fluctuations due to fluid but no significant wt loss or gains; weight trend is up as per weight encounters   Wt Readings from Last 1 Encounters:  06/05/15 202 lb 9.6 oz (91.899 kg)    Wt Readings from Last 10 Encounters:  06/05/15 202 lb 9.6 oz (91.899 kg)  05/30/15 206 lb (93.441 kg)  05/08/15 207 lb (93.895 kg)  05/08/15 208 lb (94.348 kg)  04/11/15 199 lb 9.6 oz (90.538 kg)  04/03/15 194 lb 10.7 oz (88.3 kg)  03/06/15 190 lb 2 oz (86.24 kg)  02/11/15 195 lb (88.451 kg)  01/29/15 195 lb (88.451 kg)  01/17/15 197 lb (89.359 kg)    BMI:  Body mass index is 31.72 kg/(m^2).  LOW Care Level  Kerman Passey MS, [place], LDN [phone] Pager  [phone] Weekend/On-Call Pager

## 2015-06-06 NOTE — Consult Note (Signed)
[facility]  CARDIOLOGY CONSULT NOTE  Patient ID: Jane Short MRN: [id number] DOB/AGE: 65-Nov-1952 65 y.o.  Admit date: 06/05/2015 Referring Physician Dr. Vianne Bulls Primary Physician   Primary Cardiologist Dr. Clayborn Bigness Reason for Consultation chf  HPI: Patient is a 65 year old female with history of sickle cell  Disease as well as chronic kidney disease and hypertension.  She was admitted with progressive shortness of breath.  Chest x-ray revealed vascular congestion and cardiomegaly with mild increased interstitial markings consistent with mild interstitial edema.  Chest x-ray revealed atrial fibrillation with variable ventricular response.  Patient has had chronic transfusion-dependent anemia and presents with a hemoglobin of   6.1.  She is currently receiving a unit of blood.  Her main complaint shortness of breath and chest discomfort.  She appears to of ruled out for myocardial infarction.  Serum troponin 0.03.  She has a history of an atrial myxoma which is followed at [facility].   ROS Review of Systems - General ROS: positive for  - fatigue, chest pain and sob Respiratory ROS: positive for - shortness of breath Cardiovascular ROS: positive for - chest pain Gastrointestinal ROS: positive for - abdominal pain Musculoskeletal ROS: negative Neurological ROS: no TIA or stroke symptoms   Past Medical History  Diagnosis Date  . Sickle cell anemia (HCC)     sickle cell disease  . Vitamin D deficiency   . Hypertension   . Osteoporosis 2011    osteopenia  . 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 Cheree [facility]itto at [facility]  . Gout   . Sickle cell anemia (HCC)   . Lichen planus   . SCA-1 (spinocerebellar ataxia type 1) ([place])   . Lichen planus [id number]  . COPD (chronic obstructive pulmonary disease) (HCC)     symptoms Dr. Patricia Pesa     Family History  Problem Relation Age of Onset  . Heart disease Father   . Diabetes  Father   . Diabetes Sister   . Cancer Mother     breast cancer  . Cancer Maternal Aunt     Breast cancer    Social History   Social History  . Marital Status: Married    Spouse Name: N/A  . Number of Children: 1  . Years of Education: N/A   Occupational History  .      textiles   Social History Main Topics  . Smoking status: Former Smoker -- 0.25 packs/day for 30 years    Types: Cigarettes    Quit date: 04/29/2015  . Smokeless tobacco: Never Used     Comment: quit 1 month ago  . Alcohol Use: No  . Drug Use: No  . Sexual Activity: Not on file   Other Topics Concern  . Not on file   Social History Narrative   Regular Exercise -  NO   Daily Caffeine Use:  1 cup coffee   Lives with her sister.   Working as a Building control surveyor in [employer]          Past Surgical History  Procedure Laterality Date  . Tubal ligation    . Gallbladder surgery    . Colonoscopy  2014    [facility]     Prescriptions prior to admission  Medication Sig Dispense Refill Last Dose  . albuterol (PROVENTIL HFA;VENTOLIN HFA) 108 (90 BASE) MCG/ACT inhaler Inhale 2 puffs into the lungs every 6 (six) hours as needed. 18 g 6 unknown at  unknown  . allopurinol (ZYLOPRIM) 100 MG tablet Take 100 mg by mouth daily.    06/04/2015 at Unknown time  . calcitRIOL (ROCALTROL) 0.25 MCG capsule Take 0.25 mcg by mouth daily.   06/04/2015 at Unknown time  . cyclobenzaprine (FLEXERIL) 5 MG tablet Take 1 tablet (5 mg total) by mouth at bedtime. 30 tablet 1 06/04/2015 at Unknown time  . folic acid (FOLVITE) 1 MG tablet Take 1 mg by mouth daily.   06/04/2015 at Unknown time  . furosemide (LASIX) 20 MG tablet Take 60 mg by mouth 2 (two) times daily.    06/04/2015 at Unknown time  . hydroxyurea (HYDREA) 500 MG capsule Take 500 mg by mouth daily. May take with food to minimize GI side effects.   Past Week at Unknown time  . losartan (COZAAR) 50 MG tablet Take 50 mg by mouth daily.   06/04/2015 at Unknown time  .  mometasone-formoterol (DULERA) 200-5 MCG/ACT AERO Inhale 2 puffs into the lungs 2 (two) times daily. 1 Inhaler 12 06/04/2015 at Unknown time  . rosuvastatin (CRESTOR) 5 MG tablet Take 1 tablet (5 mg total) by mouth daily. 90 tablet 3 06/04/2015 at Unknown time  . SANTYL ointment Apply 1 application topically daily as needed.   06/04/2015 at Unknown time  . Umeclidinium Bromide (INCRUSE ELLIPTA) 62.5 MCG/INH AEPB Inhale 1 puff into the lungs daily. 30 each 5 06/04/2015 at Unknown time  . silver sulfADIAZINE (SILVADENE) 1 % cream Apply 1 application topically 2 (two) times daily. (Patient not taking: Reported on 06/05/2015) 50 g 0 Completed Course at Unknown time    Physical Exam: Blood pressure 90/47, pulse 91, temperature 97.5 F (36.4 C), temperature source Oral, resp. rate 20, height 5\' 7"  (1.702 m), weight 91.899 kg (202 lb 9.6 oz), SpO2 91 %.  General appearance: cooperative and mild distress Resp: diminished breath sounds bibasilar Cardio: irregularly irregular rhythm GI: soft, non-tender; bowel sounds normal; no masses,  no organomegaly Extremities: extremities normal, atraumatic, no cyanosis or edema Neurologic: Grossly normal Labs:   Lab Results  Component Value Date   WBC 10.7 06/06/2015   HGB 7.0* 06/06/2015   HCT 21.1* 06/06/2015   MCV 99.2 06/06/2015   PLT 247 06/06/2015    Recent Labs Lab 06/06/15 0459  NA 132*  K 5.1  CL 99*  CO2 24  BUN 62*  CREATININE 3.86*  CALCIUM 9.6  PROT 6.5  BILITOT 15.8*  ALKPHOS 103  ALT 16  AST 33  GLUCOSE 99   Lab Results  Component Value Date   TROPONINI 0.03 06/05/2015      Radiology:   Mild pulmonary edema EKG:  Atrial fibrillation controlled ventricular response  ASSESSMENT AND PLAN:   Patient with history of sickle disease with transfusion-dependent anemia admitted with chest pain shortness of breath and was noted to be significantly anemic with a hemoglobin of 6.1.  She is currently receiving blood transfusion.  EKG shows  atrial fibrillation with good rate control.  Patient is not a candidate for chronic anticoagulation due  To her transfusion-dependent anemia.  Will need to control rate.  Would agree with transfusing as you are doing.  Patient is ruled out for myocardial infarction.  Gentle diuresis is recommended.  Continue with Crestor for hyperlipidemia.  Echo is pending Signed: Teodoro Spray MD, [facility] 06/06/2015, 7:11 AM

## 2015-06-06 NOTE — Care Management (Signed)
Patient admitted with shortness of breath and cxr showed concern for congested heart failure.  Patient has chronic kidney disease.  Patient with history of sickle cell anemia.  She has been transfused with one unit of packed cells.  Nephrology has documented "hold lasix for now."   Lasix order is still active for 40mg   q 12h IV.  Discussed HF vest reading of 51 with Izora Gala RN.  Vest is going to be reapplied after patient finished dinner.  discussed nephrology recommendation and active lasix order.  She will relay this to patient's primary nurse Janett Billow

## 2015-06-06 NOTE — Progress Notes (Addendum)
CHF ReDS vest reading was 51.  Chest ruler size was 12.5   RN notified

## 2015-06-06 NOTE — Progress Notes (Signed)
[facility]  ROUNDING NOTE   Subjective:   Husband at bedside 1 unit PRBC transfused yesterday Furosemide 34m IV q12   Creatinine 3.86 (2.59)  Objective:  Vital signs in last 24 hours:  Temp:  [97.4 F (36.3 C)-98.3 F (36.8 C)] 97.5 F (36.4 C) (01/05 1207) Pulse Rate:  [80-100] 93 (01/05 1207) Resp:  [18-20] 18 (01/05 1207) BP: (90-115)/(44-65) 109/53 mmHg (01/05 1207) SpO2:  [91 %-100 %] 94 % (01/05 1207)  Weight change: -0.635 kg (-1 lb 6.4 oz) Filed Weights   06/05/15 0329 06/05/15 1001  Weight: 92.534 kg (204 lb) 91.899 kg (202 lb 9.6 oz)    Intake/Output: I/O last 3 completed shifts: In: 915.3 [P.O.:480; I.V.:39.3; Blood:396] Out: -    Intake/Output this shift:     Physical Exam: General: NAD  Head: Normocephalic, atraumatic. Moist oral mucosal membranes  Eyes: Anicteric, PERRL  Neck: Supple, trachea midline  Lungs:  Bilateral crackles  Heart: Regular rate  Abdomen:  Soft, nontender, +bowel sounds  Extremities:  trace peripheral edema.  Neurologic: Nonfocal, moving all four extremities  Skin: Ulcerated lesions bilateral lower extremities       Basic Metabolic Panel:  Recent Labs Lab 06/05/15 0407 06/06/15 0459  NA 135 132*  K 4.4 5.1  CL 100* 99*  CO2 25 24  GLUCOSE 116* 99  BUN 51* 62*  CREATININE 2.59* 3.86*  CALCIUM 10.2 9.6    Liver Function Tests:  Recent Labs Lab 06/05/15 0407 06/06/15 0459  AST 35 33  ALT 17 16  ALKPHOS 105 103  BILITOT 17.2* 15.8*  PROT 6.9 6.5  ALBUMIN 3.2* 3.0*   No results for input(s): LIPASE, AMYLASE in the last 168 hours. No results for input(s): AMMONIA in the last 168 hours.  CBC:  Recent Labs Lab 06/05/15 0407 06/06/15 0459  WBC 11.1* 10.7  HGB 6.1* 7.0*  HCT 18.1* 21.1*  MCV 103.5* 99.2  PLT 269 247    Cardiac Enzymes:  Recent Labs Lab 06/05/15 0407  TROPONINI 0.03    BNP: Invalid input(s): POCBNP  CBG: No results for input(s): GLUCAP in the last 168  hours.  Microbiology: Results for orders placed or performed in visit on 05/02/12  Urine culture     Status: None   Collection Time: 05/02/12  4:31 PM  Result Value Ref Range Status   Micro Text Report   Final       SOURCE: CLEAN CATCH    COMMENT                   NO GROWTH IN 36 HOURS   ANTIBIOTIC                                                        Coagulation Studies: No results for input(s): LABPROT, INR in the last 72 hours.  Urinalysis:  Recent Labs  06/05/15 1830  COLORURINE AMBER*  LABSPEC 1.010  PHURINE 5.0  GLUCOSEU NEGATIVE  HGBUR 1+*  BILIRUBINUR NEGATIVE  KETONESUR NEGATIVE  PROTEINUR 100*  NITRITE NEGATIVE  LEUKOCYTESUR NEGATIVE      Imaging: Dg Chest Portable 1 View  06/05/2015  CLINICAL DATA:  Acute onset of nausea, vomiting and generalized chest pain. Initial encounter. EXAM: PORTABLE CHEST 1 VIEW COMPARISON:  Chest radiograph performed 12/30/2014, and CT of the chest performed  01/10/2015 FINDINGS: The lungs are well-aerated. Vascular congestion is noted, with mildly increased interstitial markings, raising concern for mild interstitial edema. There is no evidence of pleural effusion or pneumothorax. The cardiomediastinal silhouette is enlarged. A right-sided chest port is noted ending about the proximal SVC. No acute osseous abnormalities are seen. IMPRESSION: Vascular congestion and cardiomegaly, with mildly increased interstitial markings, raising concern for mild interstitial edema. Electronically Signed   By: Garald Balding M.D.   On: 06/05/2015 04:28   US Abdomen Limited Ruq  06/05/2015  CLINICAL DATA:  Chronic generalized abdominal pain. Elevated bilirubin. Initial encounter. EXAM: US ABDOMEN LIMITED - RIGHT UPPER QUADRANT COMPARISON:  CT of the abdomen and pelvis from 05/17/2015 FINDINGS: Gallbladder: Status post cholecystectomy.  No retained stones seen. Common bile duct: Diameter: 0.3 cm, within normal limits in caliber. Liver: No focal lesion  identified. The liver appears somewhat enlarged. The nodular contour of the liver is better characterized on recent CT. Pulsatile flow is noted within the portal vein. 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 A small amount of ascites is noted within all four quadrants of the abdomen. IMPRESSION: 1. Mild hepatomegaly. Nodular contour of the liver is better characterized on recent CT, compatible with mild hepatic cirrhosis. 2. Small amount of abdominal ascites noted. 3. Status post cholecystectomy.  No retained stones seen. Electronically Signed   By: Garald Balding M.D.   On: 06/05/2015 06:18     Medications:     . allopurinol  100 mg Oral Daily  . antiseptic oral rinse  7 mL Mouth Rinse BID  . calcitRIOL  0.25 mcg Oral Daily  . collagenase  1 application Topical Daily  . cyclobenzaprine  5 mg Oral QHS  . folic acid  1 mg Oral Daily  . furosemide  40 mg Intravenous Q12H  . hydroxyurea  500 mg Oral Daily  . ipratropium-albuterol  3 mL Nebulization Q4H  . metoprolol tartrate  25 mg Oral 3 times per day  . mometasone-formoterol  2 puff Inhalation BID  . rosuvastatin  5 mg Oral Daily  . sodium chloride  3 mL Intravenous Q12H     Assessment/ Plan:  [name] is a 65 y.o. black female  with sickle cell disease, atrial myxoma who was admitted on 06/05/2015 for Acute pulmonary edema ([place]) [J81.0] Hypoxia [R09.02] Atrial fibrillation with rapid ventricular response ([place]) [I48.91] Elevated bilirubin [R17] Abdominal pain [R10.9]  1. Acute renal failure on Chronic Kidney Disease stage IV with hyperkalemia: acute renal failure due to severe anemia, hypoxia and prerenal azotemia from poor PO intake and vomiting.  Potassium is at goal. Creatinine of 2.59, baseline creatinine of 2.1, eGFR of 28 03/13/2015. Chronic Kidney Disease is secondary to NSAID use and sickle cell anemia.  - Renal function reviewed with patient. Discussed the role of furosemide. Will hold furosemide  now.  - Renally dose all agents. Avoid nephrotoxic agents.  - not on an ACE-I/ARB as outpatient due to hyperkalemia.   2. Sickle Cell Disease with anemia of chronic kidney disease: status post transfusion 1 unit PRBC 06/05/15 - transfusion and erythropoietin as per hematology - hydroxyurea as outpatient.  3. Hypertension: blood pressure at goal. With atrial fibrillation and rapid ventricular response on admission. Now at goal.  - metoprolol for rate control.  - appreciate cards input.    4. Secondary Hyperparathyroidism: PTH 98 in 03/2015. - calcitriol as outpatient.    LOS: 1 Jla Reynolds 1/5/20171:16 PM

## 2015-06-07 LAB — BASIC METABOLIC PANEL
Anion gap: 10 (ref 5–15)
BUN: 73 mg/dL — AB (ref 6–20)
CHLORIDE: 99 mmol/L — AB (ref 101–111)
CO2: 24 mmol/L (ref 22–32)
CREATININE: 3.97 mg/dL — AB (ref 0.44–1.00)
Calcium: 9.2 mg/dL (ref 8.9–10.3)
GFR calc Af Amer: 13 mL/min — ABNORMAL LOW (ref >60)
GFR calc non Af Amer: 11 mL/min — ABNORMAL LOW (ref >60)
Glucose, Bld: 105 mg/dL — ABNORMAL HIGH (ref 65–99)
POTASSIUM: 5.1 mmol/L (ref 3.5–5.1)
SODIUM: 133 mmol/L — AB (ref 135–145)

## 2015-06-07 MED ORDER — MENTHOL 3 MG MT LOZG: 1.0000 | LOZENGE | OROMUCOSAL | Status: DC | PRN

## 2015-06-07 MED ORDER — POLYETHYLENE GLYCOL 3350 17 G PO PACK
17.0000 g | PACK | Freq: Two times a day (BID) | ORAL | Status: DC
  Administered 2015-06-08 – 2015-06-17 (×9): 17 g via ORAL
  Filled 2015-06-07 (×20): qty 1

## 2015-06-07 MED ORDER — POLYETHYLENE GLYCOL 3350 17 G PO PACK: 17.0000 g | PACK | Freq: Two times a day (BID) | ORAL | Status: DC | PRN

## 2015-06-07 NOTE — Progress Notes (Signed)
[facility]  ROUNDING NOTE   Subjective:   Husband at bedside Furosemide 54m IV q12   Creatinine 3.97 (3.86) (2.59)  Objective:  Vital signs in last 24 hours:  Temp:  [97.5 F (36.4 C)-97.6 F (36.4 C)] 97.6 F (36.4 C) (01/05 2025) Pulse Rate:  [93-115] 112 (01/06 0735) Resp:  [18-24] 20 (01/06 0735) BP: (106-118)/(46-57) 118/57 mmHg (01/06 0735) SpO2:  [91 %-100 %] 96 % (01/06 0810)  Weight change:  Filed Weights   06/05/15 0329 06/05/15 1001  Weight: 92.534 kg (204 lb) 91.899 kg (202 lb 9.6 oz)    Intake/Output: I/O last 3 completed shifts: In: 360 [P.O.:360] Out: 1100 [Urine:1100]   Intake/Output this shift:  Total I/O In: 120 [P.O.:120] Out: 360 [Urine:360]  Physical Exam: General: NAD  Head: Normocephalic, atraumatic. Moist oral mucosal membranes  Eyes: Anicteric, PERRL  Neck: Supple, trachea midline  Lungs:  Bilateral crackles  Heart: irregular  Abdomen:  Soft, nontender, +bowel sounds  Extremities:  trace peripheral edema.  Neurologic: Nonfocal, moving all four extremities  Skin: Ulcerated lesions bilateral lower extremities       Basic Metabolic Panel:  Recent Labs Lab 06/05/15 0407 06/06/15 0459 06/07/15 0510  NA 135 132* 133*  K 4.4 5.1 5.1  CL 100* 99* 99*  CO2 25 24 24   GLUCOSE 116* 99 105*  BUN 51* 62* 73*  CREATININE 2.59* 3.86* 3.97*  CALCIUM 10.2 9.6 9.2    Liver Function Tests:  Recent Labs Lab 06/05/15 0407 06/06/15 0459  AST 35 33  ALT 17 16  ALKPHOS 105 103  BILITOT 17.2* 15.8*  PROT 6.9 6.5  ALBUMIN 3.2* 3.0*   No results for input(s): LIPASE, AMYLASE in the last 168 hours. No results for input(s): AMMONIA in the last 168 hours.  CBC:  Recent Labs Lab 06/05/15 0407 06/06/15 0459  WBC 11.1* 10.7  HGB 6.1* 7.0*  HCT 18.1* 21.1*  MCV 103.5* 99.2  PLT 269 247    Cardiac Enzymes:  Recent Labs Lab 06/05/15 0407  TROPONINI 0.03    BNP: Invalid input(s): POCBNP  CBG: No results  for input(s): GLUCAP in the last 168 hours.  Microbiology: Results for orders placed or performed in visit on 05/02/12  Urine culture     Status: None   Collection Time: 05/02/12  4:31 PM  Result Value Ref Range Status   Micro Text Report   Final       SOURCE: CLEAN CATCH    COMMENT                   NO GROWTH IN 36 HOURS   ANTIBIOTIC                                                        Coagulation Studies: No results for input(s): LABPROT, INR in the last 72 hours.  Urinalysis:  Recent Labs  06/05/15 1830  COLORURINE AMBER*  LABSPEC 1.010  PHURINE 5.0  GLUCOSEU NEGATIVE  HGBUR 1+*  BILIRUBINUR NEGATIVE  KETONESUR NEGATIVE  PROTEINUR 100*  NITRITE NEGATIVE  LEUKOCYTESUR NEGATIVE      Imaging: No results found.   Medications:     . allopurinol  100 mg Oral Daily  . antiseptic oral rinse  7 mL Mouth Rinse BID  . calcitRIOL  0.25 mcg Oral  Daily  . collagenase  1 application Topical Daily  . cyclobenzaprine  5 mg Oral QHS  . folic acid  1 mg Oral Daily  . hydroxyurea  500 mg Oral Daily  . ipratropium-albuterol  3 mL Nebulization Q4H  . metoprolol tartrate  25 mg Oral 3 times per day  . mometasone-formoterol  2 puff Inhalation BID  . rosuvastatin  5 mg Oral Daily  . sodium chloride  3 mL Intravenous Q12H     Assessment/ Plan:  Ms. Tangi Shroff is a 65 y.o. black female  with sickle cell disease, atrial myxoma who was admitted on 06/05/2015 for Acute pulmonary edema ([place]) [J81.0] Hypoxia [R09.02] Atrial fibrillation with rapid ventricular response ([employer]) [I48.91] Elevated bilirubin [R17] Abdominal pain [R10.9]  1. Acute renal failure on Chronic Kidney Disease stage IV with hyperkalemia: acute renal failure due to severe anemia, hypoxia and prerenal azotemia from poor PO intake and vomiting.  Potassium is at goal. Creatinine of 2.59, baseline creatinine of 2.1, eGFR of 28 03/13/2015. Chronic Kidney Disease is secondary to NSAID use and sickle cell  anemia.  - Renal function reviewed with patient. Discussed the role of furosemide.  - Will hold furosemide .  - Renally dose all agents. Avoid nephrotoxic agents.  - not on an ACE-I/ARB as outpatient due to hyperkalemia.   2. 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 Hemoglobin back to her baseline at 7.  - transfusion and erythropoietin as per hematology - hydroxyurea as outpatient.  3. Hypertension: blood pressure at goal. With atrial fibrillation and rapid ventricular response on admission.  - metoprolol for rate control.  - appreciate cards input. Echo reviewed with patient    4. Secondary Hyperparathyroidism: PTH 98 in 03/2015. Calcium at goal during admission.  - calcitriol  - PTH pending   LOS: 2 Irineo Gaulin 1/6/201711:49 AM

## 2015-06-07 NOTE — Progress Notes (Signed)
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 Hemoglobin was 6.1 on admission, patient received 1 unit of packed RBC, hemoglobin today is 7.  Admitting Function is worse today, hypoxia on room air saturating 81%. Requiring 3 L of oxygen.   CHIEF COMPLAINT:   Chief Complaint  Patient presents with  . Emesis    REVIEW OF SYSTEMS:   ROS CONSTITUTIONAL:  Generalized anasarca EYES: No blurred or double vision.  EARS, NOSE, AND THROAT: No tinnitus or ear pain.  RESPIRATORY: Has shortness of breath. CARDIOVASCULAR: No chest pain, orthopnea, edema.  GASTROINTESTINAL: No nausea, vomiting, diarrhea or abdominal pain.  GENITOURINARY: No dysuria, hematuria.  ENDOCRINE: No polyuria, nocturia,  HEMATOLOGY: No anemia, easy bruising or bleeding SKIN: No rash or lesion. MUSCULOSKELETAL: No joint pain or arthritis.   NEUROLOGIC: No tingling, numbness, weakness.  PSYCHIATRY: No anxiety or depression.   DRUG ALLERGIES:   Allergies  Allergen Reactions  . Ramipril     cough    VITALS:  Blood pressure 122/59, pulse 85, temperature 98.6 F (37 C), temperature source Oral, resp. rate 20, height 5\' 7"  (1.702 m), weight 91.899 kg (202 lb 9.6 oz), SpO2 94 %.  PHYSICAL EXAMINATION:  GENERAL:  65 y.o.-year-old patient lying in the bed , generalized anasarca noted.  EYES: Pupils equal, round, reactive to light and accommodation. No scleral icterus. Extraocular muscles intact.  HEENT: Head atraumatic, normocephalic. Oropharynx and nasopharynx clear.  NECK:  Supple, no jugular venous distention. No thyroid enlargement, no tenderness.  LUNGS: Expiratory wheeze in all lung fields bilaterally.Marland Kitchen CARDIOVASCULAR:irregularly irregular  rhythm EN: Soft, nontender, nondistended. Bowel sounds present. No organomegaly or mass.  EXTREMITIES: No pedal edema, cyanosis, or clubbing.  NEUROLOGIC: Cranial nerves II through XII are intact. Muscle strength 5/5 in all extremities. Sensation intact. Gait not checked.  PSYCHIATRIC: The patient is alert and oriented x 3.  SKIN: No obvious rash, lesion, or ulcer.    LABORATORY PANEL:   CBC  Recent Labs Lab 06/06/15 0459  WBC 10.7  HGB 7.0*  HCT 21.1*  PLT 247   ------------------------------------------------------------------------------------------------------------------  Chemistries   Recent Labs Lab 06/06/15 0459 06/07/15 0510  NA 132* 133*  K 5.1 5.1  CL 99* 99*  CO2 24 24  GLUCOSE 99 105*  BUN 62* 73*  CREATININE 3.86* 3.97*  CALCIUM 9.6 9.2  AST 33  --   ALT 16  --   ALKPHOS 103  --   BILITOT 15.8*  --    ------------------------------------------------------------------------------------------------------------------  Cardiac Enzymes  Recent Labs Lab 06/05/15 0407  TROPONINI 0.03   ------------------------------------------------------------------------------------------------------------------  RADIOLOGY:  No results found.  EKG:   Orders placed or performed during the hospital encounter of 06/05/15  . ED EKG  . ED EKG  . EKG 12-Lead  . 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 Hemoglobin improved, anemia improved. Hemoglobin is around 7  #2 .acute hypoxic respiratory failure secondary to pulmonary edema: IV Lasix was stopped secondary to worsening kidney function. Continue oxygen. Difficult situation because Lasix will  help with this but Lasix is stopped secondary to renal failure. Patient may need intubation if the  respiratory failure gets worse.  History of atrial myxoma. Cardiogram showed EF 55%. .  #4, Acute  renal failure on CKD  stage IV with hyperkalemia: Patient acute renal failure is due to ATN with a severe anemia ,hypoxia. And prerenal; azotemiawith poor by mouth intake, vomiting: Chronic kidney disease secondary to NSAID use. Avoid nephrotoxic agents. Not on ACE inhibitor  Or ARB secondary to hyperkalemia.  Hold furosemide, monitor kidney function closely. Nephrology is following. #5 hypertension: Controlled.  6 history of atrial myxoma: Follows up with[facility] #7 history of sickle cell disease: Continue Hydrea, pain medications. Acute hypoxia: Psyche secondary to volume overload and also renal failure: Check x-ray of the chest, patient is at high risk for intubation.  All the records are reviewed and case discussed with Care Management/Social Workerr. Management plans discussed with the patient, family and they are in agreement.  CODE STATUS:full  TOTAL TIME TAKING CARE OF THIS PATIENT: 45minutes.   POSSIBLE D/C IN  1-2 DAYS, DEPENDING ON CLINICAL CONDITION.   Epifanio Lesches M.D on 06/07/2015 at 12:45 PM  Between 7am to 6pm - Pager - [phone]  After 6pm go to [email or web address] - password [email or web address] [facility]  Office  [phone]  CC: Primary care physician; [name], MD   Note: This dictation was prepared with Dragon dictation along with smaller phrase technology. Any transcriptional errors that result from this process are unintentional.

## 2015-06-07 NOTE — Progress Notes (Signed)
Initial appointment at the [facility] scheduled for July 01, 2015 at 10:00am. Thank you.

## 2015-06-07 NOTE — Discharge Instructions (Signed)
[facility] appointment on July 01, 2015 at 10:00am with Darylene Price, [place]. Please call [phone] to reschedule.

## 2015-06-07 NOTE — Care Management (Signed)
Continue to anticipate that this patient will require home 02 at discharge. Attending is firm in statement that patient will not be stable for discharge over the weekend so did not obtain an order for home 02.

## 2015-06-07 NOTE — Progress Notes (Signed)
A&O. Up with one assist. Short of breath with exertion. IV lasix given. On 2L O2.

## 2015-06-08 LAB — BASIC METABOLIC PANEL
ANION GAP: 9 (ref 5–15)
BUN: 75 mg/dL — ABNORMAL HIGH (ref 6–20)
CALCIUM: 9.3 mg/dL (ref 8.9–10.3)
CO2: 25 mmol/L (ref 22–32)
Chloride: 97 mmol/L — ABNORMAL LOW (ref 101–111)
Creatinine, Ser: 3.68 mg/dL — ABNORMAL HIGH (ref 0.44–1.00)
GFR, EST AFRICAN AMERICAN: 14 mL/min — AB (ref >60)
GFR, EST NON AFRICAN AMERICAN: 12 mL/min — AB (ref >60)
GLUCOSE: 103 mg/dL — AB (ref 65–99)
POTASSIUM: 5.8 mmol/L — AB (ref 3.5–5.1)
SODIUM: 131 mmol/L — AB (ref 135–145)

## 2015-06-08 LAB — PARATHYROID HORMONE, INTACT (NO CA): PTH: 250 pg/mL — AB (ref 15–65)

## 2015-06-08 MED ORDER — IPRATROPIUM-ALBUTEROL 0.5-2.5 (3) MG/3ML IN SOLN
3.0000 mL | Freq: Four times a day (QID) | RESPIRATORY_TRACT | Status: DC
  Administered 2015-06-08 – 2015-06-15 (×25): 3 mL via RESPIRATORY_TRACT
  Filled 2015-06-08 (×26): qty 3

## 2015-06-08 NOTE — Progress Notes (Signed)
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 Hemoglobin was 6.1 on admission, patient received 1 unit of packed RBC, hemoglobin improved to 7. Patient hypoxia improved today. On 2 L of oxygen. She says she feels better.. Labs are   pending from today.   CHIEF COMPLAINT:   Chief Complaint  Patient presents with  . Emesis    REVIEW OF SYSTEMS:   ROS CONSTITUTIONAL:  Generalized anasarca EYES: No blurred or double vision.  EARS, NOSE, AND THROAT: No tinnitus or ear pain.  RESPIRATORY: Has shortness of breath. CARDIOVASCULAR: No chest pain, orthopnea, has pedal edema.  GASTROINTESTINAL: No nausea, vomiting, diarrhea or abdominal pain.  GENITOURINARY: No dysuria, hematuria.  ENDOCRINE: No polyuria, nocturia,  HEMATOLOGY: No anemia, easy bruising or bleeding SKIN: No rash or lesion. MUSCULOSKELETAL: No joint pain or arthritis.   NEUROLOGIC: No tingling, numbness, weakness.  PSYCHIATRY: No anxiety or depression.   DRUG ALLERGIES:   Allergies  Allergen Reactions  . Ramipril     cough    VITALS:  Blood pressure 97/52, pulse 92, temperature 97.8 F (36.6 C), temperature source Oral, resp. rate 21, height 5\' 7"  (1.702 m), weight 91.899 kg (202 lb 9.6 oz), SpO2 94 %.  PHYSICAL EXAMINATION:  GENERAL:  65 y.o.-year-old patient lying in the bed , generalized anasarca noted.  EYES: Pupils equal, round, reactive to light and accommodation. No scleral icterus. Extraocular muscles intact.  HEENT: Head atraumatic, normocephalic. Oropharynx and nasopharynx clear.  NECK:  Supple, no jugular venous distention. No thyroid enlargement, no tenderness.  LUNGS decreased  breath sounds bilaterally. CARDIOVASCULAR:irregularly  irregular rhythm EN: Soft, nontender, nondistended. Bowel sounds present. No organomegaly or mass.  EXTREMITIES has pedal edema.  NEUROLOGIC: Cranial nerves II through XII are intact. Muscle strength 5/5 in all extremities. Sensation intact. Gait not checked.  PSYCHIATRIC: The patient is alert and oriented x 3.  SKIN: No obvious rash, lesion, or ulcer.    LABORATORY PANEL:   CBC  Recent Labs Lab 06/06/15 0459  WBC 10.7  HGB 7.0*  HCT 21.1*  PLT 247   ------------------------------------------------------------------------------------------------------------------  Chemistries   Recent Labs Lab 06/06/15 0459 06/07/15 0510  NA 132* 133*  K 5.1 5.1  CL 99* 99*  CO2 24 24  GLUCOSE 99 105*  BUN 62* 73*  CREATININE 3.86* 3.97*  CALCIUM 9.6 9.2  AST 33  --   ALT 16  --   ALKPHOS 103  --   BILITOT 15.8*  --    ------------------------------------------------------------------------------------------------------------------  Cardiac Enzymes  Recent Labs Lab 06/05/15 0407  TROPONINI 0.03   ------------------------------------------------------------------------------------------------------------------  RADIOLOGY:  No results found.  EKG:   Orders placed or performed during the hospital encounter of 06/05/15  . ED EKG  . ED EKG  . EKG 12-Lead  . 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 Hemoglobin improved, anemia improved. Hemoglobin is around 7  #2 .acute hypoxic respiratory failure secondary to pulmonary edema: IV Lasix was stopped secondary to worsening kidney function. Continue oxygen. Difficult situation because Lasix  will help with this but Lasix is stopped secondary to renal failure. Labs are pending and kidney function today. Hypoxia is  improving,down  To 2 litres of o2. History of atrial myxoma. echoCardiogram showed EF 55%. .  #4, Acute  renal failure on CKD  stage IV with hyperkalemia: Patient acute renal failure is due to ATN with a severe anemia ,hypoxia. And prerenal; azotemiawith poor by mouth intake, vomiting: Chronic kidney disease secondary to NSAID use. Avoid nephrotoxic agents. Not on ACE inhibitor  Or ARB secondary to hyperkalemia.  Hold furosemide, monitor kidney function closely. Nephrology is following. #5 hypertension: Controlled.  6 history of atrial myxoma: Follows up with[facility] #7 history of sickle cell disease: Continue Hydrea, pain medications. Acute hypoxia: secondary to volume overload and also renal failure: Improving. All the records are reviewed and case discussed with Care Management/Social Workerr. Management plans discussed with the patient, family and they are in agreement.  CODE STATUS:full  TOTAL TIME TAKING CARE OF THIS PATIENT: 73minutes.   POSSIBLE D/C IN  1-2 DAYS, DEPENDING ON CLINICAL CONDITION.   Epifanio Lesches M.D on 06/08/2015 at 10:57 AM  Between 7am to 6pm - Pager - [phone]  After 6pm go to [email or web address] - password [email or web address] [place] [facility]  Office  [phone]  CC: Primary care physician; [name], MD   Note: This dictation was prepared with Dragon dictation along with smaller phrase technology. Any transcriptional errors that result from this process are unintentional.

## 2015-06-08 NOTE — Progress Notes (Signed)
[facility]  ROUNDING NOTE   Subjective:   Husband at bedside Off furosemide.  UOP 1560 Creatinine 3.68 (3.97) (3.86) (2.59)  Objective:  Vital signs in last 24 hours:  Temp:  [97.7 F (36.5 C)-97.9 F (36.6 C)] 97.9 F (36.6 C) (01/07 1109) Pulse Rate:  [91-98] 91 (01/07 1109) Resp:  [20-21] 20 (01/07 1109) BP: (97-124)/(52-62) 124/62 mmHg (01/07 1109) SpO2:  [94 %-100 %] 100 % (01/07 1109)  Weight change:  Filed Weights   06/05/15 0329 06/05/15 1001  Weight: 92.534 kg (204 lb) 91.899 kg (202 lb 9.6 oz)    Intake/Output: I/O last 3 completed shifts: In: 480 [P.O.:480] Out: 2260 [Urine:2260]   Intake/Output this shift:  Total I/O In: 360 [P.O.:360] Out: 300 [Urine:300]  Physical Exam: General: NAD  Head: Normocephalic, atraumatic. Moist oral mucosal membranes  Eyes: Anicteric, PERRL  Neck: Supple, trachea midline  Lungs:  Bilateral crackles  Heart: irregular  Abdomen:  Soft, nontender, +bowel sounds  Extremities:  trace peripheral edema.  Neurologic: Nonfocal, moving all four extremities  Skin: Ulcerated lesions bilateral lower extremities       Basic Metabolic Panel:  Recent Labs Lab 06/05/15 0407 06/06/15 0459 06/07/15 0510 06/08/15 0936  NA 135 132* 133* 131*  K 4.4 5.1 5.1 5.8*  CL 100* 99* 99* 97*  CO2 25 24 24 25   GLUCOSE 116* 99 105* 103*  BUN 51* 62* 73* 75*  CREATININE 2.59* 3.86* 3.97* 3.68*  CALCIUM 10.2 9.6 9.2 9.3    Liver Function Tests:  Recent Labs Lab 06/05/15 0407 06/06/15 0459  AST 35 33  ALT 17 16  ALKPHOS 105 103  BILITOT 17.2* 15.8*  PROT 6.9 6.5  ALBUMIN 3.2* 3.0*   No results for input(s): LIPASE, AMYLASE in the last 168 hours. No results for input(s): AMMONIA in the last 168 hours.  CBC:  Recent Labs Lab 06/05/15 0407 06/06/15 0459  WBC 11.1* 10.7  HGB 6.1* 7.0*  HCT 18.1* 21.1*  MCV 103.5* 99.2  PLT 269 247    Cardiac Enzymes:  Recent Labs Lab 06/05/15 0407  TROPONINI 0.03     BNP: Invalid input(s): POCBNP  CBG: No results for input(s): GLUCAP in the last 168 hours.  Microbiology: Results for orders placed or performed in visit on 05/02/12  Urine culture     Status: None   Collection Time: 05/02/12  4:31 PM  Result Value Ref Range Status   Micro Text Report   Final       SOURCE: CLEAN CATCH    COMMENT                   NO GROWTH IN 36 HOURS   ANTIBIOTIC                                                        Coagulation Studies: No results for input(s): LABPROT, INR in the last 72 hours.  Urinalysis:  Recent Labs  06/05/15 1830  COLORURINE AMBER*  LABSPEC 1.010  PHURINE 5.0  GLUCOSEU NEGATIVE  HGBUR 1+*  BILIRUBINUR NEGATIVE  KETONESUR NEGATIVE  PROTEINUR 100*  NITRITE NEGATIVE  LEUKOCYTESUR NEGATIVE      Imaging: No results found.   Medications:     . allopurinol  100 mg Oral Daily  . antiseptic oral rinse  7 mL  Mouth Rinse BID  . calcitRIOL  0.25 mcg Oral Daily  . collagenase  1 application Topical Daily  . cyclobenzaprine  5 mg Oral QHS  . folic acid  1 mg Oral Daily  . hydroxyurea  500 mg Oral Daily  . ipratropium-albuterol  3 mL Nebulization Q6H  . metoprolol tartrate  25 mg Oral 3 times per day  . mometasone-formoterol  2 puff Inhalation BID  . polyethylene glycol  17 g Oral BID  . rosuvastatin  5 mg Oral Daily  . sodium chloride  3 mL Intravenous Q12H     Assessment/ Plan:  Jane Short is a 65 y.o. black female  with sickle cell disease, atrial myxoma who was admitted on 06/05/2015 for Acute pulmonary edema ([place]) [J81.0] Hypoxia [R09.02] Atrial fibrillation with rapid ventricular response ([place]) [I48.91] Elevated bilirubin [R17] Abdominal pain [R10.9]  1. Acute renal failure on Chronic Kidney Disease stage IV with hyperkalemia: acute renal failure due to severe anemia, hypoxia and prerenal azotemia from poor PO intake and vomiting.  Potassium is at goal. Creatinine of 2.59, baseline creatinine  of 2.1, eGFR of 28 03/13/2015. Chronic Kidney Disease is secondary to NSAID use and sickle cell anemia.  - Renal function reviewed with patient. Discussed the role of furosemide.  - Will hold furosemide .  - Renally dose all agents. Avoid nephrotoxic agents.  - not on an ACE-I/ARB as outpatient due to hyperkalemia.   2. 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 Hemoglobin back to her baseline at 7.  - transfusion and erythropoietin as per hematology - hydroxyurea as outpatient.  3. Hypertension: blood pressure at goal. With atrial fibrillation and rapid ventricular response on admission.  - metoprolol for rate control.  - appreciate cards input. Echo reviewed with patient    4. Secondary Hyperparathyroidism: PTH 250. Calcium at goal during admission.  - calcitriol    LOS: [place], [place] 1/7/20174:13 PM

## 2015-06-08 NOTE — Progress Notes (Signed)
A&O. Up with one assist to [facility]. Medicated once for leg pain with vicodin. Slept well through the night. O2 weaned to 2L, Dressing on leg CDI.

## 2015-06-09 ENCOUNTER — Inpatient Hospital Stay: Payer: BLUE CROSS/BLUE SHIELD

## 2015-06-09 LAB — BASIC METABOLIC PANEL
ANION GAP: 7 (ref 5–15)
ANION GAP: 10 (ref 5–15)
BUN: 84 mg/dL — ABNORMAL HIGH (ref 6–20)
BUN: 83 mg/dL — ABNORMAL HIGH (ref 6–20)
CALCIUM: 9.4 mg/dL (ref 8.9–10.3)
CO2: 24 mmol/L (ref 22–32)
CO2: 25 mmol/L (ref 22–32)
Calcium: 9.5 mg/dL (ref 8.9–10.3)
Chloride: 100 mmol/L — ABNORMAL LOW (ref 101–111)
Chloride: 99 mmol/L — ABNORMAL LOW (ref 101–111)
Creatinine, Ser: 3.56 mg/dL — ABNORMAL HIGH (ref 0.44–1.00)
Creatinine, Ser: 3.26 mg/dL — ABNORMAL HIGH (ref 0.44–1.00)
GFR calc non Af Amer: 14 mL/min — ABNORMAL LOW (ref >60)
GFR, EST AFRICAN AMERICAN: 15 mL/min — AB (ref >60)
GFR, EST AFRICAN AMERICAN: 16 mL/min — AB (ref >60)
GFR, EST NON AFRICAN AMERICAN: 13 mL/min — AB (ref >60)
Glucose, Bld: 112 mg/dL — ABNORMAL HIGH (ref 65–99)
Glucose, Bld: 142 mg/dL — ABNORMAL HIGH (ref 65–99)
Potassium: 6.4 mmol/L — ABNORMAL HIGH (ref 3.5–5.1)
Potassium: 5.9 mmol/L — ABNORMAL HIGH (ref 3.5–5.1)
SODIUM: 131 mmol/L — AB (ref 135–145)
Sodium: 134 mmol/L — ABNORMAL LOW (ref 135–145)

## 2015-06-09 MED ORDER — SODIUM POLYSTYRENE SULFONATE 15 GM/60ML PO SUSP
30.0000 g | Freq: Once | ORAL | Status: AC
  Administered 2015-06-09: 30 g via ORAL
  Filled 2015-06-09: qty 120

## 2015-06-09 MED ORDER — METHYLPREDNISOLONE SODIUM SUCC 125 MG IJ SOLR
60.0000 mg | INTRAMUSCULAR | Status: DC
  Administered 2015-06-09 – 2015-06-11 (×3): 60 mg via INTRAVENOUS
  Filled 2015-06-09 (×3): qty 2

## 2015-06-09 MED ORDER — PENTAFLUOROPROP-TETRAFLUOROETH EX AERO
INHALATION_SPRAY | CUTANEOUS | Status: DC | PRN
  Filled 2015-06-09: qty 103.5

## 2015-06-09 MED ORDER — FUROSEMIDE 10 MG/ML IJ SOLN
40.0000 mg | Freq: Once | INTRAMUSCULAR | Status: AC
Start: 2015-06-09 — End: 2015-06-09
  Administered 2015-06-09: 40 mg via INTRAVENOUS
  Filled 2015-06-09: qty 4

## 2015-06-09 MED ORDER — SODIUM POLYSTYRENE SULFONATE 15 GM/60ML PO SUSP
30.0000 g | Freq: Once | ORAL | Status: AC
Start: 2015-06-09 — End: 2015-06-09
  Administered 2015-06-09: 30 g via ORAL
  Filled 2015-06-09: qty 120

## 2015-06-09 MED ORDER — SODIUM CHLORIDE 0.9 % IJ SOLN
10.0000 mL | Freq: Two times a day (BID) | INTRAMUSCULAR | Status: DC
  Administered 2015-06-09 – 2015-06-13 (×5): 10 mL

## 2015-06-09 MED ORDER — SODIUM CHLORIDE 0.9 % IV SOLN
1.0000 g | Freq: Once | INTRAVENOUS | Status: AC
  Administered 2015-06-09: 1 g via INTRAVENOUS
  Filled 2015-06-09: qty 10

## 2015-06-09 NOTE — Progress Notes (Signed)
[facility]  ROUNDING NOTE   Subjective:   Husband at bedside Potassium 6.4  - kayexalate, calcium gluconate and furosemide ordered Creatinine 3.56 (3.68) (3.97) (3.86) (2.59)  Objective:  Vital signs in last 24 hours:  Temp:  [98 F (36.7 C)-98.6 F (37 C)] 98 F (36.7 C) (01/08 1109) Pulse Rate:  [87-103] 88 (01/08 1109) Resp:  [18-20] 20 (01/08 1109) BP: (106-119)/(47-60) 106/47 mmHg (01/08 1109) SpO2:  [96 %-100 %] 99 % (01/08 1109) FiO2 (%):  [32 %] 32 % (01/07 1441)  Weight change:  Filed Weights   06/05/15 0329 06/05/15 1001  Weight: 92.534 kg (204 lb) 91.899 kg (202 lb 9.6 oz)    Intake/Output: I/O last 3 completed shifts: In: 480 [P.O.:480] Out: 1800 [Urine:1800]   Intake/Output this shift:  Total I/O In: 240 [P.O.:240] Out: 0   Physical Exam: General: NAD  Head: Normocephalic, atraumatic. Moist oral mucosal membranes  Eyes: Anicteric, PERRL  Neck: Supple, trachea midline  Lungs:  Bilateral crackles  Heart: irregular  Abdomen:  Soft, nontender, +bowel sounds  Extremities:  trace peripheral edema.  Neurologic: Nonfocal, moving all four extremities  Skin: Ulcerated lesions bilateral lower extremities       Basic Metabolic Panel:  Recent Labs Lab 06/05/15 0407 06/06/15 0459 06/07/15 0510 06/08/15 0936 06/09/15 1021  NA 135 132* 133* 131* 131*  K 4.4 5.1 5.1 5.8* 6.4*  CL 100* 99* 99* 97* 100*  CO2 25 24 24 25 24   GLUCOSE 116* 99 105* 103* 112*  BUN 51* 62* 73* 75* 84*  CREATININE 2.59* 3.86* 3.97* 3.68* 3.56*  CALCIUM 10.2 9.6 9.2 9.3 9.4    Liver Function Tests:  Recent Labs Lab 06/05/15 0407 06/06/15 0459  AST 35 33  ALT 17 16  ALKPHOS 105 103  BILITOT 17.2* 15.8*  PROT 6.9 6.5  ALBUMIN 3.2* 3.0*   No results for input(s): LIPASE, AMYLASE in the last 168 hours. No results for input(s): AMMONIA in the last 168 hours.  CBC:  Recent Labs Lab 06/05/15 0407 06/06/15 0459  WBC 11.1* 10.7  HGB 6.1* 7.0*  HCT  18.1* 21.1*  MCV 103.5* 99.2  PLT 269 247    Cardiac Enzymes:  Recent Labs Lab 06/05/15 0407  TROPONINI 0.03    BNP: Invalid input(s): POCBNP  CBG: No results for input(s): GLUCAP in the last 168 hours.  Microbiology: Results for orders placed or performed in visit on 05/02/12  Urine culture     Status: None   Collection Time: 05/02/12  4:31 PM  Result Value Ref Range Status   Micro Text Report   Final       SOURCE: CLEAN CATCH    COMMENT                   NO GROWTH IN 36 HOURS   ANTIBIOTIC                                                        Coagulation Studies: No results for input(s): LABPROT, INR in the last 72 hours.  Urinalysis: No results for input(s): COLORURINE, LABSPEC, PHURINE, GLUCOSEU, HGBUR, BILIRUBINUR, KETONESUR, PROTEINUR, UROBILINOGEN, NITRITE, LEUKOCYTESUR in the last 72 hours.  Invalid input(s): APPERANCEUR    Imaging: Dg Chest 1 View  06/09/2015  CLINICAL DATA:  History of COPD, hypertension,  former smoker. Sickle cell disease. EXAM: CHEST 1 VIEW COMPARISON:  06/05/2015 FINDINGS: Right internal jugular approach central venous catheter is unchanged in position. The cardiac silhouette is stably enlarged. Mediastinal contours appear intact. There are bilateral lower lobe predominant patchy areas of airspace consolidation, on the background of increased interstitial markings. There is no evidence of pneumothorax or pleural effusions. Osseous structures are without acute abnormality. Soft tissues are grossly normal. IMPRESSION: Stably enlarged cardiac silhouette. Bilateral lower lobe predominant patchy areas of airspace consolidation, worse from the prior radiograph. Differential diagnosis includes development of pulmonary edema versus multifocal pneumonia. Underlying pulmonary vascular congestion. Electronically Signed   By: Fidela Salisbury M.D.   On: 06/09/2015 10:52     Medications:     . allopurinol  100 mg Oral Daily  . antiseptic oral  rinse  7 mL Mouth Rinse BID  . calcitRIOL  0.25 mcg Oral Daily  . collagenase  1 application Topical Daily  . cyclobenzaprine  5 mg Oral QHS  . folic acid  1 mg Oral Daily  . hydroxyurea  500 mg Oral Daily  . ipratropium-albuterol  3 mL Nebulization Q6H  . methylPREDNISolone (SOLU-MEDROL) injection  60 mg Intravenous Q24H  . metoprolol tartrate  25 mg Oral 3 times per day  . mometasone-formoterol  2 puff Inhalation BID  . polyethylene glycol  17 g Oral BID  . rosuvastatin  5 mg Oral Daily  . sodium chloride  3 mL Intravenous Q12H     Assessment/ Plan:  Jane Short is a 65 y.o. black female  with sickle cell disease, atrial myxoma who was admitted on 06/05/2015 for Acute pulmonary edema ([place]) [J81.0] Hypoxia [R09.02] Atrial fibrillation with rapid ventricular response ([place]) [I48.91] Elevated bilirubin [R17] Abdominal pain [R10.9]  1. 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 acute renal failure due to severe anemia, hypoxia and prerenal azotemia from poor PO intake and vomiting.  Potassium elevated: kayexalate, calcium gluconate and furosemide ordered Chronic Kidney Disease is secondary to NSAID use and sickle cell anemia.  - Renal function reviewed with patient. Discussed the role of furosemide.  - Renally dose all agents. Avoid nephrotoxic agents.  - not on an ACE-I/ARB as outpatient due to hyperkalemia.   2. 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 Hemoglobin back to her baseline at 7. No recent CBC - transfusion and erythropoietin as per hematology - hydroxyurea as outpatient.  3. Hypertension: blood pressure at goal. With atrial fibrillation and rapid ventricular response on admission.  - metoprolol for rate control.  - appreciate cards input. Echo reviewed with patient    4. Secondary Hyperparathyroidism: PTH 250.  Calcium at goal during admission.  - Continue calcitriol    LOS: [place], [place] 1/8/201712:38 PM

## 2015-06-09 NOTE — Progress Notes (Signed)
Patient requests that we access her port instead of using a peripheral IV. Dr Vianne Bulls and K[place]uru notifed and stated that it was ok to use.

## 2015-06-09 NOTE — Progress Notes (Signed)
Patient has rested quietly tonight. No complaints of pain and no signs of discomfort or distress noted. Patient occasionally got up to sit on the edge of the bed or use the bathroom. Currently on 3L via nasal cannula. Nursing staff will continue to monitor. Earleen Reaper, RN

## 2015-06-09 NOTE — Progress Notes (Signed)
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 Hemoglobin was 6.1 on admission, patient received 1 unit of packed RBC, hemoglobin improved to 7. Main  Problem is still  Sob.hypoxia.noted to have wheezing today.  CHIEF COMPLAINT:   Chief Complaint  Patient presents with  . Emesis    REVIEW OF SYSTEMS:   ROS CONSTITUTIONAL:  Generalized anasarca EYES: No blurred or double vision.  EARS, NOSE, AND THROAT: No tinnitus or ear pain.  RESPIRATORY: Has shortness of breath. CARDIOVASCULAR: No chest pain, orthopnea, has pedal edema.  GASTROINTESTINAL: No nausea, vomiting, diarrhea or abdominal pain.  GENITOURINARY: No dysuria, hematuria.  ENDOCRINE: No polyuria, nocturia,  HEMATOLOGY: No anemia, easy bruising or bleeding SKIN: No rash or lesion. MUSCULOSKELETAL: No joint pain or arthritis.   NEUROLOGIC: No tingling, numbness, weakness.  PSYCHIATRY: No anxiety or depression.   DRUG ALLERGIES:   Allergies  Allergen Reactions  . Ramipril     cough    VITALS:  Blood pressure 119/56, pulse 87, temperature 98.3 F (36.8 C), temperature source Oral, resp. rate 18, height 5\' 7"  (1.702 m), weight 91.899 kg (202 lb 9.6 oz), SpO2 97 %.  PHYSICAL EXAMINATION:  GENERAL:  65 y.o.-year-old patient lying in the bed , generalized anasarca noted.  EYES: Pupils equal, round, reactive to light and accommodation. No scleral icterus. Extraocular muscles intact.  HEENT: Head atraumatic, normocephalic. Oropharynx and nasopharynx clear.  NECK:  Supple, no jugular venous distention. No thyroid enlargement, no tenderness.  LUNGS decreased  breath sounds bilaterally. Noted to have bilateral expiratory wheeze today. CARDIOVASCULAR:irregularly  irregular rhythm EN: Soft, nontender, nondistended. Bowel sounds present. No organomegaly or mass.  EXTREMITIES has pedal edema.  NEUROLOGIC: Cranial nerves II through XII are intact. Muscle strength 5/5 in all extremities. Sensation intact. Gait not checked.  PSYCHIATRIC: The patient is alert and oriented x 3.  SKIN: No obvious rash, lesion, or ulcer.    LABORATORY PANEL:   CBC  Recent Labs Lab 06/06/15 0459  WBC 10.7  HGB 7.0*  HCT 21.1*  PLT 247   ------------------------------------------------------------------------------------------------------------------  Chemistries   Recent Labs Lab 06/06/15 0459  06/08/15 0936  NA 132*  < > 131*  K 5.1  < > 5.8*  CL 99*  < > 97*  CO2 24  < > 25  GLUCOSE 99  < > 103*  BUN 62*  < > 75*  CREATININE 3.86*  < > 3.68*  CALCIUM 9.6  < > 9.3  AST 33  --   --   ALT 16  --   --   ALKPHOS 103  --   --   BILITOT 15.8*  --   --   < > = values in this interval not displayed. ------------------------------------------------------------------------------------------------------------------  Cardiac Enzymes  Recent Labs Lab 06/05/15 0407  TROPONINI 0.03   ------------------------------------------------------------------------------------------------------------------  RADIOLOGY:  No results found.  EKG:   Orders placed or performed during the hospital encounter of 06/05/15  . ED EKG  . ED EKG  . EKG 12-Lead  . 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 Hemoglobin improved, anemia improved. Hemoglobin is around 7  #2 .acute hypoxic respiratory failure secondary to pulmonary edema: IV Lasix was stopped secondary to worsening kidney function. Continue oxygen. Difficult situation because Lasix will help with this  but Lasix is stopped secondary to renal failure.' Needing oxygen.upto 3 litres today. Marland Kitchen History of atrial myxoma. echoCardiogram showed EF 55%. .  #4, Acute  renal failure on CKD  stage IV with hyperkalemia: Patient acute renal failure is due to ATN with a severe anemia ,hypoxia. And prerenal; azotemiawith poor by mouth intake, vomiting: Chronic kidney disease secondary to NSAID use. Avoid nephrotoxic agents. Not on ACE inhibitor  Or ARB secondary to hyperkalemia.  Hold furosemide, monitor kidney function closely. Nephrology is following.renal function s;ightly better. #5 hypertension: Controlled.  6 history of atrial myxoma: Follows up with.[facility] #7 history of sickle cell disease: Continue Hydrea, pain medications. Acute hypoxia: secondary to volume overload and also renal failure: Improving.any way she is hight risk for intubation . All the records are reviewed and case discussed with Care Management/Social Workerr. Management plans discussed with the patient, family and they are in agreement.  CODE STATUS:full  TOTAL TIME TAKING CARE OF THIS PATIENT: 73minutes.   POSSIBLE D/C IN  1-2 DAYS, DEPENDING ON CLINICAL CONDITION.   Epifanio Lesches M.D on 06/09/2015 at 10:04 AM  Between 7am to 6pm - Pager - [phone]  After 6pm go to [email or web address] - password [email or web address] [place] [facility]  Office  [phone]  CC: Primary care physician; [name], MD   Note: This dictation was prepared with Dragon dictation along with smaller phrase technology. Any transcriptional errors that result from this process are unintentional.

## 2015-06-09 NOTE — Progress Notes (Signed)
Dr. Vianne Bulls notified that patients potassium is 6.4. Orders received see MAR

## 2015-06-10 ENCOUNTER — Ambulatory Visit: Payer: BLUE CROSS/BLUE SHIELD | Admitting: Surgery

## 2015-06-10 LAB — BASIC METABOLIC PANEL
ANION GAP: 6 (ref 5–15)
BUN: 71 mg/dL — ABNORMAL HIGH (ref 6–20)
CO2: 20 mmol/L — AB (ref 22–32)
Calcium: 7.2 mg/dL — ABNORMAL LOW (ref 8.9–10.3)
Chloride: 111 mmol/L (ref 101–111)
Creatinine, Ser: 2.06 mg/dL — ABNORMAL HIGH (ref 0.44–1.00)
GFR calc Af Amer: 28 mL/min — ABNORMAL LOW (ref >60)
GFR, EST NON AFRICAN AMERICAN: 24 mL/min — AB (ref >60)
GLUCOSE: 129 mg/dL — AB (ref 65–99)
POTASSIUM: 3.6 mmol/L (ref 3.5–5.1)
Sodium: 137 mmol/L (ref 135–145)

## 2015-06-10 LAB — GLUCOSE, CAPILLARY: Glucose-Capillary: 175 mg/dL — ABNORMAL HIGH (ref 65–99)

## 2015-06-10 NOTE — Care Management (Signed)
Continue to anticipate need for home 02 but patient will have to be re qualified.    Lasix remains on hold.  Need to consider home health nursing with tele heath monitoring capabilities.  Discussed  need for qualifying sats and continue mobilization of patient .  She has not ambulated outside her room

## 2015-06-10 NOTE — Progress Notes (Signed)
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 Hemoglobin was 6.1 on admission, patient received 1 unit of packed RBC, hemoglobin improved to 7.  she feels better.no SOb.  CHIEF COMPLAINT:   Chief Complaint  Patient presents with  . Emesis    REVIEW OF SYSTEMS:   ROS CONSTITUTIONAL:  Generalized anasarca EYES: No blurred or double vision.  EARS, NOSE, AND THROAT: No tinnitus or ear pain.  RESPIRATORY: Has shortness of breath. CARDIOVASCULAR: No chest pain, orthopnea, has pedal edema.  GASTROINTESTINAL: No nausea, vomiting, diarrhea or abdominal pain.  GENITOURINARY: No dysuria, hematuria.  ENDOCRINE: No polyuria, nocturia,  HEMATOLOGY: No anemia, easy bruising or bleeding SKIN: No rash or lesion. MUSCULOSKELETAL: No joint pain or arthritis.   NEUROLOGIC: No tingling, numbness, weakness.  PSYCHIATRY: No anxiety or depression.   DRUG ALLERGIES:   Allergies  Allergen Reactions  . Ramipril     cough    VITALS:  Blood pressure 120/64, pulse 95, temperature 97.5 F (36.4 C), temperature source Oral, resp. rate 16, height 5\' 7"  (1.702 m), weight 91.899 kg (202 lb 9.6 oz), SpO2 92 %.  PHYSICAL EXAMINATION:  GENERAL:  64 y.o.-year-old patient lying in the bed , generalized anasarca noted.  EYES: Pupils equal, round, reactive to light and accommodation. No scleral icterus. Extraocular muscles intact.  HEENT: Head atraumatic, normocephalic. Oropharynx and nasopharynx clear.  NECK:  Supple, no jugular venous distention. No thyroid enlargement, no tenderness.  LUNGS decreased  breath sounds bilaterally. Noted to have bilateral expiratory wheeze today. CARDIOVASCULAR:irregularly irregular rhythm EN: Soft, nontender,  nondistended. Bowel sounds present. No organomegaly or mass.  EXTREMITIES has pedal edema.  NEUROLOGIC: Cranial nerves II through XII are intact. Muscle strength 5/5 in all extremities. Sensation intact. Gait not checked.  PSYCHIATRIC: The patient is alert and oriented x 3.  SKIN: No obvious rash, lesion, or ulcer.    LABORATORY PANEL:   CBC  Recent Labs Lab 06/06/15 0459  WBC 10.7  HGB 7.0*  HCT 21.1*  PLT 247   ------------------------------------------------------------------------------------------------------------------  Chemistries   Recent Labs Lab 06/06/15 0459  06/09/15 1517  NA 132*  < > 134*  K 5.1  < > 5.9*  CL 99*  < > 99*  CO2 24  < > 25  GLUCOSE 99  < > 142*  BUN 62*  < > 83*  CREATININE 3.86*  < > 3.26*  CALCIUM 9.6  < > 9.5  AST 33  --   --   ALT 16  --   --   ALKPHOS 103  --   --   BILITOT 15.8*  --   --   < > = values in this interval not displayed. ------------------------------------------------------------------------------------------------------------------  Cardiac Enzymes  Recent Labs Lab 06/05/15 0407  TROPONINI 0.03   ------------------------------------------------------------------------------------------------------------------  RADIOLOGY:  Dg Chest 1 View  06/09/2015  CLINICAL DATA:  History of COPD, hypertension, former smoker. Sickle cell disease. EXAM: CHEST 1 VIEW COMPARISON:  06/05/2015 FINDINGS: Right internal jugular approach central venous catheter is unchanged in position. The cardiac silhouette is stably enlarged. Mediastinal contours appear intact. There are bilateral lower lobe predominant patchy areas of airspace consolidation, on the background of increased interstitial markings. There is no evidence of pneumothorax or pleural effusions.  Osseous structures are without acute abnormality. Soft tissues are grossly normal. IMPRESSION: Stably enlarged cardiac silhouette. Bilateral lower lobe predominant patchy areas of  airspace consolidation, worse from the prior radiograph. Differential diagnosis includes development of pulmonary edema versus multifocal pneumonia. Underlying pulmonary vascular congestion. Electronically Signed   By: Fidela Salisbury M.D.   On: 06/09/2015 10:52    EKG:   Orders placed or performed during the hospital encounter of 06/05/15  . ED EKG  . ED EKG  . EKG 12-Lead  . 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 Hemoglobin improved, anemia improved. Hemoglobin is around 7  #2 .acute hypoxic respiratory failure secondary to pulmonary edema: IV Lasix was stopped secondary to worsening kidney function. Continue oxygen. Difficult situation because Lasix will help with this but Lasix is stopped secondary to renal failure.' Needing oxygen.upto 3 litres today. Marland Kitchen History of atrial myxoma. echoCardiogram showed EF 55%. .  #4, Acute  renal failure on CKD  stage IV with hyperkalemia: Patient acute renal failure is due to ATN with a severe anemia ,hypoxia. And prerenal; azotemiawith poor by mouth intake, vomiting: Chronic kidney disease secondary to NSAID use. Avoid nephrotoxic agents. Not on ACE inhibitor  Or ARB secondary to hyperkalemia.  Hold furosemide, monitor kidney function closely. Nephrology is following.renal function s;ightly better. #5 hypertension: Controlled.  6 history of atrial myxoma: Follows up with[facility] #7 history of sickle cell disease: Continue Hydrea, pain medications. Acute hypoxia: secondary to volume overload and also renal failure: Improving.any way she is hight risk for intubation ,chest x ray showed chf/pneumonia; lungs are better than yesterday. Patient is not toxic. O2 sats 98% on 2 L. All the records are reviewed and case discussed with Care  Management/Social Workerr. Management plans discussed with the patient, family and they are in agreement.  CODE STATUS:full  TOTAL TIME TAKING CARE OF THIS PATIENT: 45minutes.   POSSIBLE D/C IN  1-2 DAYS, DEPENDING ON CLINICAL CONDITION.   Epifanio Lesches M.D on 06/10/2015 at 2:52 PM  Between 7am to 6pm - Pager - [phone]  After 6pm go to [email or web address] - password [email or web address] [place] [facility]  Office  [phone]  CC: Primary care physician; [name], MD   Note: This dictation was prepared with Dragon dictation along with smaller phrase technology. Any transcriptional errors that result from this process are unintentional.

## 2015-06-10 NOTE — Progress Notes (Signed)
[facility]  ROUNDING NOTE   Subjective:   Patient states that she feels better overall Serum creatinine improved to 2.06 No further chest pain or shortness of breath  Objective:  Vital signs in last 24 hours:  Temp:  [97.5 F (36.4 C)-98.2 F (36.8 C)] 97.5 F (36.4 C) (01/09 1152) Pulse Rate:  [85-95] 95 (01/09 1152) Resp:  [16-20] 16 (01/09 1152) BP: (95-123)/(56-64) 120/64 mmHg (01/09 1152) SpO2:  [92 %-99 %] 92 % (01/09 1152)  Weight change:  Filed Weights   06/05/15 0329 06/05/15 1001  Weight: 92.534 kg (204 lb) 91.899 kg (202 lb 9.6 oz)    Intake/Output: I/O last 3 completed shifts: In: 1080 [P.O.:1080] Out: 1250 [Urine:1250]   Intake/Output this shift:  Total I/O In: 480 [P.O.:480] Out: -   Physical Exam: General: NAD  Head: Normocephalic, atraumatic. Moist oral mucosal membranes  Eyes: Anicteric,   Neck: Supple, trachea midline  Lungs:   clear bilaterally   Heart: irregular  Abdomen:  Soft, nontender, +bowel sounds  Extremities:  trace peripheral edema.  Neurologic: Nonfocal, moving all four extremities  Skin: Ulcerated lesions bilateral lower extremities       Basic Metabolic Panel:  Recent Labs Lab 06/07/15 0510 06/08/15 0936 06/09/15 1021 06/09/15 1517 06/10/15 1315  NA 133* 131* 131* 134* 137  K 5.1 5.8* 6.4* 5.9* 3.6  CL 99* 97* 100* 99* 111  CO2 24 25 24 25  20*  GLUCOSE 105* 103* 112* 142* 129*  BUN 73* 75* 84* 83* 71*  CREATININE 3.97* 3.68* 3.56* 3.26* 2.06*  CALCIUM 9.2 9.3 9.4 9.5 7.2*    Liver Function Tests:  Recent Labs Lab 06/05/15 0407 06/06/15 0459  AST 35 33  ALT 17 16  ALKPHOS 105 103  BILITOT 17.2* 15.8*  PROT 6.9 6.5  ALBUMIN 3.2* 3.0*   No results for input(s): LIPASE, AMYLASE in the last 168 hours. No results for input(s): AMMONIA in the last 168 hours.  CBC:  Recent Labs Lab 06/05/15 0407 06/06/15 0459  WBC 11.1* 10.7  HGB 6.1* 7.0*  HCT 18.1* 21.1*  MCV 103.5* 99.2  PLT 269  247    Cardiac Enzymes:  Recent Labs Lab 06/05/15 0407  TROPONINI 0.03    BNP: Invalid input(s): POCBNP  CBG:  Recent Labs Lab 06/10/15 1651  GLUCAP 175*    Microbiology: Results for orders placed or performed in visit on 05/02/12  Urine culture     Status: None   Collection Time: 05/02/12  4:31 PM  Result Value Ref Range Status   Micro Text Report   Final       SOURCE: CLEAN CATCH    COMMENT                   NO GROWTH IN 36 HOURS   ANTIBIOTIC                                                        Coagulation Studies: No results for input(s): LABPROT, INR in the last 72 hours.  Urinalysis: No results for input(s): COLORURINE, LABSPEC, PHURINE, GLUCOSEU, HGBUR, BILIRUBINUR, KETONESUR, PROTEINUR, UROBILINOGEN, NITRITE, LEUKOCYTESUR in the last 72 hours.  Invalid input(s): APPERANCEUR    Imaging: Dg Chest 1 View  06/09/2015  CLINICAL DATA:  History of COPD, hypertension, former smoker. Sickle cell disease.  EXAM: CHEST 1 VIEW COMPARISON:  06/05/2015 FINDINGS: Right internal jugular approach central venous catheter is unchanged in position. The cardiac silhouette is stably enlarged. Mediastinal contours appear intact. There are bilateral lower lobe predominant patchy areas of airspace consolidation, on the background of increased interstitial markings. There is no evidence of pneumothorax or pleural effusions. Osseous structures are without acute abnormality. Soft tissues are grossly normal. IMPRESSION: Stably enlarged cardiac silhouette. Bilateral lower lobe predominant patchy areas of airspace consolidation, worse from the prior radiograph. Differential diagnosis includes development of pulmonary edema versus multifocal pneumonia. Underlying pulmonary vascular congestion. Electronically Signed   By: Fidela Salisbury M.D.   On: 06/09/2015 10:52     Medications:     . allopurinol  100 mg Oral Daily  . antiseptic oral rinse  7 mL Mouth Rinse BID  . calcitRIOL   0.25 mcg Oral Daily  . collagenase  1 application Topical Daily  . cyclobenzaprine  5 mg Oral QHS  . folic acid  1 mg Oral Daily  . hydroxyurea  500 mg Oral Daily  . ipratropium-albuterol  3 mL Nebulization Q6H  . methylPREDNISolone (SOLU-MEDROL) injection  60 mg Intravenous Q24H  . metoprolol tartrate  25 mg Oral 3 times per day  . mometasone-formoterol  2 puff Inhalation BID  . polyethylene glycol  17 g Oral BID  . rosuvastatin  5 mg Oral Daily  . sodium chloride  10 mL Intracatheter Q12H  . sodium chloride  3 mL Intravenous Q12H     Assessment/ Plan:  [name] is a 65 y.o. black female  with sickle cell disease, atrial myxoma who was admitted on 06/05/2015 for Acute pulmonary edema ([place]) [J81.0] Hypoxia [R09.02] Atrial fibrillation with rapid ventricular response ([place]) [I48.91] Elevated bilirubin [R17] Abdominal pain [R10.9]  1. 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 acute renal failure due to severe anemia, hypoxia and prerenal azotemia from poor PO intake and vomiting.  Potassium elevated: kayexalate, calcium gluconate and furosemide ordered Chronic Kidney Disease is secondary to NSAID use and sickle cell anemia.  -  serum creatinine continues to improve  - Renally dose all agents. Avoid nephrotoxic agents.  - not on an ACE-I/ARB as outpatient due to hyperkalemia.   2. 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 Hemoglobin back to her baseline at 7. - transfusion and erythropoietin as per hematology - hydroxyurea as outpatient.  3. Hypertension: blood pressure at goal. With atrial fibrillation and rapid ventricular response on admission.  - metoprolol for rate control.     4. Secondary Hyperparathyroidism: PTH 250. Calcium at goal during admission.  - Continue calcitriol    LOS: 5 Jane Short 1/9/20176:09  PM

## 2015-06-10 NOTE — Progress Notes (Signed)
Left leg dressing changed per MD order, pt tolerated well, will continue to assess

## 2015-06-11 ENCOUNTER — Inpatient Hospital Stay: Payer: BLUE CROSS/BLUE SHIELD

## 2015-06-11 MED ORDER — SODIUM CHLORIDE 0.9 % IV SOLN
INTRAVENOUS | Status: DC
  Administered 2015-06-11: 22:00:00 via INTRAVENOUS

## 2015-06-11 MED ORDER — SPIRONOLACTONE 25 MG PO TABS
12.5000 mg | ORAL_TABLET | Freq: Every day | ORAL | Status: DC
  Administered 2015-06-11 – 2015-06-13 (×3): 12.5 mg via ORAL
  Filled 2015-06-11 (×3): qty 1

## 2015-06-11 MED ORDER — FUROSEMIDE 10 MG/ML IJ SOLN
4.0000 mg/h | INTRAVENOUS | Status: DC
  Administered 2015-06-11 – 2015-06-13 (×3): 4 mg/h via INTRAVENOUS
  Filled 2015-06-11 (×3): qty 25

## 2015-06-11 MED ORDER — PREDNISONE 50 MG PO TABS
50.0000 mg | ORAL_TABLET | Freq: Every day | ORAL | Status: DC
  Administered 2015-06-12 – 2015-06-19 (×8): 50 mg via ORAL
  Filled 2015-06-11 (×8): qty 1

## 2015-06-11 NOTE — Progress Notes (Signed)
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 Hemoglobin was 6.1 on admission, patient received 1 unit of packed RBC, hemoglobin improved to 7. he continues to have abdominal swelling, fluid in last abdomen secondary to renal failure not getting Lasix. Requesting paracentesis. Saturations 91% on room air.   CHIEF COMPLAINT:   Chief Complaint  Patient presents with  . Emesis    REVIEW OF SYSTEMS:   ROS CONSTITUTIONAL:  Generalized anasarca EYES: No blurred or double vision.  EARS, NOSE, AND THROAT: No tinnitus or ear pain.  RESPIRATORY: Has shortness of breath. CARDIOVASCULAR: No chest pain, orthopnea, has pedal edema.  GASTROINTESTINAL: No nausea, vomiting, diarrhea or abdominal pain. Has a lot of abdominal swelling. GENITOURINARY: No dysuria, hematuria.  ENDOCRINE: No polyuria, nocturia,  HEMATOLOGY: No anemia, easy bruising or bleeding SKIN: No rash or lesion. MUSCULOSKELETAL: No joint pain or arthritis.   NEUROLOGIC: No tingling, numbness, weakness.  PSYCHIATRY: No anxiety or depression.   DRUG ALLERGIES:   Allergies  Allergen Reactions  . Ramipril     cough    VITALS:  Blood pressure 124/76, pulse 87, temperature 97.4 F (36.3 C), temperature source Oral, resp. rate 16, height 5\' 7"  (1.702 m), weight 91.899 kg (202 lb 9.6 oz), SpO2 91 %.  PHYSICAL EXAMINATION:  GENERAL:  65 y.o.-year-old patient lying in the bed , generalized anasarca noted.  EYES: Pupils equal, round, reactive to light and accommodation. No scleral icterus. Extraocular muscles intact.  HEENT: Head atraumatic, normocephalic. Oropharynx and nasopharynx clear.  NECK:  Supple, no jugular venous distention. No thyroid enlargement, no  tenderness.  LUNGS decreased  breath sounds bilaterally. Noted to have bilateral expiratory wheeze today. CARDIOVASCULAR:irregularly irregular rhythm Abdomen;: Soft, nontender, nondistended. Bowel sounds present. No organomegaly or mass. Increased abdominal girth. EXTREMITIES has pedal edema.  NEUROLOGIC: Cranial nerves II through XII are intact. Muscle strength 5/5 in all extremities. Sensation intact. Gait not checked.  PSYCHIATRIC: The patient is alert and oriented x 3.  SKIN: No obvious rash, lesion, or ulcer.    LABORATORY PANEL:   CBC  Recent Labs Lab 06/06/15 0459  WBC 10.7  HGB 7.0*  HCT 21.1*  PLT 247   ------------------------------------------------------------------------------------------------------------------  Chemistries   Recent Labs Lab 06/06/15 0459  06/10/15 1315  NA 132*  < > 137  K 5.1  < > 3.6  CL 99*  < > 111  CO2 24  < > 20*  GLUCOSE 99  < > 129*  BUN 62*  < > 71*  CREATININE 3.86*  < > 2.06*  CALCIUM 9.6  < > 7.2*  AST 33  --   --   ALT 16  --   --   ALKPHOS 103  --   --   BILITOT 15.8*  --   --   < > = values in this interval not displayed. ------------------------------------------------------------------------------------------------------------------  Cardiac Enzymes  Recent Labs Lab 06/05/15 0407  TROPONINI 0.03   ------------------------------------------------------------------------------------------------------------------  RADIOLOGY:  US Abdomen Limited  06/11/2015  CLINICAL DATA:  Abdominal distention and discomfort. Assess for ascites EXAM: LIMITED ABDOMEN ULTRASOUND FOR ASCITES TECHNIQUE: Limited ultrasound survey for ascites was performed in all four abdominal quadrants. COMPARISON:  06/05/2015 FINDINGS: Small amount of abdominal ascites in the right upper  quadrant about the liver and in the left lower quadrant. No significant large volume of ascites demonstrated to warrant paracentesis. Nodular hepatic surface  compatible with cirrhosis. IMPRESSION: Small amount of abdominal ascites. Not enough to warrant therapeutic paracentesis. Probable hepatic cirrhosis Electronically Signed   By: Jerilynn Mages.  Shick M.D.   On: 06/11/2015 11:04    EKG:   Orders placed or performed during the hospital encounter of 06/05/15  . ED EKG  . ED EKG  . EKG 12-Lead  . 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 Hemoglobin improved, anemia improved. Hemoglobin is around 7   #2 .acute hypoxic respiratory failure secondary to pulmonary edema: IV Lasix was stopped secondary to worsening kidney function. Continue oxygen. Difficult situation because Lasix will help with this but Lasix is stopped secondary to renal failure.'   History of atrial myxoma. echoCardiogram showed EF 55%.  .  #4, Acute  renal failure on CKD  stage IV with hyperkalemia: Patient acute renal failure is due to ATN with a severe anemia ,hypoxia. And prerenal; azotemiawith poor by mouth intake'improving renal function, creatinine is at 2.06 yesterday. Nausea, vomiting resolved.  ,5.Chronic kidney disease secondary to NSAID use. Avoid nephrotoxic agents. Not on ACE inhibitor  Or ARB secondary to hyperkalemia.  Hold furosemide, monitor kidney function closely. Nephrology is following.  #6. hypertension: Controlled   7.history of atrial myxoma: Follows up with.[facility]  #8. history of sickle cell disease: Continue Hydrea, pain medications. Has cirrhosis of the liver and ultrasound of abdomen.  Minimal Ascites for any paracentesis by ultrasound of abdomen.  9.Acute hypoxia: secondary to volume overload and also renal failure: Improving Reactive airways; started on nebulizers and steroids because of wheezing patient feels better now we will discontinue IV  steroids and changed to prednisone.  All the records are reviewed and case discussed with Care Management/Social Workerr. Management plans discussed with the patient, family and they are in agreement.  CODE STATUS:full  TOTAL TIME TAKING CARE OF THIS PATIENT: 71minutes.   POSSIBLE D/C IN  1-2 DAYS, DEPENDING ON CLINICAL CONDITION.   Epifanio Lesches M.D on 06/11/2015 at 11:39 AM  Between 7am to 6pm - Pager - [phone]  After 6pm go to [email or web address] - password [email or web address] [facility]  Office  [phone]  CC: Primary care physician; [name], MD   Note: This dictation was prepared with Dragon dictation along with smaller phrase technology. Any transcriptional errors that result from this process are unintentional.

## 2015-06-11 NOTE — Progress Notes (Signed)
Patient had a restless night and was unable to get much sleep until earlier this morning. One complaint of pain treated with PRN vicodin and no signs of discomfort or distress noted. Nursing staff will continue to monitor. Earleen Reaper, RN

## 2015-06-11 NOTE — Progress Notes (Signed)
[facility]  ROUNDING NOTE   Subjective:   Patient states that she feels better overall Serum creatinine improved to 2.06 yesterday.  No new labs today No further chest pain or shortness of breath Patient reports that her abdomen is distended She denies any constipation Patient continues to have complex medical problems  Objective:  Vital signs in last 24 hours:  Temp:  [97.2 F (36.2 C)-97.8 F (36.6 C)] 97.8 F (36.6 C) (01/10 1143) Pulse Rate:  [61-144] 61 (01/10 1353) Resp:  [16-20] 18 (01/10 1143) BP: (123-141)/(72-76) 123/72 mmHg (01/10 1143) SpO2:  [83 %-99 %] 99 % (01/10 1353)  Weight change:  Filed Weights   06/05/15 0329 06/05/15 1001  Weight: 92.534 kg (204 lb) 91.899 kg (202 lb 9.6 oz)    Intake/Output: I/O last 3 completed shifts: In: 68 [P.O.:720] Out: -    Intake/Output this shift:  Total I/O In: 720 [P.O.:720] Out: -   Physical Exam: General: NAD  Head: Normocephalic, atraumatic. Moist oral mucosal membranes  Eyes: Anicteric,   Neck: Supple, trachea midline  Lungs:   clear bilaterally   Heart: irregular  Abdomen:  Soft, nontender, +bowel sounds, distended  Extremities:  +peripheral edema.  Neurologic: Nonfocal, moving all four extremities  Skin: Ulcerated lesions bilateral lower extremities       Basic Metabolic Panel:  Recent Labs Lab 06/07/15 0510 06/08/15 0936 06/09/15 1021 06/09/15 1517 06/10/15 1315  NA 133* 131* 131* 134* 137  K 5.1 5.8* 6.4* 5.9* 3.6  CL 99* 97* 100* 99* 111  CO2 24 25 24 25  20*  GLUCOSE 105* 103* 112* 142* 129*  BUN 73* 75* 84* 83* 71*  CREATININE 3.97* 3.68* 3.56* 3.26* 2.06*  CALCIUM 9.2 9.3 9.4 9.5 7.2*    Liver Function Tests:  Recent Labs Lab 06/05/15 0407 06/06/15 0459  AST 35 33  ALT 17 16  ALKPHOS 105 103  BILITOT 17.2* 15.8*  PROT 6.9 6.5  ALBUMIN 3.2* 3.0*   No results for input(s): LIPASE, AMYLASE in the last 168 hours. No results for input(s): AMMONIA in the last  168 hours.  CBC:  Recent Labs Lab 06/05/15 0407 06/06/15 0459  WBC 11.1* 10.7  HGB 6.1* 7.0*  HCT 18.1* 21.1*  MCV 103.5* 99.2  PLT 269 247    Cardiac Enzymes:  Recent Labs Lab 06/05/15 0407  TROPONINI 0.03    BNP: Invalid input(s): POCBNP  CBG:  Recent Labs Lab 06/10/15 1651  GLUCAP 175*    Microbiology: Results for orders placed or performed in visit on 05/02/12  Urine culture     Status: None   Collection Time: 05/02/12  4:31 PM  Result Value Ref Range Status   Micro Text Report   Final       SOURCE: CLEAN CATCH    COMMENT                   NO GROWTH IN 36 HOURS   ANTIBIOTIC                                                        Coagulation Studies: No results for input(s): LABPROT, INR in the last 72 hours.  Urinalysis: No results for input(s): COLORURINE, LABSPEC, PHURINE, GLUCOSEU, HGBUR, BILIRUBINUR, KETONESUR, PROTEINUR, UROBILINOGEN, NITRITE, LEUKOCYTESUR in the last 72 hours.  Invalid input(s):  APPERANCEUR    Imaging: US Abdomen Limited  06/11/2015  CLINICAL DATA:  Abdominal distention and discomfort. Assess for ascites EXAM: LIMITED ABDOMEN ULTRASOUND FOR ASCITES TECHNIQUE: Limited ultrasound survey for ascites was performed in all four abdominal quadrants. COMPARISON:  06/05/2015 FINDINGS: Small amount of abdominal ascites in the right upper quadrant about the liver and in the left lower quadrant. No significant large volume of ascites demonstrated to warrant paracentesis. Nodular hepatic surface compatible with cirrhosis. IMPRESSION: Small amount of abdominal ascites. Not enough to warrant therapeutic paracentesis. Probable hepatic cirrhosis Electronically Signed   By: Jerilynn Mages.  Shick M.D.   On: 06/11/2015 11:04     Medications:   . furosemide (LASIX) infusion 4 mg/hr (06/11/15 1420)   . allopurinol  100 mg Oral Daily  . antiseptic oral rinse  7 mL Mouth Rinse BID  . calcitRIOL  0.25 mcg Oral Daily  . collagenase  1 application Topical  Daily  . cyclobenzaprine  5 mg Oral QHS  . folic acid  1 mg Oral Daily  . hydroxyurea  500 mg Oral Daily  . ipratropium-albuterol  3 mL Nebulization Q6H  . metoprolol tartrate  25 mg Oral 3 times per day  . mometasone-formoterol  2 puff Inhalation BID  . polyethylene glycol  17 g Oral BID  . [START ON 06/12/2015] predniSONE  50 mg Oral Q breakfast  . rosuvastatin  5 mg Oral Daily  . sodium chloride  10 mL Intracatheter Q12H  . sodium chloride  3 mL Intravenous Q12H  . spironolactone  12.5 mg Oral Daily     Assessment/ Plan:  [name] is a 65 y.o. black female  with sickle cell disease, atrial myxoma who was admitted on 06/05/2015 for Acute pulmonary edema ([place]) [J81.0] Hypoxia [R09.02] Atrial fibrillation with rapid ventricular response ([place]) [I48.91] Elevated bilirubin [R17] Abdominal pain [R10.9]  1. 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 acute renal failure due to severe anemia, hypoxia and prerenal azotemia from poor PO intake and vomiting.  Potassium elevated: kayexalate, calcium gluconate and furosemide ordered Chronic Kidney Disease is secondary to NSAID use and sickle cell anemia.  -  serum creatinine continues to improve  - Renally dose all agents. Avoid nephrotoxic agents.  - not on an ACE-I/ARB as outpatient due to hyperkalemia.   2. 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 Hemoglobin back to her baseline at 7. - transfusion and erythropoietin as per hematology - hydroxyurea as outpatient.  3. Hypertension: blood pressure at goal. With atrial fibrillation and rapid ventricular response on admission.  - metoprolol for rate control.     4. Secondary Hyperparathyroidism: PTH 250. Calcium at goal during admission.  - Continue calcitriol   5.  ascites and anasarca - start low-dose Lasix infusion and low-dose  spironolactone - 2-D echo shows EF 50-55%, moderate mitral regurgitation, severe tricuspid regurgitation   LOS: 6 Jane Short 1/10/20174:55 PM

## 2015-06-11 NOTE — Progress Notes (Signed)
SATURATION QUALIFICATIONS: (This note is used to comply with regulatory documentation for home oxygen)  Patient Saturations on Room Air at Rest = 84%  Patient Saturations on Room Air while Ambulating = 83%  Patient Saturations on 2 Liters of oxygen while Ambulating = 99%  Please briefly explain why patient needs home oxygen:

## 2015-06-12 ENCOUNTER — Inpatient Hospital Stay: Payer: BLUE CROSS/BLUE SHIELD

## 2015-06-12 ENCOUNTER — Ambulatory Visit: Payer: BLUE CROSS/BLUE SHIELD | Admitting: Internal Medicine

## 2015-06-12 LAB — BASIC METABOLIC PANEL
Anion gap: 13 (ref 5–15)
BUN: 110 mg/dL — ABNORMAL HIGH (ref 6–20)
CHLORIDE: 99 mmol/L — AB (ref 101–111)
CO2: 23 mmol/L (ref 22–32)
CREATININE: 3.07 mg/dL — AB (ref 0.44–1.00)
Calcium: 9.9 mg/dL (ref 8.9–10.3)
GFR calc non Af Amer: 15 mL/min — ABNORMAL LOW (ref >60)
GFR, EST AFRICAN AMERICAN: 17 mL/min — AB (ref >60)
Glucose, Bld: 112 mg/dL — ABNORMAL HIGH (ref 65–99)
Potassium: 4.9 mmol/L (ref 3.5–5.1)
Sodium: 135 mmol/L (ref 135–145)

## 2015-06-12 MED ORDER — SODIUM CHLORIDE 0.9 % IV SOLN: Freq: Once | INTRAVENOUS | Status: DC

## 2015-06-12 MED ORDER — HEPARIN SOD (PORK) LOCK FLUSH 100 UNIT/ML IV SOLN
500.0000 [IU] | Freq: Once | INTRAVENOUS | Status: AC
Start: 2015-06-12 — End: 2015-06-13
  Administered 2015-06-13: 500 [IU] via INTRAVENOUS
  Filled 2015-06-12: qty 5

## 2015-06-12 NOTE — Progress Notes (Signed)
Patient ID: Jane Short, female   DOB: 11-Sep-1950, 65 y.o.   MRN: [id number] [facility] PROGRESS NOTE  PCP: [name], MD  HPI/Subjective: Patient still with abdominal swelling. Feels better with regards to her breathing. Offers no complaints.  Objective: Filed Vitals:   06/12/15 0519 06/12/15 1109  BP: 106/68 133/96  Pulse: 93 93  Temp: 98.3 F (36.8 C) 98.1 F (36.7 C)  Resp: 16 20    Intake/Output Summary (Last 24 hours) at 06/12/15 1713 Last data filed at 06/12/15 1410  Gross per 24 hour  Intake 1097.5 ml  Output   1050 ml  Net   47.5 ml   Filed Weights   06/05/15 0329 06/05/15 1001  Weight: 92.534 kg (204 lb) 91.899 kg (202 lb 9.6 oz)    ROS: Review of Systems  Constitutional: Negative for fever and chills.  Eyes: Negative for blurred vision.  Respiratory: Positive for shortness of breath. Negative for cough.   Cardiovascular: Negative for chest pain.  Gastrointestinal: Positive for abdominal pain. Negative for nausea, vomiting, diarrhea and constipation.  Genitourinary: Negative for dysuria.  Musculoskeletal: Negative for joint pain.  Neurological: Negative for dizziness and headaches.   Exam: Physical Exam  Constitutional: She is oriented to person, place, and time.  HENT:  Nose: No mucosal edema.  Mouth/Throat: No oropharyngeal exudate or posterior oropharyngeal edema.  Eyes: Conjunctivae, EOM and lids are normal. Pupils are equal, round, and reactive to light.  Neck: No JVD present. Carotid bruit is not present. No edema present. No thyroid mass and no thyromegaly present.  Cardiovascular: S1 normal and S2 normal.  Exam reveals no gallop.   Murmur heard.  Systolic murmur is present with a grade of 2/6  Pulses:      Dorsalis pedis pulses are 2+ on the right side, and 2+ on the left side.  Respiratory: No respiratory distress. She has decreased breath sounds in the right lower field and the left lower field. She has no  wheezes. She has no rhonchi. She has no rales.  GI: Soft. Bowel sounds are normal. There is no tenderness.  Musculoskeletal:       Right ankle: She exhibits no swelling.       Left ankle: She exhibits no swelling.  Lymphadenopathy:    She has no cervical adenopathy.  Neurological: She is alert and oriented to person, place, and time. No cranial nerve deficit.  Skin: Skin is warm. No rash noted. Nails show no clubbing.  Psychiatric: She has a normal mood and affect.     Data Reviewed: Basic Metabolic Panel:  Recent Labs Lab 06/08/15 0936 06/09/15 1021 06/09/15 1517 06/10/15 1315 06/12/15 1136  NA 131* 131* 134* 137 135  K 5.8* 6.4* 5.9* 3.6 4.9  CL 97* 100* 99* 111 99*  CO2 25 24 25  20* 23  GLUCOSE 103* 112* 142* 129* 112*  BUN 75* 84* 83* 71* 110*  CREATININE 3.68* 3.56* 3.26* 2.06* 3.07*  CALCIUM 9.3 9.4 9.5 7.2* 9.9   Liver Function Tests:  Recent Labs Lab 06/06/15 0459  AST 33  ALT 16  ALKPHOS 103  BILITOT 15.8*  PROT 6.5  ALBUMIN 3.0*   CBC:  Recent Labs Lab 06/06/15 0459  WBC 10.7  HGB 7.0*  HCT 21.1*  MCV 99.2  PLT 247   BNP (last 3 results)  Recent Labs  06/05/15 0407  BNP 683.0*    CBG:  Recent Labs Lab 06/10/15 1651  GLUCAP 175*     Studies:  US Abdomen Limited  06/11/2015  CLINICAL DATA:  Abdominal distention and discomfort. Assess for ascites EXAM: LIMITED ABDOMEN ULTRASOUND FOR ASCITES TECHNIQUE: Limited ultrasound survey for ascites was performed in all four abdominal quadrants. COMPARISON:  06/05/2015 FINDINGS: Small amount of abdominal ascites in the right upper quadrant about the liver and in the left lower quadrant. No significant large volume of ascites demonstrated to warrant paracentesis. Nodular hepatic surface compatible with cirrhosis. IMPRESSION: Small amount of abdominal ascites. Not enough to warrant therapeutic paracentesis. Probable hepatic cirrhosis Electronically Signed   By: Jerilynn Mages.  Shick M.D.   On: 06/11/2015 11:04     Scheduled Meds: . allopurinol  100 mg Oral Daily  . antiseptic oral rinse  7 mL Mouth Rinse BID  . calcitRIOL  0.25 mcg Oral Daily  . collagenase  1 application Topical Daily  . cyclobenzaprine  5 mg Oral QHS  . folic acid  1 mg Oral Daily  . heparin lock flush  500 Units Intravenous Once  . hydroxyurea  500 mg Oral Daily  . ipratropium-albuterol  3 mL Nebulization Q6H  . metoprolol tartrate  25 mg Oral 3 times per day  . mometasone-formoterol  2 puff Inhalation BID  . polyethylene glycol  17 g Oral BID  . predniSONE  50 mg Oral Q breakfast  . rosuvastatin  5 mg Oral Daily  . sodium chloride  10 mL Intracatheter Q12H  . sodium chloride  3 mL Intravenous Q12H  . spironolactone  12.5 mg Oral Daily   Continuous Infusions: . sodium chloride 10 mL/hr at 06/11/15 2203  . furosemide (LASIX) infusion 4 mg/hr (06/11/15 1900)    Assessment/Plan:  1. Abdominal distention and swelling- patient is currently on Lasix drip 2. Acute renal failure on chronic kidney disease with hyperkalemia- watch creatinine closely with the Lasix drip. Creatinine starting to increase again 3. Sickle cell disease status post 1 unit of transfusion with significant anemia. On hydroxyurea 4. Elevated bilirubin, ultrasound of the abdomen consistent with cirrhosis. I will send off hepatitis B and C profiles, hemochromatosis profile, ANA and AMA and get a GI consultation. Not enough fluid for paracentesis. 5. Hyperlipidemia on Crestor 6. Essential hypertension- blood pressure on the lower side 7. History of COPD 8. Atrial fibrillation with rapid ventricular response on admission 9. Echocardiogram shows EF 50-55% with moderate mitral regurgitation and severe tricuspid regurgitation  Code Status:     Code Status Orders        Start     Ordered   06/05/15 1106  Full code   Continuous     06/05/15 1105    Code Status History    Date Active Date Inactive Code Status Order ID Comments User Context   This  patient has a current code status but no historical code status.     Disposition Plan: To be determined  Time spent: 25 minutes  Loletha Grayer  [facility]

## 2015-06-12 NOTE — Progress Notes (Signed)
[facility]  ROUNDING NOTE   Subjective:   Patient states that she feels better overall Serum creatinine has again worsened to 3.07 No further chest pain or shortness of breath She was placed on Lasix infusion yesterday She does report increased frequency of voiding  Objective:  Vital signs in last 24 hours:  Temp:  [97.5 F (36.4 C)-98.3 F (36.8 C)] 98.1 F (36.7 C) (01/11 1109) Pulse Rate:  [56-93] 93 (01/11 1109) Resp:  [16-20] 20 (01/11 1109) BP: (106-133)/(62-96) 133/96 mmHg (01/11 1109) SpO2:  [93 %-100 %] 95 % (01/11 1109)  Weight change:  Filed Weights   06/05/15 0329 06/05/15 1001  Weight: 92.534 kg (204 lb) 91.899 kg (202 lb 9.6 oz)    Intake/Output: I/O last 3 completed shifts: In: 1337.5 [P.O.:1200; I.V.:137.5] Out: 700 [Urine:700]   Intake/Output this shift:  Total I/O In: 480 [P.O.:480] Out: 350 [Urine:350]  Physical Exam: General: NAD  Head: Normocephalic, atraumatic. Moist oral mucosal membranes  Eyes: Anicteric,   Neck: Supple, trachea midline  Lungs:   clear bilaterally   Heart: irregular  Abdomen:  Soft, nontender, +bowel sounds, distended  Extremities:  +peripheral edema.  Neurologic: Nonfocal, moving all four extremities  Skin: Ulcerated lesions bilateral lower extremities       Basic Metabolic Panel:  Recent Labs Lab 06/08/15 0936 06/09/15 1021 06/09/15 1517 06/10/15 1315 06/12/15 1136  NA 131* 131* 134* 137 135  K 5.8* 6.4* 5.9* 3.6 4.9  CL 97* 100* 99* 111 99*  CO2 _0 20* 23  GLUCOSE 103* 112* 142* 129* 112*  BUN 75* 84* 83* 71* 110*  CREATININE 3.68* 3.56* 3.26* 2.06* 3.07*  CALCIUM 9.3 9.4 9.5 7.2* 9.9    Liver Function Tests:  Recent Labs Lab 06/06/15 0459  AST 33  ALT 16  ALKPHOS 103  BILITOT 15.8*  PROT 6.5  ALBUMIN 3.0*   No results for input(s): LIPASE, AMYLASE in the last 168 hours. No results for input(s): AMMONIA in the last 168 hours.  CBC:  Recent Labs Lab 06/06/15 0459   WBC 10.7  HGB 7.0*  HCT 21.1*  MCV 99.2  PLT 247    Cardiac Enzymes: No results for input(s): CKTOTAL, CKMB, CKMBINDEX, TROPONINI in the last 168 hours.  BNP: Invalid input(s): POCBNP  CBG:  Recent Labs Lab 06/10/15 1651  GLUCAP 175*    Microbiology: Results for orders placed or performed in visit on 05/02/12  Urine culture     Status: None   Collection Time: 05/02/12  4:31 PM  Result Value Ref Range Status   Micro Text Report   Final       SOURCE: CLEAN CATCH    COMMENT                   NO GROWTH IN 36 HOURS   ANTIBIOTIC                                                        Coagulation Studies: No results for input(s): LABPROT, INR in the last 72 hours.  Urinalysis: No results for input(s): COLORURINE, LABSPEC, PHURINE, GLUCOSEU, HGBUR, BILIRUBINUR, KETONESUR, PROTEINUR, UROBILINOGEN, NITRITE, LEUKOCYTESUR in the last 72 hours.  Invalid input(s): APPERANCEUR    Imaging: US Abdomen Limited  06/11/2015  CLINICAL DATA:  Abdominal distention and discomfort. Assess for  ascites EXAM: LIMITED ABDOMEN ULTRASOUND FOR ASCITES TECHNIQUE: Limited ultrasound survey for ascites was performed in all four abdominal quadrants. COMPARISON:  06/05/2015 FINDINGS: Small amount of abdominal ascites in the right upper quadrant about the liver and in the left lower quadrant. No significant large volume of ascites demonstrated to warrant paracentesis. Nodular hepatic surface compatible with cirrhosis. IMPRESSION: Small amount of abdominal ascites. Not enough to warrant therapeutic paracentesis. Probable hepatic cirrhosis Electronically Signed   By: Jerilynn Mages.  Shick M.D.   On: 06/11/2015 11:04     Medications:   . sodium chloride 10 mL/hr at 06/11/15 2203  . furosemide (LASIX) infusion 4 mg/hr (06/11/15 1900)   . allopurinol  100 mg Oral Daily  . antiseptic oral rinse  7 mL Mouth Rinse BID  . calcitRIOL  0.25 mcg Oral Daily  . collagenase  1 application Topical Daily  .  cyclobenzaprine  5 mg Oral QHS  . folic acid  1 mg Oral Daily  . heparin lock flush  500 Units Intravenous Once  . hydroxyurea  500 mg Oral Daily  . ipratropium-albuterol  3 mL Nebulization Q6H  . metoprolol tartrate  25 mg Oral 3 times per day  . mometasone-formoterol  2 puff Inhalation BID  . polyethylene glycol  17 g Oral BID  . predniSONE  50 mg Oral Q breakfast  . rosuvastatin  5 mg Oral Daily  . sodium chloride  10 mL Intracatheter Q12H  . sodium chloride  3 mL Intravenous Q12H  . spironolactone  12.5 mg Oral Daily     Assessment/ Plan:  Jane Short is a 65 y.o. black female  with sickle cell disease, atrial myxoma who was admitted on 06/05/2015 for Acute pulmonary edema ([place]) [J81.0] Hypoxia [R09.02] Atrial fibrillation with rapid ventricular response ([place]) [I48.91] Elevated bilirubin [R17] Abdominal pain [R10.9]  1. 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 acute renal failure due to severe anemia, hypoxia and prerenal azotemia from poor PO intake and vomiting.  Potassium elevated: kayexalate, calcium gluconate and furosemide ordered Chronic Kidney Disease is secondary to NSAID use and sickle cell anemia.  - Renally dose all agents. Avoid nephrotoxic agents.  - not on an ACE-I/ARB as outpatient due to hyperkalemia.  - increase in serum creatinine is noted.  It may be due to intravascular volume changes Symptomatically, patient feels better Would continue Lasix drip for now and monitor renal function closely Avoid hypotension  2. 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 Hemoglobin back to her baseline at 7. - transfusion and erythropoietin as per hematology - hydroxyurea as outpatient.  3. Hypertension: blood pressure at goal. With atrial fibrillation and rapid ventricular response on admission.  - metoprolol for rate  control.     4. Secondary Hyperparathyroidism: PTH 250. Calcium at goal during admission.  - Continue calcitriol   5.  ascites and anasarca -  low-dose Lasix infusion and low-dose spironolactone - 2-D echo shows EF 50-55%, moderate mitral regurgitation, severe tricuspid regurgitation   LOS: 7 SINGH,HARMEET 1/11/20175:17 PM

## 2015-06-12 NOTE — Plan of Care (Signed)
Problem: Activity: Goal: Risk for activity intolerance will decrease Outcome: Progressing Patient is a moderate fall risk, remains independent in the room, uses bsc with ease.

## 2015-06-12 NOTE — Consult Note (Signed)
GI Inpatient Consult Note  Reason for Consult: elevated bilirubin, possible cirrhosis.    Attending Requesting Consult: Dr. Leslye Peer  History of Present Illness:  Jane Short is a 65 y.o. female seen for evaluation of elevated bilirubin at the request of Dr. Leslye Peer. PMHx of sickle cell anemia, HTN, COPD. She reports having acute onset of nausea/vomiting prior to admission. This resolved and has not had any additional episodes of nausea/vomiting since hospitalized (tolerating regular diet).  She was found to be in ARF in addition to baseline CKD as well as elevated bilirubin above her baseline at admission. She denies any GI symptoms except abdominal swelling. Reports abdominal swelling about 2 months. Associated w/ bilateral LE edema. She is on home lasix, repots taking 60 mg BID prior to admission. Was not on spironolactone until hospitalized. She denies any GERD, dysphagia, or epigastric pain. She will take Advil about 1x/week for joint pains. She denies any lower GI symptoms-having 2-3 BM/day, stools are loose-liquid without gross blood. Reports brown stools. She denies any abdominal pain. Her weight has been up 186-->208 over the past 2 months. She is also having lower extremity swelling which has improved during hospitalization.    Last Colonoscopy: June 2014-see below. Diverticulosis in the sigmoid colon in the distal descending colon. One 2 mm polyp in the distal sigmoid colon. Pathology distal sigmoid polyp-colonic mucosa with focal hyperplastic change. Negative for dysplasia and malignancy. Follow-up colonoscopy in 5 years.  Last Endoscopy: none prior.    Past Medical History:  Past Medical History  Diagnosis Date  . Sickle cell anemia (HCC)     sickle cell disease  . Vitamin D deficiency   . Hypertension   . Osteoporosis 2011    osteopenia  . 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 Cheree Ditto at [facility]  . Gout   . Sickle cell anemia (HCC)   . Lichen planus   . 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 Lichen planus [id number]  . COPD (chronic obstructive pulmonary disease) (HCC)     symptoms Dr. Patricia Pesa     Problem List: Patient Active Problem List   Diagnosis Date Noted  . CHF (congestive heart failure) ([place]) 06/05/2015  . Elevated bilirubin   . Sickle-cell/Hb-C disease with crisis ([place])   . Hernia, ventral 04/17/2015  . Lichen planus [id number]  . SOB (shortness of breath) 01/29/2015  . Chronic kidney disease (CKD), stage III (moderate) 01/17/2015  . Cough 01/17/2015  . Atherosclerosis of aorta ([place]) 01/10/2015  . Abnormal CXR 01/03/2015  . Bilateral low back pain without sciatica 01/03/2015  . Anemia of chronic renal failure 10/17/2014  . Hb-SS disease without crisis ([place]) 10/17/2014  . Iron overload 10/17/2014  . Left hip pain 12/12/2013  . Screening for colon cancer 08/04/2012  . Screening for breast cancer 08/04/2012  . Edema 11/19/2011  . Atrial myxoma 07/23/2011    Past Surgical History: Past Surgical History  Procedure Laterality Date  . Tubal ligation    . Gallbladder surgery    . Colonoscopy  2014    [facility]    Allergies: Allergies  Allergen Reactions  . Ramipril     cough    Home Medications: Prescriptions prior to admission  Medication Sig Dispense Refill Last Dose  . albuterol (PROVENTIL HFA;VENTOLIN HFA) 108 (90 BASE) MCG/ACT inhaler Inhale 2 puffs into the lungs every 6 (six) hours as needed. 18 g 6 unknown at unknown  . allopurinol (ZYLOPRIM) 100 MG tablet Take  100 mg by mouth daily.    06/04/2015 at Unknown time  . calcitRIOL (ROCALTROL) 0.25 MCG capsule Take 0.25 mcg by mouth daily.   06/04/2015 at Unknown time  . cyclobenzaprine (FLEXERIL) 5 MG tablet Take 1 tablet (5 mg total) by mouth at bedtime. 30 tablet 1 06/04/2015 at Unknown time  . folic acid (FOLVITE) 1 MG tablet Take 1 mg by mouth daily.   06/04/2015 at Unknown time  . furosemide (LASIX) 20 MG tablet Take 60 mg by mouth 2 (two) times daily.    06/04/2015  at Unknown time  . hydroxyurea (HYDREA) 500 MG capsule Take 500 mg by mouth daily. May take with food to minimize GI side effects.   Past Week at Unknown time  . losartan (COZAAR) 50 MG tablet Take 50 mg by mouth daily.   06/04/2015 at Unknown time  . mometasone-formoterol (DULERA) 200-5 MCG/ACT AERO Inhale 2 puffs into the lungs 2 (two) times daily. 1 Inhaler 12 06/04/2015 at Unknown time  . rosuvastatin (CRESTOR) 5 MG tablet Take 1 tablet (5 mg total) by mouth daily. 90 tablet 3 06/04/2015 at Unknown time  . SANTYL ointment Apply 1 application topically daily as needed.   06/04/2015 at Unknown time  . Umeclidinium Bromide (INCRUSE ELLIPTA) 62.5 MCG/INH AEPB Inhale 1 puff into the lungs daily. 30 each 5 06/04/2015 at Unknown time  . silver sulfADIAZINE (SILVADENE) 1 % cream Apply 1 application topically 2 (two) times daily. (Patient not taking: Reported on 06/05/2015) 50 g 0 Completed Course at Unknown time   Home medication reconciliation was completed with the patient.   Scheduled Inpatient Medications:   . allopurinol  100 mg Oral Daily  . antiseptic oral rinse  7 mL Mouth Rinse BID  . calcitRIOL  0.25 mcg Oral Daily  . collagenase  1 application Topical Daily  . cyclobenzaprine  5 mg Oral QHS  . folic acid  1 mg Oral Daily  . hydroxyurea  500 mg Oral Daily  . ipratropium-albuterol  3 mL Nebulization Q6H  . metoprolol tartrate  25 mg Oral 3 times per day  . mometasone-formoterol  2 puff Inhalation BID  . polyethylene glycol  17 g Oral BID  . predniSONE  50 mg Oral Q breakfast  . rosuvastatin  5 mg Oral Daily  . sodium chloride  10 mL Intracatheter Q12H  . sodium chloride  3 mL Intravenous Q12H  . spironolactone  12.5 mg Oral Daily    Continuous Inpatient Infusions:   . sodium chloride 10 mL/hr at 06/11/15 2203  . furosemide (LASIX) infusion 4 mg/hr (06/11/15 1900)    PRN Inpatient Medications:  acetaminophen **OR** acetaminophen, HYDROcodone-acetaminophen, ondansetron **OR** ondansetron  (ZOFRAN) IV, pentafluoroprop-tetrafluoroeth  Family History: She denies any family history of CRC, colon polyps, or liver disease in her family.  Social History:   reports that she quit smoking about 6 weeks ago. Her smoking use included Cigarettes. She has a 7.5 pack-year smoking history. She has never used smokeless tobacco. She reports that she does not drink alcohol or use illicit drugs.    Review of Systems: Constitutional: + weight gain.  Eyes: No changes in vision. ENT: No oral lesions, sore throat.  GI: see HPI.  Heme/Lymph: No easy bruising.  CV: No chest pain.  GU: No hematuria.  Integumentary: No rashes.  Neuro: No headaches.  Psych: No depression/anxiety.  Endocrine: No heat/cold intolerance.  Allergic/Immunologic: No urticaria.  Resp: No cough, SOB.  Musculoskeletal: No joint swelling.    Physical  Examination: BP 133/96 mmHg  Pulse 125  Temp(Src) 98.1 F (36.7 C) (Oral)  Resp 20  Ht 5\' 7"  (1.702 m)  Wt 202 lb 9.6 oz (91.899 kg)  BMI 31.72 kg/m2  SpO2 95% Gen: NAD, alert and oriented x 4 HEENT: PEERLA, EOMI, Neck: supple, no JVD or thyromegaly Chest: CTA bilaterally, no wheezes, crackles, or other adventitious sounds CV: RRR, no m/g/c/r Abd: obese, soft, NT, ND, +BS in all four quadrants; no HSM, guarding, ridigity, or rebound tenderness Ext: no edema, well perfused with 2+ pulses, Skin: no rash or lesions noted Lymph: no LAD  Data: Lab Results  Component Value Date   WBC 10.7 06/06/2015   HGB 7.0* 06/06/2015   HCT 21.1* 06/06/2015   MCV 99.2 06/06/2015   PLT 247 06/06/2015    Recent Labs Lab 06/06/15 0459  HGB 7.0*   Lab Results  Component Value Date   NA 137 06/10/2015   K 3.6 06/10/2015   CL 111 06/10/2015   CO2 20* 06/10/2015   BUN 71* 06/10/2015   CREATININE 2.06* 06/10/2015   GLU 89 02/20/2010   Lab Results  Component Value Date   ALT 16 06/06/2015   AST 33 06/06/2015   ALKPHOS 103 06/06/2015   BILITOT 15.8* 06/06/2015    No results for input(s): APTT, INR, PTT in the last 168 hours.     CT a/p 05/17/2015 IMPRESSION: 1. Small fat containing umbilical hernia. Small upper abdominal wall hernia along the midline containing a small amount of fluid. 2. Mild relative bowel wall thickening involving the duodenum and to a lesser extent jejunum as can be seen with enteritis. 3. Cardiomegaly. 4. Contrast in the distal esophagus as can be seen way gastroesophageal reflux. 5. Diffuse mild osteosclerosis as can be seen with renal Osteodystrophy.  Chest Xray 06/09/2015 IMPRESSION: Stably enlarged cardiac silhouette.  Bilateral lower lobe predominant patchy areas of airspace consolidation, worse from the prior radiograph. Differential diagnosis includes development of pulmonary edema versus multifocal pneumonia.  Underlying pulmonary vascular congestion.  RUQ [place] 06/05/2015 IMPRESSION: Small amount of abdominal ascites. Not enough to warrant therapeutic paracentesis.  Probable hepatic cirrhosis    Assessment/Plan: Ms. Castles is a 65 y.o. female admitted for acute onset N/V which have resolved, found to have acute renal failure with chronic CKD in addition to chronic anemia & worsening LFTS. Hx of sickle cell dx.    1. Abnormal LFTs/Cirrhosis - per chart review gradually elevating since at least 2013 when had negative evaluation with Hepatitis B & C testing. Normal platelets. No evidence of HE. Tbili baseline appears stable at 3-4 until 03/2015 and has been elevated since. Imaging unremarkable for bile duct abnormality. Will recheck labs tomorrow including INR to calculate MELD. Will order fibroscan to evaluate underlying fibrosis.    2. Ascites - minimal amount on imaging. Currently unable to increase diuretics due to ARF. May be able to increase spirolactone to 25mg  when Cr returns to baseline (1.6-1.9). Last BMP 06/10/15-needs repeat BMP to address electrolytes.   3. Anemia - most likely related to sickle  cell. She denies any increase in frequency of transfusions. Negative occult blood prior. Could consider EGD (none prior) based on her above CT and occasional NSAID use but overall likely low yield, will plan as outpatient.     Recommendations: 1. Check INR to calculate MELD & repeat CMP 2. Draw [place] labs, follow up on viral serologies and [place] labs.  3. Adjust diuretics if able to from renal standpoint with repeat BMP.  4. Fibroscan.  5. Consider EGD as outpatient.   *Case discussed w/ Dr. Candace Cruise.   Thank you for the consult. Please call with questions or concerns.  Ronney Asters, PA-C [place]

## 2015-06-12 NOTE — Progress Notes (Addendum)
PAC is clogged per oncology RN, Dr. Leslye Peer made aware, RN to call oncology and check with pharmacy to see if there is a clot-busting protocol. Pharmacy says to try using 500u heparin flush.  Update: 2:59 PM Per onc RN, protocol is typically a clot/dye study. Vascular radiology preforms this, Dr. Leslye Peer paged and made aware. MD okayed placement of dye study orders. Radiology called to get help with the finding and placing of correct order, RN left ascom number with tech to call back when she has the correct order found.  Update: 3:33 PM Order placed per radiology nurse guidance, patient is agreeable to procedure, reports she has had this done before. Vascular reports should be able to do study tomorrow around 1500.

## 2015-06-12 NOTE — Consult Note (Signed)
  Pt seen and examined. Please see Rushie Chestnut' notes. Pt with minimal ascites. On lasix IV and low dose aldactone. Possible cirrhosis based on U/S. No alcohol hx. No family hx. No UGI sxs now. Would consider baseline EGD later as outpatient if pt truly cirrhotic. Wanted to order fibroscan to evaluate liver. Unfortunately, results unreliable when ascites present according to radiology. Therefore, will cancel and arrange later if and when ascites resolve. Meanwhile, will order other liver serologies to explain possible causes of liver cirrhosis. Will follow. Thanks.

## 2015-06-12 NOTE — Progress Notes (Signed)
Patient wound care preformed, ulcers cleansed, redressed with santyl, per WOC order. Foam dressing in place, CDI: timed, dated and signed.

## 2015-06-12 NOTE — Progress Notes (Signed)
Attempted to access port on patient. Pt has a double port. Port near midline was accessed with no difficulty.  Site did not flush or aspirate blood.  Maddie RN notified.

## 2015-06-12 NOTE — Care Management (Signed)
Patient was placed on lasix drip 1/10.  Patient's PAC is clotted and making plans to  for procedure to declot.

## 2015-06-13 ENCOUNTER — Inpatient Hospital Stay: Payer: BLUE CROSS/BLUE SHIELD

## 2015-06-13 ENCOUNTER — Encounter: Payer: Self-pay | Admitting: *Deleted

## 2015-06-13 LAB — HEPATIC FUNCTION PANEL
ALBUMIN: 2.9 g/dL — AB (ref 3.5–5.0)
ALK PHOS: 124 U/L (ref 38–126)
ALT: 28 U/L (ref 14–54)
AST: 38 U/L (ref 15–41)
BILIRUBIN DIRECT: 8.3 mg/dL — AB (ref 0.1–0.5)
BILIRUBIN INDIRECT: 7.2 mg/dL — AB (ref 0.3–0.9)
BILIRUBIN TOTAL: 15.5 mg/dL — AB (ref 0.3–1.2)
Total Protein: 6.8 g/dL (ref 6.5–8.1)

## 2015-06-13 LAB — BASIC METABOLIC PANEL
Anion gap: 9 (ref 5–15)
BUN: 111 mg/dL — AB (ref 6–20)
CALCIUM: 9.7 mg/dL (ref 8.9–10.3)
CHLORIDE: 101 mmol/L (ref 101–111)
CO2: 24 mmol/L (ref 22–32)
CREATININE: 3.19 mg/dL — AB (ref 0.44–1.00)
GFR calc non Af Amer: 14 mL/min — ABNORMAL LOW (ref >60)
GFR, EST AFRICAN AMERICAN: 17 mL/min — AB (ref >60)
GLUCOSE: 133 mg/dL — AB (ref 65–99)
Potassium: 5.4 mmol/L — ABNORMAL HIGH (ref 3.5–5.1)
Sodium: 134 mmol/L — ABNORMAL LOW (ref 135–145)

## 2015-06-13 LAB — CBC
HCT: 19.7 % — ABNORMAL LOW (ref 35.0–47.0)
Hemoglobin: 6.8 g/dL — ABNORMAL LOW (ref 12.0–16.0)
MCH: 31.7 pg (ref 26.0–34.0)
MCHC: 34.3 g/dL (ref 32.0–36.0)
MCV: 92.5 fL (ref 80.0–100.0)
PLATELETS: 199 10*3/uL (ref 150–440)
RBC: 2.13 MIL/uL — ABNORMAL LOW (ref 3.80–5.20)
RDW: 21.2 % — ABNORMAL HIGH (ref 11.5–14.5)
WBC: 5.4 10*3/uL (ref 3.6–11.0)

## 2015-06-13 LAB — HEPATITIS B SURFACE ANTIBODY, QUANTITATIVE

## 2015-06-13 LAB — HEPATITIS B CORE ANTIBODY, TOTAL: Hep B Core Total Ab: NEGATIVE

## 2015-06-13 LAB — HEPATITIS B SURFACE ANTIGEN: HEP B S AG: NEGATIVE

## 2015-06-13 LAB — PROTIME-INR
INR: 1.37
PROTHROMBIN TIME: 17 s — AB (ref 11.4–15.0)

## 2015-06-13 LAB — CERULOPLASMIN: Ceruloplasmin: 53.6 mg/dL — ABNORMAL HIGH (ref 19.0–39.0)

## 2015-06-13 LAB — ANTINUCLEAR ANTIBODIES, IFA: ANTINUCLEAR ANTIBODIES, IFA: NEGATIVE

## 2015-06-13 MED ORDER — HEPARIN SOD (PORK) LOCK FLUSH 100 UNIT/ML IV SOLN
500.0000 [IU] | Freq: Once | INTRAVENOUS | Status: DC
  Filled 2015-06-13: qty 5

## 2015-06-13 MED ORDER — IOHEXOL 300 MG/ML  SOLN: 10.0000 mL | Freq: Once | INTRAMUSCULAR | Status: DC | PRN

## 2015-06-13 MED ORDER — HEPARIN SOD (PORK) LOCK FLUSH 100 UNIT/ML IV SOLN
INTRAVENOUS | Status: AC
  Filled 2015-06-13: qty 5

## 2015-06-13 MED ORDER — SODIUM CHLORIDE 0.9 % IJ SOLN
INTRAMUSCULAR | Status: AC
  Filled 2015-06-13: qty 3

## 2015-06-13 NOTE — Progress Notes (Signed)
Patient ID: Jane Short, female   DOB: 02/02/1951, 65 y.o.   MRN: [id number] [facility] PROGRESS NOTE  PCP: [name], MD  HPI/Subjective: Patient still with abdominal swelling. Breathing a little bit worse today.  Objective: Filed Vitals:   06/13/15 0435 06/13/15 1140  BP: 120/71 123/59  Pulse: 86 90  Temp: 97.8 F (36.6 C) 98 F (36.7 C)  Resp: 24 18    Intake/Output Summary (Last 24 hours) at 06/13/15 1444 Last data filed at 06/13/15 1415  Gross per 24 hour  Intake 358.83 ml  Output   1950 ml  Net -1591.17 ml   Filed Weights   06/05/15 0329 06/05/15 1001  Weight: 92.534 kg (204 lb) 91.899 kg (202 lb 9.6 oz)    ROS: Review of Systems  Constitutional: Negative for fever and chills.  Eyes: Negative for blurred vision.  Respiratory: Positive for shortness of breath. Negative for cough.   Cardiovascular: Negative for chest pain.  Gastrointestinal: Positive for abdominal pain. Negative for nausea, vomiting, diarrhea and constipation.  Genitourinary: Negative for dysuria.  Musculoskeletal: Negative for joint pain.  Neurological: Negative for dizziness and headaches.   Exam: Physical Exam  Constitutional: She is oriented to person, place, and time.  HENT:  Nose: No mucosal edema.  Mouth/Throat: No oropharyngeal exudate or posterior oropharyngeal edema.  Eyes: Conjunctivae, EOM and lids are normal. Pupils are equal, round, and reactive to light. Scleral icterus is present.  Neck: No JVD present. Carotid bruit is not present. No edema present. No thyroid mass and no thyromegaly present.  Cardiovascular: S1 normal and S2 normal.  Exam reveals no gallop.   Murmur heard.  Systolic murmur is present with a grade of 2/6  Pulses:      Dorsalis pedis pulses are 2+ on the right side, and 2+ on the left side.  Respiratory: No respiratory distress. She has decreased breath sounds in the right lower field and the left lower field. She has no  wheezes. She has no rhonchi. She has no rales.  GI: Soft. Bowel sounds are normal. There is no tenderness.  Musculoskeletal:       Right ankle: She exhibits no swelling.       Left ankle: She exhibits no swelling.  Lymphadenopathy:    She has no cervical adenopathy.  Neurological: She is alert and oriented to person, place, and time. No cranial nerve deficit.  Skin: Skin is warm. No rash noted. Nails show no clubbing.  Jaundiced  Psychiatric: She has a normal mood and affect.     Data Reviewed: Basic Metabolic Panel:  Recent Labs Lab 06/09/15 1021 06/09/15 1517 06/10/15 1315 06/12/15 1136 06/13/15 0455  NA 131* 134* 137 135 134*  K 6.4* 5.9* 3.6 4.9 5.4*  CL 100* 99* 111 99* 101  CO2 24 25 20* 23 24  GLUCOSE 112* 142* 129* 112* 133*  BUN 84* 83* 71* 110* 111*  CREATININE 3.56* 3.26* 2.06* 3.07* 3.19*  CALCIUM 9.4 9.5 7.2* 9.9 9.7   Liver Function Tests:  Recent Labs Lab 06/13/15 0455  AST 38  ALT 28  ALKPHOS 124  BILITOT 15.5*  PROT 6.8  ALBUMIN 2.9*   CBC:  Recent Labs Lab 06/13/15 0559  WBC 5.4  HGB 6.8*  HCT 19.7*  MCV 92.5  PLT 199   BNP (last 3 results)  Recent Labs  06/05/15 0407  BNP 683.0*    CBG:  Recent Labs Lab 06/10/15 1651  GLUCAP 175*  Scheduled Meds: . allopurinol  100 mg Oral Daily  . antiseptic oral rinse  7 mL Mouth Rinse BID  . calcitRIOL  0.25 mcg Oral Daily  . collagenase  1 application Topical Daily  . cyclobenzaprine  5 mg Oral QHS  . folic acid  1 mg Oral Daily  . heparin lock flush      . heparin lock flush  500 Units Intravenous Once  . hydroxyurea  500 mg Oral Daily  . ipratropium-albuterol  3 mL Nebulization Q6H  . metoprolol tartrate  25 mg Oral 3 times per day  . mometasone-formoterol  2 puff Inhalation BID  . polyethylene glycol  17 g Oral BID  . predniSONE  50 mg Oral Q breakfast  . rosuvastatin  5 mg Oral Daily  . sodium chloride  10 mL Intracatheter Q12H  . sodium chloride  3 mL Intravenous  Q12H  . sodium chloride      . spironolactone  12.5 mg Oral Daily   Continuous Infusions: . sodium chloride Stopped (06/12/15 1853)  . furosemide (LASIX) infusion 4 mg/hr (06/11/15 1900)    Assessment/Plan:  1. Abdominal distention and swelling, likely with cirrhosis- patient is currently on Lasix drip. Hold Aldactone with creatinine worsening and potassium going up. 2. Acute renal failure on chronic kidney disease with hyperkalemia- watch creatinine closely with the Lasix drip. Creatinine starting to increase again 3. Sickle cell disease status post 1 unit of transfusion with significant anemia. On hydroxyurea. Hemoglobin today 6.8 4. Jaundice, ultrasound of the abdomen consistent with cirrhosis. The hepatitis B panel is negative. Still waiting for hepatitis C, hemochromatosis profile, ANA and AMA. Not enough fluid for paracentesis. 5. Hyperlipidemia on Crestor 6. Essential hypertension- blood pressure on the lower side 7. History of COPD 8. Atrial fibrillation with rapid ventricular response on admission 9. Echocardiogram shows EF 50-55% with moderate mitral regurgitation and severe tricuspid regurgitation  Code Status:     Code Status Orders        Start     Ordered   06/05/15 1106  Full code   Continuous     06/05/15 1105    Code Status History    Date Active Date Inactive Code Status Order ID Comments User Context   This patient has a current code status but no historical code status.     Disposition Plan: To be determined  Time spent: 25 minutes  Loletha Grayer  [facility]

## 2015-06-13 NOTE — Consult Note (Signed)
  Pt in X-ray. Cr worse. On lasix/aldactone. Hep B neg. Other liver serologies are pending. Tbili/alk phos elevated but stable. Will need to careful of diuretics while renal function worsening. Will check on pt tomorrow. Thanks.

## 2015-06-13 NOTE — Progress Notes (Signed)
[facility]  ROUNDING NOTE   Subjective:   Patient states that she feels better overall Serum creatinine has again worsened to 3.19 No further chest pain or shortness of breath She was placed on Lasix infusion  She does report increased frequency of voiding but UOP not accurate as there is no foley  Objective:  Vital signs in last 24 hours:  Temp:  [97.4 F (36.3 C)-98 F (36.7 C)] 98 F (36.7 C) (01/12 1140) Pulse Rate:  [40-97] 90 (01/12 1140) Resp:  [18-24] 18 (01/12 1140) BP: (120-124)/(59-71) 123/59 mmHg (01/12 1140) SpO2:  [92 %-98 %] 95 % (01/12 0908)  Weight change:  Filed Weights   06/05/15 0329 06/05/15 1001  Weight: 92.534 kg (204 lb) 91.899 kg (202 lb 9.6 oz)    Intake/Output: I/O last 3 completed shifts: In: 1216.3 [P.O.:960; I.V.:256.3] Out: 2100 [Urine:2100]   Intake/Output this shift:  Total I/O In: -  Out: 900 [Urine:900]  Physical Exam: General: NAD  Head: Normocephalic, atraumatic. Moist oral mucosal membranes  Eyes: scleral icterus  Neck: Supple, trachea midline  Lungs:  Basilar crackles  Heart: Irregular, 3/6 murmur  Abdomen:  Soft, nontender, +bowel sounds, distended  Extremities:  +peripheral edema.  Neurologic: Alert, able to answer questions  Skin: Ulcerated lesions bilateral lower extremities       Basic Metabolic Panel:  Recent Labs Lab 06/09/15 1021 06/09/15 1517 06/10/15 1315 06/12/15 1136 06/13/15 0455  NA 131* 134* 137 135 134*  K 6.4* 5.9* 3.6 4.9 5.4*  CL 100* 99* 111 99* 101  CO2 24 25 20* 23 24  GLUCOSE 112* 142* 129* 112* 133*  BUN 84* 83* 71* 110* 111*  CREATININE 3.56* 3.26* 2.06* 3.07* 3.19*  CALCIUM 9.4 9.5 7.2* 9.9 9.7    Liver Function Tests:  Recent Labs Lab 06/13/15 0455  AST 38  ALT 28  ALKPHOS 124  BILITOT 15.5*  PROT 6.8  ALBUMIN 2.9*   No results for input(s): LIPASE, AMYLASE in the last 168 hours. No results for input(s): AMMONIA in the last 168 hours.  CBC:  Recent  Labs Lab 06/13/15 0559  WBC 5.4  HGB 6.8*  HCT 19.7*  MCV 92.5  PLT 199    Cardiac Enzymes: No results for input(s): CKTOTAL, CKMB, CKMBINDEX, TROPONINI in the last 168 hours.  BNP: Invalid input(s): POCBNP  CBG:  Recent Labs Lab 06/10/15 1651  GLUCAP 175*    Microbiology: Results for orders placed or performed in visit on 05/02/12  Urine culture     Status: None   Collection Time: 05/02/12  4:31 PM  Result Value Ref Range Status   Micro Text Report   Final       SOURCE: CLEAN CATCH    COMMENT                   NO GROWTH IN 36 HOURS   ANTIBIOTIC                                                        Coagulation Studies:  Recent Labs  06/13/15 0455  LABPROT 17.0*  INR 1.37    Urinalysis: No results for input(s): COLORURINE, LABSPEC, PHURINE, GLUCOSEU, HGBUR, BILIRUBINUR, KETONESUR, PROTEINUR, UROBILINOGEN, NITRITE, LEUKOCYTESUR in the last 72 hours.  Invalid input(s): APPERANCEUR    Imaging: No results found.  Medications:   . sodium chloride Stopped (06/12/15 1853)  . furosemide (LASIX) infusion 4 mg/hr (06/11/15 1900)   . allopurinol  100 mg Oral Daily  . antiseptic oral rinse  7 mL Mouth Rinse BID  . calcitRIOL  0.25 mcg Oral Daily  . collagenase  1 application Topical Daily  . cyclobenzaprine  5 mg Oral QHS  . folic acid  1 mg Oral Daily  . heparin lock flush      . heparin lock flush  500 Units Intravenous Once  . hydroxyurea  500 mg Oral Daily  . ipratropium-albuterol  3 mL Nebulization Q6H  . metoprolol tartrate  25 mg Oral 3 times per day  . mometasone-formoterol  2 puff Inhalation BID  . polyethylene glycol  17 g Oral BID  . predniSONE  50 mg Oral Q breakfast  . rosuvastatin  5 mg Oral Daily  . sodium chloride  10 mL Intracatheter Q12H  . sodium chloride  3 mL Intravenous Q12H  . sodium chloride      . spironolactone  12.5 mg Oral Daily     Assessment/ Plan:  [name] is a 65 y.o. black female  with sickle  cell disease, atrial myxoma who was admitted on 06/05/2015 for Acute pulmonary edema ([place]) [J81.0] Hypoxia [R09.02] Atrial fibrillation with rapid ventricular response ([place]) [I48.91] Elevated bilirubin [R17] Abdominal pain [R10.9]  1. 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 acute renal failure due to severe anemia, hypoxia and prerenal azotemia from poor PO intake and vomiting.  Potassium elevated: kayexalate, calcium gluconate and furosemide ordered Chronic Kidney Disease is secondary to NSAID use and sickle cell anemia.  - Renally dose all agents. Avoid nephrotoxic agents.  - not on an ACE-I/ARB as outpatient due to hyperkalemia.  - increase in serum creatinine is likely due to intravascular volume changes Symptomatically, patient feels better Would continue Lasix drip for now and monitor renal function closely ADD iv albumin Avoid hypotension  2. 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 Hemoglobin back to her baseline at 7. - transfusion and erythropoietin as per hematology - hydroxyurea as outpatient.  3. Hypertension: blood pressure at goal. With atrial fibrillation and rapid ventricular response on admission.  - metoprolol for rate control.     4. Secondary Hyperparathyroidism: PTH 250. Calcium at goal during admission.  - Continue calcitriol   5.  ascites and anasarca -  low-dose Lasix infusion and low-dose spironolactone (will d/c due to high K) - 2-D echo shows EF 50-55%, moderate mitral regurgitation, severe tricuspid regurgitation   LOS: 8 Jane Short 1/12/20173:17 PM

## 2015-06-13 NOTE — Progress Notes (Signed)
Nutrition Follow-up    INTERVENTION:   Meals and Snacks: Cater to patient preferences Medical Food Supplement Therapy: pt may benefit from addition of nutritional supplement to maximize protein intake, continue to assess   NUTRITION DIAGNOSIS:   Reassess on follow-up  GOAL:   Patient will meet greater than or equal to 90% of their needs   MONITOR:    (Energy Intake, Anthropometrics, Electrolyte/Renal Profile, Digestive System)  REASON FOR ASSESSMENT:   Diagnosis    ASSESSMENT:    Pt with abdominal distention and swelling, likely with cirrhosis, acute on CKD with hyperkalemia; pt currently on lasix drip    Diet Order:  Diet 2 gram sodium Room service appropriate?: Yes; Fluid consistency:: Thin   Energy Intake: pt eating 75-100% of meals  Electrolyte and Renal Profile:  Recent Labs Lab 06/10/15 1315 06/12/15 1136 06/13/15 0455  BUN 71* 110* 111*  CREATININE 2.06* 3.07* 3.19*  NA 137 135 134*  K 3.6 4.9 5.4*   Glucose Profile:  Recent Labs  06/10/15 1651  GLUCAP 175*   Protein Profile:  Recent Labs Lab 06/13/15 0455  ALBUMIN 2.9*    Meds: prednisone, lasix drip  Skin:  Reviewed, no issues  Last BM:  1/2  Height:   Ht Readings from Last 1 Encounters:  06/05/15 5\' 7"  (1.702 m)    Weight:   Wt Readings from Last 1 Encounters:  06/05/15 202 lb 9.6 oz (91.899 kg)    Filed Weights   06/05/15 0329 06/05/15 1001  Weight: 204 lb (92.534 kg) 202 lb 9.6 oz (91.899 kg)    BMI:  Body mass index is 31.72 kg/(m^2).  LOW Care Level   Kerman Passey MS, [place], LDN [phone] Pager  [phone] Weekend/On-Call Pager

## 2015-06-14 ENCOUNTER — Encounter
Admission: EM | Disposition: A | Discharge status: Other Acute Inpatient Hospital | Payer: Self-pay | Source: Home / Self Care | Attending: Internal Medicine

## 2015-06-14 HISTORY — PX: PERIPHERAL VASCULAR CATHETERIZATION: SHX172C

## 2015-06-14 LAB — BASIC METABOLIC PANEL
ANION GAP: 10 (ref 5–15)
BUN: 116 mg/dL — ABNORMAL HIGH (ref 6–20)
CALCIUM: 10.3 mg/dL (ref 8.9–10.3)
CO2: 25 mmol/L (ref 22–32)
CREATININE: 3.23 mg/dL — AB (ref 0.44–1.00)
Chloride: 100 mmol/L — ABNORMAL LOW (ref 101–111)
GFR, EST AFRICAN AMERICAN: 16 mL/min — AB (ref >60)
GFR, EST NON AFRICAN AMERICAN: 14 mL/min — AB (ref >60)
GLUCOSE: 113 mg/dL — AB (ref 65–99)
Potassium: 5.9 mmol/L — ABNORMAL HIGH (ref 3.5–5.1)
Sodium: 135 mmol/L (ref 135–145)

## 2015-06-14 LAB — MITOCHONDRIAL ANTIBODIES: MITOCHONDRIAL M2 AB, IGG: 31.1 U — AB (ref 0.0–20.0)

## 2015-06-14 LAB — ANTI-SMOOTH MUSCLE ANTIBODY, IGG: F-ACTIN AB IGG: 18 U (ref 0–19)

## 2015-06-14 LAB — PROTIME-INR
INR: 1.4
PROTHROMBIN TIME: 17.3 s — AB (ref 11.4–15.0)

## 2015-06-14 LAB — APTT: aPTT: 26 seconds (ref 24–36)

## 2015-06-14 SURGERY — DIALYSIS/PERMA CATHETER INSERTION
Anesthesia: Moderate Sedation | Laterality: Left

## 2015-06-14 MED ORDER — HEPARIN SODIUM (PORCINE) 10000 UNIT/ML IJ SOLN
INTRAMUSCULAR | Status: AC
  Filled 2015-06-14: qty 1

## 2015-06-14 MED ORDER — CEFUROXIME SODIUM 1.5 G IJ SOLR
INTRAMUSCULAR | Status: DC | PRN
  Administered 2015-06-14: 1.5 g via INTRAVENOUS

## 2015-06-14 MED ORDER — FENTANYL CITRATE (PF) 100 MCG/2ML IJ SOLN
INTRAMUSCULAR | Status: DC | PRN
  Administered 2015-06-14 (×2): 50 ug via INTRAVENOUS

## 2015-06-14 MED ORDER — HEPARIN (PORCINE) IN NACL 2-0.9 UNIT/ML-% IJ SOLN
INTRAMUSCULAR | Status: AC
  Filled 2015-06-14: qty 500

## 2015-06-14 MED ORDER — MORPHINE SULFATE (PF) 2 MG/ML IV SOLN
2.0000 mg | INTRAVENOUS | Status: DC | PRN
  Administered 2015-06-23: 2 mg via INTRAVENOUS
  Filled 2015-06-14: qty 1

## 2015-06-14 MED ORDER — MORPHINE SULFATE (PF) 2 MG/ML IV SOLN
2.0000 mg | Freq: Once | INTRAVENOUS | Status: AC
  Administered 2015-06-14: 2 mg via INTRAVENOUS
  Filled 2015-06-14: qty 1

## 2015-06-14 MED ORDER — FENTANYL CITRATE (PF) 100 MCG/2ML IJ SOLN
INTRAMUSCULAR | Status: AC
  Filled 2015-06-14: qty 2

## 2015-06-14 MED ORDER — LIDOCAINE-EPINEPHRINE (PF) 1 %-1:200000 IJ SOLN
INTRAMUSCULAR | Status: AC
  Filled 2015-06-14: qty 30

## 2015-06-14 SURGICAL SUPPLY — 7 items
CATH PALINDROME RT-P 15FX23CM (CATHETERS) ×2 IMPLANT
DERMABOND ADVANCED (GAUZE/BANDAGES/DRESSINGS) ×1
DERMABOND ADVANCED .7 DNX12 (GAUZE/BANDAGES/DRESSINGS) ×1 IMPLANT
PACK ANGIOGRAPHY (CUSTOM PROCEDURE TRAY) ×2 IMPLANT
SUT MNCRL AB 4-0 PS2 18 (SUTURE) ×2 IMPLANT
SUT SILK 0 FSL (SUTURE) ×2 IMPLANT
TOWEL OR 17X26 4PK STRL BLUE (TOWEL DISPOSABLE) ×2 IMPLANT

## 2015-06-14 NOTE — Op Note (Signed)
OPERATIVE NOTE   PROCEDURE: 1. Insertion of tunneled dialysis catheter left internal jugular approach with ultrasound and fluoroscopic guidance.  PRE-OPERATIVE DIAGNOSIS: Hepatorenal syndrome; acute renal failure  POST-OPERATIVE DIAGNOSIS: Same  SURGEON: Schnier, Dolores Lory.  ANESTHESIA: Local with IV fentanyl  ESTIMATED BLOOD LOSS: Minimal cc  CONTRAST USED:  None  FLUOROSCOPY TIME:  0.4 minutes  INDICATIONS:   Jane Dallas Wilsonis a 65 y.o. y.o. female who presents with hepatic and renal failure. She requires dialysis and is therefore undergoing placement of a tunneled catheter.  DESCRIPTION: After obtaining full informed written consent, the patient was positioned supine. The left neck and chest wall was prepped and draped in a sterile fashion. Ultrasound was placed in a sterile sleeve. Ultrasound was utilized to identify the left internal jugular vein which is noted to be echolucent and compressible indicating patency. Images recorded for the permanent record. Under real-time visualization a Seldinger needle is inserted into the vein and the guidewires advanced without difficulty. Small counterincision was made at the wire insertion site. Dilators are passed over the wire and the tunneled dialysis catheter is fed into the central venous system without difficulty.  Under fluoroscopy the catheter tip positioned at the atrial caval junction. The catheter is then approximated to the chest wall and an exit site selected. 1% lidocaine is infiltrated in soft tissues at this level small incision is made and the tunneling device is then passed from the exit site to the neck counterincision. Catheter is then connected to the tunneling device and the catheter was pulled subcutaneously. It is then transected and the hub assembly connected without difficulty. Both lumens aspirate and flush easily. After verification of smooth contour with proper tip position under fluoroscopy the catheter is packed  with 5000 units of heparin per lumen.  Catheter secured to the skin of the left chest wall with 0 silk. A sterile dressing is applied with a Biopatch.  COMPLICATIONS: None  CONDITION: Good  Schnier, Dolores Lory [place] [facility]. Office:  [phone]   06/14/2015,8:16 PM

## 2015-06-14 NOTE — Progress Notes (Signed)
Patient has rested quietly for most of the night. Minimal complaints of pain treated with PRN Vicodin. Patient complained of pain being in her abdomen and lower back. RN massaged patient's back which she said helped with the pain. No signs of discomfort or distress noted. Nursing staff will continue to monitor. Earleen Reaper, RN

## 2015-06-14 NOTE — Consult Note (Signed)
GI Inpatient Follow-up Note  Patient Identification: Jane Short is a 65 y.o. female  Subjective: Pt seen earlier. Cr worsening. T.bili higher though I don't have final results from today. To have HD today to rid of excess fluid. Liver serologies are non specific. Some back pain as well as abdominal pain due to abdominal distension.  Scheduled Inpatient Medications:  . [MAR Hold] allopurinol  100 mg Oral Daily  . [MAR Hold] antiseptic oral rinse  7 mL Mouth Rinse BID  . [MAR Hold] calcitRIOL  0.25 mcg Oral Daily  . [MAR Hold] collagenase  1 application Topical Daily  . [MAR Hold] cyclobenzaprine  5 mg Oral QHS  . [MAR Hold] folic acid  1 mg Oral Daily  . [MAR Hold] heparin lock flush  500 Units Intravenous Once  . [MAR Hold] hydroxyurea  500 mg Oral Daily  . [MAR Hold] ipratropium-albuterol  3 mL Nebulization Q6H  . [MAR Hold] metoprolol tartrate  25 mg Oral 3 times per day  . [MAR Hold] mometasone-formoterol  2 puff Inhalation BID  . [MAR Hold] polyethylene glycol  17 g Oral BID  . [MAR Hold] predniSONE  50 mg Oral Q breakfast  . [MAR Hold] sodium chloride  10 mL Intracatheter Q12H  . [MAR Hold] sodium chloride  3 mL Intravenous Q12H    Continuous Inpatient Infusions:   . sodium chloride Stopped (06/12/15 1853)  . furosemide (LASIX) infusion 4 mg/hr (06/13/15 2002)    PRN Inpatient Medications:  [MAR Hold] acetaminophen **OR** [MAR Hold] acetaminophen, [MAR Hold] HYDROcodone-acetaminophen, [MAR Hold] iohexol, [MAR Hold]  morphine injection, [MAR Hold] ondansetron **OR** [MAR Hold] ondansetron (ZOFRAN) IV, [MAR Hold] pentafluoroprop-tetrafluoroeth  Review of Systems: Constitutional: Weight is stable.  Eyes: No changes in vision. ENT: No oral lesions, sore throat.  GI: see HPI.  Heme/Lymph: No easy bruising.  CV: No chest pain.  GU: No hematuria.  Integumentary: No rashes.  Neuro: No headaches.  Psych: No depression/anxiety.  Endocrine: No heat/cold intolerance.   Allergic/Immunologic: No urticaria.  Resp: No cough, SOB.  Musculoskeletal: No joint swelling.    Physical Examination: BP 113/78 mmHg  Pulse 83  Temp(Src) 97.9 F (36.6 C) (Oral)  Resp 16  Ht 5\' 7"  (1.702 m)  Wt 91.899 kg (202 lb 9.6 oz)  BMI 31.72 kg/m2  SpO2 99% Gen: NAD, alert and oriented x 4 HEENT: PEERLA, EOMI, Neck: supple, no JVD or thyromegaly Chest: CTA bilaterally, no wheezes, crackles, or other adventitious sounds CV: RRR, no m/g/c/r Abd: soft, NT, distended with some tenderness, +BS in all four quadrants; no HSM, guarding, ridigity, or rebound tenderness Ext: no edema, well perfused with 2+ pulses, Skin: no rash or lesions noted Lymph: no LAD  Data: Lab Results  Component Value Date   WBC 5.4 06/13/2015   HGB 6.8* 06/13/2015   HCT 19.7* 06/13/2015   MCV 92.5 06/13/2015   PLT 199 06/13/2015    Recent Labs Lab 06/13/15 0559  HGB 6.8*   Lab Results  Component Value Date   NA 135 06/14/2015   K 5.9* 06/14/2015   CL 100* 06/14/2015   CO2 25 06/14/2015   BUN 116* 06/14/2015   CREATININE 3.23* 06/14/2015   GLU 89 02/20/2010   Lab Results  Component Value Date   ALT 28 06/13/2015   AST 38 06/13/2015   ALKPHOS 124 06/13/2015   BILITOT 15.5* 06/13/2015    Recent Labs Lab 06/14/15 1557  APTT 26  INR 1.40   Assessment/Plan: Ms. Inwood is a  65 y.o. female with worsening renal and liver failure.   Recommendations: Moniter liver and renal function over the weekend. If INR also starts to rise, then would recommend transfer to tertiary center. Consider liver biopsy early next week if no significant improvement of liver enzymes. Contact GI on call over the weekend if there are issues. Thanks. Please call with questions or concerns.  Jane Short, Jane Dawn, MD

## 2015-06-14 NOTE — Progress Notes (Addendum)
Paged dr. Candiss Norse for clarification for lasix drip continuation or discontinue, waiting for call back

## 2015-06-14 NOTE — Progress Notes (Signed)
[facility]  ROUNDING NOTE   Subjective:   Patient states that she feels bad today. Reports abdominal pain and back pain Serum creatinine has again worsened to 3.23, BUN also critically high Abdominal distention is worse She was placed on low dose Lasix infusion but it has not led to any significant clinical improvement   Objective:  Vital signs in last 24 hours:  Temp:  [97.4 F (36.3 C)-98 F (36.7 C)] 97.4 F (36.3 C) (01/13 1116) Pulse Rate:  [89-95] 89 (01/13 1116) Resp:  [18] 18 (01/13 1116) BP: (108-132)/(55-79) 132/76 mmHg (01/13 1116) SpO2:  [91 %-99 %] 99 % (01/13 1231)  Weight change:  Filed Weights   06/05/15 0329 06/05/15 1001  Weight: 92.534 kg (204 lb) 91.899 kg (202 lb 9.6 oz)    Intake/Output: I/O last 3 completed shifts: In: 311.4 [P.O.:120; I.V.:191.4] Out: 2800 [Urine:2800]   Intake/Output this shift:  Total I/O In: -  Out: 250 [Urine:250]  Physical Exam: General: NAD  Head: Normocephalic, atraumatic. Moist oral mucosal membranes  Eyes: scleral icterus  Neck: Supple, trachea midline  Lungs:  Basilar crackles  Heart: Irregular, 3/6 murmur  Abdomen:  Soft, nontender, +bowel sounds, distended  Extremities:  anasarca   Neurologic:  lethargic but able to answer questions  Skin: Ulcerated lesions bilateral lower extremities       Basic Metabolic Panel:  Recent Labs Lab 06/09/15 1517 06/10/15 1315 06/12/15 1136 06/13/15 0455 06/14/15 0507  NA 134* 137 135 134* 135  K 5.9* 3.6 4.9 5.4* 5.9*  CL 99* 111 99* 101 100*  CO2 25 20* 23 24 25   GLUCOSE 142* 129* 112* 133* 113*  BUN 83* 71* 110* 111* 116*  CREATININE 3.26* 2.06* 3.07* 3.19* 3.23*  CALCIUM 9.5 7.2* 9.9 9.7 10.3    Liver Function Tests:  Recent Labs Lab 06/13/15 0455  AST 38  ALT 28  ALKPHOS 124  BILITOT 15.5*  PROT 6.8  ALBUMIN 2.9*   No results for input(s): LIPASE, AMYLASE in the last 168 hours. No results for input(s): AMMONIA in the last 168  hours.  CBC:  Recent Labs Lab 06/13/15 0559  WBC 5.4  HGB 6.8*  HCT 19.7*  MCV 92.5  PLT 199    Cardiac Enzymes: No results for input(s): CKTOTAL, CKMB, CKMBINDEX, TROPONINI in the last 168 hours.  BNP: Invalid input(s): POCBNP  CBG:  Recent Labs Lab 06/10/15 1651  GLUCAP 175*    Microbiology: Results for orders placed or performed in visit on 05/02/12  Urine culture     Status: None   Collection Time: 05/02/12  4:31 PM  Result Value Ref Range Status   Micro Text Report   Final       SOURCE: CLEAN CATCH    COMMENT                   NO GROWTH IN 36 HOURS   ANTIBIOTIC                                                        Coagulation Studies:  Recent Labs  06/13/15 0455  LABPROT 17.0*  INR 1.37    Urinalysis: No results for input(s): COLORURINE, LABSPEC, PHURINE, GLUCOSEU, HGBUR, BILIRUBINUR, KETONESUR, PROTEINUR, UROBILINOGEN, NITRITE, LEUKOCYTESUR in the last 72 hours.  Invalid input(s): APPERANCEUR  Imaging: Dg Fluoro Guide Cv Line Right  06/13/2015  CLINICAL DATA:  Clinical suspicion of indwelling Port-A-Cath occlusion. EXAM: CONTRAST INJECTION OF PORT A CATH UNDER FLUOROSCOPY CONTRAST:  8 mL Omnipaque 300 FLUOROSCOPY TIME:  12 seconds PROCEDURE: Contrast was administered via the indwelling port after it was accessed. Fluoroscopic spot images were obtained of the catheter during injection. FINDINGS: Initial fluoroscopy demonstrates some redundancy of the tunneled port catheter in the subcutaneous tissues with partial loop formation at the base of the jugular vein. The catheter tip lies in the expected position of the brachiocephalic venous confluence. The medial of 2 port lumens was accessed. With aspiration, there was free return of blood. Contrast injection shows normal patency with free flow of contrast beyond the tip of the catheter into the central veins. There is no evidence of contrast extravasation. No significant fibrin sheath identified. The  patient was extremely agitated and could barely tolerate the single lumen injection. The lateral lumen was not accessed for a separate injection procedure. After contrast injection, the port was flushed with heparinized saline. IMPRESSION: There is some retraction of the port catheter tip to the level of the brachiocephalic venous confluence due to partial looping of the catheter at the base of the jugular vein and some redundancy of the tunneled portion of the catheter in the soft tissues. The medial lumen was accessed with free return of blood with aspiration. There was excellent flow of contrast material in this lumen and into the central veins without evidence of obstruction or significant fibrin sheath. Electronically Signed   By: Aletta Edouard M.D.   On: 06/13/2015 15:16     Medications:   . sodium chloride Stopped (06/12/15 1853)  . furosemide (LASIX) infusion 4 mg/hr (06/13/15 2002)   . allopurinol  100 mg Oral Daily  . antiseptic oral rinse  7 mL Mouth Rinse BID  . calcitRIOL  0.25 mcg Oral Daily  . collagenase  1 application Topical Daily  . cyclobenzaprine  5 mg Oral QHS  . folic acid  1 mg Oral Daily  . heparin lock flush  500 Units Intravenous Once  . hydroxyurea  500 mg Oral Daily  . ipratropium-albuterol  3 mL Nebulization Q6H  . metoprolol tartrate  25 mg Oral 3 times per day  . mometasone-formoterol  2 puff Inhalation BID  . polyethylene glycol  17 g Oral BID  . predniSONE  50 mg Oral Q breakfast  . rosuvastatin  5 mg Oral Daily  . sodium chloride  10 mL Intracatheter Q12H  . sodium chloride  3 mL Intravenous Q12H     Assessment/ Plan:  [name] is a 65 y.o. black female  with sickle cell disease, atrial myxoma who was admitted on 06/05/2015 for Acute pulmonary edema ([place]) [J81.0] Hypoxia [R09.02] Atrial fibrillation with rapid ventricular response ([place]) [I48.91] Elevated bilirubin [R17] Abdominal pain [R10.9]  1. Acute renal failure on Chronic  Kidney Disease stage IV with hyperkalemia:  baseline creatinine of 2.1, eGFR of 28 03/13/2015. acute renal failure due to severe anemia, hypoxia and prerenal azotemia from poor PO intake and vomiting.  Potassium elevated: kayexalate, calcium gluconate and furosemide ordered Chronic Kidney Disease is secondary to NSAID use and sickle cell anemia.  - Renally dose all agents. Avoid nephrotoxic agents.  - not on an ACE-I/ARB as outpatient due to hyperkalemia.  - increase in serum creatinine is likely due to intravascular volume changes ADD iv albumin Avoid hypotension Renal failure has worsened  Discussed dialysis  with patient and her husband. They have agreed  Consult vascular for PermCath/non-tunneled dialysis catheter placement  Short dialysis today   2. 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 Hemoglobin back to her baseline at 7. - transfusion and erythropoietin as per hematology/IM - hydroxyurea as outpatient.  3. Hypertension: blood pressure at goal. With atrial fibrillation and rapid ventricular response on admission.  - metoprolol for rate control.     4. Secondary Hyperparathyroidism: PTH 250. Calcium at goal during admission.  - Continue calcitriol   5.  ascites and anasarca -  low-dose Lasix infusion and low-dose spironolactone (will d/c due to high K) - 2-D echo shows EF 50-55%, moderate mitral regurgitation, severe tricuspid regurgitation  6. Hyperkalemia - Expected to correct with hemodialysis   LOS: 9 Emory Gallentine 1/13/201712:45 PM

## 2015-06-14 NOTE — Care Management (Signed)
There is discussion that patient to going to require hemodialysis.  Her BUN is now up to 116 and creatinine 3.23.  Remains on lasix drip

## 2015-06-14 NOTE — OR Nursing (Signed)
IV Lasix stopped around 1600 per [facility] staff, incompatable with procedure medications, Report called to [place] RN 2A nurse, she said Dr wrote in progress note to stop Lasix drip when starts dialysis. Will not resume Lasix drip post perm cat insertion since pt going straight to dialysis.

## 2015-06-14 NOTE — Consult Note (Signed)
[facility] Vascular Consult Note  MRN : [id number]  Jane Short is a 65 y.o. (1951/03/05) female who presents with chief complaint of  Chief Complaint  Patient presents with  . Emesis  .  History of Present Illness: The patient is a 65 y.o. female seen for evaluation of elevated bilirubin at the request of Dr. Leslye Peer. PMHx of sickle cell anemia, HTN, COPD. She reports having acute onset of nausea/vomiting prior to admission. This resolved and has not had any additional episodes of nausea/vomiting since hospitalized (tolerating regular diet).  She was found to be in ARF in addition to baseline CKD IV as well as elevated bilirubin above her baseline at admission. No history of any GI symptoms except abdominal swelling. There is documentation of abdominal swelling about 2 months which is associated w/ bilateral LE edema.  Was not on spironolactone until hospitalized. She has previously denied any GERD, dysphagia, or epigastric pain.  She  has previously denied any lower GI symptoms-having 2-3 BM/day, stools are loose-liquid without gross blood. No recent history of bloody bowel movements. No recent history of constipation.  Her weight has been up 186-->208 over the past 2 months.   She has become increasingly obtunded and more and more uremic. For these reasons she will require hemodialysis. She is therefore undergoing placement of a tunneled dialysis catheter. Given her overall condition however no further plans for permanent access at this time  Current Facility-Administered Medications  Medication Dose Route Frequency Provider Last Rate Last Dose  . 0.9 %  sodium chloride infusion   Intravenous Continuous Epifanio Lesches, MD   Stopped at 06/12/15 1853  . [MAR Hold] acetaminophen (TYLENOL) tablet 650 mg  650 mg Oral Q6H PRN Gladstone Lighter, MD   650 mg at 06/05/15 1126   Or  . [MAR Hold] acetaminophen (TYLENOL) suppository 650 mg  650 mg Rectal Q6H PRN  Gladstone Lighter, MD      . Doug Sou Hold] allopurinol (ZYLOPRIM) tablet 100 mg  100 mg Oral Daily Gladstone Lighter, MD   100 mg at 06/14/15 0953  . [MAR Hold] antiseptic oral rinse (CPC / CETYLPYRIDINIUM CHLORIDE 0.05%) solution 7 mL  7 mL Mouth Rinse BID Gladstone Lighter, MD   7 mL at 06/12/15 2200  . [MAR Hold] calcitRIOL (ROCALTROL) capsule 0.25 mcg  0.25 mcg Oral Daily Gladstone Lighter, MD   0.25 mcg at 06/14/15 0952  . [MAR Hold] collagenase (SANTYL) ointment 1 application  1 application Topical Daily Gladstone Lighter, MD   1 application at 0000000 0953  . [MAR Hold] cyclobenzaprine (FLEXERIL) tablet 5 mg  5 mg Oral QHS Gladstone Lighter, MD   5 mg at 06/13/15 2227  . [MAR Hold] folic acid (FOLVITE) tablet 1 mg  1 mg Oral Daily Gladstone Lighter, MD   1 mg at 06/14/15 0953  . furosemide (LASIX) 250 mg in dextrose 5 % 250 mL (1 mg/mL) infusion  4 mg/hr Intravenous Continuous Murlean Iba, MD 4 mL/hr at 06/13/15 2002 4 mg/hr at 06/13/15 2002  . [MAR Hold] heparin lock flush 100 unit/mL  500 Units Intravenous Once Aletta Edouard, MD   500 Units at 06/13/15 1333  . [MAR Hold] HYDROcodone-acetaminophen (NORCO/VICODIN) 5-325 MG per tablet 1 tablet  1 tablet Oral Q6H PRN Gladstone Lighter, MD   1 tablet at 06/14/15 0753  . [MAR Hold] hydroxyurea (HYDREA) capsule 500 mg  500 mg Oral Daily Gladstone Lighter, MD   500 mg at 06/14/15 0953  . [MAR Hold] iohexol (  OMNIPAQUE) 300 MG/ML solution 10 mL  10 mL Other Once PRN Aletta Edouard, MD      . Doug Sou Hold] ipratropium-albuterol (DUONEB) 0.5-2.5 (3) MG/3ML nebulizer solution 3 mL  3 mL Nebulization Q6H Epifanio Lesches, MD   3 mL at 06/14/15 0913  . [MAR Hold] metoprolol tartrate (LOPRESSOR) tablet 25 mg  25 mg Oral 3 times per day Gladstone Lighter, MD   25 mg at 06/14/15 0515  . [MAR Hold] mometasone-formoterol (DULERA) 200-5 MCG/ACT inhaler 2 puff  2 puff Inhalation BID Gladstone Lighter, MD   2 puff at 06/14/15 0753  . [MAR Hold] morphine 2 MG/ML  injection 2 mg  2 mg Intravenous Q4H PRN Loletha Grayer, MD      . Doug Sou Hold] ondansetron [facility]) tablet 4 mg  4 mg Oral Q6H PRN Gladstone Lighter, MD       Or  . Doug Sou Hold] ondansetron (ZOFRAN) injection 4 mg  4 mg Intravenous Q6H PRN Gladstone Lighter, MD      . Doug Sou Hold] pentafluoroprop-tetrafluoroeth (GEBAUERS) aerosol   Topical PRN Epifanio Lesches, MD      . Doug Sou Hold] polyethylene glycol (MIRALAX / GLYCOLAX) packet 17 g  17 g Oral BID Lance Coon, MD   17 g at 06/12/15 0959  . [MAR Hold] predniSONE (DELTASONE) tablet 50 mg  50 mg Oral Q breakfast Epifanio Lesches, MD   50 mg at 06/14/15 0754  . [MAR Hold] sodium chloride 0.9 % injection 10 mL  10 mL Intracatheter Q12H Epifanio Lesches, MD   10 mL at 06/13/15 2232  . [MAR Hold] sodium chloride 0.9 % injection 3 mL  3 mL Intravenous Q12H Gladstone Lighter, MD   3 mL at 06/09/15 2246   Facility-Administered Medications Ordered in Other Encounters  Medication Dose Route Frequency Provider Last Rate Last Dose  . heparin lock flush 100 unit/mL  250 Units Intracatheter PRN Leia Alf, MD      . sodium chloride 0.9 % injection 10 mL  10 mL Intravenous PRN Leia Alf, MD   10 mL at 02/20/15 1530  . sodium chloride 0.9 % injection 10 mL  10 mL Intravenous PRN Leia Alf, MD   10 mL at 02/20/15 1530  . sodium chloride 0.9 % injection 3 mL  3 mL Intracatheter PRN Leia Alf, MD   3 mL at 10/05/14 [id number]    Past Medical History  Diagnosis Date  . Sickle cell anemia (HCC)     sickle cell disease  . Vitamin D deficiency   . Hypertension   . Osteoporosis 2011    osteopenia  . 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 Cheree D[facility] at [facility]  . Gout   . Sickle cell anemia (HCC)   . Lichen planus   . 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 Lichen planus [id number]  . COPD (chronic obstructive pulmonary disease) (HCC)     symptoms Dr. Patricia Pesa     Past Surgical History  Procedure Laterality Date  . Tubal  ligation    . Gallbladder surgery    . Colonoscopy  2014    [facility]    Social History Social History  Substance Use Topics  . Smoking status: Former Smoker -- 0.25 packs/day for 30 years    Types: Cigarettes    Quit date: 04/29/2015  . Smokeless tobacco: Never Used     Comment: quit 1 month ago  . Alcohol Use: No    Family History Family History  Problem  Relation Age of Onset  . Heart disease Father   . Diabetes Father   . Diabetes Sister   . Cancer Mother     breast cancer  . Cancer Maternal Aunt     Breast cancer  no family history of porphyria, bleeding clotting disorders or autoimmune dysfunction   Allergies  Allergen Reactions  . Ramipril     cough     REVIEW OF SYSTEMS per review of the history the patient is unable to answer even yes or no questions (Negative unless checked)  Constitutional: [] Weight loss  [] Fever  [] Chills Cardiac: [] Chest pain   [] Chest pressure   [] Palpitations   [] Shortness of breath when laying flat   [] Shortness of breath at rest   [] Shortness of breath with exertion. Vascular:  [] Pain in legs with walking   [] Pain in legs at rest   [] Pain in legs when laying flat   [] Claudication   [] Pain in feet when walking  [] Pain in feet at rest  [] Pain in feet when laying flat   [] History of DVT   [] Phlebitis   [] Swelling in legs   [] Varicose veins   [] Non-healing ulcers Pulmonary:   [] Uses home oxygen   [] Productive cough   [] Hemoptysis   [] Wheeze  [] COPD   [] Asthma Neurologic:  [] Dizziness  [] Blackouts   [] Seizures   [] History of stroke   [] History of TIA  [] Aphasia   [] Temporary blindness   [] Dysphagia   [] Weakness or numbness in arms   [] Weakness or numbness in legs Musculoskeletal:  [] Arthritis   [] Joint swelling   [] Joint pain   [] Low back pain Hematologic:  [] Easy bruising  [] Easy bleeding   [] Hypercoagulable state   [] Anemic  [] Hepatitis Gastrointestinal:  [] Blood in stool   [] Vomiting blood  [] Gastroesophageal reflux/heartburn   [] Difficulty  swallowing. Genitourinary:  [] Chronic kidney disease   [] Difficult urination  [] Frequent urination  [] Burning with urination   [] Blood in urine Skin:  [] Rashes   [] Ulcers   [] Wounds Psychological:  [] History of anxiety   []  History of major depression.  Physical Examination  Filed Vitals:   06/14/15 0746 06/14/15 1116 06/14/15 1231 06/14/15 1533  BP:  132/76  113/78  Pulse:  89  83  Temp:  97.4 F (36.3 C)  97.9 F (36.6 C)  TempSrc:  Oral  Oral  Resp:  18  16  Height:      Weight:      SpO2: 97% 92% 99% 99%   Body mass index is 31.72 kg/(m^2). Gen:  WD/WN, NAD Head: [place]/AT, No temporalis wasting. Prominent temp pulse not noted. Ear/Nose/Throat: Hearing grossly intact, nares w/o erythema or drainage, oropharynx w/o Erythema/Exudate Eyes: PERRLA, EOMI.sclera are markedly icteric  Neck: Supple, no nuchal rigidity.  No bruit or JVD.  Pulmonary:  Good air movement, clear to auscultation bilaterally.  Cardiac: RRR, normal S1, S2, no Murmurs, rubs or gallops. Vascular:  Vessel Right Left  Radial Palpable Palpable  Ulnar Palpable Palpable  Brachial Palpable Palpable  Carotid Palpable, without bruit Palpable, without bruit  Aorta Not palpable N/A  Femoral Palpable Palpable  Popliteal Palpable Palpable  PT Palpable Palpable  DP Palpable Palpable   Gastrointestinal: Tense and markedly distended, no  guarding/reflex. No masses, surgical incisions, or scars. Musculoskeletal: M/S 5/5 throughout.  Extremities without ischemic changes.  No deformity or atrophy. No edema. Neurologic: CN 2-12 intact. Pain and light touch intact in extremities.  Symmetrical.  Speech is fluent. Motor exam as listed above. Psychiatric: Judgment intact, Mood & affect  appropriate for pt's clinical situation. Dermatologic: No rashes or ulcers noted.  No cellulitis or open wounds. Lymph : No Cervical, Axillary, or Inguinal lymphadenopathy.   CBC Lab Results  Component Value Date   WBC 5.4 06/13/2015   HGB  6.8* 06/13/2015   HCT 19.7* 06/13/2015   MCV 92.5 06/13/2015   PLT 199 06/13/2015    BMET    Component Value Date/Time   NA 135 06/14/2015 0507   NA 138 06/20/2012 1400   NA 141 02/20/2010   K 5.9* 06/14/2015 0507   K 4.3 06/20/2012 1400   CL 100* 06/14/2015 0507   CL 105 06/20/2012 1400   CO2 25 06/14/2015 0507   CO2 24 06/20/2012 1400   GLUCOSE 113* 06/14/2015 0507   GLUCOSE 102* 06/20/2012 1400   BUN 116* 06/14/2015 0507   BUN 13 06/20/2012 1400   BUN 14 02/20/2010   CREATININE 3.23* 06/14/2015 0507   CREATININE 1.36* 03/22/2013 1522   CREATININE 1.1 02/20/2010   CALCIUM 10.3 06/14/2015 0507   CALCIUM 9.4 06/20/2012 1400   GFRNONAA 14* 06/14/2015 0507   GFRNONAA 42* 03/22/2013 1522   GFRAA 16* 06/14/2015 0507   GFRAA 48* 03/22/2013 1522   Estimated Creatinine Clearance: 20.5 mL/min (by C-G formula based on Cr of 3.23).  COAG Lab Results  Component Value Date   INR 1.40 06/14/2015   INR 1.37 06/13/2015    Radiology Ct Abdomen Pelvis Wo Contrast  05/17/2015  CLINICAL DATA:  Bilateral groin pain for several months. EXAM: CT ABDOMEN AND PELVIS WITHOUT CONTRAST TECHNIQUE: Multidetector CT imaging of the abdomen and pelvis was performed following the standard protocol without IV contrast. COMPARISON:  07/12/2012 FINDINGS: Lower chest: Mild bibasilar interstitial thickening. Cardiomegaly. Contrast in the distal esophagus as can be seen way gastroesophageal reflux. Hepatobiliary: Normal liver. Small amount of perihepatic ascites. Prior cholecystectomy. Pancreas: Normal. Spleen: Prior splenectomy. Adrenals/Urinary Tract: Normal adrenal glands. Normal right kidney. 2.8 x 2.4 cm hypodense, fluid attenuating left renal mass with a punctate peripheral calcification most consistent with a mildly complicated cyst similar in size to the prior exam of 07/12/2012. No urolithiasis or obstructive uropathy. Stomach/Bowel: No bowel dilatation. Mild relative bowel wall thickening involving  the duodenum and to a lesser extent jejunum as can be seen with enteritis. No pneumatosis, pneumoperitoneum or portal venous gas. Moderate amount of pelvic free fluid. Small fat containing umbilical hernia. Small upper abdominal wall hernia along the midline containing a small amount of fluid. Vascular/Lymphatic: Normal caliber abdominal aorta with atherosclerosis. No lymphadenopathy. Reproductive: Normal uterus.  No adnexal mass. Other: Generalized soft tissue edema and skin thickening of the anterior abdominal wall. Musculoskeletal: No aggressive osseous lesion. 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 Broad-based calcified disc bulge at T11-12. Fatty atrophy of the upper right rectus muscle. IMPRESSION: 1. Small fat containing umbilical hernia. Small upper abdominal wall hernia along the midline containing a small amount of fluid. 2. Mild relative bowel wall thickening involving the duodenum and to a lesser extent jejunum as can be seen with enteritis. 3. Cardiomegaly. 4. Contrast in the distal esophagus as can be seen way gastroesophageal reflux. 5. 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 Electronically Signed   By: Kathreen Devoid   On: 05/17/2015 09:54   Dg Chest 1 View  06/09/2015  CLINICAL DATA:  History of COPD, hypertension, former smoker. Sickle cell disease. EXAM: CHEST 1 VIEW COMPARISON:  06/05/2015 FINDINGS: Right internal jugular approach central venous catheter is unchanged  in position. The cardiac silhouette is stably enlarged. Mediastinal contours appear intact. There are bilateral lower lobe predominant patchy areas of airspace consolidation, on the background of increased interstitial markings. There is no evidence of pneumothorax or pleural effusions. Osseous structures are without acute abnormality. Soft tissues are grossly normal. IMPRESSION: Stably enlarged cardiac silhouette. Bilateral lower lobe predominant patchy areas of airspace  consolidation, worse from the prior radiograph. Differential diagnosis includes development of pulmonary edema versus multifocal pneumonia. Underlying pulmonary vascular congestion. Electronically Signed   By: Fidela Salisbury M.D.   On: 06/09/2015 10:52   US Abdomen Limited  06/11/2015  CLINICAL DATA:  Abdominal distention and discomfort. Assess for ascites EXAM: LIMITED ABDOMEN ULTRASOUND FOR ASCITES TECHNIQUE: Limited ultrasound survey for ascites was performed in all four abdominal quadrants. COMPARISON:  06/05/2015 FINDINGS: Small amount of abdominal ascites in the right upper quadrant about the liver and in the left lower quadrant. No significant large volume of ascites demonstrated to warrant paracentesis. Nodular hepatic surface compatible with cirrhosis. IMPRESSION: Small amount of abdominal ascites. Not enough to warrant therapeutic paracentesis. Probable hepatic cirrhosis Electronically Signed   By: Jerilynn Mages.  Shick M.D.   On: 06/11/2015 11:04   Dg Chest Portable 1 View  06/05/2015  CLINICAL DATA:  Acute onset of nausea, vomiting and generalized chest pain. Initial encounter. EXAM: PORTABLE CHEST 1 VIEW COMPARISON:  Chest radiograph performed 12/30/2014, and CT of the chest performed 01/10/2015 FINDINGS: The lungs are well-aerated. Vascular congestion is noted, with mildly increased interstitial markings, raising concern for mild interstitial edema. There is no evidence of pleural effusion or pneumothorax. The cardiomediastinal silhouette is enlarged. A right-sided chest port is noted ending about the proximal SVC. No acute osseous abnormalities are seen. IMPRESSION: Vascular congestion and cardiomegaly, with mildly increased interstitial markings, raising concern for mild interstitial edema. Electronically Signed   By: Garald Balding M.D.   On: 06/05/2015 04:28   Dg Fluoro Guide Cv Line Right  06/13/2015  CLINICAL DATA:  Clinical suspicion of indwelling Port-A-Cath occlusion. EXAM: CONTRAST INJECTION  OF PORT A CATH UNDER FLUOROSCOPY CONTRAST:  8 mL Omnipaque 300 FLUOROSCOPY TIME:  12 seconds PROCEDURE: Contrast was administered via the indwelling port after it was accessed. Fluoroscopic spot images were obtained of the catheter during injection. FINDINGS: Initial fluoroscopy demonstrates some redundancy of the tunneled port catheter in the subcutaneous tissues with partial loop formation at the base of the jugular vein. The catheter tip lies in the expected position of the brachiocephalic venous confluence. The medial of 2 port lumens was accessed. With aspiration, there was free return of blood. Contrast injection shows normal patency with free flow of contrast beyond the tip of the catheter into the central veins. There is no evidence of contrast extravasation. No significant fibrin sheath identified. The patient was extremely agitated and could barely tolerate the single lumen injection. The lateral lumen was not accessed for a separate injection procedure. After contrast injection, the port was flushed with heparinized saline. IMPRESSION: There is some retraction of the port catheter tip to the level of the brachiocephalic venous confluence due to partial looping of the catheter at the base of the jugular vein and some redundancy of the tunneled portion of the catheter in the soft tissues. The medial lumen was accessed with free return of blood with aspiration. There was excellent flow of contrast material in this lumen and into the central veins without evidence of obstruction or significant fibrin sheath. Electronically Signed   By: Jenness Corner.D.  On: 06/13/2015 15:16   US Abdomen Limited Ruq  06/05/2015  CLINICAL DATA:  Chronic generalized abdominal pain. Elevated bilirubin. Initial encounter. EXAM: US ABDOMEN LIMITED - RIGHT UPPER QUADRANT COMPARISON:  CT of the abdomen and pelvis from 05/17/2015 FINDINGS: Gallbladder: Status post cholecystectomy.  No retained stones seen. Common bile duct:  Diameter: 0.3 cm, within normal limits in caliber. Liver: No focal lesion identified. The liver appears somewhat enlarged. The nodular contour of the liver is better characterized on recent CT. Pulsatile flow is noted within the portal vein. 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 A small amount of ascites is noted within all four quadrants of the abdomen. IMPRESSION: 1. Mild hepatomegaly. Nodular contour of the liver is better characterized on recent CT, compatible with mild hepatic cirrhosis. 2. Small amount of abdominal ascites noted. 3. Status post cholecystectomy.  No retained stones seen. Electronically Signed   By: Garald Balding M.D.   On: 06/05/2015 06:18    Assessment/Plan 1.  Acute on chronic renal insufficiency.: Patient will require hemodialysis and therefore is undergoing placement of a tunneled catheter. Rest benefits of been reviewed family is in agreement with proceeding. Given her overall condition she does not appear to be a candidate for any further dialysis access workup. Perhaps with dialysis and resolution of her hepatic insufficiency imaging of her upper extremities for a AV access can't be performed but this will be at a later date.  2.  Acute hypoxic respiratory failure-secondary to acute pulmonary edema as noted on chest x-ray. -Echocardiogram is pending, admit to telemetry. -Cardiology consulted. Start on Lasix 40 mg IV twice a day -Watch renal function due to acute renal failure on CK D -Strict I's and O's, daily weight checks, low-sodium diet -Continue 2 L oxygen. Also with recent bronchitis and scattered wheezing on exam, we'll continue inhalers and DuoNeb's  3.  Atrial fibrillation with rapid ventricular response-likely triggered by hypoxia -Monitor on telemetry, started on oral metoprolol since heart rate is around 100-110 range only. If needed will give when necessary Cardizem. -Echocardiogram is pending. -Cardiology consult is pending -Also known  history of atrial myxoma.  4.  Elevated bilirubin-third bilirubin ordered. Rule out hemolysis. -Reticulocyte count pending. Seems like hemoglobin has been steadily increasing since October. Hematology consult is pending. -Ultrasound of the abdomen with liver cirrhosis changes but no acute obstruction. No other LFTs are elevated.  5.  Chronic transfusion dependent anemia-with history of sickle cell disease -Since hemoglobin low at 6, we'll transfuse with 1 unit packed RBC. Bilirubin is elevated-direct bilirubin levels ordered. -Reticulocyte count is pending. -We'll get hematology consult  6.  Hypertension-will be on metoprolol. Hold losartan with her acute renal failure. -Also on Lasix.   Schnier, Dolores Lory, MD  06/14/2015 5:32 PM

## 2015-06-14 NOTE — Progress Notes (Signed)
Hemodialysis start 

## 2015-06-14 NOTE — Progress Notes (Addendum)
Spoke with dr. Leslye Peer to make aware patient is in more pain today, already given prn dose of hydrocodone. Patients bun and creat worse today. Patient more sob and grunting. Per md order morphine 2mg  iv once. No other orders received. Will continue to monitor . Also paged dr. Candiss Norse to make aware patients bun and creat are worsening along with sob.

## 2015-06-14 NOTE — Progress Notes (Signed)
RN contacted Dr Candiss Norse to clarify about discontinuation of lasix gtt since pt had start dialysis. Orders given to discontinue lasix gtt, and plan for dialysis treatment again tomorrow. Will continue to monitor.   Jane Short

## 2015-06-14 NOTE — Progress Notes (Signed)
Dr. Leslye Peer on the floor per md will speak with dr. Candiss Norse regarding patient and plan of care.

## 2015-06-14 NOTE — Progress Notes (Signed)
Pre-hemodialysis 

## 2015-06-14 NOTE — Progress Notes (Signed)
Patient ID: Jane Short, female   DOB: 05/13/1951, 65 y.o.   MRN: [id number] [facility] PROGRESS NOTE  PCP: [name], MD  HPI/Subjective: Patient still with abdominal swelling, and worsening pain today in the upper abdomen. She is having some shortness of breath.  Objective: Filed Vitals:   06/14/15 1116 06/14/15 1533  BP: 132/76 113/78  Pulse: 89 83  Temp: 97.4 F (36.3 C) 97.9 F (36.6 C)  Resp: 18 16    Filed Weights   06/05/15 0329 06/05/15 1001  Weight: 92.534 kg (204 lb) 91.899 kg (202 lb 9.6 oz)    ROS: Review of Systems  Constitutional: Negative for fever and chills.  Eyes: Negative for blurred vision.  Respiratory: Positive for shortness of breath. Negative for cough.   Cardiovascular: Negative for chest pain.  Gastrointestinal: Positive for abdominal pain. Negative for nausea, vomiting, diarrhea and constipation.  Genitourinary: Negative for dysuria.  Musculoskeletal: Negative for joint pain.  Neurological: Negative for dizziness and headaches.   Exam: Physical Exam  Constitutional: She is oriented to person, place, and time.  HENT:  Nose: No mucosal edema.  Mouth/Throat: No oropharyngeal exudate or posterior oropharyngeal edema.  Eyes: Conjunctivae, EOM and lids are normal. Pupils are equal, round, and reactive to light. Scleral icterus is present.  Neck: No JVD present. Carotid bruit is not present. No edema present. No thyroid mass and no thyromegaly present.  Cardiovascular: S1 normal and S2 normal.  Exam reveals no gallop.   Murmur heard.  Systolic murmur is present with a grade of 2/6  Pulses:      Dorsalis pedis pulses are 2+ on the right side, and 2+ on the left side.  Respiratory: No respiratory distress. She has decreased breath sounds in the right lower field and the left lower field. She has no wheezes. She has no rhonchi. She has no rales.  GI: Soft. Bowel sounds are normal. There is no tenderness.   Musculoskeletal:       Right ankle: She exhibits no swelling.       Left ankle: She exhibits no swelling.  Lymphadenopathy:    She has no cervical adenopathy.  Neurological: She is alert and oriented to person, place, and time. No cranial nerve deficit.  Skin: Skin is warm. No rash noted. Nails show no clubbing.  Jaundiced  Psychiatric: She has a normal mood and affect.     Data Reviewed: Basic Metabolic Panel:  Recent Labs Lab 06/09/15 1517 06/10/15 1315 06/12/15 1136 06/13/15 0455 06/14/15 0507  NA 134* 137 135 134* 135  K 5.9* 3.6 4.9 5.4* 5.9*  CL 99* 111 99* 101 100*  CO2 25 20* 23 24 25   GLUCOSE 142* 129* 112* 133* 113*  BUN 83* 71* 110* 111* 116*  CREATININE 3.26* 2.06* 3.07* 3.19* 3.23*  CALCIUM 9.5 7.2* 9.9 9.7 10.3   Liver Function Tests:  Recent Labs Lab 06/13/15 0455  AST 38  ALT 28  ALKPHOS 124  BILITOT 15.5*  PROT 6.8  ALBUMIN 2.9*   CBC:  Recent Labs Lab 06/13/15 0559  WBC 5.4  HGB 6.8*  HCT 19.7*  MCV 92.5  PLT 199   BNP (last 3 results)  Recent Labs  06/05/15 0407  BNP 683.0*    CBG:  Recent Labs Lab 06/10/15 1651  GLUCAP 175*      Scheduled Meds: . [MAR Hold] allopurinol  100 mg Oral Daily  . [MAR Hold] antiseptic oral rinse  7 mL Mouth Rinse BID  . [  MAR Hold] calcitRIOL  0.25 mcg Oral Daily  . [MAR Hold] collagenase  1 application Topical Daily  . [MAR Hold] cyclobenzaprine  5 mg Oral QHS  . [MAR Hold] folic acid  1 mg Oral Daily  . [MAR Hold] heparin lock flush  500 Units Intravenous Once  . [MAR Hold] hydroxyurea  500 mg Oral Daily  . [MAR Hold] ipratropium-albuterol  3 mL Nebulization Q6H  . [MAR Hold] metoprolol tartrate  25 mg Oral 3 times per day  . [MAR Hold] mometasone-formoterol  2 puff Inhalation BID  . [MAR Hold] polyethylene glycol  17 g Oral BID  . [MAR Hold] predniSONE  50 mg Oral Q breakfast  . [MAR Hold] rosuvastatin  5 mg Oral Daily  . [MAR Hold] sodium chloride  10 mL Intracatheter Q12H  .  [MAR Hold] sodium chloride  3 mL Intravenous Q12H   Continuous Infusions: . sodium chloride Stopped (06/12/15 1853)  . furosemide (LASIX) infusion 4 mg/hr (06/13/15 2002)    Assessment/Plan:  1. Abdominal distention and swelling, likely with cirrhosis- nephrology to start dialysis today to help manage fluid. 2. Acute renal failure on chronic kidney disease with hyperkalemia-patient on Lasix drip. Lasix drip to be stopped once dialysis starts. Dialysis to help out with hyperkalemia and fluid. 3. Sickle cell disease status post 1 unit of transfusion with significant anemia. On hydroxyurea. Check hemoglobin in the morning and transfuse if low. 4. Jaundice, ultrasound of the abdomen consistent with cirrhosis. The hepatitis B panel is negative. Still waiting for hepatitis C. AMA elevated at 31.1. Ceruloplasmin elevated at 53.6. Awaiting GI opinion on these. 5. Hyperlipidemia - will hold Crestor 6. Essential hypertension- blood pressure on the lower side 7. History of COPD 8. Atrial fibrillation with rapid ventricular response on admission 9. Echocardiogram shows EF 50-55% with moderate mitral regurgitation and severe tricuspid regurgitation  Code Status:     Code Status Orders        Start     Ordered   06/05/15 1106  Full code   Continuous     06/05/15 1105    Code Status History    Date Active Date Inactive Code Status Order ID Comments User Context   This patient has a current code status but no historical code status.     Disposition Plan: To be determined, husband at the bedside Time spent: 25 minutes  Loletha Grayer  [facility]

## 2015-06-15 LAB — PROTIME-INR
INR: 1.42
PROTHROMBIN TIME: 17.4 s — AB (ref 11.4–15.0)

## 2015-06-15 LAB — BASIC METABOLIC PANEL
ANION GAP: 9 (ref 5–15)
BUN: 94 mg/dL — ABNORMAL HIGH (ref 6–20)
CALCIUM: 9.9 mg/dL (ref 8.9–10.3)
CO2: 28 mmol/L (ref 22–32)
Chloride: 100 mmol/L — ABNORMAL LOW (ref 101–111)
Creatinine, Ser: 2.66 mg/dL — ABNORMAL HIGH (ref 0.44–1.00)
GFR calc Af Amer: 21 mL/min — ABNORMAL LOW (ref >60)
GFR calc non Af Amer: 18 mL/min — ABNORMAL LOW (ref >60)
GLUCOSE: 114 mg/dL — AB (ref 65–99)
Potassium: 5.7 mmol/L — ABNORMAL HIGH (ref 3.5–5.1)
Sodium: 137 mmol/L (ref 135–145)

## 2015-06-15 LAB — HEMOGLOBIN: Hemoglobin: 6.8 g/dL — ABNORMAL LOW (ref 12.0–16.0)

## 2015-06-15 LAB — HEPATIC FUNCTION PANEL
ALK PHOS: 101 U/L (ref 38–126)
ALT: 30 U/L (ref 14–54)
AST: 38 U/L (ref 15–41)
Albumin: 2.9 g/dL — ABNORMAL LOW (ref 3.5–5.0)
BILIRUBIN DIRECT: 9.1 mg/dL — AB (ref 0.1–0.5)
BILIRUBIN TOTAL: 17.5 mg/dL — AB (ref 0.3–1.2)
Indirect Bilirubin: 8.4 mg/dL — ABNORMAL HIGH (ref 0.3–0.9)
Total Protein: 6.6 g/dL (ref 6.5–8.1)

## 2015-06-15 LAB — HEPATITIS C ANTIBODY: HCV Ab: <0.1 s/co ratio (ref 0.0–0.9)

## 2015-06-15 LAB — PREPARE RBC (CROSSMATCH)

## 2015-06-15 MED ORDER — HYDROXYUREA 500 MG PO CAPS
500.0000 mg | ORAL_CAPSULE | Freq: Every day | ORAL | Status: DC
  Administered 2015-06-16 – 2015-06-18 (×2): 500 mg via ORAL
  Filled 2015-06-15 (×5): qty 1

## 2015-06-15 MED ORDER — PHYTONADIONE 5 MG PO TABS
10.0000 mg | ORAL_TABLET | Freq: Every day | ORAL | Status: AC
  Administered 2015-06-15 – 2015-06-17 (×3): 10 mg via ORAL
  Filled 2015-06-15 (×3): qty 2

## 2015-06-15 MED ORDER — IPRATROPIUM-ALBUTEROL 0.5-2.5 (3) MG/3ML IN SOLN
3.0000 mL | Freq: Three times a day (TID) | RESPIRATORY_TRACT | Status: DC
  Administered 2015-06-15 – 2015-06-24 (×24): 3 mL via RESPIRATORY_TRACT
  Filled 2015-06-15 (×27): qty 3

## 2015-06-15 MED ORDER — SODIUM CHLORIDE 0.9 % IV SOLN
Freq: Once | INTRAVENOUS | Status: AC
  Administered 2015-06-15: 15:00:00 via INTRAVENOUS

## 2015-06-15 MED ORDER — FUROSEMIDE 10 MG/ML IJ SOLN
80.0000 mg | Freq: Once | INTRAMUSCULAR | Status: AC
  Administered 2015-06-15: 80 mg via INTRAVENOUS
  Filled 2015-06-15: qty 8

## 2015-06-15 NOTE — Progress Notes (Signed)
Patient has been alert and oriented. Independent in the room. No longer requiring oxygen and states her breathing feels better since dialysis yesterday. Second dialysis treatment today completed successfully, pulling off 2 liters. Unit of blood was also given during treatment. No complaints of pain today. Ulcers on left leg were cleansed this AM and dressing changed. Afib rate controlled on tele. Vitals are stable. Will continue to monitor.

## 2015-06-15 NOTE — Progress Notes (Addendum)
Patient has to wait for dialysis until a few hours from now. Per dialysis nurse, they will still give unit of blood. Hemoglobin is 6.8 and patient's baseline is around 7. Will update patient. Dr. Leslye Peer is aware of plan.

## 2015-06-15 NOTE — Progress Notes (Signed)
Patient ID: Jane Short, female   DOB: 02/10/1951, 65 y.o.   MRN: [id number] [facility] PROGRESS NOTE  PCP: [name], MD  HPI/Subjective: Patient seen earlier. Patient feeling much better after first dialysis treatment yesterday. Offers no complaints today. Still with a little soreness in the abdomen.  Objective: Filed Vitals:   06/15/15 0653 06/15/15 0654  BP: 125/87 125/87  Pulse: 95 89  Temp:  98.1 F (36.7 C)  Resp:  18    Filed Weights   06/14/15 1835 06/14/15 2045 06/14/15 2134  Weight: 94.3 kg (207 lb 14.3 oz) 91 kg (200 lb 9.9 oz) 92.987 kg (205 lb)    ROS: Review of Systems  Constitutional: Negative for fever and chills.  Eyes: Negative for blurred vision.  Respiratory: Negative for cough and shortness of breath.   Cardiovascular: Negative for chest pain.  Gastrointestinal: Positive for abdominal pain. Negative for nausea, vomiting, diarrhea and constipation.  Genitourinary: Negative for dysuria.  Musculoskeletal: Negative for joint pain.  Neurological: Negative for dizziness and headaches.   Exam: Physical Exam  Constitutional: She is oriented to person, place, and time.  HENT:  Nose: No mucosal edema.  Mouth/Throat: No oropharyngeal exudate or posterior oropharyngeal edema.  Eyes: Conjunctivae, EOM and lids are normal. Pupils are equal, round, and reactive to light. Scleral icterus is present.  Neck: No JVD present. Carotid bruit is not present. No edema present. No thyroid mass and no thyromegaly present.  Cardiovascular: S1 normal and S2 normal.  Exam reveals no gallop.   Murmur heard.  Systolic murmur is present with a grade of 2/6  Pulses:      Dorsalis pedis pulses are 2+ on the right side, and 2+ on the left side.  Respiratory: No respiratory distress. She has decreased breath sounds in the right lower field and the left lower field. She has no wheezes. She has no rhonchi. She has no rales.  GI: Soft. Bowel sounds  are normal. There is no tenderness.  Musculoskeletal:       Right ankle: She exhibits no swelling.       Left ankle: She exhibits no swelling.  Lymphadenopathy:    She has no cervical adenopathy.  Neurological: She is alert and oriented to person, place, and time. No cranial nerve deficit.  Skin: Skin is warm. No rash noted. Nails show no clubbing.  Jaundiced  Psychiatric: She has a normal mood and affect.     Data Reviewed: Basic Metabolic Panel:  Recent Labs Lab 06/10/15 1315 06/12/15 1136 06/13/15 0455 06/14/15 0507 06/15/15 0458  NA 137 135 134* 135 137  K 3.6 4.9 5.4* 5.9* 5.7*  CL 111 99* 101 100* 100*  CO2 20* 23 24 25 28   GLUCOSE 129* 112* 133* 113* 114*  BUN 71* 110* 111* 116* 94*  CREATININE 2.06* 3.07* 3.19* 3.23* 2.66*  CALCIUM 7.2* 9.9 9.7 10.3 9.9   Liver Function Tests:  Recent Labs Lab 06/13/15 0455 06/15/15 0458  AST 38 38  ALT 28 30  ALKPHOS 124 101  BILITOT 15.5* 17.5*  PROT 6.8 6.6  ALBUMIN 2.9* 2.9*   CBC:  Recent Labs Lab 06/13/15 0559 06/15/15 0458  WBC 5.4  --   HGB 6.8* 6.8*  HCT 19.7*  --   MCV 92.5  --   PLT 199  --    BNP (last 3 results)  Recent Labs  06/05/15 0407  BNP 683.0*    CBG:  Recent Labs Lab 06/10/15 1651  GLUCAP 175*  Scheduled Meds: . sodium chloride   Intravenous Once  . allopurinol  100 mg Oral Daily  . antiseptic oral rinse  7 mL Mouth Rinse BID  . calcitRIOL  0.25 mcg Oral Daily  . collagenase  1 application Topical Daily  . cyclobenzaprine  5 mg Oral QHS  . folic acid  1 mg Oral Daily  . furosemide  80 mg Intravenous Once  . heparin lock flush  500 Units Intravenous Once  . hydroxyurea  500 mg Oral Daily  . ipratropium-albuterol  3 mL Nebulization Q6H  . metoprolol tartrate  25 mg Oral 3 times per day  . mometasone-formoterol  2 puff Inhalation BID  . phytonadione  10 mg Oral Daily  . polyethylene glycol  17 g Oral BID  . predniSONE  50 mg Oral Q breakfast  . sodium chloride   10 mL Intracatheter Q12H  . sodium chloride  3 mL Intravenous Q12H   Continuous Infusions: . sodium chloride Stopped (06/12/15 1853)    Assessment/Plan:  1. Abdominal distention and swelling, shortness of breath.  likely with cirrhosis- dialysis helped manage fluid and patient feeling much better today. Dialysis may be the only way to manage fluid with her liver and kidney failure. Patient did not respond to the Lasix drip. Not a great candidate for Aldactone because of hyperkalemia and chronic kidney disease 2. Acute renal failure on chronic kidney disease with hyperkalemia- started on dialysis yesterday and will be dialyzed again today 3. Sickle cell disease status post 1 unit of transfusion with significant anemia. On hydroxyurea. Transfuse another unit of blood today. 4. Jaundice, ultrasound of the abdomen consistent with cirrhosis. The hepatitis B and hepatitis C are negative. AMA elevated at 31.1. Ceruloplasmin elevated at 53.6. Patient may end up needing a liver biopsy. We'll give 3 days of oral vitamin K. 5. Hyperlipidemia - will hold Crestor 6. Essential hypertension- blood pressure on the lower side 7. History of COPD 8. Atrial fibrillation with rapid ventricular response on admission 9. Echocardiogram shows EF 50-55% with moderate mitral regurgitation and severe tricuspid regurgitation  Code Status:     Code Status Orders        Start     Ordered   06/05/15 1106  Full code   Continuous     06/05/15 1105    Code Status History    Date Active Date Inactive Code Status Order ID Comments User Context   This patient has a current code status but no historical code status.     Disposition Plan: To be determined, husband at the bedside Time spent: 25 minutes  Loletha Grayer  [facility]

## 2015-06-15 NOTE — Progress Notes (Signed)
Pre-hd tx 

## 2015-06-15 NOTE — Progress Notes (Signed)
Patient will be getting a unit of blood today and is also scheduled for dialysis. Called Tonya in dialysis and she stated that she is waiting on the nephrologist to put in dialysis orders for sure. Dialysis department can run unit of blood during treatment. Will notify Tonya when unit is ready. Awaiting blood bank to prepare.

## 2015-06-15 NOTE — Progress Notes (Signed)
[facility]  ROUNDING NOTE   Subjective:   Patient underwent first treatment of dialysis yesterday Her clinical condition appears to have dramatically improved today She feels overall better 1000 cc was removed yesterday   Objective:  Vital signs in last 24 hours:  Temp:  [97.5 F (36.4 C)-98.1 F (36.7 C)] 98.1 F (36.7 C) (01/14 0654) Pulse Rate:  [64-96] 89 (01/14 0654) Resp:  [12-18] 18 (01/14 0654) BP: (112-136)/(63-87) 125/87 mmHg (01/14 0654) SpO2:  [95 %-100 %] 95 % (01/14 0654) FiO2 (%):  [98 %] 98 % (01/13 1813) Weight:  [91 kg (200 lb 9.9 oz)-94.3 kg (207 lb 14.3 oz)] 92.987 kg (205 lb) (01/13 2134)  Weight change:  Filed Weights   06/14/15 1835 06/14/15 2045 06/14/15 2134  Weight: 94.3 kg (207 lb 14.3 oz) 91 kg (200 lb 9.9 oz) 92.987 kg (205 lb)    Intake/Output: I/O last 3 completed shifts: In: 90.1 [I.V.:90.1] Out: 2575 [Urine:1575; Other:1000]   Intake/Output this shift:  Total I/O In: 123 [P.O.:120; I.V.:3] Out: -   Physical Exam: General: NAD  Head: Normocephalic, atraumatic. Moist oral mucosal membranes  Eyes: scleral icterus  Neck: Supple, trachea midline  Lungs:  Basilar crackles  Heart: Irregular, 3/6 murmur  Abdomen:  Soft, nontender, +bowel sounds, distended  Extremities:  anasarca   Neurologic:  lethargic but able to answer questions  Skin: Ulcerated lesions bilateral lower extremities    left IJ PermCath     Basic Metabolic Panel:  Recent Labs Lab 06/10/15 1315 06/12/15 1136 06/13/15 0455 06/14/15 0507 06/15/15 0458  NA 137 135 134* 135 137  K 3.6 4.9 5.4* 5.9* 5.7*  CL 111 99* 101 100* 100*  CO2 20* 23 24 25 28   GLUCOSE 129* 112* 133* 113* 114*  BUN 71* 110* 111* 116* 94*  CREATININE 2.06* 3.07* 3.19* 3.23* 2.66*  CALCIUM 7.2* 9.9 9.7 10.3 9.9    Liver Function Tests:  Recent Labs Lab 06/13/15 0455 06/15/15 0458  AST 38 38  ALT 28 30  ALKPHOS 124 101  BILITOT 15.5* 17.5*  PROT 6.8 6.6  ALBUMIN  2.9* 2.9*   No results for input(s): LIPASE, AMYLASE in the last 168 hours. No results for input(s): AMMONIA in the last 168 hours.  CBC:  Recent Labs Lab 06/13/15 0559 06/15/15 0458  WBC 5.4  --   HGB 6.8* 6.8*  HCT 19.7*  --   MCV 92.5  --   PLT 199  --     Cardiac Enzymes: No results for input(s): CKTOTAL, CKMB, CKMBINDEX, TROPONINI in the last 168 hours.  BNP: Invalid input(s): POCBNP  CBG:  Recent Labs Lab 06/10/15 1651  GLUCAP 175*    Microbiology: Results for orders placed or performed in visit on 05/02/12  Urine culture     Status: None   Collection Time: 05/02/12  4:31 PM  Result Value Ref Range Status   Micro Text Report   Final       SOURCE: CLEAN CATCH    COMMENT                   NO GROWTH IN 36 HOURS   ANTIBIOTIC                                                        Coagulation Studies:  Recent Labs  06/13/15 0455 06/14/15 1557 06/15/15 0458  LABPROT 17.0* 17.3* 17.4*  INR 1.37 1.40 1.42    Urinalysis: No results for input(s): COLORURINE, LABSPEC, PHURINE, GLUCOSEU, HGBUR, BILIRUBINUR, KETONESUR, PROTEINUR, UROBILINOGEN, NITRITE, LEUKOCYTESUR in the last 72 hours.  Invalid input(s): APPERANCEUR    Imaging: Dg Fluoro Guide Cv Line Right  06/13/2015  CLINICAL DATA:  Clinical suspicion of indwelling Port-A-Cath occlusion. EXAM: CONTRAST INJECTION OF PORT A CATH UNDER FLUOROSCOPY CONTRAST:  8 mL Omnipaque 300 FLUOROSCOPY TIME:  12 seconds PROCEDURE: Contrast was administered via the indwelling port after it was accessed. Fluoroscopic spot images were obtained of the catheter during injection. FINDINGS: Initial fluoroscopy demonstrates some redundancy of the tunneled port catheter in the subcutaneous tissues with partial loop formation at the base of the jugular vein. The catheter tip lies in the expected position of the brachiocephalic venous confluence. The medial of 2 port lumens was accessed. With aspiration, there was free return of  blood. Contrast injection shows normal patency with free flow of contrast beyond the tip of the catheter into the central veins. There is no evidence of contrast extravasation. No significant fibrin sheath identified. The patient was extremely agitated and could barely tolerate the single lumen injection. The lateral lumen was not accessed for a separate injection procedure. After contrast injection, the port was flushed with heparinized saline. IMPRESSION: There is some retraction of the port catheter tip to the level of the brachiocephalic venous confluence due to partial looping of the catheter at the base of the jugular vein and some redundancy of the tunneled portion of the catheter in the soft tissues. The medial lumen was accessed with free return of blood with aspiration. There was excellent flow of contrast material in this lumen and into the central veins without evidence of obstruction or significant fibrin sheath. Electronically Signed   By: Aletta Edouard M.D.   On: 06/13/2015 15:16     Medications:   . sodium chloride Stopped (06/12/15 1853)   . sodium chloride   Intravenous Once  . allopurinol  100 mg Oral Daily  . antiseptic oral rinse  7 mL Mouth Rinse BID  . calcitRIOL  0.25 mcg Oral Daily  . collagenase  1 application Topical Daily  . cyclobenzaprine  5 mg Oral QHS  . folic acid  1 mg Oral Daily  . furosemide  80 mg Intravenous Once  . heparin lock flush  500 Units Intravenous Once  . hydroxyurea  500 mg Oral Daily  . ipratropium-albuterol  3 mL Nebulization Q6H  . metoprolol tartrate  25 mg Oral 3 times per day  . mometasone-formoterol  2 puff Inhalation BID  . polyethylene glycol  17 g Oral BID  . predniSONE  50 mg Oral Q breakfast  . sodium chloride  10 mL Intracatheter Q12H  . sodium chloride  3 mL Intravenous Q12H     Assessment/ Plan:  Jane Short is a 65 y.o. 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 acute renal failure due to severe anemia, hypoxia and prerenal azotemia from poor PO intake and vomiting.  Potassium elevated: kayexalate, calcium gluconate and furosemide ordered Chronic Kidney Disease is secondary to NSAID use  and sickle cell anemia.  - Renally dose all agents. Avoid nephrotoxic agents.  - not on an ACE-I/ARB as outpatient due to hyperkalemia.  - increase in serum creatinine is likely due to intravascular volume changes - Patient appears to have benefited from dialysis. First treatment January 13. Left IJ PermCath placed by Dr. Delana Meyer on Jan 13   Plan to dialyze today and try to remove 2000 cc  - We'll consider dialysis tomorrow also  - If urine output and renal function does not improve in the next few days, may need chronic maintenance dialysis   2. Hyperkalemia - Expected to correct with hemodialysis   3. Hypertension: blood pressure at goal. With atrial fibrillation and rapid ventricular response on admission.  - metoprolol for rate control.    4. Secondary Hyperparathyroidism: PTH 250. Calcium at goal during admission.  - Continue calcitriol   5.  ascites and anasarca -  low-dose Lasix infusion and low-dose spironolactone (will d/c due to high K) - 2-D echo shows EF 50-55%, moderate mitral regurgitation, severe tricuspid regurgitation  6. 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 Hemoglobin back to her baseline at 7. - transfusion and erythropoietin as per hematology/IM - hydroxyurea as outpatient.    LOS: 10 Timtohy Broski 1/14/201712:15 PM

## 2015-06-15 NOTE — Progress Notes (Signed)
Hemodialysis start 

## 2015-06-16 LAB — COMPREHENSIVE METABOLIC PANEL
ALK PHOS: 107 U/L (ref 38–126)
ALT: 29 U/L (ref 14–54)
ANION GAP: 10 (ref 5–15)
AST: 30 U/L (ref 15–41)
Albumin: 2.8 g/dL — ABNORMAL LOW (ref 3.5–5.0)
BUN: 71 mg/dL — ABNORMAL HIGH (ref 6–20)
CALCIUM: 9.5 mg/dL (ref 8.9–10.3)
CO2: 27 mmol/L (ref 22–32)
Chloride: 99 mmol/L — ABNORMAL LOW (ref 101–111)
Creatinine, Ser: 2.28 mg/dL — ABNORMAL HIGH (ref 0.44–1.00)
GFR, EST AFRICAN AMERICAN: 25 mL/min — AB (ref >60)
GFR, EST NON AFRICAN AMERICAN: 22 mL/min — AB (ref >60)
Glucose, Bld: 145 mg/dL — ABNORMAL HIGH (ref 65–99)
Potassium: 4.6 mmol/L (ref 3.5–5.1)
SODIUM: 136 mmol/L (ref 135–145)
TOTAL PROTEIN: 6.3 g/dL — AB (ref 6.5–8.1)
Total Bilirubin: 16.1 mg/dL — ABNORMAL HIGH (ref 0.3–1.2)

## 2015-06-16 LAB — TYPE AND SCREEN
ABO/RH(D): O POS
Antibody Screen: NEGATIVE
Unit division: 0

## 2015-06-16 LAB — CBC
HCT: 23 % — ABNORMAL LOW (ref 35.0–47.0)
HEMOGLOBIN: 7.7 g/dL — AB (ref 12.0–16.0)
MCH: 31.9 pg (ref 26.0–34.0)
MCHC: 33.6 g/dL (ref 32.0–36.0)
MCV: 95 fL (ref 80.0–100.0)
Platelets: 186 10*3/uL (ref 150–440)
RBC: 2.42 MIL/uL — ABNORMAL LOW (ref 3.80–5.20)
RDW: 20.9 % — ABNORMAL HIGH (ref 11.5–14.5)
WBC: 8 10*3/uL (ref 3.6–11.0)

## 2015-06-16 MED ORDER — SODIUM CHLORIDE 0.9 % IJ SOLN: 3.0000 mL | INTRAMUSCULAR | Status: DC | PRN

## 2015-06-16 MED ORDER — MAGNESIUM SULFATE 2 GM/50ML IV SOLN
2.0000 g | Freq: Once | INTRAVENOUS | Status: AC
  Administered 2015-06-16: 2 g via INTRAVENOUS
  Filled 2015-06-16: qty 50

## 2015-06-16 MED ORDER — METOPROLOL TARTRATE 50 MG PO TABS
50.0000 mg | ORAL_TABLET | Freq: Two times a day (BID) | ORAL | Status: DC
  Administered 2015-06-16 – 2015-06-23 (×12): 50 mg via ORAL
  Filled 2015-06-16 (×14): qty 1

## 2015-06-16 MED ORDER — SODIUM CHLORIDE 0.9 % IV SOLN: INTRAVENOUS | Status: DC | PRN

## 2015-06-16 NOTE — Progress Notes (Signed)
Patient ID: Jane Short, female   DOB: 05/11/1951, 65 y.o.   MRN: [id number] [facility] PROGRESS NOTE  PCP: [name], MD  HPI/Subjective: Patient had an elevated heart rate which was sustained. Patient was sleeping at the time. She's been coughing a lot since her second dialysis. Feels better with regards to her breathing. Her abdominal swelling seems a little bit less.  Objective: Filed Vitals:   06/16/15 1000 06/16/15 1128  BP: 118/73 120/73  Pulse: 78 83  Temp: 97.9 F (36.6 C) 97.8 F (36.6 C)  Resp: 16 18    Filed Weights   06/15/15 1410 06/15/15 1656 06/15/15 1742  Weight: 92.6 kg (204 lb 2.3 oz) 90.4 kg (199 lb 4.7 oz) 87.726 kg (193 lb 6.4 oz)    ROS: Review of Systems  Constitutional: Negative for fever and chills.  Eyes: Negative for blurred vision.  Respiratory: Positive for cough. Negative for shortness of breath.   Cardiovascular: Negative for chest pain.  Gastrointestinal: Positive for abdominal pain. Negative for nausea, vomiting, diarrhea and constipation.  Genitourinary: Negative for dysuria.  Musculoskeletal: Negative for joint pain.  Neurological: Negative for dizziness and headaches.   Exam: Physical Exam  Constitutional: She is oriented to person, place, and time.  HENT:  Nose: No mucosal edema.  Mouth/Throat: No oropharyngeal exudate or posterior oropharyngeal edema.  Eyes: Conjunctivae, EOM and lids are normal. Pupils are equal, round, and reactive to light. Scleral icterus is present.  Neck: No JVD present. Carotid bruit is not present. No edema present. No thyroid mass and no thyromegaly present.  Cardiovascular: S1 normal and S2 normal.  Exam reveals no gallop.   Murmur heard.  Systolic murmur is present with a grade of 2/6  Pulses:      Dorsalis pedis pulses are 2+ on the right side, and 2+ on the left side.  Respiratory: No respiratory distress. She has decreased breath sounds in the right lower field and  the left lower field. She has no wheezes. She has no rhonchi. She has no rales.  GI: Soft. Bowel sounds are normal. There is no tenderness.  Musculoskeletal:       Right ankle: She exhibits no swelling.       Left ankle: She exhibits no swelling.  Lymphadenopathy:    She has no cervical adenopathy.  Neurological: She is alert and oriented to person, place, and time. No cranial nerve deficit.  Skin: Skin is warm. No rash noted. Nails show no clubbing.  Jaundiced  Psychiatric: She has a normal mood and affect.     Data Reviewed: Basic Metabolic Panel:  Recent Labs Lab 06/12/15 1136 06/13/15 0455 06/14/15 0507 06/15/15 0458 06/16/15 0514  NA 135 134* 135 137 136  K 4.9 5.4* 5.9* 5.7* 4.6  CL 99* 101 100* 100* 99*  CO2 23 24 25 28 27   GLUCOSE 112* 133* 113* 114* 145*  BUN 110* 111* 116* 94* 71*  CREATININE 3.07* 3.19* 3.23* 2.66* 2.28*  CALCIUM 9.9 9.7 10.3 9.9 9.5   Liver Function Tests:  Recent Labs Lab 06/13/15 0455 06/15/15 0458 06/16/15 0514  AST 38 38 30  ALT 28 30 29   ALKPHOS 124 101 107  BILITOT 15.5* 17.5* 16.1*  PROT 6.8 6.6 6.3*  ALBUMIN 2.9* 2.9* 2.8*   CBC:  Recent Labs Lab 06/13/15 0559 06/15/15 0458 06/16/15 0514  WBC 5.4  --  8.0  HGB 6.8* 6.8* 7.7*  HCT 19.7*  --  23.0*  MCV 92.5  --  95.0  PLT 199  --  186   BNP (last 3 results)  Recent Labs  06/05/15 0407  BNP 683.0*    CBG:  Recent Labs Lab 06/10/15 1651  GLUCAP 175*      Scheduled Meds: . allopurinol  100 mg Oral Daily  . antiseptic oral rinse  7 mL Mouth Rinse BID  . calcitRIOL  0.25 mcg Oral Daily  . collagenase  1 application Topical Daily  . cyclobenzaprine  5 mg Oral QHS  . folic acid  1 mg Oral Daily  . heparin lock flush  500 Units Intravenous Once  . hydroxyurea  500 mg Oral Daily  . ipratropium-albuterol  3 mL Nebulization TID  . magnesium sulfate 1 - 4 g bolus IVPB  2 g Intravenous Once  . metoprolol tartrate  25 mg Oral 3 times per day  .  mometasone-formoterol  2 puff Inhalation BID  . phytonadione  10 mg Oral Daily  . polyethylene glycol  17 g Oral BID  . predniSONE  50 mg Oral Q breakfast  . sodium chloride  10 mL Intracatheter Q12H  . sodium chloride  3 mL Intravenous Q12H   Continuous Infusions: . sodium chloride Stopped (06/12/15 1853)    Assessment/Plan:  1. Sustained wide-complex arrhythmia which spontaneously broke. Unclear if this is an SVT or a DVT. I gave a dose of IV magnesium. I gave a dose of metoprolol early. Will increase metoprolol to 50 mg twice a day. I asked Dr. Ubaldo Glassing to come by and evaluate the patient.  2. Abdominal distention and swelling, shortness of breath.  likely with cirrhosis- dialysis helped manage fluid. Dialysis may be the only way to manage fluid with her liver and kidney failure. Patient did not respond to the Lasix drip. Not a great candidate for Aldactone because of hyperkalemia and chronic kidney disease 3. Acute renal failure on chronic kidney disease with hyperkalemia- started on dialysis prior to and will be dialyzed again Monday 4. Sickle cell disease status post 2 unit of transfusion with significant anemia. On hydroxyurea.  5. Jaundice, ultrasound of the abdomen consistent with cirrhosis. The hepatitis B and hepatitis C are negative. AMA elevated at 31.1. Ceruloplasmin elevated at 53.6. Patient may end up needing a liver biopsy. We'll give 3 days of oral vitamin K. 6. Hyperlipidemia - will hold Crestor 7. Essential hypertension- blood pressure on the lower side 8. History of COPD 9. Atrial fibrillation with rapid ventricular response on admission 10. Echocardiogram shows EF 50-55% with moderate mitral regurgitation and severe tricuspid regurgitation  Code Status:     Code Status Orders        Start     Ordered   06/05/15 1106  Full code   Continuous     06/05/15 1105    Code Status History    Date Active Date Inactive Code Status Order ID Comments User Context   This  patient has a current code status but no historical code status.     Disposition Plan: To be determined, husband at the bedside Time spent: 25 minutes  Loletha Grayer  [facility]

## 2015-06-16 NOTE — Progress Notes (Signed)
[facility]  ROUNDING NOTE   Subjective:   Patient has completed 2 showed treatments of dialysis in the last 2 days Total of 3 L of fluid has been removed Today's creatinine is 2.28 Total bilirubin remains high at 16.1 Hemoglobin is 7.7 Urine output is not accurately recorded  Objective:  Vital signs in last 24 hours:  Temp:  [97.7 F (36.5 C)-98.4 F (36.9 C)] 97.9 F (36.6 C) (01/15 1000) Pulse Rate:  [72-108] 78 (01/15 1000) Resp:  [16-23] 16 (01/15 1000) BP: (105-138)/(68-92) 118/73 mmHg (01/15 1000) SpO2:  [91 %-96 %] 95 % (01/15 1000) Weight:  [87.726 kg (193 lb 6.4 oz)-92.6 kg (204 lb 2.3 oz)] 87.726 kg (193 lb 6.4 oz) (01/14 1742)  Weight change: -1.7 kg (-3 lb 12 oz) Filed Weights   06/15/15 1410 06/15/15 1656 06/15/15 1742  Weight: 92.6 kg (204 lb 2.3 oz) 90.4 kg (199 lb 4.7 oz) 87.726 kg (193 lb 6.4 oz)    Intake/Output: I/O last 3 completed shifts: In: 463 [P.O.:120; I.V.:3; Blood:340] Out: 3000 [Other:3000]   Intake/Output this shift:  Total I/O In: 120 [P.O.:120] Out: -   Physical Exam: General: NAD  Head: Normocephalic, atraumatic. Moist oral mucosal membranes  Eyes: scleral icterus  Neck: Supple, trachea midline  Lungs:  Mild Basilar crackles, normal respiratory effort   Heart: Irregular, 3/6 murmur  Abdomen:  Soft, nontender, +bowel sounds, distended  Extremities:  anasarca   Neurologic:  alert, oriented,   Skin: Ulcerated lesions bilateral lower extremities    left IJ PermCath     Basic Metabolic Panel:  Recent Labs Lab 06/12/15 1136 06/13/15 0455 06/14/15 0507 06/15/15 0458 06/16/15 0514  NA 135 134* 135 137 136  K 4.9 5.4* 5.9* 5.7* 4.6  CL 99* 101 100* 100* 99*  CO2 23 24 25 28 27   GLUCOSE 112* 133* 113* 114* 145*  BUN 110* 111* 116* 94* 71*  CREATININE 3.07* 3.19* 3.23* 2.66* 2.28*  CALCIUM 9.9 9.7 10.3 9.9 9.5    Liver Function Tests:  Recent Labs Lab 06/13/15 0455 06/15/15 0458 06/16/15 0514  AST 38  38 30  ALT 28 30 29   ALKPHOS 124 101 107  BILITOT 15.5* 17.5* 16.1*  PROT 6.8 6.6 6.3*  ALBUMIN 2.9* 2.9* 2.8*   No results for input(s): LIPASE, AMYLASE in the last 168 hours. No results for input(s): AMMONIA in the last 168 hours.  CBC:  Recent Labs Lab 06/13/15 0559 06/15/15 0458 06/16/15 0514  WBC 5.4  --  8.0  HGB 6.8* 6.8* 7.7*  HCT 19.7*  --  23.0*  MCV 92.5  --  95.0  PLT 199  --  186    Cardiac Enzymes: No results for input(s): CKTOTAL, CKMB, CKMBINDEX, TROPONINI in the last 168 hours.  BNP: Invalid input(s): POCBNP  CBG:  Recent Labs Lab 06/10/15 1651  GLUCAP 175*    Microbiology: Results for orders placed or performed in visit on 05/02/12  Urine culture     Status: None   Collection Time: 05/02/12  4:31 PM  Result Value Ref Range Status   Micro Text Report   Final       SOURCE: CLEAN CATCH    COMMENT                   NO GROWTH IN 36 HOURS   ANTIBIOTIC  Coagulation Studies:  Recent Labs  06/14/15 1557 06/15/15 0458  LABPROT 17.3* 17.4*  INR 1.40 1.42    Urinalysis: No results for input(s): COLORURINE, LABSPEC, PHURINE, GLUCOSEU, HGBUR, BILIRUBINUR, KETONESUR, PROTEINUR, UROBILINOGEN, NITRITE, LEUKOCYTESUR in the last 72 hours.  Invalid input(s): APPERANCEUR    Imaging: No results found.   Medications:   . sodium chloride Stopped (06/12/15 1853)   . allopurinol  100 mg Oral Daily  . antiseptic oral rinse  7 mL Mouth Rinse BID  . calcitRIOL  0.25 mcg Oral Daily  . collagenase  1 application Topical Daily  . cyclobenzaprine  5 mg Oral QHS  . folic acid  1 mg Oral Daily  . heparin lock flush  500 Units Intravenous Once  . hydroxyurea  500 mg Oral Daily  . ipratropium-albuterol  3 mL Nebulization TID  . metoprolol tartrate  25 mg Oral 3 times per day  . mometasone-formoterol  2 puff Inhalation BID  . phytonadione  10 mg Oral Daily  . polyethylene glycol  17 g Oral BID   . predniSONE  50 mg Oral Q breakfast  . sodium chloride  10 mL Intracatheter Q12H  . sodium chloride  3 mL Intravenous Q12H     Assessment/ Plan:  [name] is a 65 y.o. black female  with sickle cell disease, atrial myxoma who was admitted on 06/05/2015 for Acute pulmonary edema ([place]) [J81.0] Hypoxia [R09.02] Atrial fibrillation with rapid ventricular response ([place]) [I48.91] Elevated bilirubin [R17] Abdominal pain [R10.9]  1. 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 acute renal failure due to severe anemia, hypoxia and prerenal azotemia from poor PO intake and vomiting.  Chronic Kidney Disease is secondary to NSAID use and sickle cell anemia.  - Renally dose all agents. Avoid nephrotoxic agents.  - not on an ACE-I/ARB as outpatient due to hyperkalemia.  - increase in serum creatinine is likely due to intravascular volume changes - Patient appears to have benefited from dialysis. First treatment January 13. Left IJ PermCath placed by Dr. Delana Meyer on Jan 13  - 2000 cc removed during second dialysis treatment  Dialysis currently on hold. Depending on labs and volume status, reassess daily for need of further dialysis - If urine output and renal function does not improve in the next few days, may need chronic maintenance dialysis   [facility] Weights   06/15/15 1410 06/15/15 1656 06/15/15 1742  Weight: 92.6 kg (204 lb 2.3 oz) 90.4 kg (199 lb 4.7 oz) 87.726 kg (193 lb 6.4 oz)     2. Hyperkalemia - Corrected with hemodialysis   3. Hypertension: blood pressure at goal. With atrial fibrillation and rapid ventricular response on admission.  - metoprolol for rate control.    4. Secondary Hyperparathyroidism: PTH 250. Calcium at goal during admission.  - Continue calcitriol   5.  ascites and anasarca -  low-dose Lasix infusion was ineffective and low-dose spironolactone was discontinued due to  hyperkalemia - 2-D echo shows EF 50-55%, moderate mitral regurgitation, severe tricuspid regurgitation  - Valvular abnormalities make fluid management extremely challenging  6. 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 Hemoglobin back to her baseline at 7. - transfusion and erythropoietin as per hematology/IM - hydroxyurea as outpatient.     LOS: [place] 1/15/201711:14 AM

## 2015-06-16 NOTE — Progress Notes (Signed)
Summoned to pt room for elevated heart rate. 170 beats per minute. Pt found to be sleeping. Roused readily. Denied chest pain and sob. Episode resolved spontaneously in 8-10 minutes. Dr. Leslye Peer notified.

## 2015-06-17 LAB — HEMOCHROMATOSIS DNA-PCR(C282Y,H63D)

## 2015-06-17 LAB — PROTIME-INR
INR: 1.34
Prothrombin Time: 16.7 seconds — ABNORMAL HIGH (ref 11.4–15.0)

## 2015-06-17 LAB — HEPATITIS C ANTIBODY: HCV Ab: <0.1 s/co ratio (ref 0.0–0.9)

## 2015-06-17 NOTE — Progress Notes (Signed)
[place]  SUBJECTIVE: complains of cough and sob but feels a little better since admission. Had episode of sustained wide complex tachycardia which spontaneously resolved. Was asymptomatic as she was asleep at the time. K was 4.6. Received iv magnesium   Filed Vitals:   06/16/15 1128 06/16/15 1918 06/16/15 1946 06/17/15 0444  BP: 120/73 115/70  122/66  Pulse: 83 99  93  Temp: 97.8 F (36.6 C) 98.1 F (36.7 C)  97.9 F (36.6 C)  TempSrc: Oral Oral  Oral  Resp: 18 18  19   Height:      Weight:      SpO2: 94% 92% 92% 92%    Intake/Output Summary (Last 24 hours) at 06/17/15 0829 Last data filed at 06/17/15 [id number]  Gross per 24 hour  Intake    360 ml  Output    150 ml  Net    210 ml    LABS: Basic Metabolic Panel:  Recent Labs  06/15/15 0458 06/16/15 0514  NA 137 136  K 5.7* 4.6  CL 100* 99*  CO2 28 27  GLUCOSE 114* 145*  BUN 94* 71*  CREATININE 2.66* 2.28*  CALCIUM 9.9 9.5   Liver Function Tests:  Recent Labs  06/15/15 0458 06/16/15 0514  AST 38 30  ALT 30 29  ALKPHOS 101 107  BILITOT 17.5* 16.1*  PROT 6.6 6.3*  ALBUMIN 2.9* 2.8*   No results for input(s): LIPASE, AMYLASE in the last 72 hours. CBC:  Recent Labs  06/15/15 0458 06/16/15 0514  WBC  --  8.0  HGB 6.8* 7.7*  HCT  --  23.0*  MCV  --  95.0  PLT  --  186   Cardiac Enzymes: No results for input(s): CKTOTAL, CKMB, CKMBINDEX, TROPONINI in the last 72 hours. BNP: Invalid input(s): POCBNP D-Dimer: No results for input(s): DDIMER in the last 72 hours. Hemoglobin A1C: No results for input(s): HGBA1C in the last 72 hours. Fasting Lipid Panel: No results for input(s): CHOL, HDL, LDLCALC, TRIG, CHOLHDL, LDLDIRECT in the last 72 hours. Thyroid Function Tests: No results for input(s): TSH, T4TOTAL, T3FREE, THYROIDAB in the last 72 hours.  Invalid input(s): FREET3 Anemia Panel: No results for input(s): VITAMINB12, FOLATE, FERRITIN, TIBC, IRON, RETICCTPCT  in the last 72 hours.   Physical Exam: Blood pressure 122/66, pulse 93, temperature 97.9 F (36.6 C), temperature source Oral, resp. rate 19, height 5\' 7"  (1.702 m), weight 87.726 kg (193 lb 6.4 oz), SpO2 92 %.    General appearance: cooperative Resp: clear to auscultation bilaterally Cardio: irregularly irregular rhythm GI: soft, non-tender; bowel sounds normal; no masses,  no organomegaly Neurologic: Grossly normal  TELEMETRY: Reviewed telemetry pt in afib with controlled vr: Review of strips from yesterday afternoon reveal wide complex tachycardia. VT vs abearant afib.   ASSESSMENT AND PLAN:  Active Problems:   CHF (congestive heart failure) (HCC)-improved with hd.    Elevated bilirubin   Sickle-cell/Hb-C disease with crisis ([place])  afib-rate is controlled at present. Will continue with metoprolol and follow electrolytes. No further wide complex tachycardia at present.   Teodoro Spray., MD, [facility] 06/17/2015 8:29 AM

## 2015-06-17 NOTE — Progress Notes (Signed)
Patient ID: Jane Short, female   DOB: [birth year], 65 y.o.   MRN: [id number] [facility] PROGRESS NOTE  PCP: [name], MD  HPI/Subjective: Patient feels okay. Some shortness of breath. Cough improved. Abdominal distension still present.  Objective: Filed Vitals:   06/17/15 0444 06/17/15 1136  BP: 122/66 116/75  Pulse: 93 98  Temp: 97.9 F (36.6 C) 98.5 F (36.9 C)  Resp: 19 18    Filed Weights   06/15/15 1410 06/15/15 1656 06/15/15 1742  Weight: 92.6 kg (204 lb 2.3 oz) 90.4 kg (199 lb 4.7 oz) 87.726 kg (193 lb 6.4 oz)    ROS: Review of Systems  Constitutional: Negative for fever and chills.  Eyes: Negative for blurred vision.  Respiratory: Positive for shortness of breath. Negative for cough.   Cardiovascular: Negative for chest pain.  Gastrointestinal: Positive for abdominal pain. Negative for nausea, vomiting, diarrhea and constipation.  Genitourinary: Negative for dysuria.  Musculoskeletal: Negative for joint pain.  Neurological: Negative for dizziness and headaches.   Exam: Physical Exam  Constitutional: She is oriented to person, place, and time.  HENT:  Nose: No mucosal edema.  Mouth/Throat: No oropharyngeal exudate or posterior oropharyngeal edema.  Eyes: Conjunctivae, EOM and lids are normal. Pupils are equal, round, and reactive to light. Scleral icterus is present.  Neck: No JVD present. Carotid bruit is not present. No edema present. No thyroid mass and no thyromegaly present.  Cardiovascular: S1 normal and S2 normal.  Exam reveals no gallop.   Murmur heard.  Systolic murmur is present with a grade of 2/6  Pulses:      Dorsalis pedis pulses are 2+ on the right side, and 2+ on the left side.  Respiratory: No respiratory distress. She has decreased breath sounds in the right lower field and the left lower field. She has no wheezes. She has no rhonchi. She has no rales.  GI: Soft. Bowel sounds are normal. There is no  tenderness.  Musculoskeletal:       Right ankle: She exhibits no swelling.       Left ankle: She exhibits no swelling.  Lymphadenopathy:    She has no cervical adenopathy.  Neurological: She is alert and oriented to person, place, and time. No cranial nerve deficit.  Skin: Skin is warm. No rash noted. Nails show no clubbing.  Jaundiced  Psychiatric: She has a normal mood and affect.     Data Reviewed: Basic Metabolic Panel:  Recent Labs Lab 06/12/15 1136 06/13/15 0455 06/14/15 0507 06/15/15 0458 06/16/15 0514  NA 135 134* 135 137 136  K 4.9 5.4* 5.9* 5.7* 4.6  CL 99* 101 100* 100* 99*  CO2 23 24 25 28 27   GLUCOSE 112* 133* 113* 114* 145*  BUN 110* 111* 116* 94* 71*  CREATININE 3.07* 3.19* 3.23* 2.66* 2.28*  CALCIUM 9.9 9.7 10.3 9.9 9.5   Liver Function Tests:  Recent Labs Lab 06/13/15 0455 06/15/15 0458 06/16/15 0514  AST 38 38 30  ALT 28 30 29   ALKPHOS 124 101 107  BILITOT 15.5* 17.5* 16.1*  PROT 6.8 6.6 6.3*  ALBUMIN 2.9* 2.9* 2.8*   CBC:  Recent Labs Lab 06/13/15 0559 06/15/15 0458 06/16/15 0514  WBC 5.4  --  8.0  HGB 6.8* 6.8* 7.7*  HCT 19.7*  --  23.0*  MCV 92.5  --  95.0  PLT 199  --  186   BNP (last 3 results)  Recent Labs  06/05/15 0407  BNP 683.0*  CBG:  Recent Labs Lab 06/10/15 1651  GLUCAP 175*      Scheduled Meds: . allopurinol  100 mg Oral Daily  . calcitRIOL  0.25 mcg Oral Daily  . collagenase  1 application Topical Daily  . cyclobenzaprine  5 mg Oral QHS  . folic acid  1 mg Oral Daily  . heparin lock flush  500 Units Intravenous Once  . hydroxyurea  500 mg Oral Daily  . ipratropium-albuterol  3 mL Nebulization TID  . metoprolol tartrate  50 mg Oral BID  . mometasone-formoterol  2 puff Inhalation BID  . polyethylene glycol  17 g Oral BID  . predniSONE  50 mg Oral Q breakfast    Assessment/Plan:  1. Sustained wide-complex arrhythmia which spontaneously broke. On metoprolol to 50 mg twice a day. No further  arrythmia 2. Abdominal distention and swelling, shortness of breath.  likely with cirrhosis- dialysis helped manage fluid. Dialysis may be the only way to manage fluid with her liver and kidney failure. Dialysis as per nephrology 3. Acute renal failure on chronic kidney disease with hyperkalemia- dialysis as per nephrology 4. Sickle cell disease status post 2 unit of transfusion with significant anemia. On hydroxyurea.  5. Jaundice, ultrasound of the abdomen consistent with cirrhosis. The hepatitis B and hepatitis C are negative. AMA elevated at 31.1. Ceruloplasmin elevated at 53.6. We'll give 3 days of oral vitamin K. Paging Dr. Candace Cruise to see if we should proceed with liver biopsy 6. Hyperlipidemia - will hold Crestor 7. Essential hypertension- blood pressure on the lower side 8. History of COPD 9. Atrial fibrillation with rapid ventricular response on admission 10. Echocardiogram shows EF 50-55% with moderate mitral regurgitation and severe tricuspid regurgitation  Code Status:     Code Status Orders        Start     Ordered   06/05/15 1106  Full code   Continuous     06/05/15 1105    Code Status History    Date Active Date Inactive Code Status Order ID Comments User Context   This patient has a current code status but no historical code status.     Disposition Plan: To be determined Time spent: 25 minutes  Loletha Grayer  [facility]

## 2015-06-17 NOTE — Progress Notes (Signed)
SATURATION QUALIFICATIONS: (This note is used to comply with regulatory documentation for home oxygen)  Patient Saturations on Room Air at Rest = 93%  Patient Saturations on Room Air while Ambulating = 85%  Patient Saturations on 1 Liters of oxygen while Ambulating = 95%  Please briefly explain why patient needs home oxygen: Pt needs oxygen with activity secondary to desaturation on RA with ambulation and complaints of shortness of breath with ambulation on room air. These symptoms improved with application of 1L per [place].

## 2015-06-17 NOTE — Care Management Note (Addendum)
Case Management Note  Patient Details  Name: Annice Nibbe MRN: [id number] Date of Birth: 06-26-1950  Subjective/Objective:    65yo Ms Jajaira Bohlman was admitted 06/05/15 with a diagnosis of acute pulmonary edema. Hx COPD, Renal and liver failure, sickle cell, acute atrial fib triggered by hypoxia. PCP=Jennifer Walker. Pharmacy=[facility] in [place]. Ms Hagelstein is married and resides with her husband. She has no home assistive equipment and no home oxygen. No home health services.She drives herself to appointments. Ms Gunnell reports that she is waiting to find out from her doctor if she will need to go on dialysis permanently. Case management will follow for discharge planning.               Action/Plan:   Expected Discharge Date:                  Expected Discharge Plan:     In-House Referral:     Discharge planning Services     Post Acute Care Choice:    Choice offered to:     DME Arranged:    DME Agency:     HH Arranged:    [place] Agency:     Status of Service:     Medicare Important Message Given:    Date Medicare IM Given:    Medicare IM give by:    Date Additional Medicare IM Given:    Additional Medicare Important Message give by:     If discussed at [place] of Stay Meetings, dates discussed:    Additional Comments:  Gil Ingwersen A, RN 06/17/2015, 4:27 PM

## 2015-06-17 NOTE — Progress Notes (Signed)
[facility]  ROUNDING NOTE   Subjective:  Pt seen at bedside.  Feeling better overall. Denies shortness of breath at the moment. Dialysis currently on hold.    Objective:  Vital signs in last 24 hours:  Temp:  [97.9 F (36.6 C)-98.5 F (36.9 C)] 98.5 F (36.9 C) (01/16 1136) Pulse Rate:  [93-99] 98 (01/16 1136) Resp:  [18-19] 18 (01/16 1136) BP: (115-122)/(66-75) 116/75 mmHg (01/16 1136) SpO2:  [92 %-95 %] 95 % (01/16 1136)  Weight change:  Filed Weights   06/15/15 1410 06/15/15 1656 06/15/15 1742  Weight: 92.6 kg (204 lb 2.3 oz) 90.4 kg (199 lb 4.7 oz) 87.726 kg (193 lb 6.4 oz)    Intake/Output: I/O last 3 completed shifts: In: 360 [P.O.:360] Out: -    Intake/Output this shift:  Total I/O In: 240 [P.O.:240] Out: 150 [Urine:150]  Physical Exam: General: NAD  Head: Normocephalic, atraumatic. Moist oral mucosal membranes  Eyes: scleral icterus  Neck: Supple, trachea midline  Lungs:  CTAB, normal respiratory effort   Heart: Irregular, 3/6 murmur  Abdomen:  Soft, nontender, +bowel sounds, distended  Extremities:  anasarca   Neurologic:  alert, oriented,   Skin: Ulcerated lesions bilateral lower extremities    left IJ PermCath     Basic Metabolic Panel:  Recent Labs Lab 06/12/15 1136 06/13/15 0455 06/14/15 0507 06/15/15 0458 06/16/15 0514  NA 135 134* 135 137 136  K 4.9 5.4* 5.9* 5.7* 4.6  CL 99* 101 100* 100* 99*  CO2 _0 GLUCOSE 112* 133* 113* 114* 145*  BUN 110* 111* 116* 94* 71*  CREATININE 3.07* 3.19* 3.23* 2.66* 2.28*  CALCIUM 9.9 9.7 10.3 9.9 9.5    Liver Function Tests:  Recent Labs Lab 06/13/15 0455 06/15/15 0458 06/16/15 0514  AST 38 38 30  ALT _1 ALKPHOS 124 101 107  BILITOT 15.5* 17.5* 16.1*  PROT 6.8 6.6 6.3*  ALBUMIN 2.9* 2.9* 2.8*   No results for input(s): LIPASE, AMYLASE in the last 168 hours. No results for input(s): AMMONIA in the last 168 hours.  CBC:  Recent Labs Lab  06/13/15 0559 06/15/15 0458 06/16/15 0514  WBC 5.4  --  8.0  HGB 6.8* 6.8* 7.7*  HCT 19.7*  --  23.0*  MCV 92.5  --  95.0  PLT 199  --  186    Cardiac Enzymes: No results for input(s): CKTOTAL, CKMB, CKMBINDEX, TROPONINI in the last 168 hours.  BNP: Invalid input(s): POCBNP  CBG: No results for input(s): GLUCAP in the last 168 hours.  Microbiology: Results for orders placed or performed in visit on 05/02/12  Urine culture     Status: None   Collection Time: 05/02/12  4:31 PM  Result Value Ref Range Status   Micro Text Report   Final       SOURCE: CLEAN CATCH    COMMENT                   NO GROWTH IN 36 HOURS   ANTIBIOTIC                                                        Coagulation Studies:  Recent Labs  06/15/15 0458 06/17/15 1408  LABPROT 17.4* 16.7*  INR 1.42 1.34    Urinalysis:  No results for input(s): COLORURINE, LABSPEC, [place], GLUCOSEU, HGBUR, BILIRUBINUR, KETONESUR, PROTEINUR, UROBILINOGEN, NITRITE, LEUKOCYTESUR in the last 72 hours.  Invalid input(s): APPERANCEUR    Imaging: No results found.   Medications:     . allopurinol  100 mg Oral Daily  . calcitRIOL  0.25 mcg Oral Daily  . collagenase  1 application Topical Daily  . cyclobenzaprine  5 mg Oral QHS  . folic acid  1 mg Oral Daily  . heparin lock flush  500 Units Intravenous Once  . hydroxyurea  500 mg Oral Daily  . ipratropium-albuterol  3 mL Nebulization TID  . metoprolol tartrate  50 mg Oral BID  . mometasone-formoterol  2 puff Inhalation BID  . polyethylene glycol  17 g Oral BID  . predniSONE  50 mg Oral Q breakfast     Assessment/ Plan:  Jane Short is a 65 y.o. black female  with sickle cell disease, atrial myxoma who was admitted on 06/05/2015 for Acute pulmonary edema ([place]) [J81.0] Hypoxia [R09.02] Atrial fibrillation with rapid ventricular response ([place]) [I48.91] Elevated bilirubin [R17] Abdominal pain [R10.9]  1. Acute renal failure on Chronic  Kidney Disease stage IV with hyperkalemia:  baseline creatinine of 2.1, eGFR of 28 03/13/2015. acute renal failure due to severe anemia, hypoxia and prerenal azotemia from poor PO intake and vomiting.  Chronic Kidney Disease is secondary to NSAID use and sickle cell anemia. Left IJ PC placed 06/13/14. - it appears that the patient has benefited from hemodialysis. However at present no urgent indication for dialysis. We will reassess for need of this over the next several days.  2. Hyperkalemia - Potassium down to 4.6 now.  3. Hypertension: Blood pressure currently 116/75. Continue metoprolol.   4. Secondary Hyperparathyroidism: PTH 250. Calcium at goal during admission.  - Continue calcitriol   5.  ascites and anasarca -  low-dose Lasix infusion was ineffective and low-dose spironolactone was discontinued due to hyperkalemia - 2-D echo shows EF 50-55%, moderate mitral regurgitation, severe tricuspid regurgitation  - Valvular abnormalities make fluid management extremely challenging  6. 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 Hemoglobin back to her baseline at 7. - Hemoglobin currently up to 7.7 which is actually above her baseline. Continue to monitor CBC..     LOS: 12 Jane Short 1/16/20175:19 PM

## 2015-06-17 NOTE — Care Management Note (Signed)
I am watching patient daily for any advance to ESRD and need for outpatient dialysis. Working on [facility] records in case a placement is required.  Iran Sizer  Dialysis Liaison   [phone]

## 2015-06-18 ENCOUNTER — Encounter
Admission: EM | Disposition: A | Discharge status: Other Acute Inpatient Hospital | Payer: Self-pay | Source: Home / Self Care | Attending: Internal Medicine

## 2015-06-18 ENCOUNTER — Inpatient Hospital Stay: Payer: BLUE CROSS/BLUE SHIELD

## 2015-06-18 LAB — GLUCOSE, SEROUS FLUID: GLUCOSE FL: 95 mg/dL

## 2015-06-18 LAB — COMPREHENSIVE METABOLIC PANEL
ALBUMIN: 3 g/dL — AB (ref 3.5–5.0)
ALT: 41 U/L (ref 14–54)
ANION GAP: 8 (ref 5–15)
AST: 39 U/L (ref 15–41)
Alkaline Phosphatase: 139 U/L — ABNORMAL HIGH (ref 38–126)
BILIRUBIN TOTAL: 12.9 mg/dL — AB (ref 0.3–1.2)
BUN: 83 mg/dL — ABNORMAL HIGH (ref 6–20)
CALCIUM: 9.7 mg/dL (ref 8.9–10.3)
CO2: 25 mmol/L (ref 22–32)
Chloride: 100 mmol/L — ABNORMAL LOW (ref 101–111)
Creatinine, Ser: 2.32 mg/dL — ABNORMAL HIGH (ref 0.44–1.00)
GFR, EST AFRICAN AMERICAN: 24 mL/min — AB (ref >60)
GFR, EST NON AFRICAN AMERICAN: 21 mL/min — AB (ref >60)
Glucose, Bld: 156 mg/dL — ABNORMAL HIGH (ref 65–99)
POTASSIUM: 4.5 mmol/L (ref 3.5–5.1)
Sodium: 133 mmol/L — ABNORMAL LOW (ref 135–145)
TOTAL PROTEIN: 6.4 g/dL — AB (ref 6.5–8.1)

## 2015-06-18 LAB — CBC
HEMATOCRIT: 23.1 % — AB (ref 35.0–47.0)
HEMOGLOBIN: 7.6 g/dL — AB (ref 12.0–16.0)
MCH: 32 pg (ref 26.0–34.0)
MCHC: 33 g/dL (ref 32.0–36.0)
MCV: 96.9 fL (ref 80.0–100.0)
Platelets: 160 10*3/uL (ref 150–440)
RBC: 2.38 MIL/uL — AB (ref 3.80–5.20)
RDW: 23.9 % — ABNORMAL HIGH (ref 11.5–14.5)
WBC: 9.8 10*3/uL (ref 3.6–11.0)

## 2015-06-18 LAB — BODY FLUID CELL COUNT WITH DIFFERENTIAL
Eos, Fluid: 0 %
Lymphs, Fluid: 37 %
Monocyte-Macrophage-Serous Fluid: 23 %
Neutrophil Count, Fluid: 35 %
Other Cells, Fluid: 5 %
Total Nucleated Cell Count, Fluid: 74 cu mm

## 2015-06-18 LAB — PROTEIN, BODY FLUID

## 2015-06-18 LAB — LACTATE DEHYDROGENASE, PLEURAL OR PERITONEAL FLUID: LD, Fluid: 125 U/L — ABNORMAL HIGH (ref 3–23)

## 2015-06-18 SURGERY — ERCP, WITH INTERVENTION IF INDICATED
Anesthesia: General | Laterality: Left

## 2015-06-18 MED ORDER — MIDAZOLAM HCL 5 MG/5ML IJ SOLN
INTRAMUSCULAR | Status: AC | PRN
  Administered 2015-06-18 (×2): 1 mg via INTRAVENOUS

## 2015-06-18 MED ORDER — CEFAZOLIN SODIUM 1-5 GM-% IV SOLN
1.0000 g | Freq: Once | INTRAVENOUS | Status: DC
  Filled 2015-06-18: qty 50

## 2015-06-18 MED ORDER — FENTANYL CITRATE (PF) 100 MCG/2ML IJ SOLN
INTRAMUSCULAR | Status: AC | PRN
  Administered 2015-06-18: 50 ug via INTRAVENOUS

## 2015-06-18 MED ORDER — FENTANYL CITRATE (PF) 100 MCG/2ML IJ SOLN
INTRAMUSCULAR | Status: AC
  Filled 2015-06-18: qty 4

## 2015-06-18 MED ORDER — CEFAZOLIN (ANCEF) 1 G IV SOLR: 1.0000 g | INTRAVENOUS | Status: DC

## 2015-06-18 MED ORDER — SODIUM CHLORIDE 0.9 % IV SOLN: INTRAVENOUS | Status: DC

## 2015-06-18 MED ORDER — MIDAZOLAM HCL 5 MG/5ML IJ SOLN
INTRAMUSCULAR | Status: AC
  Filled 2015-06-18: qty 5

## 2015-06-18 NOTE — Progress Notes (Signed)
Hemodialysis completed. 

## 2015-06-18 NOTE — Clinical Documentation Improvement (Signed)
Cardiology Internal Medicine  Can the diagnosis of CHF be further specified?    Acuity - Acute, Chronic, Acute on Chronic   Type - Systolic, Diastolic, Systolic and Diastolic  Other  Clinically Undetermined   Document any associated diagnoses/conditions   Supporting Information: CHF currently documented as well as moderate mitral regurgitation and severe tricuspid regurgitation (Echo done 06/05/15.) BNP 06/05/15 683.0 Please clarify if CHF present and if so document acuity and type.    Please exercise your independent, professional judgment when responding. A specific answer is not anticipated or expected.Please update your documentation within the medical record to reflect your response to this query.  Thank you, Mateo Flow, RN [phone] Clinical Documentation Specialist

## 2015-06-18 NOTE — Progress Notes (Signed)
Case discussed with Dr. Bridgett Larsson. PT assessment pending for discharge disposition.

## 2015-06-18 NOTE — Progress Notes (Signed)
Post hd tx 

## 2015-06-18 NOTE — Sedation Documentation (Signed)
Ascites-paracentesis done prior to liver biopsy.

## 2015-06-18 NOTE — Progress Notes (Signed)
Pre-hd tx 

## 2015-06-18 NOTE — Progress Notes (Addendum)
Patient ID: Jane Short, female   DOB: Aug 31, 1950, 65 y.o.   MRN: [id number] [facility] PROGRESS NOTE  PCP: [name], MD  HPI/Subjective: Patient has no SOB or abdominal pain or distension. S/p HD and  liver biopsy today.  Objective: Filed Vitals:   06/18/15 1441 06/18/15 1508  BP: 106/76 108/63  Pulse: 85 70  Temp:  98 F (36.7 C)  Resp: 15 18    Filed Weights   06/18/15 0950 06/18/15 1253 06/18/15 1508  Weight: 91.8 kg (202 lb 6.1 oz) 89.8 kg (197 lb 15.6 oz) 88.86 kg (195 lb 14.4 oz)    ROS: Review of Systems  Constitutional: Negative for fever and chills.  Eyes: Negative for blurred vision.  Respiratory: Negative for cough and shortness of breath.   Cardiovascular: Negative for chest pain.  Gastrointestinal: Negative for nausea, vomiting, abdominal pain, diarrhea and constipation.  Genitourinary: Negative for dysuria.  Musculoskeletal: Negative for joint pain.  Neurological: Negative for dizziness and headaches.   Exam: Physical Exam  Constitutional: She is oriented to person, place, and time.  HENT:  Nose: No mucosal edema.  Mouth/Throat: No oropharyngeal exudate or posterior oropharyngeal edema.  Eyes: Conjunctivae, EOM and lids are normal. Pupils are equal, round, and reactive to light. Scleral icterus is present.  Neck: No JVD present. Carotid bruit is not present. No edema present. No thyroid mass and no thyromegaly present.  Cardiovascular: S1 normal and S2 normal.  Exam reveals no gallop.   Murmur heard.  Systolic murmur is present with a grade of 2/6  Pulses:      Dorsalis pedis pulses are 2+ on the right side, and 2+ on the left side.  B/l leg edema.  Respiratory: No respiratory distress. She has decreased breath sounds in the right lower field and the left lower field. She has no wheezes. She has no rhonchi. She has no rales.  GI: Soft. Bowel sounds are normal. There is no tenderness.  Lymphadenopathy:    She has no  cervical adenopathy.  Neurological: She is alert and oriented to person, place, and time. No cranial nerve deficit.  Skin: Skin is warm. No rash noted. Nails show no clubbing.  Jaundiced  Psychiatric: She has a normal mood and affect.     Data Reviewed: Basic Metabolic Panel:  Recent Labs Lab 06/13/15 0455 06/14/15 0507 06/15/15 0458 06/16/15 0514 06/18/15 0442  NA 134* 135 137 136 133*  K 5.4* 5.9* 5.7* 4.6 4.5  CL 101 100* 100* 99* 100*  CO2 24 25 28 27 25   GLUCOSE 133* 113* 114* 145* 156*  BUN 111* 116* 94* 71* 83*  CREATININE 3.19* 3.23* 2.66* 2.28* 2.32*  CALCIUM 9.7 10.3 9.9 9.5 9.7   Liver Function Tests:  Recent Labs Lab 06/13/15 0455 06/15/15 0458 06/16/15 0514 06/18/15 0442  AST 38 38 30 39  ALT 28 30 29  41  ALKPHOS 124 101 107 139*  BILITOT 15.5* 17.5* 16.1* 12.9*  PROT 6.8 6.6 6.3* 6.4*  ALBUMIN 2.9* 2.9* 2.8* 3.0*   CBC:  Recent Labs Lab 06/13/15 0559 06/15/15 0458 06/16/15 0514 06/18/15 0442  WBC 5.4  --  8.0 9.8  HGB 6.8* 6.8* 7.7* 7.6*  HCT 19.7*  --  23.0* 23.1*  MCV 92.5  --  95.0 96.9  PLT 199  --  186 160   BNP (last 3 results)  Recent Labs  06/05/15 0407  BNP 683.0*    CBG: No results for input(s): GLUCAP in the last 168  hours.    Scheduled Meds: . allopurinol  100 mg Oral Daily  . calcitRIOL  0.25 mcg Oral Daily  . collagenase  1 application Topical Daily  . cyclobenzaprine  5 mg Oral QHS  . folic acid  1 mg Oral Daily  . heparin lock flush  500 Units Intravenous Once  . hydroxyurea  500 mg Oral Daily  . ipratropium-albuterol  3 mL Nebulization TID  . metoprolol tartrate  50 mg Oral BID  . mometasone-formoterol  2 puff Inhalation BID  . polyethylene glycol  17 g Oral BID  . predniSONE  50 mg Oral Q breakfast    Assessment/Plan:  1. Sustained wide-complex arrhythmia which spontaneously broke. On metoprolol to 50 mg twice a day. No further arrythmia 2. Abdominal distention and swelling, shortness of breath.   likely with cirrhosis- dialysis helped manage fluid. Dialysis may be the only way to manage fluid with her liver and kidney failure. Dialysis today as per Dr. Holley Raring. 3. Acute renal failure on chronic kidney disease with hyperkalemia- improving after HD.   4. Sickle cell disease status post 2 unit of transfusion with significant anemia. On hydroxyurea.  5. Jaundice, ultrasound of the abdomen consistent with cirrhosis. The hepatitis B and hepatitis C are negative. AMA elevated at 31.1. Ceruloplasmin elevated at 53.6. She was given 3 days of oral vitamin K. S/p paracentesis (700 ml) and iver biopsy today. Bili is down to 12.9. F/u Dr. Candace Cruise as outpatient. 6. Hyperlipidemia - hold Crestor 7. Essential hypertension- blood pressure on the lower side 8. History of COPD 9. Atrial fibrillation with rapid ventricular response on admission, rate controlled.  10. Acute diastolic CHF,  Echocardiogram shows EF 50-55% with moderate mitral regurgitation and severe tricuspid regurgitation. Lasix did not work well. Continue HD.  PT consult.  Code Status:     Code Status Orders        Start     Ordered   06/05/15 1106  Full code   Continuous     06/05/15 1105    Code Status History    Date Active Date Inactive Code Status Order ID Comments User Context   This patient has a current code status but no historical code status.     Disposition Plan: possible d/c in 2 days.  Greater than 50% time was spent on coordination of care and face-to-face counseling. Discussed with her husband.  Time spent: 35 minutes.  Demetrios Loll  [facility]

## 2015-06-18 NOTE — Procedures (Signed)
Successful 700 cc paracentesis, to allow percutaneous US liver bx 2 18 g cores No comp EBL 0 Path penidng Full report in pacs

## 2015-06-18 NOTE — Progress Notes (Signed)
Hemodialysis start 

## 2015-06-18 NOTE — Progress Notes (Signed)
[facility]  ROUNDING NOTE   Subjective:  Creatinine was noted as being up to 2.3 and BUN also rising to 83. Therefore we decided to proceed with dialysis today. Patient seen during hemodialysis and tolerating well thus far.   Objective:  Vital signs in last 24 hours:  Temp:  [98 F (36.7 C)-98.5 F (36.9 C)] 98 F (36.7 C) (01/17 0950) Pulse Rate:  [46-100] 96 (01/17 1000) Resp:  [17-18] 17 (01/17 1000) BP: (116-131)/(56-91) 131/76 mmHg (01/17 1000) SpO2:  [90 %-96 %] 95 % (01/17 0950) Weight:  [91.8 kg (202 lb 6.1 oz)] 91.8 kg (202 lb 6.1 oz) (01/17 0950)  Weight change:  Filed Weights   06/15/15 1656 06/15/15 1742 06/18/15 0950  Weight: 90.4 kg (199 lb 4.7 oz) 87.726 kg (193 lb 6.4 oz) 91.8 kg (202 lb 6.1 oz)    Intake/Output: I/O last 3 completed shifts: In: 480 [P.O.:480] Out: 150 [Urine:150]   Intake/Output this shift:     Physical Exam: General: NAD  Head: Normocephalic, atraumatic. Moist oral mucosal membranes  Eyes: scleral icterus  Neck: Supple, trachea midline  Lungs:  CTAB, normal respiratory effort   Heart: Irregular, 3/6 murmur  Abdomen:  Soft, nontender, +bowel sounds, distended  Extremities:  anasarca   Neurologic:  alert, oriented, follows commands  Skin: Ulcerated lesions bilateral lower extremities    left IJ PermCath     Basic Metabolic Panel:  Recent Labs Lab 06/13/15 0455 06/14/15 0507 06/15/15 0458 06/16/15 0514 06/18/15 0442  NA 134* 135 137 136 133*  K 5.4* 5.9* 5.7* 4.6 4.5  CL 101 100* 100* 99* 100*  CO2 _0 GLUCOSE 133* 113* 114* 145* 156*  BUN 111* 116* 94* 71* 83*  CREATININE 3.19* 3.23* 2.66* 2.28* 2.32*  CALCIUM 9.7 10.3 9.9 9.5 9.7    Liver Function Tests:  Recent Labs Lab 06/13/15 0455 06/15/15 0458 06/16/15 0514 06/18/15 0442  AST 38 38 30 39  ALT _1 41  ALKPHOS 124 101 107 139*  BILITOT 15.5* 17.5* 16.1* 12.9*  PROT 6.8 6.6 6.3* 6.4*  ALBUMIN 2.9* 2.9* 2.8* 3.0*   No  results for input(s): LIPASE, AMYLASE in the last 168 hours. No results for input(s): AMMONIA in the last 168 hours.  CBC:  Recent Labs Lab 06/13/15 0559 06/15/15 0458 06/16/15 0514 06/18/15 0442  WBC 5.4  --  8.0 9.8  HGB 6.8* 6.8* 7.7* 7.6*  HCT 19.7*  --  23.0* 23.1*  MCV 92.5  --  95.0 96.9  PLT 199  --  186 160    Cardiac Enzymes: No results for input(s): CKTOTAL, CKMB, CKMBINDEX, TROPONINI in the last 168 hours.  BNP: Invalid input(s): POCBNP  CBG: No results for input(s): GLUCAP in the last 168 hours.  Microbiology: Results for orders placed or performed in visit on 05/02/12  Urine culture     Status: None   Collection Time: 05/02/12  4:31 PM  Result Value Ref Range Status   Micro Text Report   Final       SOURCE: CLEAN CATCH    COMMENT                   NO GROWTH IN 36 HOURS   ANTIBIOTIC  Coagulation Studies:  Recent Labs  06/17/15 1408  LABPROT 16.7*  INR 1.34    Urinalysis: No results for input(s): COLORURINE, LABSPEC, PHURINE, GLUCOSEU, HGBUR, BILIRUBINUR, KETONESUR, PROTEINUR, UROBILINOGEN, NITRITE, LEUKOCYTESUR in the last 72 hours.  Invalid input(s): APPERANCEUR    Imaging: No results found.   Medications:   . sodium chloride     . allopurinol  100 mg Oral Daily  . calcitRIOL  0.25 mcg Oral Daily  . collagenase  1 application Topical Daily  . cyclobenzaprine  5 mg Oral QHS  . folic acid  1 mg Oral Daily  . heparin lock flush  500 Units Intravenous Once  . hydroxyurea  500 mg Oral Daily  . ipratropium-albuterol  3 mL Nebulization TID  . metoprolol tartrate  50 mg Oral BID  . mometasone-formoterol  2 puff Inhalation BID  . polyethylene glycol  17 g Oral BID  . predniSONE  50 mg Oral Q breakfast     Assessment/ Plan:  [name] is a 65 y.o. black female  with sickle cell disease, atrial myxoma who was admitted on 06/05/2015 for Acute pulmonary edema ([place])  [J81.0] Hypoxia [R09.02] Atrial fibrillation with rapid ventricular response ([place]) [I48.91] Elevated bilirubin [R17] Abdominal pain [R10.9]  1. 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 acute renal failure due to severe anemia, hypoxia and prerenal azotemia from poor PO intake and vomiting.  Chronic Kidney Disease is secondary to NSAID use and sickle cell anemia. Left IJ PC placed 06/13/14. - renal function worsening at present. BUN up to 83 and creatinine up to 2.3. Therefore we will proceed with dialysis. Patient seen during dialysis today and tolerating well thus far. Continue to reassess periodically for dialysis.  2. Hyperkalemia - Potassium 4.5 this a.m. and acceptable.  3. Hypertension: Blood pressure 131/76 at last check. Continue metoprolol.   4. Secondary Hyperparathyroidism: PTH 250. Calcium at goal during admission.  - Continue calcitriol   5.  ascites and anasarca -  low-dose Lasix infusion was ineffective and low-dose spironolactone was discontinued due to hyperkalemia - 2-D echo shows EF 50-55%, moderate mitral regurgitation, severe tricuspid regurgitation  - Valvular abnormalities make fluid management extremely challenging - Ultrafiltration target 2 kg today.  6. 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 Hemoglobin back to her baseline at 7. - Hemoglobin at her baseline of 7.6.     LOS: 13 Jane Short 1/17/201710:12 AM

## 2015-06-18 NOTE — Consult Note (Signed)
  GI Inpatient Follow-up Note  Patient Identification: Jane Short is a 65 y.o. female  Subjective: Had dialysis and liver biopsy today. T.bili finally coming down.  Scheduled Inpatient Medications:  . allopurinol  100 mg Oral Daily  . calcitRIOL  0.25 mcg Oral Daily  . collagenase  1 application Topical Daily  . cyclobenzaprine  5 mg Oral QHS  . folic acid  1 mg Oral Daily  . heparin lock flush  500 Units Intravenous Once  . hydroxyurea  500 mg Oral Daily  . ipratropium-albuterol  3 mL Nebulization TID  . metoprolol tartrate  50 mg Oral BID  . mometasone-formoterol  2 puff Inhalation BID  . polyethylene glycol  17 g Oral BID  . predniSONE  50 mg Oral Q breakfast    Continuous Inpatient Infusions:   . sodium chloride      PRN Inpatient Medications:  sodium chloride, acetaminophen **OR** acetaminophen, HYDROcodone-acetaminophen, iohexol, morphine injection, ondansetron **OR** ondansetron (ZOFRAN) IV, pentafluoroprop-tetrafluoroeth, sodium chloride  Review of Systems: Constitutional: Weight is stable.  Eyes: No changes in vision. ENT: No oral lesions, sore throat.  GI: see HPI.  Heme/Lymph: No easy bruising.  CV: No chest pain.  GU: No hematuria.  Integumentary: No rashes.  Neuro: No headaches.  Psych: No depression/anxiety.  Endocrine: No heat/cold intolerance.  Allergic/Immunologic: No urticaria.  Resp: No cough, SOB.  Musculoskeletal: No joint swelling.    Physical Examination: BP 108/63 mmHg  Pulse 70  Temp(Src) 98 F (36.7 C) (Oral)  Resp 18  Ht 5\' 7"  (1.702 m)  Wt 88.86 kg (195 lb 14.4 oz)  BMI 30.68 kg/m2  SpO2 99% Gen: NAD, alert and oriented x 4 HEENT: PEERLA, EOMI, Neck: supple, no JVD or thyromegaly Chest: CTA bilaterally, no wheezes, crackles, or other adventitious sounds CV: RRR, no m/g/c/r Abd: soft, NT, ND, +BS in all four quadrants; no HSM, guarding, ridigity, or rebound tenderness Ext: no edema, well perfused with 2+ pulses, Skin:  no rash or lesions noted Lymph: no LAD  Data: Lab Results  Component Value Date   WBC 9.8 06/18/2015   HGB 7.6* 06/18/2015   HCT 23.1* 06/18/2015   MCV 96.9 06/18/2015   PLT 160 06/18/2015    Recent Labs Lab 06/15/15 0458 06/16/15 0514 06/18/15 0442  HGB 6.8* 7.7* 7.6*   Lab Results  Component Value Date   NA 133* 06/18/2015   K 4.5 06/18/2015   CL 100* 06/18/2015   CO2 25 06/18/2015   BUN 83* 06/18/2015   CREATININE 2.32* 06/18/2015   GLU 89 02/20/2010   Lab Results  Component Value Date   ALT 41 06/18/2015   AST 39 06/18/2015   ALKPHOS 139* 06/18/2015   BILITOT 12.9* 06/18/2015    Recent Labs Lab 06/14/15 1557  06/17/15 1408  APTT 26  --   --   INR 1.40  < > 1.34  < > = values in this interval not displayed. Assessment/Plan: Ms. Honts is a 65 y.o. 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  Recommendations: Await final liver biopsy results. If T. Bili continues to drop, then pt can be discharged to home from GI point of view. IF T.bili rises again, can start daily ursodiol though I prefer to wait until liver biopsy results are back. Can discuss results in our office later. thanks Please call with questions or concerns.  Michell Kader, Lupita Dawn, MD

## 2015-06-19 ENCOUNTER — Inpatient Hospital Stay: Payer: BLUE CROSS/BLUE SHIELD

## 2015-06-19 LAB — RENAL FUNCTION PANEL
ALBUMIN: 3 g/dL — AB (ref 3.5–5.0)
ANION GAP: 6 (ref 5–15)
BUN: 62 mg/dL — ABNORMAL HIGH (ref 6–20)
CALCIUM: 9.8 mg/dL (ref 8.9–10.3)
CO2: 27 mmol/L (ref 22–32)
Chloride: 104 mmol/L (ref 101–111)
Creatinine, Ser: 1.7 mg/dL — ABNORMAL HIGH (ref 0.44–1.00)
GFR calc Af Amer: 36 mL/min — ABNORMAL LOW (ref >60)
GFR, EST NON AFRICAN AMERICAN: 31 mL/min — AB (ref >60)
GLUCOSE: 156 mg/dL — AB (ref 65–99)
PHOSPHORUS: UNDETERMINED mg/dL (ref 2.5–4.6)
POTASSIUM: 4.4 mmol/L (ref 3.5–5.1)
SODIUM: 137 mmol/L (ref 135–145)

## 2015-06-19 LAB — PATHOLOGIST SMEAR REVIEW

## 2015-06-19 MED ORDER — PREDNISONE 20 MG PO TABS
30.0000 mg | ORAL_TABLET | Freq: Every day | ORAL | Status: DC
  Administered 2015-06-20: 30 mg via ORAL
  Filled 2015-06-19: qty 1

## 2015-06-19 NOTE — Progress Notes (Signed)
Patient ID: Jane Short, female   DOB: [birth year], 65 y.o.   MRN: [id number] [facility] PROGRESS NOTE  PCP: [name], MD  HPI/Subjective: Patient has no complaint. More urine output.  Objective: Filed Vitals:   06/19/15 0425 06/19/15 1114  BP: 122/74 120/67  Pulse: 91 85  Temp: 98.1 F (36.7 C) 97.8 F (36.6 C)  Resp: 17 17    Filed Weights   06/18/15 0950 06/18/15 1253 06/18/15 1508  Weight: 91.8 kg (202 lb 6.1 oz) 89.8 kg (197 lb 15.6 oz) 88.86 kg (195 lb 14.4 oz)    ROS: Review of Systems  Constitutional: Negative for fever and chills.  Eyes: Negative for blurred vision.  Respiratory: Negative for cough and shortness of breath.   Cardiovascular: Negative for chest pain.  Gastrointestinal: Negative for nausea, vomiting, abdominal pain, diarrhea and constipation.  Genitourinary: Negative for dysuria.  Musculoskeletal: Negative for joint pain.  Neurological: Negative for dizziness and headaches.   Exam: Physical Exam  Constitutional: She is oriented to person, place, and time.  HENT:  Nose: No mucosal edema.  Mouth/Throat: No oropharyngeal exudate or posterior oropharyngeal edema.  Eyes: Conjunctivae, EOM and lids are normal. Pupils are equal, round, and reactive to light. Scleral icterus is present.  Neck: No JVD present. Carotid bruit is not present. No edema present. No thyroid mass and no thyromegaly present.  Cardiovascular: S1 normal and S2 normal.  Exam reveals no gallop.   Murmur heard.  Systolic murmur is present with a grade of 2/6  Pulses:      Dorsalis pedis pulses are 2+ on the right side, and 2+ on the left side.  B/l leg edema.  Respiratory: No respiratory distress. She has decreased breath sounds in the right lower field and the left lower field. She has no wheezes. She has no rhonchi. She has no rales.  GI: Soft. Bowel sounds are normal. There is no tenderness.  Lymphadenopathy:    She has no cervical adenopathy.   Neurological: She is alert and oriented to person, place, and time. No cranial nerve deficit.  Skin: Skin is warm. No rash noted. Nails show no clubbing.  Jaundiced  Psychiatric: She has a normal mood and affect.     Data Reviewed: Basic Metabolic Panel:  Recent Labs Lab 06/14/15 0507 06/15/15 0458 06/16/15 0514 06/18/15 0442 06/19/15 0905  NA 135 137 136 133* 137  K 5.9* 5.7* 4.6 4.5 4.4  CL 100* 100* 99* 100* 104  CO2 25 28 27 25 27   GLUCOSE 113* 114* 145* 156* 156*  BUN 116* 94* 71* 83* 62*  CREATININE 3.23* 2.66* 2.28* 2.32* 1.70*  CALCIUM 10.3 9.9 9.5 9.7 9.8  PHOS  --   --   --   --  UNABLE TO REPORT DUE TO ICTERUS   Liver Function Tests:  Recent Labs Lab 06/13/15 0455 06/15/15 0458 06/16/15 0514 06/18/15 0442 06/19/15 0905  AST 38 38 30 39  --   ALT 28 30 29  41  --   ALKPHOS 124 101 107 139*  --   BILITOT 15.5* 17.5* 16.1* 12.9*  --   PROT 6.8 6.6 6.3* 6.4*  --   ALBUMIN 2.9* 2.9* 2.8* 3.0* 3.0*   CBC:  Recent Labs Lab 06/13/15 0559 06/15/15 0458 06/16/15 0514 06/18/15 0442  WBC 5.4  --  8.0 9.8  HGB 6.8* 6.8* 7.7* 7.6*  HCT 19.7*  --  23.0* 23.1*  MCV 92.5  --  95.0 96.9  PLT 199  --  186 160   BNP (last 3 results)  Recent Labs  06/05/15 0407  BNP 683.0*    CBG: No results for input(s): GLUCAP in the last 168 hours.    Scheduled Meds: . allopurinol  100 mg Oral Daily  . calcitRIOL  0.25 mcg Oral Daily  . collagenase  1 application Topical Daily  . cyclobenzaprine  5 mg Oral QHS  . folic acid  1 mg Oral Daily  . heparin lock flush  500 Units Intravenous Once  . hydroxyurea  500 mg Oral Daily  . ipratropium-albuterol  3 mL Nebulization TID  . metoprolol tartrate  50 mg Oral BID  . mometasone-formoterol  2 puff Inhalation BID  . polyethylene glycol  17 g Oral BID  . [START ON 06/20/2015] predniSONE  30 mg Oral Q breakfast    Assessment/Plan:  1. Sustained wide-complex arrhythmia which spontaneously broke. On metoprolol to 50  mg twice a day. No further arrythmia 2. Abdominal distention and swelling, shortness of breath.  likely with cirrhosis- improved after dialysis. Dialysis may be the only way to manage fluid with her liver and kidney failure. 3. Acute renal failure on chronic kidney disease with hyperkalemia- improving with HD.  No urgent indication for dialysis at present. We will keep PermCath in place for now per Dr. Holley Raring. 4. Sickle cell disease status post 2 unit of transfusion with significant anemia. On hydroxyurea.  5. Jaundice, ultrasound of the abdomen consistent with cirrhosis. The hepatitis B and hepatitis C are negative. AMA elevated at 31.1. Ceruloplasmin elevated at 53.6. She was given 3 days of oral vitamin K. S/p paracentesis (700 ml) and iver biopsy. Bili is down to 12.9. F/u Dr. Candace Cruise as outpatient. 6. Hyperlipidemia - hold Crestor 7. Essential hypertension- blood pressure on the lower side 8. History of COPD 9. Atrial fibrillation with rapid ventricular response on admission, rate controlled.  10. Acute diastolic CHF,  Echocardiogram shows EF 50-55% with moderate mitral regurgitation and severe tricuspid regurgitation. Lasix did not work well. Continue HD.  She tolerated PT well, doesn't need home PT. Code Status:     Code Status Orders        Start     Ordered   06/05/15 1106  Full code   Continuous     06/05/15 1105    Code Status History    Date Active Date Inactive Code Status Order ID Comments User Context   This patient has a current code status but no historical code status.     Disposition Plan: possible d/c tomorrow.  Greater than 50% time was spent on coordination of care and face-to-face counseling. Discussed with her husband.  Time spent: 30 minutes.  Demetrios Loll  [facility] Hospitalists

## 2015-06-19 NOTE — Evaluation (Signed)
Physical Therapy Evaluation Patient Details Name: Jane Short MRN: [id number] DOB: 01/28/1951 Today's Date: 06/19/2015   History of Present Illness  Pt is here with CHF, she had blood transfusions and overall she is feeling better at time of eval.  Clinical Impression  Pt feeling better reports she is not at her baseline but doesn't feel too far off.  She is able to ambulate around the nurses' station and negotiates steps w/o O2, w/o AD and generally does well.  She has some activity tolerance issues with O2 dropping to the high 80s but quickly recovering to mid 90s. Pt with relatively slow, deliberate ambulation but no safety issues.     Follow Up Recommendations No PT follow up    Equipment Recommendations       Recommendations for Other Services       Precautions / Restrictions Precautions Precautions: None Restrictions Weight Bearing Restrictions: No      Mobility  Bed Mobility Overal bed mobility: Independent             General bed mobility comments: Pt able to get to and sit at EOB w/o issue  Transfers Overall transfer level: Independent Equipment used: None             General transfer comment: Pt maintains balance well and w/o AD  Ambulation/Gait Ambulation/Gait assistance: Supervision Ambulation Distance (Feet): 250 Feet Assistive device: None       General Gait Details: Pt is able to ambulate with good confidence and generally was safe and consistent with the effort.  She had no LOBs and reports the be near her baseline with speed and confidence.  Pt's O2 in the 90s t/o most of ambulation, though it does drop to 89% by the end, increases to mid 90s on room air after briefly sitting after ambulation.   Stairs Stairs: Yes Stairs assistance: Modified independent (Device/Increase time) Stair Management: One rail Right Number of Stairs: 6    Wheelchair Mobility    Modified Rankin (Stroke Patients Only)       Balance                                             Pertinent Vitals/Pain Pain Assessment: No/denies pain    Home Living Family/patient expects to be discharged to:: Private residence Living Arrangements: Spouse/significant other     Home Access: Stairs to enter   Technical brewer of Steps: 3   Home Equipment: Environmental consultant - 2 wheels;Cane - single point      Prior Function Level of Independence: Independent         Comments: PT reports that she is able to get out of the house regularly, drives limited distances occasionally     Hand Dominance        Extremity/Trunk Assessment   Upper Extremity Assessment: Overall WFL for tasks assessed           Lower Extremity Assessment: Overall WFL for tasks assessed         Communication   Communication: No difficulties  Cognition Arousal/Alertness: Awake/alert Behavior During Therapy: WFL for tasks assessed/performed Overall Cognitive Status: Within Functional Limits for tasks assessed                      General Comments      Exercises        Assessment/Plan  PT Assessment Patient needs continued PT services  PT Diagnosis Generalized weakness   PT Problem List Decreased activity tolerance;Decreased balance;Decreased mobility  PT Treatment Interventions Gait training;Stair training;Therapeutic activities;Therapeutic exercise;Neuromuscular re-education;Balance training   PT Goals (Current goals can be found in the Care Plan section) Acute Rehab PT Goals Patient Stated Goal: Go home PT Goal Formulation: With patient Time For Goal Achievement: 06/19/15 Potential to Achieve Goals: Good    Frequency Min 2X/week   Barriers to discharge        Co-evaluation               End of Session Equipment Utilized During Treatment: Gait belt Activity Tolerance: Patient tolerated treatment well;Patient limited by fatigue (pt's O2 did drop to 89% on room air with ambulation) Patient left: with call  bell/phone within reach;in chair           Time: [id number] PT Time Calculation (min) (ACUTE ONLY): 19 min   Charges:   PT Evaluation $PT Eval Low Complexity: 1 Procedure     PT G Codes:       Wayne Both, PT, DPT [phone]  Kreg Shropshire 06/19/2015, 11:39 AM

## 2015-06-19 NOTE — Care Management Note (Signed)
Case Management Note  Patient Details  Name: Sol Beaber MRN: [id number] Date of Birth: Dec 11, 1950  Subjective/Objective:   Spoke with patient. It is anticipated that patient will discharge tomorrow. Patient states she will be going back to [place] to live and traveling back and forth to her doctors in [place]. Discussed home ehalth services and patient doesn't feel it is appropriate at this time.                  Action/Plan:   Expected Discharge Date:                  Expected Discharge Plan:  Home/Self Care  In-House Referral:     Discharge planning Services  CM Consult  Post Acute Care Choice:    Choice offered to:     DME Arranged:    DME Agency:     HH Arranged:  Patient Refused HH Agency:     Status of Service:  Completed, signed off  Medicare Important Message Given:    Date Medicare IM Given:    Medicare IM give by:    Date Additional Medicare IM Given:    Additional Medicare Important Message give by:     If discussed at [place] of Stay Meetings, dates discussed:    Additional Comments:  Jolly Mango, RN 06/19/2015, 3:18 PM

## 2015-06-19 NOTE — Progress Notes (Signed)
[facility]  ROUNDING NOTE   Subjective:  BUN and creatinine doubled down under the influence of dialysis. Worked with physical therapy today and had mild O2 desaturation but otherwise did well.   Objective:  Vital signs in last 24 hours:  Temp:  [97.8 F (36.6 C)-98.1 F (36.7 C)] 97.8 F (36.6 C) (01/18 1114) Pulse Rate:  [70-101] 85 (01/18 1114) Resp:  [15-21] 17 (01/18 1114) BP: (105-139)/(63-76) 120/67 mmHg (01/18 1114) SpO2:  [93 %-100 %] 94 % (01/18 1114) Weight:  [88.86 kg (195 lb 14.4 oz)] 88.86 kg (195 lb 14.4 oz) (01/17 1508)  Weight change:  Filed Weights   06/18/15 0950 06/18/15 1253 06/18/15 1508  Weight: 91.8 kg (202 lb 6.1 oz) 89.8 kg (197 lb 15.6 oz) 88.86 kg (195 lb 14.4 oz)    Intake/Output: I/O last 3 completed shifts: In: 240 [P.O.:240] Out: 2000 [Other:2000]   Intake/Output this shift:  Total I/O In: 240 [P.O.:240] Out: -   Physical Exam: General: NAD  Head: Normocephalic, atraumatic. Moist oral mucosal membranes  Eyes: scleral icterus  Neck: Supple, trachea midline  Lungs:  CTAB, normal respiratory effort   Heart: Irregular, 3/6 murmur  Abdomen:  Soft, nontender, +bowel sounds, distended  Extremities:  anasarca   Neurologic:  alert, oriented, follows commands  Skin: Ulcerated lesions bilateral lower extremities, 1+ b/l LE edema    left IJ PermCath     Basic Metabolic Panel:  Recent Labs Lab 06/14/15 0507 06/15/15 0458 06/16/15 0514 06/18/15 0442 06/19/15 0905  NA 135 137 136 133* 137  K 5.9* 5.7* 4.6 4.5 4.4  CL 100* 100* 99* 100* 104  CO2 _0 GLUCOSE 113* 114* 145* 156* 156*  BUN 116* 94* 71* 83* 62*  CREATININE 3.23* 2.66* 2.28* 2.32* 1.70*  CALCIUM 10.3 9.9 9.5 9.7 9.8  PHOS  --   --   --   --  UNABLE TO REPORT DUE TO ICTERUS    Liver Function Tests:  Recent Labs Lab 06/13/15 0455 06/15/15 0458 06/16/15 0514 06/18/15 0442 06/19/15 0905  AST 38 38 30 39  --   ALT _1 41  --    ALKPHOS 124 101 107 139*  --   BILITOT 15.5* 17.5* 16.1* 12.9*  --   PROT 6.8 6.6 6.3* 6.4*  --   ALBUMIN 2.9* 2.9* 2.8* 3.0* 3.0*   No results for input(s): LIPASE, AMYLASE in the last 168 hours. No results for input(s): AMMONIA in the last 168 hours.  CBC:  Recent Labs Lab 06/13/15 0559 06/15/15 0458 06/16/15 0514 06/18/15 0442  WBC 5.4  --  8.0 9.8  HGB 6.8* 6.8* 7.7* 7.6*  HCT 19.7*  --  23.0* 23.1*  MCV 92.5  --  95.0 96.9  PLT 199  --  186 160    Cardiac Enzymes: No results for input(s): CKTOTAL, CKMB, CKMBINDEX, TROPONINI in the last 168 hours.  BNP: Invalid input(s): POCBNP  CBG: No results for input(s): GLUCAP in the last 168 hours.  Microbiology: Results for orders placed or performed during the hospital encounter of 06/05/15  Body fluid culture     Status: None (Preliminary result)   Collection Time: 06/18/15  2:35 PM  Result Value Ref Range Status   Specimen Description ABDOMEN  Final   Special Requests NONE  Final   Gram Stain PENDING  Incomplete   Culture NO GROWTH < 24 HOURS  Final   Report Status PENDING  Incomplete  Coagulation Studies:  Recent Labs  06/17/15 1408  LABPROT 16.7*  INR 1.34    Urinalysis: No results for input(s): COLORURINE, LABSPEC, PHURINE, GLUCOSEU, HGBUR, BILIRUBINUR, KETONESUR, PROTEINUR, UROBILINOGEN, NITRITE, LEUKOCYTESUR in the last 72 hours.  Invalid input(s): APPERANCEUR    Imaging: US Biopsy  06/18/2015  CLINICAL DATA:  Abnormal LFTs, end-stage renal disease, dialysis dependent, ascites EXAM: ULTRASOUND GUIDED CORE BIOPSY OF RIGHT HEPATIC LOBE MEDICATIONS: 2.0 mg IV Versed; 50 mcg IV Fentanyl Total Moderate Sedation Time: 15 PROCEDURE: The procedure, risks, benefits, and alternatives were explained to the patient. Questions regarding the procedure were encouraged and answered. The patient understands and consents to the procedure. The right upper quad was prepped with ChloraPrep in a sterile fashion, and a  sterile drape was applied covering the operative field. A sterile gown and sterile gloves were used for the procedure. Local anesthesia was provided with 1% Lidocaine. Following completion of the paracentesis yielding 700 cc peritoneal fluid, the liver was localized in the right upper quadrant in the mid axillary line through a lower intercostal space. Under sterile conditions and local anesthesia, a 17 gauge coaxial guide needle was advanced percutaneously under direct ultrasonic into the liver. Needle position confirmed with ultrasound. Images obtained for documentation. Through the access, 2 18 gauge core biopsies obtained. Samples placed in formalin. Needle tract embolized with Gel-Foam. No immediate complication. Patient tolerated the procedure well. COMPLICATIONS: None. FINDINGS: Imaging confirms needle placement in the right hepatic lobe for random core biopsy IMPRESSION: Successful ultrasound percutaneous right hepatic lobe random core biopsy Electronically Signed   By: Jerilynn Mages.  Shick M.D.   On: 06/18/2015 15:11   US Paracentesis  06/18/2015  CLINICAL DATA:  RENAL FAILURE, DIALYSIS DEPENDENT, ABNORMAL LFTS, ABDOMINAL DISTENTION AND ASCITES EXAM: ULTRASOUND GUIDED PARACENTESIS (in preparation for percutaneous ultrasound liver core biopsy and also diagnostic purposes) COMPARISON:  06/11/2015 PROCEDURE: An ultrasound guided paracentesis was thoroughly discussed with the patient and questions answered. The benefits, risks, alternatives and complications were also discussed. The patient understands and wishes to proceed with the procedure. Written consent was obtained. Ultrasound was performed to localize and mark an adequate pocket of fluid in the right upper quadrant of the abdomen. The area was then prepped and draped in the normal sterile fashion. 1% Lidocaine was used for local anesthesia. Under ultrasound guidance a 19 gauge Yueh catheter was introduced. Paracentesis was performed. The catheter was removed  and a dressing applied. COMPLICATIONS: None immediate FINDINGS: A total of approximately 700 cc of amber colored peritoneal fluid was removed. A fluid sample was sent for laboratory analysis. IMPRESSION: Successful ultrasound guided paracentesis yielding 700 cc of ascites, in preparation for percutaneous random liver biopsy to decrease bleeding complications. Electronically Signed   By: Jerilynn Mages.  Shick M.D.   On: 06/18/2015 15:08     Medications:     . allopurinol  100 mg Oral Daily  . calcitRIOL  0.25 mcg Oral Daily  . collagenase  1 application Topical Daily  . cyclobenzaprine  5 mg Oral QHS  . folic acid  1 mg Oral Daily  . heparin lock flush  500 Units Intravenous Once  . hydroxyurea  500 mg Oral Daily  . ipratropium-albuterol  3 mL Nebulization TID  . metoprolol tartrate  50 mg Oral BID  . mometasone-formoterol  2 puff Inhalation BID  . polyethylene glycol  17 g Oral BID  . predniSONE  50 mg Oral Q breakfast     Assessment/ Plan:  [name] is a 65  y.o. 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 acute renal failure due to severe anemia, hypoxia and prerenal azotemia from poor PO intake and vomiting.  Chronic Kidney Disease is secondary to NSAID use and sickle cell anemia. Left IJ PC placed 06/13/14. - patient had hemodialysis yesterday.  Azotemia improved.  No urgent indication for dialysis at present.  We will keep PermCath in place for now..  2. Hyperkalemia - Resolved with dialysis.  Potassium 4.4 today.  3. Hypertension: Blood pressure 120/67 at last check. Continue metoprolol.   4. Secondary Hyperparathyroidism: PTH 250.  Calcium at goal during admission.  - Continue calcitriol 0.25 g by mouth daily  5.  ascites and anasarca -  low-dose Lasix infusion was ineffective and low-dose spironolactone was discontinued due to hyperkalemia - 2-D echo shows EF 50-55%, moderate mitral regurgitation, severe tricuspid regurgitation  - Valvular abnormalities make fluid management extremely challenging - has benefited from ultrafiltration..  6. 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 Hemoglobin back to her baseline at 7. - Hemoglobin 7.6 yesterday which is actually above her baseline.     LOS: 14 Jane Short 1/18/20171:55 PM

## 2015-06-20 LAB — HEPATIC FUNCTION PANEL
ALT: 66 U/L — ABNORMAL HIGH (ref 14–54)
AST: 59 U/L — AB (ref 15–41)
Albumin: 3.1 g/dL — ABNORMAL LOW (ref 3.5–5.0)
Alkaline Phosphatase: 122 U/L (ref 38–126)
BILIRUBIN DIRECT: 7.9 mg/dL — AB (ref 0.1–0.5)
BILIRUBIN INDIRECT: 7.7 mg/dL — AB (ref 0.3–0.9)
BILIRUBIN TOTAL: 15.6 mg/dL — AB (ref 0.3–1.2)
Total Protein: 6.5 g/dL (ref 6.5–8.1)

## 2015-06-20 LAB — BASIC METABOLIC PANEL
Anion gap: 9 (ref 5–15)
BUN: 73 mg/dL — AB (ref 6–20)
CO2: 24 mmol/L (ref 22–32)
CREATININE: 1.71 mg/dL — AB (ref 0.44–1.00)
Calcium: 10.1 mg/dL (ref 8.9–10.3)
Chloride: 103 mmol/L (ref 101–111)
GFR calc Af Amer: 35 mL/min — ABNORMAL LOW (ref >60)
GFR calc non Af Amer: 30 mL/min — ABNORMAL LOW (ref >60)
GLUCOSE: 140 mg/dL — AB (ref 65–99)
Potassium: 4.6 mmol/L (ref 3.5–5.1)
Sodium: 136 mmol/L (ref 135–145)

## 2015-06-20 LAB — DIFFERENTIAL
BASOS ABS: 0 10*3/uL (ref 0–0.1)
BLASTS: 0 %
Band Neutrophils: 0 %
Basophils Relative: 0 %
Eosinophils Absolute: 0 10*3/uL (ref 0–0.7)
Eosinophils Relative: 0 %
LYMPHS ABS: 0.5 10*3/uL — AB (ref 1.0–3.6)
Lymphocytes Relative: 3 %
METAMYELOCYTES PCT: 0 %
MONOS PCT: 9 %
MYELOCYTES: 0 %
Monocytes Absolute: 1.4 10*3/uL — ABNORMAL HIGH (ref 0.2–0.9)
NEUTROS PCT: 88 %
Neutro Abs: 13.1 10*3/uL — ABNORMAL HIGH (ref 1.4–6.5)
Other: 0 %
Promyelocytes Absolute: 0 %
nRBC: 15 /100 WBC — ABNORMAL HIGH

## 2015-06-20 LAB — CBC
HEMATOCRIT: 23.8 % — AB (ref 35.0–47.0)
Hemoglobin: 7.7 g/dL — ABNORMAL LOW (ref 12.0–16.0)
MCH: 31.4 pg (ref 26.0–34.0)
MCHC: 32.4 g/dL (ref 32.0–36.0)
MCV: 97 fL (ref 80.0–100.0)
Platelets: 118 10*3/uL — ABNORMAL LOW (ref 150–440)
RBC: 2.46 MIL/uL — ABNORMAL LOW (ref 3.80–5.20)
RDW: 23.6 % — AB (ref 11.5–14.5)
WBC: 15 10*3/uL — ABNORMAL HIGH (ref 3.6–11.0)

## 2015-06-20 MED ORDER — PREDNISONE 20 MG PO TABS
20.0000 mg | ORAL_TABLET | Freq: Every day | ORAL | Status: DC
  Administered 2015-06-21 – 2015-06-24 (×4): 20 mg via ORAL
  Filled 2015-06-20 (×4): qty 1

## 2015-06-20 MED ORDER — URSODIOL 300 MG PO CAPS
300.0000 mg | ORAL_CAPSULE | Freq: Two times a day (BID) | ORAL | Status: DC
  Administered 2015-06-20 – 2015-06-24 (×6): 300 mg via ORAL
  Filled 2015-06-20 (×9): qty 1

## 2015-06-20 NOTE — Progress Notes (Signed)
Patient has rested quietly tonight. No complaints of pain and no signs of discomfort or distress noted. One complaint of severe nausea this morning treated with PRN IV Zofran. Nursing staff will continue to monitor. Earleen Reaper, RN

## 2015-06-20 NOTE — Progress Notes (Signed)
Per Dr. Bridgett Larsson and Dr. Candace Cruise note, possibly hold hydroxyurea. Both paged and RN asked if med should be given tonight as scheduled. Dr. Bridgett Larsson deferred to Dr. Candace Cruise who asked RN to hold medication for just tonight.

## 2015-06-20 NOTE — Progress Notes (Addendum)
MD returned call, orders to place O2 2L [place] for comfort. Per patient, she does not usually have pain with sickle cell crisis, simply feels "awful and nauseous." RN requested MD come and reassess patient. RN requested prn HR orders, MD declined at this time. Real O2 placed. MD Bridgett Larsson requested Dr. Holley Raring be paged as Dr. Bridgett Larsson believes patient needs dialysis today.

## 2015-06-20 NOTE — Progress Notes (Signed)
Dr. Holley Raring paged [phone], no response yet.

## 2015-06-20 NOTE — Progress Notes (Signed)
Patient very out of breath returning from bathroom to bed and complaints of weakness, pt reports she feels like she is in a sickle cell crisis. Dr. Bridgett Larsson made aware, I reported no CBC drawn this morning, MD will place order and assess. Per Dr. Holley Raring this morning, pt not to have dialysis this morning.

## 2015-06-20 NOTE — Progress Notes (Signed)
[facility]  ROUNDING NOTE   Subjective:  Patient having diarrhea today. States that she doesn't feel as well as she did previously. Case discussed with hospitalist. BUN noted to be higher.   Objective:  Vital signs in last 24 hours:  Temp:  [97.5 F (36.4 C)-98.5 F (36.9 C)] 98.3 F (36.8 C) (01/19 1139) Pulse Rate:  [90-126] 105 (01/19 1421) Resp:  [16-30] 30 (01/19 1139) BP: (122-133)/(66-71) 133/66 mmHg (01/19 1113) SpO2:  [93 %-95 %] 93 % (01/19 1342)  Weight change:  Filed Weights   06/18/15 0950 06/18/15 1253 06/18/15 1508  Weight: 91.8 kg (202 lb 6.1 oz) 89.8 kg (197 lb 15.6 oz) 88.86 kg (195 lb 14.4 oz)    Intake/Output: I/O last 3 completed shifts: In: 1080 [P.O.:1080] Out: 0    Intake/Output this shift:     Physical Exam: General: NAD  Head: Normocephalic, atraumatic. Moist oral mucosal membranes  Eyes: scleral icterus  Neck: Supple, trachea midline  Lungs:  CTAB, normal respiratory effort   Heart: Irregular, 3/6 murmur  Abdomen:  Soft, nontender, +bowel sounds, distended  Extremities:  anasarca   Neurologic:  alert, oriented, follows commands  Skin: Ulcerated lesions bilateral lower extremities, 1+ b/l LE edema    left IJ PermCath     Basic Metabolic Panel:  Recent Labs Lab 06/15/15 0458 06/16/15 0514 06/18/15 0442 06/19/15 0905 06/20/15 0539  NA 137 136 133* 137 136  K 5.7* 4.6 4.5 4.4 4.6  CL 100* 99* 100* 104 103  CO2 _0 GLUCOSE 114* 145* 156* 156* 140*  BUN 94* 71* 83* 62* 73*  CREATININE 2.66* 2.28* 2.32* 1.70* 1.71*  CALCIUM 9.9 9.5 9.7 9.8 10.1  PHOS  --   --   --  UNABLE TO REPORT DUE TO ICTERUS  --     Liver Function Tests:  Recent Labs Lab 06/15/15 0458 06/16/15 0514 06/18/15 0442 06/19/15 0905 06/20/15 0539  AST 38 30 39  --  59*  ALT 30 29 41  --  66*  ALKPHOS 101 107 139*  --  122  BILITOT 17.5* 16.1* 12.9*  --  15.6*  PROT 6.6 6.3* 6.4*  --  6.5  ALBUMIN 2.9* 2.8* 3.0* 3.0* 3.1*    No results for input(s): LIPASE, AMYLASE in the last 168 hours. No results for input(s): AMMONIA in the last 168 hours.  CBC:  Recent Labs Lab 06/15/15 0458 06/16/15 0514 06/18/15 0442 06/20/15 0539  WBC  --  8.0 9.8 15.0*  NEUTROABS  --   --   --  13.1*  HGB 6.8* 7.7* 7.6* 7.7*  HCT  --  23.0* 23.1* 23.8*  MCV  --  95.0 96.9 97.0  PLT  --  186 160 118*    Cardiac Enzymes: No results for input(s): CKTOTAL, CKMB, CKMBINDEX, TROPONINI in the last 168 hours.  BNP: Invalid input(s): POCBNP  CBG: No results for input(s): GLUCAP in the last 168 hours.  Microbiology: Results for orders placed or performed during the hospital encounter of 06/05/15  Body fluid culture     Status: None (Preliminary result)   Collection Time: 06/18/15  2:35 PM  Result Value Ref Range Status   Specimen Description ABDOMEN  Final   Special Requests NONE  Final   Gram Stain FEW WBC SEEN NO ORGANISMS SEEN   Final   Culture NO GROWTH 2 DAYS  Final   Report Status PENDING  Incomplete    Coagulation Studies: No results for  input(s): LABPROT, INR in the last 72 hours.  Urinalysis: No results for input(s): COLORURINE, LABSPEC, PHURINE, GLUCOSEU, HGBUR, BILIRUBINUR, KETONESUR, PROTEINUR, UROBILINOGEN, NITRITE, LEUKOCYTESUR in the last 72 hours.  Invalid input(s): APPERANCEUR    Imaging: US Biopsy  06/18/2015  CLINICAL DATA:  Abnormal LFTs, end-stage renal disease, dialysis dependent, ascites EXAM: ULTRASOUND GUIDED CORE BIOPSY OF RIGHT HEPATIC LOBE MEDICATIONS: 2.0 mg IV Versed; 50 mcg IV Fentanyl Total Moderate Sedation Time: 15 PROCEDURE: The procedure, risks, benefits, and alternatives were explained to the patient. Questions regarding the procedure were encouraged and answered. The patient understands and consents to the procedure. The right upper quad was prepped with ChloraPrep in a sterile fashion, and a sterile drape was applied covering the operative field. A sterile gown and sterile  gloves were used for the procedure. Local anesthesia was provided with 1% Lidocaine. Following completion of the paracentesis yielding 700 cc peritoneal fluid, the liver was localized in the right upper quadrant in the mid axillary line through a lower intercostal space. Under sterile conditions and local anesthesia, a 17 gauge coaxial guide needle was advanced percutaneously under direct ultrasonic into the liver. Needle position confirmed with ultrasound. Images obtained for documentation. Through the access, 2 18 gauge core biopsies obtained. Samples placed in formalin. Needle tract embolized with Gel-Foam. No immediate complication. Patient tolerated the procedure well. COMPLICATIONS: None. FINDINGS: Imaging confirms needle placement in the right hepatic lobe for random core biopsy IMPRESSION: Successful ultrasound percutaneous right hepatic lobe random core biopsy Electronically Signed   By: Jerilynn Mages.  Shick M.D.   On: 06/18/2015 15:11   US Paracentesis  06/18/2015  CLINICAL DATA:  RENAL FAILURE, DIALYSIS DEPENDENT, ABNORMAL LFTS, ABDOMINAL DISTENTION AND ASCITES EXAM: ULTRASOUND GUIDED PARACENTESIS (in preparation for percutaneous ultrasound liver core biopsy and also diagnostic purposes) COMPARISON:  06/11/2015 PROCEDURE: An ultrasound guided paracentesis was thoroughly discussed with the patient and questions answered. The benefits, risks, alternatives and complications were also discussed. The patient understands and wishes to proceed with the procedure. Written consent was obtained. Ultrasound was performed to localize and mark an adequate pocket of fluid in the right upper quadrant of the abdomen. The area was then prepped and draped in the normal sterile fashion. 1% Lidocaine was used for local anesthesia. Under ultrasound guidance a 19 gauge Yueh catheter was introduced. Paracentesis was performed. The catheter was removed and a dressing applied. COMPLICATIONS: None immediate FINDINGS: A total of  approximately 700 cc of amber colored peritoneal fluid was removed. A fluid sample was sent for laboratory analysis. IMPRESSION: Successful ultrasound guided paracentesis yielding 700 cc of ascites, in preparation for percutaneous random liver biopsy to decrease bleeding complications. Electronically Signed   By: Jerilynn Mages.  Shick M.D.   On: 06/18/2015 15:08     Medications:     . allopurinol  100 mg Oral Daily  . calcitRIOL  0.25 mcg Oral Daily  . collagenase  1 application Topical Daily  . cyclobenzaprine  5 mg Oral QHS  . folic acid  1 mg Oral Daily  . heparin lock flush  500 Units Intravenous Once  . hydroxyurea  500 mg Oral Daily  . ipratropium-albuterol  3 mL Nebulization TID  . metoprolol tartrate  50 mg Oral BID  . mometasone-formoterol  2 puff Inhalation BID  . polyethylene glycol  17 g Oral BID  . predniSONE  30 mg Oral Q breakfast  . ursodiol  300 mg Oral BID     Assessment/ Plan:  [name] is a 65  y.o. 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 acute renal failure due to severe anemia, hypoxia and prerenal azotemia from poor PO intake and vomiting.  Chronic Kidney Disease is secondary to NSAID use and sickle cell anemia. Left IJ PC placed 06/13/14. - dialysis was held yesterday. We will reevaluate the patient for dialysis tomorrow. BUN is higher off of dialysis. Unclear as to whether she's reached end-stage renal disease at the moment.  2. Hyperkalemia - Potassium currently 4.6.  3. Hypertension: Blood pressure 133/66 today. Continue metoprolol.   4. Secondary Hyperparathyroidism: PTH 250. Calcium at goal during admission.   - Continue calcitriol 0.25 g by mouth daily  5.  ascites and anasarca -  low-dose Lasix infusion was ineffective and low-dose spironolactone was discontinued due to hyperkalemia - 2-D echo shows EF 50-55%, moderate mitral regurgitation, severe tricuspid regurgitation  - Valvular abnormalities make fluid management extremely challenging - has benefited from ultrafiltration.  6. 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 Hemoglobin back to her baseline at 7. - Hemoglobin has stabilized at 7.7.     LOS: [place], [place]lock 1/19/20172:29 PM

## 2015-06-20 NOTE — Consult Note (Signed)
  Diarrhea since yesterday. In isolation now. C.diff pending. Discussed with pathology late yesterday. 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 No real evidence of PBC. No there obvious causes. T.bili higher. Pt has been on hydrea for at least several years for treatment of sickle cell anemia. Would ask hematology if ok to hold hydrea for now. Will start actigall though not sure how much it will help. Thanks.

## 2015-06-20 NOTE — Progress Notes (Signed)
Patient continues to complain of 'feeling terrible,' 'feels like sickle cell crisis." HR is increased to 120s and sustaining, pt usually 80s-90s, RR increased to 30. Pt denies any pain, reports nausea is increasing again, prn zofran given. Dr. Bridgett Larsson paged x2 awaiting call back.

## 2015-06-20 NOTE — Progress Notes (Signed)
Patient ID: Jane Short, female   DOB: 1950/08/17, 65 y.o.   MRN: [id number] [facility] PROGRESS NOTE  PCP: [name], MD  HPI/Subjective: Patient had nausea and vomiting this morning. She has shortness breath and generalized weakness. She feels like she has sickle cell crisis, though she she has no body pain. She said that she never had body pain during sickle cell crisis.  Objective: Filed Vitals:   06/20/15 1139 06/20/15 1421  BP:    Pulse: 126 105  Temp: 98.3 F (36.8 C)   Resp: 30     Filed Weights   06/18/15 0950 06/18/15 1253 06/18/15 1508  Weight: 91.8 kg (202 lb 6.1 oz) 89.8 kg (197 lb 15.6 oz) 88.86 kg (195 lb 14.4 oz)    ROS: Review of Systems  Constitutional: Negative for fever and chills.  Eyes: Negative for blurred vision.  Respiratory: Positive for shortness of breath. Negative for cough.   Cardiovascular: Positive for leg swelling. Negative for chest pain.  Gastrointestinal: Positive for nausea and vomiting. Negative for abdominal pain, diarrhea and constipation.  Genitourinary: Negative for dysuria.  Musculoskeletal: Negative for joint pain.  Neurological: Positive for weakness. Negative for dizziness and headaches.   Exam: Physical Exam  Constitutional: She is oriented to person, place, and time.  HENT:  Nose: No mucosal edema.  Mouth/Throat: No oropharyngeal exudate or posterior oropharyngeal edema.  Eyes: Conjunctivae, EOM and lids are normal. Pupils are equal, round, and reactive to light. Scleral icterus is present.  Neck: No JVD present. Carotid bruit is not present. No edema present. No thyroid mass and no thyromegaly present.  Cardiovascular: S1 normal and S2 normal.  Exam reveals no gallop.   Murmur heard.  Systolic murmur is present with a grade of 2/6  Pulses:      Dorsalis pedis pulses are 2+ on the right side, and 2+ on the left side.  B/l leg edema.  Respiratory: No respiratory distress. She has  decreased breath sounds in the right lower field and the left lower field. She has no wheezes. She has no rhonchi. She has no rales.  GI: Soft. Bowel sounds are normal. There is no tenderness.  Lymphadenopathy:    She has no cervical adenopathy.  Neurological: She is alert and oriented to person, place, and time. No cranial nerve deficit.  Skin: Skin is warm. No rash noted. Nails show no clubbing.  Jaundiced  Psychiatric: She has a normal mood and affect.     Data Reviewed: Basic Metabolic Panel:  Recent Labs Lab 06/15/15 0458 06/16/15 0514 06/18/15 0442 06/19/15 0905 06/20/15 0539  NA 137 136 133* 137 136  K 5.7* 4.6 4.5 4.4 4.6  CL 100* 99* 100* 104 103  CO2 28 27 25 27 24   GLUCOSE 114* 145* 156* 156* 140*  BUN 94* 71* 83* 62* 73*  CREATININE 2.66* 2.28* 2.32* 1.70* 1.71*  CALCIUM 9.9 9.5 9.7 9.8 10.1  PHOS  --   --   --  UNABLE TO REPORT DUE TO ICTERUS  --    Liver Function Tests:  Recent Labs Lab 06/15/15 0458 06/16/15 0514 06/18/15 0442 06/19/15 0905 06/20/15 0539  AST 38 30 39  --  59*  ALT 30 29 41  --  66*  ALKPHOS 101 107 139*  --  122  BILITOT 17.5* 16.1* 12.9*  --  15.6*  PROT 6.6 6.3* 6.4*  --  6.5  ALBUMIN 2.9* 2.8* 3.0* 3.0* 3.1*   CBC:  Recent Labs Lab  06/15/15 0458 06/16/15 0514 06/18/15 0442 06/20/15 0539  WBC  --  8.0 9.8 15.0*  NEUTROABS  --   --   --  13.1*  HGB 6.8* 7.7* 7.6* 7.7*  HCT  --  23.0* 23.1* 23.8*  MCV  --  95.0 96.9 97.0  PLT  --  186 160 118*   BNP (last 3 results)  Recent Labs  06/05/15 0407  BNP 683.0*    CBG: No results for input(s): GLUCAP in the last 168 hours.    Scheduled Meds: . allopurinol  100 mg Oral Daily  . calcitRIOL  0.25 mcg Oral Daily  . collagenase  1 application Topical Daily  . cyclobenzaprine  5 mg Oral QHS  . folic acid  1 mg Oral Daily  . heparin lock flush  500 Units Intravenous Once  . hydroxyurea  500 mg Oral Daily  . ipratropium-albuterol  3 mL Nebulization TID  . metoprolol  tartrate  50 mg Oral BID  . mometasone-formoterol  2 puff Inhalation BID  . polyethylene glycol  17 g Oral BID  . predniSONE  30 mg Oral Q breakfast  . ursodiol  300 mg Oral BID    Assessment/Plan:  1. Sustained wide-complex arrhythmia which spontaneously broke. On metoprolol to 50 mg twice a day. No further arrythmia 2. Abdominal distention and swelling, shortness of breath.  likely with cirrhosis- improved after dialysis. Dialysis may be the only way to manage fluid with her liver and kidney failure. 3. Acute renal failure on chronic kidney disease with hyperkalemia- improving with HD.  dialysis was held yesterday. We will reevaluate the patient for dialysis tomorrow per Dr. Holley Raring. 4. Sickle cell disease status post 2 unit of transfusion with significant anemia. On hydroxyurea. Follow-up with hematologist.. 5.  6. Jaundice, ultrasound of the abdomen consistent with cirrhosis. The hepatitis B and hepatitis C are negative. AMA elevated at 31.1. Ceruloplasmin elevated at 53.6. She was given 3 days of oral vitamin K. S/p paracentesis (700 ml) and iver biopsy. 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 Bili was down to 12.9, but up to 15.6 today. Would ask hematology if ok to hold hydrea for now. Will start actigall per Dr. Candace Cruise.  7. Hyperlipidemia - hold Crestor 8. Essential hypertension- blood pressure on the lower side 9. History of COPD 10. Atrial fibrillation with rapid ventricular response on admission, rate controlled.  11. Acute diastolic CHF,  Echocardiogram shows EF 50-55% with moderate mitral regurgitation and severe tricuspid regurgitation. Lasix did not work well. Continue HD.  *  Leukocytosis. Unclear ideology, follow-up CBC.  Code Status:     Code Status Orders        Start     Ordered   06/05/15 1106  Full code   Continuous     06/05/15 1105    Code Status History    Date Active Date Inactive Code Status Order ID Comments User Context   This  patient has a current code status but no historical code status.     Disposition Plan: possible d/c in ? Days.   I discussed with Dr. Holley Raring.  Greater than 50% time was spent on coordination of care and face-to-face counseling. Discussed with her husband.  Time spent: 30 minutes.  Demetrios Loll  [facility]

## 2015-06-21 LAB — COMPREHENSIVE METABOLIC PANEL
ALBUMIN: 2.9 g/dL — AB (ref 3.5–5.0)
ALK PHOS: 94 U/L (ref 38–126)
ALT: 70 U/L — ABNORMAL HIGH (ref 14–54)
ANION GAP: 8 (ref 5–15)
AST: 49 U/L — ABNORMAL HIGH (ref 15–41)
BILIRUBIN TOTAL: 33 mg/dL — AB (ref 0.3–1.2)
BUN: 82 mg/dL — ABNORMAL HIGH (ref 6–20)
CALCIUM: 9.6 mg/dL (ref 8.9–10.3)
CO2: 24 mmol/L (ref 22–32)
Chloride: 98 mmol/L — ABNORMAL LOW (ref 101–111)
GLUCOSE: 98 mg/dL (ref 65–99)
Potassium: 5.4 mmol/L — ABNORMAL HIGH (ref 3.5–5.1)
Sodium: 130 mmol/L — ABNORMAL LOW (ref 135–145)
TOTAL PROTEIN: 6.3 g/dL — AB (ref 6.5–8.1)

## 2015-06-21 LAB — BASIC METABOLIC PANEL
Anion gap: 9 (ref 5–15)
BUN: 90 mg/dL — AB (ref 6–20)
CALCIUM: 9.8 mg/dL (ref 8.9–10.3)
CHLORIDE: 100 mmol/L — AB (ref 101–111)
CO2: 23 mmol/L (ref 22–32)
CREATININE: UNDETERMINED mg/dL (ref 0.44–1.00)
Glucose, Bld: 118 mg/dL — ABNORMAL HIGH (ref 65–99)
Potassium: 5.6 mmol/L — ABNORMAL HIGH (ref 3.5–5.1)
SODIUM: 132 mmol/L — AB (ref 135–145)

## 2015-06-21 LAB — C DIFFICILE QUICK SCREEN W PCR REFLEX
C DIFFICILE (CDIFF) INTERP: NEGATIVE
C Diff antigen: NEGATIVE
C Diff toxin: NEGATIVE

## 2015-06-21 LAB — CBC
HEMATOCRIT: 21.6 % — AB (ref 35.0–47.0)
HEMOGLOBIN: 7.2 g/dL — AB (ref 12.0–16.0)
MCH: 31.5 pg (ref 26.0–34.0)
MCHC: 33.5 g/dL (ref 32.0–36.0)
MCV: 94.2 fL (ref 80.0–100.0)
Platelets: 104 10*3/uL — ABNORMAL LOW (ref 150–440)
RBC: 2.29 MIL/uL — ABNORMAL LOW (ref 3.80–5.20)
RDW: 22.5 % — AB (ref 11.5–14.5)
WBC: 22.3 10*3/uL — ABNORMAL HIGH (ref 3.6–11.0)

## 2015-06-21 LAB — PROTIME-INR
INR: 1.66
PROTHROMBIN TIME: 19.6 s — AB (ref 11.4–15.0)

## 2015-06-21 NOTE — Progress Notes (Signed)
Nutrition Follow-up    INTERVENTION:   Meals and Snacks: Cater to patient preferences Medical Food Supplement Therapy: add Mighty Shakes TID with meals  NUTRITION DIAGNOSIS:   Reassess on follow-up  GOAL:   Patient will meet greater than or equal to 90% of their needs  MONITOR:    (Energy Intake, Anthropometrics, Electrolyte/Renal Profile, Digestive System)  REASON FOR ASSESSMENT:   Diagnosis    ASSESSMENT:   Pt with loose stool, C.diff negative, renal function worsening off dialysis. Pt with jaundice, cirrhosis   Diet Order:  Diet renal with fluid restriction Fluid restriction:: 1200 mL Fluid; Room service appropriate?: Yes; Fluid consistency:: Thin   Energy intake: recorded po intake 66%   Skin:  Reviewed, no issues  Electrolyte and Renal Profile:  Recent Labs Lab 06/19/15 0905 06/20/15 0539 06/21/15 0801 06/21/15 1422  BUN 62* 73* 82* 90*  CREATININE 1.70* 1.71* SEE COMMENTS UNABLE TO PERFORM DUE TOO ICTERIC INTERFERENCE  NA 137 136 130* 132*  K 4.4 4.6 5.4* 5.6*  PHOS UNABLE TO REPORT DUE TO ICTERUS  --   --   --    Glucose Profile: No results for input(s): GLUCAP in the last 72 hours.  Meds: reviewed  Height:   Ht Readings from Last 1 Encounters:  06/05/15 5\' 7"  (1.702 m)    Weight:   Wt Readings from Last 1 Encounters:  06/18/15 195 lb 14.4 oz (88.86 kg)     BMI:  Body mass index is 30.68 kg/(m^2).  Estimated Nutritional Needs:   Kcal:  1830-2135 mL (30-35 kcals/kg) using IBW 61 kg  Protein:  73-92 g (1.2-1.5 g/kg)   Fluid:  1000 ml plsu UOP  MODERATE Care Level   [facility] MS, RD, LDN [phone] Pager  [phone] Weekend/On-Call Pager

## 2015-06-21 NOTE — Progress Notes (Signed)
Patient ID: Jane Short, female   DOB: 1951-03-30, 65 y.o.   MRN: [id number] [facility] PROGRESS NOTE  PCP: [name], MD  HPI/Subjective: Patient has nausea but no vomiting this morning. Better shortness breath and generalized weakness. Objective: Filed Vitals:   06/21/15 0402 06/21/15 1134  BP: 95/53 97/68  Pulse: 95 93  Temp: 98.3 F (36.8 C) 97.5 F (36.4 C)  Resp: 22 20    Filed Weights   06/18/15 0950 06/18/15 1253 06/18/15 1508  Weight: 91.8 kg (202 lb 6.1 oz) 89.8 kg (197 lb 15.6 oz) 88.86 kg (195 lb 14.4 oz)    ROS: Review of Systems  Constitutional: Negative for fever and chills.  Eyes: Negative for blurred vision.  Respiratory: Positive for shortness of breath. Negative for cough.   Cardiovascular: Positive for leg swelling. Negative for chest pain.  Gastrointestinal: Positive for nausea. Negative for abdominal pain, diarrhea and constipation.  Genitourinary: Negative for dysuria.  Musculoskeletal: Negative for joint pain.  Neurological: Positive for weakness. Negative for dizziness and headaches.   Exam: Physical Exam  Constitutional: She is oriented to person, place, and time.  HENT:  Nose: No mucosal edema.  Mouth/Throat: No oropharyngeal exudate or posterior oropharyngeal edema.  Eyes: Conjunctivae, EOM and lids are normal. Pupils are equal, round, and reactive to light. Scleral icterus is present.  Neck: No JVD present. Carotid bruit is not present. No edema present. No thyroid mass and no thyromegaly present.  Cardiovascular: S1 normal and S2 normal.  Exam reveals no gallop.   Murmur heard.  Systolic murmur is present with a grade of 2/6  Pulses:      Dorsalis pedis pulses are 2+ on the right side, and 2+ on the left side.  B/l leg edema.  Respiratory: No respiratory distress. She has decreased breath sounds in the right lower field and the left lower field. She has no wheezes. She has no rhonchi. She has no rales.   GI: Soft. Bowel sounds are normal. There is no tenderness.  Lymphadenopathy:    She has no cervical adenopathy.  Neurological: She is alert and oriented to person, place, and time. No cranial nerve deficit.  Skin: Skin is warm. No rash noted. Nails show no clubbing.  Jaundiced  Psychiatric: She has a normal mood and affect.     Data Reviewed: Basic Metabolic Panel:  Recent Labs Lab 06/18/15 0442 06/19/15 0905 06/20/15 0539 06/21/15 0801 06/21/15 1422  NA 133* 137 136 130* 132*  K 4.5 4.4 4.6 5.4* 5.6*  CL 100* 104 103 98* 100*  CO2 25 27 24 24 23   GLUCOSE 156* 156* 140* 98 118*  BUN 83* 62* 73* 82* 90*  CREATININE 2.32* 1.70* 1.71* SEE COMMENTS UNABLE TO PERFORM DUE TOO ICTERIC INTERFERENCE  CALCIUM 9.7 9.8 10.1 9.6 9.8  PHOS  --  UNABLE TO REPORT DUE TO ICTERUS  --   --   --    Liver Function Tests:  Recent Labs Lab 06/15/15 0458 06/16/15 0514 06/18/15 0442 06/19/15 0905 06/20/15 0539 06/21/15 0801  AST 38 30 39  --  59* 49*  ALT 30 29 41  --  66* 70*  ALKPHOS 101 107 139*  --  122 94  BILITOT 17.5* 16.1* 12.9*  --  15.6* 33.0*  PROT 6.6 6.3* 6.4*  --  6.5 6.3*  ALBUMIN 2.9* 2.8* 3.0* 3.0* 3.1* 2.9*   CBC:  Recent Labs Lab 06/15/15 0458 06/16/15 0514 06/18/15 0442 06/20/15 0539 06/21/15 0801  WBC  --  8.0 9.8 15.0* 22.3*  NEUTROABS  --   --   --  13.1*  --   HGB 6.8* 7.7* 7.6* 7.7* 7.2*  HCT  --  23.0* 23.1* 23.8* 21.6*  MCV  --  95.0 96.9 97.0 94.2  PLT  --  186 160 118* 104*   BNP (last 3 results)  Recent Labs  06/05/15 0407  BNP 683.0*    CBG: No results for input(s): GLUCAP in the last 168 hours.    Scheduled Meds: . allopurinol  100 mg Oral Daily  . calcitRIOL  0.25 mcg Oral Daily  . collagenase  1 application Topical Daily  . folic acid  1 mg Oral Daily  . heparin lock flush  500 Units Intravenous Once  . ipratropium-albuterol  3 mL Nebulization TID  . metoprolol tartrate  50 mg Oral BID  . mometasone-formoterol  2 puff  Inhalation BID  . polyethylene glycol  17 g Oral BID  . predniSONE  20 mg Oral Q breakfast  . ursodiol  300 mg Oral BID    Assessment/Plan:  1. Sustained wide-complex arrhythmia which spontaneously broke. On metoprolol to 50 mg twice a day. No further arrythmia 2. Abdominal distention and swelling, shortness of breath.  likely with cirrhosis- improved after dialysis. Dialysis may be the only way to manage fluid with her liver and kidney failure. 3. Acute renal failure on chronic kidney disease with hyperkalemia- improving with HD.  dialysis was held for 2 days. But developed hyperkalemia today, Cr. Cannot be calculated, the patient's need to [place] dialysis today per Dr. Holley Raring.  4. Sickle cell disease status post 2 unit of transfusion with significant anemia. On hydroxyurea. Hb decreased to 7.2 today. Follow-up hemoglobin,  PRBC transfusion when necessary. 5.  6. Jaundice, ultrasound of the abdomen consistent with cirrhosis. The hepatitis B and hepatitis C are negative. AMA elevated at 31.1. Ceruloplasmin elevated at 53.6. She was given 3 days of oral vitamin K. S/p paracentesis (700 ml) and iver biopsy. 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 Bili was down to 12.9, but up to 33.3 today. Discontinue hydrea, started actigall per Dr. Candace Cruise.  7. Hyperlipidemia - hold Crestor 8. Essential hypertension- blood pressure on the lower side 9. History of COPD 10. Atrial fibrillation with rapid ventricular response on admission, rate controlled.  11. Acute diastolic CHF,  Echocardiogram shows EF 50-55% with moderate mitral regurgitation and severe tricuspid regurgitation. Lasix did not work well. Continue HD.  *  Leukocytosis. Unclear ideology, follow-up CBC.  Code Status:     Code Status Orders        Start     Ordered   06/05/15 1106  Full code   Continuous     06/05/15 1105    Code Status History    Date Active Date Inactive Code Status Order ID Comments User  Context   This patient has a current code status but no historical code status.     Disposition Plan: possible d/c in ? Days.   I discussed with Dr. Holley Raring.  Greater than 50% time was spent on coordination of care and face-to-face counseling. Discussed with her husband.  Time spent: 43 minutes.  Demetrios Loll  [facility]

## 2015-06-21 NOTE — Progress Notes (Signed)
[facility]  ROUNDING NOTE   Subjective:  Pt still having loose stool. Renal function worsening. BUN up to 82, Cr couldn't be calculated due to icterus.   K also up to 5.4.   Objective:  Vital signs in last 24 hours:  Temp:  [97.5 F (36.4 C)-98.3 F (36.8 C)] 97.5 F (36.4 C) (01/20 1134) Pulse Rate:  [93-97] 93 (01/20 1134) Resp:  [20-22] 20 (01/20 1134) BP: (95-110)/(53-68) 97/68 mmHg (01/20 1134) SpO2:  [93 %-100 %] 93 % (01/20 1319)  Weight change:  Filed Weights   06/18/15 0950 06/18/15 1253 06/18/15 1508  Weight: 91.8 kg (202 lb 6.1 oz) 89.8 kg (197 lb 15.6 oz) 88.86 kg (195 lb 14.4 oz)    Intake/Output:     Intake/Output this shift:  Total I/O In: 240 [P.O.:240] Out: -   Physical Exam: General: NAD  Head: Normocephalic, atraumatic. Moist oral mucosal membranes  Eyes: scleral icterus  Neck: Supple, trachea midline  Lungs:  CTAB, normal respiratory effort   Heart: Irregular, 3/6 murmur  Abdomen:  Soft, nontender, +bowel sounds, distended  Extremities:  anasarca   Neurologic:  alert, oriented, follows commands  Skin: Ulcerated lesions bilateral lower extremities, 1+ b/l LE edema    left IJ PermCath     Basic Metabolic Panel:  Recent Labs Lab 06/16/15 0514 06/18/15 0442 06/19/15 0905 06/20/15 0539 06/21/15 0801  NA 136 133* 137 136 130*  K 4.6 4.5 4.4 4.6 5.4*  CL 99* 100* 104 103 98*  CO2 _0 GLUCOSE 145* 156* 156* 140* 98  BUN 71* 83* 62* 73* 82*  CREATININE 2.28* 2.32* 1.70* 1.71* SEE COMMENTS  CALCIUM 9.5 9.7 9.8 10.1 9.6  PHOS  --   --  UNABLE TO REPORT DUE TO ICTERUS  --   --     Liver Function Tests:  Recent Labs Lab 06/15/15 0458 06/16/15 0514 06/18/15 0442 06/19/15 0905 06/20/15 0539 06/21/15 0801  AST 38 30 39  --  59* 49*  ALT 30 29 41  --  66* 70*  ALKPHOS 101 107 139*  --  122 94  BILITOT 17.5* 16.1* 12.9*  --  15.6* 33.0*  PROT 6.6 6.3* 6.4*  --  6.5 6.3*  ALBUMIN 2.9* 2.8* 3.0* 3.0* 3.1*  2.9*   No results for input(s): LIPASE, AMYLASE in the last 168 hours. No results for input(s): AMMONIA in the last 168 hours.  CBC:  Recent Labs Lab 06/15/15 0458 06/16/15 0514 06/18/15 0442 06/20/15 0539 06/21/15 0801  WBC  --  8.0 9.8 15.0* 22.3*  NEUTROABS  --   --   --  13.1*  --   HGB 6.8* 7.7* 7.6* 7.7* 7.2*  HCT  --  23.0* 23.1* 23.8* 21.6*  MCV  --  95.0 96.9 97.0 94.2  PLT  --  186 160 118* 104*    Cardiac Enzymes: No results for input(s): CKTOTAL, CKMB, CKMBINDEX, TROPONINI in the last 168 hours.  BNP: Invalid input(s): POCBNP  CBG: No results for input(s): GLUCAP in the last 168 hours.  Microbiology: Results for orders placed or performed during the hospital encounter of 06/05/15  Body fluid culture     Status: None (Preliminary result)   Collection Time: 06/18/15  2:35 PM  Result Value Ref Range Status   Specimen Description ABDOMEN  Final   Special Requests NONE  Final   Gram Stain FEW WBC SEEN NO ORGANISMS SEEN   Final   Culture NO GROWTH 3  DAYS  Final   Report Status PENDING  Incomplete  C difficile quick scan w PCR reflex     Status: None   Collection Time: 06/21/15  1:00 AM  Result Value Ref Range Status   C Diff antigen NEGATIVE NEGATIVE Final   C Diff toxin NEGATIVE NEGATIVE Final   C Diff interpretation Negative for C. difficile  Final    Coagulation Studies: No results for input(s): LABPROT, INR in the last 72 hours.  Urinalysis: No results for input(s): COLORURINE, LABSPEC, PHURINE, GLUCOSEU, HGBUR, BILIRUBINUR, KETONESUR, PROTEINUR, UROBILINOGEN, NITRITE, LEUKOCYTESUR in the last 72 hours.  Invalid input(s): APPERANCEUR    Imaging: No results found.   Medications:     . allopurinol  100 mg Oral Daily  . calcitRIOL  0.25 mcg Oral Daily  . collagenase  1 application Topical Daily  . folic acid  1 mg Oral Daily  . heparin lock flush  500 Units Intravenous Once  . ipratropium-albuterol  3 mL Nebulization TID  . metoprolol  tartrate  50 mg Oral BID  . mometasone-formoterol  2 puff Inhalation BID  . polyethylene glycol  17 g Oral BID  . predniSONE  20 mg Oral Q breakfast  . ursodiol  300 mg Oral BID     Assessment/ Plan:  [name] is a 65 y.o. black female  with sickle cell disease, atrial myxoma who was admitted on 06/05/2015 for Acute pulmonary edema ([place]) [J81.0] Hypoxia [R09.02] Atrial fibrillation with rapid ventricular response ([place]) [I48.91] Elevated bilirubin [R17] Abdominal pain [R10.9]  1. 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 acute renal failure due to severe anemia, hypoxia and prerenal azotemia from poor PO intake and vomiting.  Chronic Kidney Disease is secondary to NSAID use and sickle cell anemia. Left IJ PC placed 06/13/14. - Renal function worsening off of dialysis, therefore will plan for a session of HD today, Cr couldn't be calculate due to icterus.  Pt may have approached ESRD.    2. Hyperkalemia - Potassium up to 5.4, dialysis should help to treat this.  3. Hypertension: Blood pressure 97/68, continue metoprolol.   4. Secondary Hyperparathyroidism: PTH 250. Calcium at goal during admission.  - Continue calcitriol 0.25 g by mouth daily  5.  ascites and anasarca -  low-dose Lasix infusion was ineffective and low-dose spironolactone was discontinued due to hyperkalemia - 2-D echo shows EF 50-55%, moderate mitral regurgitation, severe tricuspid regurgitation  - Valvular abnormalities make fluid management extremely challenging - UF target 1.5kg today.  6. 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 Hemoglobin back to her baseline at 7. - Hemoglobin slightly lower today at 7.2     LOS: 16 Hagop Mccollam 1/20/20172:38 PM

## 2015-06-21 NOTE — Consult Note (Signed)
  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  Subjective: Sudden rise in T.bili today. C.diff neg. Still having diarrhea.  WBC elevated as well.  Scheduled Inpatient Medications:  . allopurinol  100 mg Oral Daily  . calcitRIOL  0.25 mcg Oral Daily  . collagenase  1 application Topical Daily  . folic acid  1 mg Oral Daily  . heparin lock flush  500 Units Intravenous Once  . ipratropium-albuterol  3 mL Nebulization TID  . metoprolol tartrate  50 mg Oral BID  . mometasone-formoterol  2 puff Inhalation BID  . polyethylene glycol  17 g Oral BID  . predniSONE  20 mg Oral Q breakfast  . ursodiol  300 mg Oral BID    Continuous Inpatient Infusions:     PRN Inpatient Medications:  sodium chloride, HYDROcodone-acetaminophen, iohexol, morphine injection, ondansetron **OR** ondansetron (ZOFRAN) IV, pentafluoroprop-tetrafluoroeth, sodium chloride  Review of Systems: Constitutional: Weight is stable.  Eyes: No changes in vision. ENT: No oral lesions, sore throat.  GI: see HPI.  Heme/Lymph: No easy bruising.  CV: No chest pain.  GU: No hematuria.  Integumentary: No rashes.  Neuro: No headaches.  Psych: No depression/anxiety.  Endocrine: No heat/cold intolerance.  Allergic/Immunologic: No urticaria.  Resp: No cough, SOB.  Musculoskeletal: No joint swelling.    Physical Examination: BP 97/68 mmHg  Pulse 93  Temp(Src) 97.5 F (36.4 C) (Oral)  Resp 20  Ht 5\' 7"  (1.702 m)  Wt 88.86 kg (195 lb 14.4 oz)  BMI 30.68 kg/m2  SpO2 93% Gen: NAD, alert and oriented x 4 HEENT: PEERLA, EOMI, Neck: supple, no JVD or thyromegaly Chest: CTA bilaterally, no wheezes, crackles, or other adventitious sounds CV: RRR, no m/g/c/r Abd: soft, NT, mild distension, +BS in all four quadrants; no HSM, guarding, ridigity, or rebound tenderness Ext: no edema, well perfused with 2+ pulses, Skin: no rash or lesions noted Lymph: no  LAD  Data: Lab Results  Component Value Date   WBC 22.3* 06/21/2015   HGB 7.2* 06/21/2015   HCT 21.6* 06/21/2015   MCV 94.2 06/21/2015   PLT 104* 06/21/2015    Recent Labs Lab 06/18/15 0442 06/20/15 0539 06/21/15 0801  HGB 7.6* 7.7* 7.2*   Lab Results  Component Value Date   NA 130* 06/21/2015   K 5.4* 06/21/2015   CL 98* 06/21/2015   CO2 24 06/21/2015   BUN 82* 06/21/2015   CREATININE SEE COMMENTS 06/21/2015   GLU 89 02/20/2010   Lab Results  Component Value Date   ALT 70* 06/21/2015   AST 49* 06/21/2015   ALKPHOS 94 06/21/2015   BILITOT 33.0* 06/21/2015    Recent Labs Lab 06/14/15 1557  06/17/15 1408  APTT 26  --   --   INR 1.40  < > 1.34  < > = values in this interval not displayed. 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  Recommendations: Stopped hydrea. Also, stopped other potential hepatotoxic drugs. Started actigall as well, in case this helps. Continue to moniter LFT as well as INR. If keeps rising, then would transfer pt to a tertiary center. Thanks. Please call with questions or concerns.  Tequila Rottmann, Lupita Dawn, MD

## 2015-06-21 NOTE — Care Management Note (Signed)
Case Management Note  Patient Details  Name: Jane Short MRN: [id number] Date of Birth: Mar 24, 1951  Subjective/Objective:    Iran Sizer was updated that per Dr Elwyn Lade note Ms Hesterberg may be approaching ESRD. Still unsure whether Ms Ludlam will require permanent dialysis.              Action/Plan:   Expected Discharge Date:                  Expected Discharge Plan:  Home/Self Care  In-House Referral:     Discharge planning Services  CM Consult  Post Acute Care Choice:    Choice offered to:     DME Arranged:    DME Agency:     HH Arranged:  Patient Refused HH Agency:     Status of Service:  Completed, signed off  Medicare Important Message Given:    Date Medicare IM Given:    Medicare IM give by:    Date Additional Medicare IM Given:    Additional Medicare Important Message give by:     If discussed at [place] of Stay Meetings, dates discussed:    Additional Comments:  Tiarah Shisler A, RN 06/21/2015, 3:31 PM

## 2015-06-22 ENCOUNTER — Inpatient Hospital Stay: Payer: BLUE CROSS/BLUE SHIELD

## 2015-06-22 LAB — BODY FLUID CULTURE: Culture: NO GROWTH

## 2015-06-22 LAB — BASIC METABOLIC PANEL
Anion gap: 9 (ref 5–15)
BUN: 56 mg/dL — AB (ref 6–20)
CHLORIDE: 101 mmol/L (ref 101–111)
CO2: 26 mmol/L (ref 22–32)
CREATININE: UNDETERMINED mg/dL (ref 0.44–1.00)
Calcium: 9.7 mg/dL (ref 8.9–10.3)
Glucose, Bld: 91 mg/dL (ref 65–99)
Potassium: 4.7 mmol/L (ref 3.5–5.1)
SODIUM: 136 mmol/L (ref 135–145)

## 2015-06-22 LAB — PROTIME-INR
INR: 1.38
Prothrombin Time: 17.1 seconds — ABNORMAL HIGH (ref 11.4–15.0)

## 2015-06-22 NOTE — Progress Notes (Signed)
Patient back from Dialysis.

## 2015-06-22 NOTE — Progress Notes (Signed)
[facility]  ROUNDING NOTE   Subjective:  Patient had dialysis yesterday. No new renal function testing today.    Objective:  Vital signs in last 24 hours:  Temp:  [98.1 F (36.7 C)-100 F (37.8 C)] 100 F (37.8 C) (01/21 1105) Pulse Rate:  [58-114] 58 (01/21 1105) Resp:  [16-24] 24 (01/21 1105) BP: (85-117)/(50-70) 108/50 mmHg (01/21 1105) SpO2:  [80 %-94 %] 80 % (01/21 1105) Weight:  [86.8 kg (191 lb 5.8 oz)-87.68 kg (193 lb 4.8 oz)] 87.68 kg (193 lb 4.8 oz) (01/20 2139)  Weight change:  Filed Weights   06/18/15 1508 06/21/15 1830 06/21/15 2139  Weight: 88.86 kg (195 lb 14.4 oz) 86.8 kg (191 lb 5.8 oz) 87.68 kg (193 lb 4.8 oz)    Intake/Output: I/O last 3 completed shifts: In: 240 [P.O.:240] Out: 768 [Other:768]   Intake/Output this shift:  Total I/O In: 480 [P.O.:480] Out: 0   Physical Exam: General: NAD  Head: Normocephalic, atraumatic. Moist oral mucosal membranes  Eyes: scleral icterus  Neck: Supple, trachea midline  Lungs:  CTAB, normal respiratory effort   Heart: Irregular, 3/6 murmur  Abdomen:  Soft, nontender, +bowel sounds, distended  Extremities:  anasarca   Neurologic:  alert, oriented, follows commands  Skin: Ulcerated lesions bilateral lower extremities, 1+ b/l LE edema    left IJ PermCath     Basic Metabolic Panel:  Recent Labs Lab 06/18/15 0442 06/19/15 0905 06/20/15 0539 06/21/15 0801 06/21/15 1422  NA 133* 137 136 130* 132*  K 4.5 4.4 4.6 5.4* 5.6*  CL 100* 104 103 98* 100*  CO2 25 27 24 24 23   GLUCOSE 156* 156* 140* 98 118*  BUN 83* 62* 73* 82* 90*  CREATININE 2.32* 1.70* 1.71* SEE COMMENTS UNABLE TO PERFORM DUE TOO ICTERIC INTERFERENCE  CALCIUM 9.7 9.8 10.1 9.6 9.8  PHOS  --  UNABLE TO REPORT DUE TO ICTERUS  --   --   --     Liver Function Tests:  Recent Labs Lab 06/16/15 0514 06/18/15 0442 06/19/15 0905 06/20/15 0539 06/21/15 0801  AST 30 39  --  59* 49*  ALT 29 41  --  66* 70*  ALKPHOS 107 139*  --   122 94  BILITOT 16.1* 12.9*  --  15.6* 33.0*  PROT 6.3* 6.4*  --  6.5 6.3*  ALBUMIN 2.8* 3.0* 3.0* 3.1* 2.9*   No results for input(s): LIPASE, AMYLASE in the last 168 hours. No results for input(s): AMMONIA in the last 168 hours.  CBC:  Recent Labs Lab 06/16/15 0514 06/18/15 0442 06/20/15 0539 06/21/15 0801  WBC 8.0 9.8 15.0* 22.3*  NEUTROABS  --   --  13.1*  --   HGB 7.7* 7.6* 7.7* 7.2*  HCT 23.0* 23.1* 23.8* 21.6*  MCV 95.0 96.9 97.0 94.2  PLT 186 160 118* 104*    Cardiac Enzymes: No results for input(s): CKTOTAL, CKMB, CKMBINDEX, TROPONINI in the last 168 hours.  BNP: Invalid input(s): POCBNP  CBG: No results for input(s): GLUCAP in the last 168 hours.  Microbiology: Results for orders placed or performed during the hospital encounter of 06/05/15  Body fluid culture     Status: None   Collection Time: 06/18/15  2:35 PM  Result Value Ref Range Status   Specimen Description ABDOMEN  Final   Special Requests NONE  Final   Gram Stain FEW WBC SEEN NO ORGANISMS SEEN   Final   Culture No growth aerobically or anaerobically.  Final   Report  Status 06/22/2015 FINAL  Final  C difficile quick scan w PCR reflex     Status: None   Collection Time: 06/21/15  1:00 AM  Result Value Ref Range Status   C Diff antigen NEGATIVE NEGATIVE Final   C Diff toxin NEGATIVE NEGATIVE Final   C Diff interpretation Negative for C. difficile  Final    Coagulation Studies:  Recent Labs  06/21/15 1838 06/22/15 0535  LABPROT 19.6* 17.1*  INR 1.66 1.38    Urinalysis: No results for input(s): COLORURINE, LABSPEC, PHURINE, GLUCOSEU, HGBUR, BILIRUBINUR, KETONESUR, PROTEINUR, UROBILINOGEN, NITRITE, LEUKOCYTESUR in the last 72 hours.  Invalid input(s): APPERANCEUR    Imaging: No results found.   Medications:     . allopurinol  100 mg Oral Daily  . calcitRIOL  0.25 mcg Oral Daily  . collagenase  1 application Topical Daily  . folic acid  1 mg Oral Daily  . heparin lock flush   500 Units Intravenous Once  . ipratropium-albuterol  3 mL Nebulization TID  . metoprolol tartrate  50 mg Oral BID  . mometasone-formoterol  2 puff Inhalation BID  . predniSONE  20 mg Oral Q breakfast  . ursodiol  300 mg Oral BID     Assessment/ Plan:  [name] is a 65 y.o. black female  with sickle cell disease, atrial myxoma who was admitted on 06/05/2015 for Acute pulmonary edema ([place]) [J81.0] Hypoxia [R09.02] Atrial fibrillation with rapid ventricular response ([place]) [I48.91] Elevated bilirubin [R17] Abdominal pain [R10.9]  1. 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 acute renal failure due to severe anemia, hypoxia and prerenal azotemia from poor PO intake and vomiting.  Chronic Kidney Disease is secondary to NSAID use and sickle cell anemia. Left IJ PC placed 06/13/14. - patient had dialysis yesterday. No acute indication for dialysis today. Consider dialysis again on Monday. The patient may have underlying end-stage renal disease however this is difficult to determine given her very low creatinine which may be secondary to low muscle mass.   2. Hyperkalemia - Potassium was 5.6 yesterday. Recheck renal function testing today.  3. Hypertension: Blood pressure 108/50, continue metoprolol.   4. Secondary Hyperparathyroidism: PTH 250. Calcium at goal during admission.  - Continue calcitriol 0.25 g by mouth daily  5.  ascites and anasarca -  low-dose Lasix infusion was ineffective and low-dose spironolactone was discontinued due to hyperkalemia - 2-D echo shows EF 50-55%, moderate mitral regurgitation, severe tricuspid regurgitation  - Valvular abnormalities make fluid management extremely challenging - Continue ultrafiltration with dialysis sessions.  6. 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 Hemoglobin back to her  baseline at 7. - No new hemoglobin today however most recent hemoglobin was 7.2 which is essentially her baseline.     LOS: 17 Jane Short 1/21/20171:30 PM

## 2015-06-22 NOTE — Progress Notes (Signed)
Patient ID: Jane Short, female   DOB: 1950/12/16, 65 y.o.   MRN: [id number] [facility] PROGRESS NOTE  Jane Short [id number] DOB: 10/23/1950 DOA: 06/05/2015 PCP: [name], MD  HPI/Subjective: Patient wasn't feeling well yesterday. She had a lot of diarrhea. Now has a low-grade fever. No cough.  Objective: Filed Vitals:   06/22/15 0939 06/22/15 1105  BP: 102/51 108/50  Pulse: 114 58  Temp: 99.9 F (37.7 C) 100 F (37.8 C)  Resp: 16 24    Filed Weights   06/18/15 1508 06/21/15 1830 06/21/15 2139  Weight: 88.86 kg (195 lb 14.4 oz) 86.8 kg (191 lb 5.8 oz) 87.68 kg (193 lb 4.8 oz)    ROS: Review of Systems  Constitutional: Negative for fever and chills.  Eyes: Negative for blurred vision.  Respiratory: Positive for cough and shortness of breath.   Cardiovascular: Negative for chest pain.  Gastrointestinal: Positive for abdominal pain and diarrhea. Negative for nausea, vomiting and constipation.  Genitourinary: Negative for dysuria.  Musculoskeletal: Negative for joint pain.  Neurological: Negative for dizziness and headaches.   Exam: Physical Exam  Constitutional: She is oriented to person, place, and time.  HENT:  Nose: No mucosal edema.  Mouth/Throat: No oropharyngeal exudate or posterior oropharyngeal edema.  Eyes: Conjunctivae, EOM and lids are normal. Pupils are equal, round, and reactive to light.  Neck: No JVD present. Carotid bruit is not present. No edema present. No thyroid mass and no thyromegaly present.  Cardiovascular: S1 normal and S2 normal.  Exam reveals no gallop.   No murmur heard. Pulses:      Dorsalis pedis pulses are 2+ on the right side, and 2+ on the left side.  Respiratory: No respiratory distress. She has no wheezes. She has rhonchi in the right lower field and the left lower field. She has no rales.  GI: Soft. Bowel sounds are normal. She exhibits distension and fluid wave. There is tenderness in the  epigastric area.  Musculoskeletal:       Right shoulder: She exhibits no swelling.       Right ankle: She exhibits swelling.       Left ankle: She exhibits swelling.  Lymphadenopathy:    She has no cervical adenopathy.  Neurological: She is alert and oriented to person, place, and time. No cranial nerve deficit.  Skin: Skin is warm. Nails show no clubbing.  Jaundiced  Psychiatric: She has a normal mood and affect.      Data Reviewed: Basic Metabolic Panel:  Recent Labs Lab 06/19/15 0905 06/20/15 0539 06/21/15 0801 06/21/15 1422 06/22/15 0535  NA 137 136 130* 132* 136  K 4.4 4.6 5.4* 5.6* 4.7  CL 104 103 98* 100* 101  CO2 27 24 24 23 26   GLUCOSE 156* 140* 98 118* 91  BUN 62* 73* 82* 90* 56*  CREATININE 1.70* 1.71* SEE COMMENTS UNABLE TO PERFORM DUE TOO ICTERIC INTERFERENCE UNABLE TO PERFORM DUE TO ICTERUS  CALCIUM 9.8 10.1 9.6 9.8 9.7  PHOS UNABLE TO REPORT DUE TO ICTERUS  --   --   --   --    Liver Function Tests:  Recent Labs Lab 06/16/15 0514 06/18/15 0442 06/19/15 0905 06/20/15 0539 06/21/15 0801  AST 30 39  --  59* 49*  ALT 29 41  --  66* 70*  ALKPHOS 107 139*  --  122 94  BILITOT 16.1* 12.9*  --  15.6* 33.0*  PROT 6.3* 6.4*  --  6.5 6.3*  ALBUMIN 2.8*  3.0* 3.0* 3.1* 2.9*   CBC:  Recent Labs Lab 06/16/15 0514 06/18/15 0442 06/20/15 0539 06/21/15 0801  WBC 8.0 9.8 15.0* 22.3*  NEUTROABS  --   --  13.1*  --   HGB 7.7* 7.6* 7.7* 7.2*  HCT 23.0* 23.1* 23.8* 21.6*  MCV 95.0 96.9 97.0 94.2  PLT 186 160 118* 104*   BNP (last 3 results)  Recent Labs  06/05/15 0407  BNP 683.0*    Recent Results (from the past 240 hour(s))  Body fluid culture     Status: None   Collection Time: 06/18/15  2:35 PM  Result Value Ref Range Status   Specimen Description ABDOMEN  Final   Special Requests NONE  Final   Gram Stain FEW WBC SEEN NO ORGANISMS SEEN   Final   Culture No growth aerobically or anaerobically.  Final   Report Status 06/22/2015 FINAL  Final   C difficile quick scan w PCR reflex     Status: None   Collection Time: 06/21/15  1:00 AM  Result Value Ref Range Status   C Diff antigen NEGATIVE NEGATIVE Final   C Diff toxin NEGATIVE NEGATIVE Final   C Diff interpretation Negative for C. difficile  Final     Scheduled Meds: . allopurinol  100 mg Oral Daily  . calcitRIOL  0.25 mcg Oral Daily  . collagenase  1 application Topical Daily  . folic acid  1 mg Oral Daily  . heparin lock flush  500 Units Intravenous Once  . ipratropium-albuterol  3 mL Nebulization TID  . metoprolol tartrate  50 mg Oral BID  . mometasone-formoterol  2 puff Inhalation BID  . predniSONE  20 mg Oral Q breakfast  . ursodiol  300 mg Oral BID   Assessment/Plan:  1. Fever, leukocytosis as per yesterday. Get blood cultures 2, chest x-ray, stool culture. Stool for C. difficile already negative. 2. Acute diastolic congestive heart failure with moderate mitral regurgitation and severe tricuspid regurgitation. Hemodialysis to manage fluid. 3. Abdominal swelling and distention and shortness of breath likely with heart failure and cirrhosis. Dialysis to manage fluid. Liver biopsy still pending. 4. Acute renal failure on chronic kidney disease with hyperkalemia. Hemodialysis to manage. 5. Sickle cell disease status post 2 units of transfusion. Hydroxyurea stop by gastroenterology 6. Jaundice, ultrasound of the abdomen consistent with cirrhosis. Liver biopsy still pending. Dr. Candace Cruise stopped hydroxyurea and started Actigall 7. Hyperlipidemia hold Crestor 8. A wide complex tachycardia which spontaneously broke patient on metoprolol 25 twice a day 9. History of COPD.  10. Acute respiratory failure now on oxygen 11. Atrial fibrillation rapid ventricular response on admission now rate controlled. 12. Diarrhea- stop MiraLAX and send stool studies.  Code Status:     Code Status Orders        Start     Ordered   06/05/15 1106  Full code   Continuous     06/05/15 1105     Code Status History    Date Active Date Inactive Code Status Order ID Comments User Context   This patient has a current code status but no historical code status.     Family Communication: Husband at the bedside Disposition Plan: To be determined  Consultants:  Gastroenterology  Nephrology  Cardiology  Procedures:  Dialysis catheter placement  Time spent: 28 minutes  Loletha Grayer  [facility]

## 2015-06-23 LAB — COMPREHENSIVE METABOLIC PANEL
ALBUMIN: 2.6 g/dL — AB (ref 3.5–5.0)
ALT: 40 U/L (ref 14–54)
AST: 25 U/L (ref 15–41)
Alkaline Phosphatase: 80 U/L (ref 38–126)
Anion gap: 10 (ref 5–15)
BUN: 57 mg/dL — AB (ref 6–20)
CHLORIDE: 98 mmol/L — AB (ref 101–111)
CO2: 28 mmol/L (ref 22–32)
Calcium: 9.6 mg/dL (ref 8.9–10.3)
Creatinine, Ser: UNDETERMINED mg/dL (ref 0.44–1.00)
GLUCOSE: 129 mg/dL — AB (ref 65–99)
POTASSIUM: 4.3 mmol/L (ref 3.5–5.1)
SODIUM: 136 mmol/L (ref 135–145)
Total Bilirubin: 41.1 mg/dL (ref 0.3–1.2)
Total Protein: 5.8 g/dL — ABNORMAL LOW (ref 6.5–8.1)

## 2015-06-23 LAB — PROTIME-INR
INR: 1.26
PROTHROMBIN TIME: 15.9 s — AB (ref 11.4–15.0)

## 2015-06-23 LAB — BLOOD CULTURE ID PANEL (REFLEXED)
ACINETOBACTER BAUMANNII: NOT DETECTED
CANDIDA GLABRATA: NOT DETECTED
CANDIDA KRUSEI: NOT DETECTED
CANDIDA PARAPSILOSIS: NOT DETECTED
CARBAPENEM RESISTANCE: NOT DETECTED
Candida albicans: NOT DETECTED
Candida tropicalis: NOT DETECTED
ENTEROBACTER CLOACAE COMPLEX: NOT DETECTED
ESCHERICHIA COLI: NOT DETECTED
Enterobacteriaceae species: NOT DETECTED
Enterococcus species: DETECTED — AB
Haemophilus influenzae: NOT DETECTED
KLEBSIELLA OXYTOCA: NOT DETECTED
Klebsiella pneumoniae: NOT DETECTED
LISTERIA MONOCYTOGENES: NOT DETECTED
METHICILLIN RESISTANCE: NOT DETECTED
Neisseria meningitidis: NOT DETECTED
PROTEUS SPECIES: NOT DETECTED
Pseudomonas aeruginosa: NOT DETECTED
SERRATIA MARCESCENS: NOT DETECTED
STREPTOCOCCUS PNEUMONIAE: NOT DETECTED
STREPTOCOCCUS PYOGENES: NOT DETECTED
STREPTOCOCCUS SPECIES: NOT DETECTED
Staphylococcus aureus (BCID): NOT DETECTED
Staphylococcus species: NOT DETECTED
Streptococcus agalactiae: NOT DETECTED
Vancomycin resistance: NOT DETECTED

## 2015-06-23 LAB — BILIRUBIN, DIRECT: Bilirubin, Direct: 25.9 mg/dL — ABNORMAL HIGH (ref 0.1–0.5)

## 2015-06-23 MED ORDER — LEVOFLOXACIN IN D5W 500 MG/100ML IV SOLN
500.0000 mg | INTRAVENOUS | Status: DC
Start: 2015-06-23 — End: 2015-06-23
  Administered 2015-06-23: 500 mg via INTRAVENOUS
  Filled 2015-06-23: qty 100

## 2015-06-23 MED ORDER — PREDNISONE 20 MG PO TABS: 20.0000 mg | ORAL_TABLET | Freq: Every day | ORAL | Status: DC

## 2015-06-23 MED ORDER — HYDROCODONE-ACETAMINOPHEN 5-325 MG PO TABS: 1.0000 | ORAL_TABLET | Freq: Four times a day (QID) | ORAL | Status: DC | PRN

## 2015-06-23 MED ORDER — VANCOMYCIN HCL IN DEXTROSE 1-5 GM/200ML-% IV SOLN
1000.0000 mg | Freq: Once | INTRAVENOUS | Status: AC
  Administered 2015-06-23: 1000 mg via INTRAVENOUS
  Filled 2015-06-23: qty 200

## 2015-06-23 MED ORDER — VANCOMYCIN HCL IN DEXTROSE 1-5 GM/200ML-% IV SOLN
1000.0000 mg | Freq: Once | INTRAVENOUS | Status: AC
  Administered 2015-06-23: 1000 mg via INTRAVENOUS
  Filled 2015-06-23 (×2): qty 200

## 2015-06-23 MED ORDER — METOPROLOL TARTRATE 25 MG PO TABS
25.0000 mg | ORAL_TABLET | Freq: Two times a day (BID) | ORAL | Status: DC
  Administered 2015-06-23 – 2015-06-24 (×2): 25 mg via ORAL
  Filled 2015-06-23 (×3): qty 1

## 2015-06-23 MED ORDER — METOPROLOL TARTRATE 25 MG PO TABS: 25.0000 mg | ORAL_TABLET | Freq: Two times a day (BID) | ORAL | Status: DC

## 2015-06-23 MED ORDER — URSODIOL 300 MG PO CAPS: 300.0000 mg | ORAL_CAPSULE | Freq: Two times a day (BID) | ORAL | Status: DC

## 2015-06-23 MED ORDER — VANCOMYCIN HCL IN DEXTROSE 750-5 MG/150ML-% IV SOLN
750.0000 mg | INTRAVENOUS | Status: DC | PRN
  Administered 2015-06-24: 750 mg via INTRAVENOUS
  Filled 2015-06-23 (×3): qty 150

## 2015-06-23 MED ORDER — VANCOMYCIN HCL IN DEXTROSE 1-5 GM/200ML-% IV SOLN: INTRAVENOUS | Status: DC

## 2015-06-23 NOTE — Discharge Summary (Addendum)
[place] [facility] [place] NAME: Jane Short    MR#:  [id number]  DATE OF BIRTH:  1951/05/17  DATE OF ADMISSION:  06/05/2015 ADMITTING PHYSICIAN: Gladstone Lighter, MD  DATE OF DISCHARGE: Potential 06/24/2015 to [place]: [name], MD    ADMISSION DIAGNOSIS:  Acute pulmonary edema ([place]) [J81.0] Hypoxia [R09.02] Atrial fibrillation with rapid ventricular response (HCC) [I48.91] Elevated bilirubin [R17] Abdominal pain [R10.9]  DISCHARGE DIAGNOSIS:  Active Problems:   CHF (congestive heart failure) (HCC)   Elevated bilirubin   Sickle-cell/Hb-C disease with crisis ([place])   SECONDARY DIAGNOSIS:   Past Medical History  Diagnosis Date  . Sickle cell anemia (HCC)     sickle cell disease  . Vitamin D deficiency   . Hypertension   . Osteoporosis 2011    osteopenia  . 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 Cheree Ditto at [facility]  . Gout   . Sickle cell anemia (HCC)   . Lichen planus   . SCA-1 (spinocerebellar ataxia type 1) ([place])   . COPD (chronic obstructive pulmonary disease) (HCC)     symptoms Dr. Patricia Pesa   . CHF (congestive heart failure) ([place])     HOSPITAL COURSE:   1. Acute diastolic congestive heart failure with moderate mitral regurgitation and severe tricuspid regurgitation. We initially tried Lasix and Aldactone to help manage fluid. The patient had hyperkalemia and Aldactone was removed. We tried Lasix drip to remove fluid which did not work. Dialysis helped out with managing fluid. 2. Abdominal swelling and distention and shortness of breath likely due to heart failure and possible cirrhosis. Dialysis to manage fluid. Liver biopsy still pending. 3. Fever and leukocytosis on 06/21/2014. Blood cultures were drawn and were positive for enterococcus species. Patient was given a dose of Levaquin. I will switch antibiotics to vancomycin until sensitivities come back. 4. Acute renal failure  on chronic kidney disease with hyperkalemia. Patient was started on hemodialysis and catheter was placed in the left chest. 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  Transfer to tertiary care center. 5. Jaundice, ultrasound of the abdomen consistent with cirrhosis. Liver biopsy still pending. Dr. Candace Cruise stop hydroxyurea and started Actigall. Dr. Jenetta Downer recommended transfer to a tertiary care center since the bilirubin still continues to increase. Bilirubin today up to 41.1. Patient does not complain of itching. 6. Hyperlipidemia hold Crestor 7. Wide-complex tachycardia which spontaneously broke. Since the blood pressure on the lower side I will decrease metoprolol 25 mg twice a day. Patient seen in consultation by cardiology. 8. Acute respiratory failure now on oxygen with low pulse ox on room air. 9. History of COPD 10. Atrial fibrillation with rapid ventricular response now rate controlled on metoprolol. 11. Diarrhea now resolved after stopping MiraLAX.  DISCHARGE CONDITIONS:   fair  CONSULTS OBTAINED:  Treatment Team:  Teodoro Spray, MD Lavonia Dana, MD Evlyn Kanner, NP Katha Cabal, MD  DRUG ALLERGIES:   Allergies  Allergen Reactions  . Ramipril     cough    DISCHARGE MEDICATIONS:   Current Discharge Medication List    START taking these medications   Details  HYDROcodone-acetaminophen (NORCO/VICODIN) 5-325 MG tablet Take 1 tablet by mouth every 6 (six) hours as needed for moderate pain. Qty: 30 tablet, Refills: 0    metoprolol tartrate (LOPRESSOR) 25 MG tablet Take 1 tablet (25 mg total) by mouth 2 (two) times daily.  predniSONE (DELTASONE) 20 MG tablet Take 1 tablet (20 mg total) by mouth daily with breakfast.    ursodiol (ACTIGALL) 300 MG capsule Take 1 capsule (300 mg total) by mouth 2 (two) times daily.    vancomycin (VANCOCIN) 1 GM/200ML SOLN Pharmacy dosing Qty: 4000 mL      CONTINUE these medications which  have NOT CHANGED   Details  albuterol (PROVENTIL HFA;VENTOLIN HFA) 108 (90 BASE) MCG/ACT inhaler Inhale 2 puffs into the lungs every 6 (six) hours as needed. Qty: 18 g, Refills: 6   Associated Diagnoses: Dyspnea    allopurinol (ZYLOPRIM) 100 MG tablet Take 100 mg by mouth daily.     calcitRIOL (ROCALTROL) 0.25 MCG capsule Take 0.25 mcg by mouth daily.    folic acid (FOLVITE) 1 MG tablet Take 1 mg by mouth daily.    mometasone-formoterol (DULERA) 200-5 MCG/ACT AERO Inhale 2 puffs into the lungs 2 (two) times daily. Qty: 1 Inhaler, Refills: 12   Associated Diagnoses: SOB (shortness of breath)    SANTYL ointment Apply 1 application topically daily as needed.    Umeclidinium Bromide (INCRUSE ELLIPTA) 62.5 MCG/INH AEPB Inhale 1 puff into the lungs daily. Qty: 30 each, Refills: 5    silver sulfADIAZINE (SILVADENE) 1 % cream Apply 1 application topically 2 (two) times daily. Qty: 50 g, Refills: 0      STOP taking these medications     cyclobenzaprine (FLEXERIL) 5 MG tablet      furosemide (LASIX) 20 MG tablet      hydroxyurea (HYDREA) 500 MG capsule      losartan (COZAAR) 50 MG tablet      rosuvastatin (CRESTOR) 5 MG tablet          DISCHARGE INSTRUCTIONS:   Follow-up with Dr. at [facility] one day  If you experience worsening of your admission symptoms, develop shortness of breath, life threatening emergency, suicidal or homicidal thoughts you must seek medical attention immediately by calling 911 or calling your MD immediately  if symptoms less severe.  You Must read complete instructions/literature along with all the possible adverse reactions/side effects for all the Medicines you take and that have been prescribed to you. Take any new Medicines after you have completely understood and accept all the possible adverse reactions/side effects.   Please note  You were cared for by a hospitalist during your hospital stay. If you have any questions about your discharge medications  or the care you received while you were in the hospital after you are discharged, you can call the unit and asked to speak with the hospitalist on call if the hospitalist that took care of you is not available. Once you are discharged, your primary care physician will handle any further medical issues. Please note that NO REFILLS for any discharge medications will be authorized once you are discharged, as it is imperative that you return to your primary care physician (or establish a relationship with a primary care physician if you do not have one) for your aftercare needs so that they can reassess your need for medications and monitor your lab values.    Today   CHIEF COMPLAINT:   Chief Complaint  Patient presents with  . Emesis    HISTORY OF PRESENT ILLNESS:  Jane Short  is a 65 y.o. female presented with vomiting. Patient found to be acute hypoxic respiratory failure secondary to pulmonary edema and rapid atrial fibrillation.   VITAL SIGNS:  Blood pressure 94/56, pulse 105, temperature 98.7 F (37.1 C), temperature  source Oral, resp. rate 20, height 5\' 7"  (1.702 m), weight 87.68 kg (193 lb 4.8 oz), SpO2 88 %.    PHYSICAL EXAMINATION:  GENERAL:  65 y.o.-year-old patient lying in the bed with no acute distress.  EYES: Pupils equal, round, reactive to light and accommodation.  positive for scleral icterus. Extraocular muscles intact.  HEENT: Head atraumatic, normocephalic. Oropharynx and nasopharynx clear.  NECK:  Supple, no jugular venous distention. No thyroid enlargement, no tenderness.  LUNGS: decreased breath sounds bilaterally, no wheezing, positive rhonchi at bilateral bases. No use of accessory muscles of respiration.  CARDIOVASCULAR: S1, S2 irregular irregular tachycardic. 3/6 systolic  murmurs, no rubs, or gallops.  ABDOMEN: Soft, non-tender, distended. Bowel sounds present. No organomegaly or mass.  EXTREMITIES: trace edema, no cyanosis, or clubbing.  NEUROLOGIC:  Cranial nerves II through XII are intact. Muscle strength 5/5 in all extremities. Sensation intact. Gait not checked.  PSYCHIATRIC: The patient is alert and oriented x 3.  SKIN:  Jaundiced. Chronic ulcer  left lower extremity  DATA REVIEW:   CBC  Recent Labs Lab 06/21/15 0801  WBC 22.3*  HGB 7.2*  HCT 21.6*  PLT 104*    Chemistries   Recent Labs Lab 06/23/15 0513  NA 136  K 4.3  CL 98*  CO2 28  GLUCOSE 129*  BUN 57*  CREATININE UNABLE TO REPORT DUE TO ICTERUS  CALCIUM 9.6  AST 25  ALT 40  ALKPHOS 80  BILITOT 41.1*    Cardiac Enzymes No results for input(s): TROPONINI in the last 168 hours.  Microbiology Results  Results for orders placed or performed during the hospital encounter of 06/05/15  Body fluid culture     Status: None   Collection Time: 06/18/15  2:35 PM  Result Value Ref Range Status   Specimen Description ABDOMEN  Final   Special Requests NONE  Final   Gram Stain FEW WBC SEEN NO ORGANISMS SEEN   Final   Culture No growth aerobically or anaerobically.  Final   Report Status 06/22/2015 FINAL  Final  C difficile quick scan w PCR reflex     Status: None   Collection Time: 06/21/15  1:00 AM  Result Value Ref Range Status   C Diff antigen NEGATIVE NEGATIVE Final   C Diff toxin NEGATIVE NEGATIVE Final   C Diff interpretation Negative for C. difficile  Final  CULTURE, BLOOD (ROUTINE X 2) w Reflex to PCR ID Panel     Status: None (Preliminary result)   Collection Time: 06/22/15 12:10 PM  Result Value Ref Range Status   Specimen Description BLOOD LEFT ANTECUBITAL  Final   Special Requests BOTTLES DRAWN AEROBIC AND ANAEROBIC  3CC  Final   Culture  Setup Time   Final    GRAM POSITIVE COCCI IN BOTH AEROBIC AND ANAEROBIC BOTTLES CRITICAL RESULT CALLED TO, READ BACK BY AND VERIFIED WITH: NATE COOKSON AT [place] 06/23/15.PMH CONFIRMED BY RWW    Culture   Final    GRAM POSITIVE COCCI IN BOTH AEROBIC AND ANAEROBIC BOTTLES    Report Status PENDING   Incomplete  CULTURE, BLOOD (ROUTINE X 2) w Reflex to PCR ID Panel     Status: None (Preliminary result)   Collection Time: 06/22/15 12:21 PM  Result Value Ref Range Status   Specimen Description BLOOD RIGHT ARM  Final   Special Requests BOTTLES DRAWN AEROBIC AND ANAEROBIC  [place]  Final   Culture  Setup Time   Final    GRAM POSITIVE COCCI IN BOTH  AEROBIC AND ANAEROBIC BOTTLES CRITICAL RESULT CALLED TO, READ BACK BY AND VERIFIED WITH: NATE COOKSON AT [place] 06/23/15.PMH CONFIRMED BY RWW    Culture   Final    ENTEROCOCCUS SPECIES IN BOTH AEROBIC AND ANAEROBIC BOTTLES IDENTIFICATION AND SUSCEPTIBILITIES TO FOLLOW    Report Status PENDING  Incomplete  Blood Culture ID Panel (Reflexed)     Status: Abnormal   Collection Time: 06/22/15 12:21 PM  Result Value Ref Range Status   Enterococcus species DETECTED (A) NOT DETECTED Final    Comment: CRITICAL RESULT CALLED TO, READ BACK BY AND VERIFIED WITH: NATE COOKSON AT 0330 06/23/15.PMH    Listeria monocytogenes NOT DETECTED NOT DETECTED Final   Staphylococcus species NOT DETECTED NOT DETECTED Final   Staphylococcus aureus NOT DETECTED NOT DETECTED Final   Streptococcus species NOT DETECTED NOT DETECTED Final   Streptococcus agalactiae NOT DETECTED NOT DETECTED Final   Streptococcus pneumoniae NOT DETECTED NOT DETECTED Final   Streptococcus pyogenes NOT DETECTED NOT DETECTED Final   Acinetobacter baumannii NOT DETECTED NOT DETECTED Final   Enterobacteriaceae species NOT DETECTED NOT DETECTED Final   Enterobacter cloacae complex NOT DETECTED NOT DETECTED Final   Escherichia coli NOT DETECTED NOT DETECTED Final   Klebsiella oxytoca NOT DETECTED NOT DETECTED Final   Klebsiella pneumoniae NOT DETECTED NOT DETECTED Final   Proteus species NOT DETECTED NOT DETECTED Final   Serratia marcescens NOT DETECTED NOT DETECTED Final   Haemophilus influenzae NOT DETECTED NOT DETECTED Final   Neisseria meningitidis NOT DETECTED NOT DETECTED Final   Pseudomonas  aeruginosa NOT DETECTED NOT DETECTED Final   Candida albicans NOT DETECTED NOT DETECTED Final   Candida glabrata NOT DETECTED NOT DETECTED Final   Candida krusei NOT DETECTED NOT DETECTED Final   Candida parapsilosis NOT DETECTED NOT DETECTED Final   Candida tropicalis NOT DETECTED NOT DETECTED Final   Carbapenem resistance NOT DETECTED NOT DETECTED Final   Methicillin resistance NOT DETECTED NOT DETECTED Final   Vancomycin resistance NOT DETECTED NOT DETECTED Final    RADIOLOGY:  Dg Chest 1 View  06/22/2015  CLINICAL DATA:  New onset of fever, the patient had dialysis yesterday EXAM: CHEST 1 VIEW COMPARISON:  06/09/2015 FINDINGS: Cardiomegaly is noted. Central mild vascular congestion. Stable right IJ catheter with tip in upper SVC. There is a left IJ dialysis catheter with tip in SVC right atrium junction. No pneumothorax. No pulmonary edema. No segmental infiltrate. Patchy mild basilar atelectasis or infiltrate with improvement in aeration from prior exam. Stable calcification in left atrial appendage. IMPRESSION: Stable right IJ catheter with tip in upper SVC. There is a left IJ dialysis catheter with tip in SVC right atrium junction. No pneumothorax. No pulmonary edema. No segmental infiltrate. Patchy mild basilar atelectasis or infiltrate with improvement in aeration from prior exam. Electronically Signed   By: Lahoma Crocker M.D.   On: 06/22/2015 17:31    Management plans discussed with the patient, family and they are in agreement.  CODE STATUS:     Code Status Orders        Start     Ordered   06/05/15 1106  Full code   Continuous     06/05/15 1105    Code Status History    Date Active Date Inactive Code Status Order ID Comments User Context   This patient has a current code status but no historical code status.      TOTAL TIME TAKING CARE OF THIS PATIENT: 60 minutes.    Loletha Grayer M.D on  06/23/2015 at 2:40 PM  Between 7am to 6pm - Pager - [phone]  After 6pm  go to [email or web address] - password [email or web address] [facility]  Office  [phone]  CC: Primary care physician; [name], MD

## 2015-06-23 NOTE — Progress Notes (Addendum)
ANTIBIOTIC CONSULT NOTE - INITIAL  Pharmacy Consult for Vancomycin  Indication: rule out sepsis  Allergies  Allergen Reactions  . Ramipril     cough    Patient Measurements: Height: 5\' 7"  (170.2 cm) Weight: 193 lb 4.8 oz (87.68 kg) IBW/kg (Calculated) : 61.6 Adjusted Body Weight: 72.04 kg   Vital Signs: Temp: 98.7 F (37.1 C) (01/22 1135) Temp Source: Oral (01/22 1135) BP: 94/56 mmHg (01/22 1135) Pulse Rate: 105 (01/22 1135) Intake/Output from previous day: 01/21 0701 - 01/22 0700 In: 680 [P.O.:480; IV Piggyback:200] Out: 0  Intake/Output from this shift:    Labs:  Recent Labs  06/21/15 0801 06/21/15 1422 06/22/15 0535 06/23/15 0513  WBC 22.3*  --   --   --   HGB 7.2*  --   --   --   PLT 104*  --   --   --   CREATININE SEE COMMENTS UNABLE TO PERFORM DUE TOO ICTERIC INTERFERENCE UNABLE TO PERFORM DUE TO ICTERUS UNABLE TO REPORT DUE TO ICTERUS   CrCl cannot be calculated (Patient has no serum creatinine result on file.). No results for input(s): VANCOTROUGH, VANCOPEAK, VANCORANDOM, GENTTROUGH, GENTPEAK, GENTRANDOM, TOBRATROUGH, TOBRAPEAK, TOBRARND, AMIKACINPEAK, AMIKACINTROU, AMIKACIN in the last 72 hours.   Microbiology: Recent Results (from the past 720 hour(s))  Body fluid culture     Status: None   Collection Time: 06/18/15  2:35 PM  Result Value Ref Range Status   Specimen Description ABDOMEN  Final   Special Requests NONE  Final   Gram Stain FEW WBC SEEN NO ORGANISMS SEEN   Final   Culture No growth aerobically or anaerobically.  Final   Report Status 06/22/2015 FINAL  Final  C difficile quick scan w PCR reflex     Status: None   Collection Time: 06/21/15  1:00 AM  Result Value Ref Range Status   C Diff antigen NEGATIVE NEGATIVE Final   C Diff toxin NEGATIVE NEGATIVE Final   C Diff interpretation Negative for C. difficile  Final  CULTURE, BLOOD (ROUTINE X 2) w Reflex to PCR ID Panel     Status: None (Preliminary result)   Collection Time: 06/22/15  12:10 PM  Result Value Ref Range Status   Specimen Description BLOOD LEFT ANTECUBITAL  Final   Special Requests BOTTLES DRAWN AEROBIC AND ANAEROBIC  3CC  Final   Culture  Setup Time   Final    GRAM POSITIVE COCCI IN BOTH AEROBIC AND ANAEROBIC BOTTLES CRITICAL RESULT CALLED TO, READ BACK BY AND VERIFIED WITH: NATE COOKSON AT [place] 06/23/15.PMH CONFIRMED BY RWW    Culture   Final    GRAM POSITIVE COCCI IN BOTH AEROBIC AND ANAEROBIC BOTTLES    Report Status PENDING  Incomplete  CULTURE, BLOOD (ROUTINE X 2) w Reflex to PCR ID Panel     Status: None (Preliminary result)   Collection Time: 06/22/15 12:21 PM  Result Value Ref Range Status   Specimen Description BLOOD RIGHT ARM  Final   Special Requests BOTTLES DRAWN AEROBIC AND ANAEROBIC  [place]  Final   Culture  Setup Time   Final    GRAM POSITIVE COCCI IN BOTH AEROBIC AND ANAEROBIC BOTTLES CRITICAL RESULT CALLED TO, READ BACK BY AND VERIFIED WITH: NATE COOKSON AT [place] 06/23/15.PMH CONFIRMED BY RWW    Culture   Final    ENTEROCOCCUS SPECIES IN BOTH AEROBIC AND ANAEROBIC BOTTLES IDENTIFICATION AND SUSCEPTIBILITIES TO FOLLOW    Report Status PENDING  Incomplete  Blood Culture ID Panel (Reflexed)  Status: Abnormal   Collection Time: 06/22/15 12:21 PM  Result Value Ref Range Status   Enterococcus species DETECTED (A) NOT DETECTED Final    Comment: CRITICAL RESULT CALLED TO, READ BACK BY AND VERIFIED WITH: NATE COOKSON AT 0330 06/23/15.PMH    Listeria monocytogenes NOT DETECTED NOT DETECTED Final   Staphylococcus species NOT DETECTED NOT DETECTED Final   Staphylococcus aureus NOT DETECTED NOT DETECTED Final   Streptococcus species NOT DETECTED NOT DETECTED Final   Streptococcus agalactiae NOT DETECTED NOT DETECTED Final   Streptococcus pneumoniae NOT DETECTED NOT DETECTED Final   Streptococcus pyogenes NOT DETECTED NOT DETECTED Final   Acinetobacter baumannii NOT DETECTED NOT DETECTED Final   Enterobacteriaceae species NOT DETECTED  NOT DETECTED Final   Enterobacter cloacae complex NOT DETECTED NOT DETECTED Final   Escherichia coli NOT DETECTED NOT DETECTED Final   Klebsiella oxytoca NOT DETECTED NOT DETECTED Final   Klebsiella pneumoniae NOT DETECTED NOT DETECTED Final   Proteus species NOT DETECTED NOT DETECTED Final   Serratia marcescens NOT DETECTED NOT DETECTED Final   Haemophilus influenzae NOT DETECTED NOT DETECTED Final   Neisseria meningitidis NOT DETECTED NOT DETECTED Final   Pseudomonas aeruginosa NOT DETECTED NOT DETECTED Final   Candida albicans NOT DETECTED NOT DETECTED Final   Candida glabrata NOT DETECTED NOT DETECTED Final   Candida krusei NOT DETECTED NOT DETECTED Final   Candida parapsilosis NOT DETECTED NOT DETECTED Final   Candida tropicalis NOT DETECTED NOT DETECTED Final   Carbapenem resistance NOT DETECTED NOT DETECTED Final   Methicillin resistance NOT DETECTED NOT DETECTED Final   Vancomycin resistance NOT DETECTED NOT DETECTED Final    Medical History: Past Medical History  Diagnosis Date  . Sickle cell anemia (HCC)     sickle cell disease  . Vitamin D deficiency   . Hypertension   . Osteoporosis 2011    osteopenia  . 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 Cheree Ditto at [facility]  . Gout   . Sickle cell anemia (HCC)   . Lichen planus   . SCA-1 (spinocerebellar ataxia type 1) ([place])   . COPD (chronic obstructive pulmonary disease) (HCC)     symptoms Dr. Patricia Pesa   . CHF (congestive heart failure) (HCC)     Medications:  Scheduled:  . allopurinol  100 mg Oral Daily  . calcitRIOL  0.25 mcg Oral Daily  . collagenase  1 application Topical Daily  . folic acid  1 mg Oral Daily  . heparin lock flush  500 Units Intravenous Once  . ipratropium-albuterol  3 mL Nebulization TID  . metoprolol tartrate  25 mg Oral BID  . mometasone-formoterol  2 puff Inhalation BID  . predniSONE  20 mg Oral Q breakfast  . ursodiol  300 mg Oral BID  . vancomycin  1,000 mg Intravenous Once    Assessment: Pharmacy consulted to dose Vancomycin in this 65 year old female with CKD, icterus, CHF, now growing Enterococcus in blood cultures.  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  Lab is unable to provide accurate SrCr due to icterus.  Pt receiving intermittent HD on a PRN basis.  Last HD on 1/21.     Goal of Therapy:  Vancomycin trough level 15-20 mcg/ml  Plan:  Expected duration 7 days with resolution of temperature and/or normalization of WBC  Vancomycin 1 gm IV X 1 given on 1/22 @ 6:00. Vancomycin 1 gm IV X 1 given on 1/22 @ 14:00.   Will  not order additional Vancomycin as loading dose at this time.   Will order Vancomycin 750 mg IV to be given at the end of each HD session PRN.    As this pt is receiving HD intermittently , will need to follow up to see if regular HD schedule is established and order Vanc trough accordingly.   No trough currently ordered.   Pt may be transferred to [facility].    Andrina Locken D 06/23/2015,2:56 PM

## 2015-06-23 NOTE — Progress Notes (Addendum)
Pharmacy Antibiotic Follow-up Note  Jane Short is a 65 y.o. year-old female admitted on 06/05/2015.    Assessment/Plan: Lab called to report positive blood culture in both aerobic and anaerobic bottles 2 of 2 sets, GPC enterococcus, VRE gene not detected. SCr has not been reported for 2 days due to assay interference/icterus. Spoke with lab who will follow up in AM to see if they can clarify SCr. Dr. Camillia Herter one dose of vancomycin tonight.  Temp (24hrs), Avg:98.4 F (36.9 C), Min:98 F (36.7 C), Max:98.7 F (37.1 C)   Recent Labs Lab 06/18/15 0442 06/20/15 0539 06/21/15 0801  WBC 9.8 15.0* 22.3*     Recent Labs Lab 06/20/15 0539 06/21/15 0801 06/21/15 1422 06/22/15 0535 06/23/15 0513  CREATININE 1.71* SEE COMMENTS UNABLE TO PERFORM DUE TOO ICTERIC INTERFERENCE UNABLE TO PERFORM DUE TO ICTERUS UNABLE TO REPORT DUE TO ICTERUS   CrCl cannot be calculated (Patient has no serum creatinine result on file.).    Allergies  Allergen Reactions  . Ramipril     cough   Plan: Patient received Vancomycin 1 gm IV ~ 06:00.  Lab called to give [place] an "off the record"  SCr = 1.48.  Based on this level, would dose as Vancomycin 1g IV Q18H.  Ordered Vancomycin 1gm IV x 1 dose for 1/22 at 14:00 (this would be a stacked dose).   Random Vancomycin level is ordered for 1/23 at 07:30.  SCr ordered with am labs.    Pharmacy to follow and adjust dose as appropriate.   Olivia Canter, [place] Clinical Pharmacist 06/23/2015 1:43 PM

## 2015-06-23 NOTE — Consult Note (Signed)
  GI Inpatient Follow-up Note  Patient Identification: Jane Short is a 65 y.o. female  With cholestatic hepatitis.  Subjective: T. Bili continues to rise. Diarrhea stopped. INR remains stable. Now with blood sepsis. Surprisingly, looks relatively stable. Scheduled Inpatient Medications:  . allopurinol  100 mg Oral Daily  . calcitRIOL  0.25 mcg Oral Daily  . collagenase  1 application Topical Daily  . folic acid  1 mg Oral Daily  . heparin lock flush  500 Units Intravenous Once  . ipratropium-albuterol  3 mL Nebulization TID  . levofloxacin (LEVAQUIN) IV  500 mg Intravenous Q24H  . metoprolol tartrate  50 mg Oral BID  . mometasone-formoterol  2 puff Inhalation BID  . predniSONE  20 mg Oral Q breakfast  . ursodiol  300 mg Oral BID    Continuous Inpatient Infusions:     PRN Inpatient Medications:  sodium chloride, HYDROcodone-acetaminophen, iohexol, morphine injection, ondansetron **OR** ondansetron (ZOFRAN) IV, pentafluoroprop-tetrafluoroeth, sodium chloride  Review of Systems: Constitutional: Weight is stable.  Eyes: No changes in vision. ENT: No oral lesions, sore throat.  GI: see HPI.  Heme/Lymph: No easy bruising.  CV: No chest pain.  GU: No hematuria.  Integumentary: No rashes.  Neuro: No headaches.  Psych: No depression/anxiety.  Endocrine: No heat/cold intolerance.  Allergic/Immunologic: No urticaria.  Resp: No cough, SOB.  Musculoskeletal: No joint swelling.    Physical Examination: BP 100/67 mmHg  Pulse 89  Temp(Src) 98 F (36.7 C) (Oral)  Resp 24  Ht 5\' 7"  (1.702 m)  Wt 87.68 kg (193 lb 4.8 oz)  BMI 30.27 kg/m2  SpO2 96% Gen: NAD, alert and oriented x 4 HEENT: PEERLA, EOMI, Neck: supple, no JVD or thyromegaly Chest: CTA bilaterally, no wheezes, crackles, or other adventitious sounds CV: RRR, no m/g/c/r Abd: soft, NT, ND, +BS in all four quadrants; no HSM, guarding, ridigity, or rebound tenderness Ext: no edema, well perfused with 2+  pulses, Skin: no rash or lesions noted Lymph: no LAD  Data: Lab Results  Component Value Date   WBC 22.3* 06/21/2015   HGB 7.2* 06/21/2015   HCT 21.6* 06/21/2015   MCV 94.2 06/21/2015   PLT 104* 06/21/2015    Recent Labs Lab 06/18/15 0442 06/20/15 0539 06/21/15 0801  HGB 7.6* 7.7* 7.2*   Lab Results  Component Value Date   NA 136 06/23/2015   K 4.3 06/23/2015   CL 98* 06/23/2015   CO2 28 06/23/2015   BUN 57* 06/23/2015   CREATININE UNABLE TO REPORT DUE TO ICTERUS 06/23/2015   GLU 89 02/20/2010   Lab Results  Component Value Date   ALT 40 06/23/2015   AST 25 06/23/2015   ALKPHOS 80 06/23/2015   BILITOT 41.1* 06/23/2015    Recent Labs Lab 06/23/15 0513  INR 1.26   Assessment/Plan: Ms. Mccaa is a 65 y.o. female with cholestatic hepatitis that is worsening, despite stopping potentially hepatotoxic agents and starting actigall. Question if current sepsis is contributing to worsening LFT. Not sure what role sickle cell disease is contributing to her overall liver abnormalities.  Recommendations: Will order direct bilirubin. Recommend pt be transferred to a tertiary liver center.  Please call with questions or concerns.  Addisson Frate, Lupita Dawn, MD

## 2015-06-23 NOTE — Progress Notes (Signed)
Patient ID: Jane Short, female   DOB: 18-Oct-1950, 65 y.o.   MRN: [id number] [facility] PROGRESS NOTE  Jane Short [id number] DOB: 20-Jan-1951 DOA: 06/05/2015 PCP: [name], MD  HPI/Subjective: Patient still not feeling great. But feels a little bit better. No further fever. Still with some cough and shortness of breath. Patient has some abdominal pain. No further diarrhea.  Objective: Filed Vitals:   06/23/15 0745 06/23/15 1135  BP: 97/70 94/56  Pulse: 95 105  Temp: 98.7 F (37.1 C) 98.7 F (37.1 C)  Resp:  20    Filed Weights   06/18/15 1508 06/21/15 1830 06/21/15 2139  Weight: 88.86 kg (195 lb 14.4 oz) 86.8 kg (191 lb 5.8 oz) 87.68 kg (193 lb 4.8 oz)    ROS: Review of Systems  Constitutional: Negative for fever and chills.  Eyes: Negative for blurred vision.  Respiratory: Positive for cough and shortness of breath.   Cardiovascular: Negative for chest pain.  Gastrointestinal: Positive for abdominal pain. Negative for nausea, vomiting, diarrhea and constipation.  Genitourinary: Negative for dysuria.  Musculoskeletal: Negative for joint pain.  Neurological: Negative for dizziness and headaches.   Exam: Physical Exam  Constitutional: She is oriented to person, place, and time.  HENT:  Nose: No mucosal edema.  Mouth/Throat: No oropharyngeal exudate or posterior oropharyngeal edema.  Eyes: Conjunctivae, EOM and lids are normal. Pupils are equal, round, and reactive to light.  Neck: No JVD present. Carotid bruit is not present. No edema present. No thyroid mass and no thyromegaly present.  Cardiovascular: S1 normal and S2 normal.  Exam reveals no gallop.   No murmur heard. Pulses:      Dorsalis pedis pulses are 2+ on the right side, and 2+ on the left side.  Respiratory: No respiratory distress. She has no wheezes. She has rhonchi in the right lower field and the left lower field. She has no rales.  GI: Soft. Bowel sounds are  normal. She exhibits distension and fluid wave. There is tenderness in the epigastric area.  Musculoskeletal:       Right shoulder: She exhibits no swelling.       Right ankle: She exhibits swelling.       Left ankle: She exhibits swelling.  Lymphadenopathy:    She has no cervical adenopathy.  Neurological: She is alert and oriented to person, place, and time. No cranial nerve deficit.  Skin: Skin is warm. Nails show no clubbing.  Jaundiced  Psychiatric: She has a normal mood and affect.      Data Reviewed: Basic Metabolic Panel:  Recent Labs Lab 06/19/15 0905 06/20/15 0539 06/21/15 0801 06/21/15 1422 06/22/15 0535 06/23/15 0513  NA 137 136 130* 132* 136 136  K 4.4 4.6 5.4* 5.6* 4.7 4.3  CL 104 103 98* 100* 101 98*  CO2 27 24 24 23 26 28   GLUCOSE 156* 140* 98 118* 91 129*  BUN 62* 73* 82* 90* 56* 57*  CREATININE 1.70* 1.71* SEE COMMENTS UNABLE TO PERFORM DUE TOO ICTERIC INTERFERENCE UNABLE TO PERFORM DUE TO ICTERUS UNABLE TO REPORT DUE TO ICTERUS  CALCIUM 9.8 10.1 9.6 9.8 9.7 9.6  PHOS UNABLE TO REPORT DUE TO ICTERUS  --   --   --   --   --    Liver Function Tests:  Recent Labs Lab 06/18/15 0442 06/19/15 0905 06/20/15 0539 06/21/15 0801 06/23/15 0513  AST 39  --  59* 49* 25  ALT 41  --  66* 70* 40  ALKPHOS 139*  --  122 94 80  BILITOT 12.9*  --  15.6* 33.0* 41.1*  PROT 6.4*  --  6.5 6.3* 5.8*  ALBUMIN 3.0* 3.0* 3.1* 2.9* 2.6*   CBC:  Recent Labs Lab 06/18/15 0442 06/20/15 0539 06/21/15 0801  WBC 9.8 15.0* 22.3*  NEUTROABS  --  13.1*  --   HGB 7.6* 7.7* 7.2*  HCT 23.1* 23.8* 21.6*  MCV 96.9 97.0 94.2  PLT 160 118* 104*   BNP (last 3 results)  Recent Labs  06/05/15 0407  BNP 683.0*    Recent Results (from the past 240 hour(s))  Body fluid culture     Status: None   Collection Time: 06/18/15  2:35 PM  Result Value Ref Range Status   Specimen Description ABDOMEN  Final   Special Requests NONE  Final   Gram Stain FEW WBC SEEN NO ORGANISMS  SEEN   Final   Culture No growth aerobically or anaerobically.  Final   Report Status 06/22/2015 FINAL  Final  C difficile quick scan w PCR reflex     Status: None   Collection Time: 06/21/15  1:00 AM  Result Value Ref Range Status   C Diff antigen NEGATIVE NEGATIVE Final   C Diff toxin NEGATIVE NEGATIVE Final   C Diff interpretation Negative for C. difficile  Final  CULTURE, BLOOD (ROUTINE X 2) w Reflex to PCR ID Panel     Status: None (Preliminary result)   Collection Time: 06/22/15 12:10 PM  Result Value Ref Range Status   Specimen Description BLOOD LEFT ANTECUBITAL  Final   Special Requests BOTTLES DRAWN AEROBIC AND ANAEROBIC  3CC  Final   Culture  Setup Time   Final    GRAM POSITIVE COCCI IN BOTH AEROBIC AND ANAEROBIC BOTTLES CRITICAL RESULT CALLED TO, READ BACK BY AND VERIFIED WITH: NATE COOKSON AT [place] 06/23/15.PMH CONFIRMED BY RWW    Culture   Final    GRAM POSITIVE COCCI IN BOTH AEROBIC AND ANAEROBIC BOTTLES    Report Status PENDING  Incomplete  CULTURE, BLOOD (ROUTINE X 2) w Reflex to PCR ID Panel     Status: None (Preliminary result)   Collection Time: 06/22/15 12:21 PM  Result Value Ref Range Status   Specimen Description BLOOD RIGHT ARM  Final   Special Requests BOTTLES DRAWN AEROBIC AND ANAEROBIC  [place]  Final   Culture  Setup Time   Final    GRAM POSITIVE COCCI IN BOTH AEROBIC AND ANAEROBIC BOTTLES CRITICAL RESULT CALLED TO, READ BACK BY AND VERIFIED WITH: NATE COOKSON AT [place] 06/23/15.PMH CONFIRMED BY RWW    Culture   Final    ENTEROCOCCUS SPECIES IN BOTH AEROBIC AND ANAEROBIC BOTTLES IDENTIFICATION AND SUSCEPTIBILITIES TO FOLLOW    Report Status PENDING  Incomplete  Blood Culture ID Panel (Reflexed)     Status: Abnormal   Collection Time: 06/22/15 12:21 PM  Result Value Ref Range Status   Enterococcus species DETECTED (A) NOT DETECTED Final    Comment: CRITICAL RESULT CALLED TO, READ BACK BY AND VERIFIED WITH: NATE COOKSON AT 0330 06/23/15.PMH    Listeria  monocytogenes NOT DETECTED NOT DETECTED Final   Staphylococcus species NOT DETECTED NOT DETECTED Final   Staphylococcus aureus NOT DETECTED NOT DETECTED Final   Streptococcus species NOT DETECTED NOT DETECTED Final   Streptococcus agalactiae NOT DETECTED NOT DETECTED Final   Streptococcus pneumoniae NOT DETECTED NOT DETECTED Final   Streptococcus pyogenes NOT DETECTED NOT DETECTED Final   Acinetobacter baumannii NOT DETECTED  NOT DETECTED Final   Enterobacteriaceae species NOT DETECTED NOT DETECTED Final   Enterobacter cloacae complex NOT DETECTED NOT DETECTED Final   Escherichia coli NOT DETECTED NOT DETECTED Final   Klebsiella oxytoca NOT DETECTED NOT DETECTED Final   Klebsiella pneumoniae NOT DETECTED NOT DETECTED Final   Proteus species NOT DETECTED NOT DETECTED Final   Serratia marcescens NOT DETECTED NOT DETECTED Final   Haemophilus influenzae NOT DETECTED NOT DETECTED Final   Neisseria meningitidis NOT DETECTED NOT DETECTED Final   Pseudomonas aeruginosa NOT DETECTED NOT DETECTED Final   Candida albicans NOT DETECTED NOT DETECTED Final   Candida glabrata NOT DETECTED NOT DETECTED Final   Candida krusei NOT DETECTED NOT DETECTED Final   Candida parapsilosis NOT DETECTED NOT DETECTED Final   Candida tropicalis NOT DETECTED NOT DETECTED Final   Carbapenem resistance NOT DETECTED NOT DETECTED Final   Methicillin resistance NOT DETECTED NOT DETECTED Final   Vancomycin resistance NOT DETECTED NOT DETECTED Final     Scheduled Meds: . allopurinol  100 mg Oral Daily  . calcitRIOL  0.25 mcg Oral Daily  . collagenase  1 application Topical Daily  . folic acid  1 mg Oral Daily  . heparin lock flush  500 Units Intravenous Once  . ipratropium-albuterol  3 mL Nebulization TID  . levofloxacin (LEVAQUIN) IV  500 mg Intravenous Q24H  . metoprolol tartrate  50 mg Oral BID  . mometasone-formoterol  2 puff Inhalation BID  . predniSONE  20 mg Oral Q breakfast  . ursodiol  300 mg Oral BID  .  vancomycin  1,000 mg Intravenous Once   Assessment/Plan:  1. Fever, leukocytosis. Enterococcus species and blood cultures. Start vancomycin. Patient given a dose of Levaquin earlier. I will discontinue the Levaquin. 2. Acute diastolic congestive heart failure with moderate mitral regurgitation and severe tricuspid regurgitation. Hemodialysis to manage fluid. 3. Abdominal swelling and distention and shortness of breath likely with heart failure and cirrhosis. Dialysis to manage fluid. Liver biopsy still pending. 4. Acute renal failure on chronic kidney disease with hyperkalemia. Hemodialysis to manage. 5. Sickle cell disease status post 2 units of transfusion. Hydroxyurea stopped by gastroenterology 6. Jaundice, ultrasound of the abdomen consistent with cirrhosis. Liver biopsy still pending. Dr. Candace Cruise stopped hydroxyurea and started Actigall. Case discussed with Dr. Jenetta Downer and recommended transfer to a tertiary care center since the bilirubin still continues to increase. He recommends a liver specialist to see the patient. 7. Hyperlipidemia hold Crestor 8. A wide complex tachycardia which spontaneously broke. Since blood pressure on the lower side decrease the metoprolol to 25 mg twice a day again 9. History of COPD.  10. Acute respiratory failure now on oxygen 11. Atrial fibrillation rapid ventricular response on admission now rate controlled. 12. Diarrhea- resolved after stopping MiraLAX.  Code Status:     Code Status Orders        Start     Ordered   06/05/15 1106  Full code   Continuous     06/05/15 1105    Code Status History    Date Active Date Inactive Code Status Order ID Comments User Context   This patient has a current code status but no historical code status.     Family Communication: Husband at the bedside Disposition Plan: Transfer to [facility] when bed [facility]  Consultants:  Gastroenterology  Nephrology  Cardiology  Procedures:  Dialysis catheter placement  Time  spent: 35 minutes  Loletha Grayer  [facility]

## 2015-06-23 NOTE — Progress Notes (Signed)
Pharmacy Antibiotic Follow-up Note  Jane Short is a 65 y.o. year-old female admitted on 06/05/2015.    Assessment/Plan: Lab called to report positive blood culture in both aerobic and anaerobic bottles 2 of 2 sets, GPC enterococcus, VRE gene not detected. SCr has not been reported for 2 days due to assay interference/icterus. Spoke with lab who will follow up in AM to see if they can clarify SCr. Dr. Camillia Herter one dose of vancomycin tonight.  Temp (24hrs), Avg:99.1 F (37.3 C), Min:98.1 F (36.7 C), Max:100 F (37.8 C)   Recent Labs Lab 06/16/15 0514 06/18/15 0442 06/20/15 0539 06/21/15 0801  WBC 8.0 9.8 15.0* 22.3*    Recent Labs Lab 06/19/15 0905 06/20/15 0539 06/21/15 0801 06/21/15 1422 06/22/15 0535  CREATININE 1.70* 1.71* SEE COMMENTS UNABLE TO PERFORM DUE TOO ICTERIC INTERFERENCE UNABLE TO PERFORM DUE TO ICTERUS   CrCl cannot be calculated (Patient has no serum creatinine result on file.).    Allergies  Allergen Reactions  . Ramipril     cough   Plan: Vancomycin 1 gm IV x 1. Will f/u AM to see if lab can clarify SCr.  Thank you for allowing pharmacy to be a part of this patient's care.  Laural Benes, Pharm.D., BCPS Clinical Pharmacist 06/23/2015 3:48 AM

## 2015-06-23 NOTE — Progress Notes (Signed)
[facility]  ROUNDING NOTE   Subjective:  Bilirubin continues to rise. Patient resting comfortably in bed. Had hemodialysis yesterday. She has required periodic dialysis treatments.   Objective:  Vital signs in last 24 hours:  Temp:  [98 F (36.7 C)-98.7 F (37.1 C)] 98.7 F (37.1 C) (01/22 1135) Pulse Rate:  [89-105] 105 (01/22 1135) Resp:  [20-24] 20 (01/22 1135) BP: (94-104)/(56-70) 94/56 mmHg (01/22 1135) SpO2:  [88 %-100 %] 88 % (01/22 1135)  Weight change:  Filed Weights   06/18/15 1508 06/21/15 1830 06/21/15 2139  Weight: 88.86 kg (195 lb 14.4 oz) 86.8 kg (191 lb 5.8 oz) 87.68 kg (193 lb 4.8 oz)    Intake/Output: I/O last 3 completed shifts: In: 680 [P.O.:480; IV Piggyback:200] Out: 768 [Other:768]   Intake/Output this shift:     Physical Exam: General: NAD  Head: Normocephalic, atraumatic. Moist oral mucosal membranes  Eyes: scleral icterus  Neck: Supple, trachea midline  Lungs:  CTAB, normal respiratory effort   Heart: Irregular, 3/6 murmur  Abdomen:  Soft, nontender, +bowel sounds, distended  Extremities:  anasarca   Neurologic:  alert, oriented, follows commands  Skin: Ulcerated lesions bilateral lower extremities, 1+ b/l LE edema  Access:  left IJ PermCath     Basic Metabolic Panel:  Recent Labs Lab 06/19/15 0905 06/20/15 0539 06/21/15 0801 06/21/15 1422 06/22/15 0535 06/23/15 0513  NA 137 136 130* 132* 136 136  K 4.4 4.6 5.4* 5.6* 4.7 4.3  CL 104 103 98* 100* 101 98*  CO2 27 24 24 23 26 28   GLUCOSE 156* 140* 98 118* 91 129*  BUN 62* 73* 82* 90* 56* 57*  CREATININE 1.70* 1.71* SEE COMMENTS UNABLE TO PERFORM DUE TOO ICTERIC INTERFERENCE UNABLE TO PERFORM DUE TO ICTERUS UNABLE TO REPORT DUE TO ICTERUS  CALCIUM 9.8 10.1 9.6 9.8 9.7 9.6  PHOS UNABLE TO REPORT DUE TO ICTERUS  --   --   --   --   --     Liver Function Tests:  Recent Labs Lab 06/18/15 0442 06/19/15 0905 06/20/15 0539 06/21/15 0801 06/23/15 0513  AST 39   --  59* 49* 25  ALT 41  --  66* 70* 40  ALKPHOS 139*  --  122 94 80  BILITOT 12.9*  --  15.6* 33.0* 41.1*  PROT 6.4*  --  6.5 6.3* 5.8*  ALBUMIN 3.0* 3.0* 3.1* 2.9* 2.6*   No results for input(s): LIPASE, AMYLASE in the last 168 hours. No results for input(s): AMMONIA in the last 168 hours.  CBC:  Recent Labs Lab 06/18/15 0442 06/20/15 0539 06/21/15 0801  WBC 9.8 15.0* 22.3*  NEUTROABS  --  13.1*  --   HGB 7.6* 7.7* 7.2*  HCT 23.1* 23.8* 21.6*  MCV 96.9 97.0 94.2  PLT 160 118* 104*    Cardiac Enzymes: No results for input(s): CKTOTAL, CKMB, CKMBINDEX, TROPONINI in the last 168 hours.  BNP: Invalid input(s): POCBNP  CBG: No results for input(s): GLUCAP in the last 168 hours.  Microbiology: Results for orders placed or performed during the hospital encounter of 06/05/15  Body fluid culture     Status: None   Collection Time: 06/18/15  2:35 PM  Result Value Ref Range Status   Specimen Description ABDOMEN  Final   Special Requests NONE  Final   Gram Stain FEW WBC SEEN NO ORGANISMS SEEN   Final   Culture No growth aerobically or anaerobically.  Final   Report Status 06/22/2015 FINAL  Final  C difficile quick scan w PCR reflex     Status: None   Collection Time: 06/21/15  1:00 AM  Result Value Ref Range Status   C Diff antigen NEGATIVE NEGATIVE Final   C Diff toxin NEGATIVE NEGATIVE Final   C Diff interpretation Negative for C. difficile  Final  CULTURE, BLOOD (ROUTINE X 2) w Reflex to PCR ID Panel     Status: None (Preliminary result)   Collection Time: 06/22/15 12:10 PM  Result Value Ref Range Status   Specimen Description BLOOD LEFT ANTECUBITAL  Final   Special Requests BOTTLES DRAWN AEROBIC AND ANAEROBIC  3CC  Final   Culture  Setup Time   Final    GRAM POSITIVE COCCI IN BOTH AEROBIC AND ANAEROBIC BOTTLES CRITICAL RESULT CALLED TO, READ BACK BY AND VERIFIED WITH: NATE COOKSON AT [place] 06/23/15.PMH CONFIRMED BY RWW    Culture   Final    GRAM POSITIVE  COCCI IN BOTH AEROBIC AND ANAEROBIC BOTTLES    Report Status PENDING  Incomplete  CULTURE, BLOOD (ROUTINE X 2) w Reflex to PCR ID Panel     Status: None (Preliminary result)   Collection Time: 06/22/15 12:21 PM  Result Value Ref Range Status   Specimen Description BLOOD RIGHT ARM  Final   Special Requests BOTTLES DRAWN AEROBIC AND ANAEROBIC  Sever[place]  Final   Culture  Setup Time   Final    GRAM POSITIVE COCCI IN BOTH AEROBIC AND ANAEROBIC BOTTLES CRITICAL RESULT CALLED TO, READ BACK BY AND VERIFIED WITH: NATE COOKSON AT [place] 06/23/15.PMH CONFIRMED BY RWW    Culture   Final    ENTEROCOCCUS SPECIES IN BOTH AEROBIC AND ANAEROBIC BOTTLES IDENTIFICATION AND SUSCEPTIBILITIES TO FOLLOW    Report Status PENDING  Incomplete  Blood Culture ID Panel (Reflexed)     Status: Abnormal   Collection Time: 06/22/15 12:21 PM  Result Value Ref Range Status   Enterococcus species DETECTED (A) NOT DETECTED Final    Comment: CRITICAL RESULT CALLED TO, READ BACK BY AND VERIFIED WITH: NATE COOKSON AT 0330 06/23/15.PMH    Listeria monocytogenes NOT DETECTED NOT DETECTED Final   Staphylococcus species NOT DETECTED NOT DETECTED Final   Staphylococcus aureus NOT DETECTED NOT DETECTED Final   Streptococcus species NOT DETECTED NOT DETECTED Final   Streptococcus agalactiae NOT DETECTED NOT DETECTED Final   Streptococcus pneumoniae NOT DETECTED NOT DETECTED Final   Streptococcus pyogenes NOT DETECTED NOT DETECTED Final   Acinetobacter baumannii NOT DETECTED NOT DETECTED Final   Enterobacteriaceae species NOT DETECTED NOT DETECTED Final   Enterobacter cloacae complex NOT DETECTED NOT DETECTED Final   Escherichia coli NOT DETECTED NOT DETECTED Final   Klebsiella oxytoca NOT DETECTED NOT DETECTED Final   Klebsiella pneumoniae NOT DETECTED NOT DETECTED Final   Proteus species NOT DETECTED NOT DETECTED Final   Serratia marcescens NOT DETECTED NOT DETECTED Final   Haemophilus influenzae NOT DETECTED NOT DETECTED Final    Neisseria meningitidis NOT DETECTED NOT DETECTED Final   Pseudomonas aeruginosa NOT DETECTED NOT DETECTED Final   Candida albicans NOT DETECTED NOT DETECTED Final   Candida glabrata NOT DETECTED NOT DETECTED Final   Candida krusei NOT DETECTED NOT DETECTED Final   Candida parapsilosis NOT DETECTED NOT DETECTED Final   Candida tropicalis NOT DETECTED NOT DETECTED Final   Carbapenem resistance NOT DETECTED NOT DETECTED Final   Methicillin resistance NOT DETECTED NOT DETECTED Final   Vancomycin resistance NOT DETECTED NOT DETECTED Final    Coagulation Studies:  Recent Labs  06/21/15 1838 06/22/15 0535 06/23/15 0513  LABPROT 19.6* 17.1* 15.9*  INR 1.66 1.38 1.26    Urinalysis: No results for input(s): COLORURINE, LABSPEC, PHURINE, GLUCOSEU, HGBUR, BILIRUBINUR, KETONESUR, PROTEINUR, UROBILINOGEN, NITRITE, LEUKOCYTESUR in the last 72 hours.  Invalid input(s): APPERANCEUR    Imaging: Dg Chest 1 View  06/22/2015  CLINICAL DATA:  New onset of fever, the patient had dialysis yesterday EXAM: CHEST 1 VIEW COMPARISON:  06/09/2015 FINDINGS: Cardiomegaly is noted. Central mild vascular congestion. Stable right IJ catheter with tip in upper SVC. There is a left IJ dialysis catheter with tip in SVC right atrium junction. No pneumothorax. No pulmonary edema. No segmental infiltrate. Patchy mild basilar atelectasis or infiltrate with improvement in aeration from prior exam. Stable calcification in left atrial appendage. IMPRESSION: Stable right IJ catheter with tip in upper SVC. There is a left IJ dialysis catheter with tip in SVC right atrium junction. No pneumothorax. No pulmonary edema. No segmental infiltrate. Patchy mild basilar atelectasis or infiltrate with improvement in aeration from prior exam. Electronically Signed   By: Lahoma Crocker M.D.   On: 06/22/2015 17:31     Medications:     . allopurinol  100 mg Oral Daily  . calcitRIOL  0.25 mcg Oral Daily  . collagenase  1 application  Topical Daily  . folic acid  1 mg Oral Daily  . heparin lock flush  500 Units Intravenous Once  . ipratropium-albuterol  3 mL Nebulization TID  . levofloxacin (LEVAQUIN) IV  500 mg Intravenous Q24H  . metoprolol tartrate  50 mg Oral BID  . mometasone-formoterol  2 puff Inhalation BID  . predniSONE  20 mg Oral Q breakfast  . ursodiol  300 mg Oral BID  . vancomycin  1,000 mg Intravenous Once     Assessment/ Plan:  [name] is a 65 y.o. 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 acute renal failure due to severe anemia, hypoxia and prerenal azotemia from poor PO intake and vomiting.  Chronic Kidney Disease is secondary to NSAID use and sickle cell anemia. Left IJ PC placed 06/13/14. -   BUN remained stable over the past 2 days in the 50s. Creatinine unable to be Related due to icterus. Bilirubin continues to rise.  Dr. Earleen Newport has discussed case with staff at [facility].  Maybe transferred there.  We'll keep PermCath in place for now.  2. Hyperkalemia - Potassium down to 4.3. Continue to monitor.  3. Hypertension: Blood pressure currently 94/56. She is on metoprolol.   4. Secondary Hyperparathyroidism: PTH 250. Calcium at goal during admission.  - Continue calcitriol 0.25 g by mouth daily  5.  ascites and anasarca -  low-dose Lasix infusion was ineffective and low-dose spironolactone was discontinued due to hyperkalemia - 2-D echo shows EF 50-55%, moderate mitral regurgitation, severe tricuspid regurgitation  - Valvular abnormalities make fluid management extremely challenging - Overall edema has improved a bit. Continue ultrafiltration  with dialysis.  6. 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 Hemoglobin back to her baseline at 7. - Most recent hemoglobin was found to be 7.2 which is close to her baseline as before.  LOS: 18 Jane Short 1/22/20172:22 PM

## 2015-06-24 ENCOUNTER — Other Ambulatory Visit: Payer: Self-pay | Admitting: *Deleted

## 2015-06-24 DIAGNOSIS — N183 Chronic kidney disease, stage 3 unspecified: Secondary | ICD-10-CM

## 2015-06-24 DIAGNOSIS — D571 Sickle-cell disease without crisis: Secondary | ICD-10-CM

## 2015-06-24 LAB — COMPREHENSIVE METABOLIC PANEL
ALK PHOS: 82 U/L (ref 38–126)
ALT: 40 U/L (ref 14–54)
AST: 27 U/L (ref 15–41)
Albumin: 2.6 g/dL — ABNORMAL LOW (ref 3.5–5.0)
Anion gap: 8 (ref 5–15)
BUN: 61 mg/dL — ABNORMAL HIGH (ref 6–20)
CALCIUM: 9.9 mg/dL (ref 8.9–10.3)
CO2: 28 mmol/L (ref 22–32)
CREATININE: UNDETERMINED mg/dL (ref 0.44–1.00)
Chloride: 97 mmol/L — ABNORMAL LOW (ref 101–111)
GLUCOSE: 106 mg/dL — AB (ref 65–99)
Potassium: 4.9 mmol/L (ref 3.5–5.1)
SODIUM: 133 mmol/L — AB (ref 135–145)
Total Bilirubin: 42.5 mg/dL (ref 0.3–1.2)
Total Protein: 6 g/dL — ABNORMAL LOW (ref 6.5–8.1)

## 2015-06-24 LAB — RENAL FUNCTION PANEL
Albumin: 2.6 g/dL — ABNORMAL LOW (ref 3.5–5.0)
Anion gap: 10 (ref 5–15)
BUN: 62 mg/dL — AB (ref 6–20)
CHLORIDE: 95 mmol/L — AB (ref 101–111)
CO2: 26 mmol/L (ref 22–32)
CREATININE: UNDETERMINED mg/dL (ref 0.44–1.00)
Calcium: 9.8 mg/dL (ref 8.9–10.3)
Glucose, Bld: 94 mg/dL (ref 65–99)
Potassium: 4.8 mmol/L (ref 3.5–5.1)
Sodium: 131 mmol/L — ABNORMAL LOW (ref 135–145)

## 2015-06-24 LAB — CBC
HCT: 18.7 % — ABNORMAL LOW (ref 35.0–47.0)
Hemoglobin: 6.4 g/dL — ABNORMAL LOW (ref 12.0–16.0)
MCH: 32.6 pg (ref 26.0–34.0)
MCHC: 34 g/dL (ref 32.0–36.0)
MCV: 95.8 fL (ref 80.0–100.0)
PLATELETS: 105 10*3/uL — AB (ref 150–440)
RBC: 1.96 MIL/uL — ABNORMAL LOW (ref 3.80–5.20)
RDW: 22.7 % — AB (ref 11.5–14.5)
WBC: 14.8 10*3/uL — AB (ref 3.6–11.0)

## 2015-06-24 LAB — SURGICAL PATHOLOGY

## 2015-06-24 LAB — PROTIME-INR
INR: 1.29
Prothrombin Time: 16.2 seconds — ABNORMAL HIGH (ref 11.4–15.0)

## 2015-06-24 LAB — VANCOMYCIN, TROUGH: Vancomycin Tr: 19 ug/mL (ref 10–20)

## 2015-06-24 MED ORDER — HYDROMORPHONE HCL 1 MG/ML IJ SOLN
0.5000 mg | Freq: Once | INTRAMUSCULAR | Status: AC
  Administered 2015-06-24: 0.5 mg via INTRAVENOUS
  Filled 2015-06-24: qty 1

## 2015-06-24 MED ORDER — MORPHINE SULFATE (PF) 4 MG/ML IV SOLN
4.0000 mg | INTRAVENOUS | Status: DC | PRN
Start: 2015-06-24 — End: 2015-06-25

## 2015-06-24 NOTE — Progress Notes (Signed)
Hemodialysis start 

## 2015-06-24 NOTE — Progress Notes (Signed)
Patient ID: Jane Short, female   DOB: Mar 01, 1951, 65 y.o.   MRN: [id number] [facility] PROGRESS NOTE  Jane Short [id number] DOB: 06/08/1950 DOA: 06/05/2015 PCP: [name], MD  HPI/Subjective: Patient feeling okay this am, still with some shortness of breath and cough.  Objective: Filed Vitals:   06/24/15 0522 06/24/15 0912  BP: 97/57 107/60  Pulse: 96 93  Temp: 98 F (36.7 C) 98.2 F (36.8 C)  Resp: 18 26    Filed Weights   06/18/15 1508 06/21/15 1830 06/21/15 2139  Weight: 88.86 kg (195 lb 14.4 oz) 86.8 kg (191 lb 5.8 oz) 87.68 kg (193 lb 4.8 oz)    ROS: Review of Systems  Constitutional: Negative for fever and chills.  Eyes: Negative for blurred vision.  Respiratory: Positive for cough and shortness of breath.   Cardiovascular: Negative for chest pain.  Gastrointestinal: Positive for abdominal pain. Negative for nausea, vomiting, diarrhea and constipation.  Genitourinary: Negative for dysuria.  Musculoskeletal: Negative for joint pain.  Neurological: Negative for dizziness and headaches.   Exam: Physical Exam  Constitutional: She is oriented to person, place, and time.  HENT:  Nose: No mucosal edema.  Mouth/Throat: No oropharyngeal exudate or posterior oropharyngeal edema.  Eyes: Conjunctivae, EOM and lids are normal. Pupils are equal, round, and reactive to light.  Neck: No JVD present. Carotid bruit is not present. No edema present. No thyroid mass and no thyromegaly present.  Cardiovascular: S1 normal and S2 normal.  Exam reveals no gallop.   No murmur heard. Pulses:      Dorsalis pedis pulses are 2+ on the right side, and 2+ on the left side.  Respiratory: No respiratory distress. She has no wheezes. She has rhonchi in the right lower field and the left lower field. She has no rales.  GI: Soft. Bowel sounds are normal. She exhibits distension and fluid wave. There is tenderness in the epigastric area.   Musculoskeletal:       Right shoulder: She exhibits no swelling.       Right ankle: She exhibits swelling.       Left ankle: She exhibits swelling.  Lymphadenopathy:    She has no cervical adenopathy.  Neurological: She is alert and oriented to person, place, and time. No cranial nerve deficit.  Skin: Skin is warm. Nails show no clubbing.  Jaundiced  Psychiatric: She has a normal mood and affect.     Data Reviewed: Basic Metabolic Panel:  Recent Labs Lab 06/19/15 0905 06/20/15 0539 06/21/15 0801 06/21/15 1422 06/22/15 0535 06/23/15 0513  NA 137 136 130* 132* 136 136  K 4.4 4.6 5.4* 5.6* 4.7 4.3  CL 104 103 98* 100* 101 98*  CO2 27 24 24 23 26 28   GLUCOSE 156* 140* 98 118* 91 129*  BUN 62* 73* 82* 90* 56* 57*  CREATININE 1.70* 1.71* SEE COMMENTS UNABLE TO PERFORM DUE TOO ICTERIC INTERFERENCE UNABLE TO PERFORM DUE TO ICTERUS UNABLE TO REPORT DUE TO ICTERUS  CALCIUM 9.8 10.1 9.6 9.8 9.7 9.6  PHOS UNABLE TO REPORT DUE TO ICTERUS  --   --   --   --   --    Liver Function Tests:  Recent Labs Lab 06/18/15 0442 06/19/15 0905 06/20/15 0539 06/21/15 0801 06/23/15 0513  AST 39  --  59* 49* 25  ALT 41  --  66* 70* 40  ALKPHOS 139*  --  122 94 80  BILITOT 12.9*  --  15.6* 33.0* 41.1*  PROT 6.4*  --  6.5 6.3* 5.8*  ALBUMIN 3.0* 3.0* 3.1* 2.9* 2.6*   CBC:  Recent Labs Lab 06/18/15 0442 06/20/15 0539 06/21/15 0801  WBC 9.8 15.0* 22.3*  NEUTROABS  --  13.1*  --   HGB 7.6* 7.7* 7.2*  HCT 23.1* 23.8* 21.6*  MCV 96.9 97.0 94.2  PLT 160 118* 104*   BNP (last 3 results)  Recent Labs  06/05/15 0407  BNP 683.0*    Recent Results (from the past 240 hour(s))  Body fluid culture     Status: None   Collection Time: 06/18/15  2:35 PM  Result Value Ref Range Status   Specimen Description ABDOMEN  Final   Special Requests NONE  Final   Gram Stain FEW WBC SEEN NO ORGANISMS SEEN   Final   Culture No growth aerobically or anaerobically.  Final   Report Status  06/22/2015 FINAL  Final  C difficile quick scan w PCR reflex     Status: None   Collection Time: 06/21/15  1:00 AM  Result Value Ref Range Status   C Diff antigen NEGATIVE NEGATIVE Final   C Diff toxin NEGATIVE NEGATIVE Final   C Diff interpretation Negative for C. difficile  Final  CULTURE, BLOOD (ROUTINE X 2) w Reflex to PCR ID Panel     Status: None (Preliminary result)   Collection Time: 06/22/15 12:10 PM  Result Value Ref Range Status   Specimen Description BLOOD LEFT ANTECUBITAL  Final   Special Requests BOTTLES DRAWN AEROBIC AND ANAEROBIC  3CC  Final   Culture  Setup Time   Final    GRAM POSITIVE COCCI IN BOTH AEROBIC AND ANAEROBIC BOTTLES CRITICAL RESULT CALLED TO, READ BACK BY AND VERIFIED WITH: NATE COOKSON AT [place] 06/23/15.PMH CONFIRMED BY [facility]    Culture   Final    GRAM POSITIVE COCCI IN BOTH AEROBIC AND ANAEROBIC BOTTLES    Report Status PENDING  Incomplete  CULTURE, BLOOD (ROUTINE X 2) w Reflex to PCR ID Panel     Status: None (Preliminary result)   Collection Time: 06/22/15 12:21 PM  Result Value Ref Range Status   Specimen Description BLOOD RIGHT ARM  Final   Special Requests BOTTLES DRAWN AEROBIC AND ANAEROBIC  [place]  Final   Culture  Setup Time   Final    GRAM POSITIVE COCCI IN BOTH AEROBIC AND ANAEROBIC BOTTLES CRITICAL RESULT CALLED TO, READ BACK BY AND VERIFIED WITH: NATE COOKSON AT [place] 06/23/15.PMH CONFIRMED BY RWW    Culture   Final    ENTEROCOCCUS SPECIES IN BOTH AEROBIC AND ANAEROBIC BOTTLES IDENTIFICATION AND SUSCEPTIBILITIES TO FOLLOW    Report Status PENDING  Incomplete  Blood Culture ID Panel (Reflexed)     Status: Abnormal   Collection Time: 06/22/15 12:21 PM  Result Value Ref Range Status   Enterococcus species DETECTED (A) NOT DETECTED Final    Comment: CRITICAL RESULT CALLED TO, READ BACK BY AND VERIFIED WITH: NATE COOKSON AT 0330 06/23/15.PMH    Listeria monocytogenes NOT DETECTED NOT DETECTED Final   Staphylococcus species NOT DETECTED NOT  DETECTED Final   Staphylococcus aureus NOT DETECTED NOT DETECTED Final   Streptococcus species NOT DETECTED NOT DETECTED Final   Streptococcus agalactiae NOT DETECTED NOT DETECTED Final   Streptococcus pneumoniae NOT DETECTED NOT DETECTED Final   Streptococcus pyogenes NOT DETECTED NOT DETECTED Final   Acinetobacter baumannii NOT DETECTED NOT DETECTED Final   Enterobacteriaceae species NOT DETECTED NOT DETECTED Final   Enterobacter cloacae complex NOT  DETECTED NOT DETECTED Final   Escherichia coli NOT DETECTED NOT DETECTED Final   Klebsiella oxytoca NOT DETECTED NOT DETECTED Final   Klebsiella pneumoniae NOT DETECTED NOT DETECTED Final   Proteus species NOT DETECTED NOT DETECTED Final   Serratia marcescens NOT DETECTED NOT DETECTED Final   Haemophilus influenzae NOT DETECTED NOT DETECTED Final   Neisseria meningitidis NOT DETECTED NOT DETECTED Final   Pseudomonas aeruginosa NOT DETECTED NOT DETECTED Final   Candida albicans NOT DETECTED NOT DETECTED Final   Candida glabrata NOT DETECTED NOT DETECTED Final   Candida krusei NOT DETECTED NOT DETECTED Final   Candida parapsilosis NOT DETECTED NOT DETECTED Final   Candida tropicalis NOT DETECTED NOT DETECTED Final   Carbapenem resistance NOT DETECTED NOT DETECTED Final   Methicillin resistance NOT DETECTED NOT DETECTED Final   Vancomycin resistance NOT DETECTED NOT DETECTED Final     Scheduled Meds: . allopurinol  100 mg Oral Daily  . calcitRIOL  0.25 mcg Oral Daily  . collagenase  1 application Topical Daily  . folic acid  1 mg Oral Daily  . heparin lock flush  500 Units Intravenous Once  . ipratropium-albuterol  3 mL Nebulization TID  . metoprolol tartrate  25 mg Oral BID  . mometasone-formoterol  2 puff Inhalation BID  . predniSONE  20 mg Oral Q breakfast  . ursodiol  300 mg Oral BID   Assessment/Plan:  1. Fever, leukocytosis. Enterococcus species and blood cultures. Vancomycin.  2. Acute diastolic congestive heart failure  with moderate mitral regurgitation and severe tricuspid regurgitation. Hemodialysis to manage fluid. 3. Abdominal swelling and distention and shortness of breath likely with heart failure and cirrhosis. Dialysis to manage fluid. Liver biopsy still pending. 4. Acute renal failure on chronic kidney disease with hyperkalemia. 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 5. Sickle cell disease status post 2 units of transfusion. Hydroxyurea stopped by gastroenterology 6. Jaundice, ultrasound of the abdomen consistent with cirrhosis. Liver biopsy still pending. Dr. Candace Cruise stopped hydroxyurea and started Actigall. Case discussed with Dr. Jenetta Downer and recommended transfer to a tertiary care center since the bilirubin still continues to increase. He recommends a liver specialist to see the patient. 7. Hyperlipidemia hold Crestor 8. A wide complex tachycardia which spontaneously broke. Since blood pressure on the lower side decrease the metoprolol to 25 mg twice a day again 9. History of COPD.  10. Acute respiratory failure now on oxygen 11. Atrial fibrillation rapid ventricular response on admission now rate controlled. 12. Diarrhea- resolved after stopping MiraLAX.  Code Status:     Code Status Orders        Start     Ordered   06/05/15 1106  Full code   Continuous     06/05/15 1105    Code Status History    Date Active Date Inactive Code Status Order ID Comments User Context   This patient has a current code status but no historical code status.     Family Communication: Husband at the bedside Disposition Plan: Transfer to [facility] care center when bed available  Consultants:  Gastroenterology  Nephrology  Cardiology  Procedures:  Dialysis catheter placement  Time spent: 35 minutes  Loletha Grayer  [facility]

## 2015-06-24 NOTE — Progress Notes (Signed)
Patient's pain is not being managed by the PRN medications ordered. Dr. Jannifer Franklin notified with a new order for Hydromorphone 0.5 mg x 1 dose and the morphine is bumped up from 2 mg to 4 mg every 4 hours PRN. Will continue to monitor and report.

## 2015-06-24 NOTE — Progress Notes (Signed)
Hemodialysis completed. 

## 2015-06-24 NOTE — Progress Notes (Signed)
Dr. Estanislado Pandy call back but no new order received.

## 2015-06-24 NOTE — Progress Notes (Addendum)
Transportation, [place] has arrived for pt.  Pt and husband collected all belongings.  VSS.  Telemetry box removed. Paperwork provided for transporters.  No further questions or concerns.  Pt rolled off floor at 2300. Jes[place] Avers

## 2015-06-24 NOTE — Progress Notes (Signed)
Pt's transportation to [facility] called- running behind but will be here to pickup around 10:30-10:45. Pt and husband updated- nighttime medications administered.  No needs at this time.  Will continue to monitor Jane Short

## 2015-06-24 NOTE — Progress Notes (Signed)
Physical Therapy Treatment Patient Details Name: Jane Short MRN: [id number] DOB: 1951-03-29 Today's Date: 06/24/2015    History of Present Illness Pt is here with CHF, she had blood transfusions and overall she is feeling better at time of eval.    PT Comments    Pt reporting feeling that her R hip "locked up" on her yesterday and has history of doing this.  Pt noted to have antalgic gait compared to last session d/t this and now requiring use of RW.  Distance limited compared to last session d/t O2 desaturation to 85% on room air with activity (compared to 93-95% at rest) but with vc's for purse lip breathing improved to 90% on room air with activity.  Will continue to work on breathing techniques while monitoring O2, ambulation with least restrictive AD, and LE ex's.  Pt reports plan to discharge to [facility] when bed is available.   Follow Up Recommendations  No PT follow up     Equipment Recommendations   (may need RW pending progress)    Recommendations for Other Services       Precautions / Restrictions Precautions Precautions: Fall Restrictions Weight Bearing Restrictions: No    Mobility  Bed Mobility               General bed mobility comments: Not assessed; pt sitting on edge of bed upon arrival  Transfers Overall transfer level: Needs assistance Equipment used: Rolling walker (2 wheeled) Transfers: Sit to/from Stand Sit to Stand: Supervision;Min guard         General transfer comment: steady without loss of balance; 4 trials; pt required initial vc's for hand placement for walker use  Ambulation/Gait Ambulation/Gait assistance: Supervision;Min guard Ambulation Distance (Feet):  (30 feet x3; x12 feet) Assistive device: Rolling walker (2 wheeled)   Gait velocity: decreased   General Gait Details: antalgic; decreased stance time R LE with increased UE WB'ing through RW to advance L LE during R stance phase; decreased step length R compared to L;  limited distance d/t O2 desaturation with distance (to 85% RA) but with vc's for purse lip breathing improved to 90% with activity   Stairs            Wheelchair Mobility    Modified Rankin (Stroke Patients Only)       Balance Overall balance assessment: Needs assistance Sitting-balance support: No upper extremity supported;Feet supported Sitting balance-Leahy Scale: Normal     Standing balance support: Bilateral upper extremity supported (on RW) Standing balance-Leahy Scale: Good                      Cognition Arousal/Alertness: Awake/alert Behavior During Therapy: WFL for tasks assessed/performed Overall Cognitive Status: Within Functional Limits for tasks assessed                      Exercises      General Comments   Nursing cleared pt for participation in physical therapy.  Pt agreeable to PT session.  Pt's husband present during session.       Pertinent Vitals/Pain Pain Assessment: 0-10 Pain Score: 4  Pain Location: R hip anterior Pain Descriptors / Indicators: Tightness ("tension") Pain Intervention(s): Limited activity within patient's tolerance;Monitored during session  HR 91-110 bpm during session.  See flow sheet for details.    Home Living                      Prior Function  PT Goals (current goals can now be found in the care plan section) Acute Rehab PT Goals Patient Stated Goal: Go home PT Goal Formulation: With patient Time For Goal Achievement: 07/03/15 Potential to Achieve Goals: Good Progress towards PT goals:  (Limited ambulation d/t O2 desaturation)    Frequency  Min 2X/week    PT Plan Current plan remains appropriate    Co-evaluation             End of Session Equipment Utilized During Treatment: Gait belt Activity Tolerance: Other (comment) (Limited d/t O2 desaturation with activity) Patient left: in chair;with call bell/phone within reach;with chair alarm set;with family/visitor  present     Time: 0925-1005 PT Time Calculation (min) (ACUTE ONLY): 40 min  Charges:  $Therapeutic Exercise: 23-37 mins $Therapeutic Activity: 8-22 mins                    G CodesLeitha Bleak 07/23/2015, 10:38 AM Leitha Bleak, [place]

## 2015-06-24 NOTE — Care Management Note (Signed)
Case Management Note  Patient Details  Name: Jane Short MRN: [id number] Date of Birth: [birth year]  Subjective/Objective:       Called the [facility] and spoke with Claiborne Billings to obtain an update on the transfer to [place] for [name]. [facility] is on diversion and do not anticipate any transfers for the remainder of this week.              Action/Plan:   Expected Discharge Date:                  Expected Discharge Plan:  Home/Self Care  In-House Referral:     Discharge planning Services  CM Consult  Post Acute Care Choice:    Choice offered to:     DME Arranged:    DME Agency:     HH Arranged:  Patient Refused HH Agency:     Status of Service:  Completed, signed off  Medicare Important Message Given:    Date Medicare IM Given:    Medicare IM give by:    Date Additional Medicare IM Given:    Additional Medicare Important Message give by:     If discussed at [place] of Stay Meetings, dates discussed:    Additional Comments:  Aaren Krog A, RN 06/24/2015, 9:45 AM

## 2015-06-24 NOTE — Care Management Note (Addendum)
Case Management Note  Patient Details  Name: Jane Short MRN: [id number] Date of Birth: 11/27/1950  Subjective/Objective:    Per Dr Rudean Hitt note today, [facility] has contacted him and [facility] expects to open up a bed later today. Chart is copied and in a [place] folder beside Jane Short's chart at the nurses station. When [facility] calls with a bed, [facility] will need to be notified. Reviewed with [place], RN that the EMTALA would need to be completed by both her and Dr Leslye Peer. Also [place] is aware that a Medical Necessity form must be completed.  Both the completed EMTALA and the Medical Necessity forms are to go with Jane Short to [facility]. A CD of Jane Short radiology results can be obtained from the [facility] Radiology Dept and will also go with Jane Short to [facility]. Jane Short was transported to [facility] dialysis today at 2:45pm.                 Action/Plan:   Expected Discharge Date:                  Expected Discharge Plan:  Home/Self Care  In-House Referral:     Discharge planning Services  CM Consult  Post Acute Care Choice:    Choice offered to:     DME Arranged:    DME Agency:     HH Arranged:  Patient Refused [place] Agency:     Status of Service:  Completed, signed off  Medicare Important Message Given:    Date Medicare IM Given:    Medicare IM give by:    Date Additional Medicare IM Given:    Additional Medicare Important Message give by:     If discussed at [place] of Stay Meetings, dates discussed:    Additional Comments:  Hussam Muniz A, RN 06/24/2015, 2:49 PM

## 2015-06-24 NOTE — Progress Notes (Signed)
[facility]  ROUNDING NOTE   Subjective:  Bilirubin continues to rise. Patient sitting up in bed. Husband at bedside.  Awaiting bed at [facility]   Objective:  Vital signs in last 24 hours:  Temp:  [97.6 F (36.4 C)-98.7 F (37.1 C)] 98.2 F (36.8 C) (01/23 0912) Pulse Rate:  [86-105] 93 (01/23 0912) Resp:  [18-26] 26 (01/23 0912) BP: (94-107)/(49-60) 107/60 mmHg (01/23 0912) SpO2:  [88 %-98 %] 94 % (01/23 0912)  Weight change:  Filed Weights   06/18/15 1508 06/21/15 1830 06/21/15 2139  Weight: 88.86 kg (195 lb 14.4 oz) 86.8 kg (191 lb 5.8 oz) 87.68 kg (193 lb 4.8 oz)    Intake/Output: I/O last 3 completed shifts: In: 200 [IV Piggyback:200] Out: 0    Intake/Output this shift:     Physical Exam: General: NAD  Head: Normocephalic, atraumatic. Moist oral mucosal membranes  Eyes: scleral icterus  Neck: Supple, trachea midline  Lungs:  CTAB, normal respiratory effort   Heart: Irregular, 3/6 murmur  Abdomen:  Soft, nontender, +bowel sounds, distended  Extremities:  anasarca   Neurologic:  alert, oriented, follows commands  Skin: Ulcerated lesions bilateral lower extremities, 1+ b/l LE edema  Access:  left IJ PermCath 06/14/15 Dr. Delana Meyer    Basic Metabolic Panel:  Recent Labs Lab 06/19/15 0370 06/20/15 0539 06/21/15 0801 06/21/15 1422 06/22/15 0535 06/23/15 0513  NA 137 136 130* 132* 136 136  K 4.4 4.6 5.4* 5.6* 4.7 4.3  CL 104 103 98* 100* 101 98*  CO2 27 24 24 23 26 28   GLUCOSE 156* 140* 98 118* 91 129*  BUN 62* 73* 82* 90* 56* 57*  CREATININE 1.70* 1.71* SEE COMMENTS UNABLE TO PERFORM DUE TOO ICTERIC INTERFERENCE UNABLE TO PERFORM DUE TO ICTERUS UNABLE TO REPORT DUE TO ICTERUS  CALCIUM 9.8 10.1 9.6 9.8 9.7 9.6  PHOS UNABLE TO REPORT DUE TO ICTERUS  --   --   --   --   --     Liver Function Tests:  Recent Labs Lab 06/18/15 0442 06/19/15 0905 06/20/15 0539 06/21/15 0801 06/23/15 0513  AST 39  --  59* 49* 25  ALT 41  --  66* 70* 40   ALKPHOS 139*  --  122 94 80  BILITOT 12.9*  --  15.6* 33.0* 41.1*  PROT 6.4*  --  6.5 6.3* 5.8*  ALBUMIN 3.0* 3.0* 3.1* 2.9* 2.6*   No results for input(s): LIPASE, AMYLASE in the last 168 hours. No results for input(s): AMMONIA in the last 168 hours.  CBC:  Recent Labs Lab 06/18/15 0442 06/20/15 0539 06/21/15 0801  WBC 9.8 15.0* 22.3*  NEUTROABS  --  13.1*  --   HGB 7.6* 7.7* 7.2*  HCT 23.1* 23.8* 21.6*  MCV 96.9 97.0 94.2  PLT 160 118* 104*    Cardiac Enzymes: No results for input(s): CKTOTAL, CKMB, CKMBINDEX, TROPONINI in the last 168 hours.  BNP: Invalid input(s): POCBNP  CBG: No results for input(s): GLUCAP in the last 168 hours.  Microbiology: Results for orders placed or performed during the hospital encounter of 06/05/15  Body fluid culture     Status: None   Collection Time: 06/18/15  2:35 PM  Result Value Ref Range Status   Specimen Description ABDOMEN  Final   Special Requests NONE  Final   Gram Stain FEW WBC SEEN NO ORGANISMS SEEN   Final   Culture No growth aerobically or anaerobically.  Final   Report Status 06/22/2015 FINAL  Final  C difficile quick scan w PCR reflex     Status: None   Collection Time: 06/21/15  1:00 AM  Result Value Ref Range Status   C Diff antigen NEGATIVE NEGATIVE Final   C Diff toxin NEGATIVE NEGATIVE Final   C Diff interpretation Negative for C. difficile  Final  CULTURE, BLOOD (ROUTINE X 2) w Reflex to PCR ID Panel     Status: None (Preliminary result)   Collection Time: 06/22/15 12:10 PM  Result Value Ref Range Status   Specimen Description BLOOD LEFT ANTECUBITAL  Final   Special Requests BOTTLES DRAWN AEROBIC AND ANAEROBIC  3CC  Final   Culture  Setup Time   Final    GRAM POSITIVE COCCI IN BOTH AEROBIC AND ANAEROBIC BOTTLES CRITICAL RESULT CALLED TO, READ BACK BY AND VERIFIED WITH: NATE COOKSON AT [place] 06/23/15.PMH CONFIRMED BY RWW    Culture   Final    GRAM POSITIVE COCCI IN BOTH AEROBIC AND ANAEROBIC BOTTLES     Report Status PENDING  Incomplete  CULTURE, BLOOD (ROUTINE X 2) w Reflex to PCR ID Panel     Status: None (Preliminary result)   Collection Time: 06/22/15 12:21 PM  Result Value Ref Range Status   Specimen Description BLOOD RIGHT ARM  Final   Special Requests BOTTLES DRAWN AEROBIC AND ANAEROBIC  [place]  Final   Culture  Setup Time   Final    GRAM POSITIVE COCCI IN BOTH AEROBIC AND ANAEROBIC BOTTLES CRITICAL RESULT CALLED TO, READ BACK BY AND VERIFIED WITH: NATE COOKSON AT [place] 06/23/15.PMH CONFIRMED BY RWW    Culture   Final    ENTEROCOCCUS SPECIES IN BOTH AEROBIC AND ANAEROBIC BOTTLES IDENTIFICATION AND SUSCEPTIBILITIES TO FOLLOW    Report Status PENDING  Incomplete  Blood Culture ID Panel (Reflexed)     Status: Abnormal   Collection Time: 06/22/15 12:21 PM  Result Value Ref Range Status   Enterococcus species DETECTED (A) NOT DETECTED Final    Comment: CRITICAL RESULT CALLED TO, READ BACK BY AND VERIFIED WITH: NATE COOKSON AT 0330 06/23/15.PMH    Listeria monocytogenes NOT DETECTED NOT DETECTED Final   Staphylococcus species NOT DETECTED NOT DETECTED Final   Staphylococcus aureus NOT DETECTED NOT DETECTED Final   Streptococcus species NOT DETECTED NOT DETECTED Final   Streptococcus agalactiae NOT DETECTED NOT DETECTED Final   Streptococcus pneumoniae NOT DETECTED NOT DETECTED Final   Streptococcus pyogenes NOT DETECTED NOT DETECTED Final   Acinetobacter baumannii NOT DETECTED NOT DETECTED Final   Enterobacteriaceae species NOT DETECTED NOT DETECTED Final   Enterobacter cloacae complex NOT DETECTED NOT DETECTED Final   Escherichia coli NOT DETECTED NOT DETECTED Final   Klebsiella oxytoca NOT DETECTED NOT DETECTED Final   Klebsiella pneumoniae NOT DETECTED NOT DETECTED Final   Proteus species NOT DETECTED NOT DETECTED Final   Serratia marcescens NOT DETECTED NOT DETECTED Final   Haemophilus influenzae NOT DETECTED NOT DETECTED Final   Neisseria meningitidis NOT DETECTED NOT  DETECTED Final   Pseudomonas aeruginosa NOT DETECTED NOT DETECTED Final   Candida albicans NOT DETECTED NOT DETECTED Final   Candida glabrata NOT DETECTED NOT DETECTED Final   Candida krusei NOT DETECTED NOT DETECTED Final   Candida parapsilosis NOT DETECTED NOT DETECTED Final   Candida tropicalis NOT DETECTED NOT DETECTED Final   Carbapenem resistance NOT DETECTED NOT DETECTED Final   Methicillin resistance NOT DETECTED NOT DETECTED Final   Vancomycin resistance NOT DETECTED NOT DETECTED Final    Coagulation Studies:  Recent Labs  06/21/15 1838 06/22/15 0535 06/23/15 0513 06/24/15 0453  LABPROT 19.6* 17.1* 15.9* 16.2*  INR 1.66 1.38 1.26 1.29    Urinalysis: No results for input(s): COLORURINE, LABSPEC, PHURINE, GLUCOSEU, HGBUR, BILIRUBINUR, KETONESUR, PROTEINUR, UROBILINOGEN, NITRITE, LEUKOCYTESUR in the last 72 hours.  Invalid input(s): APPERANCEUR    Imaging: Dg Chest 1 View  06/22/2015  CLINICAL DATA:  New onset of fever, the patient had dialysis yesterday EXAM: CHEST 1 VIEW COMPARISON:  06/09/2015 FINDINGS: Cardiomegaly is noted. Central mild vascular congestion. Stable right IJ catheter with tip in upper SVC. There is a left IJ dialysis catheter with tip in SVC right atrium junction. No pneumothorax. No pulmonary edema. No segmental infiltrate. Patchy mild basilar atelectasis or infiltrate with improvement in aeration from prior exam. Stable calcification in left atrial appendage. IMPRESSION: Stable right IJ catheter with tip in upper SVC. There is a left IJ dialysis catheter with tip in SVC right atrium junction. No pneumothorax. No pulmonary edema. No segmental infiltrate. Patchy mild basilar atelectasis or infiltrate with improvement in aeration from prior exam. Electronically Signed   By: Lahoma Crocker M.D.   On: 06/22/2015 17:31     Medications:     . allopurinol  100 mg Oral Daily  . calcitRIOL  0.25 mcg Oral Daily  . collagenase  1 application Topical Daily  .  folic acid  1 mg Oral Daily  . heparin lock flush  500 Units Intravenous Once  . ipratropium-albuterol  3 mL Nebulization TID  . metoprolol tartrate  25 mg Oral BID  . mometasone-formoterol  2 puff Inhalation BID  . predniSONE  20 mg Oral Q breakfast  . ursodiol  300 mg Oral BID     Assessment/ Plan:  Jane Short is a 65 y.o. black female  with sickle cell disease, atrial myxoma who was admitted on 06/05/2015 for Acute pulmonary edema ([place]) [J81.0] Hypoxia [R09.02] Atrial fibrillation with rapid ventricular response ([place]) [I48.91] Elevated bilirubin [R17] Abdominal pain [R10.9]  1. 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 acute renal failure thought to be due to severe anemia, hypoxia and prerenal azotemia from poor PO intake and vomiting. 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   Chronic Kidney Disease is secondary to NSAID use and sickle cell anemia. Left IJ PC placed 06/13/14.  Last hemodialysis 1/22. No acute indication for dialysis at this time. No acute indication for dialysis today.  Potassium currently at goal.  BUN will rise with hyperbilirubinemia.  - Continue to monitor volume status, serum electrolytes, urine output and renal function With bile cast nephropathy: literature suggests that hemodialysis and plasmapheresis are beneficial. We cannot provide plasmapheresis at this medical center. Will discuss this with Hospitalist  2. Hypertension: hypotensive at times.  - currently on metoprolol.    4. Secondary Hyperparathyroidism: PTH 250 on 06/07/15. No new phosphorus. Calcium at goal - Continue calcitriol 0.25 g by mouth daily  5.  ascites and anasarca -  low-dose Lasix infusion was ineffective and low-dose spironolactone was discontinued due to hyperkalemia - 2-D echo shows EF 50-55%, moderate mitral regurgitation, severe tricuspid  regurgitation  - Valvular abnormalities make fluid management extremely challenging - Overall edema has improved a bit. Continue ultrafiltration with dialysis.  6. 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 Hemoglobin back to her baseline at 7.2    LOS:  [place], [place] 1/23/20179:43 AM

## 2015-06-24 NOTE — Progress Notes (Signed)
ANTIBIOTIC CONSULT NOTE - FOLLOW UP   Pharmacy Consult for Vancomycin  Indication: rule out sepsis  Allergies  Allergen Reactions  . Ramipril     cough    Patient Measurements: Height: 5\' 7"  (170.2 cm) Weight: 193 lb 4.8 oz (87.68 kg) IBW/kg (Calculated) : 61.6 Adjusted Body Weight: 72.04 kg   Vital Signs: Temp: 98.5 F (36.9 C) (01/23 1122) Temp Source: Oral (01/23 1122) BP: 98/54 mmHg (01/23 1122) Pulse Rate: 90 (01/23 1122) Intake/Output from previous day:   Intake/Output from this shift:    Labs:  Recent Labs  06/23/15 0513 06/24/15 0453 06/24/15 0729  WBC  --  14.8*  --   HGB  --  6.4*  --   PLT  --  105*  --   CREATININE UNABLE TO REPORT DUE TO ICTERUS UNABLE TO REPORT DUE TO ICTERUS UNABLE TO REPORT DUE TO ICTERUS   CrCl cannot be calculated (Patient has no serum creatinine result on file.).  Recent Labs  06/24/15 0729  [facility] 19     Microbiology: Recent Results (from the past 720 hour(s))  Body fluid culture     Status: None   Collection Time: 06/18/15  2:35 PM  Result Value Ref Range Status   Specimen Description ABDOMEN  Final   Special Requests NONE  Final   Gram Stain FEW WBC SEEN NO ORGANISMS SEEN   Final   Culture No growth aerobically or anaerobically.  Final   Report Status 06/22/2015 FINAL  Final  C difficile quick scan w PCR reflex     Status: None   Collection Time: 06/21/15  1:00 AM  Result Value Ref Range Status   C Diff antigen NEGATIVE NEGATIVE Final   C Diff toxin NEGATIVE NEGATIVE Final   C Diff interpretation Negative for C. difficile  Final  CULTURE, BLOOD (ROUTINE X 2) w Reflex to PCR ID Panel     Status: None (Preliminary result)   Collection Time: 06/22/15 12:10 PM  Result Value Ref Range Status   Specimen Description BLOOD LEFT ANTECUBITAL  Final   Special Requests BOTTLES DRAWN AEROBIC AND ANAEROBIC  3CC  Final   Culture  Setup Time   Final    GRAM POSITIVE COCCI IN BOTH AEROBIC AND ANAEROBIC  BOTTLES CRITICAL RESULT CALLED TO, READ BACK BY AND VERIFIED WITH: NATE COOKSON AT [place] 06/23/15.PMH CONFIRMED BY RWW    Culture   Final    GRAM POSITIVE COCCI IN BOTH AEROBIC AND ANAEROBIC BOTTLES    Report Status PENDING  Incomplete  CULTURE, BLOOD (ROUTINE X 2) w Reflex to PCR ID Panel     Status: None (Preliminary result)   Collection Time: 06/22/15 12:21 PM  Result Value Ref Range Status   Specimen Description BLOOD RIGHT ARM  Final   Special Requests BOTTLES DRAWN AEROBIC AND ANAEROBIC  [place]  Final   Culture  Setup Time   Final    GRAM POSITIVE COCCI IN BOTH AEROBIC AND ANAEROBIC BOTTLES CRITICAL RESULT CALLED TO, READ BACK BY AND VERIFIED WITH: NATE COOKSON AT [place] 06/23/15.PMH CONFIRMED BY RWW    Culture   Final    ENTEROCOCCUS SPECIES IN BOTH AEROBIC AND ANAEROBIC BOTTLES IDENTIFICATION AND SUSCEPTIBILITIES TO FOLLOW    Report Status PENDING  Incomplete  Blood Culture ID Panel (Reflexed)     Status: Abnormal   Collection Time: 06/22/15 12:21 PM  Result Value Ref Range Status   Enterococcus species DETECTED (A) NOT DETECTED Final    Comment: CRITICAL RESULT CALLED  TO, READ BACK BY AND VERIFIED WITH: NATE COOKSON AT [place] 06/23/15.PMH    Listeria monocytogenes NOT DETECTED NOT DETECTED Final   Staphylococcus species NOT DETECTED NOT DETECTED Final   Staphylococcus aureus NOT DETECTED NOT DETECTED Final   Streptococcus species NOT DETECTED NOT DETECTED Final   Streptococcus agalactiae NOT DETECTED NOT DETECTED Final   Streptococcus pneumoniae NOT DETECTED NOT DETECTED Final   Streptococcus pyogenes NOT DETECTED NOT DETECTED Final   Acinetobacter baumannii NOT DETECTED NOT DETECTED Final   Enterobacteriaceae species NOT DETECTED NOT DETECTED Final   Enterobacter cloacae complex NOT DETECTED NOT DETECTED Final   Escherichia coli NOT DETECTED NOT DETECTED Final   Klebsiella oxytoca NOT DETECTED NOT DETECTED Final   Klebsiella pneumoniae NOT DETECTED NOT DETECTED Final    Proteus species NOT DETECTED NOT DETECTED Final   Serratia marcescens NOT DETECTED NOT DETECTED Final   Haemophilus influenzae NOT DETECTED NOT DETECTED Final   Neisseria meningitidis NOT DETECTED NOT DETECTED Final   Pseudomonas aeruginosa NOT DETECTED NOT DETECTED Final   Candida albicans NOT DETECTED NOT DETECTED Final   Candida glabrata NOT DETECTED NOT DETECTED Final   Candida krusei NOT DETECTED NOT DETECTED Final   Candida parapsilosis NOT DETECTED NOT DETECTED Final   Candida tropicalis NOT DETECTED NOT DETECTED Final   Carbapenem resistance NOT DETECTED NOT DETECTED Final   Methicillin resistance NOT DETECTED NOT DETECTED Final   Vancomycin resistance NOT DETECTED NOT DETECTED Final    Medical History: Past Medical History  Diagnosis Date  . Sickle cell anemia (HCC)     sickle cell disease  . Vitamin D deficiency   . Hypertension   . Osteoporosis 2011    osteopenia  . 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 Cheree Ditto at [place]  . Gout   . Sickle cell anemia (HCC)   . Lichen planus   . SCA-1 (spinocerebellar ataxia type 1) ([place])   . COPD (chronic obstructive pulmonary disease) (HCC)     symptoms Dr. Patricia Pesa   . CHF (congestive heart failure) (HCC)     Medications:  Scheduled:  . allopurinol  100 mg Oral Daily  . calcitRIOL  0.25 mcg Oral Daily  . collagenase  1 application Topical Daily  . folic acid  1 mg Oral Daily  . heparin lock flush  500 Units Intravenous Once  . ipratropium-albuterol  3 mL Nebulization TID  . metoprolol tartrate  25 mg Oral BID  . mometasone-formoterol  2 puff Inhalation BID  . predniSONE  20 mg Oral Q breakfast  . ursodiol  300 mg Oral BID   Assessment: Pharmacy consulted to dose Vancomycin in this 65 year old female with CKD, icterus, CHF, now growing Enterococcus in blood cultures.  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  Lab is unable to provide accurate SrCr due to icterus.  Pt receiving intermittent HD on a PRN basis.   Last HD on 1/21.   1/23: Blood cultures consistent with non Methicillin resistant  Enterococcus   Goal of Therapy:  Vancomycin trough level 15-20 mcg/ml  Plan:  Expected duration 7 days with resolution of temperature and/or normalization of WBC  Vancomycin 1 gm IV X 1 given on 1/22 @ 6:00. Vancomycin 1 gm IV X 1 given on 1/22 @ 14:00.   Will not order additional Vancomycin as loading dose at this time.   Will order Vancomycin 750 mg IV to be given at the end of each HD session PRN.  As this pt is receiving HD intermittently , will need to follow up to see if regular HD schedule is established and order Vanc trough accordingly.   No trough currently ordered.   Pt may be [facility] to [facility].    1/23: Patient scheduled to receive hemodialysis prior to being transferred to [facility] per MD Kolluru. Per MD [place], would like to continue Vancomycin since patient being transferred to [facility]. Will redose Vancomycin at Vancomycin 750 mg IV x 1 with dialysis. Pharmacy to follow   Rien Marland D 06/24/2015,2:54 PM

## 2015-06-24 NOTE — Progress Notes (Signed)
Patient ID: Jane Short, female   DOB: 1951/03/23, 65 y.o.   MRN: [id number]  Patient would like to go to [place].  [facility] just called and will open up a bed later today.

## 2015-06-24 NOTE — Progress Notes (Signed)
Total Biirubin is 42.5, [facility] doctor paged, no call back yet. Will pass it on to the incoming RN to report to the MD

## 2015-06-25 LAB — CULTURE, BLOOD (ROUTINE X 2)

## 2015-06-26 ENCOUNTER — Inpatient Hospital Stay: Payer: BLUE CROSS/BLUE SHIELD

## 2015-06-26 ENCOUNTER — Inpatient Hospital Stay: Payer: BLUE CROSS/BLUE SHIELD | Admitting: Internal Medicine

## 2015-06-26 ENCOUNTER — Encounter: Payer: Self-pay | Admitting: *Deleted

## 2015-06-26 NOTE — Progress Notes (Signed)
Patient no showed for appointment with MD. msg sent to cancer center scheduling to have this apt rescheduled.

## 2015-07-01 ENCOUNTER — Ambulatory Visit: Payer: BLUE CROSS/BLUE SHIELD | Admitting: Family

## 2015-07-02 ENCOUNTER — Other Ambulatory Visit: Payer: Self-pay | Admitting: *Deleted

## 2015-07-03 ENCOUNTER — Inpatient Hospital Stay: Payer: BLUE CROSS/BLUE SHIELD | Admitting: Internal Medicine

## 2015-07-03 ENCOUNTER — Encounter: Payer: Self-pay | Admitting: *Deleted

## 2015-07-03 ENCOUNTER — Inpatient Hospital Stay: Payer: BLUE CROSS/BLUE SHIELD

## 2015-07-03 NOTE — Progress Notes (Signed)
Per Tia in [place] stated she is not moving to [place] she has been in the hospital @ [facility] since January 1st and is currently still in the hospital. Does not want to r/s right now

## 2015-07-03 NOTE — Progress Notes (Signed)
Pt no showed for her appointment with Dr. Rogue Bussing today. Msg sent to cancer center scheduling to call patient to see her intent on keeping this appointment. Pt recently in [place] for sickle cell and needed a dialysis shunt placed on 06/14/15 due to renal failure. The patient's apt may need to be coordinated around her dialysis appointments. I spoke to [place], CMA in cancer center. He recalls that the patient was contemplating on moving to [place], but he does not know if the pt has moved at this time.

## 2015-07-26 ENCOUNTER — Telehealth: Payer: Self-pay | Admitting: Internal Medicine

## 2015-07-26 NOTE — Telephone Encounter (Signed)
Patient was discharged from [facility] on 2.17.17. Husband called today to schedule hospital follow up.

## 2015-07-26 NOTE — Telephone Encounter (Signed)
Thank you for scheduling the appointment.  Does not qualify for TCM due to [facility].

## 2015-07-30 ENCOUNTER — Encounter: Payer: Self-pay | Admitting: Internal Medicine

## 2015-07-30 ENCOUNTER — Other Ambulatory Visit: Payer: Self-pay | Admitting: Family Medicine

## 2015-07-30 ENCOUNTER — Ambulatory Visit (INDEPENDENT_AMBULATORY_CARE_PROVIDER_SITE_OTHER): Payer: BLUE CROSS/BLUE SHIELD | Admitting: Internal Medicine

## 2015-07-30 VITALS — BP 116/64 | HR 88 | Temp 97.9°F | Resp 18 | Wt 173.2 lb

## 2015-07-30 DIAGNOSIS — I482 Chronic atrial fibrillation, unspecified: Secondary | ICD-10-CM

## 2015-07-30 DIAGNOSIS — B9681 Helicobacter pylori [H. pylori] as the cause of diseases classified elsewhere: Secondary | ICD-10-CM

## 2015-07-30 DIAGNOSIS — D57219 Sickle-cell/Hb-C disease with crisis, unspecified: Secondary | ICD-10-CM | POA: Diagnosis not present

## 2015-07-30 DIAGNOSIS — I4891 Unspecified atrial fibrillation: Secondary | ICD-10-CM | POA: Insufficient documentation

## 2015-07-30 DIAGNOSIS — R17 Unspecified jaundice: Secondary | ICD-10-CM | POA: Diagnosis not present

## 2015-07-30 DIAGNOSIS — N183 Chronic kidney disease, stage 3 unspecified: Secondary | ICD-10-CM

## 2015-07-30 DIAGNOSIS — R7881 Bacteremia: Secondary | ICD-10-CM | POA: Diagnosis not present

## 2015-07-30 DIAGNOSIS — B952 Enterococcus as the cause of diseases classified elsewhere: Secondary | ICD-10-CM

## 2015-07-30 DIAGNOSIS — G4700 Insomnia, unspecified: Secondary | ICD-10-CM

## 2015-07-30 DIAGNOSIS — A048 Other specified bacterial intestinal infections: Secondary | ICD-10-CM | POA: Insufficient documentation

## 2015-07-30 MED ORDER — ZALEPLON 5 MG PO CAPS: 5.0000 mg | ORAL_CAPSULE | Freq: Every evening | ORAL | Status: DC | PRN

## 2015-07-30 NOTE — Assessment & Plan Note (Addendum)
Called and spoke with NP in [facility] [place]. Pt hgb today at [facility]. Jane Short will set up PRBC transfusion this week.

## 2015-07-30 NOTE — Assessment & Plan Note (Signed)
Recent worsening insomnia. Will add low dose of Sonata. Discussed risks of medications to help with sleep, particularly in setting of CKD and liver disease.

## 2015-07-30 NOTE — Patient Instructions (Addendum)
Start Sonata 5mg  daily as needed at bedtime to help with sleep.  Follow up with Dr. Clayborn Bigness regarding medication changes.  Follow up with Dr. Rolly Salter regarding kidney disease.  [employer] will call to set up PRBC transfusion this week.

## 2015-07-30 NOTE — Progress Notes (Signed)
Subjective:    Patient ID: Jane Short, female    DOB: 21-Sep-1950, 65 y.o.   MRN: [id number]  HPI  65YO female presents for hospital follow up.  Recent admission at [facility] 06/05/2015 for acute pulmonary edema, hypoxia, afib with RVR. Transferred to [facility] 06/24/2015. Hospital course notes are reviewed in [place].  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   Acute renal failure - Followed by Dr. Rolly Salter. Required HD during recent hospitalization.  Enterococcus bactermia - On Vancomycin qod. Husband administering at home through PICC line. Trough level today 20. Plans to continue through March 9th.  Afib with RVR - Started on Amiodarone during hospitalization. Completed Holter monitor. Follow up with cardiology was completed locally with Dr. Clayborn Bigness. Pt discussed starting anti-coagulation but this is on hold for now.  H. Pylori infection - Completed antibiotics.   Seen at [facility] by ID this morning. Continued on Vancomycin for possible endocarditis and gram-positive bacteremia (enterococcus).  Labs today showed decrease in Hgb to 5.3, which was down from 7.4. She does not have follow up with hematology here.   Feeling good. Having some trouble sleeping ever since hospitalization. Received medication inpatient for this but unsure name. Trouble both falling and staying asleep. Not currently taking anything.   Wt Readings from Last 3 Encounters:  07/30/15 173 lb 4 oz (78.586 kg)  06/24/15 197 lb 1.5 oz (89.4 kg)  05/30/15 206 lb (93.441 kg)   BP Readings from Last 3 Encounters:  07/30/15 116/64  06/24/15 131/56  05/30/15 112/58    Past Medical History  Diagnosis Date  . Sickle cell anemia (HCC)     sickle cell disease  . Vitamin D deficiency   . Hypertension   . Osteoporosis 2011    osteopenia  . 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 Cheree Ditto at [facility]  . Gout   . Sickle cell anemia (HCC)   . Lichen  planus   . SCA-1 (spinocerebellar ataxia type 1) ([place])   . COPD (chronic obstructive pulmonary disease) (HCC)     symptoms Dr. Patricia Pesa   . CHF (congestive heart failure) (HCC)    Family History  Problem Relation Age of Onset  . Heart disease Father   . Diabetes Father   . Diabetes Sister   . Cancer Mother     breast cancer  . Cancer Maternal Aunt     Breast cancer   Past Surgical History  Procedure Laterality Date  . Tubal ligation    . Gallbladder surgery    . Colonoscopy  2014    [facility]  . Peripheral vascular catheterization Left 06/14/2015    Procedure: Dialysis/Perma Catheter Insertion;  Surgeon: Katha Cabal, MD;  Location: [place] [facility];  Service: Cardiovascular;  Laterality: Left;   Social History   Social History  . Marital Status: Married    Spouse Name: N/A  . Number of Children: 1  . Years of Education: N/A   Occupational History  .      textiles   Social History Main Topics  . Smoking status: Former Smoker -- 0.25 packs/day for 30 years    Types: Cigarettes    Quit date: 04/29/2015  . Smokeless tobacco: Never Used     Comment: quit 1 month ago  . Alcohol Use: No  . Drug Use: No  . Sexual Activity: Not Asked   Other Topics Concern  . None   Social History  Narrative   Regular Exercise -  NO   Daily Caffeine Use:  1 cup coffee   Lives with her sister.   Working as a Building control surveyor in [place]  Constitutional: Negative for fever, chills, appetite change, fatigue and unexpected weight change.  Eyes: Negative for visual disturbance.  Respiratory: Positive for shortness of breath (occasional). Negative for cough and chest tightness.   Cardiovascular: Negative for chest pain and leg swelling.  Gastrointestinal: Negative for nausea, vomiting, abdominal pain, diarrhea, constipation and blood in stool.  Musculoskeletal: Negative for myalgias and arthralgias.  Skin: Negative for color change and rash.    Hematological: Negative for adenopathy. Does not bruise/bleed easily.  Psychiatric/Behavioral: Negative for sleep disturbance and dysphoric mood. The patient is not nervous/anxious.        Objective:    BP 116/64 mmHg  Pulse 88  Temp(Src) 97.9 F (36.6 C) (Oral)  Resp 18  Wt 173 lb 4 oz (78.586 kg)  SpO2 98% Physical Exam  Constitutional: She is oriented to person, place, and time. She appears well-developed and well-nourished. No distress.  HENT:  Head: Normocephalic and atraumatic.  Right Ear: External ear normal.  Left Ear: External ear normal.  Nose: Nose normal.  Mouth/Throat: Oropharynx is clear and moist. No oropharyngeal exudate.  Eyes: Conjunctivae are normal. Pupils are equal, round, and reactive to light. Right eye exhibits no discharge. Left eye exhibits no discharge. No scleral icterus.  Neck: Normal range of motion. Neck supple. No tracheal deviation present. No thyromegaly present.  Cardiovascular: Normal rate, regular rhythm and intact distal pulses.  Exam reveals no gallop and no friction rub.   Murmur heard. Pulmonary/Chest: Effort normal and breath sounds normal. No accessory muscle usage. No respiratory distress. She has no decreased breath sounds. She has no wheezes. She has no rhonchi. She has no rales. She exhibits no tenderness.  Musculoskeletal: Normal range of motion. She exhibits no edema or tenderness.  Lymphadenopathy:    She has no cervical adenopathy.  Neurological: She is alert and oriented to person, place, and time. No cranial nerve deficit. She exhibits normal muscle tone. Coordination normal.  Skin: Skin is warm and dry. No rash noted. She is not diaphoretic. No erythema. No pallor.  Psychiatric: She has a normal mood and affect. Her behavior is normal. Judgment and thought content normal.          Assessment & Plan:  Over 78min of which >50% spent in face-to-face contact with patient and her husband discussing plan of care for multiple  ongoing medical issues.  Problem List Items Addressed This Visit      Unprioritized   Atrial fibrillation ([place])    Recent admission with AFIB with RVR. NSR on exam today. On amiodarone. Seen by cardiology, however notes not available. Encouraged follow up with cardiology to discuss prolonged use of Amiodarone and possible anticoagulation.      Relevant Medications   amiodarone (PACERONE) 200 MG tablet   diltiazem (CARDIZEM) 30 MG tablet   furosemide (LASIX) 80 MG tablet   Bacteremia due to Enterococcus - Primary    Recent hospitalization with Enterococcal bacteremia. Question of MV endocarditis. On Vancomycin. Vanc trough drawn today at [facility] 20. Reviewed notes from ID at [facility]. Plan to continue Vancomycin through  3/9.      Chronic kidney disease (CKD), stage III (moderate) (Chronic)    Cr from [facility] today 2.9, which appears to be stable.  Encouraged follow up with Dr. Rolly Salter. She required HD during recent hospitalization at [facility] and [facility].      Elevated bilirubin    Reviewed notes from [place], including notes from liver biopsy. Recommended follow up with GI either locally or at [facility]. LFTs today performed at [facility] continue to be elevated. She will follow up with Dr. Candace Cruise or Dr. Gustavo Lah at [place].      H. pylori infection    Completed antibiotics. She believes a stool study to test for cure has been sent. Will follow.      Insomnia    Recent worsening insomnia. Will add low dose of Sonata. Discussed risks of medications to help with sleep, particularly in setting of CKD and liver disease.      Relevant Medications   zaleplon (SONATA) 5 MG capsule   Sickle-cell/Hb-C disease with crisis [facility])    Called and spoke with NP in [facility]ology Clinic, [place]. Pt hgb today at [facility] 5. Magda Paganini will set up PRBC transfusion this week.          Return in about 4 weeks (around 08/27/2015) for Recheck.  Ronette Deter, MD Internal Medicine [facility]

## 2015-07-30 NOTE — Assessment & Plan Note (Signed)
Reviewed notes from [place], including notes from liver biopsy. Recommended follow up with GI either locally or at [facility]. LFTs today performed at [facility] continue to be elevated. She will follow up with Dr. Candace Cruise or Dr. Gustavo Lah at [place].

## 2015-07-30 NOTE — Assessment & Plan Note (Signed)
Cr from [facility] today 2.9, which appears to be stable. Encouraged follow up with Dr. Rolly Salter. She required HD during recent hospitalization at [facility] and [facility].

## 2015-07-30 NOTE — Assessment & Plan Note (Signed)
Recent admission with AFIB with RVR. NSR on exam today. On amiodarone. Seen by cardiology, however notes not available. Encouraged follow up with cardiology to discuss prolonged use of Amiodarone and possible anticoagulation.

## 2015-07-30 NOTE — Progress Notes (Signed)
Pre visit review using our clinic review tool, if applicable. No additional management support is needed unless otherwise documented below in the visit note. 

## 2015-07-30 NOTE — Assessment & Plan Note (Signed)
Recent hospitalization with Enterococcal bacteremia. Question of MV endocarditis. On Vancomycin. Vanc trough drawn today at [facility] 20. Reviewed notes from ID at [facility]. Plan to continue Vancomycin through  3/9.

## 2015-07-30 NOTE — Assessment & Plan Note (Signed)
Completed antibiotics. She believes a stool study to test for cure has been sent. Will follow.

## 2015-07-31 ENCOUNTER — Inpatient Hospital Stay: Payer: BLUE CROSS/BLUE SHIELD | Attending: Family Medicine

## 2015-07-31 ENCOUNTER — Inpatient Hospital Stay (HOSPITAL_BASED_OUTPATIENT_CLINIC_OR_DEPARTMENT_OTHER): Payer: BLUE CROSS/BLUE SHIELD | Admitting: Family Medicine

## 2015-07-31 ENCOUNTER — Other Ambulatory Visit: Payer: Self-pay | Admitting: Family Medicine

## 2015-07-31 ENCOUNTER — Inpatient Hospital Stay: Payer: BLUE CROSS/BLUE SHIELD

## 2015-07-31 VITALS — BP 116/64 | HR 84 | Temp 99.0°F | Resp 20

## 2015-07-31 VITALS — BP 108/62 | HR 91 | Temp 98.4°F | Resp 18 | Wt 176.4 lb

## 2015-07-31 DIAGNOSIS — I509 Heart failure, unspecified: Secondary | ICD-10-CM | POA: Diagnosis not present

## 2015-07-31 DIAGNOSIS — D151 Benign neoplasm of heart: Secondary | ICD-10-CM | POA: Insufficient documentation

## 2015-07-31 DIAGNOSIS — D638 Anemia in other chronic diseases classified elsewhere: Secondary | ICD-10-CM | POA: Diagnosis not present

## 2015-07-31 DIAGNOSIS — J449 Chronic obstructive pulmonary disease, unspecified: Secondary | ICD-10-CM

## 2015-07-31 DIAGNOSIS — M109 Gout, unspecified: Secondary | ICD-10-CM | POA: Insufficient documentation

## 2015-07-31 DIAGNOSIS — N189 Chronic kidney disease, unspecified: Principal | ICD-10-CM

## 2015-07-31 DIAGNOSIS — I129 Hypertensive chronic kidney disease with stage 1 through stage 4 chronic kidney disease, or unspecified chronic kidney disease: Secondary | ICD-10-CM | POA: Insufficient documentation

## 2015-07-31 DIAGNOSIS — M818 Other osteoporosis without current pathological fracture: Secondary | ICD-10-CM | POA: Diagnosis not present

## 2015-07-31 DIAGNOSIS — D571 Sickle-cell disease without crisis: Secondary | ICD-10-CM | POA: Insufficient documentation

## 2015-07-31 DIAGNOSIS — D631 Anemia in chronic kidney disease: Secondary | ICD-10-CM

## 2015-07-31 DIAGNOSIS — R05 Cough: Secondary | ICD-10-CM | POA: Insufficient documentation

## 2015-07-31 DIAGNOSIS — Z87891 Personal history of nicotine dependence: Secondary | ICD-10-CM

## 2015-07-31 DIAGNOSIS — E559 Vitamin D deficiency, unspecified: Secondary | ICD-10-CM

## 2015-07-31 DIAGNOSIS — R270 Ataxia, unspecified: Secondary | ICD-10-CM | POA: Insufficient documentation

## 2015-07-31 DIAGNOSIS — I38 Endocarditis, valve unspecified: Secondary | ICD-10-CM | POA: Insufficient documentation

## 2015-07-31 DIAGNOSIS — L439 Lichen planus, unspecified: Secondary | ICD-10-CM

## 2015-07-31 DIAGNOSIS — D508 Other iron deficiency anemias: Secondary | ICD-10-CM

## 2015-07-31 DIAGNOSIS — D72829 Elevated white blood cell count, unspecified: Secondary | ICD-10-CM

## 2015-07-31 DIAGNOSIS — R609 Edema, unspecified: Secondary | ICD-10-CM | POA: Insufficient documentation

## 2015-07-31 DIAGNOSIS — R0602 Shortness of breath: Secondary | ICD-10-CM | POA: Insufficient documentation

## 2015-07-31 LAB — PREPARE RBC (CROSSMATCH)

## 2015-07-31 LAB — FERRITIN: FERRITIN: 273 ng/mL (ref 11–307)

## 2015-07-31 LAB — COMPREHENSIVE METABOLIC PANEL
ALBUMIN: 2.6 g/dL — AB (ref 3.5–5.0)
ALK PHOS: 192 U/L — AB (ref 38–126)
ALT: 23 U/L (ref 14–54)
ANION GAP: 7 (ref 5–15)
AST: 38 U/L (ref 15–41)
BILIRUBIN TOTAL: 6.2 mg/dL — AB (ref 0.3–1.2)
BUN: 53 mg/dL — ABNORMAL HIGH (ref 6–20)
CO2: 19 mmol/L — ABNORMAL LOW (ref 22–32)
Calcium: 8.7 mg/dL — ABNORMAL LOW (ref 8.9–10.3)
Chloride: 106 mmol/L (ref 101–111)
Creatinine, Ser: 2.85 mg/dL — ABNORMAL HIGH (ref 0.44–1.00)
GFR calc Af Amer: 19 mL/min — ABNORMAL LOW (ref >60)
GFR calc non Af Amer: 16 mL/min — ABNORMAL LOW (ref >60)
Glucose, Bld: 108 mg/dL — ABNORMAL HIGH (ref 65–99)
Potassium: 4.1 mmol/L (ref 3.5–5.1)
Sodium: 132 mmol/L — ABNORMAL LOW (ref 135–145)
Total Protein: 6.1 g/dL — ABNORMAL LOW (ref 6.5–8.1)

## 2015-07-31 LAB — CBC WITH DIFFERENTIAL/PLATELET
BASOS PCT: 2 %
Band Neutrophils: 2 %
Basophils Absolute: 0.3 10*3/uL — ABNORMAL HIGH (ref 0–0.1)
Eosinophils Absolute: 0.4 10*3/uL (ref 0–0.7)
Eosinophils Relative: 3 %
HEMATOCRIT: 16.3 % — AB (ref 35.0–47.0)
HEMOGLOBIN: 5.5 g/dL — AB (ref 12.0–16.0)
LYMPHS ABS: 2.9 10*3/uL (ref 1.0–3.6)
Lymphocytes Relative: 22 %
MCH: 32.8 pg (ref 26.0–34.0)
MCHC: 33.6 g/dL (ref 32.0–36.0)
MCV: 97.4 fL (ref 80.0–100.0)
MYELOCYTES: 1 %
Metamyelocytes Relative: 1 %
Monocytes Absolute: 1.2 10*3/uL — ABNORMAL HIGH (ref 0.2–0.9)
Monocytes Relative: 9 %
NEUTROS PCT: 60 %
NRBC: 17 /100{WBCs} — AB
Neutro Abs: 8.3 10*3/uL — ABNORMAL HIGH (ref 1.4–6.5)
Platelets: 198 10*3/uL (ref 150–440)
RBC: 1.67 MIL/uL — AB (ref 3.80–5.20)
RDW: 29.6 % — ABNORMAL HIGH (ref 11.5–14.5)
WBC: 13.1 10*3/uL — AB (ref 3.6–11.0)

## 2015-07-31 LAB — IRON AND TIBC
IRON: 100 ug/dL (ref 28–170)
SATURATION RATIOS: 36 % — AB (ref 10.4–31.8)
TIBC: 276 ug/dL (ref 250–450)
UIBC: 176 ug/dL

## 2015-07-31 LAB — SAMPLE TO BLOOD BANK

## 2015-07-31 MED ORDER — ACETAMINOPHEN 325 MG PO TABS
650.0000 mg | ORAL_TABLET | Freq: Once | ORAL | Status: AC
  Administered 2015-07-31: 650 mg via ORAL
  Filled 2015-07-31: qty 2

## 2015-07-31 MED ORDER — SODIUM CHLORIDE 0.9 % IV SOLN
250.0000 mL | Freq: Once | INTRAVENOUS | Status: AC
  Administered 2015-07-31: 250 mL via INTRAVENOUS
  Filled 2015-07-31: qty 250

## 2015-07-31 MED ORDER — DIPHENHYDRAMINE HCL 25 MG PO CAPS
25.0000 mg | ORAL_CAPSULE | Freq: Once | ORAL | Status: AC
  Administered 2015-07-31: 25 mg via ORAL
  Filled 2015-07-31: qty 1

## 2015-07-31 NOTE — Progress Notes (Signed)
[place]  [phone]  [phone]     Jane Short DOB: [birth year]  MR#: [id number]  OIN#:[id number]  Patient Care Team: Jackolyn Confer, MD as PCP - General (Internal Medicine) Seeplaputhur Robinette Haines, MD (General Surgery) Rubbie Battiest, NP as Nurse Practitioner (Gerontology)  CHIEF COMPLAINT:  Chief Complaint  Patient presents with  . 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 On Hydrea 709 mg daily and folic acid. Also has anemia of chronic renal insufficiency (serum creatinine by Dr. Juleen China on 08/11/12 was 1.95, with calculated creatinine clearance of 35-40 mL/min). 08/12/12 - serum EPO level was 268.6. On Procrit therapy and intermittent PRBC transfusion support.  2. Iron overload, on EXJADE 1 tablet daily.  Bone marrow biopsy 04/25/13 - No diagnostic evidence of myelodysplasia. Hypercellular marrow for age with a marked expansion of the erythroid compartment, adequate myelopoiesis and megakaryopoiesis without increased blasts (0.3%). A few small nondiagnostic lymphoid aggregates are noted. No significant increase in marrow reticulin fibers. Abundant storage iron is present. Cytogenetics - insufficient sample to perform.  INTERVAL HISTORY:  Patient is here for further follow-up and treatment consideration regarding chronic anemia with known sickle cell disease, but his transfusion dependent. She is currently on Hydrea 500 mg daily as well as folic acid. Patient also with history of anemia with chronic renal insufficiency. She overall reports feeling very well however, her hemoglobin today is 5.5. She does have chronic cough and dyspnea on exertion that are at baseline with no acute changes. She does have history of sickle cell disease. Patient was recently admitted and discharged from [facility] with congestive  heart failure and endocarditis. She is currently receiving vancomycin via a PICC line at home. Her discontinue date for vancomycin as 08/08/2015. She was seen in her primary care doctor's office yesterday with a hemoglobin of 5.4.   REVIEW OF SYSTEMS:   Review of Systems  Constitutional: Negative for fever, chills, weight loss, malaise/fatigue and diaphoresis.  HENT: Negative for congestion, ear discharge, ear pain, hearing loss, nosebleeds, sore throat and tinnitus.   Eyes: Negative for blurred vision, double vision, photophobia, pain, discharge and redness.  Respiratory: Negative for cough, hemoptysis, sputum production, shortness of breath, wheezing and stridor.   Cardiovascular: Positive for leg swelling. Negative for chest pain, palpitations, orthopnea, claudication and PND.  Gastrointestinal: Negative for heartburn, nausea, vomiting, abdominal pain, diarrhea, constipation, blood in stool and melena.  Genitourinary: Negative.   Musculoskeletal: Negative.   Skin: Negative.        Patient with history of lichen planus, has open wound on the left shin  Neurological: Negative for dizziness, tingling, focal weakness, seizures, weakness and headaches.  Endo/Heme/Allergies: Does not bruise/bleed easily.  Psychiatric/Behavioral: Negative for depression. The patient is not nervous/anxious and does not have insomnia.     As per HPI. Otherwise, a complete review of systems is negatve.   PAST MEDICAL HISTORY: Past Medical History  Diagnosis Date  . Sickle cell anemia (HCC)     sickle cell disease  . Vitamin D deficiency   . Hypertension   . Osteoporosis 2011    osteopenia  . 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 Cheree Ditto at [facility]  . Gout   . Sickle cell anemia (HCC)   . Lichen planus   . SCA-1 (spinocerebellar ataxia type 1) ([place])   . COPD (chronic obstructive  pulmonary disease) ([place])     symptoms Dr. Patricia Pesa   . CHF (congestive heart failure) ([place])     PAST SURGICAL  HISTORY: Past Surgical History  Procedure Laterality Date  . Tubal ligation    . Gallbladder surgery    . Colonoscopy  2014    [facility]  . Peripheral vascular catheterization Left 06/14/2015    Procedure: Dialysis/Perma Catheter Insertion;  Surgeon: Katha Cabal, MD;  Location: [place] [facility];  Service: Cardiovascular;  Laterality: Left;    FAMILY HISTORY Family History  Problem Relation Age of Onset  . Heart disease Father   . Diabetes Father   . Diabetes Sister   . Cancer Mother     breast cancer  . Cancer Maternal Aunt     Breast cancer    GYNECOLOGIC HISTORY:  No LMP recorded. Patient is postmenopausal.     ADVANCED DIRECTIVES:    HEALTH MAINTENANCE: Social History  Substance Use Topics  . Smoking status: Former Smoker -- 0.25 packs/day for 30 years    Types: Cigarettes    Quit date: 04/29/2015  . Smokeless tobacco: Never Used     Comment: quit 1 month ago  . Alcohol Use: No     Allergies  Allergen Reactions  . Ramipril     cough    Current Outpatient Prescriptions  Medication Sig Dispense Refill  . allopurinol (ZYLOPRIM) 100 MG tablet Take 100 mg by mouth daily. Reported on 07/30/2015    . amiodarone (PACERONE) 200 MG tablet Take 200 mg by mouth daily.    . calcitRIOL (ROCALTROL) 0.25 MCG capsule Take 0.25 mcg by mouth daily.    Marland Kitchen diltiazem (CARDIZEM) 30 MG tablet Take 30 mg by mouth 3 (three) times daily.    . folic acid (FOLVITE) 1 MG tablet Take 1 mg by mouth daily.    . furosemide (LASIX) 80 MG tablet Take 80 mg by mouth daily.  0  . mometasone-formoterol (DULERA) 200-5 MCG/ACT AERO Inhale 2 puffs into the lungs 2 (two) times daily. 1 Inhaler 12  . potassium chloride (K-DUR) 10 MEQ tablet Take 10 mEq by mouth daily.  0  . SANTYL ointment Apply 1 application topically daily as needed. Reported on 07/30/2015    . Umeclidinium Bromide (INCRUSE ELLIPTA) 62.5 MCG/INH AEPB Inhale 1 puff into the lungs daily. 30 each 5  . vancomycin (VANCOCIN) 1  GM/200ML SOLN Pharmacy dosing 4000 mL   . zaleplon (SONATA) 5 MG capsule Take 1 capsule (5 mg total) by mouth at bedtime as needed for sleep. 30 capsule 3   No current facility-administered medications for this visit.   Facility-Administered Medications Ordered in Other Visits  Medication Dose Route Frequency Provider Last Rate Last Dose  . heparin lock flush 100 unit/mL  250 Units Intracatheter PRN Leia Alf, MD        OBJECTIVE: BP 108/62 mmHg  Pulse 91  Temp(Src) 98.4 F (36.9 C) (Tympanic)  Resp 18  Wt 176 lb 5.9 oz (80 kg)   Body mass index is 27.62 kg/(m^2).    ECOG FS:0 - Asymptomatic  General: Well-developed, well-nourished, no acute distress. Eyes: Pink conjunctiva, Icteric sclera. HEENT: Normocephalic, moist mucous membranes, clear oropharnyx. Lungs: Clear to auscultation bilaterally. Heart: Regular rate and rhythm. No rubs, murmurs, or gallops. Abdomen: Soft, nontender, nondistended. No organomegaly noted, normoactive bowel sounds. Musculoskeletal: Bilateral lower extremity edema, previously evaluated negative for DVT, no cyanosis, or clubbing. Neuro: Alert, answering all questions appropriately. Cranial nerves grossly intact. Skin: Patient  appears jaundice. Patient has reported history of lichen planus over bilateral lower extremities. Left lower extremity with open wound that is draining serous fluid. Psych: Normal affect.    LAB RESULTS:  Appointment on 07/31/2015  Component Date Value Ref Range Status  . WBC 07/31/2015 13.1* 3.6 - 11.0 K/uL Final   ADJUSTED FOR NUCLEATED RBC'S  . RBC 07/31/2015 1.67* 3.80 - 5.20 MIL/uL Final  . Hemoglobin 07/31/2015 5.5* 12.0 - 16.0 g/dL Final  . HCT 07/31/2015 16.3* 35.0 - 47.0 % Final  . MCV 07/31/2015 97.4  80.0 - 100.0 fL Final  . MCH 07/31/2015 32.8  26.0 - 34.0 pg Final  . MCHC 07/31/2015 33.6  32.0 - 36.0 g/dL Final  . RDW 07/31/2015 29.6* 11.5 - 14.5 % Final  . Platelets 07/31/2015 198  150 - 440 K/uL Final  .  Neutrophils Relative % 07/31/2015 60   Final  . Lymphocytes Relative 07/31/2015 22   Final  . Monocytes Relative 07/31/2015 9   Final  . Eosinophils Relative 07/31/2015 3   Final  . Basophils Relative 07/31/2015 2   Final  . Band Neutrophils 07/31/2015 2   Final  . Metamyelocytes Relative 07/31/2015 1   Final  . Myelocytes 07/31/2015 1   Final  . nRBC 07/31/2015 17* 0 /100 WBC Final  . Neutro Abs 07/31/2015 8.3* 1.4 - 6.5 K/uL Final  . Lymphs Abs 07/31/2015 2.9  1.0 - 3.6 K/uL Final  . Monocytes Absolute 07/31/2015 1.2* 0.2 - 0.9 K/uL Final  . Eosinophils Absolute 07/31/2015 0.4  0 - 0.7 K/uL Final  . Basophils Absolute 07/31/2015 0.3* 0 - 0.1 K/uL Final  . RBC Morphology 07/31/2015 MIXED RBC POPULATION   Final   Comment: MARKED POLYCHROMASIA ELLIPTOCYTES SICKLE CELLS TARGET CELLS HYPOCHROMIA MICROCYTES   . Sodium 07/31/2015 132* 135 - 145 mmol/L Final  . Potassium 07/31/2015 4.1  3.5 - 5.1 mmol/L Final  . Chloride 07/31/2015 106  101 - 111 mmol/L Final  . CO2 07/31/2015 19* 22 - 32 mmol/L Final  . Glucose, Bld 07/31/2015 108* 65 - 99 mg/dL Final  . BUN 07/31/2015 53* 6 - 20 mg/dL Final  . Creatinine, Ser 07/31/2015 2.85* 0.44 - 1.00 mg/dL Final  . Calcium 07/31/2015 8.7* 8.9 - 10.3 mg/dL Final  . Total Protein 07/31/2015 6.1* 6.5 - 8.1 g/dL Final  . Albumin 07/31/2015 2.6* 3.5 - 5.0 g/dL Final  . AST 07/31/2015 38  15 - 41 U/L Final  . ALT 07/31/2015 23  14 - 54 U/L Final  . Alkaline Phosphatase 07/31/2015 192* 38 - 126 U/L Final  . Total Bilirubin 07/31/2015 6.2* 0.3 - 1.2 mg/dL Final  . GFR calc non Af Amer 07/31/2015 16* >60 mL/min Final  . GFR calc Af Amer 07/31/2015 19* >60 mL/min Final   Comment: (NOTE) The eGFR has been calculated using the CKD EPI equation. This calculation has not been validated in all clinical situations. eGFR's persistently <60 mL/min signify possible Chronic Kidney Disease.   . Anion gap 07/31/2015 7  5 - 15 Final  . Iron 07/31/2015 100  28 -  170 ug/dL Final  . TIBC 07/31/2015 276  250 - 450 ug/dL Final  . Saturation Ratios 07/31/2015 36* 10.4 - 31.8 % Final  . UIBC 07/31/2015 176   Final  . Ferritin 07/31/2015 273  11 - 307 ng/mL Final  . Blood Bank Specimen 07/31/2015 SAMPLE AVAILABLE FOR TESTING   Final  . Sample Expiration 07/31/2015 08/03/2015   Final  STUDIES: No results found.  ASSESSMENT: Chronic anemia, related to sickle cell disease as well as anemia of chronic renal insufficiency.  PLAN:   1. Chronic anemia, sickle cell disease. Patient was previously on hydroxyurea and  Procrit 40,000 units  injections weekly and on folic acid 1 mg daily. Bone marrow biopsy done on 04/25/13 is negative for underlying marrow disorder like myelodysplasia. Hemoglobin continues to remain low but fluctuates within a steady range of 5.3 to 6.9, occasionally it goes > 7g after transfusion support. Since discharge from hospital with congestive heart failure and endocarditis  Procrit and hydroxyurea have not been continued. As hemoglobin is 5.5 today we will transfuse 1 unit of packed red blood cells today and a second unit tomorrow March 2. 2. Recent hospitalization with congestive heart failure and endocarditis. Patient currently has a PICC line in the right upper extremity and is receiving vancomycin. She continues to follow with cardiology and infectious disease at [facility]. 3. Chronic leukocytosis. BCR-ABL is negative. Peripheral blood immunophenotyping also unremarkable. Most likely has reactive leukocytosis secondary to smoking. WBC count is normal today. Continue to monitor intermittently. 4. Iron overload. Iron studies performed  today Ferritin 273, iron 100, TIBC 276, saturation ratios of 36%. Patient was previously on Exjade 500 mg once daily. Since patient's discharge from hospital with congestive heart failure and endocarditis this has not been continued.  We'll schedule patient for follow-up to establish with new hematologist in  approximately 2 weeks.  Patient expressed understanding and was in agreement with this plan. She also understands that She can call clinic at any time with any questions, concerns, or complaints.   Dr. Oliva Bustard was available for consultation and review of plan of care for this patient.  Evlyn Kanner, NP   07/31/2015 12:19 PM

## 2015-08-01 ENCOUNTER — Inpatient Hospital Stay: Payer: BLUE CROSS/BLUE SHIELD

## 2015-08-01 VITALS — BP 114/69 | HR 75 | Temp 96.5°F | Resp 20

## 2015-08-01 DIAGNOSIS — D508 Other iron deficiency anemias: Secondary | ICD-10-CM

## 2015-08-01 DIAGNOSIS — D571 Sickle-cell disease without crisis: Secondary | ICD-10-CM | POA: Diagnosis not present

## 2015-08-01 MED ORDER — SODIUM CHLORIDE 0.9% FLUSH
10.0000 mL | INTRAVENOUS | Status: DC | PRN
  Administered 2015-08-01: 10 mL via INTRAVENOUS
  Filled 2015-08-01: qty 10

## 2015-08-01 MED ORDER — SODIUM CHLORIDE 0.9 % IV SOLN
250.0000 mL | Freq: Once | INTRAVENOUS | Status: AC
  Administered 2015-08-01: 250 mL via INTRAVENOUS
  Filled 2015-08-01: qty 250

## 2015-08-01 MED ORDER — HEPARIN SOD (PORK) LOCK FLUSH 100 UNIT/ML IV SOLN
500.0000 [IU] | Freq: Once | INTRAVENOUS | Status: AC
  Administered 2015-08-01: 500 [IU] via INTRAVENOUS
  Filled 2015-08-01: qty 5

## 2015-08-01 MED ORDER — ACETAMINOPHEN 325 MG PO TABS
650.0000 mg | ORAL_TABLET | Freq: Once | ORAL | Status: AC
  Administered 2015-08-01: 650 mg via ORAL
  Filled 2015-08-01: qty 2

## 2015-08-01 MED ORDER — DIPHENHYDRAMINE HCL 25 MG PO CAPS
25.0000 mg | ORAL_CAPSULE | Freq: Once | ORAL | Status: AC
  Administered 2015-08-01: 25 mg via ORAL
  Filled 2015-08-01: qty 1

## 2015-08-14 ENCOUNTER — Ambulatory Visit (INDEPENDENT_AMBULATORY_CARE_PROVIDER_SITE_OTHER): Payer: BLUE CROSS/BLUE SHIELD | Admitting: Internal Medicine

## 2015-08-14 ENCOUNTER — Encounter: Payer: Self-pay | Admitting: Internal Medicine

## 2015-08-14 ENCOUNTER — Other Ambulatory Visit: Payer: Self-pay | Admitting: *Deleted

## 2015-08-14 ENCOUNTER — Inpatient Hospital Stay: Payer: BLUE CROSS/BLUE SHIELD

## 2015-08-14 ENCOUNTER — Inpatient Hospital Stay (HOSPITAL_BASED_OUTPATIENT_CLINIC_OR_DEPARTMENT_OTHER): Payer: BLUE CROSS/BLUE SHIELD | Admitting: Internal Medicine

## 2015-08-14 ENCOUNTER — Encounter: Payer: Self-pay | Admitting: *Deleted

## 2015-08-14 VITALS — BP 135/65 | HR 74 | Temp 97.5°F | Resp 18 | Ht 67.5 in | Wt 186.5 lb

## 2015-08-14 VITALS — BP 122/68 | HR 93 | Ht 67.5 in | Wt 189.6 lb

## 2015-08-14 DIAGNOSIS — R270 Ataxia, unspecified: Secondary | ICD-10-CM

## 2015-08-14 DIAGNOSIS — D638 Anemia in other chronic diseases classified elsewhere: Secondary | ICD-10-CM

## 2015-08-14 DIAGNOSIS — I38 Endocarditis, valve unspecified: Secondary | ICD-10-CM

## 2015-08-14 DIAGNOSIS — N183 Chronic kidney disease, stage 3 unspecified: Secondary | ICD-10-CM

## 2015-08-14 DIAGNOSIS — J449 Chronic obstructive pulmonary disease, unspecified: Secondary | ICD-10-CM | POA: Diagnosis not present

## 2015-08-14 DIAGNOSIS — I129 Hypertensive chronic kidney disease with stage 1 through stage 4 chronic kidney disease, or unspecified chronic kidney disease: Secondary | ICD-10-CM | POA: Diagnosis not present

## 2015-08-14 DIAGNOSIS — Z87891 Personal history of nicotine dependence: Secondary | ICD-10-CM

## 2015-08-14 DIAGNOSIS — M818 Other osteoporosis without current pathological fracture: Secondary | ICD-10-CM

## 2015-08-14 DIAGNOSIS — N189 Chronic kidney disease, unspecified: Secondary | ICD-10-CM

## 2015-08-14 DIAGNOSIS — D151 Benign neoplasm of heart: Secondary | ICD-10-CM

## 2015-08-14 DIAGNOSIS — R05 Cough: Secondary | ICD-10-CM

## 2015-08-14 DIAGNOSIS — D631 Anemia in chronic kidney disease: Secondary | ICD-10-CM

## 2015-08-14 DIAGNOSIS — D57219 Sickle-cell/Hb-C disease with crisis, unspecified: Secondary | ICD-10-CM

## 2015-08-14 DIAGNOSIS — D6489 Other specified anemias: Secondary | ICD-10-CM

## 2015-08-14 DIAGNOSIS — E559 Vitamin D deficiency, unspecified: Secondary | ICD-10-CM

## 2015-08-14 DIAGNOSIS — D571 Sickle-cell disease without crisis: Secondary | ICD-10-CM

## 2015-08-14 DIAGNOSIS — D72829 Elevated white blood cell count, unspecified: Secondary | ICD-10-CM

## 2015-08-14 DIAGNOSIS — M109 Gout, unspecified: Secondary | ICD-10-CM

## 2015-08-14 DIAGNOSIS — I509 Heart failure, unspecified: Secondary | ICD-10-CM

## 2015-08-14 DIAGNOSIS — R609 Edema, unspecified: Secondary | ICD-10-CM

## 2015-08-14 DIAGNOSIS — R0602 Shortness of breath: Secondary | ICD-10-CM

## 2015-08-14 DIAGNOSIS — L439 Lichen planus, unspecified: Secondary | ICD-10-CM

## 2015-08-14 LAB — CBC WITH DIFFERENTIAL/PLATELET
BASOS PCT: 2 %
Basophils Absolute: 0.2 10*3/uL — ABNORMAL HIGH (ref 0–0.1)
EOS ABS: 0.7 10*3/uL (ref 0–0.7)
Eosinophils Relative: 7 %
HCT: 15.7 % — ABNORMAL LOW (ref 35.0–47.0)
Hemoglobin: 5.3 g/dL — ABNORMAL LOW (ref 12.0–16.0)
Lymphocytes Relative: 16 %
Lymphs Abs: 1.6 10*3/uL (ref 1.0–3.6)
MCH: 31.4 pg (ref 26.0–34.0)
MCHC: 33.9 g/dL (ref 32.0–36.0)
MCV: 92.4 fL (ref 80.0–100.0)
MONOS PCT: 14 %
Monocytes Absolute: 1.4 10*3/uL — ABNORMAL HIGH (ref 0.2–0.9)
NEUTROS ABS: 5.8 10*3/uL (ref 1.4–6.5)
NRBC: 10 /100{WBCs} — AB
Neutrophils Relative %: 61 %
PLATELETS: 271 10*3/uL (ref 150–400)
RBC: 1.7 MIL/uL — ABNORMAL LOW (ref 3.80–5.20)
RDW: 24.6 % — AB (ref 11.5–14.5)
WBC: 9.7 10*3/uL (ref 3.6–11.0)

## 2015-08-14 LAB — PREPARE RBC (CROSSMATCH)

## 2015-08-14 MED ORDER — EPOETIN ALFA 40000 UNIT/ML IJ SOLN
40000.0000 [IU] | Freq: Once | INTRAMUSCULAR | Status: AC
  Administered 2015-08-14: 40000 [IU] via SUBCUTANEOUS

## 2015-08-14 MED ORDER — ALBUTEROL SULFATE HFA 108 (90 BASE) MCG/ACT IN AERS: 2.0000 | INHALATION_SPRAY | RESPIRATORY_TRACT | Status: DC | PRN

## 2015-08-14 NOTE — Progress Notes (Signed)
[facility] OFFICE PROGRESS NOTE  Patient Care Team: Jackolyn Confer, MD as PCP - General (Internal Medicine) Seeplaputhur Robinette Haines, MD (General Surgery) Rubbie Battiest, NP as Nurse Practitioner (Gerontology)   SUMMARY OF ONCOLOGIC HISTORY:  # SICKLE CELL ANEMIA  # Acute renal failure/ CKD   # Severe Anemia  INTERVAL HISTORY:  This is my first interaction with the patient since I joined the practice September 2016.   a pleasant 65 year old African-American female patient with above history of sickle cell anemia/ chronic kidney disease and congestive heart failure  Was  Recently/  Middle of January 2017 transferred to [facility]  Elevated bilirubin up to 44/ worsening kidney  Function and also congestive heart failure.  She was also noted to have enterococcus bacteremia; question endocarditis. Received plasmapheresis/ hemodialysis.  As per the patient she also coded 2 at [facility]. Patient had been discharged home.   Patient states that her  Weight was 166 at the time of discharge;  However today it is up to 186.  She is noted to have worsening shortness of breath- Most on exertion.  She is able to lie on her back. No unusual cough.  Complains of leg swelling/ wearing TED hose.  Otherwise no fever no chills.  REVIEW OF SYSTEMS:  A complete 10 point review of system is done which is negative except mentioned above/history of present illness.   PAST MEDICAL HISTORY :  Past Medical History  Diagnosis Date  . Sickle cell anemia (HCC)     sickle cell disease  . Vitamin D deficiency   . Hypertension   . Osteoporosis 2011    osteopenia  . 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 Cheree Ditto at [facility]  . Gout   . Sickle cell anemia (HCC)   . Lichen planus   . 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 COPD (chronic obstructive pulmonary disease) (HCC)     symptoms Dr. Patricia Pesa   . CHF (congestive heart failure) ([place])   . Congestive heart failure (CHF) ([place])   . Congestive  heart failure (CHF) ([place]) 06/2015    PAST SURGICAL HISTORY :   Past Surgical History  Procedure Laterality Date  . Tubal ligation    . Gallbladder surgery    . Colonoscopy  2014    [facility]  . Peripheral vascular catheterization Left 06/14/2015    Procedure: Dialysis/Perma Catheter Insertion;  Surgeon: Katha Cabal, MD;  Location: [facility];  Service: Cardiovascular;  Laterality: Left;    FAMILY HISTORY :   Family History  Problem Relation Age of Onset  . Heart disease Father   . Diabetes Father   . Diabetes Sister   . Cancer Mother     breast cancer  . Cancer Maternal Aunt     Breast cancer    SOCIAL HISTORY:   Social History  Substance Use Topics  . Smoking status: Former Smoker -- 0.25 packs/day for 30 years    Types: Cigarettes    Quit date: 04/29/2015  . Smokeless tobacco: Never Used     Comment: quit 1 month ago  . Alcohol Use: No    ALLERGIES:  is allergic to ramipril.  MEDICATIONS:  Current Outpatient Prescriptions  Medication Sig Dispense Refill  . albuterol (PROVENTIL HFA;VENTOLIN HFA) 108 (90 Base) MCG/ACT inhaler Inhale 2 puffs into the lungs every 4 (four) hours as needed for wheezing or shortness of breath. 1 Inhaler 2  . amiodarone (PACERONE)  200 MG tablet Take 200 mg by mouth daily.    . calcitRIOL (ROCALTROL) 0.25 MCG capsule Take 0.25 mcg by mouth daily.    . folic acid (FOLVITE) 1 MG tablet Take 1 mg by mouth daily.    . furosemide (LASIX) 80 MG tablet Take 80 mg by mouth daily.  0  . mometasone-formoterol (DULERA) 200-5 MCG/ACT AERO Inhale 2 puffs into the lungs 2 (two) times daily. 1 Inhaler 12  . potassium chloride (K-DUR) 10 MEQ tablet Take 10 mEq by mouth daily.  0  . zaleplon (SONATA) 5 MG capsule Take 1 capsule (5 mg total) by mouth at bedtime as needed for sleep. 30 capsule 3   No current facility-administered medications for this visit.   Facility-Administered Medications Ordered in Other Visits  Medication Dose Route  Frequency Provider Last Rate Last Dose  . heparin lock flush 100 unit/mL  250 Units Intracatheter PRN Leia Alf, MD        PHYSICAL EXAMINATION:   BP 135/65 mmHg  Pulse 74  Temp(Src) 97.5 F (36.4 C) (Tympanic)  Resp 18  Ht 5' 7.5" (1.715 m)  Wt 186 lb 8.2 oz (84.6 kg)  BMI 28.76 kg/m2  Filed Weights   08/14/15 1147  Weight: 186 lb 8.2 oz (84.6 kg)    GENERAL: Well-nourished well-developed; Alert, no distress and comfortable.   With family.  EYES: positive for pallor/ icterus.  OROPHARYNX: no thrush or ulceration; good dentition  NECK: supple, no masses felt LYMPH:  no palpable lymphadenopathy in the cervical, axillary or inguinal regions LUNGS: clear to auscultation and  No wheeze or crackles HEART/CVS: regular rate & rhythm and no murmurs; No positive lower extremity edema ABDOMEN:abdomen soft, non-tender and normal bowel sounds Musculoskeletal:no cyanosis of digits and no clubbing  PSYCH: alert & oriented x 3 with fluent speech NEURO: no focal motor/sensory deficits SKIN:  no rashes or significant lesions  LABORATORY DATA:  I have reviewed the data as listed    Component Value Date/Time   NA 132* 07/31/2015 1028   NA 138 06/20/2012 1400   NA 141 02/20/2010   K 4.1 07/31/2015 1028   K 4.3 06/20/2012 1400   CL 106 07/31/2015 1028   CL 105 06/20/2012 1400   CO2 19* 07/31/2015 1028   CO2 24 06/20/2012 1400   GLUCOSE 108* 07/31/2015 1028   GLUCOSE 102* 06/20/2012 1400   BUN 53* 07/31/2015 1028   BUN 13 06/20/2012 1400   BUN 14 02/20/2010   CREATININE 2.85* 07/31/2015 1028   CREATININE 1.36* 03/22/2013 1522   CREATININE 1.1 02/20/2010   CALCIUM 8.7* 07/31/2015 1028   CALCIUM 9.4 06/20/2012 1400   PROT 6.1* 07/31/2015 1028   PROT 7.1 03/22/2013 1522   ALBUMIN 2.6* 07/31/2015 1028   ALBUMIN 3.7 03/22/2013 1522   AST 38 07/31/2015 1028   AST 48* 03/22/2013 1522   ALT 23 07/31/2015 1028   ALT 25 03/22/2013 1522   ALKPHOS 192* 07/31/2015 1028   ALKPHOS  139* 03/22/2013 1522   BILITOT 6.2* 07/31/2015 1028   BILITOT 4.0* 03/22/2013 1522   GFRNONAA 16* 07/31/2015 1028   GFRNONAA 42* 03/22/2013 1522   GFRAA 19* 07/31/2015 1028   GFRAA 48* 03/22/2013 1522    No results found for: SPEP, UPEP  Lab Results  Component Value Date   WBC PENDING 08/14/2015   NEUTROABS PENDING 08/14/2015   HGB 5.3* 08/14/2015   HCT 15.7* 08/14/2015   MCV 92.4 08/14/2015   PLT 271 08/14/2015  Chemistry      Component Value Date/Time   NA 132* 07/31/2015 1028   NA 138 06/20/2012 1400   NA 141 02/20/2010   K 4.1 07/31/2015 1028   K 4.3 06/20/2012 1400   CL 106 07/31/2015 1028   CL 105 06/20/2012 1400   CO2 19* 07/31/2015 1028   CO2 24 06/20/2012 1400   BUN 53* 07/31/2015 1028   BUN 13 06/20/2012 1400   BUN 14 02/20/2010   CREATININE 2.85* 07/31/2015 1028   CREATININE 1.36* 03/22/2013 1522   CREATININE 1.1 02/20/2010   GLU 89 02/20/2010      Component Value Date/Time   CALCIUM 8.7* 07/31/2015 1028   CALCIUM 9.4 06/20/2012 1400   ALKPHOS 192* 07/31/2015 1028   ALKPHOS 139* 03/22/2013 1522   AST 38 07/31/2015 1028   AST 48* 03/22/2013 1522   ALT 23 07/31/2015 1028   ALT 25 03/22/2013 1522   BILITOT 6.2* 07/31/2015 1028   BILITOT 4.0* 03/22/2013 1522         ASSESSMENT & PLAN:   # SICKLE CELL DISEASE/ # SEVERE ANEMIA-  Today hemoglobin is by  5.3;  Recommend Procrit  40,000 units weekly;  Starting today.  Also recommend units of PRBC transfusion tomorrow at the [facility].  I'll check iron status;   If needed we'll give her IV iron.  #  Chronic kidney disease/ acute renal failure-  Most recent creatinine  2.85/ Approximately 2 weeks ago.  With the weight gain  Of over 20 pounds in the last 1 month-  I would recommend evaluation with nephrology as soon as possible.  She has an appointment with nephrology  On the 17th.  I left a message for Dr. Juleen China  To discuss the above.  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  Most recently around 6.2 appx  2 weeks ago. Repeat  CMP again next week.  #  Patient will follow-up with me in approximately  4 weeks;  Continue Procrit weekly;   # 25 minutes face-to-face with the patient discussing the above plan of care; more than 50% of time spent on prognosis/ natural history; counseling and coordination.      Cammie Sickle, MD 08/14/2015 12:17 PM   addendum:  Jane Short to nephrology Dr.Kolluru-  Who feels that patient anemia is disproportionate to her renal insufficiency.  And also  Question sickle cell disease versus sickle cell trait.  Check hemoglobin electrophoresis;  LDH and reticulocyte count.  Also  Will discussed regarding a bone marrow biopsy at next visit.  #  Patient's recent iron saturation was 36%/  Ferritin 273. Recommend  IV iron  Feraheme next week.

## 2015-08-14 NOTE — Progress Notes (Signed)
Date: 08/14/2015,   MRN# [id number] Jane Short September 11, 1950 Code Status:  Hosp day:@LENGTHOFSTAYDAYS @ Referring MD: @ATDPROV @     PCP:      AdmissionWeight: 189 lb 9.6 oz (86.002 kg)                 CurrentWeight: 189 lb 9.6 oz (86.002 kg) Jane Short is a 65 y.o. old female seen in consultation for chronic SOB and cough.     CHIEF COMPLAINT:   Follow up Chronic cough and SOB   HISTORY OF PRESENT ILLNESS  Patient recently admitted to [facility] for Vtach/heart failure and Endocarditis  Patient feels much better since admission Patient could NOT perform PFT's due to excessive coughing-will order again Patient states that dulera has helped alot Patient has chronic SOB, no fevers, chills, no signs of infection at this time    Patient has daytime sleepiness, early morning fatigue and tiredness Patient with excessive snoring per husband-willorder sleep study as she missed appointment due to hospitalization     Current Medication:  Current outpatient prescriptions:  .  amiodarone (PACERONE) 200 MG tablet, Take 200 mg by mouth daily., Disp: , Rfl:  .  calcitRIOL (ROCALTROL) 0.25 MCG capsule, Take 0.25 mcg by mouth daily., Disp: , Rfl:  .  folic acid (FOLVITE) 1 MG tablet, Take 1 mg by mouth daily., Disp: , Rfl:  .  furosemide (LASIX) 80 MG tablet, Take 80 mg by mouth daily., Disp: , Rfl: 0 .  mometasone-formoterol (DULERA) 200-5 MCG/ACT AERO, Inhale 2 puffs into the lungs 2 (two) times daily., Disp: 1 Inhaler, Rfl: 12 .  potassium chloride (K-DUR) 10 MEQ tablet, Take 10 mEq by mouth daily., Disp: , Rfl: 0 .  SANTYL ointment, Apply 1 application topically daily as needed. Reported on 07/30/2015, Disp: , Rfl:  .  zaleplon (SONATA) 5 MG capsule, Take 1 capsule (5 mg total) by mouth at bedtime as needed for sleep., Disp: 30 capsule, Rfl: 3 No current facility-administered medications for this visit.  Facility-Administered Medications Ordered in Other Visits:  .  heparin  lock flush 100 unit/mL, 250 Units, Intracatheter, PRN, Leia Alf, MD    ALLERGIES   Ramipril     REVIEW OF SYSTEMS   Review of Systems  Constitutional: Negative for fever, chills, malaise/fatigue and diaphoresis.  Respiratory: Positive for shortness of breath. Negative for cough, hemoptysis, sputum production and wheezing.   Cardiovascular: Positive for leg swelling. Negative for chest pain and orthopnea.  Gastrointestinal: Negative for heartburn, nausea and abdominal pain.     VS: BP 122/68 mmHg  Pulse 93  Ht 5' 7.5" (1.715 m)  Wt 189 lb 9.6 oz (86.002 kg)  BMI 29.24 kg/m2  SpO2 98%     PHYSICAL EXAM  Physical Exam  Constitutional: She is oriented to person, place, and time. No distress.  Cardiovascular: Normal rate, regular rhythm and normal heart sounds.   No murmur heard. Pulmonary/Chest: Effort normal and breath sounds normal. No respiratory distress. She has no wheezes. She has no rales.  Abdominal: Soft. Bowel sounds are normal.  Musculoskeletal: Normal range of motion. She exhibits edema.  Neurological: She is alert and oriented to person, place, and time.  Skin: She is not diaphoretic.  Psychiatric: She has a normal mood and affect.       ASSESSMENT/PLAN    65 yo AAF with chronic cough and  chronic SOB with wheezing with chronic tobacco abuse likely related to underlying COPD with diastolic dysfunction with CKD and probable underlying OSA  1.continue Dulera as prescribed, Albuterol as needed 2.will need Sleep study to assess for OSA-will reschedule 3.will order ONO and attempt repeat PFT's  Follow up in 2 months   The Patient requires high complexity decision making for assessment and support, frequent evaluation and titration of therapies, application of advanced monitoring technologies and extensive interpretation of multiple databases.  Patient is satisfied with Plan of action and management.   Corrin Parker, M.D.  [facility]  Medical Director [place] Director [facility] Cardio-Pulmonary Department

## 2015-08-14 NOTE — Progress Notes (Signed)
Injection administered in exam room. BP today 135/65. HGB 5.3

## 2015-08-14 NOTE — Patient Instructions (Signed)
Chronic Obstructive Pulmonary Disease Chronic obstructive pulmonary disease (COPD) is a common lung condition in which airflow from the lungs is limited. COPD is a general term that can be used to describe many different lung problems that limit airflow, including both chronic bronchitis and emphysema. If you have COPD, your lung function will probably never return to normal, but there are measures you can take to improve lung function and make yourself feel better. CAUSES   Smoking (common).  Exposure to secondhand smoke.  Genetic problems.  Chronic inflammatory lung diseases or recurrent infections. SYMPTOMS  Shortness of breath, especially with physical activity.  Deep, persistent (chronic) cough with a large amount of thick mucus.  Wheezing.  Rapid breaths (tachypnea).  Gray or bluish discoloration (cyanosis) of the skin, especially in your fingers, toes, or lips.  Fatigue.  Weight loss.  Frequent infections or episodes when breathing symptoms become much worse (exacerbations).  Chest tightness. DIAGNOSIS Your health care provider will take a medical history and perform a physical examination to diagnose COPD. Additional tests for COPD may include:  Lung (pulmonary) function tests.  Chest X-ray.  CT scan.  Blood tests. TREATMENT  Treatment for COPD may include:  Inhaler and nebulizer medicines. These help manage the symptoms of COPD and make your breathing more comfortable.  Supplemental oxygen. Supplemental oxygen is only helpful if you have a low oxygen level in your blood.  Exercise and physical activity. These are beneficial for nearly all people with COPD.  Lung surgery or transplant.  Nutrition therapy to gain weight, if you are underweight.  Pulmonary rehabilitation. This may involve working with a team of health care providers and specialists, such as respiratory, occupational, and physical therapists. HOME CARE INSTRUCTIONS  Take all medicines  (inhaled or pills) as directed by your health care provider.  Avoid over-the-counter medicines or cough syrups that dry up your airway (such as antihistamines) and slow down the elimination of secretions unless instructed otherwise by your health care provider.  If you are a smoker, the most important thing that you can do is stop smoking. Continuing to smoke will cause further lung damage and breathing trouble. Ask your health care provider for help with quitting smoking. He or she can direct you to community resources or hospitals that provide support.  Avoid exposure to irritants such as smoke, chemicals, and fumes that aggravate your breathing.  Use oxygen therapy and pulmonary rehabilitation if directed by your health care provider. If you require home oxygen therapy, ask your health care provider whether you should purchase a pulse oximeter to measure your oxygen level at home.  Avoid contact with individuals who have a contagious illness.  Avoid extreme temperature and humidity changes.  Eat healthy foods. Eating smaller, more frequent meals and resting before meals may help you maintain your strength.  Stay active, but balance activity with periods of rest. Exercise and physical activity will help you maintain your ability to do things you want to do.  Preventing infection and hospitalization is very important when you have COPD. Make sure to receive all the vaccines your health care provider recommends, especially the pneumococcal and influenza vaccines. Ask your health care provider whether you need a pneumonia vaccine.  Learn and use relaxation techniques to manage stress.  Learn and use controlled breathing techniques as directed by your health care provider. Controlled breathing techniques include:  Pursed lip breathing. Start by breathing in (inhaling) through your nose for 1 second. Then, purse your lips as if you were   going to whistle and breathe out (exhale) through the  pursed lips for 2 seconds.  Diaphragmatic breathing. Start by putting one hand on your abdomen just above your waist. Inhale slowly through your nose. The hand on your abdomen should move out. Then purse your lips and exhale slowly. You should be able to feel the hand on your abdomen moving in as you exhale.  Learn and use controlled coughing to clear mucus from your lungs. Controlled coughing is a series of short, progressive coughs. The steps of controlled coughing are: 1. Lean your head slightly forward. 2. Breathe in deeply using diaphragmatic breathing. 3. Try to hold your breath for 3 seconds. 4. Keep your mouth slightly open while coughing twice. 5. Spit any mucus out into a tissue. 6. Rest and repeat the steps once or twice as needed. SEEK MEDICAL CARE IF:  You are coughing up more mucus than usual.  There is a change in the color or thickness of your mucus.  Your breathing is more labored than usual.  Your breathing is faster than usual. SEEK IMMEDIATE MEDICAL CARE IF:  You have shortness of breath while you are resting.  You have shortness of breath that prevents you from:  Being able to talk.  Performing your usual physical activities.  You have chest pain lasting longer than 5 minutes.  Your skin color is more cyanotic than usual.  You measure low oxygen saturations for longer than 5 minutes with a pulse oximeter. MAKE SURE YOU:  Understand these instructions.  Will watch your condition.  Will get help right away if you are not doing well or get worse.   Continue dulera

## 2015-08-15 ENCOUNTER — Inpatient Hospital Stay: Payer: BLUE CROSS/BLUE SHIELD

## 2015-08-15 ENCOUNTER — Encounter: Payer: Self-pay | Admitting: Internal Medicine

## 2015-08-15 VITALS — BP 126/67 | HR 89 | Temp 98.1°F | Resp 20

## 2015-08-15 DIAGNOSIS — D6489 Other specified anemias: Secondary | ICD-10-CM

## 2015-08-15 DIAGNOSIS — D571 Sickle-cell disease without crisis: Secondary | ICD-10-CM | POA: Diagnosis not present

## 2015-08-15 LAB — SAMPLE TO BLOOD BANK

## 2015-08-15 MED ORDER — HEPARIN SOD (PORK) LOCK FLUSH 100 UNIT/ML IV SOLN: 500.0000 [IU] | Freq: Every day | INTRAVENOUS | Status: DC | PRN

## 2015-08-15 MED ORDER — SODIUM CHLORIDE 0.9% FLUSH
10.0000 mL | INTRAVENOUS | Status: DC | PRN
  Filled 2015-08-15: qty 10

## 2015-08-15 MED ORDER — SODIUM CHLORIDE 0.9 % IV SOLN
250.0000 mL | Freq: Once | INTRAVENOUS | Status: AC
  Administered 2015-08-15: 250 mL via INTRAVENOUS
  Filled 2015-08-15: qty 250

## 2015-08-15 MED ORDER — ACETAMINOPHEN 325 MG PO TABS
650.0000 mg | ORAL_TABLET | Freq: Once | ORAL | Status: AC
  Administered 2015-08-15: 650 mg via ORAL
  Filled 2015-08-15: qty 2

## 2015-08-15 MED ORDER — DIPHENHYDRAMINE HCL 25 MG PO CAPS
25.0000 mg | ORAL_CAPSULE | Freq: Once | ORAL | Status: AC
  Administered 2015-08-15: 25 mg via ORAL
  Filled 2015-08-15: qty 1

## 2015-08-15 MED ORDER — FUROSEMIDE 10 MG/ML IJ SOLN
20.0000 mg | Freq: Once | INTRAMUSCULAR | Status: AC
  Administered 2015-08-15: 20 mg via INTRAVENOUS
  Filled 2015-08-15: qty 2

## 2015-08-20 ENCOUNTER — Inpatient Hospital Stay: Payer: BLUE CROSS/BLUE SHIELD

## 2015-08-21 ENCOUNTER — Other Ambulatory Visit: Payer: Self-pay | Admitting: *Deleted

## 2015-08-21 ENCOUNTER — Other Ambulatory Visit: Payer: Self-pay | Admitting: Internal Medicine

## 2015-08-21 ENCOUNTER — Ambulatory Visit: Payer: BLUE CROSS/BLUE SHIELD | Admitting: General Surgery

## 2015-08-21 ENCOUNTER — Inpatient Hospital Stay: Payer: BLUE CROSS/BLUE SHIELD

## 2015-08-21 VITALS — BP 107/62 | HR 84 | Resp 20

## 2015-08-21 DIAGNOSIS — D571 Sickle-cell disease without crisis: Secondary | ICD-10-CM | POA: Diagnosis not present

## 2015-08-21 DIAGNOSIS — D57219 Sickle-cell/Hb-C disease with crisis, unspecified: Secondary | ICD-10-CM

## 2015-08-21 DIAGNOSIS — D631 Anemia in chronic kidney disease: Secondary | ICD-10-CM

## 2015-08-21 DIAGNOSIS — D508 Other iron deficiency anemias: Secondary | ICD-10-CM

## 2015-08-21 DIAGNOSIS — N183 Chronic kidney disease, stage 3 unspecified: Secondary | ICD-10-CM

## 2015-08-21 DIAGNOSIS — N189 Chronic kidney disease, unspecified: Principal | ICD-10-CM

## 2015-08-21 DIAGNOSIS — D6489 Other specified anemias: Secondary | ICD-10-CM

## 2015-08-21 LAB — CBC WITH DIFFERENTIAL/PLATELET
Basophils Absolute: 0.2 10*3/uL — ABNORMAL HIGH (ref 0–0.1)
Basophils Relative: 2 %
EOS ABS: 0.5 10*3/uL (ref 0–0.7)
Eosinophils Relative: 5 %
HEMATOCRIT: 16.4 % — AB (ref 35.0–47.0)
Hemoglobin: 5.4 g/dL — ABNORMAL LOW (ref 12.0–16.0)
LYMPHS ABS: 1.8 10*3/uL (ref 1.0–3.6)
MCH: 28.6 pg (ref 26.0–34.0)
MCHC: 33.1 g/dL (ref 32.0–36.0)
MCV: 86.4 fL (ref 80.0–100.0)
Monocytes Absolute: 1.4 10*3/uL — ABNORMAL HIGH (ref 0.2–0.9)
Monocytes Relative: 14 %
Neutro Abs: 5.9 10*3/uL (ref 1.4–6.5)
PLATELETS: 285 10*3/uL (ref 150–440)
RBC: 1.89 MIL/uL — AB (ref 3.80–5.20)
RDW: 21.8 % — ABNORMAL HIGH (ref 11.5–14.5)
WBC: 9.8 10*3/uL (ref 3.6–11.0)

## 2015-08-21 LAB — COMPREHENSIVE METABOLIC PANEL
ALBUMIN: 2.8 g/dL — AB (ref 3.5–5.0)
ALT: 32 U/L (ref 14–54)
AST: 48 U/L — AB (ref 15–41)
Alkaline Phosphatase: 152 U/L — ABNORMAL HIGH (ref 38–126)
Anion gap: 9 (ref 5–15)
BUN: 51 mg/dL — AB (ref 6–20)
CHLORIDE: 103 mmol/L (ref 101–111)
CO2: 25 mmol/L (ref 22–32)
CREATININE: 2.97 mg/dL — AB (ref 0.44–1.00)
Calcium: 8.2 mg/dL — ABNORMAL LOW (ref 8.9–10.3)
GFR calc Af Amer: 18 mL/min — ABNORMAL LOW (ref >60)
GFR, EST NON AFRICAN AMERICAN: 16 mL/min — AB (ref >60)
GLUCOSE: 113 mg/dL — AB (ref 65–99)
Potassium: 3.3 mmol/L — ABNORMAL LOW (ref 3.5–5.1)
Sodium: 137 mmol/L (ref 135–145)
Total Bilirubin: 4.6 mg/dL — ABNORMAL HIGH (ref 0.3–1.2)
Total Protein: 5.7 g/dL — ABNORMAL LOW (ref 6.5–8.1)

## 2015-08-21 LAB — SAMPLE TO BLOOD BANK

## 2015-08-21 LAB — VITAMIN B12: Vitamin B-12: 306 pg/mL (ref 180–914)

## 2015-08-21 LAB — RETICULOCYTES
RBC.: 1.89 MIL/uL — ABNORMAL LOW (ref 3.80–5.20)
Retic Count, Absolute: 270.3 10*3/uL — ABNORMAL HIGH (ref 19.0–183.0)
Retic Ct Pct: 14.3 % — ABNORMAL HIGH (ref 0.4–3.1)

## 2015-08-21 LAB — LACTATE DEHYDROGENASE: LDH: 244 U/L — ABNORMAL HIGH (ref 98–192)

## 2015-08-21 LAB — FOLATE: Folate: 80.3 ng/mL (ref >5.9)

## 2015-08-21 LAB — PREPARE RBC (CROSSMATCH)

## 2015-08-21 MED ORDER — EPOETIN ALFA 40000 UNIT/ML IJ SOLN
40000.0000 [IU] | Freq: Once | INTRAMUSCULAR | Status: AC
  Administered 2015-08-21: 40000 [IU] via SUBCUTANEOUS
  Filled 2015-08-21: qty 1

## 2015-08-21 NOTE — Progress Notes (Signed)
Type/screen and prepare rbc released. Notified blood bank that 2 units will be transfused tomorrow.

## 2015-08-22 ENCOUNTER — Inpatient Hospital Stay: Payer: BLUE CROSS/BLUE SHIELD

## 2015-08-22 VITALS — BP 122/65 | HR 88 | Temp 98.6°F | Resp 20

## 2015-08-22 DIAGNOSIS — D571 Sickle-cell disease without crisis: Secondary | ICD-10-CM | POA: Diagnosis not present

## 2015-08-22 DIAGNOSIS — D508 Other iron deficiency anemias: Secondary | ICD-10-CM

## 2015-08-22 MED ORDER — SODIUM CHLORIDE 0.9 % IV SOLN
250.0000 mL | Freq: Once | INTRAVENOUS | Status: AC
  Administered 2015-08-22: 250 mL via INTRAVENOUS
  Filled 2015-08-22: qty 250

## 2015-08-22 MED ORDER — ACETAMINOPHEN 325 MG PO TABS
650.0000 mg | ORAL_TABLET | Freq: Once | ORAL | Status: AC
  Administered 2015-08-22: 650 mg via ORAL
  Filled 2015-08-22: qty 2

## 2015-08-22 MED ORDER — DIPHENHYDRAMINE HCL 25 MG PO CAPS
25.0000 mg | ORAL_CAPSULE | Freq: Once | ORAL | Status: AC
  Administered 2015-08-22: 25 mg via ORAL
  Filled 2015-08-22: qty 1

## 2015-08-22 MED ORDER — FUROSEMIDE 10 MG/ML IJ SOLN
20.0000 mg | Freq: Once | INTRAMUSCULAR | Status: AC
  Administered 2015-08-22: 20 mg via INTRAVENOUS
  Filled 2015-08-22: qty 2

## 2015-08-25 ENCOUNTER — Encounter: Payer: Self-pay | Admitting: Internal Medicine

## 2015-08-26 LAB — HEMOGLOBINOPATHY EVALUATION
HGB A: 31.6 % — AB (ref 94.0–98.0)
HGB C: 0 %
Hgb A2 Quant: 3.9 % — ABNORMAL HIGH (ref 0.7–3.1)
Hgb F Quant: 0.7 % (ref 0.0–2.0)
Hgb S Quant: 63.8 % — ABNORMAL HIGH

## 2015-08-27 ENCOUNTER — Ambulatory Visit (INDEPENDENT_AMBULATORY_CARE_PROVIDER_SITE_OTHER): Payer: BLUE CROSS/BLUE SHIELD | Admitting: Internal Medicine

## 2015-08-27 ENCOUNTER — Encounter: Payer: Self-pay | Admitting: Internal Medicine

## 2015-08-27 ENCOUNTER — Telehealth: Payer: Self-pay | Admitting: Internal Medicine

## 2015-08-27 VITALS — BP 114/69 | HR 87 | Temp 98.0°F | Ht 65.0 in | Wt 197.0 lb

## 2015-08-27 DIAGNOSIS — I482 Chronic atrial fibrillation, unspecified: Secondary | ICD-10-CM

## 2015-08-27 DIAGNOSIS — D571 Sickle-cell disease without crisis: Secondary | ICD-10-CM

## 2015-08-27 DIAGNOSIS — N183 Chronic kidney disease, stage 3 unspecified: Secondary | ICD-10-CM

## 2015-08-27 NOTE — Progress Notes (Signed)
Subjective:    Patient ID: Jane Short, female    DOB: [birth year], 65 y.o.   MRN: [id number]  HPI  65YO female presents for follow up.  Recently seen in hospital follow up. Currently followed by oncology and had PRBC transfusion. Recent Hgb 5.4 prior to transfusion.  Feels that "fluid is back up again." Has appointment with Dr. Rolly Salter Monday. Taking Torsemide for 7 days at direction of Dr. Clayborn Bigness, then will change back to Furosemide. Swelling in legs has improved at night. Does not feel short of breath. Started on Amiodarone for AFIB. Decision made to hold off on anticoagulation given severe anemia.   Wt Readings from Last 3 Encounters:  08/27/15 197 lb (89.359 kg)  08/14/15 186 lb 8.2 oz (84.6 kg)  08/14/15 189 lb 9.6 oz (86.002 kg)   BP Readings from Last 3 Encounters:  08/27/15 114/69  08/22/15 122/65  08/21/15 107/62    Past Medical History  Diagnosis Date  . Sickle cell anemia (HCC)     sickle cell disease  . Vitamin D deficiency   . Hypertension   . Osteoporosis 2011    osteopenia  . 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 Cheree Ditto at [facility]  . Gout   . Sickle cell anemia (HCC)   . Lichen planus   . 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 COPD (chronic obstructive pulmonary disease) (HCC)     symptoms Dr. Patricia Pesa   . CHF (congestive heart failure) ([place])   . Congestive heart failure (CHF) ([place])   . Congestive heart failure (CHF) ([place]) 06/2015   Family History  Problem Relation Age of Onset  . Heart disease Father   . Diabetes Father   . Diabetes Sister   . Cancer Mother     breast cancer  . Cancer Maternal Aunt     Breast cancer   Past Surgical History  Procedure Laterality Date  . Tubal ligation    . Gallbladder surgery    . Colonoscopy  2014    [facility]  . Peripheral vascular catheterization Left 06/14/2015    Procedure: Dialysis/Perma Catheter Insertion;  Surgeon: Katha Cabal, MD;  Location: [facility];   Service: Cardiovascular;  Laterality: Left;   Social History   Social History  . Marital Status: Married    Spouse Name: N/A  . Number of Children: 1  . Years of Education: N/A   Occupational History  .      textiles   Social History Main Topics  . Smoking status: Former Smoker -- 0.25 packs/day for 30 years    Types: Cigarettes    Quit date: 04/29/2015  . Smokeless tobacco: Never Used     Comment: quit 1 month ago  . Alcohol Use: No  . Drug Use: No  . Sexual Activity: Not Asked   Other Topics Concern  . None   Social History Narrative   Regular Exercise -  NO   Daily Caffeine Use:  1 cup coffee   Lives with her sister.   Working as a Building control surveyor in [place]  Constitutional: Positive for fatigue. Negative for fever, chills, appetite change and unexpected weight change.  Eyes: Negative for visual disturbance.  Respiratory: Negative for cough and shortness of breath.   Cardiovascular: Positive for leg swelling. Negative for chest pain and palpitations.  Gastrointestinal: Negative for nausea, vomiting, abdominal pain, diarrhea  and constipation.  Musculoskeletal: Positive for myalgias, back pain and arthralgias.  Skin: Negative for color change and rash.  Hematological: Negative for adenopathy. Does not bruise/bleed easily.  Psychiatric/Behavioral: Negative for sleep disturbance and dysphoric mood. The patient is not nervous/anxious.        Objective:    BP 114/69 mmHg  Pulse 87  Temp(Src) 98 F (36.7 C) (Oral)  Ht 5\' 5"  (1.651 m)  Wt 197 lb (89.359 kg)  BMI 32.78 kg/m2  SpO2 96% Physical Exam  Constitutional: She is oriented to person, place, and time. She appears well-developed and well-nourished. No distress.  HENT:  Head: Normocephalic and atraumatic.  Right Ear: External ear normal.  Left Ear: External ear normal.  Nose: Nose normal.  Mouth/Throat: Oropharynx is clear and moist. No oropharyngeal exudate.    Eyes: Conjunctivae are normal. Pupils are equal, round, and reactive to light. Right eye exhibits no discharge. Left eye exhibits no discharge. No scleral icterus.  Neck: Normal range of motion. Neck supple. No tracheal deviation present. No thyromegaly present.  Cardiovascular: Normal rate, regular rhythm, normal heart sounds and intact distal pulses.   Occasional extrasystoles are present. Exam reveals no gallop and no friction rub.   No murmur heard. Pulmonary/Chest: Effort normal and breath sounds normal. No respiratory distress. She has no wheezes. She has no rales. She exhibits no tenderness.  Musculoskeletal: Normal range of motion. She exhibits edema (pitting bilateral LE to knee). She exhibits no tenderness.  Lymphadenopathy:    She has no cervical adenopathy.  Neurological: She is alert and oriented to person, place, and time. No cranial nerve deficit. She exhibits normal muscle tone. Coordination normal.  Skin: Skin is warm and dry. No rash noted. She is not diaphoretic. No erythema. No pallor.  Psychiatric: She has a normal mood and affect. Her behavior is normal. Judgment and thought content normal.          Assessment & Plan:   Problem List Items Addressed This Visit      Unprioritized   Atrial fibrillation ([place])    Rate and rhythm controlled on Amiodarone. Few PVCs noted today. Reviewed notes from Dr. Rolly Salter. Decision made not to start anticoagulation given severe anemia. Will follow.      Relevant Medications   torsemide (DEMADEX) 20 MG tablet   Chronic kidney disease (CKD), stage III (moderate) (Chronic)    Recent Cr near 2-3. Has follow up with Dr. Rolly Salter Monday. Message sent today. Question if she might need UF given worsening hypervolemia despite Torsemide.      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 Reviewed notes from hematology. Undergoing transfusions and discussion of iron infusion.          Return in  about 3 months (around 11/27/2015) for Recheck.  Ronette Deter, MD Internal Medicine [facility]

## 2015-08-27 NOTE — Telephone Encounter (Signed)
Pt wanted to let you know that Dr. Janae Bridgeman will be putting  her in the hospital to try an IV for 24 if this doesn't work she will go on permanent dialysis.. Please advise pt if you have any questions.Marland Kitchen

## 2015-08-27 NOTE — Assessment & Plan Note (Signed)
Recent HE confirmed HgbSS. Reviewed notes from hematology. Undergoing transfusions and discussion of iron infusion.

## 2015-08-27 NOTE — Progress Notes (Signed)
Pre visit review using our clinic review tool, if applicable. No additional management support is needed unless otherwise documented below in the visit note. 

## 2015-08-27 NOTE — Assessment & Plan Note (Signed)
Rate and rhythm controlled on Amiodarone. Few PVCs noted today. Reviewed notes from Dr. Rolly Salter. Decision made not to start anticoagulation given severe anemia. Will follow.

## 2015-08-27 NOTE — Assessment & Plan Note (Signed)
Recent Cr near 2-3. Has follow up with Dr. Rolly Salter Monday. Message sent today. Question if she might need UF given worsening hypervolemia despite Torsemide.

## 2015-08-27 NOTE — Patient Instructions (Signed)
Continue current medication.  Follow up in 3 months or sooner as needed.

## 2015-08-28 ENCOUNTER — Inpatient Hospital Stay: Payer: BLUE CROSS/BLUE SHIELD

## 2015-08-28 ENCOUNTER — Inpatient Hospital Stay
Admission: AD | Admit: 2015-08-28 | Discharge: 2015-09-01 | DRG: 683 | Disposition: A | Discharge status: Home / Self Care | Payer: BLUE CROSS/BLUE SHIELD | Source: Ambulatory Visit | Attending: Internal Medicine | Admitting: Internal Medicine

## 2015-08-28 ENCOUNTER — Encounter: Payer: Self-pay | Admitting: *Deleted

## 2015-08-28 DIAGNOSIS — I129 Hypertensive chronic kidney disease with stage 1 through stage 4 chronic kidney disease, or unspecified chronic kidney disease: Secondary | ICD-10-CM | POA: Diagnosis not present

## 2015-08-28 DIAGNOSIS — N184 Chronic kidney disease, stage 4 (severe): Secondary | ICD-10-CM | POA: Diagnosis present

## 2015-08-28 DIAGNOSIS — D631 Anemia in chronic kidney disease: Secondary | ICD-10-CM | POA: Diagnosis not present

## 2015-08-28 DIAGNOSIS — I959 Hypotension, unspecified: Secondary | ICD-10-CM | POA: Diagnosis present

## 2015-08-28 DIAGNOSIS — M810 Age-related osteoporosis without current pathological fracture: Secondary | ICD-10-CM | POA: Diagnosis present

## 2015-08-28 DIAGNOSIS — E875 Hyperkalemia: Secondary | ICD-10-CM | POA: Diagnosis present

## 2015-08-28 DIAGNOSIS — Z803 Family history of malignant neoplasm of breast: Secondary | ICD-10-CM

## 2015-08-28 DIAGNOSIS — Z8249 Family history of ischemic heart disease and other diseases of the circulatory system: Secondary | ICD-10-CM

## 2015-08-28 DIAGNOSIS — N189 Chronic kidney disease, unspecified: Secondary | ICD-10-CM | POA: Diagnosis not present

## 2015-08-28 DIAGNOSIS — I509 Heart failure, unspecified: Secondary | ICD-10-CM | POA: Diagnosis present

## 2015-08-28 DIAGNOSIS — E872 Acidosis: Secondary | ICD-10-CM | POA: Diagnosis present

## 2015-08-28 DIAGNOSIS — D571 Sickle-cell disease without crisis: Secondary | ICD-10-CM | POA: Diagnosis present

## 2015-08-28 DIAGNOSIS — Z79899 Other long term (current) drug therapy: Secondary | ICD-10-CM | POA: Diagnosis not present

## 2015-08-28 DIAGNOSIS — Z833 Family history of diabetes mellitus: Secondary | ICD-10-CM | POA: Diagnosis not present

## 2015-08-28 DIAGNOSIS — J449 Chronic obstructive pulmonary disease, unspecified: Secondary | ICD-10-CM | POA: Diagnosis present

## 2015-08-28 DIAGNOSIS — Z87891 Personal history of nicotine dependence: Secondary | ICD-10-CM | POA: Diagnosis not present

## 2015-08-28 DIAGNOSIS — N2581 Secondary hyperparathyroidism of renal origin: Secondary | ICD-10-CM | POA: Diagnosis present

## 2015-08-28 DIAGNOSIS — N179 Acute kidney failure, unspecified: Principal | ICD-10-CM | POA: Diagnosis present

## 2015-08-28 DIAGNOSIS — I480 Paroxysmal atrial fibrillation: Secondary | ICD-10-CM | POA: Diagnosis present

## 2015-08-28 DIAGNOSIS — D151 Benign neoplasm of heart: Secondary | ICD-10-CM | POA: Diagnosis present

## 2015-08-28 LAB — PREPARE RBC (CROSSMATCH)

## 2015-08-28 LAB — BASIC METABOLIC PANEL
ANION GAP: 8 (ref 5–15)
BUN: 60 mg/dL — AB (ref 6–20)
CALCIUM: 8 mg/dL — AB (ref 8.9–10.3)
CO2: 24 mmol/L (ref 22–32)
CREATININE: 2.78 mg/dL — AB (ref 0.44–1.00)
Chloride: 104 mmol/L (ref 101–111)
GFR calc Af Amer: 20 mL/min — ABNORMAL LOW (ref >60)
GFR, EST NON AFRICAN AMERICAN: 17 mL/min — AB (ref >60)
GLUCOSE: 95 mg/dL (ref 65–99)
Potassium: 3.8 mmol/L (ref 3.5–5.1)
Sodium: 136 mmol/L (ref 135–145)

## 2015-08-28 LAB — CBC
HEMATOCRIT: 17.1 % — AB (ref 35.0–47.0)
Hemoglobin: 5.5 g/dL — ABNORMAL LOW (ref 12.0–16.0)
MCH: 27.4 pg (ref 26.0–34.0)
MCHC: 32.3 g/dL (ref 32.0–36.0)
MCV: 84.8 fL (ref 80.0–100.0)
PLATELETS: 279 10*3/uL (ref 150–440)
RBC: 2.02 MIL/uL — ABNORMAL LOW (ref 3.80–5.20)
RDW: 19.9 % — AB (ref 11.5–14.5)
WBC: 9.7 10*3/uL (ref 3.6–11.0)

## 2015-08-28 LAB — HEMOGLOBIN AND HEMATOCRIT, BLOOD
HCT: 16.5 % — ABNORMAL LOW (ref 35.0–47.0)
HCT: 19.7 % — ABNORMAL LOW (ref 35.0–47.0)
HEMOGLOBIN: 5.4 g/dL — AB (ref 12.0–16.0)
HEMOGLOBIN: 6.5 g/dL — AB (ref 12.0–16.0)

## 2015-08-28 LAB — BRAIN NATRIURETIC PEPTIDE: B Natriuretic Peptide: 697 pg/mL — ABNORMAL HIGH (ref 0.0–100.0)

## 2015-08-28 MED ORDER — ENOXAPARIN SODIUM 40 MG/0.4ML ~~LOC~~ SOLN: 40.0000 mg | SUBCUTANEOUS | Status: DC

## 2015-08-28 MED ORDER — ONDANSETRON HCL 4 MG PO TABS: 4.0000 mg | ORAL_TABLET | Freq: Four times a day (QID) | ORAL | Status: DC | PRN

## 2015-08-28 MED ORDER — ZOLPIDEM TARTRATE 5 MG PO TABS: 5.0000 mg | ORAL_TABLET | Freq: Every evening | ORAL | Status: DC | PRN

## 2015-08-28 MED ORDER — ALBUTEROL SULFATE HFA 108 (90 BASE) MCG/ACT IN AERS: 2.0000 | INHALATION_SPRAY | RESPIRATORY_TRACT | Status: DC | PRN

## 2015-08-28 MED ORDER — MOMETASONE FURO-FORMOTEROL FUM 200-5 MCG/ACT IN AERO
2.0000 | INHALATION_SPRAY | Freq: Two times a day (BID) | RESPIRATORY_TRACT | Status: DC
  Administered 2015-08-28 – 2015-09-01 (×8): 2 via RESPIRATORY_TRACT
  Filled 2015-08-28: qty 8.8

## 2015-08-28 MED ORDER — HYDROCODONE-ACETAMINOPHEN 5-325 MG PO TABS
1.0000 | ORAL_TABLET | ORAL | Status: DC | PRN
  Administered 2015-08-29 – 2015-08-30 (×3): 1 via ORAL
  Administered 2015-08-31 (×2): 2 via ORAL
  Filled 2015-08-28 (×2): qty 2
  Filled 2015-08-28 (×3): qty 1

## 2015-08-28 MED ORDER — TRAZODONE HCL 50 MG PO TABS: 25.0000 mg | ORAL_TABLET | Freq: Every evening | ORAL | Status: DC | PRN

## 2015-08-28 MED ORDER — BISACODYL 5 MG PO TBEC: 5.0000 mg | DELAYED_RELEASE_TABLET | Freq: Every day | ORAL | Status: DC | PRN

## 2015-08-28 MED ORDER — ONDANSETRON HCL 4 MG/2ML IJ SOLN: 4.0000 mg | Freq: Four times a day (QID) | INTRAMUSCULAR | Status: DC | PRN

## 2015-08-28 MED ORDER — AMIODARONE HCL 200 MG PO TABS
200.0000 mg | ORAL_TABLET | Freq: Every day | ORAL | Status: DC
  Administered 2015-08-29 – 2015-09-01 (×4): 200 mg via ORAL
  Filled 2015-08-28 (×4): qty 1

## 2015-08-28 MED ORDER — ALBUTEROL SULFATE (2.5 MG/3ML) 0.083% IN NEBU: 2.5000 mg | INHALATION_SOLUTION | RESPIRATORY_TRACT | Status: DC | PRN

## 2015-08-28 MED ORDER — CALCITRIOL 0.25 MCG PO CAPS
0.2500 ug | ORAL_CAPSULE | Freq: Every day | ORAL | Status: DC
  Administered 2015-08-29 – 2015-09-01 (×4): 0.25 ug via ORAL
  Filled 2015-08-28 (×4): qty 1

## 2015-08-28 MED ORDER — ACETAMINOPHEN 650 MG RE SUPP: 650.0000 mg | Freq: Four times a day (QID) | RECTAL | Status: DC | PRN

## 2015-08-28 MED ORDER — SODIUM BICARBONATE 650 MG PO TABS
650.0000 mg | ORAL_TABLET | Freq: Every day | ORAL | Status: DC
  Administered 2015-08-29 – 2015-09-01 (×4): 650 mg via ORAL
  Filled 2015-08-28 (×4): qty 1

## 2015-08-28 MED ORDER — SODIUM CHLORIDE 0.9 % IV SOLN
Freq: Once | INTRAVENOUS | Status: AC
  Administered 2015-08-28: 18:00:00 via INTRAVENOUS

## 2015-08-28 MED ORDER — FOLIC ACID 1 MG PO TABS
1.0000 mg | ORAL_TABLET | Freq: Every day | ORAL | Status: DC
  Administered 2015-08-29 – 2015-09-01 (×4): 1 mg via ORAL
  Filled 2015-08-28 (×4): qty 1

## 2015-08-28 MED ORDER — ACETAMINOPHEN 325 MG PO TABS
650.0000 mg | ORAL_TABLET | Freq: Four times a day (QID) | ORAL | Status: DC | PRN
  Administered 2015-08-29: 650 mg via ORAL
  Filled 2015-08-28: qty 2

## 2015-08-28 MED ORDER — FUROSEMIDE 10 MG/ML IJ SOLN
10.0000 mg/h | INTRAMUSCULAR | Status: AC
  Administered 2015-08-28: 10 mg/h via INTRAVENOUS
  Filled 2015-08-28: qty 25

## 2015-08-28 MED ORDER — ENOXAPARIN SODIUM 30 MG/0.3ML ~~LOC~~ SOLN
30.0000 mg | SUBCUTANEOUS | Status: DC
  Administered 2015-08-28 – 2015-08-31 (×4): 30 mg via SUBCUTANEOUS
  Filled 2015-08-28 (×4): qty 0.3

## 2015-08-28 MED ORDER — DOCUSATE SODIUM 100 MG PO CAPS
100.0000 mg | ORAL_CAPSULE | Freq: Two times a day (BID) | ORAL | Status: DC
  Administered 2015-08-29: 100 mg via ORAL
  Filled 2015-08-28 (×5): qty 1

## 2015-08-28 MED ORDER — FUROSEMIDE 10 MG/ML IJ SOLN: 80.0000 mg | Freq: Two times a day (BID) | INTRAMUSCULAR | Status: DC

## 2015-08-28 NOTE — H&P (Signed)
[facility] NAME: Jane Short    MR#:  [id number]  DATE OF BIRTH:  28-Aug-1950  DATE OF ADMISSION:  08/28/2015  PRIMARY CARE PHYSICIAN: [name], MD   REQUESTING/REFERRING PHYSICIAN: Dr. Juleen China CHIEF COMPLAINT: weight gain, pedal edema  No chief complaint on file.   HISTORY OF PRESENT ILLNESS:  Jane Short  is a 65 y.o. female with a known history of present illness and anemia, hypertension, atrial myxoma, chronic renal failure and from Dr.Kolluru 's office because of worsening pedal edema, weight gain. Patient will be on IV Lasix drip to see if a improves. Otherwise patient will need to be on dialysis. Patient is shortness of breath or chest pain. She was on torsemide 7 days as ordered by the cardiology but now she is on Lasix 80 mg by mouth on the morning and 40 mg at night.  PAST MEDICAL HISTORY:   Past Medical History  Diagnosis Date  . Sickle cell anemia (HCC)     sickle cell disease  . Vitamin D deficiency   . Hypertension   . Osteoporosis 2011    osteopenia  . 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 Cheree [facility] at [facility]  . Gout   . Sickle cell anemia (HCC)   . Lichen planus   . SCA-1 (spinocerebellar ataxia type 1) ([place])   . COPD (chronic obstructive pulmonary disease) (HCC)     symptoms Dr. Patricia Pesa   . CHF (congestive heart failure) ([place])   . Congestive heart failure (CHF) ([place])   . Congestive heart failure (CHF) ([place]) 06/2015    PAST SURGICAL HISTOIRY:   Past Surgical History  Procedure Laterality Date  . Tubal ligation    . Gallbladder surgery    . Colonoscopy  2014    [facility]  . Peripheral vascular catheterization Left 06/14/2015    Procedure: Dialysis/Perma Catheter Insertion;  Surgeon: Katha Cabal, MD;  Location: [facility];  Service: Cardiovascular;  Laterality: Left;    SOCIAL HISTORY:   Social History  Substance Use Topics  . Smoking status: Former Smoker  -- 0.25 packs/day for 30 years    Types: Cigarettes    Quit date: 04/29/2015  . Smokeless tobacco: Never Used     Comment: quit 1 month ago  . Alcohol Use: No    FAMILY HISTORY:   Family History  Problem Relation Age of Onset  . Heart disease Father   . Diabetes Father   . Diabetes Sister   . Cancer Mother     breast cancer  . Cancer Maternal Aunt     Breast cancer    DRUG ALLERGIES:   Allergies  Allergen Reactions  . Ramipril     cough    REVIEW OF SYSTEMS:  CONSTITUTIONAL: No fever, fatigue or weakness.  EYES: No blurred or double vision.  EARS, NOSE, AND THROAT: No tinnitus or ear pain.  RESPIRATORY: No cough or shortness of breath.  CARDIOVASCULAR: No chest pain, orthopnea,. Has pedal edema.  GASTROINTESTINAL: No nausea, vomiting, diarrhea or abdominal pain.  GENITOURINARY: No dysuria, hematuria.  ENDOCRINE: No polyuria, nocturia,  HEMATOLOGY: No anemia, easy bruising or bleeding SKIN: No rash or lesion. MUSCULOSKELETAL: No joint pain or arthritis.   NEUROLOGIC: No tingling, numbness, weakness.  PSYCHIATRY: No anxiety or depression.   MEDICATIONS AT HOME:   Prior to Admission medications   Medication Sig Start Date End Date Taking? Authorizing Provider  albuterol (PROVENTIL  HFA;VENTOLIN HFA) 108 (90 Base) MCG/ACT inhaler Inhale 2 puffs into the lungs every 4 (four) hours as needed for wheezing or shortness of breath. 08/14/15   Flora Lipps, MD  amiodarone (PACERONE) 200 MG tablet Take 200 mg by mouth daily. 07/21/15 08/20/15  Historical Provider, MD  calcitRIOL (ROCALTROL) 0.25 MCG capsule Take 0.25 mcg by mouth daily.    Historical Provider, MD  folic acid (FOLVITE) 1 MG tablet Take 1 mg by mouth daily.    Historical Provider, MD  furosemide (LASIX) 80 MG tablet Take 80 mg by mouth daily. Reported on 08/27/2015 07/18/15   Historical Provider, MD  mometasone-formoterol (DULERA) 200-5 MCG/ACT AERO Inhale 2 puffs into the lungs 2 (two) times daily. 01/29/15   Flora Lipps, MD  sodium bicarbonate 650 MG tablet Take by mouth.    Historical Provider, MD      VITAL SIGNS:  Blood pressure 117/59, pulse 89, temperature 98.7 F (37.1 C), temperature source Oral, resp. rate 18, SpO2 99 %.  PHYSICAL EXAMINATION:  GENERAL:  65 y.o.-year-old patient lying in the bed with no acute distress.  EYES: Pupils equal, round, reactive to light and accommodation. No scleral icterus. Extraocular muscles intact.  HEENT: Head atraumatic, normocephalic. Oropharynx and nasopharynx clear.  NECK:  Supple, no jugular venous distention. No thyroid enlargement, no tenderness.  LUNGS: Normal breath sounds bilaterally, no wheezing, rales,rhonchi or crepitation. No use of accessory muscles of respiration.  CARDIOVASCULAR: S1, S2 normal. No murmurs, rubs, or gallops.  ABDOMEN: Soft, nontender, nondistended. Bowel sounds present. No organomegaly or mass.  EXTREMITIES:Plus pitting pedal edema bilaterally up to knees Neurologic : Cranial nerves II through XII are intact. Muscle strength 5/5 in all extremities. Sensation intact. Gait not checked.  PSYCHIATRIC: The patient is alert and oriented x 3.  SKIN: No obvious rash, lesion, or ulcer.   LABORATORY PANEL:   CBC  Recent Labs Lab 08/28/15 1216  WBC 9.7  HGB 5.5*  HCT 17.1*  PLT 279   ------------------------------------------------------------------------------------------------------------------  Chemistries   Recent Labs Lab 08/21/15 1355 08/28/15 1216  NA 137 136  K 3.3* 3.8  CL 103 104  CO2 25 24  GLUCOSE 113* 95  BUN 51* 60*  CREATININE 2.97* 2.78*  CALCIUM 8.2* 8.0*  AST 48*  --   ALT 32  --   ALKPHOS 152*  --   BILITOT 4.6*  --    ------------------------------------------------------------------------------------------------------------------  Cardiac Enzymes No results for input(s): TROPONINI in the last 168  hours. ------------------------------------------------------------------------------------------------------------------  RADIOLOGY:  No results found.  EKG:   Orders placed or performed during the hospital encounter of 06/05/15  . ED EKG  . ED EKG  . EKG 12-Lead  . EKG 12-Lead  . EKG    IMPRESSION AND PLAN:    Impression;  worsening renal failure with history of chronic kidney disease stage III: Patient has weight gain up to 10 pounds weight previously was 189 pounds,now it is 197 pounds refractory to by mouth Lasix needing IV Lasix drip at this time .so admitted to medical unit, nephrology consult, possible need for hemodialysis  If the Lasix does not help to decrease the fluid.   #2 acute and chronic anemia due to sickle cell anemia: Hemoglobin 5.5 daily: received  one unit packed RBC today. Received transfusion recently. #3 chronic atrial fibrillation: Rate controlled. Patient is on amiodarone. Not on anticoagulation because of severe anemia. #4. Atrial myxoma;Patient follows up with Dr. Clayborn Bigness; Stable.  5 COPD: No wheezing. All the records are reviewed  and case discussed with ED provider. Management plans discussed with the patient, family and they are in agreement.  CODE STATUS: full  TOTAL TIME TAKING CARE OF THIS PATIENT: 55 minutes.    Epifanio Lesches M.D on 08/28/2015 at 1:10 PM  Between 7am to 6pm - Pager - [phone]  After 6pm go to [email or web address] - password [email or web address] [facility]  Office  [phone]  CC: Primary care physician; [name], MD  Note: This dictation was prepared with Dragon dictation along with smaller phrase technology. Any transcriptional errors that result from this process are unintentional.

## 2015-08-28 NOTE — Telephone Encounter (Signed)
FYI

## 2015-08-28 NOTE — Progress Notes (Signed)
Lovenox dose adjusted to 30mg  q24 hr based on CrCl of 22.6 ml/min.

## 2015-08-28 NOTE — Progress Notes (Signed)
[facility]  ROUNDING NOTE   Subjective:   Admitted with failure for progressive peripheral edema to be resolved with PO diuretics.   Objective:  Vital signs in last 24 hours:  Temp:  [98.7 F (37.1 C)] 98.7 F (37.1 C) (03/29 1228) Pulse Rate:  [84-89] 84 (03/29 1554) Resp:  [18] 18 (03/29 1554) BP: (104-117)/(55-59) 104/55 mmHg (03/29 1554) SpO2:  [99 %-100 %] 100 % (03/29 1554)  Weight change:  There were no vitals filed for this visit.  Intake/Output:     Intake/Output this shift:     Physical Exam: General: NAD, sitting in chair  Head: Normocephalic, atraumatic. Moist oral mucosal membranes  Eyes: Anicteric, PERRL  Neck: Supple, trachea midline  Lungs:  Bilateral crackles  Heart: Regular rate and rhythm  Abdomen:  Soft, nontender,   Extremities: ++ peripheral edema.  Neurologic: Nonfocal, moving all four extremities  Skin: No lesions       Basic Metabolic Panel:  Recent Labs Lab 08/28/15 1216  NA 136  K 3.8  CL 104  CO2 24  GLUCOSE 95  BUN 60*  CREATININE 2.78*  CALCIUM 8.0*    Liver Function Tests: No results for input(s): AST, ALT, ALKPHOS, BILITOT, PROT, ALBUMIN in the last 168 hours. No results for input(s): LIPASE, AMYLASE in the last 168 hours. No results for input(s): AMMONIA in the last 168 hours.  CBC:  Recent Labs Lab 08/28/15 1216 08/28/15 1344  WBC 9.7  --   HGB 5.5* 5.4*  HCT 17.1* 16.5*  MCV 84.8  --   PLT 279  --     Cardiac Enzymes: No results for input(s): CKTOTAL, CKMB, CKMBINDEX, TROPONINI in the last 168 hours.  BNP: Invalid input(s): POCBNP  CBG: No results for input(s): GLUCAP in the last 168 hours.  Microbiology: Results for orders placed or performed during the hospital encounter of 06/05/15  Body fluid culture     Status: None   Collection Time: 06/18/15  2:35 PM  Result Value Ref Range Status   Specimen Description ABDOMEN  Final   Special Requests NONE  Final   Gram Stain FEW WBC  SEEN NO ORGANISMS SEEN   Final   Culture No growth aerobically or anaerobically.  Final   Report Status 06/22/2015 FINAL  Final  C difficile quick scan w PCR reflex     Status: None   Collection Time: 06/21/15  1:00 AM  Result Value Ref Range Status   C Diff antigen NEGATIVE NEGATIVE Final   C Diff toxin NEGATIVE NEGATIVE Final   C Diff interpretation Negative for C. difficile  Final  CULTURE, BLOOD (ROUTINE X 2) w Reflex to PCR ID Panel     Status: None   Collection Time: 06/22/15 12:10 PM  Result Value Ref Range Status   Specimen Description BLOOD LEFT ANTECUBITAL  Final   Special Requests BOTTLES DRAWN AEROBIC AND ANAEROBIC  3CC  Final   Culture  Setup Time   Final    GRAM POSITIVE COCCI IN BOTH AEROBIC AND ANAEROBIC BOTTLES CRITICAL RESULT CALLED TO, READ BACK BY AND VERIFIED WITH: NATE COOKSON AT [place] 06/23/15.PMH CONFIRMED BY [facility]    Culture   Final    ENTEROCOCCUS FAECALIS IN BOTH AEROBIC AND ANAEROBIC BOTTLES    Report Status 06/25/2015 FINAL  Final   Organism ID, Bacteria ENTEROCOCCUS FAECALIS  Final      Susceptibility   Enterococcus faecalis - MIC*    AMPICILLIN <=2 SENSITIVE Sensitive     LINEZOLID  2 SENSITIVE Sensitive     VANCOMYCIN Value in next row Sensitive      SENSITIVE1    GENTAMICIN SYNERGY Value in next row Sensitive      SENSITIVE1    * ENTEROCOCCUS FAECALIS  CULTURE, BLOOD (ROUTINE X 2) w Reflex to PCR ID Panel     Status: None   Collection Time: 06/22/15 12:21 PM  Result Value Ref Range Status   Specimen Description BLOOD RIGHT ARM  Final   Special Requests BOTTLES DRAWN AEROBIC AND ANAEROBIC  [place]  Final   Culture  Setup Time   Final    GRAM POSITIVE COCCI IN BOTH AEROBIC AND ANAEROBIC BOTTLES CRITICAL RESULT CALLED TO, READ BACK BY AND VERIFIED WITH: NATE COOKSON AT [place] 06/23/15.PMH CONFIRMED BY [facility]    Culture   Final    ENTEROCOCCUS FAECALIS IN BOTH AEROBIC AND ANAEROBIC BOTTLES    Report Status 06/25/2015 FINAL  Final   Organism ID,  Bacteria ENTEROCOCCUS FAECALIS  Final      Susceptibility   Enterococcus faecalis - MIC*    AMPICILLIN <=2 SENSITIVE Sensitive     LINEZOLID 2 SENSITIVE Sensitive     VANCOMYCIN Value in next row Sensitive      SENSITIVE1    GENTAMICIN SYNERGY Value in next row Sensitive      SENSITIVE1    * ENTEROCOCCUS FAECALIS  Blood Culture ID Panel (Reflexed)     Status: Abnormal   Collection Time: 06/22/15 12:21 PM  Result Value Ref Range Status   Enterococcus species DETECTED (A) NOT DETECTED Final    Comment: CRITICAL RESULT CALLED TO, READ BACK BY AND VERIFIED WITH: NATE COOKSON AT 0330 06/23/15.PMH    Listeria monocytogenes NOT DETECTED NOT DETECTED Final   Staphylococcus species NOT DETECTED NOT DETECTED Final   Staphylococcus aureus NOT DETECTED NOT DETECTED Final   Streptococcus species NOT DETECTED NOT DETECTED Final   Streptococcus agalactiae NOT DETECTED NOT DETECTED Final   Streptococcus pneumoniae NOT DETECTED NOT DETECTED Final   Streptococcus pyogenes NOT DETECTED NOT DETECTED Final   Acinetobacter baumannii NOT DETECTED NOT DETECTED Final   Enterobacteriaceae species NOT DETECTED NOT DETECTED Final   Enterobacter cloacae complex NOT DETECTED NOT DETECTED Final   Escherichia coli NOT DETECTED NOT DETECTED Final   Klebsiella oxytoca NOT DETECTED NOT DETECTED Final   Klebsiella pneumoniae NOT DETECTED NOT DETECTED Final   Proteus species NOT DETECTED NOT DETECTED Final   Serratia marcescens NOT DETECTED NOT DETECTED Final   Haemophilus influenzae NOT DETECTED NOT DETECTED Final   Neisseria meningitidis NOT DETECTED NOT DETECTED Final   Pseudomonas aeruginosa NOT DETECTED NOT DETECTED Final   Candida albicans NOT DETECTED NOT DETECTED Final   Candida glabrata NOT DETECTED NOT DETECTED Final   Candida krusei NOT DETECTED NOT DETECTED Final   Candida parapsilosis NOT DETECTED NOT DETECTED Final   Candida tropicalis NOT DETECTED NOT DETECTED Final   Carbapenem resistance NOT  DETECTED NOT DETECTED Final   Methicillin resistance NOT DETECTED NOT DETECTED Final   Vancomycin resistance NOT DETECTED NOT DETECTED Final    Coagulation Studies: No results for input(s): LABPROT, INR in the last 72 hours.  Urinalysis: No results for input(s): COLORURINE, LABSPEC, PHURINE, GLUCOSEU, HGBUR, BILIRUBINUR, KETONESUR, PROTEINUR, UROBILINOGEN, NITRITE, LEUKOCYTESUR in the last 72 hours.  Invalid input(s): APPERANCEUR    Imaging: No results found.   Medications:   . furosemide (LASIX) infusion 10 mg/hr (08/28/15 1548)   . sodium chloride   Intravenous Once  . amiodarone  200 mg Oral Daily  . calcitRIOL  0.25 mcg Oral Daily  . docusate sodium  100 mg Oral BID  . enoxaparin (LOVENOX) injection  30 mg Subcutaneous Q24H  . folic acid  1 mg Oral Daily  . mometasone-formoterol  2 puff Inhalation BID  . sodium bicarbonate  650 mg Oral Daily   acetaminophen **OR** acetaminophen, albuterol, bisacodyl, HYDROcodone-acetaminophen, ondansetron **OR** ondansetron (ZOFRAN) IV, zolpidem  Assessment/ Plan:   Jane Short is a 65 year old black female with sickle cell disease, atrial myxoma who presents 08/28/2015 for Acute on Chronic Renal Failure   1. Acute Renal Failure on Chronic Kidney Disease stage IV with hyperkalemia and metabolic acidosis:  Acute renal failure due to severe anemia, sepsis/endocarditis and hypotension. However concern for limited recovery and progression of disease.  Now has failed outpatient diuretic therapy. Start furosemide gtt. If no improvement, may need to initiate hemodialysis.  Chronic Kidney Disease is secondary to NSAID use and sickle cell anemia.  - Continue sodium bicarbonate for metabolic acidosis.  - Currently off ramipril - hold for acute renal failure, advanced renal disease and hyperkalemia   2. Sickle Cell Disease with anemia of chronic kidney disease: status recent transfusion.  - ordered 1 unit PRBC  3. Hypertension: low   4.  Secondary Hyperparathyroidism: PTH 197. Calcium and phosphorus at goal.  - Continue calcitriol   LOS: 0 Jane Short 3/29/20173:57 PM

## 2015-08-29 LAB — BASIC METABOLIC PANEL
ANION GAP: 8 (ref 5–15)
BUN: 56 mg/dL — ABNORMAL HIGH (ref 6–20)
CO2: 25 mmol/L (ref 22–32)
Calcium: 7.9 mg/dL — ABNORMAL LOW (ref 8.9–10.3)
Chloride: 103 mmol/L (ref 101–111)
Creatinine, Ser: 2.66 mg/dL — ABNORMAL HIGH (ref 0.44–1.00)
GFR calc Af Amer: 21 mL/min — ABNORMAL LOW (ref >60)
GFR, EST NON AFRICAN AMERICAN: 18 mL/min — AB (ref >60)
GLUCOSE: 127 mg/dL — AB (ref 65–99)
POTASSIUM: 3.8 mmol/L (ref 3.5–5.1)
Sodium: 136 mmol/L (ref 135–145)

## 2015-08-29 LAB — CBC
HEMATOCRIT: 18.6 % — AB (ref 35.0–47.0)
HEMOGLOBIN: 6.1 g/dL — AB (ref 12.0–16.0)
MCH: 27.6 pg (ref 26.0–34.0)
MCHC: 32.9 g/dL (ref 32.0–36.0)
MCV: 83.7 fL (ref 80.0–100.0)
Platelets: 261 10*3/uL (ref 150–440)
RBC: 2.22 MIL/uL — AB (ref 3.80–5.20)
RDW: 18.3 % — ABNORMAL HIGH (ref 11.5–14.5)
WBC: 9.4 10*3/uL (ref 3.6–11.0)

## 2015-08-29 LAB — HEMOGLOBIN AND HEMATOCRIT, BLOOD
HEMATOCRIT: 21.7 % — AB (ref 35.0–47.0)
HEMOGLOBIN: 7.2 g/dL — AB (ref 12.0–16.0)

## 2015-08-29 LAB — PREPARE RBC (CROSSMATCH)

## 2015-08-29 LAB — GLUCOSE, CAPILLARY: GLUCOSE-CAPILLARY: 95 mg/dL (ref 65–99)

## 2015-08-29 MED ORDER — SODIUM CHLORIDE 0.9 % IV SOLN: Freq: Once | INTRAVENOUS | Status: DC

## 2015-08-29 MED ORDER — FUROSEMIDE 10 MG/ML IJ SOLN
10.0000 mg/h | INTRAMUSCULAR | Status: AC
  Administered 2015-08-29 (×2): 10 mg/h via INTRAVENOUS
  Filled 2015-08-29: qty 25

## 2015-08-29 NOTE — Progress Notes (Signed)
[facility]  ROUNDING NOTE   Subjective:   Furosemide gtt 10mg /hr UOP 2100  Creatinine 2.66 (2.78)  Objective:  Vital signs in last 24 hours:  Temp:  [97.4 F (36.3 C)-98.6 F (37 C)] 98.3 F (36.8 C) (03/30 1103) Pulse Rate:  [74-91] 89 (03/30 1103) Resp:  [16-24] 24 (03/30 1103) BP: (102-120)/(49-61) 113/53 mmHg (03/30 1103) SpO2:  [94 %-100 %] 99 % (03/30 1103) Weight:  [89.404 kg (197 lb 1.6 oz)] 89.404 kg (197 lb 1.6 oz) (03/30 0500)  Weight change:  Filed Weights   08/29/15 0500  Weight: 89.404 kg (197 lb 1.6 oz)    Intake/Output: I/O last 3 completed shifts: In: 989.6 [P.O.:480; I.V.:119.6; Blood:390] Out: 2100 [Urine:2100]   Intake/Output this shift:  Total I/O In: 276.1 [P.O.:240; I.V.:36.1] Out: 850 [Urine:850]  Physical Exam: General: NAD, sitting in chair  Head: Normocephalic, atraumatic. Moist oral mucosal membranes  Eyes: Anicteric, PERRL  Neck: Supple, trachea midline  Lungs:  Bilateral crackles  Heart: Regular rate and rhythm  Abdomen:  Soft, nontender,   Extremities: + peripheral edema.  Neurologic: Nonfocal, moving all four extremities  Skin: No lesions       Basic Metabolic Panel:  Recent Labs Lab 08/28/15 1216 08/29/15 0420  NA 136 136  K 3.8 3.8  CL 104 103  CO2 24 25  GLUCOSE 95 127*  BUN 60* 56*  CREATININE 2.78* 2.66*  CALCIUM 8.0* 7.9*    Liver Function Tests: No results for input(s): AST, ALT, ALKPHOS, BILITOT, PROT, ALBUMIN in the last 168 hours. No results for input(s): LIPASE, AMYLASE in the last 168 hours. No results for input(s): AMMONIA in the last 168 hours.  CBC:  Recent Labs Lab 08/28/15 1216 08/28/15 1344 08/28/15 2312 08/29/15 0420  WBC 9.7  --   --  9.4  HGB 5.5* 5.4* 6.5* 6.1*  HCT 17.1* 16.5* 19.7* 18.6*  MCV 84.8  --   --  83.7  PLT 279  --   --  261    Cardiac Enzymes: No results for input(s): CKTOTAL, CKMB, CKMBINDEX, TROPONINI in the last 168 hours.  BNP: Invalid  input(s): POCBNP  CBG:  Recent Labs Lab 08/29/15 0805  GLUCAP 95    Microbiology: Results for orders placed or performed during the hospital encounter of 06/05/15  Body fluid culture     Status: None   Collection Time: 06/18/15  2:35 PM  Result Value Ref Range Status   Specimen Description ABDOMEN  Final   Special Requests NONE  Final   Gram Stain FEW WBC SEEN NO ORGANISMS SEEN   Final   Culture No growth aerobically or anaerobically.  Final   Report Status 06/22/2015 FINAL  Final  C difficile quick scan w PCR reflex     Status: None   Collection Time: 06/21/15  1:00 AM  Result Value Ref Range Status   C Diff antigen NEGATIVE NEGATIVE Final   C Diff toxin NEGATIVE NEGATIVE Final   C Diff interpretation Negative for C. difficile  Final  CULTURE, BLOOD (ROUTINE X 2) w Reflex to PCR ID Panel     Status: None   Collection Time: 06/22/15 12:10 PM  Result Value Ref Range Status   Specimen Description BLOOD LEFT ANTECUBITAL  Final   Special Requests BOTTLES DRAWN AEROBIC AND ANAEROBIC  3CC  Final   Culture  Setup Time   Final    GRAM POSITIVE COCCI IN BOTH AEROBIC AND ANAEROBIC BOTTLES CRITICAL RESULT CALLED TO, READ BACK BY  AND VERIFIED WITH: NATE COOKSON AT 0330 06/23/15.PMH CONFIRMED BY RWW    Culture   Final    ENTEROCOCCUS FAECALIS IN BOTH AEROBIC AND ANAEROBIC BOTTLES    Report Status 06/25/2015 FINAL  Final   Organism ID, Bacteria ENTEROCOCCUS FAECALIS  Final      Susceptibility   Enterococcus faecalis - MIC*    AMPICILLIN <=2 SENSITIVE Sensitive     LINEZOLID 2 SENSITIVE Sensitive     VANCOMYCIN Value in next row Sensitive      SENSITIVE1    GENTAMICIN SYNERGY Value in next row Sensitive      SENSITIVE1    * ENTEROCOCCUS FAECALIS  CULTURE, BLOOD (ROUTINE X 2) w Reflex to PCR ID Panel     Status: None   Collection Time: 06/22/15 12:21 PM  Result Value Ref Range Status   Specimen Description BLOOD RIGHT ARM  Final   Special Requests BOTTLES DRAWN AEROBIC AND  ANAEROBIC  [place]  Final   Culture  Setup Time   Final    GRAM POSITIVE COCCI IN BOTH AEROBIC AND ANAEROBIC BOTTLES CRITICAL RESULT CALLED TO, READ BACK BY AND VERIFIED WITH: NATE COOKSON AT [place] 06/23/15.PMH CONFIRMED BY RWW    Culture   Final    ENTEROCOCCUS FAECALIS IN BOTH AEROBIC AND ANAEROBIC BOTTLES    Report Status 06/25/2015 FINAL  Final   Organism ID, Bacteria ENTEROCOCCUS FAECALIS  Final      Susceptibility   Enterococcus faecalis - MIC*    AMPICILLIN <=2 SENSITIVE Sensitive     LINEZOLID 2 SENSITIVE Sensitive     VANCOMYCIN Value in next row Sensitive      SENSITIVE1    GENTAMICIN SYNERGY Value in next row Sensitive      SENSITIVE1    * ENTEROCOCCUS FAECALIS  Blood Culture ID Panel (Reflexed)     Status: Abnormal   Collection Time: 06/22/15 12:21 PM  Result Value Ref Range Status   Enterococcus species DETECTED (A) NOT DETECTED Final    Comment: CRITICAL RESULT CALLED TO, READ BACK BY AND VERIFIED WITH: NATE COOKSON AT 0330 06/23/15.PMH    Listeria monocytogenes NOT DETECTED NOT DETECTED Final   Staphylococcus species NOT DETECTED NOT DETECTED Final   Staphylococcus aureus NOT DETECTED NOT DETECTED Final   Streptococcus species NOT DETECTED NOT DETECTED Final   Streptococcus agalactiae NOT DETECTED NOT DETECTED Final   Streptococcus pneumoniae NOT DETECTED NOT DETECTED Final   Streptococcus pyogenes NOT DETECTED NOT DETECTED Final   Acinetobacter baumannii NOT DETECTED NOT DETECTED Final   Enterobacteriaceae species NOT DETECTED NOT DETECTED Final   Enterobacter cloacae complex NOT DETECTED NOT DETECTED Final   Escherichia coli NOT DETECTED NOT DETECTED Final   Klebsiella oxytoca NOT DETECTED NOT DETECTED Final   Klebsiella pneumoniae NOT DETECTED NOT DETECTED Final   Proteus species NOT DETECTED NOT DETECTED Final   Serratia marcescens NOT DETECTED NOT DETECTED Final   Haemophilus influenzae NOT DETECTED NOT DETECTED Final   Neisseria meningitidis NOT DETECTED NOT  DETECTED Final   Pseudomonas aeruginosa NOT DETECTED NOT DETECTED Final   Candida albicans NOT DETECTED NOT DETECTED Final   Candida glabrata NOT DETECTED NOT DETECTED Final   Candida krusei NOT DETECTED NOT DETECTED Final   Candida parapsilosis NOT DETECTED NOT DETECTED Final   Candida tropicalis NOT DETECTED NOT DETECTED Final   Carbapenem resistance NOT DETECTED NOT DETECTED Final   Methicillin resistance NOT DETECTED NOT DETECTED Final   Vancomycin resistance NOT DETECTED NOT DETECTED Final    Coagulation  Studies: No results for input(s): LABPROT, INR in the last 72 hours.  Urinalysis: No results for input(s): COLORURINE, LABSPEC, PHURINE, GLUCOSEU, HGBUR, BILIRUBINUR, KETONESUR, PROTEINUR, UROBILINOGEN, NITRITE, LEUKOCYTESUR in the last 72 hours.  Invalid input(s): APPERANCEUR    Imaging: No results found.   Medications:   . furosemide (LASIX) infusion 10 mg/hr (08/29/15 1050)   . sodium chloride   Intravenous Once  . amiodarone  200 mg Oral Daily  . calcitRIOL  0.25 mcg Oral Daily  . docusate sodium  100 mg Oral BID  . enoxaparin (LOVENOX) injection  30 mg Subcutaneous Q24H  . folic acid  1 mg Oral Daily  . mometasone-formoterol  2 puff Inhalation BID  . sodium bicarbonate  650 mg Oral Daily   acetaminophen **OR** acetaminophen, albuterol, bisacodyl, HYDROcodone-acetaminophen, ondansetron **OR** ondansetron (ZOFRAN) IV, zolpidem  Assessment/ Plan:   Ms. Guastella is a 65 year old black female with sickle cell disease, atrial myxoma who presents 08/28/2015 for Acute on Chronic Renal Failure   1. Acute Renal Failure on Chronic Kidney Disease stage IV with hyperkalemia and metabolic acidosis:  Acute renal failure due to severe anemia, acute congestive heart failure and hypotension. However concern for limited recovery and progression of disease.  Now has failed outpatient diuretic therapy. Started furosemide gtt.  Chronic Kidney Disease is secondary to NSAID use and  sickle cell anemia.  - Continue sodium bicarbonate for metabolic acidosis.  - Currently off ramipril - hold for acute renal failure, advanced renal disease and hyperkalemia   2. 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  - ordered 1 unit PRBC  3. Hypertension: low. Holding home agents  4. Secondary Hyperparathyroidism: PTH 197. Calcium and phosphorus at goal.  - Continue calcitriol   LOS: 1 Jane Short 3/30/201712:59 PM

## 2015-08-29 NOTE — Progress Notes (Signed)
[facility] [place] NAME: Jane Short    MR#:  [id number]  DATE OF BIRTH:  1950-12-21  SUBJECTIVE:  CHIEF COMPLAINT:  No chief complaint on file.  - Admitted from Nephrology office for anasarca - still has worse lower extremity edema, on lasix drip - known sickle cell and transfusion dependant anemia, received 1 unit yesterday and 2nd unit of PRBC today  REVIEW OF SYSTEMS:  Review of Systems  Constitutional: Positive for malaise/fatigue. Negative for fever and chills.  HENT: Negative for congestion, ear discharge and nosebleeds.   Eyes: Negative for blurred vision and double vision.  Respiratory: Negative for cough, shortness of breath and wheezing.   Cardiovascular: Positive for leg swelling. Negative for chest pain and palpitations.  Gastrointestinal: Negative for nausea, vomiting, abdominal pain, diarrhea and constipation.  Genitourinary: Negative for dysuria and urgency.  Musculoskeletal: Positive for back pain. Negative for myalgias.  Neurological: Negative for dizziness, sensory change, speech change, focal weakness, seizures and headaches.  Psychiatric/Behavioral: Negative for depression.    DRUG ALLERGIES:   Allergies  Allergen Reactions  . Ramipril     cough    VITALS:  Blood pressure 110/44, pulse 82, temperature 98.2 F (36.8 C), temperature source Oral, resp. rate 18, height 5\' 7"  (1.702 m), weight 89.404 kg (197 lb 1.6 oz), SpO2 99 %.  PHYSICAL EXAMINATION:  Physical Exam  GENERAL:  65 y.o.-year-old patient lying in the bed with no acute distress.  EYES: Pupils equal, round, reactive to light and accommodation. No scleral icterus. Extraocular muscles intact. Conjunctival pallor noted. HEENT: Head atraumatic, normocephalic. Oropharynx and nasopharynx clear.  NECK:  Supple, no jugular venous distention. No thyroid enlargement, no tenderness.  LUNGS: Normal breath sounds bilaterally, no wheezing,  rales,rhonchi or crepitation. No use of accessory muscles of respiration. Decreased bibasilar breath sounds. CARDIOVASCULAR: S1, S2 normal. No murmurs, rubs, or gallops.  ABDOMEN: Soft, nontender, nondistended. Bowel sounds present. No organomegaly or mass.  EXTREMITIES: 4+ edema in both legs upto thighs. No cyanosis, or clubbing.  NEUROLOGIC: Cranial nerves II through XII are intact. Muscle strength 5/5 in all extremities. Sensation intact. Gait not checked.  PSYCHIATRIC: The patient is alert and oriented x 3.  SKIN: No obvious rash, lesion, or ulcer.    LABORATORY PANEL:   CBC  Recent Labs Lab 08/29/15 0420  WBC 9.4  HGB 6.1*  HCT 18.6*  PLT 261   ------------------------------------------------------------------------------------------------------------------  Chemistries   Recent Labs Lab 08/29/15 0420  NA 136  K 3.8  CL 103  CO2 25  GLUCOSE 127*  BUN 56*  CREATININE 2.66*  CALCIUM 7.9*   ------------------------------------------------------------------------------------------------------------------  Cardiac Enzymes No results for input(s): TROPONINI in the last 168 hours. ------------------------------------------------------------------------------------------------------------------  RADIOLOGY:  No results found.  EKG:   Orders placed or performed during the hospital encounter of 06/05/15  . ED EKG  . ED EKG  . EKG 12-Lead  . EKG 12-Lead  . EKG    ASSESSMENT AND PLAN:   65y/o female with past medical history significant for sickle cell anemia, transfusion dependent, hypertension, CK 80, atrial myxoma and congestive heart failure sent in from nephrology office secondary to worsening renal failure and anasarca  #1 anasarca-secondary to worsening renal failure, refractory to outpatient Diuril takes. -Started on Lasix drip. Continue Lasix drip for now. Monitor urine output. Some improvement noted. -Being managed by nephrology  #2 acute renal  failure with CKD stage IV-with hyperkalemia and metabolic acidosis. -Acute renal failure triggered by  severe anemia and hypotension. -Appreciate nephrology consult. Continue sodium bicarbonate for acidosis for now. -No acute indication for hemodialysis at this time. Vascular consult discontinued -Continue to monitor renal function at this time. Hold ramipril  #3 acute on chronic anemia-due to sickle cell disease and anemia of chronic disease. -Baseline hemoglobin greater than 6.5 according to patient lately -Received 1 unit packed RBC transfusion yesterday with appropriate improvement in her hemoglobin to 6.1 today. One more unit today. -Monitor hemoglobin  #4 paroxysmal atrial fibrillation-continue amiodarone as history of wide-complex tachycardia.  #5 DVT prophylaxis-renally dosed Lovenox   Patient is ambulatory at this time. Likely no physical therapy needs at discharge.   All the records are reviewed and case discussed with Care Management/Social Workerr. Management plans discussed with the patient, family and they are in agreement.  CODE STATUS: Full Code  TOTAL TIME TAKING CARE OF THIS PATIENT: 37 minutes.   POSSIBLE D/C IN 1-2 DAYS, DEPENDING ON CLINICAL CONDITION.   Wynn Alldredge M.D on 08/29/2015 at 3:22 PM  Between 7am to 6pm - Pager - [phone]  After 6pm go to [email or web address] - password [email or web address] [facility]  Office  [phone]  CC: Primary care physician; [name], MD

## 2015-08-29 NOTE — Progress Notes (Signed)
Discussed with Dr. Juleen China today.  With BUN and Cr better today, hold on dialysis catheter for now.  Will hold on consult for now but will be happy to see if renal function worsens and she needs access

## 2015-08-30 DIAGNOSIS — J449 Chronic obstructive pulmonary disease, unspecified: Secondary | ICD-10-CM

## 2015-08-30 DIAGNOSIS — D571 Sickle-cell disease without crisis: Secondary | ICD-10-CM

## 2015-08-30 DIAGNOSIS — D631 Anemia in chronic kidney disease: Secondary | ICD-10-CM

## 2015-08-30 DIAGNOSIS — Z87891 Personal history of nicotine dependence: Secondary | ICD-10-CM

## 2015-08-30 DIAGNOSIS — M109 Gout, unspecified: Secondary | ICD-10-CM

## 2015-08-30 DIAGNOSIS — E559 Vitamin D deficiency, unspecified: Secondary | ICD-10-CM

## 2015-08-30 DIAGNOSIS — I4891 Unspecified atrial fibrillation: Secondary | ICD-10-CM

## 2015-08-30 DIAGNOSIS — D151 Benign neoplasm of heart: Secondary | ICD-10-CM

## 2015-08-30 DIAGNOSIS — I129 Hypertensive chronic kidney disease with stage 1 through stage 4 chronic kidney disease, or unspecified chronic kidney disease: Secondary | ICD-10-CM

## 2015-08-30 DIAGNOSIS — R609 Edema, unspecified: Secondary | ICD-10-CM

## 2015-08-30 DIAGNOSIS — I509 Heart failure, unspecified: Secondary | ICD-10-CM

## 2015-08-30 DIAGNOSIS — R635 Abnormal weight gain: Secondary | ICD-10-CM

## 2015-08-30 DIAGNOSIS — M818 Other osteoporosis without current pathological fracture: Secondary | ICD-10-CM

## 2015-08-30 DIAGNOSIS — R0602 Shortness of breath: Secondary | ICD-10-CM

## 2015-08-30 DIAGNOSIS — N189 Chronic kidney disease, unspecified: Secondary | ICD-10-CM

## 2015-08-30 DIAGNOSIS — Z803 Family history of malignant neoplasm of breast: Secondary | ICD-10-CM

## 2015-08-30 LAB — CBC
HEMATOCRIT: 20.2 % — AB (ref 35.0–47.0)
Hemoglobin: 6.6 g/dL — ABNORMAL LOW (ref 12.0–16.0)
MCH: 27.3 pg (ref 26.0–34.0)
MCHC: 32.7 g/dL (ref 32.0–36.0)
MCV: 83.5 fL (ref 80.0–100.0)
PLATELETS: 279 10*3/uL (ref 150–440)
RBC: 2.42 MIL/uL — ABNORMAL LOW (ref 3.80–5.20)
RDW: 18.1 % — AB (ref 11.5–14.5)
WBC: 8.5 10*3/uL (ref 3.6–11.0)

## 2015-08-30 LAB — BASIC METABOLIC PANEL
ANION GAP: 9 (ref 5–15)
BUN: 60 mg/dL — AB (ref 6–20)
CALCIUM: 8.2 mg/dL — AB (ref 8.9–10.3)
CO2: 24 mmol/L (ref 22–32)
CREATININE: 2.97 mg/dL — AB (ref 0.44–1.00)
Chloride: 103 mmol/L (ref 101–111)
GFR calc Af Amer: 18 mL/min — ABNORMAL LOW (ref >60)
GFR, EST NON AFRICAN AMERICAN: 16 mL/min — AB (ref >60)
GLUCOSE: 102 mg/dL — AB (ref 65–99)
Potassium: 4.1 mmol/L (ref 3.5–5.1)
Sodium: 136 mmol/L (ref 135–145)

## 2015-08-30 LAB — PREPARE RBC (CROSSMATCH)

## 2015-08-30 LAB — GLUCOSE, CAPILLARY: GLUCOSE-CAPILLARY: 83 mg/dL (ref 65–99)

## 2015-08-30 MED ORDER — FUROSEMIDE 10 MG/ML IJ SOLN
10.0000 mg/h | INTRAVENOUS | Status: DC
  Filled 2015-08-30: qty 25

## 2015-08-30 MED ORDER — FUROSEMIDE 10 MG/ML IJ SOLN
10.0000 mg/h | INTRAVENOUS | Status: DC
  Administered 2015-08-30: 10 mg/h via INTRAVENOUS
  Filled 2015-08-30: qty 25

## 2015-08-30 MED ORDER — SODIUM CHLORIDE 0.9 % IV SOLN
Freq: Once | INTRAVENOUS | Status: AC
  Administered 2015-08-30: 11:00:00 via INTRAVENOUS

## 2015-08-30 NOTE — Progress Notes (Signed)
[facility]  ROUNDING NOTE   Subjective:   Furosemide gtt 10mg /hr UOP 3200  Objective:  Vital signs in last 24 hours:  Temp:  [98 F (36.7 C)-98.4 F (36.9 C)] 98 F (36.7 C) (03/31 [id number]) Pulse Rate:  [76-89] 76 (03/31 0611) Resp:  [17-24] 23 (03/31 0611) BP: (97-147)/(44-120) 97/51 mmHg (03/31 0611) SpO2:  [96 %-100 %] 96 % (03/31 0611) Weight:  [72.394 kg (159 lb 9.6 oz)] 72.394 kg (159 lb 9.6 oz) (03/31 0300)  Weight change: -17.01 kg (-37 lb 8 oz) Filed Weights   08/29/15 0500 08/30/15 0300  Weight: 89.404 kg (197 lb 1.6 oz) 72.394 kg (159 lb 9.6 oz)    Intake/Output: I/O last 3 completed shifts: In: 1844.4 [P.O.:840; I.V.:325.4; Blood:679] Out: 5300 [Urine:5300]   Intake/Output this shift:  Total I/O In: 212.1 [P.O.:200; I.V.:12.1] Out: 500 [Urine:500]  Physical Exam: General: NAD, laying in bed  Head: Normocephalic, atraumatic. Moist oral mucosal membranes  Eyes: Anicteric, PERRL  Neck: Supple, trachea midline  Lungs:  clear  Heart: Regular rate and rhythm  Abdomen:  Soft, nontender,   Extremities: + peripheral edema.  Neurologic: Nonfocal, moving all four extremities  Skin: No lesions       Basic Metabolic Panel:  Recent Labs Lab 08/28/15 1216 08/29/15 0420 08/30/15 0409  NA 136 136 136  K 3.8 3.8 4.1  CL 104 103 103  CO2 24 25 24   GLUCOSE 95 127* 102*  BUN 60* 56* 60*  CREATININE 2.78* 2.66* 2.97*  CALCIUM 8.0* 7.9* 8.2*    Liver Function Tests: No results for input(s): AST, ALT, ALKPHOS, BILITOT, PROT, ALBUMIN in the last 168 hours. No results for input(s): LIPASE, AMYLASE in the last 168 hours. No results for input(s): AMMONIA in the last 168 hours.  CBC:  Recent Labs Lab 08/28/15 1216 08/28/15 1344 08/28/15 2312 08/29/15 0420 08/29/15 1637 08/30/15 0409  WBC 9.7  --   --  9.4  --  8.5  HGB 5.5* 5.4* 6.5* 6.1* 7.2* 6.6*  HCT 17.1* 16.5* 19.7* 18.6* 21.7* 20.2*  MCV 84.8  --   --  83.7  --  83.5  PLT 279  --    --  261  --  279    Cardiac Enzymes: No results for input(s): CKTOTAL, CKMB, CKMBINDEX, TROPONINI in the last 168 hours.  BNP: Invalid input(s): POCBNP  CBG:  Recent Labs Lab 08/29/15 0805 08/30/15 0806  GLUCAP 95 83    Microbiology: Results for orders placed or performed during the hospital encounter of 06/05/15  Body fluid culture     Status: None   Collection Time: 06/18/15  2:35 PM  Result Value Ref Range Status   Specimen Description ABDOMEN  Final   Special Requests NONE  Final   Gram Stain FEW WBC SEEN NO ORGANISMS SEEN   Final   Culture No growth aerobically or anaerobically.  Final   Report Status 06/22/2015 FINAL  Final  C difficile quick scan w PCR reflex     Status: None   Collection Time: 06/21/15  1:00 AM  Result Value Ref Range Status   C Diff antigen NEGATIVE NEGATIVE Final   C Diff toxin NEGATIVE NEGATIVE Final   C Diff interpretation Negative for C. difficile  Final  CULTURE, BLOOD (ROUTINE X 2) w Reflex to PCR ID Panel     Status: None   Collection Time: 06/22/15 12:10 PM  Result Value Ref Range Status   Specimen Description BLOOD LEFT ANTECUBITAL  Final  Special Requests BOTTLES DRAWN AEROBIC AND ANAEROBIC  3CC  Final   Culture  Setup Time   Final    GRAM POSITIVE COCCI IN BOTH AEROBIC AND ANAEROBIC BOTTLES CRITICAL RESULT CALLED TO, READ BACK BY AND VERIFIED WITH: NATE COOKSON AT [place] 06/23/15.PMH CONFIRMED BY RWW    Culture   Final    ENTEROCOCCUS FAECALIS IN BOTH AEROBIC AND ANAEROBIC BOTTLES    Report Status 06/25/2015 FINAL  Final   Organism ID, Bacteria ENTEROCOCCUS FAECALIS  Final      Susceptibility   Enterococcus faecalis - MIC*    AMPICILLIN <=2 SENSITIVE Sensitive     LINEZOLID 2 SENSITIVE Sensitive     VANCOMYCIN Value in next row Sensitive      SENSITIVE1    GENTAMICIN SYNERGY Value in next row Sensitive      SENSITIVE1    * ENTEROCOCCUS FAECALIS  CULTURE, BLOOD (ROUTINE X 2) w Reflex to PCR ID Panel     Status: None    Collection Time: 06/22/15 12:21 PM  Result Value Ref Range Status   Specimen Description BLOOD RIGHT ARM  Final   Special Requests BOTTLES DRAWN AEROBIC AND ANAEROBIC  [place]  Final   Culture  Setup Time   Final    GRAM POSITIVE COCCI IN BOTH AEROBIC AND ANAEROBIC BOTTLES CRITICAL RESULT CALLED TO, READ BACK BY AND VERIFIED WITH: NATE COOKSON AT [place] 06/23/15.PMH CONFIRMED BY RWW    Culture   Final    ENTEROCOCCUS FAECALIS IN BOTH AEROBIC AND ANAEROBIC BOTTLES    Report Status 06/25/2015 FINAL  Final   Organism ID, Bacteria ENTEROCOCCUS FAECALIS  Final      Susceptibility   Enterococcus faecalis - MIC*    AMPICILLIN <=2 SENSITIVE Sensitive     LINEZOLID 2 SENSITIVE Sensitive     VANCOMYCIN Value in next row Sensitive      SENSITIVE1    GENTAMICIN SYNERGY Value in next row Sensitive      SENSITIVE1    * ENTEROCOCCUS FAECALIS  Blood Culture ID Panel (Reflexed)     Status: Abnormal   Collection Time: 06/22/15 12:21 PM  Result Value Ref Range Status   Enterococcus species DETECTED (A) NOT DETECTED Final    Comment: CRITICAL RESULT CALLED TO, READ BACK BY AND VERIFIED WITH: NATE COOKSON AT 0330 06/23/15.PMH    Listeria monocytogenes NOT DETECTED NOT DETECTED Final   Staphylococcus species NOT DETECTED NOT DETECTED Final   Staphylococcus aureus NOT DETECTED NOT DETECTED Final   Streptococcus species NOT DETECTED NOT DETECTED Final   Streptococcus agalactiae NOT DETECTED NOT DETECTED Final   Streptococcus pneumoniae NOT DETECTED NOT DETECTED Final   Streptococcus pyogenes NOT DETECTED NOT DETECTED Final   Acinetobacter baumannii NOT DETECTED NOT DETECTED Final   Enterobacteriaceae species NOT DETECTED NOT DETECTED Final   Enterobacter cloacae complex NOT DETECTED NOT DETECTED Final   Escherichia coli NOT DETECTED NOT DETECTED Final   Klebsiella oxytoca NOT DETECTED NOT DETECTED Final   Klebsiella pneumoniae NOT DETECTED NOT DETECTED Final   Proteus species NOT DETECTED NOT DETECTED  Final   Serratia marcescens NOT DETECTED NOT DETECTED Final   Haemophilus influenzae NOT DETECTED NOT DETECTED Final   Neisseria meningitidis NOT DETECTED NOT DETECTED Final   Pseudomonas aeruginosa NOT DETECTED NOT DETECTED Final   Candida albicans NOT DETECTED NOT DETECTED Final   Candida glabrata NOT DETECTED NOT DETECTED Final   Candida krusei NOT DETECTED NOT DETECTED Final   Candida parapsilosis NOT DETECTED NOT DETECTED Final  Candida tropicalis NOT DETECTED NOT DETECTED Final   Carbapenem resistance NOT DETECTED NOT DETECTED Final   Methicillin resistance NOT DETECTED NOT DETECTED Final   Vancomycin resistance NOT DETECTED NOT DETECTED Final    Coagulation Studies: No results for input(s): LABPROT, INR in the last 72 hours.  Urinalysis: No results for input(s): COLORURINE, LABSPEC, PHURINE, GLUCOSEU, HGBUR, BILIRUBINUR, KETONESUR, PROTEINUR, UROBILINOGEN, NITRITE, LEUKOCYTESUR in the last 72 hours.  Invalid input(s): APPERANCEUR    Imaging: No results found.   Medications:   . furosemide (LASIX) infusion     . sodium chloride   Intravenous Once  . sodium chloride   Intravenous Once  . amiodarone  200 mg Oral Daily  . calcitRIOL  0.25 mcg Oral Daily  . docusate sodium  100 mg Oral BID  . enoxaparin (LOVENOX) injection  30 mg Subcutaneous Q24H  . folic acid  1 mg Oral Daily  . mometasone-formoterol  2 puff Inhalation BID  . sodium bicarbonate  650 mg Oral Daily   acetaminophen **OR** acetaminophen, albuterol, bisacodyl, HYDROcodone-acetaminophen, ondansetron **OR** ondansetron (ZOFRAN) IV, zolpidem  Assessment/ Plan:   Ms. Bartl is a 65 year old black female with sickle cell disease, atrial myxoma who presents 08/28/2015 for Acute on Chronic Renal Failure   1. Acute Renal Failure on Chronic Kidney Disease stage IV with hyperkalemia and metabolic acidosis:  Acute renal failure due to severe anemia, acute congestive heart failure and hypotension. However concern  for limited recovery and progression of disease.  Now has failed outpatient diuretic therapy.  - furosemide gtt.  Chronic Kidney Disease is secondary to NSAID use and sickle cell anemia.  - Continue sodium bicarbonate for metabolic acidosis.  - Currently off ramipril - hold for acute renal failure, advanced renal disease and hyperkalemia   2. 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  - consult hematology  3. Hypertension: low. Holding home agents  4. Secondary Hyperparathyroidism: PTH 197. Calcium and phosphorus at goal.  - Continue calcitriol   LOS: 2 Alireza Pollack 3/31/201710:36 AM

## 2015-08-30 NOTE — Consult Note (Signed)
[facility] NOTE  Patient Care Team: Jackolyn Confer, MD as PCP - General (Internal Medicine) Seeplaputhur Robinette Haines, MD (General Surgery) Rubbie Battiest, NP as Nurse Practitioner (Gerontology)  CHIEF COMPLAINTS/PURPOSE OF CONSULTATION: Severe Anemia  HISTORY OF PRESENTING ILLNESS:  Jane Short 65 y.o.  female  With a history of sickle cell disease-  And history of severe anemia/  And chronic kidney disease with CHF and COPD  Is currently admitted to the hospital for Failure of outpatient diuresis.  Patient last  Few months is noted to have  Weight gain and also worsening swelling in the legs.  She is currently on Lasix drip.  Patient stated that she is urinating well.   With regards to history of sickle cell disease,  Patient states that she had episodes of pain crisis while growing up;  Which have gotten better as she grown older.  Interestingly, She has not had  Obvious complications of  Sickle cell disease-  Like strokes or acute chest syndrome.  She states to have never received exchange transfusions.     With regards to anemia she states that-  Her hemoglobin baseline is on 6-7;  And she gets  Symptomatic with worsening shortness of breath and fatigue when hemoglobin drops to less than 6.  Patient states that  She received blood transfusions  Approximately once a month growing up.  However in the last few years-  She  Had needed blood transfusion more often.    Her appetite is good.  No nausea no vomiting.  No headaches  Mild shortness of breath on exertion.   Leg swelling is improving.  ROS: A complete 10 point review of system is done which is negative except mentioned above in history of present illness  MEDICAL HISTORY:  Past Medical History  Diagnosis Date  . Sickle cell anemia (HCC)     sickle cell disease  . Vitamin D deficiency   . Hypertension   . Osteoporosis 2011    osteopenia  . 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 Cheree Ditto at [facility]   . Gout   . Sickle cell anemia (HCC)   . Lichen planus   . SCA-1 (spinocerebellar ataxia type 1) ([place])   . COPD (chronic obstructive pulmonary disease) (HCC)     symptoms Dr. Patricia Pesa   . CHF (congestive heart failure) ([place])   . Congestive heart failure (CHF) ([place])   . Congestive heart failure (CHF) ([place]) 06/2015    SURGICAL HISTORY: Past Surgical History  Procedure Laterality Date  . Tubal ligation    . Gallbladder surgery    . Colonoscopy  2014    [facility]  . Peripheral vascular catheterization Left 06/14/2015    Procedure: Dialysis/Perma Catheter Insertion;  Surgeon: Katha Cabal, MD;  Location: [facility];  Service: Cardiovascular;  Laterality: Left;    SOCIAL HISTORY: Social History   Social History  . Marital Status: Married    Spouse Name: N/A  . Number of Children: 1  . Years of Education: N/A   Occupational History  .      textiles   Social History Main Topics  . Smoking status: Former Smoker -- 0.25 packs/day for 30 years    Types: Cigarettes    Quit date: 04/29/2015  . Smokeless tobacco: Never Used     Comment: quit 1 month ago  . Alcohol Use: No  . Drug Use: No  . Sexual Activity: Not on  file   Other Topics Concern  . Not on file   Social History Narrative   Regular Exercise -  NO   Daily Caffeine Use:  1 cup coffee   Lives with her sister.   Working as a Building control surveyor in [place]: Family History  Problem Relation Age of Onset  . Heart disease Father   . Diabetes Father   . Diabetes Sister   . Cancer Mother     breast cancer  . Cancer Maternal Aunt     Breast cancer    ALLERGIES:  is allergic to ramipril.  MEDICATIONS:  Current Facility-Administered Medications  Medication Dose Route Frequency Provider Last Rate Last Dose  . 0.9 %  sodium chloride infusion   Intravenous Once Saundra Shelling, MD      . acetaminophen (TYLENOL) tablet 650 mg  650 mg Oral Q6H PRN Epifanio Lesches, MD    650 mg at 08/29/15 1442   Or  . acetaminophen (TYLENOL) suppository 650 mg  650 mg Rectal Q6H PRN Epifanio Lesches, MD      . albuterol (PROVENTIL) (2.5 MG/3ML) 0.083% nebulizer solution 2.5 mg  2.5 mg Nebulization Q4H PRN Epifanio Lesches, MD      . amiodarone (PACERONE) tablet 200 mg  200 mg Oral Daily Epifanio Lesches, MD   200 mg at 08/30/15 1021  . bisacodyl (DULCOLAX) EC tablet 5 mg  5 mg Oral Daily PRN Epifanio Lesches, MD      . calcitRIOL (ROCALTROL) capsule 0.25 mcg  0.25 mcg Oral Daily Epifanio Lesches, MD   0.25 mcg at 08/30/15 1020  . docusate sodium (COLACE) capsule 100 mg  100 mg Oral BID Epifanio Lesches, MD   100 mg at 08/29/15 1049  . enoxaparin (LOVENOX) injection 30 mg  30 mg Subcutaneous Q24H Epifanio Lesches, MD   30 mg at 08/29/15 2054  . folic acid (FOLVITE) tablet 1 mg  1 mg Oral Daily Epifanio Lesches, MD   1 mg at 08/30/15 1021  . furosemide (LASIX) 250 mg in dextrose 5 % 250 mL (1 mg/mL) infusion  10 mg/hr Intravenous Continuous Lavonia Dana, MD 10 mL/hr at 08/30/15 1701 10 mg/hr at 08/30/15 1701  . HYDROcodone-acetaminophen (NORCO/VICODIN) 5-325 MG per tablet 1-2 tablet  1-2 tablet Oral Q4H PRN Epifanio Lesches, MD   1 tablet at 08/30/15 1402  . mometasone-formoterol (DULERA) 200-5 MCG/ACT inhaler 2 puff  2 puff Inhalation BID Epifanio Lesches, MD   2 puff at 08/30/15 1701  . ondansetron (ZOFRAN) tablet 4 mg  4 mg Oral Q6H PRN Epifanio Lesches, MD       Or  . ondansetron (ZOFRAN) injection 4 mg  4 mg Intravenous Q6H PRN Epifanio Lesches, MD      . sodium bicarbonate tablet 650 mg  650 mg Oral Daily Epifanio Lesches, MD   650 mg at 08/30/15 1020  . zolpidem (AMBIEN) tablet 5 mg  5 mg Oral QHS PRN Epifanio Lesches, MD       Facility-Administered Medications Ordered in Other Encounters  Medication Dose Route Frequency Provider Last Rate Last Dose  . heparin lock flush 100 unit/mL  250 Units Intracatheter PRN Leia Alf, MD           .  PHYSICAL EXAMINATION:   Filed Vitals:   08/30/15 1155 08/30/15 1432  BP: 114/41 107/58  Pulse: 82 76  Temp: 97.7 F (36.5 C) 97.9 F (36.6 C)  Resp: 18 18  Filed Weights   08/29/15 0500 08/30/15 0300  Weight: 197 lb 1.6 oz (89.404 kg) 159 lb 9.6 oz (72.394 kg)    GENERAL: Well-nourished well-developed; Alert, no distress and comfortable.  Accompanied by her husband.  EYES: positive for pallor.  OROPHARYNX: no thrush or ulceration.  NECK: supple, no masses felt LYMPH:  no palpable lymphadenopathy in the cervical, axillary or inguinal regions LUNGS:  Decreased breath sounds bilaterally at the bases. No wheeze or crackles HEART/CVS: regular rate & rhythm and no murmurs; Positive for lower extremity edema. ABDOMEN: abdomen soft, non-tender and normal bowel sounds Musculoskeletal:no cyanosis of digits and no clubbing  PSYCH: alert & oriented x 3 with fluent speech NEURO: no focal motor/sensory deficits SKIN:  no rashes or significant lesions  LABORATORY DATA:  I have reviewed the data as listed Lab Results  Component Value Date   WBC 8.5 08/30/2015   HGB 6.6* 08/30/2015   HCT 20.2* 08/30/2015   MCV 83.5 08/30/2015   PLT 279 08/30/2015    Recent Labs  06/13/15 0455  06/15/15 0458  06/20/15 0539  06/23/15 0513 06/24/15 0453 06/24/15 0729 07/31/15 1028 08/21/15 1355 08/28/15 1216 08/29/15 0420 08/30/15 0409  NA 134*  < > 137  < > 136  < > 136 133* 131* 132* 137 136 136 136  K 5.4*  < > 5.7*  < > 4.6  < > 4.3 4.9 4.8 4.1 3.3* 3.8 3.8 4.1  CL 101  < > 100*  < > 103  < > 98* 97* 95* 106 103 104 103 103  CO2 24  < > 28  < > 24  < > 28 28 26  19* 25 24 25 24   GLUCOSE 133*  < > 114*  < > 140*  < > 129* 106* 94 108* 113* 95 127* 102*  BUN 111*  < > 94*  < > 73*  < > 57* 61* 62* 53* 51* 60* 56* 60*  CREATININE 3.19*  < > 2.66*  < > 1.71*  < > UNABLE TO REPORT DUE TO ICTERUS UNABLE TO REPORT DUE TO ICTERUS UNABLE TO REPORT DUE TO ICTERUS 2.85* 2.97* 2.78*  2.66* 2.97*  CALCIUM 9.7  < > 9.9  < > 10.1  < > 9.6 9.9 9.8 8.7* 8.2* 8.0* 7.9* 8.2*  GFRNONAA 14*  < > 18*  < > 30*  < > NOT CALCULATED NOT CALCULATED NOT CALCULATED 16* 16* 17* 18* 16*  GFRAA 17*  < > 21*  < > 35*  < > NOT CALCULATED NOT CALCULATED NOT CALCULATED 19* 18* 20* 21* 18*  PROT 6.8  --  6.6  < > 6.5  < > 5.8* 6.0*  --  6.1* 5.7*  --   --   --   ALBUMIN 2.9*  --  2.9*  < > 3.1*  < > 2.6* 2.6* 2.6* 2.6* 2.8*  --   --   --   AST 38  --  38  < > 59*  < > 25 27  --  38 48*  --   --   --   ALT 28  --  30  < > 66*  < > 40 40  --  23 32  --   --   --   ALKPHOS 124  --  101  < > 122  < > 80 82  --  192* 152*  --   --   --   BILITOT 15.5*  --  17.5*  < >  15.6*  < > 41.1* 42.5*  --  6.2* 4.6*  --   --   --   BILIDIR 8.3*  --  9.1*  --  7.9*  --  25.9*  --   --   --   --   --   --   --   IBILI 7.2*  --  8.4*  --  7.7*  --   --   --   --   --   --   --   --   --   < > = values in this interval not displayed.   ASSESSMENT & PLAN:   #  Severe anemia hemoglobin around 6/  Likely from sickle cell disease-   Complicated by chronic kidney disease..  Patient states that  This has been her baseline.  Recommend transfusing to keep the hemoglobin  6-6.5.  Recommend checking  LDH/ haptoglobin-  To rule out  Active hemolysis. I doubt  Any active hemolysis.  Procrit as needed basis  To keep the hemoglobin  6.5-7.    #  Sickle cell disease-  Hemoglobin electrophoresis  Done in March 2017-  Consistent with  Transfused  Sickle cell disease.  No  Sickle cell crisis at this time. As an outpatient basis. Recommend restarting Hydrea as an out patient.   #   History of elevated bilirubin/ up to 40  In January 2017.  Status post liver biopsy- at [facility]  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  Recommend checking LFTs in the morning.  #  History of A. Fib/ as per cardiology-  Not a good candidate for  Anticoagulation  Thank you Dr. Juleen China for allowing me to participate in the care of your pleasant patient.  Please do not hesitate to contact me with questions or concerns in the interim. Dr.Corcoran  Is available over the weekend for an acute  Issues.      Cammie Sickle, MD 08/30/2015 5:59 PM

## 2015-08-31 LAB — BASIC METABOLIC PANEL
Anion gap: 6 (ref 5–15)
BUN: 60 mg/dL — ABNORMAL HIGH (ref 6–20)
CALCIUM: 8.3 mg/dL — AB (ref 8.9–10.3)
CHLORIDE: 103 mmol/L (ref 101–111)
CO2: 28 mmol/L (ref 22–32)
CREATININE: 2.92 mg/dL — AB (ref 0.44–1.00)
GFR calc Af Amer: 18 mL/min — ABNORMAL LOW (ref >60)
GFR calc non Af Amer: 16 mL/min — ABNORMAL LOW (ref >60)
GLUCOSE: 103 mg/dL — AB (ref 65–99)
Potassium: 4.2 mmol/L (ref 3.5–5.1)
Sodium: 137 mmol/L (ref 135–145)

## 2015-08-31 LAB — CBC
HEMATOCRIT: 22.5 % — AB (ref 35.0–47.0)
HEMOGLOBIN: 7.5 g/dL — AB (ref 12.0–16.0)
MCH: 28.5 pg (ref 26.0–34.0)
MCHC: 33.5 g/dL (ref 32.0–36.0)
MCV: 85.1 fL (ref 80.0–100.0)
Platelets: 317 10*3/uL (ref 150–440)
RBC: 2.64 MIL/uL — ABNORMAL LOW (ref 3.80–5.20)
RDW: 17.5 % — AB (ref 11.5–14.5)
WBC: 7.8 10*3/uL (ref 3.6–11.0)

## 2015-08-31 LAB — HEPATIC FUNCTION PANEL
ALK PHOS: 156 U/L — AB (ref 38–126)
ALT: 25 U/L (ref 14–54)
AST: 34 U/L (ref 15–41)
Albumin: 2.7 g/dL — ABNORMAL LOW (ref 3.5–5.0)
BILIRUBIN DIRECT: 1.8 mg/dL — AB (ref 0.1–0.5)
BILIRUBIN TOTAL: 3.8 mg/dL — AB (ref 0.3–1.2)
Indirect Bilirubin: 2 mg/dL — ABNORMAL HIGH (ref 0.3–0.9)
Total Protein: 5.6 g/dL — ABNORMAL LOW (ref 6.5–8.1)

## 2015-08-31 LAB — RETICULOCYTES
RBC.: 2.64 MIL/uL — ABNORMAL LOW (ref 3.80–5.20)
RETIC CT PCT: 9.5 % — AB (ref 0.4–3.1)
Retic Count, Absolute: 250.8 10*3/uL — ABNORMAL HIGH (ref 19.0–183.0)

## 2015-08-31 LAB — LACTATE DEHYDROGENASE: LDH: 193 U/L — AB (ref 98–192)

## 2015-08-31 LAB — GLUCOSE, CAPILLARY: GLUCOSE-CAPILLARY: 97 mg/dL (ref 65–99)

## 2015-08-31 MED ORDER — FUROSEMIDE 40 MG PO TABS
80.0000 mg | ORAL_TABLET | Freq: Two times a day (BID) | ORAL | Status: DC
  Administered 2015-08-31 – 2015-09-01 (×2): 80 mg via ORAL
  Filled 2015-08-31 (×2): qty 2

## 2015-08-31 NOTE — Progress Notes (Signed)
[facility] [place] NAME: Jane Short    MR#:  [id number]  DATE OF BIRTH:  Jun 10, 1950  SUBJECTIVE:  CHIEF COMPLAINT:  No chief complaint on file.  - Admitted from Nephrology office for anasarca - still has worse lower extremity edema,  -  known sickle cell and transfusion dependant anemia, received total 3 units PRBC,  again Hb is low- give one more unit PRBC on 08/30/15.  Hb stable now, Nephrology decided to stop IV lasix drip and switch to oral.   Had good diuresis.  REVIEW OF SYSTEMS:  Review of Systems  Constitutional: Positive for malaise/fatigue. Negative for fever and chills.  HENT: Negative for congestion, ear discharge and nosebleeds.   Eyes: Negative for blurred vision and double vision.  Respiratory: Negative for cough, shortness of breath and wheezing.   Cardiovascular: Negative for chest pain, palpitations and leg swelling.  Gastrointestinal: Negative for nausea, vomiting, abdominal pain, diarrhea and constipation.  Genitourinary: Negative for dysuria and urgency.  Musculoskeletal: Positive for back pain. Negative for myalgias.  Neurological: Negative for dizziness, sensory change, speech change, focal weakness, seizures and headaches.  Psychiatric/Behavioral: Negative for depression.    DRUG ALLERGIES:   Allergies  Allergen Reactions  . Ramipril     cough    VITALS:  Blood pressure 110/60, pulse 76, temperature 97.9 F (36.6 C), temperature source Oral, resp. rate 18, height 5\' 7"  (1.702 m), weight 85.684 kg (188 lb 14.4 oz), SpO2 98 %.  PHYSICAL EXAMINATION:  Physical Exam  GENERAL:  65 y.o.-year-old patient lying in the bed with no acute distress.  EYES: Pupils equal, round, reactive to light and accommodation. No scleral icterus. Extraocular muscles intact. Conjunctival pallor noted. HEENT: Head atraumatic, normocephalic. Oropharynx and nasopharynx clear.  NECK:  Supple, no jugular venous distention.  No thyroid enlargement, no tenderness.  LUNGS: Normal breath sounds bilaterally, no wheezing, rales,rhonchi or crepitation. No use of accessory muscles of respiration. Decreased bibasilar breath sounds. CARDIOVASCULAR: S1, S2 normal. No murmurs, rubs, or gallops.  ABDOMEN: Soft, nontender, nondistended. Bowel sounds present. No organomegaly or mass.  EXTREMITIES: some edema in both legs . No cyanosis, or clubbing.  NEUROLOGIC: Cranial nerves II through XII are intact. Muscle strength 5/5 in all extremities. Sensation intact. Gait not checked.  PSYCHIATRIC: The patient is alert and oriented x 3.  SKIN: No obvious rash, lesion, or ulcer.    LABORATORY PANEL:   CBC  Recent Labs Lab 08/31/15 0456  WBC 7.8  HGB 7.5*  HCT 22.5*  PLT 317   ------------------------------------------------------------------------------------------------------------------  Chemistries   Recent Labs Lab 08/31/15 0456  NA 137  K 4.2  CL 103  CO2 28  GLUCOSE 103*  BUN 60*  CREATININE 2.92*  CALCIUM 8.3*  AST 34  ALT 25  ALKPHOS 156*  BILITOT 3.8*   ------------------------------------------------------------------------------------------------------------------  Cardiac Enzymes No results for input(s): TROPONINI in the last 168 hours. ------------------------------------------------------------------------------------------------------------------  RADIOLOGY:  No results found.  EKG:   Orders placed or performed during the hospital encounter of 06/05/15  . ED EKG  . ED EKG  . EKG 12-Lead  . EKG 12-Lead  . EKG    ASSESSMENT AND PLAN:   65y/o female with past medical history significant for sickle cell anemia, transfusion dependent, hypertension, CK 80, atrial myxoma and congestive heart failure sent in from nephrology office secondary to worsening renal failure and anasarca  #1 anasarca-secondary to worsening renal failure, refractory to outpatient Diuril takes. -Started on Lasix  drip. Continue Lasix drip for now. Monitor urine output.  improvement noted. -Being managed by nephrology - IV lasix switched to oral after good diuresis.  #2 acute renal failure with CKD stage IV-with hyperkalemia and metabolic acidosis. -Acute renal failure triggered by severe anemia and hypotension. -Appreciate nephrology consult. Continue sodium bicarbonate for acidosis for now. -No acute indication for hemodialysis at this time. Vascular consult discontinued -Continue to monitor renal function at this time. Hold ramipril  #3 acute on chronic anemia-due to sickle cell disease and anemia of chronic disease. -Baseline hemoglobin greater than 6.5 according to patient lately - received total 4 units PRBC in this admission - Apprecited hematology consult. Ordered LDH and haptoglobin.  #4 paroxysmal atrial fibrillation-continue amiodarone as history of wide-complex tachycardia.  #5 DVT prophylaxis-renally dosed Lovenox   Patient is ambulatory at this time. Likely no physical therapy needs at discharge.   All the records are reviewed and case discussed with Care Management/Social Workerr. Management plans discussed with the patient, family and they are in agreement.  CODE STATUS: Full Code  TOTAL TIME TAKING CARE OF THIS PATIENT: 35 minutes.   POSSIBLE D/C IN 1-2 DAYS, DEPENDING ON CLINICAL CONDITION.   Vaughan Basta M.D on 08/31/2015 at 11:50 AM  Between 7am to 6pm - Pager - [phone]  After 6pm go to [email or web address] - password [email or web address]  Office  [phone]  CC: Primary care physician; [name], MD

## 2015-08-31 NOTE — Progress Notes (Signed)
[facility]  ROUNDING NOTE   Subjective:   Furosemide gtt 10mg /hr UOP 4550 (3200)  Creatinine 2.92 (2.97)  Objective:  Vital signs in last 24 hours:  Temp:  [97.7 F (36.5 C)-98.4 F (36.9 C)] 97.9 F (36.6 C) (04/01 0602) Pulse Rate:  [76-110] 76 (04/01 0602) Resp:  [18-20] 18 (04/01 0602) BP: (96-123)/(41-70) 109/70 mmHg (04/01 0602) SpO2:  [96 %-98 %] 98 % (04/01 0602) Weight:  [85.684 kg (188 lb 14.4 oz)] 85.684 kg (188 lb 14.4 oz) (04/01 0500)  Weight change: 13.29 kg (29 lb 4.8 oz) Filed Weights   08/29/15 0500 08/30/15 0300 08/31/15 0500  Weight: 89.404 kg (197 lb 1.6 oz) 72.394 kg (159 lb 9.6 oz) 85.684 kg (188 lb 14.4 oz)    Intake/Output: I/O last 3 completed shifts: In: 1541.9 [P.O.:800; I.V.:399.9; Blood:342] Out: [id number]   Intake/Output this shift:     Physical Exam: General: NAD, laying in bed  Head: Normocephalic, atraumatic. Moist oral mucosal membranes  Eyes: Anicteric, PERRL  Neck: Supple, trachea midline  Lungs:  clear  Heart: Regular rate and rhythm  Abdomen:  Soft, nontender,   Extremities: + peripheral edema.  Neurologic: Nonfocal, moving all four extremities  Skin: No lesions       Basic Metabolic Panel:  Recent Labs Lab 08/28/15 1216 08/29/15 0420 08/30/15 0409 08/31/15 0456  NA 136 136 136 137  K 3.8 3.8 4.1 4.2  CL 104 103 103 103  CO2 24 25 24 28   GLUCOSE 95 127* 102* 103*  BUN 60* 56* 60* 60*  CREATININE 2.78* 2.66* 2.97* 2.92*  CALCIUM 8.0* 7.9* 8.2* 8.3*    Liver Function Tests:  Recent Labs Lab 08/31/15 0456  AST 34  ALT 25  ALKPHOS 156*  BILITOT 3.8*  PROT 5.6*  ALBUMIN 2.7*   No results for input(s): LIPASE, AMYLASE in the last 168 hours. No results for input(s): AMMONIA in the last 168 hours.  CBC:  Recent Labs Lab 08/28/15 1216  08/28/15 2312 08/29/15 0420 08/29/15 1637 08/30/15 0409 08/31/15 0456  WBC 9.7  --   --  9.4  --  8.5 7.8  HGB 5.5*  < > 6.5* 6.1* 7.2*  6.6* 7.5*  HCT 17.1*  < > 19.7* 18.6* 21.7* 20.2* 22.5*  MCV 84.8  --   --  83.7  --  83.5 85.1  PLT 279  --   --  261  --  279 317  < > = values in this interval not displayed.  Cardiac Enzymes: No results for input(s): CKTOTAL, CKMB, CKMBINDEX, TROPONINI in the last 168 hours.  BNP: Invalid input(s): POCBNP  CBG:  Recent Labs Lab 08/29/15 0805 08/30/15 0806 08/31/15 0746  GLUCAP 95 83 97    Microbiology: Results for orders placed or performed during the hospital encounter of 06/05/15  Body fluid culture     Status: None   Collection Time: 06/18/15  2:35 PM  Result Value Ref Range Status   Specimen Description ABDOMEN  Final   Special Requests NONE  Final   Gram Stain FEW WBC SEEN NO ORGANISMS SEEN   Final   Culture No growth aerobically or anaerobically.  Final   Report Status 06/22/2015 FINAL  Final  C difficile quick scan w PCR reflex     Status: None   Collection Time: 06/21/15  1:00 AM  Result Value Ref Range Status   C Diff antigen NEGATIVE NEGATIVE Final   C Diff toxin NEGATIVE NEGATIVE Final   C  Diff interpretation Negative for C. difficile  Final  CULTURE, BLOOD (ROUTINE X 2) w Reflex to PCR ID Panel     Status: None   Collection Time: 06/22/15 12:10 PM  Result Value Ref Range Status   Specimen Description BLOOD LEFT ANTECUBITAL  Final   Special Requests BOTTLES DRAWN AEROBIC AND ANAEROBIC  3CC  Final   Culture  Setup Time   Final    GRAM POSITIVE COCCI IN BOTH AEROBIC AND ANAEROBIC BOTTLES CRITICAL RESULT CALLED TO, READ BACK BY AND VERIFIED WITH: NATE COOKSON AT [place] 06/23/15.PMH CONFIRMED BY [facility]    Culture   Final    ENTEROCOCCUS FAECALIS IN BOTH AEROBIC AND ANAEROBIC BOTTLES    Report Status 06/25/2015 FINAL  Final   Organism ID, Bacteria ENTEROCOCCUS FAECALIS  Final      Susceptibility   Enterococcus faecalis - MIC*    AMPICILLIN <=2 SENSITIVE Sensitive     LINEZOLID 2 SENSITIVE Sensitive     VANCOMYCIN Value in next row Sensitive       SENSITIVE1    GENTAMICIN SYNERGY Value in next row Sensitive      SENSITIVE1    * ENTEROCOCCUS FAECALIS  CULTURE, BLOOD (ROUTINE X 2) w Reflex to PCR ID Panel     Status: None   Collection Time: 06/22/15 12:21 PM  Result Value Ref Range Status   Specimen Description BLOOD RIGHT ARM  Final   Special Requests BOTTLES DRAWN AEROBIC AND ANAEROBIC  [place]  Final   Culture  Setup Time   Final    GRAM POSITIVE COCCI IN BOTH AEROBIC AND ANAEROBIC BOTTLES CRITICAL RESULT CALLED TO, READ BACK BY AND VERIFIED WITH: NATE COOKSON AT [place] 06/23/15.PMH CONFIRMED BY [facility]    Culture   Final    ENTEROCOCCUS FAECALIS IN BOTH AEROBIC AND ANAEROBIC BOTTLES    Report Status 06/25/2015 FINAL  Final   Organism ID, Bacteria ENTEROCOCCUS FAECALIS  Final      Susceptibility   Enterococcus faecalis - MIC*    AMPICILLIN <=2 SENSITIVE Sensitive     LINEZOLID 2 SENSITIVE Sensitive     VANCOMYCIN Value in next row Sensitive      SENSITIVE1    GENTAMICIN SYNERGY Value in next row Sensitive      SENSITIVE1    * ENTEROCOCCUS FAECALIS  Blood Culture ID Panel (Reflexed)     Status: Abnormal   Collection Time: 06/22/15 12:21 PM  Result Value Ref Range Status   Enterococcus species DETECTED (A) NOT DETECTED Final    Comment: CRITICAL RESULT CALLED TO, READ BACK BY AND VERIFIED WITH: NATE COOKSON AT 0330 06/23/15.PMH    Listeria monocytogenes NOT DETECTED NOT DETECTED Final   Staphylococcus species NOT DETECTED NOT DETECTED Final   Staphylococcus aureus NOT DETECTED NOT DETECTED Final   Streptococcus species NOT DETECTED NOT DETECTED Final   Streptococcus agalactiae NOT DETECTED NOT DETECTED Final   Streptococcus pneumoniae NOT DETECTED NOT DETECTED Final   Streptococcus pyogenes NOT DETECTED NOT DETECTED Final   Acinetobacter baumannii NOT DETECTED NOT DETECTED Final   Enterobacteriaceae species NOT DETECTED NOT DETECTED Final   Enterobacter cloacae complex NOT DETECTED NOT DETECTED Final   Escherichia coli NOT  DETECTED NOT DETECTED Final   Klebsiella oxytoca NOT DETECTED NOT DETECTED Final   Klebsiella pneumoniae NOT DETECTED NOT DETECTED Final   Proteus species NOT DETECTED NOT DETECTED Final   Serratia marcescens NOT DETECTED NOT DETECTED Final   Haemophilus influenzae NOT DETECTED NOT DETECTED Final   Neisseria meningitidis NOT  DETECTED NOT DETECTED Final   Pseudomonas aeruginosa NOT DETECTED NOT DETECTED Final   Candida albicans NOT DETECTED NOT DETECTED Final   Candida glabrata NOT DETECTED NOT DETECTED Final   Candida krusei NOT DETECTED NOT DETECTED Final   Candida parapsilosis NOT DETECTED NOT DETECTED Final   Candida tropicalis NOT DETECTED NOT DETECTED Final   Carbapenem resistance NOT DETECTED NOT DETECTED Final   Methicillin resistance NOT DETECTED NOT DETECTED Final   Vancomycin resistance NOT DETECTED NOT DETECTED Final    Coagulation Studies: No results for input(s): LABPROT, INR in the last 72 hours.  Urinalysis: No results for input(s): COLORURINE, LABSPEC, PHURINE, GLUCOSEU, HGBUR, BILIRUBINUR, KETONESUR, PROTEINUR, UROBILINOGEN, NITRITE, LEUKOCYTESUR in the last 72 hours.  Invalid input(s): APPERANCEUR    Imaging: No results found.   Medications:     . sodium chloride   Intravenous Once  . amiodarone  200 mg Oral Daily  . calcitRIOL  0.25 mcg Oral Daily  . docusate sodium  100 mg Oral BID  . enoxaparin (LOVENOX) injection  30 mg Subcutaneous Q24H  . folic acid  1 mg Oral Daily  . furosemide  80 mg Oral BID  . mometasone-formoterol  2 puff Inhalation BID  . sodium bicarbonate  650 mg Oral Daily   acetaminophen **OR** acetaminophen, albuterol, bisacodyl, HYDROcodone-acetaminophen, ondansetron **OR** ondansetron (ZOFRAN) IV, zolpidem  Assessment/ Plan:   Jane Short is a 65 year old black female with sickle cell disease, atrial myxoma who presents 08/28/2015 for Acute on Chronic Renal Failure   1. Acute Renal Failure on Chronic Kidney Disease stage IV with  hyperkalemia and metabolic acidosis:  Acute renal failure due to severe anemia, acute congestive heart failure and hypotension. Now has failed outpatient diuretic therapy.  - furosemide - stop drip and restart 80mg  PO twice a day.  Chronic Kidney Disease is secondary to NSAID use and sickle cell anemia.  - Continue sodium bicarbonate for metabolic acidosis.  - Currently off ramipril - hold for acute renal failure, advanced renal disease and hyperkalemia   2. 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  - Appreciate hematology input.   3. Hypertension: low. Holding home agents  4. Secondary Hyperparathyroidism: PTH 197. Calcium and phosphorus at goal.  - Continue calcitriol   LOS: [place], Jane Short 4/1/20179:36 AM

## 2015-08-31 NOTE — Progress Notes (Signed)
[place] at [place] NAME: Jane Short    MR#:  [id number]  DATE OF BIRTH:  05/27/1951  SUBJECTIVE:  CHIEF COMPLAINT:  No chief complaint on file.  - Admitted from Nephrology office for anasarca - still has worse lower extremity edema,  -  known sickle cell and transfusion dependant anemia, received total 3 units PRBC, today again Hb is low- give one more unit PRBC.  REVIEW OF SYSTEMS:  Review of Systems  Constitutional: Positive for malaise/fatigue. Negative for fever and chills.  HENT: Negative for congestion, ear discharge and nosebleeds.   Eyes: Negative for blurred vision and double vision.  Respiratory: Negative for cough, shortness of breath and wheezing.   Cardiovascular: Positive for leg swelling. Negative for chest pain and palpitations.  Gastrointestinal: Negative for nausea, vomiting, abdominal pain, diarrhea and constipation.  Genitourinary: Negative for dysuria and urgency.  Musculoskeletal: Positive for back pain. Negative for myalgias.  Neurological: Negative for dizziness, sensory change, speech change, focal weakness, seizures and headaches.  Psychiatric/Behavioral: Negative for depression.    DRUG ALLERGIES:   Allergies  Allergen Reactions  . Ramipril     cough    VITALS:  Blood pressure 109/70, pulse 76, temperature 97.9 F (36.6 C), temperature source Oral, resp. rate 18, height 5\' 7"  (1.702 m), weight 85.684 kg (188 lb 14.4 oz), SpO2 98 %.  PHYSICAL EXAMINATION:  Physical Exam  GENERAL:  65 y.o.-year-old patient lying in the bed with no acute distress.  EYES: Pupils equal, round, reactive to light and accommodation. No scleral icterus. Extraocular muscles intact. Conjunctival pallor noted. HEENT: Head atraumatic, normocephalic. Oropharynx and nasopharynx clear.  NECK:  Supple, no jugular venous distention. No thyroid enlargement, no tenderness.  LUNGS: Normal breath sounds bilaterally, no  wheezing, rales,rhonchi or crepitation. No use of accessory muscles of respiration. Decreased bibasilar breath sounds. CARDIOVASCULAR: S1, S2 normal. No murmurs, rubs, or gallops.  ABDOMEN: Soft, nontender, nondistended. Bowel sounds present. No organomegaly or mass.  EXTREMITIES: 4+ edema in both legs upto thighs. No cyanosis, or clubbing.  NEUROLOGIC: Cranial nerves II through XII are intact. Muscle strength 5/5 in all extremities. Sensation intact. Gait not checked.  PSYCHIATRIC: The patient is alert and oriented x 3.  SKIN: No obvious rash, lesion, or ulcer.    LABORATORY PANEL:   CBC  Recent Labs Lab 08/31/15 0456  WBC 7.8  HGB 7.5*  HCT 22.5*  PLT 317   ------------------------------------------------------------------------------------------------------------------  Chemistries   Recent Labs Lab 08/31/15 0456  NA 137  K 4.2  CL 103  CO2 28  GLUCOSE 103*  BUN 60*  CREATININE 2.92*  CALCIUM 8.3*  AST 34  ALT 25  ALKPHOS 156*  BILITOT 3.8*   ------------------------------------------------------------------------------------------------------------------  Cardiac Enzymes No results for input(s): TROPONINI in the last 168 hours. ------------------------------------------------------------------------------------------------------------------  RADIOLOGY:  No results found.  EKG:   Orders placed or performed during the hospital encounter of 06/05/15  . ED EKG  . ED EKG  . EKG 12-Lead  . EKG 12-Lead  . EKG    ASSESSMENT AND PLAN:   65y/o female with past medical history significant for sickle cell anemia, transfusion dependent, hypertension, CK 80, atrial myxoma and congestive heart failure sent in from nephrology office secondary to worsening renal failure and anasarca  #1 anasarca-secondary to worsening renal failure, refractory to outpatient Diuril takes. -Started on Lasix drip. Continue Lasix drip for now. Monitor urine output. Some improvement  noted. -Being managed by nephrology  #2  acute renal failure with CKD stage IV-with hyperkalemia and metabolic acidosis. -Acute renal failure triggered by severe anemia and hypotension. -Appreciate nephrology consult. Continue sodium bicarbonate for acidosis for now. -No acute indication for hemodialysis at this time. Vascular consult discontinued -Continue to monitor renal function at this time. Hold ramipril  #3 acute on chronic anemia-due to sickle cell disease and anemia of chronic disease. -Baseline hemoglobin greater than 6.5 according to patient lately -received total 3 units PRBC in this admission - will call hematology consult. - as Hb is low today again- explained need for transfusion- she agreed to receive PRBC today.  #4 paroxysmal atrial fibrillation-continue amiodarone as history of wide-complex tachycardia.  #5 DVT prophylaxis-renally dosed Lovenox   Patient is ambulatory at this time. Likely no physical therapy needs at discharge.   All the records are reviewed and case discussed with Care Management/Social Workerr. Management plans discussed with the patient, family and they are in agreement.  CODE STATUS: Full Code  TOTAL TIME TAKING CARE OF THIS PATIENT: 35 minutes.   POSSIBLE D/C IN 1-2 DAYS, DEPENDING ON CLINICAL CONDITION.   Vaughan Basta M.D on 08/31/2015 at 9:06 AM  Between 7am to 6pm - Pager - [phone]  After 6pm go to [email or web address] - password [email or web address] [facility]  Office  [phone]  CC: Primary care physician; [name], MD

## 2015-09-01 LAB — RENAL FUNCTION PANEL
Albumin: 2.6 g/dL — ABNORMAL LOW (ref 3.5–5.0)
Anion gap: 6 (ref 5–15)
BUN: 63 mg/dL — ABNORMAL HIGH (ref 6–20)
CHLORIDE: 105 mmol/L (ref 101–111)
CO2: 26 mmol/L (ref 22–32)
Calcium: 8.3 mg/dL — ABNORMAL LOW (ref 8.9–10.3)
Creatinine, Ser: 3.05 mg/dL — ABNORMAL HIGH (ref 0.44–1.00)
GFR, EST AFRICAN AMERICAN: 18 mL/min — AB (ref >60)
GFR, EST NON AFRICAN AMERICAN: 15 mL/min — AB (ref >60)
Glucose, Bld: 91 mg/dL (ref 65–99)
POTASSIUM: 4.5 mmol/L (ref 3.5–5.1)
Phosphorus: 5.9 mg/dL — ABNORMAL HIGH (ref 2.5–4.6)
Sodium: 137 mmol/L (ref 135–145)

## 2015-09-01 LAB — GLUCOSE, CAPILLARY: Glucose-Capillary: 89 mg/dL (ref 65–99)

## 2015-09-01 LAB — TYPE AND SCREEN
ABO/RH(D): O POS
ANTIBODY SCREEN: NEGATIVE
UNIT DIVISION: 0
Unit division: 0
Unit division: 0

## 2015-09-01 LAB — HEPATIC FUNCTION PANEL
ALBUMIN: 2.6 g/dL — AB (ref 3.5–5.0)
ALK PHOS: 151 U/L — AB (ref 38–126)
ALT: 23 U/L (ref 14–54)
AST: 33 U/L (ref 15–41)
Bilirubin, Direct: 1.4 mg/dL — ABNORMAL HIGH (ref 0.1–0.5)
Indirect Bilirubin: 1.7 mg/dL — ABNORMAL HIGH (ref 0.3–0.9)
TOTAL PROTEIN: 5.6 g/dL — AB (ref 6.5–8.1)
Total Bilirubin: 3.1 mg/dL — ABNORMAL HIGH (ref 0.3–1.2)

## 2015-09-01 LAB — CBC
HEMATOCRIT: 22.4 % — AB (ref 35.0–47.0)
Hemoglobin: 7.2 g/dL — ABNORMAL LOW (ref 12.0–16.0)
MCH: 27.1 pg (ref 26.0–34.0)
MCHC: 32.2 g/dL (ref 32.0–36.0)
MCV: 84.3 fL (ref 80.0–100.0)
PLATELETS: 313 10*3/uL (ref 150–440)
RBC: 2.66 MIL/uL — ABNORMAL LOW (ref 3.80–5.20)
RDW: 18.1 % — AB (ref 11.5–14.5)
WBC: 7 10*3/uL (ref 3.6–11.0)

## 2015-09-01 LAB — HAPTOGLOBIN: Haptoglobin: 14 mg/dL — ABNORMAL LOW (ref 34–200)

## 2015-09-01 MED ORDER — FUROSEMIDE 80 MG PO TABS: 80.0000 mg | ORAL_TABLET | Freq: Two times a day (BID) | ORAL | Status: DC

## 2015-09-01 NOTE — Discharge Summary (Signed)
[place] at [place] NAME: Jane Short    MR#:  [id number]  DATE OF BIRTH:  [birth year]  DATE OF ADMISSION:  08/28/2015 ADMITTING PHYSICIAN: Lytle Butte, MD  DATE OF DISCHARGE: 09/01/2015  PRIMARY CARE PHYSICIAN: [name], MD    ADMISSION DIAGNOSIS:  Acute on Chronic Renal Failure  DISCHARGE DIAGNOSIS:  Active Problems:   Chronic progressive renal failure   SECONDARY DIAGNOSIS:   Past Medical History  Diagnosis Date  . Sickle cell anemia (HCC)     sickle cell disease  . Vitamin D deficiency   . Hypertension   . Osteoporosis 2011    osteopenia  . 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 Cheree Ditto at [facility]  . Gout   . Sickle cell anemia (HCC)   . Lichen planus   . 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 COPD (chronic obstructive pulmonary disease) (HCC)     symptoms Dr. Patricia Pesa   . CHF (congestive heart failure) ([place])   . Congestive heart failure (CHF) ([place])   . Congestive heart failure (CHF) ([place]) 06/2015    HOSPITAL COURSE:   #1 anasarca-secondary to worsening renal failure, refractory to outpatient Diuril takes. -Started on Lasix drip.  Monitor urine output. improvement noted. -Being managed by nephrology - IV lasix switched to oral after good diuresis. - advised to take 80 mg BID.  #2 acute renal failure with CKD stage IV-with hyperkalemia and metabolic acidosis. -Acute renal failure triggered by severe anemia and hypotension. -Appreciate nephrology consult. Continue sodium bicarbonate for acidosis for now. -No acute indication for hemodialysis at this time. Vascular consult discontinued -Continue to monitor renal function at this time. Hold ramipril  #3 acute on chronic anemia-due to sickle cell disease and anemia of chronic disease. -Baseline hemoglobin greater than 6.5 according to patient lately - received total 4 units PRBC in this admission - Apprecited hematology consult.  Ordered LDH and haptoglobin. - Follow in office next week.  #4 paroxysmal atrial fibrillation-continue amiodarone as history of wide-complex tachycardia.  #5 DVT prophylaxis-renally dosed Lovenox   DISCHARGE CONDITIONS:   Stable.  CONSULTS OBTAINED:  Treatment Team:  Lavonia Dana, MD Algernon Huxley, MD Cammie Sickle, MD  DRUG ALLERGIES:   Allergies  Allergen Reactions  . Ramipril     cough    DISCHARGE MEDICATIONS:   Current Discharge Medication List    CONTINUE these medications which have CHANGED   Details  furosemide (LASIX) 80 MG tablet Take 1 tablet (80 mg total) by mouth 2 (two) times daily. Qty: 30 tablet, Refills: 0      CONTINUE these medications which have NOT CHANGED   Details  albuterol (PROVENTIL HFA;VENTOLIN HFA) 108 (90 Base) MCG/ACT inhaler Inhale 2 puffs into the lungs every 4 (four) hours as needed for wheezing or shortness of breath. Qty: 1 Inhaler, Refills: 2   Associated Diagnoses: Chronic obstructive pulmonary disease, unspecified COPD type (HCC)    amiodarone (PACERONE) 200 MG tablet Take 200 mg by mouth daily.    calcitRIOL (ROCALTROL) 0.25 MCG capsule Take 0.25 mcg by mouth daily.    folic acid (FOLVITE) 1 MG tablet Take 1 mg by mouth daily.    mometasone-formoterol (DULERA) 200-5 MCG/ACT AERO Inhale 2 puffs into the lungs 2 (two) times daily. Qty: 1 Inhaler, Refills: 12   Associated Diagnoses: SOB (shortness of breath)    sodium bicarbonate 650 MG tablet Take by mouth.  STOP taking these medications     zaleplon (SONATA) 5 MG capsule          DISCHARGE INSTRUCTIONS:    Follow with nephrology and Hematology clinic.  If you experience worsening of your admission symptoms, develop shortness of breath, life threatening emergency, suicidal or homicidal thoughts you must seek medical attention immediately by calling 911 or calling your MD immediately  if symptoms less severe.  You Must read complete  instructions/literature along with all the possible adverse reactions/side effects for all the Medicines you take and that have been prescribed to you. Take any new Medicines after you have completely understood and accept all the possible adverse reactions/side effects.   Please note  You were cared for by a hospitalist during your hospital stay. If you have any questions about your discharge medications or the care you received while you were in the hospital after you are discharged, you can call the unit and asked to speak with the hospitalist on call if the hospitalist that took care of you is not available. Once you are discharged, your primary care physician will handle any further medical issues. Please note that NO REFILLS for any discharge medications will be authorized once you are discharged, as it is imperative that you return to your primary care physician (or establish a relationship with a primary care physician if you do not have one) for your aftercare needs so that they can reassess your need for medications and monitor your lab values.    Today   CHIEF COMPLAINT:  No chief complaint on file.   HISTORY OF PRESENT ILLNESS:  Jane Short  is a 65 y.o. female with a known history of present illness and anemia, hypertension, atrial myxoma, chronic renal failure and from Dr.Kolluru 's office because of worsening pedal edema, weight gain. Patient will be on IV Lasix drip to see if a improves. Otherwise patient will need to be on dialysis. Patient is shortness of breath or chest pain. She was on torsemide 7 days as ordered by the cardiology but now she is on Lasix 80 mg by mouth on the morning and 40 mg at night.  VITAL SIGNS:  Blood pressure 112/52, pulse 79, temperature 97.9 F (36.6 C), temperature source Oral, resp. rate 14, height 5\' 7"  (1.702 m), weight 84.142 kg (185 lb 8 oz), SpO2 96 %.  I/O:   Intake/Output Summary (Last 24 hours) at 09/01/15 1235 Last data filed at  09/01/15 1137  Gross per 24 hour  Intake    920 ml  Output   3250 ml  Net  -2330 ml    PHYSICAL EXAMINATION:   GENERAL: 65 y.o.-year-old patient lying in the bed with no acute distress.  EYES: Pupils equal, round, reactive to light and accommodation. No scleral icterus. Extraocular muscles intact. Conjunctival pallor noted. HEENT: Head atraumatic, normocephalic. Oropharynx and nasopharynx clear.  NECK: Supple, no jugular venous distention. No thyroid enlargement, no tenderness.  LUNGS: Normal breath sounds bilaterally, no wheezing, rales,rhonchi or crepitation. No use of accessory muscles of respiration. Decreased bibasilar breath sounds. CARDIOVASCULAR: S1, S2 normal. No murmurs, rubs, or gallops.  ABDOMEN: Soft, nontender, nondistended. Bowel sounds present. No organomegaly or mass.  EXTREMITIES: some edema in both legs . No cyanosis, or clubbing.  NEUROLOGIC: Cranial nerves II through XII are intact. Muscle strength 5/5 in all extremities. Sensation intact. Gait not checked.  PSYCHIATRIC: The patient is alert and oriented x 3.  SKIN: No obvious rash, lesion, or ulcer.  DATA REVIEW:   CBC  Recent Labs Lab 09/01/15 0631  WBC 7.0  HGB 7.2*  HCT 22.4*  PLT 313    Chemistries   Recent Labs Lab 09/01/15 0631  NA 137  K 4.5  CL 105  CO2 26  GLUCOSE 91  BUN 63*  CREATININE 3.05*  CALCIUM 8.3*  AST 33  ALT 23  ALKPHOS 151*  BILITOT 3.1*    Cardiac Enzymes No results for input(s): TROPONINI in the last 168 hours.  Microbiology Results  Results for orders placed or performed during the hospital encounter of 06/05/15  Body fluid culture     Status: None   Collection Time: 06/18/15  2:35 PM  Result Value Ref Range Status   Specimen Description ABDOMEN  Final   Special Requests NONE  Final   Gram Stain FEW WBC SEEN NO ORGANISMS SEEN   Final   Culture No growth aerobically or anaerobically.  Final   Report Status 06/22/2015 FINAL  Final  C difficile  quick scan w PCR reflex     Status: None   Collection Time: 06/21/15  1:00 AM  Result Value Ref Range Status   C Diff antigen NEGATIVE NEGATIVE Final   C Diff toxin NEGATIVE NEGATIVE Final   C Diff interpretation Negative for C. difficile  Final  CULTURE, BLOOD (ROUTINE X 2) w Reflex to PCR ID Panel     Status: None   Collection Time: 06/22/15 12:10 PM  Result Value Ref Range Status   Specimen Description BLOOD LEFT ANTECUBITAL  Final   Special Requests BOTTLES DRAWN AEROBIC AND ANAEROBIC  3CC  Final   Culture  Setup Time   Final    GRAM POSITIVE COCCI IN BOTH AEROBIC AND ANAEROBIC BOTTLES CRITICAL RESULT CALLED TO, READ BACK BY AND VERIFIED WITH: NATE COOKSON AT [place] 06/23/15.PMH CONFIRMED BY [facility]    Culture   Final    ENTEROCOCCUS FAECALIS IN BOTH AEROBIC AND ANAEROBIC BOTTLES    Report Status 06/25/2015 FINAL  Final   Organism ID, Bacteria ENTEROCOCCUS FAECALIS  Final      Susceptibility   Enterococcus faecalis - MIC*    AMPICILLIN <=2 SENSITIVE Sensitive     LINEZOLID 2 SENSITIVE Sensitive     VANCOMYCIN Value in next row Sensitive      SENSITIVE1    GENTAMICIN SYNERGY Value in next row Sensitive      SENSITIVE1    * ENTEROCOCCUS FAECALIS  CULTURE, BLOOD (ROUTINE X 2) w Reflex to PCR ID Panel     Status: None   Collection Time: 06/22/15 12:21 PM  Result Value Ref Range Status   Specimen Description BLOOD RIGHT ARM  Final   Special Requests BOTTLES DRAWN AEROBIC AND ANAEROBIC  [place]  Final   Culture  Setup Time   Final    GRAM POSITIVE COCCI IN BOTH AEROBIC AND ANAEROBIC BOTTLES CRITICAL RESULT CALLED TO, READ BACK BY AND VERIFIED WITH: NATE COOKSON AT [place] 06/23/15.PMH CONFIRMED BY RWW    Culture   Final    ENTEROCOCCUS FAECALIS IN BOTH AEROBIC AND ANAEROBIC BOTTLES    Report Status 06/25/2015 FINAL  Final   Organism ID, Bacteria ENTEROCOCCUS FAECALIS  Final      Susceptibility   Enterococcus faecalis - MIC*    AMPICILLIN <=2 SENSITIVE Sensitive     LINEZOLID 2  SENSITIVE Sensitive     VANCOMYCIN Value in next row Sensitive      SENSITIVE1    GENTAMICIN SYNERGY Value in next row  Sensitive      SENSITIVE1    * ENTEROCOCCUS FAECALIS  Blood Culture ID Panel (Reflexed)     Status: Abnormal   Collection Time: 06/22/15 12:21 PM  Result Value Ref Range Status   Enterococcus species DETECTED (A) NOT DETECTED Final    Comment: CRITICAL RESULT CALLED TO, READ BACK BY AND VERIFIED WITH: NATE COOKSON AT 0330 06/23/15.PMH    Listeria monocytogenes NOT DETECTED NOT DETECTED Final   Staphylococcus species NOT DETECTED NOT DETECTED Final   Staphylococcus aureus NOT DETECTED NOT DETECTED Final   Streptococcus species NOT DETECTED NOT DETECTED Final   Streptococcus agalactiae NOT DETECTED NOT DETECTED Final   Streptococcus pneumoniae NOT DETECTED NOT DETECTED Final   Streptococcus pyogenes NOT DETECTED NOT DETECTED Final   Acinetobacter baumannii NOT DETECTED NOT DETECTED Final   Enterobacteriaceae species NOT DETECTED NOT DETECTED Final   Enterobacter cloacae complex NOT DETECTED NOT DETECTED Final   Escherichia coli NOT DETECTED NOT DETECTED Final   Klebsiella oxytoca NOT DETECTED NOT DETECTED Final   Klebsiella pneumoniae NOT DETECTED NOT DETECTED Final   Proteus species NOT DETECTED NOT DETECTED Final   Serratia marcescens NOT DETECTED NOT DETECTED Final   Haemophilus influenzae NOT DETECTED NOT DETECTED Final   Neisseria meningitidis NOT DETECTED NOT DETECTED Final   Pseudomonas aeruginosa NOT DETECTED NOT DETECTED Final   Candida albicans NOT DETECTED NOT DETECTED Final   Candida glabrata NOT DETECTED NOT DETECTED Final   Candida krusei NOT DETECTED NOT DETECTED Final   Candida parapsilosis NOT DETECTED NOT DETECTED Final   Candida tropicalis NOT DETECTED NOT DETECTED Final   Carbapenem resistance NOT DETECTED NOT DETECTED Final   Methicillin resistance NOT DETECTED NOT DETECTED Final   Vancomycin resistance NOT DETECTED NOT DETECTED Final     RADIOLOGY:  No results found.   Management plans discussed with the patient, family and they are in agreement.  CODE STATUS: [place]Ull    Code Status Orders        Start     Ordered   08/28/15 1046  Full code   Continuous     08/28/15 1049    Code Status History    Date Active Date Inactive Code Status Order ID Comments User Context   06/05/2015 11:05 AM 06/25/2015  4:27 AM Full Code [id number]  Gladstone Lighter, MD Inpatient      TOTAL TIME TAKING CARE OF THIS PATIENT: 35 minutes.    Vaughan Basta M.D on 09/01/2015 at 12:35 PM  Between 7am to 6pm - Pager - [phone]  After 6pm go to [email or web address] - password [email or web address] [facility]  Office  [phone]  CC: Primary care physician; [name], MD   Note: This dictation was prepared with Dragon dictation along with smaller phrase technology. Any transcriptional errors that result from this process are unintentional.

## 2015-09-01 NOTE — Progress Notes (Signed)
[facility]  ROUNDING NOTE   Subjective:   Furosemide 80mg  PO BID. Patient walking around room.  UOP 3150 (4550) (3200)   Creatinine 3.05 (2.92) (2.97)  Objective:  Vital signs in last 24 hours:  Temp:  [97.9 F (36.6 C)] 97.9 F (36.6 C) (04/02 0526) Pulse Rate:  [79-83] 79 (04/02 0526) Resp:  [14-17] 14 (04/02 0526) BP: (110-118)/(52-65) 112/52 mmHg (04/02 0526) SpO2:  [96 %-97 %] 96 % (04/02 0526) Weight:  [84.142 kg (185 lb 8 oz)] 84.142 kg (185 lb 8 oz) (04/02 0526)  Weight change: -1.542 kg (-3 lb 6.4 oz) Filed Weights   08/30/15 0300 08/31/15 0500 09/01/15 0526  Weight: 72.394 kg (159 lb 9.6 oz) 85.684 kg (188 lb 14.4 oz) 84.142 kg (185 lb 8 oz)    Intake/Output: I/O last 3 completed shifts: In: 1194.9 [P.O.:920; I.V.:274.9] Out: 5350 [Urine:5350]   Intake/Output this shift:     Physical Exam: General: NAD, laying in bed  Head: Normocephalic, atraumatic. Moist oral mucosal membranes  Eyes: Anicteric, PERRL  Neck: Supple, trachea midline  Lungs:  clear  Heart: Regular rate and rhythm  Abdomen:  Soft, nontender,   Extremities: + peripheral edema.  Neurologic: Nonfocal, moving all four extremities  Skin: No lesions       Basic Metabolic Panel:  Recent Labs Lab 08/28/15 1216 08/29/15 0420 08/30/15 0409 08/31/15 0456 09/01/15 0631  NA 136 136 136 137 137  K 3.8 3.8 4.1 4.2 4.5  CL 104 103 103 103 105  CO2 24 25 24 28 26   GLUCOSE 95 127* 102* 103* 91  BUN 60* 56* 60* 60* 63*  CREATININE 2.78* 2.66* 2.97* 2.92* 3.05*  CALCIUM 8.0* 7.9* 8.2* 8.3* 8.3*  PHOS  --   --   --   --  5.9*    Liver Function Tests:  Recent Labs Lab 08/31/15 0456 09/01/15 0631  AST 34 33  ALT 25 23  ALKPHOS 156* 151*  BILITOT 3.8* 3.1*  PROT 5.6* 5.6*  ALBUMIN 2.7* 2.6*  2.6*   No results for input(s): LIPASE, AMYLASE in the last 168 hours. No results for input(s): AMMONIA in the last 168 hours.  CBC:  Recent Labs Lab 08/28/15 1216   08/29/15 0420 08/29/15 1637 08/30/15 0409 08/31/15 0456 09/01/15 0631  WBC 9.7  --  9.4  --  8.5 7.8 7.0  HGB 5.5*  < > 6.1* 7.2* 6.6* 7.5* 7.2*  HCT 17.1*  < > 18.6* 21.7* 20.2* 22.5* 22.4*  MCV 84.8  --  83.7  --  83.5 85.1 84.3  PLT 279  --  261  --  279 317 313  < > = values in this interval not displayed.  Cardiac Enzymes: No results for input(s): CKTOTAL, CKMB, CKMBINDEX, TROPONINI in the last 168 hours.  BNP: Invalid input(s): POCBNP  CBG:  Recent Labs Lab 08/29/15 0805 08/30/15 0806 08/31/15 0746 09/01/15 0724  GLUCAP 95 83 97 89    Microbiology: Results for orders placed or performed during the hospital encounter of 06/05/15  Body fluid culture     Status: None   Collection Time: 06/18/15  2:35 PM  Result Value Ref Range Status   Specimen Description ABDOMEN  Final   Special Requests NONE  Final   Gram Stain FEW WBC SEEN NO ORGANISMS SEEN   Final   Culture No growth aerobically or anaerobically.  Final   Report Status 06/22/2015 FINAL  Final  C difficile quick scan w PCR reflex  Status: None   Collection Time: 06/21/15  1:00 AM  Result Value Ref Range Status   C Diff antigen NEGATIVE NEGATIVE Final   C Diff toxin NEGATIVE NEGATIVE Final   C Diff interpretation Negative for C. difficile  Final  CULTURE, BLOOD (ROUTINE X 2) w Reflex to PCR ID Panel     Status: None   Collection Time: 06/22/15 12:10 PM  Result Value Ref Range Status   Specimen Description BLOOD LEFT ANTECUBITAL  Final   Special Requests BOTTLES DRAWN AEROBIC AND ANAEROBIC  3CC  Final   Culture  Setup Time   Final    GRAM POSITIVE COCCI IN BOTH AEROBIC AND ANAEROBIC BOTTLES CRITICAL RESULT CALLED TO, READ BACK BY AND VERIFIED WITH: NATE COOKSON AT [place] 06/23/15.PMH CONFIRMED BY RWW    Culture   Final    ENTEROCOCCUS FAECALIS IN BOTH AEROBIC AND ANAEROBIC BOTTLES    Report Status 06/25/2015 FINAL  Final   Organism ID, Bacteria ENTEROCOCCUS FAECALIS  Final      Susceptibility    Enterococcus faecalis - MIC*    AMPICILLIN <=2 SENSITIVE Sensitive     LINEZOLID 2 SENSITIVE Sensitive     VANCOMYCIN Value in next row Sensitive      SENSITIVE1    GENTAMICIN SYNERGY Value in next row Sensitive      SENSITIVE1    * ENTEROCOCCUS FAECALIS  CULTURE, BLOOD (ROUTINE X 2) w Reflex to PCR ID Panel     Status: None   Collection Time: 06/22/15 12:21 PM  Result Value Ref Range Status   Specimen Description BLOOD RIGHT ARM  Final   Special Requests BOTTLES DRAWN AEROBIC AND ANAEROBIC  [place]  Final   Culture  Setup Time   Final    GRAM POSITIVE COCCI IN BOTH AEROBIC AND ANAEROBIC BOTTLES CRITICAL RESULT CALLED TO, READ BACK BY AND VERIFIED WITH: NATE COOKSON AT [place] 06/23/15.PMH CONFIRMED BY RWW    Culture   Final    ENTEROCOCCUS FAECALIS IN BOTH AEROBIC AND ANAEROBIC BOTTLES    Report Status 06/25/2015 FINAL  Final   Organism ID, Bacteria ENTEROCOCCUS FAECALIS  Final      Susceptibility   Enterococcus faecalis - MIC*    AMPICILLIN <=2 SENSITIVE Sensitive     LINEZOLID 2 SENSITIVE Sensitive     VANCOMYCIN Value in next row Sensitive      SENSITIVE1    GENTAMICIN SYNERGY Value in next row Sensitive      SENSITIVE1    * ENTEROCOCCUS FAECALIS  Blood Culture ID Panel (Reflexed)     Status: Abnormal   Collection Time: 06/22/15 12:21 PM  Result Value Ref Range Status   Enterococcus species DETECTED (A) NOT DETECTED Final    Comment: CRITICAL RESULT CALLED TO, READ BACK BY AND VERIFIED WITH: NATE COOKSON AT 0330 06/23/15.PMH    Listeria monocytogenes NOT DETECTED NOT DETECTED Final   Staphylococcus species NOT DETECTED NOT DETECTED Final   Staphylococcus aureus NOT DETECTED NOT DETECTED Final   Streptococcus species NOT DETECTED NOT DETECTED Final   Streptococcus agalactiae NOT DETECTED NOT DETECTED Final   Streptococcus pneumoniae NOT DETECTED NOT DETECTED Final   Streptococcus pyogenes NOT DETECTED NOT DETECTED Final   Acinetobacter baumannii NOT DETECTED NOT DETECTED  Final   Enterobacteriaceae species NOT DETECTED NOT DETECTED Final   Enterobacter cloacae complex NOT DETECTED NOT DETECTED Final   Escherichia coli NOT DETECTED NOT DETECTED Final   Klebsiella oxytoca NOT DETECTED NOT DETECTED Final   Klebsiella pneumoniae NOT DETECTED  NOT DETECTED Final   Proteus species NOT DETECTED NOT DETECTED Final   Serratia marcescens NOT DETECTED NOT DETECTED Final   Haemophilus influenzae NOT DETECTED NOT DETECTED Final   Neisseria meningitidis NOT DETECTED NOT DETECTED Final   Pseudomonas aeruginosa NOT DETECTED NOT DETECTED Final   Candida albicans NOT DETECTED NOT DETECTED Final   Candida glabrata NOT DETECTED NOT DETECTED Final   Candida krusei NOT DETECTED NOT DETECTED Final   Candida parapsilosis NOT DETECTED NOT DETECTED Final   Candida tropicalis NOT DETECTED NOT DETECTED Final   Carbapenem resistance NOT DETECTED NOT DETECTED Final   Methicillin resistance NOT DETECTED NOT DETECTED Final   Vancomycin resistance NOT DETECTED NOT DETECTED Final    Coagulation Studies: No results for input(s): LABPROT, INR in the last 72 hours.  Urinalysis: No results for input(s): COLORURINE, LABSPEC, PHURINE, GLUCOSEU, HGBUR, BILIRUBINUR, KETONESUR, PROTEINUR, UROBILINOGEN, NITRITE, LEUKOCYTESUR in the last 72 hours.  Invalid input(s): APPERANCEUR    Imaging: No results found.   Medications:     . amiodarone  200 mg Oral Daily  . calcitRIOL  0.25 mcg Oral Daily  . docusate sodium  100 mg Oral BID  . enoxaparin (LOVENOX) injection  30 mg Subcutaneous Q24H  . folic acid  1 mg Oral Daily  . furosemide  80 mg Oral BID  . mometasone-formoterol  2 puff Inhalation BID  . sodium bicarbonate  650 mg Oral Daily   acetaminophen **OR** acetaminophen, albuterol, bisacodyl, HYDROcodone-acetaminophen, ondansetron **OR** ondansetron (ZOFRAN) IV, zolpidem  Assessment/ Plan:   Ms. Corcoran is a 65 year old black female with sickle cell disease, atrial myxoma who  presents 08/28/2015 for Acute on Chronic Renal Failure   1. Acute Renal Failure on Chronic Kidney Disease stage IV with hyperkalemia and metabolic acidosis: Creatinine, potassium and CO2 now back to baseline.  Acute renal failure due to severe anemia, acute congestive heart failure and hypotension. Now has failed outpatient diuretic therapy.  - Restarted 80mg  PO twice a day.  Chronic Kidney Disease is secondary to NSAID use and sickle cell anemia.  - Continue sodium bicarbonate for metabolic acidosis.  - Currently off ramipril - hold for acute renal failure, advanced renal disease and hyperkalemia   2. 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   3. Hypertension: low. Holding home agents  4. Secondary Hyperparathyroidism: PTH 197. Calcium and phosphorus at goal.  - Continue calcitriol   LOS: [place], [place] 4/2/20179:11 AM

## 2015-09-01 NOTE — Discharge Instructions (Signed)
Take medications as prescribed.  Please contact your doctor if you should experience nausea/vomiting, increased weakness or feeling more tired than usual, fever and if you should have any questions or concerns.

## 2015-09-02 ENCOUNTER — Telehealth: Payer: Self-pay | Admitting: *Deleted

## 2015-09-02 ENCOUNTER — Telehealth: Payer: Self-pay | Admitting: Internal Medicine

## 2015-09-02 DIAGNOSIS — J449 Chronic obstructive pulmonary disease, unspecified: Secondary | ICD-10-CM

## 2015-09-02 NOTE — Telephone Encounter (Signed)
[name] spoke with patient and gave results of ONO, then placed order for [facility] to supply o2.  Pt voiced understanding when [name] called and had no further questions.  [name] with [employer] called patient to set up delivery of o2 and pt refused to have O2 delivered to home.  Per [name] with [employer], pt stated, "that she was getting ready to move to [place] and if she felt like she needed it when she got there, that she would check into obtaining o2 in [place]". Pt advised [name] with [employer], "not to bring out home o2 as she was refusing it".  Just FYI pt refused o2 at night. [name]

## 2015-09-02 NOTE — Telephone Encounter (Signed)
Pt informed of ONO results. New o2 order placed.

## 2015-09-04 ENCOUNTER — Ambulatory Visit: Payer: BLUE CROSS/BLUE SHIELD | Attending: Pulmonary Disease

## 2015-09-04 ENCOUNTER — Inpatient Hospital Stay: Payer: BLUE CROSS/BLUE SHIELD | Attending: Internal Medicine

## 2015-09-04 ENCOUNTER — Telehealth: Payer: Self-pay | Admitting: Internal Medicine

## 2015-09-04 ENCOUNTER — Inpatient Hospital Stay: Payer: BLUE CROSS/BLUE SHIELD

## 2015-09-04 VITALS — BP 105/61 | HR 91 | Resp 18

## 2015-09-04 DIAGNOSIS — I10 Essential (primary) hypertension: Secondary | ICD-10-CM | POA: Diagnosis not present

## 2015-09-04 DIAGNOSIS — D631 Anemia in chronic kidney disease: Secondary | ICD-10-CM | POA: Diagnosis not present

## 2015-09-04 DIAGNOSIS — N189 Chronic kidney disease, unspecified: Principal | ICD-10-CM

## 2015-09-04 DIAGNOSIS — E559 Vitamin D deficiency, unspecified: Secondary | ICD-10-CM | POA: Insufficient documentation

## 2015-09-04 DIAGNOSIS — D57219 Sickle-cell/Hb-C disease with crisis, unspecified: Secondary | ICD-10-CM

## 2015-09-04 DIAGNOSIS — I129 Hypertensive chronic kidney disease with stage 1 through stage 4 chronic kidney disease, or unspecified chronic kidney disease: Secondary | ICD-10-CM | POA: Diagnosis present

## 2015-09-04 DIAGNOSIS — Z87891 Personal history of nicotine dependence: Secondary | ICD-10-CM | POA: Diagnosis not present

## 2015-09-04 DIAGNOSIS — N183 Chronic kidney disease, stage 3 unspecified: Secondary | ICD-10-CM

## 2015-09-04 DIAGNOSIS — G4761 Periodic limb movement disorder: Secondary | ICD-10-CM | POA: Insufficient documentation

## 2015-09-04 DIAGNOSIS — G4719 Other hypersomnia: Secondary | ICD-10-CM | POA: Diagnosis present

## 2015-09-04 DIAGNOSIS — D151 Benign neoplasm of heart: Secondary | ICD-10-CM | POA: Insufficient documentation

## 2015-09-04 DIAGNOSIS — K769 Liver disease, unspecified: Secondary | ICD-10-CM | POA: Insufficient documentation

## 2015-09-04 DIAGNOSIS — M25532 Pain in left wrist: Secondary | ICD-10-CM | POA: Insufficient documentation

## 2015-09-04 DIAGNOSIS — I4891 Unspecified atrial fibrillation: Secondary | ICD-10-CM | POA: Insufficient documentation

## 2015-09-04 DIAGNOSIS — J449 Chronic obstructive pulmonary disease, unspecified: Secondary | ICD-10-CM | POA: Diagnosis not present

## 2015-09-04 DIAGNOSIS — M25531 Pain in right wrist: Secondary | ICD-10-CM | POA: Diagnosis not present

## 2015-09-04 DIAGNOSIS — M818 Other osteoporosis without current pathological fracture: Secondary | ICD-10-CM | POA: Insufficient documentation

## 2015-09-04 DIAGNOSIS — D571 Sickle-cell disease without crisis: Secondary | ICD-10-CM | POA: Diagnosis not present

## 2015-09-04 DIAGNOSIS — I509 Heart failure, unspecified: Secondary | ICD-10-CM | POA: Insufficient documentation

## 2015-09-04 DIAGNOSIS — Z79899 Other long term (current) drug therapy: Secondary | ICD-10-CM | POA: Diagnosis not present

## 2015-09-04 DIAGNOSIS — G4733 Obstructive sleep apnea (adult) (pediatric): Secondary | ICD-10-CM | POA: Diagnosis not present

## 2015-09-04 LAB — HEMOGLOBIN: HEMOGLOBIN: 6.9 g/dL — AB (ref 12.0–16.0)

## 2015-09-04 LAB — SAMPLE TO BLOOD BANK

## 2015-09-04 LAB — HEMATOCRIT: HCT: 20.5 % — ABNORMAL LOW (ref 35.0–47.0)

## 2015-09-04 MED ORDER — EPOETIN ALFA 40000 UNIT/ML IJ SOLN
40000.0000 [IU] | Freq: Once | INTRAMUSCULAR | Status: AC
  Administered 2015-09-04: 40000 [IU] via SUBCUTANEOUS
  Filled 2015-09-04: qty 1

## 2015-09-04 NOTE — Telephone Encounter (Signed)
Pt came into office this afternoon. She would like something for her wrists and hands. Pt says her hands hurt and are stiff, mostly around the thumb area. There is some swelling. Please call pt at home.

## 2015-09-05 NOTE — Telephone Encounter (Signed)
She should be seen. Given her recent anemia and other issues, I would not recommend OTC ibuprofen. She needs to be seen.

## 2015-09-05 NOTE — Telephone Encounter (Signed)
Tried to call patient back. No answer or VM

## 2015-09-05 NOTE — Telephone Encounter (Signed)
Patient returned your call.

## 2015-09-05 NOTE — Telephone Encounter (Signed)
Spoke with patient and she would like something called in for inflammation.

## 2015-09-05 NOTE — Telephone Encounter (Signed)
LM for patient to return call.

## 2015-09-06 DIAGNOSIS — G4733 Obstructive sleep apnea (adult) (pediatric): Secondary | ICD-10-CM | POA: Diagnosis not present

## 2015-09-09 NOTE — Telephone Encounter (Signed)
Not able to get in touch with patient.

## 2015-09-11 ENCOUNTER — Telehealth: Payer: Self-pay | Admitting: Pharmacist

## 2015-09-11 ENCOUNTER — Other Ambulatory Visit: Payer: Self-pay | Admitting: *Deleted

## 2015-09-11 ENCOUNTER — Telehealth: Payer: Self-pay | Admitting: *Deleted

## 2015-09-11 ENCOUNTER — Inpatient Hospital Stay: Payer: BLUE CROSS/BLUE SHIELD

## 2015-09-11 ENCOUNTER — Inpatient Hospital Stay (HOSPITAL_BASED_OUTPATIENT_CLINIC_OR_DEPARTMENT_OTHER): Payer: BLUE CROSS/BLUE SHIELD | Admitting: Internal Medicine

## 2015-09-11 ENCOUNTER — Other Ambulatory Visit: Payer: Self-pay | Admitting: Internal Medicine

## 2015-09-11 VITALS — BP 121/65 | HR 103 | Temp 98.2°F | Resp 18 | Wt 179.0 lb

## 2015-09-11 VITALS — BP 102/57 | HR 98 | Temp 98.7°F | Resp 18

## 2015-09-11 DIAGNOSIS — D631 Anemia in chronic kidney disease: Secondary | ICD-10-CM

## 2015-09-11 DIAGNOSIS — I4891 Unspecified atrial fibrillation: Secondary | ICD-10-CM

## 2015-09-11 DIAGNOSIS — I10 Essential (primary) hypertension: Secondary | ICD-10-CM

## 2015-09-11 DIAGNOSIS — D509 Iron deficiency anemia, unspecified: Secondary | ICD-10-CM

## 2015-09-11 DIAGNOSIS — M818 Other osteoporosis without current pathological fracture: Secondary | ICD-10-CM

## 2015-09-11 DIAGNOSIS — D151 Benign neoplasm of heart: Secondary | ICD-10-CM

## 2015-09-11 DIAGNOSIS — D571 Sickle-cell disease without crisis: Secondary | ICD-10-CM

## 2015-09-11 DIAGNOSIS — N183 Chronic kidney disease, stage 3 unspecified: Secondary | ICD-10-CM

## 2015-09-11 DIAGNOSIS — N189 Chronic kidney disease, unspecified: Secondary | ICD-10-CM

## 2015-09-11 DIAGNOSIS — Z87891 Personal history of nicotine dependence: Secondary | ICD-10-CM

## 2015-09-11 DIAGNOSIS — J449 Chronic obstructive pulmonary disease, unspecified: Secondary | ICD-10-CM

## 2015-09-11 DIAGNOSIS — M25532 Pain in left wrist: Secondary | ICD-10-CM

## 2015-09-11 DIAGNOSIS — I129 Hypertensive chronic kidney disease with stage 1 through stage 4 chronic kidney disease, or unspecified chronic kidney disease: Secondary | ICD-10-CM | POA: Diagnosis not present

## 2015-09-11 DIAGNOSIS — Z79899 Other long term (current) drug therapy: Secondary | ICD-10-CM | POA: Diagnosis not present

## 2015-09-11 DIAGNOSIS — E559 Vitamin D deficiency, unspecified: Secondary | ICD-10-CM

## 2015-09-11 DIAGNOSIS — M25531 Pain in right wrist: Secondary | ICD-10-CM

## 2015-09-11 DIAGNOSIS — K769 Liver disease, unspecified: Secondary | ICD-10-CM

## 2015-09-11 DIAGNOSIS — D57219 Sickle-cell/Hb-C disease with crisis, unspecified: Secondary | ICD-10-CM

## 2015-09-11 DIAGNOSIS — L03011 Cellulitis of right finger: Secondary | ICD-10-CM

## 2015-09-11 DIAGNOSIS — I509 Heart failure, unspecified: Secondary | ICD-10-CM

## 2015-09-11 LAB — COMPREHENSIVE METABOLIC PANEL
ALT: 23 U/L (ref 14–54)
ANION GAP: 8 (ref 5–15)
AST: 37 U/L (ref 15–41)
Albumin: 2.9 g/dL — ABNORMAL LOW (ref 3.5–5.0)
Alkaline Phosphatase: 156 U/L — ABNORMAL HIGH (ref 38–126)
BUN: 57 mg/dL — ABNORMAL HIGH (ref 6–20)
CO2: 24 mmol/L (ref 22–32)
Calcium: 8.3 mg/dL — ABNORMAL LOW (ref 8.9–10.3)
Chloride: 104 mmol/L (ref 101–111)
Creatinine, Ser: 2.9 mg/dL — ABNORMAL HIGH (ref 0.44–1.00)
GFR calc non Af Amer: 16 mL/min — ABNORMAL LOW (ref >60)
GFR, EST AFRICAN AMERICAN: 19 mL/min — AB (ref >60)
GLUCOSE: 124 mg/dL — AB (ref 65–99)
POTASSIUM: 3.8 mmol/L (ref 3.5–5.1)
SODIUM: 136 mmol/L (ref 135–145)
TOTAL PROTEIN: 6.1 g/dL — AB (ref 6.5–8.1)
Total Bilirubin: 2.9 mg/dL — ABNORMAL HIGH (ref 0.3–1.2)

## 2015-09-11 LAB — CBC WITH DIFFERENTIAL/PLATELET
BASOS ABS: 0.1 10*3/uL (ref 0–0.1)
Basophils Relative: 1 %
EOS PCT: 4 %
Eosinophils Absolute: 0.5 10*3/uL (ref 0–0.7)
HCT: 14.4 % — CL (ref 35.0–47.0)
HEMOGLOBIN: 4.8 g/dL — AB (ref 12.0–16.0)
LYMPHS ABS: 1.6 10*3/uL (ref 1.0–3.6)
LYMPHS PCT: 14 %
MCH: 28.5 pg (ref 26.0–34.0)
MCHC: 33.6 g/dL (ref 32.0–36.0)
MCV: 84.9 fL (ref 80.0–100.0)
MONOS PCT: 9 %
Monocytes Absolute: 1.1 10*3/uL — ABNORMAL HIGH (ref 0.2–0.9)
NEUTROS PCT: 72 %
Neutro Abs: 8.4 10*3/uL — ABNORMAL HIGH (ref 1.4–6.5)
PLATELETS: 352 10*3/uL (ref 150–440)
RBC: 1.69 MIL/uL — AB (ref 3.80–5.20)
RDW: 20.5 % — ABNORMAL HIGH (ref 11.5–14.5)
WBC: 11.7 10*3/uL — AB (ref 3.6–11.0)
nRBC: 5 /100 WBC — ABNORMAL HIGH

## 2015-09-11 LAB — SAMPLE TO BLOOD BANK

## 2015-09-11 LAB — FERRITIN: FERRITIN: 64 ng/mL (ref 11–307)

## 2015-09-11 LAB — IRON AND TIBC
IRON: 27 ug/dL — AB (ref 28–170)
Saturation Ratios: 6 % — ABNORMAL LOW (ref 10.4–31.8)
TIBC: 430 ug/dL (ref 250–450)
UIBC: 403 ug/dL

## 2015-09-11 LAB — PREPARE RBC (CROSSMATCH)

## 2015-09-11 MED ORDER — ACETAMINOPHEN 325 MG PO TABS
650.0000 mg | ORAL_TABLET | Freq: Once | ORAL | Status: AC
Start: 2015-09-11 — End: 2015-09-11
  Administered 2015-09-11: 650 mg via ORAL
  Filled 2015-09-11: qty 2

## 2015-09-11 MED ORDER — EPOETIN ALFA 40000 UNIT/ML IJ SOLN
40000.0000 [IU] | Freq: Once | INTRAMUSCULAR | Status: AC
  Administered 2015-09-11: 40000 [IU] via SUBCUTANEOUS
  Filled 2015-09-11: qty 1

## 2015-09-11 MED ORDER — DIPHENHYDRAMINE HCL 25 MG PO CAPS
25.0000 mg | ORAL_CAPSULE | Freq: Once | ORAL | Status: AC
  Administered 2015-09-11: 25 mg via ORAL
  Filled 2015-09-11: qty 1

## 2015-09-11 MED ORDER — SODIUM CHLORIDE 0.9 % IV SOLN
250.0000 mL | Freq: Once | INTRAVENOUS | Status: AC
  Administered 2015-09-11: 250 mL via INTRAVENOUS
  Filled 2015-09-11: qty 250

## 2015-09-11 MED ORDER — FUROSEMIDE 10 MG/ML IJ SOLN
20.0000 mg | Freq: Once | INTRAMUSCULAR | Status: AC
  Administered 2015-09-11: 20 mg via INTRAVENOUS
  Filled 2015-09-11: qty 2

## 2015-09-11 MED ORDER — CEPHALEXIN 500 MG PO CAPS: 500.0000 mg | ORAL_CAPSULE | Freq: Three times a day (TID) | ORAL | Status: DC

## 2015-09-11 NOTE — Telephone Encounter (Signed)
Critical Labs - Hgb. 4.8, Hct. 14.4  MD notified.

## 2015-09-11 NOTE — Progress Notes (Signed)
[facility] OFFICE PROGRESS NOTE  Patient Care Team: Jackolyn Confer, MD as PCP - General (Internal Medicine) Seeplaputhur Robinette Haines, MD (General Surgery) Rubbie Battiest, NP as Nurse Practitioner (Gerontology)   SUMMARY OF ONCOLOGIC HISTORY:  # Severe Anemia- multifactorial [as per Pt baseline 6-6.5] SCD/CKD- on procrit 40K/IV Iron; April 2017- RestartHydrea 500 BID  # SICKLE CELL ANEMIA/ Congestive hepatopathy [Dec 2016- Bili 5s s/p liver bx; [facility]]  # Acute renal failure/ CKD [Dr.Kolluru]/ CHF [Dr.]  # Jan 2017 ? Endocarditis s/p IV Abx/ A. Fib [not on anti-coag]    INTERVAL HISTORY:  65 year old female patient with above history of sickle cell anemia chronic kidney disease/CHF- is here for follow-up.  Patient was admitted to the hospital for fluid retention/IV Lasix drip. Patient did not need dialysis as she responded well to IV Lasix drip. Patient received 4 units of blood in the hospital for hemoglobin around 5.5.  Patient complains of fatigue. And also shortness of breath with exertion. She is able to lie on her back. No unusual cough.  Leg swellings have improved since the last hospital discharge.  She complains of pain in her bilateral wrists for the last few days. Get worse with movement. Otherwise no fever no chills. No nausea no vomiting.  REVIEW OF SYSTEMS:  A complete 10 point review of system is done which is negative except mentioned above/history of present illness.   PAST MEDICAL HISTORY :  Past Medical History  Diagnosis Date  . Sickle cell anemia (HCC)     sickle cell disease  . Vitamin D deficiency   . Hypertension   . Osteoporosis 2011    osteopenia  . 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ree [facility]  . Gout   . Sickle cell anemia (HCC)   . Lichen planus   . 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 COPD (chronic obstructive pulmonary disease) (HCC)     symptoms Dr. Patricia Pesa   . CHF (congestive heart failure) ([place])    . Congestive heart failure (CHF) ([place])   . Congestive heart failure (CHF) ([place]) 06/2015    PAST SURGICAL HISTORY :   Past Surgical History  Procedure Laterality Date  . Tubal ligation    . Gallbladder surgery    . Colonoscopy  2014    [facility]  . Peripheral vascular catheterization Left 06/14/2015    Procedure: Dialysis/Perma Catheter Insertion;  Surgeon: Katha Cabal, MD;  Location: [facility];  Service: Cardiovascular;  Laterality: Left;    FAMILY HISTORY :   Family History  Problem Relation Age of Onset  . Heart disease Father   . Diabetes Father   . Diabetes Sister   . Cancer Mother     breast cancer  . Cancer Maternal Aunt     Breast cancer    SOCIAL HISTORY:   Social History  Substance Use Topics  . Smoking status: Former Smoker -- 0.25 packs/day for 30 years    Types: Cigarettes    Quit date: 04/29/2015  . Smokeless tobacco: Never Used     Comment: quit 1 month ago  . Alcohol Use: No    ALLERGIES:  is allergic to ramipril.  MEDICATIONS:  Current Outpatient Prescriptions  Medication Sig Dispense Refill  . albuterol (PROVENTIL HFA;VENTOLIN HFA) 108 (90 Base) MCG/ACT inhaler Inhale 2 puffs into the lungs every 4 (four) hours as needed for wheezing or shortness of breath. 1 Inhaler 2  .  calcitRIOL (ROCALTROL) 0.25 MCG capsule Take 0.25 mcg by mouth daily.    . folic acid (FOLVITE) 1 MG tablet Take 1 mg by mouth daily.    . furosemide (LASIX) 80 MG tablet Take 1 tablet (80 mg total) by mouth 2 (two) times daily. 30 tablet 0  . mometasone-formoterol (DULERA) 200-5 MCG/ACT AERO Inhale 2 puffs into the lungs 2 (two) times daily. 1 Inhaler 12  . sodium bicarbonate 650 MG tablet Take by mouth.    Marland Kitchen amiodarone (PACERONE) 200 MG tablet Take 200 mg by mouth daily.    . cephALEXin (KEFLEX) 500 MG capsule Take 1 capsule (500 mg total) by mouth 3 (three) times daily. 30 capsule 0  . zaleplon (SONATA) 5 MG capsule TK 1 C PO QHS PRF SLEEP  3   No current  facility-administered medications for this visit.   Facility-Administered Medications Ordered in Other Visits  Medication Dose Route Frequency Provider Last Rate Last Dose  . 0.9 %  sodium chloride infusion  250 mL Intravenous Once Cammie Sickle, MD      . heparin lock flush 100 unit/mL  250 Units Intracatheter PRN Leia Alf, MD        PHYSICAL EXAMINATION:   BP 121/65 mmHg  Pulse 103  Temp(Src) 98.2 F (36.8 C) (Tympanic)  Resp 18  Wt 179 lb 0.2 oz (81.2 kg)  Filed Weights   09/11/15 0839  Weight: 179 lb 0.2 oz (81.2 kg)    GENERAL: Well-nourished well-developed; Alert, no distress and comfortable.  With her husband. EYES: positive for pallor/ icterus.  OROPHARYNX: no thrush or ulceration; good dentition  NECK: supple, no masses felt LYMPH:  no palpable lymphadenopathy in the cervical, axillary or inguinal regions LUNGS: clear to auscultation and  No wheeze or crackles HEART/CVS: regular rate & rhythm and no murmurs; No positive lower extremity edema ABDOMEN:abdomen soft, non-tender and normal bowel sounds Musculoskeletal:no cyanosis of digits and no clubbing; she has mild swelling in bilateral wrist/tenderness on palpation and movement. PSYCH: alert & oriented x 3 with fluent speech NEURO: no focal motor/sensory deficits SKIN:  no rashes or significant lesions  LABORATORY DATA:  I have reviewed the data as listed    Component Value Date/Time   NA 136 09/11/2015 0825   NA 138 06/20/2012 1400   NA 141 02/20/2010   K 3.8 09/11/2015 0825   K 4.3 06/20/2012 1400   CL 104 09/11/2015 0825   CL 105 06/20/2012 1400   CO2 24 09/11/2015 0825   CO2 24 06/20/2012 1400   GLUCOSE 124* 09/11/2015 0825   GLUCOSE 102* 06/20/2012 1400   BUN 57* 09/11/2015 0825   BUN 13 06/20/2012 1400   BUN 14 02/20/2010   CREATININE 2.90* 09/11/2015 0825   CREATININE 1.36* 03/22/2013 1522   CREATININE 1.1 02/20/2010   CALCIUM 8.3* 09/11/2015 0825   CALCIUM 9.4 06/20/2012 1400    PROT 6.1* 09/11/2015 0825   PROT 7.1 03/22/2013 1522   ALBUMIN 2.9* 09/11/2015 0825   ALBUMIN 3.7 03/22/2013 1522   AST 37 09/11/2015 0825   AST 48* 03/22/2013 1522   ALT 23 09/11/2015 0825   ALT 25 03/22/2013 1522   ALKPHOS 156* 09/11/2015 0825   ALKPHOS 139* 03/22/2013 1522   BILITOT 2.9* 09/11/2015 0825   BILITOT 4.0* 03/22/2013 1522   GFRNONAA 16* 09/11/2015 0825   GFRNONAA 42* 03/22/2013 1522   GFRAA 19* 09/11/2015 0825   GFRAA 48* 03/22/2013 1522    No results found for: SPEP, UPEP  Lab Results  Component Value Date   WBC 11.7* 09/11/2015   NEUTROABS 8.4* 09/11/2015   HGB 4.8* 09/11/2015   HCT 14.4* 09/11/2015   MCV 84.9 09/11/2015   PLT 352 09/11/2015      Chemistry      Component Value Date/Time   NA 136 09/11/2015 0825   NA 138 06/20/2012 1400   NA 141 02/20/2010   K 3.8 09/11/2015 0825   K 4.3 06/20/2012 1400   CL 104 09/11/2015 0825   CL 105 06/20/2012 1400   CO2 24 09/11/2015 0825   CO2 24 06/20/2012 1400   BUN 57* 09/11/2015 0825   BUN 13 06/20/2012 1400   BUN 14 02/20/2010   CREATININE 2.90* 09/11/2015 0825   CREATININE 1.36* 03/22/2013 1522   CREATININE 1.1 02/20/2010   GLU 89 02/20/2010      Component Value Date/Time   CALCIUM 8.3* 09/11/2015 0825   CALCIUM 9.4 06/20/2012 1400   ALKPHOS 156* 09/11/2015 0825   ALKPHOS 139* 03/22/2013 1522   AST 37 09/11/2015 0825   AST 48* 03/22/2013 1522   ALT 23 09/11/2015 0825   ALT 25 03/22/2013 1522   BILITOT 2.9* 09/11/2015 0825   BILITOT 4.0* 03/22/2013 1522         ASSESSMENT & PLAN:   # SEVERE ANEMIA-Multifactorial chronic kidney disease/iron deficiency/sickle cell disease. Patient has adequate reticulocyte count for the degree of anemia which explains brisk response of the bone marrow to the anemia. I do not think bone marrow biopsy would be revealing any further. However if her anemia continues to not improve on Procrit/IV iron Bone marrow will be considered  #  Baseline hemoglobin  around 6-6.5/ continue Procrit 40,000 units every week. Patient received IV iron in March 2017. Preliminary hemoglobin 4.9 today. We will plan PRBC 1 unit transfusion today. Iron studies study shows a saturation of 6%. Recommend IV Feraheme on a monthly basis. # Sickle cell disease- confirmed on hemoglobin electrophoresis. On folic acid once a day. Recommend Hydrea 500 mg twice a day.   #  Chronic kidney disease/ acute renal failure- today creatinine is 2.9. Has appt with Dr.Kolluru today.   #  Congestive hepatopathy- bilirubin improving at 2.9.  # Swelling and pain of the wrist of the right and left- question infection. Clinically does not appear to be septic arthritis. Recommend Keflex 500 mg 3 times a day.  #  Patient will follow-up with me in approximately  4 weeks;  Continue Procrit weekly; also IV iron monthly basis.  # The above plan of care was discussed with Dr.Kolluru.      Cammie Sickle, MD 09/11/2015 12:50 PM

## 2015-09-11 NOTE — Telephone Encounter (Signed)
Spoke with Henrene Pastor regarding Procrit and if it had been authorized. She said it was ok to give.

## 2015-09-11 NOTE — Progress Notes (Signed)
Patient states her hands and feet are tender and sensitive. Thinks it is because of diuretic.  Seeing kidney specialist today.

## 2015-09-12 ENCOUNTER — Other Ambulatory Visit: Payer: Self-pay | Admitting: *Deleted

## 2015-09-12 ENCOUNTER — Telehealth: Payer: Self-pay | Admitting: *Deleted

## 2015-09-12 DIAGNOSIS — M109 Gout, unspecified: Secondary | ICD-10-CM

## 2015-09-12 DIAGNOSIS — N2589 Other disorders resulting from impaired renal tubular function: Secondary | ICD-10-CM

## 2015-09-12 DIAGNOSIS — D571 Sickle-cell disease without crisis: Secondary | ICD-10-CM

## 2015-09-12 DIAGNOSIS — I10 Essential (primary) hypertension: Secondary | ICD-10-CM

## 2015-09-12 DIAGNOSIS — N2581 Secondary hyperparathyroidism of renal origin: Secondary | ICD-10-CM

## 2015-09-12 DIAGNOSIS — N184 Chronic kidney disease, stage 4 (severe): Secondary | ICD-10-CM

## 2015-09-12 NOTE — Telephone Encounter (Signed)
-----   Message from Cammie Sickle, MD sent at 09/11/2015 12:47 PM EDT ----- Patient needs IV iron; Feraheme IV monthly x3; she can start it next week.  Also I sent the prescription for Keflex to her pharmacy. Please inform patient. Thx

## 2015-09-12 NOTE — Telephone Encounter (Signed)
Attempted to call patient several times throughout the morning.  No answer and unable to leave message.  Will continue to call.

## 2015-09-12 NOTE — Telephone Encounter (Signed)
Called patient @ 15:35.  No answer and no availability to leave voice mail.

## 2015-09-12 NOTE — Telephone Encounter (Signed)
Called pharmacy and asked if Keflex has been picked up by the patient. Confirmed that patient has picked up prescription.

## 2015-09-14 LAB — TYPE AND SCREEN
ABO/RH(D): O POS
ABO/RH(D): O POS
ABO/RH(D): O POS
ABO/RH(D): O POS
ANTIBODY SCREEN: NEGATIVE
ANTIBODY SCREEN: NEGATIVE
Antibody Screen: NEGATIVE
Antibody Screen: NEGATIVE
UNIT DIVISION: 0
UNIT DIVISION: 0
UNIT DIVISION: 0
UNIT DIVISION: 0
Unit division: 0
Unit division: 0
Unit division: 0

## 2015-09-18 ENCOUNTER — Inpatient Hospital Stay: Payer: BLUE CROSS/BLUE SHIELD

## 2015-09-18 ENCOUNTER — Observation Stay
Admission: AD | Admit: 2015-09-18 | Discharge: 2015-09-18 | Disposition: A | Discharge status: Home / Self Care | Payer: BLUE CROSS/BLUE SHIELD | Source: Ambulatory Visit | Attending: Internal Medicine | Admitting: Internal Medicine

## 2015-09-18 ENCOUNTER — Other Ambulatory Visit: Payer: Self-pay | Admitting: Internal Medicine

## 2015-09-18 ENCOUNTER — Other Ambulatory Visit: Payer: Self-pay | Admitting: *Deleted

## 2015-09-18 ENCOUNTER — Telehealth: Payer: Self-pay | Admitting: *Deleted

## 2015-09-18 DIAGNOSIS — N189 Chronic kidney disease, unspecified: Secondary | ICD-10-CM | POA: Diagnosis not present

## 2015-09-18 DIAGNOSIS — D631 Anemia in chronic kidney disease: Secondary | ICD-10-CM

## 2015-09-18 DIAGNOSIS — D571 Sickle-cell disease without crisis: Principal | ICD-10-CM | POA: Insufficient documentation

## 2015-09-18 DIAGNOSIS — I130 Hypertensive heart and chronic kidney disease with heart failure and stage 1 through stage 4 chronic kidney disease, or unspecified chronic kidney disease: Secondary | ICD-10-CM | POA: Insufficient documentation

## 2015-09-18 DIAGNOSIS — M810 Age-related osteoporosis without current pathological fracture: Secondary | ICD-10-CM | POA: Insufficient documentation

## 2015-09-18 DIAGNOSIS — E559 Vitamin D deficiency, unspecified: Secondary | ICD-10-CM | POA: Diagnosis not present

## 2015-09-18 DIAGNOSIS — D151 Benign neoplasm of heart: Secondary | ICD-10-CM | POA: Insufficient documentation

## 2015-09-18 DIAGNOSIS — J449 Chronic obstructive pulmonary disease, unspecified: Secondary | ICD-10-CM | POA: Insufficient documentation

## 2015-09-18 DIAGNOSIS — Z8249 Family history of ischemic heart disease and other diseases of the circulatory system: Secondary | ICD-10-CM | POA: Insufficient documentation

## 2015-09-18 DIAGNOSIS — M109 Gout, unspecified: Secondary | ICD-10-CM | POA: Insufficient documentation

## 2015-09-18 DIAGNOSIS — G111 Early-onset cerebellar ataxia: Secondary | ICD-10-CM | POA: Diagnosis not present

## 2015-09-18 DIAGNOSIS — I129 Hypertensive chronic kidney disease with stage 1 through stage 4 chronic kidney disease, or unspecified chronic kidney disease: Secondary | ICD-10-CM | POA: Diagnosis not present

## 2015-09-18 DIAGNOSIS — Z803 Family history of malignant neoplasm of breast: Secondary | ICD-10-CM | POA: Insufficient documentation

## 2015-09-18 DIAGNOSIS — Z87891 Personal history of nicotine dependence: Secondary | ICD-10-CM | POA: Diagnosis not present

## 2015-09-18 DIAGNOSIS — N183 Chronic kidney disease, stage 3 unspecified: Secondary | ICD-10-CM

## 2015-09-18 DIAGNOSIS — I509 Heart failure, unspecified: Secondary | ICD-10-CM | POA: Diagnosis not present

## 2015-09-18 DIAGNOSIS — L439 Lichen planus, unspecified: Secondary | ICD-10-CM | POA: Insufficient documentation

## 2015-09-18 DIAGNOSIS — Z888 Allergy status to other drugs, medicaments and biological substances status: Secondary | ICD-10-CM | POA: Insufficient documentation

## 2015-09-18 DIAGNOSIS — Z833 Family history of diabetes mellitus: Secondary | ICD-10-CM | POA: Insufficient documentation

## 2015-09-18 DIAGNOSIS — D509 Iron deficiency anemia, unspecified: Secondary | ICD-10-CM

## 2015-09-18 DIAGNOSIS — Z79899 Other long term (current) drug therapy: Secondary | ICD-10-CM | POA: Insufficient documentation

## 2015-09-18 LAB — HEMOGLOBIN: Hemoglobin: 3.6 g/dL — CL (ref 12.0–16.0)

## 2015-09-18 LAB — SAMPLE TO BLOOD BANK

## 2015-09-18 LAB — HEMATOCRIT: HCT: 12 % — CL (ref 35.0–47.0)

## 2015-09-18 LAB — PREPARE RBC (CROSSMATCH)

## 2015-09-18 MED ORDER — ACETAMINOPHEN 325 MG PO TABS
650.0000 mg | ORAL_TABLET | Freq: Once | ORAL | Status: AC
  Administered 2015-09-18: 16:00:00 650 mg via ORAL
  Filled 2015-09-18: qty 2

## 2015-09-18 MED ORDER — HEPARIN SOD (PORK) LOCK FLUSH 100 UNIT/ML IV SOLN: 250.0000 [IU] | INTRAVENOUS | Status: DC | PRN

## 2015-09-18 MED ORDER — HEPARIN SOD (PORK) LOCK FLUSH 100 UNIT/ML IV SOLN: 500.0000 [IU] | Freq: Every day | INTRAVENOUS | Status: DC | PRN

## 2015-09-18 MED ORDER — SODIUM CHLORIDE 0.9 % IV SOLN
250.0000 mL | Freq: Once | INTRAVENOUS | Status: AC
  Administered 2015-09-18: 250 mL via INTRAVENOUS

## 2015-09-18 MED ORDER — DIPHENHYDRAMINE HCL 25 MG PO CAPS
25.0000 mg | ORAL_CAPSULE | Freq: Once | ORAL | Status: AC
  Administered 2015-09-18: 16:00:00 25 mg via ORAL
  Filled 2015-09-18: qty 1

## 2015-09-18 MED ORDER — SODIUM CHLORIDE 0.9% FLUSH: 3.0000 mL | INTRAVENOUS | Status: DC | PRN

## 2015-09-18 MED ORDER — SODIUM CHLORIDE 0.9% FLUSH: 10.0000 mL | INTRAVENOUS | Status: DC | PRN

## 2015-09-18 MED ORDER — FUROSEMIDE 10 MG/ML IJ SOLN
20.0000 mg | Freq: Once | INTRAMUSCULAR | Status: AC
  Administered 2015-09-18: 16:00:00 20 mg via INTRAVENOUS
  Filled 2015-09-18: qty 2

## 2015-09-18 NOTE — Progress Notes (Signed)
Blood bank orders released for prepare rbc 2 units of blood/type&screen at 1236. Spoke with Shelly in Blood bank-notified shelly that pt is being transferred to unit to receive the blood.

## 2015-09-18 NOTE — Progress Notes (Signed)
2nd unit of blood completed. Pt tolerated well. Vitals post blood. Discontinued iv . Pt escorted on a wheelchair to the visitor's entrance by nursing staff. Pt was to be discharged home post blood transfusion.  Continue to monitor.

## 2015-09-18 NOTE — Telephone Encounter (Addendum)
1128 am today Critical value hgb called by Collene Mares. hgb 3.6/ hct 12.0.  Read back process performed with lab tech.  Read back process performed with MD--Dr. Rogue Bussing informed at 1130 am.  MD asked RN to transport patient to ED for 2 units of blood. I called report to charge nurse in ED, who advised me to contact [facility] dept to see if this could be an outpatient transfusion to avoid ED charges. I spoke with Becky in [place].  Due to the safe staffing concerns and high acuity patient needs, [facility] unable to accommodate patient today.  Md made a decision for phase 3 observation. Order given by MD for observation. Md entered blood bank orders. Pt was notified of plan of care. Pauline Good, RN to give report to unit taking patient.

## 2015-09-20 ENCOUNTER — Inpatient Hospital Stay: Payer: BLUE CROSS/BLUE SHIELD

## 2015-09-23 LAB — TYPE AND SCREEN
ABO/RH(D): O POS
ANTIBODY SCREEN: NEGATIVE
Unit division: 0
Unit division: 0

## 2015-09-24 ENCOUNTER — Telehealth: Payer: Self-pay | Admitting: *Deleted

## 2015-09-24 NOTE — Telephone Encounter (Signed)
Called pt to inform her that we needed to set her up for a CPAP titration because of the positive sleep study. Pt refused to have the titration. States she doesn't want to do all of that. Informed pt I would let you know.

## 2015-09-25 ENCOUNTER — Telehealth: Payer: Self-pay | Admitting: *Deleted

## 2015-09-25 ENCOUNTER — Inpatient Hospital Stay: Payer: BLUE CROSS/BLUE SHIELD

## 2015-09-25 ENCOUNTER — Other Ambulatory Visit: Payer: Self-pay | Admitting: Internal Medicine

## 2015-09-25 ENCOUNTER — Other Ambulatory Visit: Payer: Self-pay | Admitting: *Deleted

## 2015-09-25 VITALS — BP 123/68 | HR 86 | Resp 18

## 2015-09-25 DIAGNOSIS — N189 Chronic kidney disease, unspecified: Principal | ICD-10-CM

## 2015-09-25 DIAGNOSIS — N183 Chronic kidney disease, stage 3 unspecified: Secondary | ICD-10-CM

## 2015-09-25 DIAGNOSIS — D631 Anemia in chronic kidney disease: Secondary | ICD-10-CM

## 2015-09-25 DIAGNOSIS — D571 Sickle-cell disease without crisis: Secondary | ICD-10-CM

## 2015-09-25 DIAGNOSIS — I129 Hypertensive chronic kidney disease with stage 1 through stage 4 chronic kidney disease, or unspecified chronic kidney disease: Secondary | ICD-10-CM | POA: Diagnosis not present

## 2015-09-25 DIAGNOSIS — D509 Iron deficiency anemia, unspecified: Secondary | ICD-10-CM

## 2015-09-25 LAB — SAMPLE TO BLOOD BANK

## 2015-09-25 LAB — HEMATOCRIT: HCT: 15.1 % — ABNORMAL LOW (ref 35.0–47.0)

## 2015-09-25 LAB — HEMOGLOBIN: Hemoglobin: 4.6 g/dL — CL (ref 12.0–16.0)

## 2015-09-25 LAB — PREPARE RBC (CROSSMATCH)

## 2015-09-25 MED ORDER — EPOETIN ALFA 40000 UNIT/ML IJ SOLN
40000.0000 [IU] | Freq: Once | INTRAMUSCULAR | Status: AC
  Administered 2015-09-25: 40000 [IU] via SUBCUTANEOUS
  Filled 2015-09-25: qty 1

## 2015-09-25 NOTE — Telephone Encounter (Signed)
11.14 am Kim in cancer center lab contacted Nira Conn, RN for critical value. hgb 4.6. Read back process performed with Maudie Mercury.   Dr. Rogue Bussing performed at 1140 am. Read back process performed with md.

## 2015-09-26 ENCOUNTER — Inpatient Hospital Stay: Payer: BLUE CROSS/BLUE SHIELD

## 2015-09-26 ENCOUNTER — Encounter: Payer: Self-pay | Admitting: *Deleted

## 2015-09-26 VITALS — BP 114/65 | HR 66 | Temp 97.7°F | Resp 18

## 2015-09-26 DIAGNOSIS — N189 Chronic kidney disease, unspecified: Principal | ICD-10-CM

## 2015-09-26 DIAGNOSIS — D631 Anemia in chronic kidney disease: Secondary | ICD-10-CM

## 2015-09-26 DIAGNOSIS — I129 Hypertensive chronic kidney disease with stage 1 through stage 4 chronic kidney disease, or unspecified chronic kidney disease: Secondary | ICD-10-CM | POA: Diagnosis not present

## 2015-09-26 MED ORDER — SODIUM CHLORIDE 0.9 % IV SOLN
250.0000 mL | Freq: Once | INTRAVENOUS | Status: AC
  Administered 2015-09-26: 250 mL via INTRAVENOUS
  Filled 2015-09-26: qty 250

## 2015-09-26 MED ORDER — DIPHENHYDRAMINE HCL 25 MG PO CAPS
25.0000 mg | ORAL_CAPSULE | Freq: Once | ORAL | Status: AC
  Administered 2015-09-26: 25 mg via ORAL
  Filled 2015-09-26: qty 1

## 2015-09-26 MED ORDER — FUROSEMIDE 10 MG/ML IJ SOLN
20.0000 mg | Freq: Once | INTRAMUSCULAR | Status: AC
  Administered 2015-09-26: 20 mg via INTRAVENOUS
  Filled 2015-09-26: qty 2

## 2015-09-26 MED ORDER — ACETAMINOPHEN 325 MG PO TABS
650.0000 mg | ORAL_TABLET | Freq: Once | ORAL | Status: AC
  Administered 2015-09-26: 650 mg via ORAL
  Filled 2015-09-26: qty 2

## 2015-09-28 LAB — TYPE AND SCREEN
ABO/RH(D): O POS
Antibody Screen: NEGATIVE
UNIT DIVISION: 0
UNIT DIVISION: 0

## 2015-10-02 ENCOUNTER — Other Ambulatory Visit: Payer: Self-pay | Admitting: Internal Medicine

## 2015-10-02 ENCOUNTER — Inpatient Hospital Stay: Payer: BLUE CROSS/BLUE SHIELD

## 2015-10-02 ENCOUNTER — Observation Stay
Admission: AD | Admit: 2015-10-02 | Discharge: 2015-10-02 | Disposition: A | Discharge status: Home / Self Care | Payer: BLUE CROSS/BLUE SHIELD | Source: Ambulatory Visit | Attending: Internal Medicine | Admitting: Internal Medicine

## 2015-10-02 ENCOUNTER — Inpatient Hospital Stay: Payer: BLUE CROSS/BLUE SHIELD | Attending: Internal Medicine

## 2015-10-02 ENCOUNTER — Other Ambulatory Visit: Payer: Self-pay | Admitting: *Deleted

## 2015-10-02 VITALS — BP 113/62 | HR 74 | Temp 97.0°F | Resp 18

## 2015-10-02 DIAGNOSIS — M25531 Pain in right wrist: Secondary | ICD-10-CM | POA: Insufficient documentation

## 2015-10-02 DIAGNOSIS — D631 Anemia in chronic kidney disease: Secondary | ICD-10-CM

## 2015-10-02 DIAGNOSIS — E559 Vitamin D deficiency, unspecified: Secondary | ICD-10-CM | POA: Diagnosis not present

## 2015-10-02 DIAGNOSIS — Z79899 Other long term (current) drug therapy: Secondary | ICD-10-CM | POA: Diagnosis not present

## 2015-10-02 DIAGNOSIS — R0602 Shortness of breath: Secondary | ICD-10-CM | POA: Diagnosis not present

## 2015-10-02 DIAGNOSIS — M109 Gout, unspecified: Secondary | ICD-10-CM | POA: Insufficient documentation

## 2015-10-02 DIAGNOSIS — I130 Hypertensive heart and chronic kidney disease with heart failure and stage 1 through stage 4 chronic kidney disease, or unspecified chronic kidney disease: Secondary | ICD-10-CM | POA: Insufficient documentation

## 2015-10-02 DIAGNOSIS — L439 Lichen planus, unspecified: Secondary | ICD-10-CM | POA: Diagnosis not present

## 2015-10-02 DIAGNOSIS — D151 Benign neoplasm of heart: Secondary | ICD-10-CM | POA: Insufficient documentation

## 2015-10-02 DIAGNOSIS — J449 Chronic obstructive pulmonary disease, unspecified: Secondary | ICD-10-CM | POA: Insufficient documentation

## 2015-10-02 DIAGNOSIS — D696 Thrombocytopenia, unspecified: Secondary | ICD-10-CM | POA: Diagnosis not present

## 2015-10-02 DIAGNOSIS — N179 Acute kidney failure, unspecified: Secondary | ICD-10-CM | POA: Insufficient documentation

## 2015-10-02 DIAGNOSIS — I129 Hypertensive chronic kidney disease with stage 1 through stage 4 chronic kidney disease, or unspecified chronic kidney disease: Secondary | ICD-10-CM | POA: Insufficient documentation

## 2015-10-02 DIAGNOSIS — N189 Chronic kidney disease, unspecified: Principal | ICD-10-CM

## 2015-10-02 DIAGNOSIS — I509 Heart failure, unspecified: Secondary | ICD-10-CM | POA: Insufficient documentation

## 2015-10-02 DIAGNOSIS — M810 Age-related osteoporosis without current pathological fracture: Secondary | ICD-10-CM | POA: Insufficient documentation

## 2015-10-02 DIAGNOSIS — D571 Sickle-cell disease without crisis: Secondary | ICD-10-CM | POA: Diagnosis present

## 2015-10-02 DIAGNOSIS — R21 Rash and other nonspecific skin eruption: Secondary | ICD-10-CM | POA: Insufficient documentation

## 2015-10-02 DIAGNOSIS — Z9049 Acquired absence of other specified parts of digestive tract: Secondary | ICD-10-CM | POA: Diagnosis not present

## 2015-10-02 DIAGNOSIS — R5383 Other fatigue: Secondary | ICD-10-CM | POA: Insufficient documentation

## 2015-10-02 DIAGNOSIS — D509 Iron deficiency anemia, unspecified: Secondary | ICD-10-CM

## 2015-10-02 DIAGNOSIS — N184 Chronic kidney disease, stage 4 (severe): Secondary | ICD-10-CM

## 2015-10-02 DIAGNOSIS — Z8249 Family history of ischemic heart disease and other diseases of the circulatory system: Secondary | ICD-10-CM | POA: Diagnosis not present

## 2015-10-02 DIAGNOSIS — G111 Early-onset cerebellar ataxia: Secondary | ICD-10-CM | POA: Insufficient documentation

## 2015-10-02 DIAGNOSIS — I4891 Unspecified atrial fibrillation: Secondary | ICD-10-CM | POA: Insufficient documentation

## 2015-10-02 DIAGNOSIS — K7689 Other specified diseases of liver: Secondary | ICD-10-CM | POA: Insufficient documentation

## 2015-10-02 DIAGNOSIS — M25532 Pain in left wrist: Secondary | ICD-10-CM | POA: Insufficient documentation

## 2015-10-02 DIAGNOSIS — N183 Chronic kidney disease, stage 3 unspecified: Secondary | ICD-10-CM

## 2015-10-02 DIAGNOSIS — Z87891 Personal history of nicotine dependence: Secondary | ICD-10-CM | POA: Insufficient documentation

## 2015-10-02 DIAGNOSIS — Z803 Family history of malignant neoplasm of breast: Secondary | ICD-10-CM | POA: Diagnosis not present

## 2015-10-02 DIAGNOSIS — N2581 Secondary hyperparathyroidism of renal origin: Secondary | ICD-10-CM

## 2015-10-02 DIAGNOSIS — M818 Other osteoporosis without current pathological fracture: Secondary | ICD-10-CM | POA: Diagnosis not present

## 2015-10-02 DIAGNOSIS — Z888 Allergy status to other drugs, medicaments and biological substances status: Secondary | ICD-10-CM | POA: Diagnosis not present

## 2015-10-02 DIAGNOSIS — Z833 Family history of diabetes mellitus: Secondary | ICD-10-CM | POA: Insufficient documentation

## 2015-10-02 DIAGNOSIS — D638 Anemia in other chronic diseases classified elsewhere: Secondary | ICD-10-CM | POA: Insufficient documentation

## 2015-10-02 DIAGNOSIS — N2589 Other disorders resulting from impaired renal tubular function: Secondary | ICD-10-CM

## 2015-10-02 DIAGNOSIS — D219 Benign neoplasm of connective and other soft tissue, unspecified: Secondary | ICD-10-CM | POA: Diagnosis not present

## 2015-10-02 DIAGNOSIS — Z7951 Long term (current) use of inhaled steroids: Secondary | ICD-10-CM | POA: Insufficient documentation

## 2015-10-02 DIAGNOSIS — I10 Essential (primary) hypertension: Secondary | ICD-10-CM | POA: Diagnosis not present

## 2015-10-02 HISTORY — DX: Anemia, unspecified: D64.9

## 2015-10-02 HISTORY — DX: Thrombocytopenia, unspecified: D69.6

## 2015-10-02 LAB — URINALYSIS COMPLETE WITH MICROSCOPIC (ARMC ONLY)
BACTERIA UA: NONE SEEN
Bilirubin Urine: NEGATIVE
Glucose, UA: NEGATIVE mg/dL
HGB URINE DIPSTICK: NEGATIVE
Ketones, ur: NEGATIVE mg/dL
LEUKOCYTES UA: NEGATIVE
Nitrite: NEGATIVE
PH: 7 (ref 5.0–8.0)
Protein, ur: NEGATIVE mg/dL
RBC / HPF: NONE SEEN RBC/hpf (ref 0–5)
Specific Gravity, Urine: 1.006 (ref 1.005–1.030)
WBC, UA: NONE SEEN WBC/hpf (ref 0–5)

## 2015-10-02 LAB — RENAL FUNCTION PANEL
ALBUMIN: 2.7 g/dL — AB (ref 3.5–5.0)
ANION GAP: 10 (ref 5–15)
BUN: 56 mg/dL — AB (ref 6–20)
CALCIUM: 8.1 mg/dL — AB (ref 8.9–10.3)
CO2: 23 mmol/L (ref 22–32)
Chloride: 104 mmol/L (ref 101–111)
Creatinine, Ser: 2.58 mg/dL — ABNORMAL HIGH (ref 0.44–1.00)
GFR calc Af Amer: 21 mL/min — ABNORMAL LOW (ref >60)
GFR calc non Af Amer: 19 mL/min — ABNORMAL LOW (ref >60)
GLUCOSE: 86 mg/dL (ref 65–99)
Phosphorus: 4.2 mg/dL (ref 2.5–4.6)
Potassium: 3.9 mmol/L (ref 3.5–5.1)
SODIUM: 137 mmol/L (ref 135–145)

## 2015-10-02 LAB — SAMPLE TO BLOOD BANK

## 2015-10-02 LAB — CBC WITH DIFFERENTIAL/PLATELET
BASOS ABS: 0.1 10*3/uL (ref 0–0.1)
Basophils Relative: 1 %
EOS ABS: 0.2 10*3/uL (ref 0–0.7)
Eosinophils Relative: 3 %
HEMATOCRIT: 14.2 % — AB (ref 35.0–47.0)
HEMOGLOBIN: 4.4 g/dL — AB (ref 12.0–16.0)
LYMPHS ABS: 1 10*3/uL (ref 1.0–3.6)
Lymphocytes Relative: 17 %
MCH: 21.9 pg — ABNORMAL LOW (ref 26.0–34.0)
MCHC: 30.8 g/dL — ABNORMAL LOW (ref 32.0–36.0)
MCV: 71.3 fL — AB (ref 80.0–100.0)
MONO ABS: 0.9 10*3/uL (ref 0.2–0.9)
NEUTROS ABS: 3.8 10*3/uL (ref 1.4–6.5)
NRBC: 27 /100{WBCs} — AB
Neutrophils Relative %: 64 %
Platelets: 302 10*3/uL (ref 150–440)
RBC: 2 MIL/uL — AB (ref 3.80–5.20)
RDW: 31.4 % — AB (ref 11.5–14.5)
WBC: 6 10*3/uL (ref 3.6–11.0)

## 2015-10-02 LAB — PROTEIN / CREATININE RATIO, URINE: CREATININE, URINE: 33 mg/dL

## 2015-10-02 LAB — PREPARE RBC (CROSSMATCH)

## 2015-10-02 MED ORDER — SODIUM CHLORIDE 0.9% FLUSH: 10.0000 mL | INTRAVENOUS | Status: DC | PRN

## 2015-10-02 MED ORDER — SODIUM CHLORIDE 0.9% FLUSH: 3.0000 mL | INTRAVENOUS | Status: DC | PRN

## 2015-10-02 MED ORDER — HEPARIN SOD (PORK) LOCK FLUSH 100 UNIT/ML IV SOLN: 500.0000 [IU] | Freq: Every day | INTRAVENOUS | Status: DC | PRN

## 2015-10-02 MED ORDER — FUROSEMIDE 10 MG/ML IJ SOLN
20.0000 mg | Freq: Once | INTRAMUSCULAR | Status: AC
  Administered 2015-10-02: 17:00:00 20 mg via INTRAVENOUS
  Filled 2015-10-02: qty 2

## 2015-10-02 MED ORDER — SODIUM CHLORIDE 0.9 % IV SOLN
250.0000 mL | Freq: Once | INTRAVENOUS | Status: AC
  Administered 2015-10-02: 250 mL via INTRAVENOUS

## 2015-10-02 MED ORDER — HEPARIN SOD (PORK) LOCK FLUSH 100 UNIT/ML IV SOLN: 250.0000 [IU] | INTRAVENOUS | Status: DC | PRN

## 2015-10-02 MED ORDER — EPOETIN ALFA 40000 UNIT/ML IJ SOLN
40000.0000 [IU] | Freq: Once | INTRAMUSCULAR | Status: AC
  Administered 2015-10-02: 40000 [IU] via SUBCUTANEOUS
  Filled 2015-10-02: qty 1

## 2015-10-02 MED ORDER — DIPHENHYDRAMINE HCL 25 MG PO CAPS
25.0000 mg | ORAL_CAPSULE | Freq: Once | ORAL | Status: AC
  Administered 2015-10-02: 17:00:00 25 mg via ORAL
  Filled 2015-10-02: qty 1

## 2015-10-02 MED ORDER — ACETAMINOPHEN 325 MG PO TABS
650.0000 mg | ORAL_TABLET | Freq: Once | ORAL | Status: AC
  Administered 2015-10-02: 17:00:00 650 mg via ORAL
  Filled 2015-10-02: qty 2

## 2015-10-02 NOTE — Progress Notes (Signed)
Received order to discharge from Dr Rogue Bussing. Removed peripheral IV to Left wrist and pt discharged to car in wheelchair in company of husband. Dorna Bloom RN

## 2015-10-02 NOTE — Progress Notes (Signed)
MD entered ordered for 2 units of blood today. Hgb 4.6. Blood bank orders released. Spoke with/notified Shelly in blood bank-pt will be transfused on inpatient unit-phase 3 admission. Plan of care reviewed with patient. Pt informed that she will go home after units of blood will be transfused.  Rn also spoke with Shirlean Mylar, RN on 1C to discuss plan of care with unit.  Waiting on bed placement at this time. Pt made aware.

## 2015-10-02 NOTE — Plan of Care (Signed)
Problem: Education: Goal: Knowledge of [place] [facility] Education information/materials will improve Outcome: Progressing General Education Handout Provided. No unanswered questions.  Problem: Tissue Perfusion: Goal: Risk factors for ineffective tissue perfusion will decrease Outcome: Progressing First unit of PRBC's initiated. VSS. Tolerating well.  Problem: Fluid Volume: Goal: Ability to maintain a balanced intake and output will improve Outcome: Progressing First unit of PRBC's initiated. VSS. Tolerating well.

## 2015-10-03 LAB — TYPE AND SCREEN
ABO/RH(D): O POS
Antibody Screen: NEGATIVE
Unit division: 0
Unit division: 0

## 2015-10-03 LAB — PARATHYROID HORMONE, INTACT (NO CA): PTH: 306 pg/mL — ABNORMAL HIGH (ref 15–65)

## 2015-10-09 ENCOUNTER — Inpatient Hospital Stay: Payer: BLUE CROSS/BLUE SHIELD

## 2015-10-09 ENCOUNTER — Inpatient Hospital Stay (HOSPITAL_BASED_OUTPATIENT_CLINIC_OR_DEPARTMENT_OTHER): Payer: BLUE CROSS/BLUE SHIELD | Admitting: Internal Medicine

## 2015-10-09 VITALS — BP 118/59 | HR 60 | Temp 97.4°F | Resp 14 | Wt 179.9 lb

## 2015-10-09 DIAGNOSIS — G111 Early-onset cerebellar ataxia: Secondary | ICD-10-CM

## 2015-10-09 DIAGNOSIS — N183 Chronic kidney disease, stage 3 unspecified: Secondary | ICD-10-CM

## 2015-10-09 DIAGNOSIS — N189 Chronic kidney disease, unspecified: Secondary | ICD-10-CM

## 2015-10-09 DIAGNOSIS — D631 Anemia in chronic kidney disease: Secondary | ICD-10-CM

## 2015-10-09 DIAGNOSIS — M25531 Pain in right wrist: Secondary | ICD-10-CM

## 2015-10-09 DIAGNOSIS — E559 Vitamin D deficiency, unspecified: Secondary | ICD-10-CM

## 2015-10-09 DIAGNOSIS — N179 Acute kidney failure, unspecified: Secondary | ICD-10-CM | POA: Diagnosis not present

## 2015-10-09 DIAGNOSIS — D696 Thrombocytopenia, unspecified: Secondary | ICD-10-CM

## 2015-10-09 DIAGNOSIS — M109 Gout, unspecified: Secondary | ICD-10-CM

## 2015-10-09 DIAGNOSIS — D509 Iron deficiency anemia, unspecified: Secondary | ICD-10-CM

## 2015-10-09 DIAGNOSIS — Z803 Family history of malignant neoplasm of breast: Secondary | ICD-10-CM

## 2015-10-09 DIAGNOSIS — I10 Essential (primary) hypertension: Secondary | ICD-10-CM

## 2015-10-09 DIAGNOSIS — D571 Sickle-cell disease without crisis: Secondary | ICD-10-CM

## 2015-10-09 DIAGNOSIS — M818 Other osteoporosis without current pathological fracture: Secondary | ICD-10-CM

## 2015-10-09 DIAGNOSIS — K7689 Other specified diseases of liver: Secondary | ICD-10-CM

## 2015-10-09 DIAGNOSIS — Z87891 Personal history of nicotine dependence: Secondary | ICD-10-CM

## 2015-10-09 DIAGNOSIS — D638 Anemia in other chronic diseases classified elsewhere: Secondary | ICD-10-CM

## 2015-10-09 DIAGNOSIS — R0602 Shortness of breath: Secondary | ICD-10-CM

## 2015-10-09 DIAGNOSIS — L439 Lichen planus, unspecified: Secondary | ICD-10-CM

## 2015-10-09 DIAGNOSIS — I509 Heart failure, unspecified: Secondary | ICD-10-CM

## 2015-10-09 DIAGNOSIS — M25532 Pain in left wrist: Secondary | ICD-10-CM

## 2015-10-09 DIAGNOSIS — R21 Rash and other nonspecific skin eruption: Secondary | ICD-10-CM

## 2015-10-09 DIAGNOSIS — D219 Benign neoplasm of connective and other soft tissue, unspecified: Secondary | ICD-10-CM

## 2015-10-09 DIAGNOSIS — R5383 Other fatigue: Secondary | ICD-10-CM

## 2015-10-09 DIAGNOSIS — J449 Chronic obstructive pulmonary disease, unspecified: Secondary | ICD-10-CM

## 2015-10-09 DIAGNOSIS — Z79899 Other long term (current) drug therapy: Secondary | ICD-10-CM

## 2015-10-09 DIAGNOSIS — I4891 Unspecified atrial fibrillation: Secondary | ICD-10-CM

## 2015-10-09 LAB — CBC WITH DIFFERENTIAL/PLATELET
BASOS PCT: 1 %
Basophils Absolute: 0.1 10*3/uL (ref 0–0.1)
EOS ABS: 0.5 10*3/uL (ref 0–0.7)
EOS PCT: 6 %
HEMATOCRIT: 16.6 % — AB (ref 35.0–47.0)
Hemoglobin: 5.2 g/dL — ABNORMAL LOW (ref 12.0–16.0)
Lymphocytes Relative: 9 %
Lymphs Abs: 0.8 10*3/uL — ABNORMAL LOW (ref 1.0–3.6)
MCH: 22 pg — AB (ref 26.0–34.0)
MCHC: 31 g/dL — ABNORMAL LOW (ref 32.0–36.0)
MCV: 70.8 fL — AB (ref 80.0–100.0)
MONO ABS: 1.7 10*3/uL — AB (ref 0.2–0.9)
MONOS PCT: 19 %
Neutro Abs: 6.1 10*3/uL (ref 1.4–6.5)
Neutrophils Relative %: 66 %
PLATELETS: 422 10*3/uL (ref 150–440)
RBC: 2.35 MIL/uL — ABNORMAL LOW (ref 3.80–5.20)
RDW: 32.2 % — AB (ref 11.5–14.5)
WBC: 8.8 10*3/uL (ref 3.6–11.0)
nRBC: 20 /100 WBC — ABNORMAL HIGH

## 2015-10-09 LAB — COMPREHENSIVE METABOLIC PANEL
ALT: 22 U/L (ref 14–54)
AST: 31 U/L (ref 15–41)
Albumin: 3 g/dL — ABNORMAL LOW (ref 3.5–5.0)
Alkaline Phosphatase: 207 U/L — ABNORMAL HIGH (ref 38–126)
Anion gap: 5 (ref 5–15)
BILIRUBIN TOTAL: 0.9 mg/dL (ref 0.3–1.2)
BUN: 40 mg/dL — AB (ref 6–20)
CHLORIDE: 107 mmol/L (ref 101–111)
CO2: 22 mmol/L (ref 22–32)
CREATININE: 2.29 mg/dL — AB (ref 0.44–1.00)
Calcium: 8.5 mg/dL — ABNORMAL LOW (ref 8.9–10.3)
GFR, EST AFRICAN AMERICAN: 25 mL/min — AB (ref >60)
GFR, EST NON AFRICAN AMERICAN: 21 mL/min — AB (ref >60)
Glucose, Bld: 101 mg/dL — ABNORMAL HIGH (ref 65–99)
POTASSIUM: 4.4 mmol/L (ref 3.5–5.1)
Sodium: 134 mmol/L — ABNORMAL LOW (ref 135–145)
TOTAL PROTEIN: 6.1 g/dL — AB (ref 6.5–8.1)

## 2015-10-09 LAB — SAMPLE TO BLOOD BANK

## 2015-10-09 MED ORDER — EPOETIN ALFA 40000 UNIT/ML IJ SOLN
40000.0000 [IU] | Freq: Once | INTRAMUSCULAR | Status: AC
  Administered 2015-10-09: 40000 [IU] via SUBCUTANEOUS
  Filled 2015-10-09: qty 1

## 2015-10-09 NOTE — Progress Notes (Signed)
[name] OFFICE PROGRESS NOTE  Patient Care Team: Jane Confer, MD as PCP - General (Internal Medicine) Jane Robinette Haines, MD (General Surgery) Jane Battiest, NP as Nurse Practitioner (Gerontology)   SUMMARY OF ONCOLOGIC HISTORY:  # Severe Anemia- multifactorial [as per Pt baseline 6-6.5] SCD/CKD- on procrit 40K/IV Iron; May 2017- Procrit 40K W-F   # SICKLE CELL ANEMIA/ Congestive hepatopathy [Dec 2016- Bili 31s s/p liver bx; [facility]]; May 2017- RestartHydrea 500 qD  # Acute renal failure/ CKD [Dr.Kolluru]/ CHF [Dr.]  # Jan 2017 ? Endocarditis s/p IV Abx/ A. Fib [not on anti-coag]   INTERVAL HISTORY:  65 year old female patient with above history of sickle cell anemia chronic kidney disease/CHF- is here for follow-up.  Patient has been needing at least 2 units of blood each week for hemoglobin around 4. Her last transfusion was approximately 1 week ago.  She has a skin rash mostly on her legs and arms. Improved with Benadryl.   Patient complains of fatigue. And also shortness of breath with exertion. She is able to lie on her back. No unusual cough.  Leg swellings not any worse.  She complains of pain in her bilateral wrists for the last few days. Get worse with movement. Otherwise no fever no chills. She did not finish her antibiotics as recommended at last visit. No nausea no vomiting.  REVIEW OF SYSTEMS:  A complete 10 point review of system is done which is negative except mentioned above/history of present illness.   PAST MEDICAL HISTORY :  Past Medical History  Diagnosis Date  . Sickle cell anemia (HCC)     sickle cell disease  . Vitamin D deficiency   . Hypertension   . Osteoporosis 2011    osteopenia  . 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 Cheree [facility] at [facility]  . Gout   . Sickle cell anemia (HCC)   . Lichen planus   . SCA-1 (spinocerebellar ataxia type 1) ([place])   . COPD (chronic obstructive pulmonary disease) (HCC)     symptoms Dr.  Patricia Short   . CHF (congestive heart failure) ([place])   . Congestive heart failure (CHF) ([place])   . Congestive heart failure (CHF) ([place]) 06/2015  . Anemia   . Thrombocytopenia ([place])     PAST SURGICAL HISTORY :   Past Surgical History  Procedure Laterality Date  . Tubal ligation    . Gallbladder surgery    . Colonoscopy  2014    [facility]  . Peripheral vascular catheterization Left 06/14/2015    Procedure: Dialysis/Perma Catheter Insertion;  Surgeon: Jane Cabal, MD;  Location: [place] [facility];  Service: Cardiovascular;  Laterality: Left;    FAMILY HISTORY :   Family History  Problem Relation Age of Onset  . Heart disease Father   . Diabetes Father   . Diabetes Sister   . Cancer Mother     breast cancer  . Cancer Maternal Aunt     Breast cancer    SOCIAL HISTORY:   Social History  Substance Use Topics  . Smoking status: Former Smoker -- 0.25 packs/day for 30 years    Types: Cigarettes    Quit date: 04/29/2015  . Smokeless tobacco: Never Used     Comment: quit 1 month ago  . Alcohol Use: No    ALLERGIES:  is allergic to ramipril.  MEDICATIONS:  Current Outpatient Prescriptions  Medication Sig Dispense Refill  . albuterol (PROVENTIL HFA;VENTOLIN HFA) 108 (90 Base)  MCG/ACT inhaler Inhale 2 puffs into the lungs every 4 (four) hours as needed for wheezing or shortness of breath. 1 Inhaler 2  . calcitRIOL (ROCALTROL) 0.25 MCG capsule Take 0.25 mcg by mouth daily.    . folic acid (FOLVITE) 1 MG tablet Take 1 mg by mouth daily.    . furosemide (LASIX) 80 MG tablet Take 1 tablet (80 mg total) by mouth 2 (two) times daily. 30 tablet 0  . mometasone-formoterol (DULERA) 200-5 MCG/ACT AERO Inhale 2 puffs into the lungs 2 (two) times daily. 1 Inhaler 12  . sodium bicarbonate 650 MG tablet Take by mouth.    . zaleplon (SONATA) 5 MG capsule Reported on 10/02/2015  3  . amiodarone (PACERONE) 200 MG tablet Take 200 mg by mouth daily.     No current facility-administered  medications for this visit.   Facility-Administered Medications Ordered in Other Visits  Medication Dose Route Frequency Provider Last Rate Last Dose  . heparin lock flush 100 unit/mL  250 Units Intracatheter PRN Jane Alf, MD        PHYSICAL EXAMINATION:   BP 118/59 mmHg  Pulse 60  Temp(Src) 97.4 F (36.3 C) (Tympanic)  Resp 14  Wt 179 lb 14.3 oz (81.6 kg)  Filed Weights   10/09/15 0932  Weight: 179 lb 14.3 oz (81.6 kg)    GENERAL: Well-nourished well-developed; Alert, no distress and comfortable.  With her husband.She is in a wheelchair. EYES: positive for pallor/no icterus.  OROPHARYNX: no thrush or ulceration; good dentition  NECK: supple, no masses felt LYMPH:  no palpable lymphadenopathy in the cervical, axillary or inguinal regions LUNGS: clear to auscultation and  No wheeze or crackles HEART/CVS: regular rate & rhythm and no murmurs; No positive lower extremity edema ABDOMEN:abdomen soft, non-tender and normal bowel sounds Musculoskeletal:no cyanosis of digits and no clubbing; she has mild swelling in bilateral wrist/tenderness on palpation and movement. PSYCH: alert & oriented x 3 with fluent speech NEURO: no focal motor/sensory deficits SKIN: Maculopapular Papular skin rash mostly in her legs. No blanching. No rash anywhere else.  LABORATORY DATA:  I have reviewed the data as listed    Component Value Date/Time   NA 134* 10/09/2015 0914   NA 138 06/20/2012 1400   NA 141 02/20/2010   K 4.4 10/09/2015 0914   K 4.3 06/20/2012 1400   CL 107 10/09/2015 0914   CL 105 06/20/2012 1400   CO2 22 10/09/2015 0914   CO2 24 06/20/2012 1400   GLUCOSE 101* 10/09/2015 0914   GLUCOSE 102* 06/20/2012 1400   BUN 40* 10/09/2015 0914   BUN 13 06/20/2012 1400   BUN 14 02/20/2010   CREATININE 2.29* 10/09/2015 0914   CREATININE 1.36* 03/22/2013 1522   CREATININE 1.1 02/20/2010   CALCIUM 8.5* 10/09/2015 0914   CALCIUM 9.4 06/20/2012 1400   PROT 6.1* 10/09/2015 0914    PROT 7.1 03/22/2013 1522   ALBUMIN 3.0* 10/09/2015 0914   ALBUMIN 3.7 03/22/2013 1522   AST 31 10/09/2015 0914   AST 48* 03/22/2013 1522   ALT 22 10/09/2015 0914   ALT 25 03/22/2013 1522   ALKPHOS 207* 10/09/2015 0914   ALKPHOS 139* 03/22/2013 1522   BILITOT 0.9 10/09/2015 0914   BILITOT 4.0* 03/22/2013 1522   GFRNONAA 21* 10/09/2015 0914   GFRNONAA 42* 03/22/2013 1522   GFRAA 25* 10/09/2015 0914   GFRAA 48* 03/22/2013 1522    No results found for: SPEP, UPEP  Lab Results  Component Value Date   WBC PENDING  10/09/2015   NEUTROABS PENDING 10/09/2015   HGB 5.2* 10/09/2015   HCT 16.6* 10/09/2015   MCV 70.8* 10/09/2015   PLT 422 10/09/2015      Chemistry      Component Value Date/Time   NA 134* 10/09/2015 0914   NA 138 06/20/2012 1400   NA 141 02/20/2010   K 4.4 10/09/2015 0914   K 4.3 06/20/2012 1400   CL 107 10/09/2015 0914   CL 105 06/20/2012 1400   CO2 22 10/09/2015 0914   CO2 24 06/20/2012 1400   BUN 40* 10/09/2015 0914   BUN 13 06/20/2012 1400   BUN 14 02/20/2010   CREATININE 2.29* 10/09/2015 0914   CREATININE 1.36* 03/22/2013 1522   CREATININE 1.1 02/20/2010   GLU 89 02/20/2010      Component Value Date/Time   CALCIUM 8.5* 10/09/2015 0914   CALCIUM 9.4 06/20/2012 1400   ALKPHOS 207* 10/09/2015 0914   ALKPHOS 139* 03/22/2013 1522   AST 31 10/09/2015 0914   AST 48* 03/22/2013 1522   ALT 22 10/09/2015 0914   ALT 25 03/22/2013 1522   BILITOT 0.9 10/09/2015 0914   BILITOT 4.0* 03/22/2013 1522         ASSESSMENT & PLAN:   # SEVERE ANEMIA-Multifactorial chronic kidney disease/iron deficiency/sickle cell disease. Patient has adequate reticulocyte count for the degree of anemia which explains brisk response of the bone marrow to the anemia.  # Severe anemia hemoglobin today is 5. Recommend holding transfusion today. Patient not very symptomatic. We will increase the dose of Procrit 40,000 units to twice weekly-Wednesday Friday. [Twice a week]. Given the  poor response recommend- IV iron. Recent iron studies saturation 6%. Ferritin not a good indicator.   # Sickle cell disease- confirmed on hemoglobin electrophoresis. On folic acid once a day. Recommend Hydrea 500 mg once a day.  #  Chronic kidney disease/ acute renal failure- today creatinine is 2.9.   #  Congestive hepatopathy- bilirubin improving; normal at 0.9.   # Skin rash- question reaction to blood recommend Benadryl.    # swelling in joints of hand-recommend keflex; also recommend Rheumatology evaluation.   #  Patient will follow-up with me in approximately  4 weeks;  Continue Procrit  twice weekly also IV iron monthly basis.    Cammie Sickle, MD 10/09/2015 10:06 AM

## 2015-10-09 NOTE — Progress Notes (Signed)
Patient felt like she was getting stronger until last night when she started feeling weak with chills.  After her last blood transfusion she has a rash that is itchy and she has been taking benadryl.

## 2015-10-10 ENCOUNTER — Inpatient Hospital Stay: Payer: BLUE CROSS/BLUE SHIELD

## 2015-10-10 ENCOUNTER — Other Ambulatory Visit: Payer: Self-pay | Admitting: Internal Medicine

## 2015-10-10 VITALS — BP 100/61 | HR 81 | Temp 97.7°F | Resp 18

## 2015-10-10 DIAGNOSIS — N189 Chronic kidney disease, unspecified: Principal | ICD-10-CM

## 2015-10-10 DIAGNOSIS — D571 Sickle-cell disease without crisis: Secondary | ICD-10-CM | POA: Diagnosis not present

## 2015-10-10 DIAGNOSIS — D631 Anemia in chronic kidney disease: Secondary | ICD-10-CM

## 2015-10-10 MED ORDER — SODIUM CHLORIDE 0.9 % IV SOLN
Freq: Once | INTRAVENOUS | Status: AC
Start: 2015-10-10 — End: 2015-10-10
  Administered 2015-10-10: 14:00:00 via INTRAVENOUS
  Filled 2015-10-10: qty 1000

## 2015-10-10 MED ORDER — HYDROXYUREA 500 MG PO CAPS: 500.0000 mg | ORAL_CAPSULE | Freq: Every day | ORAL | Status: DC

## 2015-10-10 MED ORDER — SODIUM CHLORIDE 0.9 % IV SOLN
510.0000 mg | Freq: Once | INTRAVENOUS | Status: AC
  Administered 2015-10-10: 510 mg via INTRAVENOUS
  Filled 2015-10-10: qty 17

## 2015-10-11 ENCOUNTER — Other Ambulatory Visit: Payer: Self-pay | Admitting: Internal Medicine

## 2015-10-11 ENCOUNTER — Inpatient Hospital Stay: Payer: BLUE CROSS/BLUE SHIELD

## 2015-10-11 ENCOUNTER — Ambulatory Visit
Admission: RE | Admit: 2015-10-11 | Discharge: 2015-10-11 | Disposition: A | Discharge status: Home / Self Care | Payer: BLUE CROSS/BLUE SHIELD | Source: Ambulatory Visit | Attending: Internal Medicine | Admitting: Internal Medicine

## 2015-10-11 ENCOUNTER — Telehealth: Payer: Self-pay | Admitting: *Deleted

## 2015-10-11 VITALS — BP 121/48 | HR 70 | Temp 98.2°F | Resp 20 | Ht 67.0 in | Wt 179.0 lb

## 2015-10-11 VITALS — BP 117/62 | HR 66 | Resp 20

## 2015-10-11 DIAGNOSIS — D571 Sickle-cell disease without crisis: Secondary | ICD-10-CM | POA: Diagnosis not present

## 2015-10-11 DIAGNOSIS — N183 Chronic kidney disease, stage 3 unspecified: Secondary | ICD-10-CM

## 2015-10-11 DIAGNOSIS — D631 Anemia in chronic kidney disease: Secondary | ICD-10-CM | POA: Insufficient documentation

## 2015-10-11 DIAGNOSIS — N189 Chronic kidney disease, unspecified: Principal | ICD-10-CM

## 2015-10-11 LAB — PREPARE RBC (CROSSMATCH)

## 2015-10-11 LAB — SAMPLE TO BLOOD BANK

## 2015-10-11 LAB — HEMATOCRIT: HCT: 15.1 % — ABNORMAL LOW (ref 35.0–47.0)

## 2015-10-11 LAB — HEMOGLOBIN: Hemoglobin: 4.7 g/dL — CL (ref 12.0–16.0)

## 2015-10-11 MED ORDER — ACETAMINOPHEN 325 MG PO TABS
ORAL_TABLET | ORAL | Status: AC
  Filled 2015-10-11: qty 2

## 2015-10-11 MED ORDER — DIPHENHYDRAMINE HCL 25 MG PO CAPS
25.0000 mg | ORAL_CAPSULE | Freq: Once | ORAL | Status: AC
  Administered 2015-10-11: 25 mg via ORAL

## 2015-10-11 MED ORDER — FUROSEMIDE 10 MG/ML IJ SOLN
20.0000 mg | Freq: Once | INTRAMUSCULAR | Status: AC
  Administered 2015-10-11: 20 mg via INTRAVENOUS

## 2015-10-11 MED ORDER — DIPHENHYDRAMINE HCL 25 MG PO CAPS
ORAL_CAPSULE | ORAL | Status: AC
  Filled 2015-10-11: qty 1

## 2015-10-11 MED ORDER — FUROSEMIDE 10 MG/ML IJ SOLN
INTRAMUSCULAR | Status: AC
Start: 2015-10-11 — End: 2015-10-12
  Filled 2015-10-11: qty 2

## 2015-10-11 MED ORDER — ACETAMINOPHEN 325 MG PO TABS
650.0000 mg | ORAL_TABLET | Freq: Once | ORAL | Status: AC
  Administered 2015-10-11: 650 mg via ORAL

## 2015-10-11 MED ORDER — SODIUM CHLORIDE 0.9 % IV SOLN
250.0000 mL | Freq: Once | INTRAVENOUS | Status: DC
Start: 2015-10-11 — End: 2015-10-12

## 2015-10-11 MED ORDER — EPOETIN ALFA 40000 UNIT/ML IJ SOLN
40000.0000 [IU] | Freq: Once | INTRAMUSCULAR | Status: AC
  Administered 2015-10-11: 40000 [IU] via SUBCUTANEOUS
  Filled 2015-10-11: qty 1

## 2015-10-11 NOTE — Telephone Encounter (Signed)
Critical Hgb - 4.7  MD notified.

## 2015-10-12 LAB — TYPE AND SCREEN
ABO/RH(D): O POS
Antibody Screen: NEGATIVE
UNIT DIVISION: 0
Unit division: 0

## 2015-10-16 ENCOUNTER — Inpatient Hospital Stay: Payer: BLUE CROSS/BLUE SHIELD

## 2015-10-16 ENCOUNTER — Other Ambulatory Visit: Payer: Self-pay | Admitting: Internal Medicine

## 2015-10-16 VITALS — BP 123/68 | HR 70 | Resp 20

## 2015-10-16 DIAGNOSIS — N183 Chronic kidney disease, stage 3 unspecified: Secondary | ICD-10-CM

## 2015-10-16 DIAGNOSIS — D571 Sickle-cell disease without crisis: Secondary | ICD-10-CM | POA: Diagnosis not present

## 2015-10-16 DIAGNOSIS — N189 Chronic kidney disease, unspecified: Principal | ICD-10-CM

## 2015-10-16 DIAGNOSIS — D631 Anemia in chronic kidney disease: Secondary | ICD-10-CM

## 2015-10-16 LAB — HEMOGLOBIN: HEMOGLOBIN: 7.1 g/dL — AB (ref 12.0–16.0)

## 2015-10-16 LAB — SAMPLE TO BLOOD BANK

## 2015-10-16 LAB — HEMATOCRIT: HCT: 21.9 % — ABNORMAL LOW (ref 35.0–47.0)

## 2015-10-16 MED ORDER — EPOETIN ALFA 40000 UNIT/ML IJ SOLN
40000.0000 [IU] | Freq: Once | INTRAMUSCULAR | Status: AC
  Administered 2015-10-16: 40000 [IU] via SUBCUTANEOUS
  Filled 2015-10-16: qty 1

## 2015-10-18 ENCOUNTER — Other Ambulatory Visit: Payer: Self-pay | Admitting: Internal Medicine

## 2015-10-18 ENCOUNTER — Inpatient Hospital Stay: Payer: BLUE CROSS/BLUE SHIELD

## 2015-10-18 VITALS — BP 154/70 | HR 61 | Temp 96.4°F | Resp 20

## 2015-10-18 DIAGNOSIS — D571 Sickle-cell disease without crisis: Secondary | ICD-10-CM

## 2015-10-18 DIAGNOSIS — N189 Chronic kidney disease, unspecified: Principal | ICD-10-CM

## 2015-10-18 DIAGNOSIS — D631 Anemia in chronic kidney disease: Secondary | ICD-10-CM

## 2015-10-18 DIAGNOSIS — N183 Chronic kidney disease, stage 3 unspecified: Secondary | ICD-10-CM

## 2015-10-18 LAB — HEMATOCRIT: HEMATOCRIT: 23.5 % — AB (ref 35.0–47.0)

## 2015-10-18 LAB — SAMPLE TO BLOOD BANK

## 2015-10-18 LAB — HEMOGLOBIN: HEMOGLOBIN: 7.5 g/dL — AB (ref 12.0–16.0)

## 2015-10-18 MED ORDER — EPOETIN ALFA 40000 UNIT/ML IJ SOLN
40000.0000 [IU] | Freq: Once | INTRAMUSCULAR | Status: AC
  Administered 2015-10-18: 40000 [IU] via SUBCUTANEOUS
  Filled 2015-10-18: qty 1

## 2015-10-21 ENCOUNTER — Inpatient Hospital Stay: Payer: BLUE CROSS/BLUE SHIELD

## 2015-10-21 VITALS — BP 111/61 | HR 58 | Temp 98.7°F | Resp 20

## 2015-10-21 DIAGNOSIS — D631 Anemia in chronic kidney disease: Secondary | ICD-10-CM

## 2015-10-21 DIAGNOSIS — N189 Chronic kidney disease, unspecified: Principal | ICD-10-CM

## 2015-10-21 DIAGNOSIS — D571 Sickle-cell disease without crisis: Secondary | ICD-10-CM | POA: Diagnosis not present

## 2015-10-21 MED ORDER — SODIUM CHLORIDE 0.9 % IV SOLN
510.0000 mg | Freq: Once | INTRAVENOUS | Status: AC
  Administered 2015-10-21: 510 mg via INTRAVENOUS
  Filled 2015-10-21: qty 17

## 2015-10-21 MED ORDER — SODIUM CHLORIDE 0.9 % IV SOLN
INTRAVENOUS | Status: DC
  Administered 2015-10-21: 14:00:00 via INTRAVENOUS
  Filled 2015-10-21: qty 1000

## 2015-10-23 ENCOUNTER — Inpatient Hospital Stay: Payer: BLUE CROSS/BLUE SHIELD

## 2015-10-23 DIAGNOSIS — D571 Sickle-cell disease without crisis: Secondary | ICD-10-CM | POA: Diagnosis not present

## 2015-10-23 DIAGNOSIS — N183 Chronic kidney disease, stage 3 unspecified: Secondary | ICD-10-CM

## 2015-10-23 DIAGNOSIS — N189 Chronic kidney disease, unspecified: Principal | ICD-10-CM

## 2015-10-23 DIAGNOSIS — D631 Anemia in chronic kidney disease: Secondary | ICD-10-CM

## 2015-10-23 LAB — SAMPLE TO BLOOD BANK

## 2015-10-23 LAB — HEMATOCRIT: HCT: 20.4 % — ABNORMAL LOW (ref 35.0–47.0)

## 2015-10-23 LAB — HEMOGLOBIN: Hemoglobin: 6.6 g/dL — ABNORMAL LOW (ref 12.0–16.0)

## 2015-10-23 MED ORDER — EPOETIN ALFA 40000 UNIT/ML IJ SOLN
40000.0000 [IU] | Freq: Once | INTRAMUSCULAR | Status: AC
  Administered 2015-10-23: 40000 [IU] via SUBCUTANEOUS
  Filled 2015-10-23: qty 1

## 2015-10-25 ENCOUNTER — Inpatient Hospital Stay: Payer: BLUE CROSS/BLUE SHIELD

## 2015-10-25 ENCOUNTER — Ambulatory Visit: Payer: BLUE CROSS/BLUE SHIELD

## 2015-10-25 VITALS — BP 145/70 | HR 66 | Temp 98.0°F | Resp 20

## 2015-10-25 DIAGNOSIS — N189 Chronic kidney disease, unspecified: Principal | ICD-10-CM

## 2015-10-25 DIAGNOSIS — N183 Chronic kidney disease, stage 3 unspecified: Secondary | ICD-10-CM

## 2015-10-25 DIAGNOSIS — D571 Sickle-cell disease without crisis: Secondary | ICD-10-CM

## 2015-10-25 DIAGNOSIS — D631 Anemia in chronic kidney disease: Secondary | ICD-10-CM

## 2015-10-25 LAB — SAMPLE TO BLOOD BANK

## 2015-10-25 LAB — HEMOGLOBIN: HEMOGLOBIN: 6.7 g/dL — AB (ref 12.0–16.0)

## 2015-10-25 LAB — HEMATOCRIT: HEMATOCRIT: 20.6 % — AB (ref 35.0–47.0)

## 2015-10-25 MED ORDER — EPOETIN ALFA 40000 UNIT/ML IJ SOLN
40000.0000 [IU] | Freq: Once | INTRAMUSCULAR | Status: AC
  Administered 2015-10-25: 40000 [IU] via SUBCUTANEOUS
  Filled 2015-10-25: qty 1

## 2015-10-29 ENCOUNTER — Ambulatory Visit: Payer: BLUE CROSS/BLUE SHIELD | Admitting: Internal Medicine

## 2015-10-30 ENCOUNTER — Inpatient Hospital Stay: Payer: BLUE CROSS/BLUE SHIELD

## 2015-10-30 VITALS — BP 141/68 | HR 66 | Temp 96.3°F

## 2015-10-30 DIAGNOSIS — D571 Sickle-cell disease without crisis: Secondary | ICD-10-CM | POA: Diagnosis not present

## 2015-10-30 DIAGNOSIS — N189 Chronic kidney disease, unspecified: Principal | ICD-10-CM

## 2015-10-30 DIAGNOSIS — D631 Anemia in chronic kidney disease: Secondary | ICD-10-CM

## 2015-10-30 DIAGNOSIS — N183 Chronic kidney disease, stage 3 unspecified: Secondary | ICD-10-CM

## 2015-10-30 LAB — HEMATOCRIT: HEMATOCRIT: 19.9 % — AB (ref 35.0–47.0)

## 2015-10-30 LAB — SAMPLE TO BLOOD BANK

## 2015-10-30 LAB — HEMOGLOBIN: Hemoglobin: 6.7 g/dL — ABNORMAL LOW (ref 12.0–16.0)

## 2015-10-30 MED ORDER — EPOETIN ALFA 40000 UNIT/ML IJ SOLN
40000.0000 [IU] | Freq: Once | INTRAMUSCULAR | Status: AC
  Administered 2015-10-30: 40000 [IU] via SUBCUTANEOUS

## 2015-10-30 NOTE — Progress Notes (Signed)
Reviewed labs and treatment plan with Dr. Rogue Bussing.  Patient to receive procrit today, no iron on blood infusion today.   Will return for repeat labs next  Monday.

## 2015-11-01 ENCOUNTER — Inpatient Hospital Stay: Payer: BLUE CROSS/BLUE SHIELD

## 2015-11-01 ENCOUNTER — Inpatient Hospital Stay: Payer: BLUE CROSS/BLUE SHIELD | Attending: Internal Medicine

## 2015-11-01 VITALS — BP 117/57 | HR 73 | Resp 20

## 2015-11-01 DIAGNOSIS — D571 Sickle-cell disease without crisis: Secondary | ICD-10-CM | POA: Diagnosis not present

## 2015-11-01 DIAGNOSIS — E559 Vitamin D deficiency, unspecified: Secondary | ICD-10-CM | POA: Insufficient documentation

## 2015-11-01 DIAGNOSIS — M818 Other osteoporosis without current pathological fracture: Secondary | ICD-10-CM | POA: Diagnosis not present

## 2015-11-01 DIAGNOSIS — I4891 Unspecified atrial fibrillation: Secondary | ICD-10-CM | POA: Insufficient documentation

## 2015-11-01 DIAGNOSIS — J449 Chronic obstructive pulmonary disease, unspecified: Secondary | ICD-10-CM | POA: Diagnosis not present

## 2015-11-01 DIAGNOSIS — Z87891 Personal history of nicotine dependence: Secondary | ICD-10-CM | POA: Diagnosis not present

## 2015-11-01 DIAGNOSIS — M109 Gout, unspecified: Secondary | ICD-10-CM | POA: Diagnosis not present

## 2015-11-01 DIAGNOSIS — D631 Anemia in chronic kidney disease: Secondary | ICD-10-CM

## 2015-11-01 DIAGNOSIS — I509 Heart failure, unspecified: Secondary | ICD-10-CM | POA: Diagnosis not present

## 2015-11-01 DIAGNOSIS — G111 Early-onset cerebellar ataxia: Secondary | ICD-10-CM | POA: Diagnosis not present

## 2015-11-01 DIAGNOSIS — D151 Benign neoplasm of heart: Secondary | ICD-10-CM | POA: Diagnosis not present

## 2015-11-01 DIAGNOSIS — N189 Chronic kidney disease, unspecified: Principal | ICD-10-CM

## 2015-11-01 DIAGNOSIS — Z803 Family history of malignant neoplasm of breast: Secondary | ICD-10-CM | POA: Insufficient documentation

## 2015-11-01 DIAGNOSIS — D696 Thrombocytopenia, unspecified: Secondary | ICD-10-CM | POA: Insufficient documentation

## 2015-11-01 DIAGNOSIS — I129 Hypertensive chronic kidney disease with stage 1 through stage 4 chronic kidney disease, or unspecified chronic kidney disease: Secondary | ICD-10-CM | POA: Diagnosis not present

## 2015-11-01 DIAGNOSIS — D638 Anemia in other chronic diseases classified elsewhere: Secondary | ICD-10-CM | POA: Diagnosis present

## 2015-11-01 DIAGNOSIS — K769 Liver disease, unspecified: Secondary | ICD-10-CM | POA: Insufficient documentation

## 2015-11-01 DIAGNOSIS — N183 Chronic kidney disease, stage 3 unspecified: Secondary | ICD-10-CM

## 2015-11-01 DIAGNOSIS — L439 Lichen planus, unspecified: Secondary | ICD-10-CM | POA: Diagnosis not present

## 2015-11-01 DIAGNOSIS — Z79899 Other long term (current) drug therapy: Secondary | ICD-10-CM | POA: Insufficient documentation

## 2015-11-01 LAB — SAMPLE TO BLOOD BANK

## 2015-11-01 LAB — HEMATOCRIT: HEMATOCRIT: 18.7 % — AB (ref 35.0–47.0)

## 2015-11-01 LAB — HEMOGLOBIN: HEMOGLOBIN: 6.3 g/dL — AB (ref 12.0–16.0)

## 2015-11-01 MED ORDER — EPOETIN ALFA 40000 UNIT/ML IJ SOLN
40000.0000 [IU] | Freq: Once | INTRAMUSCULAR | Status: AC
  Administered 2015-11-01: 40000 [IU] via SUBCUTANEOUS
  Filled 2015-11-01: qty 1

## 2015-11-04 ENCOUNTER — Inpatient Hospital Stay: Payer: BLUE CROSS/BLUE SHIELD

## 2015-11-04 VITALS — BP 128/67 | HR 63 | Temp 96.7°F | Resp 18

## 2015-11-04 DIAGNOSIS — D638 Anemia in other chronic diseases classified elsewhere: Secondary | ICD-10-CM | POA: Diagnosis not present

## 2015-11-04 DIAGNOSIS — D631 Anemia in chronic kidney disease: Secondary | ICD-10-CM

## 2015-11-04 DIAGNOSIS — N189 Chronic kidney disease, unspecified: Principal | ICD-10-CM

## 2015-11-04 MED ORDER — SODIUM CHLORIDE 0.9 % IV SOLN
510.0000 mg | Freq: Once | INTRAVENOUS | Status: AC
  Administered 2015-11-04: 510 mg via INTRAVENOUS
  Filled 2015-11-04: qty 17

## 2015-11-06 ENCOUNTER — Inpatient Hospital Stay (HOSPITAL_BASED_OUTPATIENT_CLINIC_OR_DEPARTMENT_OTHER): Payer: BLUE CROSS/BLUE SHIELD | Admitting: Internal Medicine

## 2015-11-06 ENCOUNTER — Inpatient Hospital Stay: Payer: BLUE CROSS/BLUE SHIELD

## 2015-11-06 ENCOUNTER — Encounter: Payer: Self-pay | Admitting: Internal Medicine

## 2015-11-06 VITALS — BP 133/53 | HR 66 | Temp 98.9°F | Resp 16 | Ht 67.0 in | Wt 184.5 lb

## 2015-11-06 DIAGNOSIS — D571 Sickle-cell disease without crisis: Secondary | ICD-10-CM

## 2015-11-06 DIAGNOSIS — N189 Chronic kidney disease, unspecified: Secondary | ICD-10-CM

## 2015-11-06 DIAGNOSIS — G111 Early-onset cerebellar ataxia: Secondary | ICD-10-CM

## 2015-11-06 DIAGNOSIS — D5700 Hb-SS disease with crisis, unspecified: Secondary | ICD-10-CM

## 2015-11-06 DIAGNOSIS — N2581 Secondary hyperparathyroidism of renal origin: Secondary | ICD-10-CM

## 2015-11-06 DIAGNOSIS — N183 Chronic kidney disease, stage 3 unspecified: Secondary | ICD-10-CM

## 2015-11-06 DIAGNOSIS — N2589 Other disorders resulting from impaired renal tubular function: Secondary | ICD-10-CM

## 2015-11-06 DIAGNOSIS — D151 Benign neoplasm of heart: Secondary | ICD-10-CM

## 2015-11-06 DIAGNOSIS — K769 Liver disease, unspecified: Secondary | ICD-10-CM

## 2015-11-06 DIAGNOSIS — I129 Hypertensive chronic kidney disease with stage 1 through stage 4 chronic kidney disease, or unspecified chronic kidney disease: Secondary | ICD-10-CM | POA: Diagnosis not present

## 2015-11-06 DIAGNOSIS — D696 Thrombocytopenia, unspecified: Secondary | ICD-10-CM

## 2015-11-06 DIAGNOSIS — M109 Gout, unspecified: Secondary | ICD-10-CM

## 2015-11-06 DIAGNOSIS — Z87891 Personal history of nicotine dependence: Secondary | ICD-10-CM

## 2015-11-06 DIAGNOSIS — M818 Other osteoporosis without current pathological fracture: Secondary | ICD-10-CM

## 2015-11-06 DIAGNOSIS — L439 Lichen planus, unspecified: Secondary | ICD-10-CM

## 2015-11-06 DIAGNOSIS — I4891 Unspecified atrial fibrillation: Secondary | ICD-10-CM

## 2015-11-06 DIAGNOSIS — Z803 Family history of malignant neoplasm of breast: Secondary | ICD-10-CM

## 2015-11-06 DIAGNOSIS — I509 Heart failure, unspecified: Secondary | ICD-10-CM

## 2015-11-06 DIAGNOSIS — I10 Essential (primary) hypertension: Secondary | ICD-10-CM

## 2015-11-06 DIAGNOSIS — J449 Chronic obstructive pulmonary disease, unspecified: Secondary | ICD-10-CM

## 2015-11-06 DIAGNOSIS — D631 Anemia in chronic kidney disease: Secondary | ICD-10-CM

## 2015-11-06 DIAGNOSIS — Z79899 Other long term (current) drug therapy: Secondary | ICD-10-CM

## 2015-11-06 DIAGNOSIS — E559 Vitamin D deficiency, unspecified: Secondary | ICD-10-CM

## 2015-11-06 DIAGNOSIS — D638 Anemia in other chronic diseases classified elsewhere: Secondary | ICD-10-CM | POA: Diagnosis not present

## 2015-11-06 DIAGNOSIS — D5709 Hb-ss disease with crisis with other specified complication: Secondary | ICD-10-CM

## 2015-11-06 DIAGNOSIS — N08 Glomerular disorders in diseases classified elsewhere: Secondary | ICD-10-CM

## 2015-11-06 LAB — CBC WITH DIFFERENTIAL/PLATELET
BASOS ABS: 0.1 10*3/uL (ref 0–0.1)
BASOS PCT: 1 %
Band Neutrophils: 1 %
EOS ABS: 0.1 10*3/uL (ref 0–0.7)
Eosinophils Relative: 1 %
HCT: 18.3 % — ABNORMAL LOW (ref 35.0–47.0)
Hemoglobin: 6.3 g/dL — ABNORMAL LOW (ref 12.0–16.0)
Lymphocytes Relative: 17 %
Lymphs Abs: 1.7 10*3/uL (ref 1.0–3.6)
MCH: 30.1 pg (ref 26.0–34.0)
MCHC: 34.5 g/dL (ref 32.0–36.0)
MCV: 87.3 fL (ref 80.0–100.0)
Monocytes Absolute: 1.2 10*3/uL — ABNORMAL HIGH (ref 0.2–0.9)
Monocytes Relative: 12 %
NEUTROS ABS: 7 10*3/uL — AB (ref 1.4–6.5)
NEUTROS PCT: 68 %
Platelets: 290 10*3/uL (ref 150–440)
RBC: 2.1 MIL/uL — ABNORMAL LOW (ref 3.80–5.20)
Smear Review: ADEQUATE
WBC: 10.1 10*3/uL (ref 3.6–11.0)
nRBC: 20 /100 WBC — ABNORMAL HIGH

## 2015-11-06 LAB — COMPREHENSIVE METABOLIC PANEL
ALT: 25 U/L (ref 14–54)
AST: 49 U/L — AB (ref 15–41)
Albumin: 3.3 g/dL — ABNORMAL LOW (ref 3.5–5.0)
Alkaline Phosphatase: 183 U/L — ABNORMAL HIGH (ref 38–126)
Anion gap: 7 (ref 5–15)
BUN: 44 mg/dL — AB (ref 6–20)
CHLORIDE: 106 mmol/L (ref 101–111)
CO2: 24 mmol/L (ref 22–32)
CREATININE: 2.87 mg/dL — AB (ref 0.44–1.00)
Calcium: 8.8 mg/dL — ABNORMAL LOW (ref 8.9–10.3)
GFR calc non Af Amer: 16 mL/min — ABNORMAL LOW (ref >60)
GFR, EST AFRICAN AMERICAN: 19 mL/min — AB (ref >60)
Glucose, Bld: 111 mg/dL — ABNORMAL HIGH (ref 65–99)
POTASSIUM: 4.4 mmol/L (ref 3.5–5.1)
SODIUM: 137 mmol/L (ref 135–145)
Total Bilirubin: 3.9 mg/dL — ABNORMAL HIGH (ref 0.3–1.2)
Total Protein: 6.7 g/dL (ref 6.5–8.1)

## 2015-11-06 LAB — SAMPLE TO BLOOD BANK

## 2015-11-06 MED ORDER — EPOETIN ALFA 40000 UNIT/ML IJ SOLN
40000.0000 [IU] | Freq: Once | INTRAMUSCULAR | Status: AC
  Administered 2015-11-06: 40000 [IU] via SUBCUTANEOUS
  Filled 2015-11-06: qty 1

## 2015-11-06 NOTE — Progress Notes (Signed)
[place] OFFICE PROGRESS NOTE  Patient Care Team: Jackolyn Confer, MD as PCP - General (Internal Medicine) Seeplaputhur Robinette Haines, MD (General Surgery) Rubbie Battiest, NP as Nurse Practitioner (Gerontology)   SUMMARY OF ONCOLOGIC HISTORY:  # Severe Anemia- multifactorial [as per Pt baseline 6-6.5] SCD/CKD- on procrit 40K/IV Iron; May 2017- Procrit 40K W-F   # SICKLE CELL ANEMIA/ Congestive hepatopathy [Dec 2016- Bili 21s s/p liver bx; [facility]]; May 2017- RestartHydrea 500 qD  # Acute renal failure/ CKD [Dr.Kolluru]/ CHF [Dr.]  # Jan 2017 ? Endocarditis s/p IV Abx/ A. Fib [not on anti-coag]   INTERVAL HISTORY:  65 year old female patient with above history of sickle cell anemia chronic kidney disease/CHF- is here for follow-up.  Patient states no worsening fatigue. She has not needed blood transfusion the last 3-4 weeks since we have gone up on Procrit to twice weekly. Her hemoglobin is staying steady around 6. She is needing IV iron once a month.  Improvement of the hand swelling noted. No skin rash. Otherwise no fever no chills.  No nausea no vomiting.  REVIEW OF SYSTEMS:  A complete 10 point review of system is done which is negative except mentioned above/history of present illness.   PAST MEDICAL HISTORY :  Past Medical History  Diagnosis Date  . Sickle cell anemia (HCC)     sickle cell disease  . Vitamin D deficiency   . Hypertension   . Osteoporosis 2011    osteopenia  . 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 Cheree Ditto at [facility]  . Gout   . Sickle cell anemia (HCC)   . Lichen planus   . SCA-1 (spinocerebellar ataxia type 1) ([place])   . COPD (chronic obstructive pulmonary disease) (HCC)     symptoms Dr. Patricia Pesa   . CHF (congestive heart failure) ([place])   . Congestive heart failure (CHF) ([place])   . Congestive heart failure (CHF) ([place]) 06/2015  . Anemia   . Thrombocytopenia ([place])     PAST SURGICAL HISTORY :   Past Surgical History  Procedure  Laterality Date  . Tubal ligation    . Gallbladder surgery    . Colonoscopy  2014    [facility]  . Peripheral vascular catheterization Left 06/14/2015    Procedure: Dialysis/Perma Catheter Insertion;  Surgeon: Katha Cabal, MD;  Location: [place] [facility];  Service: Cardiovascular;  Laterality: Left;    FAMILY HISTORY :   Family History  Problem Relation Age of Onset  . Heart disease Father   . Diabetes Father   . Diabetes Sister   . Cancer Mother     breast cancer  . Cancer Maternal Aunt     Breast cancer    SOCIAL HISTORY:   Social History  Substance Use Topics  . Smoking status: Former Smoker -- 0.25 packs/day for 30 years    Types: Cigarettes    Quit date: 04/29/2015  . Smokeless tobacco: Never Used     Comment: quit 1 month ago  . Alcohol Use: No    ALLERGIES:  is allergic to ramipril.  MEDICATIONS:  Current Outpatient Prescriptions  Medication Sig Dispense Refill  . albuterol (PROVENTIL HFA;VENTOLIN HFA) 108 (90 Base) MCG/ACT inhaler Inhale 2 puffs into the lungs every 4 (four) hours as needed for wheezing or shortness of breath. 1 Inhaler 2  . calcitRIOL (ROCALTROL) 0.25 MCG capsule Take 0.25 mcg by mouth daily.    . folic acid (FOLVITE) 1 MG tablet Take  1 mg by mouth daily.    . furosemide (LASIX) 80 MG tablet Take 1 tablet (80 mg total) by mouth 2 (two) times daily. 30 tablet 0  . hydroxyurea (HYDREA) 500 MG capsule Take 1 capsule (500 mg total) by mouth daily. May take with food to minimize GI side effects. 60 capsule 3  . mometasone-formoterol (DULERA) 200-5 MCG/ACT AERO Inhale 2 puffs into the lungs 2 (two) times daily. 1 Inhaler 12  . sodium bicarbonate 650 MG tablet Take by mouth.    Marland Kitchen amiodarone (PACERONE) 200 MG tablet Take 200 mg by mouth daily.    . zaleplon (SONATA) 5 MG capsule Reported on 11/06/2015  3   No current facility-administered medications for this visit.   Facility-Administered Medications Ordered in Other Visits  Medication Dose Route  Frequency Provider Last Rate Last Dose  . heparin lock flush 100 unit/mL  250 Units Intracatheter PRN Leia Alf, MD        PHYSICAL EXAMINATION:   BP 133/53 mmHg  Pulse 66  Temp(Src) 98.9 F (37.2 C) (Tympanic)  Resp 16  Ht 5\' 7"  (1.702 m)  Wt 184 lb 8.4 oz (83.7 kg)  BMI 28.89 kg/m2  Filed Weights   11/06/15 0858  Weight: 184 lb 8.4 oz (83.7 kg)    GENERAL: Well-nourished well-developed; Alert, no distress and comfortable.  With her husband.She is in a wheelchair. EYES: positive for pallor/no icterus.  OROPHARYNX: no thrush or ulceration; good dentition  NECK: supple, no masses felt LYMPH:  no palpable lymphadenopathy in the cervical, axillary or inguinal regions LUNGS: clear to auscultation and  No wheeze or crackles HEART/CVS: regular rate & rhythm and no murmurs; No positive lower extremity edema ABDOMEN:abdomen soft, non-tender and normal bowel sounds Musculoskeletal:no cyanosis of digits and no clubbing; she has mild swelling in bilateral wrist/tenderness on palpation and movement. PSYCH: alert & oriented x 3 with fluent speech NEURO: no focal motor/sensory deficits SKIN: Maculopapular Papular skin rash mostly in her legs. No blanching. No rash anywhere else.  LABORATORY DATA:  I have reviewed the data as listed    Component Value Date/Time   NA 134* 10/09/2015 0914   NA 138 06/20/2012 1400   NA 141 02/20/2010   K 4.4 10/09/2015 0914   K 4.3 06/20/2012 1400   CL 107 10/09/2015 0914   CL 105 06/20/2012 1400   CO2 22 10/09/2015 0914   CO2 24 06/20/2012 1400   GLUCOSE 101* 10/09/2015 0914   GLUCOSE 102* 06/20/2012 1400   BUN 40* 10/09/2015 0914   BUN 13 06/20/2012 1400   BUN 14 02/20/2010   CREATININE 2.29* 10/09/2015 0914   CREATININE 1.36* 03/22/2013 1522   CREATININE 1.1 02/20/2010   CALCIUM 8.5* 10/09/2015 0914   CALCIUM 9.4 06/20/2012 1400   PROT 6.1* 10/09/2015 0914   PROT 7.1 03/22/2013 1522   ALBUMIN 3.0* 10/09/2015 0914   ALBUMIN 3.7  03/22/2013 1522   AST 31 10/09/2015 0914   AST 48* 03/22/2013 1522   ALT 22 10/09/2015 0914   ALT 25 03/22/2013 1522   ALKPHOS 207* 10/09/2015 0914   ALKPHOS 139* 03/22/2013 1522   BILITOT 0.9 10/09/2015 0914   BILITOT 4.0* 03/22/2013 1522   GFRNONAA 21* 10/09/2015 0914   GFRNONAA 42* 03/22/2013 1522   GFRAA 25* 10/09/2015 0914   GFRAA 48* 03/22/2013 1522    No results found for: SPEP, UPEP  Lab Results  Component Value Date   WBC 8.8 10/09/2015   NEUTROABS 6.1 10/09/2015   HGB  6.3* 11/01/2015   HCT 18.7* 11/01/2015   MCV 70.8* 10/09/2015   PLT 422 10/09/2015      Chemistry      Component Value Date/Time   NA 134* 10/09/2015 0914   NA 138 06/20/2012 1400   NA 141 02/20/2010   K 4.4 10/09/2015 0914   K 4.3 06/20/2012 1400   CL 107 10/09/2015 0914   CL 105 06/20/2012 1400   CO2 22 10/09/2015 0914   CO2 24 06/20/2012 1400   BUN 40* 10/09/2015 0914   BUN 13 06/20/2012 1400   BUN 14 02/20/2010   CREATININE 2.29* 10/09/2015 0914   CREATININE 1.36* 03/22/2013 1522   CREATININE 1.1 02/20/2010   GLU 89 02/20/2010      Component Value Date/Time   CALCIUM 8.5* 10/09/2015 0914   CALCIUM 9.4 06/20/2012 1400   ALKPHOS 207* 10/09/2015 0914   ALKPHOS 139* 03/22/2013 1522   AST 31 10/09/2015 0914   AST 48* 03/22/2013 1522   ALT 22 10/09/2015 0914   ALT 25 03/22/2013 1522   BILITOT 0.9 10/09/2015 0914   BILITOT 4.0* 03/22/2013 1522         ASSESSMENT & PLAN:   # SEVERE ANEMIA-Multifactorial chronic kidney disease/iron deficiency/sickle cell disease. Patient has adequate reticulocyte count for the degree of anemia which explains brisk response of the bone marrow to the anemia.  # Severe anemia hemoglobin today ~6/ improved from 3 from last few weeks. Continue Procrit 40,000 units Wednesday Friday. Continue IV iron every 4 weeks- patient has good response to IV iron. Ferritin not a good indicator.  # Sickle cell disease- confirmed on hemoglobin electrophoresis. On  folic acid once a day. On  Hydrea 500 mg once a day.  #  Chronic kidney disease/ acute renal failure- today creatinine is 2.9. Will get labs as requested by nephrology.  #   Patient will follow-up with me in approximately  4 weeks;  Continue Procrit  twice weekly also IV iron monthly basis.    Cammie Sickle, MD 11/06/2015 9:10 AM

## 2015-11-08 ENCOUNTER — Inpatient Hospital Stay: Payer: BLUE CROSS/BLUE SHIELD

## 2015-11-08 VITALS — BP 136/57 | HR 69 | Temp 98.6°F

## 2015-11-08 DIAGNOSIS — D631 Anemia in chronic kidney disease: Secondary | ICD-10-CM

## 2015-11-08 DIAGNOSIS — M109 Gout, unspecified: Secondary | ICD-10-CM

## 2015-11-08 DIAGNOSIS — D638 Anemia in other chronic diseases classified elsewhere: Secondary | ICD-10-CM | POA: Diagnosis not present

## 2015-11-08 DIAGNOSIS — N189 Chronic kidney disease, unspecified: Principal | ICD-10-CM

## 2015-11-08 DIAGNOSIS — N08 Glomerular disorders in diseases classified elsewhere: Secondary | ICD-10-CM

## 2015-11-08 DIAGNOSIS — N2581 Secondary hyperparathyroidism of renal origin: Secondary | ICD-10-CM

## 2015-11-08 DIAGNOSIS — N2589 Other disorders resulting from impaired renal tubular function: Secondary | ICD-10-CM

## 2015-11-08 DIAGNOSIS — N183 Chronic kidney disease, stage 3 unspecified: Secondary | ICD-10-CM

## 2015-11-08 DIAGNOSIS — D5700 Hb-SS disease with crisis, unspecified: Secondary | ICD-10-CM

## 2015-11-08 DIAGNOSIS — I10 Essential (primary) hypertension: Secondary | ICD-10-CM

## 2015-11-08 DIAGNOSIS — D5709 Hb-ss disease with crisis with other specified complication: Secondary | ICD-10-CM

## 2015-11-08 LAB — URINALYSIS COMPLETE WITH MICROSCOPIC (ARMC ONLY)
Bacteria, UA: NONE SEEN
Bilirubin Urine: NEGATIVE
GLUCOSE, UA: NEGATIVE mg/dL
HGB URINE DIPSTICK: NEGATIVE
Ketones, ur: NEGATIVE mg/dL
LEUKOCYTES UA: NEGATIVE
NITRITE: NEGATIVE
Protein, ur: NEGATIVE mg/dL
SPECIFIC GRAVITY, URINE: 1.005 (ref 1.005–1.030)
pH: 7 (ref 5.0–8.0)

## 2015-11-08 LAB — SAMPLE TO BLOOD BANK

## 2015-11-08 LAB — PROTEIN / CREATININE RATIO, URINE
CREATININE, URINE: 30 mg/dL
Total Protein, Urine: <6 mg/dL

## 2015-11-08 LAB — HEMATOCRIT: HCT: 18.2 % — ABNORMAL LOW (ref 35.0–47.0)

## 2015-11-08 LAB — HEMOGLOBIN: HEMOGLOBIN: 6.3 g/dL — AB (ref 12.0–16.0)

## 2015-11-08 MED ORDER — EPOETIN ALFA 40000 UNIT/ML IJ SOLN
40000.0000 [IU] | Freq: Once | INTRAMUSCULAR | Status: AC
  Administered 2015-11-08: 40000 [IU] via SUBCUTANEOUS
  Filled 2015-11-08: qty 1

## 2015-11-09 LAB — PTH, INTACT AND CALCIUM
CALCIUM TOTAL (PTH): 9.6 mg/dL (ref 8.7–10.3)
PTH: 243 pg/mL — AB (ref 15–65)

## 2015-11-13 ENCOUNTER — Ambulatory Visit (INDEPENDENT_AMBULATORY_CARE_PROVIDER_SITE_OTHER): Payer: BLUE CROSS/BLUE SHIELD | Admitting: *Deleted

## 2015-11-13 ENCOUNTER — Inpatient Hospital Stay: Payer: BLUE CROSS/BLUE SHIELD

## 2015-11-13 ENCOUNTER — Ambulatory Visit: Payer: BLUE CROSS/BLUE SHIELD | Admitting: *Deleted

## 2015-11-13 VITALS — BP 138/54 | HR 64

## 2015-11-13 DIAGNOSIS — N189 Chronic kidney disease, unspecified: Secondary | ICD-10-CM

## 2015-11-13 DIAGNOSIS — I10 Essential (primary) hypertension: Secondary | ICD-10-CM

## 2015-11-13 DIAGNOSIS — D631 Anemia in chronic kidney disease: Secondary | ICD-10-CM

## 2015-11-13 DIAGNOSIS — N08 Glomerular disorders in diseases classified elsewhere: Secondary | ICD-10-CM

## 2015-11-13 DIAGNOSIS — N2589 Other disorders resulting from impaired renal tubular function: Secondary | ICD-10-CM

## 2015-11-13 DIAGNOSIS — N183 Chronic kidney disease, stage 3 unspecified: Secondary | ICD-10-CM

## 2015-11-13 DIAGNOSIS — D5709 Hb-ss disease with crisis with other specified complication: Secondary | ICD-10-CM

## 2015-11-13 DIAGNOSIS — R0600 Dyspnea, unspecified: Secondary | ICD-10-CM

## 2015-11-13 DIAGNOSIS — D638 Anemia in other chronic diseases classified elsewhere: Secondary | ICD-10-CM | POA: Diagnosis not present

## 2015-11-13 DIAGNOSIS — N2581 Secondary hyperparathyroidism of renal origin: Secondary | ICD-10-CM

## 2015-11-13 DIAGNOSIS — D5700 Hb-SS disease with crisis, unspecified: Secondary | ICD-10-CM

## 2015-11-13 DIAGNOSIS — M109 Gout, unspecified: Secondary | ICD-10-CM

## 2015-11-13 LAB — PULMONARY FUNCTION TEST
DL/VA % pred: 33 %
DL/VA: 1.71 ml/min/mmHg/L
DLCO UNC % PRED: 50 %
DLCO UNC: 14.37 ml/min/mmHg
FEF 25-75 PRE: 1 L/s
FEF 25-75 Post: 1.74 L/sec
FEF2575-%CHANGE-POST: 74 %
FEF2575-%PRED-POST: 82 %
FEF2575-%Pred-Pre: 47 %
FEV1-%Change-Post: 13 %
FEV1-%PRED-POST: 57 %
FEV1-%Pred-Pre: 50 %
FEV1-POST: 1.28 L
FEV1-PRE: 1.13 L
FEV1FVC-%CHANGE-POST: 21 %
FEV1FVC-%PRED-PRE: 97 %
FEV6-%CHANGE-POST: -6 %
FEV6-%Pred-Post: 49 %
FEV6-%Pred-Pre: 52 %
FEV6-Post: 1.38 L
FEV6-Pre: 1.47 L
FEV6FVC-%PRED-PRE: 103 %
FEV6FVC-%Pred-Post: 103 %
FVC-%CHANGE-POST: -6 %
FVC-%Pred-Post: 48 %
FVC-%Pred-Pre: 51 %
FVC-Post: 1.39 L
FVC-Pre: 1.49 L
PRE FEV1/FVC RATIO: 76 %
PRE FEV6/FVC RATIO: 100 %
Post FEV1/FVC ratio: 92 %
Post FEV6/FVC ratio: 100 %

## 2015-11-13 LAB — SAMPLE TO BLOOD BANK

## 2015-11-13 LAB — HEMATOCRIT: HCT: 17.2 % — ABNORMAL LOW (ref 35.0–47.0)

## 2015-11-13 LAB — HEMOGLOBIN: Hemoglobin: 6.2 g/dL — ABNORMAL LOW (ref 12.0–16.0)

## 2015-11-13 MED ORDER — EPOETIN ALFA 40000 UNIT/ML IJ SOLN
40000.0000 [IU] | Freq: Once | INTRAMUSCULAR | Status: AC
  Administered 2015-11-13: 40000 [IU] via SUBCUTANEOUS
  Filled 2015-11-13: qty 1

## 2015-11-13 NOTE — Progress Notes (Signed)
SMW performed today. 

## 2015-11-13 NOTE — Progress Notes (Signed)
PFT performed today with Nitrogen washout. 

## 2015-11-15 ENCOUNTER — Inpatient Hospital Stay: Payer: BLUE CROSS/BLUE SHIELD

## 2015-11-15 VITALS — BP 145/57 | HR 70 | Resp 18

## 2015-11-15 DIAGNOSIS — M109 Gout, unspecified: Secondary | ICD-10-CM

## 2015-11-15 DIAGNOSIS — N2581 Secondary hyperparathyroidism of renal origin: Secondary | ICD-10-CM

## 2015-11-15 DIAGNOSIS — D631 Anemia in chronic kidney disease: Secondary | ICD-10-CM

## 2015-11-15 DIAGNOSIS — N189 Chronic kidney disease, unspecified: Secondary | ICD-10-CM

## 2015-11-15 DIAGNOSIS — I10 Essential (primary) hypertension: Secondary | ICD-10-CM

## 2015-11-15 DIAGNOSIS — D638 Anemia in other chronic diseases classified elsewhere: Secondary | ICD-10-CM | POA: Diagnosis not present

## 2015-11-15 DIAGNOSIS — N183 Chronic kidney disease, stage 3 unspecified: Secondary | ICD-10-CM

## 2015-11-15 DIAGNOSIS — D5700 Hb-SS disease with crisis, unspecified: Secondary | ICD-10-CM

## 2015-11-15 DIAGNOSIS — N2589 Other disorders resulting from impaired renal tubular function: Secondary | ICD-10-CM

## 2015-11-15 DIAGNOSIS — D5709 Hb-ss disease with crisis with other specified complication: Secondary | ICD-10-CM

## 2015-11-15 DIAGNOSIS — N08 Glomerular disorders in diseases classified elsewhere: Secondary | ICD-10-CM

## 2015-11-15 LAB — HEMATOCRIT: HEMATOCRIT: 17.6 % — AB (ref 35.0–47.0)

## 2015-11-15 LAB — SAMPLE TO BLOOD BANK

## 2015-11-15 LAB — HEMOGLOBIN: HEMOGLOBIN: 6.3 g/dL — AB (ref 12.0–16.0)

## 2015-11-15 MED ORDER — EPOETIN ALFA 40000 UNIT/ML IJ SOLN
40000.0000 [IU] | Freq: Once | INTRAMUSCULAR | Status: AC
  Administered 2015-11-15: 40000 [IU] via SUBCUTANEOUS
  Filled 2015-11-15: qty 1

## 2015-11-19 ENCOUNTER — Inpatient Hospital Stay: Payer: BLUE CROSS/BLUE SHIELD

## 2015-11-19 VITALS — BP 130/64 | HR 59 | Temp 98.2°F | Resp 20

## 2015-11-19 DIAGNOSIS — N189 Chronic kidney disease, unspecified: Principal | ICD-10-CM

## 2015-11-19 DIAGNOSIS — D638 Anemia in other chronic diseases classified elsewhere: Secondary | ICD-10-CM | POA: Diagnosis not present

## 2015-11-19 DIAGNOSIS — D631 Anemia in chronic kidney disease: Secondary | ICD-10-CM

## 2015-11-19 MED ORDER — SODIUM CHLORIDE 0.9 % IV SOLN
510.0000 mg | Freq: Once | INTRAVENOUS | Status: AC
  Administered 2015-11-19: 510 mg via INTRAVENOUS
  Filled 2015-11-19: qty 17

## 2015-11-19 MED ORDER — SODIUM CHLORIDE 0.9 % IV SOLN
INTRAVENOUS | Status: DC
  Administered 2015-11-19: 14:00:00 via INTRAVENOUS
  Filled 2015-11-19: qty 1000

## 2015-11-20 ENCOUNTER — Inpatient Hospital Stay: Payer: BLUE CROSS/BLUE SHIELD

## 2015-11-20 DIAGNOSIS — N189 Chronic kidney disease, unspecified: Secondary | ICD-10-CM

## 2015-11-20 DIAGNOSIS — D5700 Hb-SS disease with crisis, unspecified: Secondary | ICD-10-CM

## 2015-11-20 DIAGNOSIS — N2589 Other disorders resulting from impaired renal tubular function: Secondary | ICD-10-CM

## 2015-11-20 DIAGNOSIS — M109 Gout, unspecified: Secondary | ICD-10-CM

## 2015-11-20 DIAGNOSIS — D631 Anemia in chronic kidney disease: Secondary | ICD-10-CM

## 2015-11-20 DIAGNOSIS — N08 Glomerular disorders in diseases classified elsewhere: Secondary | ICD-10-CM

## 2015-11-20 DIAGNOSIS — D5709 Hb-ss disease with crisis with other specified complication: Secondary | ICD-10-CM

## 2015-11-20 DIAGNOSIS — I10 Essential (primary) hypertension: Secondary | ICD-10-CM

## 2015-11-20 DIAGNOSIS — N2581 Secondary hyperparathyroidism of renal origin: Secondary | ICD-10-CM

## 2015-11-20 DIAGNOSIS — N183 Chronic kidney disease, stage 3 unspecified: Secondary | ICD-10-CM

## 2015-11-20 DIAGNOSIS — D638 Anemia in other chronic diseases classified elsewhere: Secondary | ICD-10-CM | POA: Diagnosis not present

## 2015-11-20 LAB — HEMOGLOBIN: Hemoglobin: 6.1 g/dL — ABNORMAL LOW (ref 12.0–16.0)

## 2015-11-20 LAB — HEMATOCRIT: HEMATOCRIT: 17.1 % — AB (ref 35.0–47.0)

## 2015-11-20 LAB — SAMPLE TO BLOOD BANK

## 2015-11-20 MED ORDER — EPOETIN ALFA 40000 UNIT/ML IJ SOLN
40000.0000 [IU] | Freq: Once | INTRAMUSCULAR | Status: AC
  Administered 2015-11-20: 40000 [IU] via SUBCUTANEOUS
  Filled 2015-11-20: qty 1

## 2015-11-21 ENCOUNTER — Ambulatory Visit: Payer: BLUE CROSS/BLUE SHIELD

## 2015-11-22 ENCOUNTER — Telehealth: Payer: Self-pay

## 2015-11-22 ENCOUNTER — Other Ambulatory Visit: Payer: Self-pay | Admitting: Internal Medicine

## 2015-11-22 ENCOUNTER — Inpatient Hospital Stay: Payer: BLUE CROSS/BLUE SHIELD

## 2015-11-22 VITALS — BP 125/65 | HR 62 | Temp 97.1°F | Resp 18

## 2015-11-22 DIAGNOSIS — N189 Chronic kidney disease, unspecified: Principal | ICD-10-CM

## 2015-11-22 DIAGNOSIS — N2589 Other disorders resulting from impaired renal tubular function: Secondary | ICD-10-CM

## 2015-11-22 DIAGNOSIS — D5700 Hb-SS disease with crisis, unspecified: Secondary | ICD-10-CM

## 2015-11-22 DIAGNOSIS — D631 Anemia in chronic kidney disease: Secondary | ICD-10-CM

## 2015-11-22 DIAGNOSIS — M109 Gout, unspecified: Secondary | ICD-10-CM

## 2015-11-22 DIAGNOSIS — I10 Essential (primary) hypertension: Secondary | ICD-10-CM

## 2015-11-22 DIAGNOSIS — N08 Glomerular disorders in diseases classified elsewhere: Secondary | ICD-10-CM

## 2015-11-22 DIAGNOSIS — N2581 Secondary hyperparathyroidism of renal origin: Secondary | ICD-10-CM

## 2015-11-22 DIAGNOSIS — D5709 Hb-ss disease with crisis with other specified complication: Secondary | ICD-10-CM

## 2015-11-22 DIAGNOSIS — N183 Chronic kidney disease, stage 3 unspecified: Secondary | ICD-10-CM

## 2015-11-22 DIAGNOSIS — D638 Anemia in other chronic diseases classified elsewhere: Secondary | ICD-10-CM | POA: Diagnosis not present

## 2015-11-22 LAB — SAMPLE TO BLOOD BANK

## 2015-11-22 LAB — HEMOGLOBIN: HEMOGLOBIN: 5.8 g/dL — AB (ref 12.0–16.0)

## 2015-11-22 LAB — HEMATOCRIT: HCT: 16.6 % — ABNORMAL LOW (ref 35.0–47.0)

## 2015-11-22 MED ORDER — EPOETIN ALFA 40000 UNIT/ML IJ SOLN
40000.0000 [IU] | Freq: Once | INTRAMUSCULAR | Status: AC
  Administered 2015-11-22: 40000 [IU] via SUBCUTANEOUS
  Filled 2015-11-22: qty 1

## 2015-11-22 NOTE — Telephone Encounter (Signed)
Patient is here for Procrit injection and she is asking if Dr. B will prescribe her antibiotic due to scratchy throat, non productive cough, and congestion with no fever.  Her hemoglobin is also at 5.8 today and she does admit to being a little more tired than her usual.

## 2015-11-22 NOTE — Telephone Encounter (Signed)
Pt informed. Per Dr. Rogue Bussing, md does not want to prescribe abx at this time.  Proceed with procrit today per md. No blood transfusion. msg sent per md order to schedule an additional IV iron for Monday.  Pt made aware of new apts.

## 2015-11-25 ENCOUNTER — Inpatient Hospital Stay: Payer: BLUE CROSS/BLUE SHIELD

## 2015-11-25 VITALS — BP 147/57 | HR 61 | Temp 96.8°F | Resp 18

## 2015-11-25 DIAGNOSIS — D638 Anemia in other chronic diseases classified elsewhere: Secondary | ICD-10-CM | POA: Diagnosis not present

## 2015-11-25 DIAGNOSIS — D631 Anemia in chronic kidney disease: Secondary | ICD-10-CM

## 2015-11-25 DIAGNOSIS — N189 Chronic kidney disease, unspecified: Principal | ICD-10-CM

## 2015-11-25 MED ORDER — SODIUM CHLORIDE 0.9 % IV SOLN
INTRAVENOUS | Status: DC
  Administered 2015-11-25: 14:00:00 via INTRAVENOUS
  Filled 2015-11-25: qty 1000

## 2015-11-25 MED ORDER — FERUMOXYTOL INJECTION 510 MG/17 ML
510.0000 mg | Freq: Once | INTRAVENOUS | Status: AC
  Administered 2015-11-25: 510 mg via INTRAVENOUS
  Filled 2015-11-25: qty 17

## 2015-11-27 ENCOUNTER — Inpatient Hospital Stay: Payer: BLUE CROSS/BLUE SHIELD

## 2015-11-27 VITALS — BP 134/47 | HR 58 | Temp 96.1°F | Resp 18

## 2015-11-27 DIAGNOSIS — D631 Anemia in chronic kidney disease: Secondary | ICD-10-CM

## 2015-11-27 DIAGNOSIS — D5700 Hb-SS disease with crisis, unspecified: Secondary | ICD-10-CM

## 2015-11-27 DIAGNOSIS — N183 Chronic kidney disease, stage 3 unspecified: Secondary | ICD-10-CM

## 2015-11-27 DIAGNOSIS — D638 Anemia in other chronic diseases classified elsewhere: Secondary | ICD-10-CM | POA: Diagnosis not present

## 2015-11-27 DIAGNOSIS — N08 Glomerular disorders in diseases classified elsewhere: Secondary | ICD-10-CM

## 2015-11-27 DIAGNOSIS — D5709 Hb-ss disease with crisis with other specified complication: Secondary | ICD-10-CM

## 2015-11-27 DIAGNOSIS — N189 Chronic kidney disease, unspecified: Principal | ICD-10-CM

## 2015-11-27 DIAGNOSIS — N2581 Secondary hyperparathyroidism of renal origin: Secondary | ICD-10-CM

## 2015-11-27 DIAGNOSIS — I10 Essential (primary) hypertension: Secondary | ICD-10-CM

## 2015-11-27 DIAGNOSIS — M109 Gout, unspecified: Secondary | ICD-10-CM

## 2015-11-27 DIAGNOSIS — N2589 Other disorders resulting from impaired renal tubular function: Secondary | ICD-10-CM

## 2015-11-27 LAB — HEMOGLOBIN: Hemoglobin: 5.8 g/dL — ABNORMAL LOW (ref 12.0–16.0)

## 2015-11-27 LAB — HEMATOCRIT: HCT: 16.2 % — ABNORMAL LOW (ref 35.0–47.0)

## 2015-11-27 LAB — SAMPLE TO BLOOD BANK

## 2015-11-27 MED ORDER — EPOETIN ALFA 40000 UNIT/ML IJ SOLN
40000.0000 [IU] | Freq: Once | INTRAMUSCULAR | Status: AC
  Administered 2015-11-27: 40000 [IU] via SUBCUTANEOUS
  Filled 2015-11-27: qty 1

## 2015-11-29 ENCOUNTER — Inpatient Hospital Stay: Payer: BLUE CROSS/BLUE SHIELD

## 2015-11-29 ENCOUNTER — Encounter: Payer: Self-pay | Admitting: *Deleted

## 2015-11-29 ENCOUNTER — Telehealth: Payer: Self-pay

## 2015-11-29 ENCOUNTER — Other Ambulatory Visit: Payer: Self-pay | Admitting: Internal Medicine

## 2015-11-29 ENCOUNTER — Observation Stay
Admission: AD | Admit: 2015-11-29 | Discharge: 2015-11-29 | Disposition: A | Discharge status: Home / Self Care | Payer: BLUE CROSS/BLUE SHIELD | Source: Ambulatory Visit | Attending: Internal Medicine | Admitting: Internal Medicine

## 2015-11-29 ENCOUNTER — Other Ambulatory Visit: Payer: Self-pay | Admitting: *Deleted

## 2015-11-29 VITALS — BP 125/65 | HR 58 | Temp 98.5°F | Resp 18

## 2015-11-29 DIAGNOSIS — D631 Anemia in chronic kidney disease: Secondary | ICD-10-CM

## 2015-11-29 DIAGNOSIS — D151 Benign neoplasm of heart: Secondary | ICD-10-CM | POA: Insufficient documentation

## 2015-11-29 DIAGNOSIS — D5700 Hb-SS disease with crisis, unspecified: Secondary | ICD-10-CM

## 2015-11-29 DIAGNOSIS — N2581 Secondary hyperparathyroidism of renal origin: Secondary | ICD-10-CM

## 2015-11-29 DIAGNOSIS — Z833 Family history of diabetes mellitus: Secondary | ICD-10-CM | POA: Insufficient documentation

## 2015-11-29 DIAGNOSIS — Z803 Family history of malignant neoplasm of breast: Secondary | ICD-10-CM | POA: Insufficient documentation

## 2015-11-29 DIAGNOSIS — I509 Heart failure, unspecified: Secondary | ICD-10-CM | POA: Diagnosis not present

## 2015-11-29 DIAGNOSIS — N179 Acute kidney failure, unspecified: Secondary | ICD-10-CM | POA: Diagnosis not present

## 2015-11-29 DIAGNOSIS — D5709 Hb-ss disease with crisis with other specified complication: Secondary | ICD-10-CM

## 2015-11-29 DIAGNOSIS — D509 Iron deficiency anemia, unspecified: Secondary | ICD-10-CM

## 2015-11-29 DIAGNOSIS — N08 Glomerular disorders in diseases classified elsewhere: Secondary | ICD-10-CM

## 2015-11-29 DIAGNOSIS — D571 Sickle-cell disease without crisis: Principal | ICD-10-CM | POA: Insufficient documentation

## 2015-11-29 DIAGNOSIS — L439 Lichen planus, unspecified: Secondary | ICD-10-CM | POA: Insufficient documentation

## 2015-11-29 DIAGNOSIS — M109 Gout, unspecified: Secondary | ICD-10-CM | POA: Insufficient documentation

## 2015-11-29 DIAGNOSIS — N189 Chronic kidney disease, unspecified: Secondary | ICD-10-CM | POA: Insufficient documentation

## 2015-11-29 DIAGNOSIS — N183 Chronic kidney disease, stage 3 unspecified: Secondary | ICD-10-CM

## 2015-11-29 DIAGNOSIS — Z8249 Family history of ischemic heart disease and other diseases of the circulatory system: Secondary | ICD-10-CM | POA: Diagnosis not present

## 2015-11-29 DIAGNOSIS — E559 Vitamin D deficiency, unspecified: Secondary | ICD-10-CM | POA: Diagnosis not present

## 2015-11-29 DIAGNOSIS — M810 Age-related osteoporosis without current pathological fracture: Secondary | ICD-10-CM | POA: Insufficient documentation

## 2015-11-29 DIAGNOSIS — I10 Essential (primary) hypertension: Secondary | ICD-10-CM

## 2015-11-29 DIAGNOSIS — I110 Hypertensive heart disease with heart failure: Secondary | ICD-10-CM | POA: Diagnosis not present

## 2015-11-29 DIAGNOSIS — Z79899 Other long term (current) drug therapy: Secondary | ICD-10-CM | POA: Diagnosis not present

## 2015-11-29 DIAGNOSIS — Z888 Allergy status to other drugs, medicaments and biological substances status: Secondary | ICD-10-CM | POA: Insufficient documentation

## 2015-11-29 DIAGNOSIS — D696 Thrombocytopenia, unspecified: Secondary | ICD-10-CM | POA: Insufficient documentation

## 2015-11-29 DIAGNOSIS — N2589 Other disorders resulting from impaired renal tubular function: Secondary | ICD-10-CM

## 2015-11-29 DIAGNOSIS — J449 Chronic obstructive pulmonary disease, unspecified: Secondary | ICD-10-CM | POA: Insufficient documentation

## 2015-11-29 DIAGNOSIS — D6489 Other specified anemias: Secondary | ICD-10-CM

## 2015-11-29 DIAGNOSIS — Z87891 Personal history of nicotine dependence: Secondary | ICD-10-CM | POA: Diagnosis not present

## 2015-11-29 DIAGNOSIS — D638 Anemia in other chronic diseases classified elsewhere: Secondary | ICD-10-CM | POA: Diagnosis not present

## 2015-11-29 LAB — SAMPLE TO BLOOD BANK

## 2015-11-29 LAB — HEMOGLOBIN: HEMOGLOBIN: 5.8 g/dL — AB (ref 12.0–16.0)

## 2015-11-29 LAB — HEMATOCRIT: HEMATOCRIT: 16.3 % — AB (ref 35.0–47.0)

## 2015-11-29 LAB — PREPARE RBC (CROSSMATCH)

## 2015-11-29 MED ORDER — HEPARIN SOD (PORK) LOCK FLUSH 100 UNIT/ML IV SOLN: 500.0000 [IU] | Freq: Every day | INTRAVENOUS | Status: DC | PRN

## 2015-11-29 MED ORDER — HEPARIN SOD (PORK) LOCK FLUSH 100 UNIT/ML IV SOLN: 250.0000 [IU] | INTRAVENOUS | Status: DC | PRN

## 2015-11-29 MED ORDER — DIPHENHYDRAMINE HCL 25 MG PO CAPS
25.0000 mg | ORAL_CAPSULE | Freq: Once | ORAL | Status: AC
  Administered 2015-11-29: 25 mg via ORAL
  Filled 2015-11-29: qty 1

## 2015-11-29 MED ORDER — EPOETIN ALFA 40000 UNIT/ML IJ SOLN
40000.0000 [IU] | Freq: Once | INTRAMUSCULAR | Status: AC
  Administered 2015-11-29: 40000 [IU] via SUBCUTANEOUS
  Filled 2015-11-29: qty 1

## 2015-11-29 MED ORDER — SODIUM CHLORIDE 0.9% FLUSH: 10.0000 mL | INTRAVENOUS | Status: DC | PRN

## 2015-11-29 MED ORDER — SODIUM CHLORIDE 0.9% FLUSH: 3.0000 mL | INTRAVENOUS | Status: DC | PRN

## 2015-11-29 MED ORDER — FUROSEMIDE 10 MG/ML IJ SOLN
20.0000 mg | Freq: Once | INTRAMUSCULAR | Status: AC
  Administered 2015-11-29: 16:00:00 20 mg via INTRAVENOUS
  Filled 2015-11-29: qty 2

## 2015-11-29 MED ORDER — SODIUM CHLORIDE 0.9 % IV SOLN
250.0000 mL | Freq: Once | INTRAVENOUS | Status: AC
  Administered 2015-11-29: 16:00:00 250 mL via INTRAVENOUS

## 2015-11-29 MED ORDER — ACETAMINOPHEN 325 MG PO TABS
650.0000 mg | ORAL_TABLET | Freq: Once | ORAL | Status: AC
  Administered 2015-11-29: 650 mg via ORAL
  Filled 2015-11-29: qty 2

## 2015-11-29 NOTE — Plan of Care (Signed)
Direct admit from [facility]. MD order to transfuse 1 Unit PRBC's and Discharge patient. Husband at Bedside. Left Hand IV Placed. PRBC's initiated and transfused. No s/s of reactions noted.  IV removed.  No unanswered questions. Discharged via wheelchair by RN.  Belongings sent with patient and husband.

## 2015-11-29 NOTE — Telephone Encounter (Signed)
Blood ordered-pt will be transfused today. 1 unit

## 2015-11-29 NOTE — Progress Notes (Signed)
Blood bank orders released. Blood bank notified.  I contacted [facility]-due to acuity, will need to send pt to unit for phase 3 admission for blood transfusion. Pt made aware.

## 2015-11-29 NOTE — Care Management (Signed)
Patient here for blood transfusion only. Plan for discharge after transfusion. No MOON letter delivered.

## 2015-11-29 NOTE — Progress Notes (Signed)
Call report performed by Theadora Rama, RN to [place], RN. Pt will be transported to room 106 via w/c. Patient in no acute distress. Presented to clinic today with shortness of breath upon exertion. C/o lower extremity weakness/fatigue. C/o headache. Patient will be transported by Pauline Good, RN

## 2015-11-29 NOTE — Telephone Encounter (Signed)
erroneous note encounter 

## 2015-11-29 NOTE — Telephone Encounter (Signed)
Patient is here for Procrit injections and she does mention some new symptoms since last injection.  The symptoms she presents today is increase in congestion with productive cough, increase in SOBr, leg weakness, and a constant headache.

## 2015-11-30 LAB — TYPE AND SCREEN
ABO/RH(D): O POS
Antibody Screen: NEGATIVE
UNIT DIVISION: 0

## 2015-12-04 ENCOUNTER — Inpatient Hospital Stay: Payer: BLUE CROSS/BLUE SHIELD | Attending: Internal Medicine

## 2015-12-04 ENCOUNTER — Inpatient Hospital Stay: Payer: BLUE CROSS/BLUE SHIELD

## 2015-12-04 ENCOUNTER — Other Ambulatory Visit: Payer: Self-pay

## 2015-12-04 VITALS — BP 117/62 | HR 61 | Resp 20

## 2015-12-04 DIAGNOSIS — N2581 Secondary hyperparathyroidism of renal origin: Secondary | ICD-10-CM

## 2015-12-04 DIAGNOSIS — Z803 Family history of malignant neoplasm of breast: Secondary | ICD-10-CM | POA: Insufficient documentation

## 2015-12-04 DIAGNOSIS — D151 Benign neoplasm of heart: Secondary | ICD-10-CM | POA: Diagnosis not present

## 2015-12-04 DIAGNOSIS — D571 Sickle-cell disease without crisis: Secondary | ICD-10-CM | POA: Insufficient documentation

## 2015-12-04 DIAGNOSIS — Z79899 Other long term (current) drug therapy: Secondary | ICD-10-CM | POA: Insufficient documentation

## 2015-12-04 DIAGNOSIS — I509 Heart failure, unspecified: Secondary | ICD-10-CM | POA: Insufficient documentation

## 2015-12-04 DIAGNOSIS — N2589 Other disorders resulting from impaired renal tubular function: Secondary | ICD-10-CM

## 2015-12-04 DIAGNOSIS — L439 Lichen planus, unspecified: Secondary | ICD-10-CM | POA: Diagnosis not present

## 2015-12-04 DIAGNOSIS — I4891 Unspecified atrial fibrillation: Secondary | ICD-10-CM | POA: Insufficient documentation

## 2015-12-04 DIAGNOSIS — D631 Anemia in chronic kidney disease: Secondary | ICD-10-CM

## 2015-12-04 DIAGNOSIS — I129 Hypertensive chronic kidney disease with stage 1 through stage 4 chronic kidney disease, or unspecified chronic kidney disease: Secondary | ICD-10-CM | POA: Diagnosis present

## 2015-12-04 DIAGNOSIS — M818 Other osteoporosis without current pathological fracture: Secondary | ICD-10-CM | POA: Diagnosis not present

## 2015-12-04 DIAGNOSIS — E559 Vitamin D deficiency, unspecified: Secondary | ICD-10-CM | POA: Diagnosis not present

## 2015-12-04 DIAGNOSIS — D5700 Hb-SS disease with crisis, unspecified: Secondary | ICD-10-CM

## 2015-12-04 DIAGNOSIS — M109 Gout, unspecified: Secondary | ICD-10-CM | POA: Insufficient documentation

## 2015-12-04 DIAGNOSIS — N189 Chronic kidney disease, unspecified: Principal | ICD-10-CM

## 2015-12-04 DIAGNOSIS — D5709 Hb-ss disease with crisis with other specified complication: Secondary | ICD-10-CM

## 2015-12-04 DIAGNOSIS — J449 Chronic obstructive pulmonary disease, unspecified: Secondary | ICD-10-CM | POA: Insufficient documentation

## 2015-12-04 DIAGNOSIS — G4730 Sleep apnea, unspecified: Secondary | ICD-10-CM | POA: Diagnosis not present

## 2015-12-04 DIAGNOSIS — N183 Chronic kidney disease, stage 3 unspecified: Secondary | ICD-10-CM

## 2015-12-04 DIAGNOSIS — D696 Thrombocytopenia, unspecified: Secondary | ICD-10-CM | POA: Insufficient documentation

## 2015-12-04 DIAGNOSIS — Z87891 Personal history of nicotine dependence: Secondary | ICD-10-CM | POA: Diagnosis not present

## 2015-12-04 DIAGNOSIS — N08 Glomerular disorders in diseases classified elsewhere: Secondary | ICD-10-CM

## 2015-12-04 DIAGNOSIS — N184 Chronic kidney disease, stage 4 (severe): Secondary | ICD-10-CM | POA: Diagnosis not present

## 2015-12-04 DIAGNOSIS — I10 Essential (primary) hypertension: Secondary | ICD-10-CM

## 2015-12-04 LAB — HEMATOCRIT: HEMATOCRIT: 18.2 % — AB (ref 35.0–47.0)

## 2015-12-04 LAB — SAMPLE TO BLOOD BANK

## 2015-12-04 LAB — HEMOGLOBIN: HEMOGLOBIN: 6.7 g/dL — AB (ref 12.0–16.0)

## 2015-12-04 MED ORDER — EPOETIN ALFA 40000 UNIT/ML IJ SOLN
40000.0000 [IU] | Freq: Once | INTRAMUSCULAR | Status: AC
  Administered 2015-12-04: 40000 [IU] via SUBCUTANEOUS
  Filled 2015-12-04: qty 1

## 2015-12-05 ENCOUNTER — Ambulatory Visit (INDEPENDENT_AMBULATORY_CARE_PROVIDER_SITE_OTHER): Payer: BLUE CROSS/BLUE SHIELD | Admitting: Internal Medicine

## 2015-12-05 ENCOUNTER — Other Ambulatory Visit: Payer: Self-pay | Admitting: Internal Medicine

## 2015-12-05 ENCOUNTER — Encounter: Payer: Self-pay | Admitting: Internal Medicine

## 2015-12-05 VITALS — BP 134/64 | HR 61 | Ht 67.0 in | Wt 190.0 lb

## 2015-12-05 DIAGNOSIS — J449 Chronic obstructive pulmonary disease, unspecified: Secondary | ICD-10-CM

## 2015-12-05 NOTE — Progress Notes (Signed)
Date: 12/05/2015,   MRN# [id number] Jane Short 28-Nov-1950 Code Status:  Hosp day:@LENGTHOFSTAYDAYS @ Referring MD: @ATDPROV @     PCP:      AdmissionWeight: 190 lb (86.183 kg)                 CurrentWeight: 190 lb (86.183 kg) Jane Short is a 65 y.o. old female seen in consultation for chronic SOB and cough.     CHIEF COMPLAINT:   Follow up Chronic cough and SOB   HISTORY OF PRESENT ILLNESS  H/o  Vtach/heart failure and Endocarditis   Patient states that dulera has helped alot Patient has chronic SOB, no fevers, chills, no signs of infection at this time No signs of infection at this time Patient has daytime sleepiness, early morning fatigue and tiredness-sleep study pos for OSA   PFT 11/13/2015 Ration WNL, FVC 2.8L 50% Fef25/75 2.1L 47% DLCO 50% Pos BD response Assessment-mild small airways obstructive disease, moderate restrictive lung disease  ONO results Positive for hypoxia and 6MWT positive for hypoxia Results discussed with patient  I have discussed that patient be tested for sleep titration as she refused All tests reviewed with patient    Current Medication:  Current outpatient prescriptions:  .  albuterol (PROVENTIL HFA;VENTOLIN HFA) 108 (90 Base) MCG/ACT inhaler, Inhale 2 puffs into the lungs every 4 (four) hours as needed for wheezing or shortness of breath., Disp: 1 Inhaler, Rfl: 2 .  calcitRIOL (ROCALTROL) 0.25 MCG capsule, Take 0.25 mcg by mouth daily., Disp: , Rfl:  .  folic acid (FOLVITE) 1 MG tablet, Take 1 mg by mouth daily., Disp: , Rfl:  .  furosemide (LASIX) 80 MG tablet, Take 1 tablet (80 mg total) by mouth 2 (two) times daily., Disp: 30 tablet, Rfl: 0 .  hydroxyurea (HYDREA) 500 MG capsule, Take 1 capsule (500 mg total) by mouth daily. May take with food to minimize GI side effects., Disp: 60 capsule, Rfl: 3 .  mometasone-formoterol (DULERA) 200-5 MCG/ACT AERO, Inhale 2 puffs into the lungs 2 (two) times daily., Disp: 1 Inhaler,  Rfl: 12 .  sodium bicarbonate 650 MG tablet, Take by mouth., Disp: , Rfl:  .  zaleplon (SONATA) 5 MG capsule, Reported on 11/06/2015, Disp: , Rfl: 3 .  amiodarone (PACERONE) 200 MG tablet, Take 200 mg by mouth daily., Disp: , Rfl:  No current facility-administered medications for this visit.  Facility-Administered Medications Ordered in Other Visits:  .  heparin lock flush 100 unit/mL, 250 Units, Intracatheter, PRN, Leia Alf, MD    ALLERGIES   Ramipril     REVIEW OF SYSTEMS   Review of Systems  Constitutional: Negative for fever, chills, malaise/fatigue and diaphoresis.  Respiratory: Positive for shortness of breath. Negative for cough, hemoptysis, sputum production and wheezing.   Cardiovascular: Positive for leg swelling. Negative for chest pain and orthopnea.  Gastrointestinal: Negative for heartburn, nausea and abdominal pain.     VS: BP 134/64 mmHg  Pulse 61  Ht 5\' 7"  (1.702 m)  Wt 190 lb (86.183 kg)  BMI 29.75 kg/m2  SpO2 90%     PHYSICAL EXAM  Physical Exam  Constitutional: She is oriented to person, place, and time. No distress.  Cardiovascular: Normal rate, regular rhythm and normal heart sounds.   No murmur heard. Pulmonary/Chest: Effort normal and breath sounds normal. No respiratory distress. She has no wheezes. She has no rales.  Abdominal: Soft. Bowel sounds are normal.  Musculoskeletal: Normal range of motion. She exhibits edema.  Neurological: She is  alert and oriented to person, place, and time.  Skin: She is not diaphoretic.  Psychiatric: She has a normal mood and affect.       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 3.patient will need oxygen 2 l [place] with  exertion and at night 4.follow up hem onc as needed  Follow up in 3 months   The Patient requires high complexity decision making for assessment and support, frequent evaluation and titration of therapies, application of advanced monitoring technologies and extensive interpretation of multiple databases.  Patient is satisfied with Plan of action and management.   Jane Short, M.D.  [facility]  Medical Director [place] Director [facility] [address] Cardio-Pulmonary Department

## 2015-12-05 NOTE — Patient Instructions (Signed)
1.re-order sleep titration 2.please order oxygen 2 L [place] with exertion and at night   Sleep Apnea  Sleep apnea is a sleep disorder characterized by abnormal pauses in breathing while you sleep. When your breathing pauses, the level of oxygen in your blood decreases. This causes you to move out of deep sleep and into light sleep. As a result, your quality of sleep is poor, and the system that carries your blood throughout your body (cardiovascular system) experiences stress. If sleep apnea remains untreated, the following conditions can develop:  High blood pressure (hypertension).  Coronary artery disease.  Inability to achieve or maintain an erection (impotence).  Impairment of your thought process (cognitive dysfunction). There are three types of sleep apnea: 1. Obstructive sleep apnea--Pauses in breathing during sleep because of a blocked airway. 2. Central sleep apnea--Pauses in breathing during sleep because the area of the brain that controls your breathing does not send the correct signals to the muscles that control breathing. 3. Mixed sleep apnea--A combination of both obstructive and central sleep apnea. RISK FACTORS The following risk factors can increase your risk of developing sleep apnea:  Being overweight.  Smoking.  Having narrow passages in your nose and throat.  Being of older age.  Being female.  Alcohol use.  Sedative and tranquilizer use.  Ethnicity. Among individuals younger than 35 years, African Americans are at increased risk of sleep apnea. SYMPTOMS   Difficulty staying asleep.  Daytime sleepiness and fatigue.  Loss of energy.  Irritability.  Loud, heavy snoring.  Morning headaches.  Trouble concentrating.  Forgetfulness.  Decreased interest in sex.  Unexplained sleepiness. DIAGNOSIS  In order to diagnose sleep apnea, your caregiver will perform a physical examination. A sleep study done in the comfort of your own home may be  appropriate if you are otherwise healthy. Your caregiver may also recommend that you spend the night in a sleep lab. In the sleep lab, several monitors record information about your heart, lungs, and brain while you sleep. Your leg and arm movements and blood oxygen level are also recorded. TREATMENT The following actions may help to resolve mild sleep apnea:  Sleeping on your side.   Using a decongestant if you have nasal congestion.   Avoiding the use of depressants, including alcohol, sedatives, and narcotics.   Losing weight and modifying your diet if you are overweight. There also are devices and treatments to help open your airway:  Oral appliances. These are custom-made mouthpieces that shift your lower jaw forward and slightly open your bite. This opens your airway.  Devices that create positive airway pressure. This positive pressure "splints" your airway open to help you breathe better during sleep. The following devices create positive airway pressure:  Continuous positive airway pressure (CPAP) device. The CPAP device creates a continuous level of air pressure with an air pump. The air is delivered to your airway through a mask while you sleep. This continuous pressure keeps your airway open.  Nasal expiratory positive airway pressure (EPAP) device. The EPAP device creates positive air pressure as you exhale. The device consists of single-use valves, which are inserted into each nostril and held in place by adhesive. The valves create very little resistance when you inhale but create much more resistance when you exhale. That increased resistance creates the positive airway pressure. This positive pressure while you exhale keeps your airway open, making it easier to breath when you inhale again.  Bilevel positive airway pressure (BPAP) device. The BPAP device is used  mainly in patients with central sleep apnea. This device is similar to the CPAP device because it also uses an air  pump to deliver continuous air pressure through a mask. However, with the BPAP machine, the pressure is set at two different levels. The pressure when you exhale is lower than the pressure when you inhale.  Surgery. Typically, surgery is only done if you cannot comply with less invasive treatments or if the less invasive treatments do not improve your condition. Surgery involves removing excess tissue in your airway to create a wider passage way.   This information is not intended to replace advice given to you by your health care provider. Make sure you discuss any questions you have with your health care provider.   Document Released: 05/08/2002 Document Revised: 06/08/2014 Document Reviewed: 09/24/2011 Elsevier Interactive Patient Education [employer].

## 2015-12-06 ENCOUNTER — Inpatient Hospital Stay: Payer: BLUE CROSS/BLUE SHIELD

## 2015-12-06 ENCOUNTER — Inpatient Hospital Stay (HOSPITAL_BASED_OUTPATIENT_CLINIC_OR_DEPARTMENT_OTHER): Payer: BLUE CROSS/BLUE SHIELD | Admitting: Internal Medicine

## 2015-12-06 VITALS — BP 115/60 | HR 61 | Temp 97.9°F | Resp 18 | Wt 191.1 lb

## 2015-12-06 DIAGNOSIS — D509 Iron deficiency anemia, unspecified: Secondary | ICD-10-CM | POA: Insufficient documentation

## 2015-12-06 DIAGNOSIS — D571 Sickle-cell disease without crisis: Secondary | ICD-10-CM

## 2015-12-06 DIAGNOSIS — D631 Anemia in chronic kidney disease: Secondary | ICD-10-CM

## 2015-12-06 DIAGNOSIS — N183 Chronic kidney disease, stage 3 unspecified: Secondary | ICD-10-CM

## 2015-12-06 DIAGNOSIS — I10 Essential (primary) hypertension: Secondary | ICD-10-CM

## 2015-12-06 DIAGNOSIS — I129 Hypertensive chronic kidney disease with stage 1 through stage 4 chronic kidney disease, or unspecified chronic kidney disease: Secondary | ICD-10-CM | POA: Diagnosis not present

## 2015-12-06 DIAGNOSIS — I509 Heart failure, unspecified: Secondary | ICD-10-CM

## 2015-12-06 DIAGNOSIS — N189 Chronic kidney disease, unspecified: Secondary | ICD-10-CM

## 2015-12-06 DIAGNOSIS — D151 Benign neoplasm of heart: Secondary | ICD-10-CM

## 2015-12-06 DIAGNOSIS — N184 Chronic kidney disease, stage 4 (severe): Secondary | ICD-10-CM

## 2015-12-06 DIAGNOSIS — D696 Thrombocytopenia, unspecified: Secondary | ICD-10-CM

## 2015-12-06 DIAGNOSIS — N2581 Secondary hyperparathyroidism of renal origin: Secondary | ICD-10-CM

## 2015-12-06 DIAGNOSIS — M109 Gout, unspecified: Secondary | ICD-10-CM

## 2015-12-06 DIAGNOSIS — M818 Other osteoporosis without current pathological fracture: Secondary | ICD-10-CM

## 2015-12-06 DIAGNOSIS — L439 Lichen planus, unspecified: Secondary | ICD-10-CM

## 2015-12-06 DIAGNOSIS — I4891 Unspecified atrial fibrillation: Secondary | ICD-10-CM

## 2015-12-06 DIAGNOSIS — J449 Chronic obstructive pulmonary disease, unspecified: Secondary | ICD-10-CM

## 2015-12-06 DIAGNOSIS — N2589 Other disorders resulting from impaired renal tubular function: Secondary | ICD-10-CM

## 2015-12-06 DIAGNOSIS — E559 Vitamin D deficiency, unspecified: Secondary | ICD-10-CM

## 2015-12-06 DIAGNOSIS — Z803 Family history of malignant neoplasm of breast: Secondary | ICD-10-CM

## 2015-12-06 DIAGNOSIS — Z79899 Other long term (current) drug therapy: Secondary | ICD-10-CM

## 2015-12-06 DIAGNOSIS — N08 Glomerular disorders in diseases classified elsewhere: Secondary | ICD-10-CM

## 2015-12-06 DIAGNOSIS — D5709 Hb-ss disease with crisis with other specified complication: Secondary | ICD-10-CM

## 2015-12-06 DIAGNOSIS — Z87891 Personal history of nicotine dependence: Secondary | ICD-10-CM

## 2015-12-06 DIAGNOSIS — D5700 Hb-SS disease with crisis, unspecified: Secondary | ICD-10-CM

## 2015-12-06 DIAGNOSIS — G4730 Sleep apnea, unspecified: Secondary | ICD-10-CM

## 2015-12-06 LAB — COMPREHENSIVE METABOLIC PANEL
ALK PHOS: 177 U/L — AB (ref 38–126)
ALT: 23 U/L (ref 14–54)
ANION GAP: 8 (ref 5–15)
AST: 41 U/L (ref 15–41)
Albumin: 3.4 g/dL — ABNORMAL LOW (ref 3.5–5.0)
BILIRUBIN TOTAL: 4.7 mg/dL — AB (ref 0.3–1.2)
BUN: 65 mg/dL — AB (ref 6–20)
CALCIUM: 8.5 mg/dL — AB (ref 8.9–10.3)
CO2: 22 mmol/L (ref 22–32)
Chloride: 104 mmol/L (ref 101–111)
Creatinine, Ser: 3.57 mg/dL — ABNORMAL HIGH (ref 0.44–1.00)
GFR calc Af Amer: 14 mL/min — ABNORMAL LOW (ref >60)
GFR, EST NON AFRICAN AMERICAN: 13 mL/min — AB (ref >60)
GLUCOSE: 107 mg/dL — AB (ref 65–99)
POTASSIUM: 5.2 mmol/L — AB (ref 3.5–5.1)
Sodium: 134 mmol/L — ABNORMAL LOW (ref 135–145)
TOTAL PROTEIN: 6.5 g/dL (ref 6.5–8.1)

## 2015-12-06 LAB — CBC WITH DIFFERENTIAL/PLATELET
BASOS PCT: 0 %
Band Neutrophils: 1 %
Basophils Absolute: 0 10*3/uL (ref 0–0.1)
EOS PCT: 1 %
Eosinophils Absolute: 0.1 10*3/uL (ref 0–0.7)
HEMATOCRIT: 19.7 % — AB (ref 35.0–47.0)
HEMOGLOBIN: 6.6 g/dL — AB (ref 12.0–16.0)
Lymphocytes Relative: 19 %
Lymphs Abs: 1.4 10*3/uL (ref 1.0–3.6)
MCHC: 33.5 g/dL (ref 32.0–36.0)
MONO ABS: 1.7 10*3/uL — AB (ref 0.2–0.9)
MONOS PCT: 22 %
Neutro Abs: 4.4 10*3/uL (ref 1.4–6.5)
Neutrophils Relative %: 57 %
Platelets: 194 10*3/uL (ref 150–440)
RDW: 29.4 % — AB (ref 11.5–14.5)
WBC: 7.6 10*3/uL (ref 3.6–11.0)
nRBC: 36 /100 WBC — ABNORMAL HIGH

## 2015-12-06 LAB — SAMPLE TO BLOOD BANK

## 2015-12-06 MED ORDER — EPOETIN ALFA 40000 UNIT/ML IJ SOLN
40000.0000 [IU] | Freq: Once | INTRAMUSCULAR | Status: AC
  Administered 2015-12-06: 40000 [IU] via SUBCUTANEOUS
  Filled 2015-12-06: qty 1

## 2015-12-06 NOTE — Assessment & Plan Note (Addendum)
#   SEVERE ANEMIA-Multifactorial chronic kidney disease/iron deficiency/sickle cell disease. Brisk bone marrow response  # Severe anemia hemoglobin today ~6/ improved from Hb- 3 from last few weeks. Continue Procrit 40,000 units Wednesday Friday. Plan Starting IV Ferrahem every 2 weeks- patient has good response to IV iron. Ferritin not a good indicator.  # Sickle cell disease- confirmed on hemoglobin electrophoresis. On folic acid once a day. On  Hydrea 500 mg once a day.  #  Chronic kidney disease/ acute renal failure- today creatinine is 3.5  # COPD/OSA/CHF [Dr.Kasa] on Home O2 -2L [place]; CPAP.   #   Patient will follow-up with me in approximately  6 weeks;  Continue Procrit  twice weekly also IV iron every 2 weeks.

## 2015-12-06 NOTE — Progress Notes (Signed)
Patient states she was placed on oxygen yesterday, 24 hrs a day.  Did not wear it to clinic this morning. O2 saturation 86 @ rest.

## 2015-12-06 NOTE — Progress Notes (Signed)
[facility] OFFICE PROGRESS NOTE  Patient Care Team: Jackolyn Confer, MD as PCP - General (Internal Medicine) Seeplaputhur Robinette Haines, MD (General Surgery) Rubbie Battiest, NP as Nurse Practitioner (Gerontology)   SUMMARY OF ONCOLOGIC HISTORY:  # Severe Anemia- multifactorial [as per Pt baseline 6-6.5] SCD/CKD- on procrit 40K/IV Iron; May 2017- Procrit 40K W-F; July 2017-Start IV Ferrahem q2W   # Short CELL ANEMIA/ Congestive hepatopathy [Dec 2016- Bili 46s s/p liver bx; [facility]]; May 2017- RestartHydrea 500 qD  # Acute renal failure/ CKD [Dr.Kolluru]/ CHF [Dr.]  # Jan 2017 ? Endocarditis s/p IV Abx/ A. Fib [not on anti-coag]   INTERVAL HISTORY:  65 year old female patient with above history of Short cell anemia chronic kidney disease/CHF- is here for follow-up.   Patient recently received 2 units of blood when the hemoglobin dropped to 5.5/patient was tired. However her hemoglobin has been more steady since increasing the Procrit to 40,000 units twice weekly  Patient states no worsening fatigue. She has been started on oxygen by pulmonary.Otherwise no fever no chills.  No nausea no vomiting.  REVIEW OF SYSTEMS:  A complete 10 point review of system is done which is negative except mentioned above/history of present illness.   PAST MEDICAL HISTORY :  Past Medical History  Diagnosis Date  . 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 Vitamin D deficiency   . Hypertension   . Osteoporosis 2011    osteopenia  . 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 Cheree Ditto at [facility]  . Gout   . Short cell anemia (HCC)   . Lichen planus   . 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 COPD (chronic obstructive pulmonary disease) (HCC)     symptoms Dr. Patricia Pesa   . CHF (congestive heart failure) ([place])   . Congestive heart failure (CHF) ([place])   . Congestive heart failure (CHF) ([place]) 06/2015  . Anemia   . Thrombocytopenia ([place])     PAST SURGICAL HISTORY :    Past Surgical History  Procedure Laterality Date  . Tubal ligation    . Gallbladder surgery    . Colonoscopy  2014    [facility]  . Peripheral vascular catheterization Left 06/14/2015    Procedure: Dialysis/Perma Catheter Insertion;  Surgeon: Katha Cabal, MD;  Location: [place] [facility];  Service: Cardiovascular;  Laterality: Left;    FAMILY HISTORY :   Family History  Problem Relation Age of Onset  . Heart disease Father   . Diabetes Father   . Diabetes Sister   . Cancer Mother     breast cancer  . Cancer Maternal Aunt     Breast cancer    SOCIAL HISTORY:   Social History  Substance Use Topics  . Smoking status: Former Smoker -- 0.25 packs/day for 30 years    Types: Cigarettes    Quit date: 04/29/2015  . Smokeless tobacco: Never Used     Comment: quit 1 month ago  . Alcohol Use: No    ALLERGIES:  is allergic to ramipril.  MEDICATIONS:  Current Outpatient Prescriptions  Medication Sig Dispense Refill  . albuterol (PROVENTIL HFA;VENTOLIN HFA) 108 (90 Base) MCG/ACT inhaler Inhale 2 puffs into the lungs every 4 (four) hours as needed for wheezing or shortness of breath. 1 Inhaler 2  . calcitRIOL (ROCALTROL) 0.25 MCG capsule Take 0.25 mcg by mouth daily.    . folic acid (FOLVITE) 1 MG tablet Take  1 mg by mouth daily.    . furosemide (LASIX) 80 MG tablet Take 1 tablet (80 mg total) by mouth 2 (two) times daily. 30 tablet 0  . hydroxyurea (HYDREA) 500 MG capsule Take 1 capsule (500 mg total) by mouth daily. May take with food to minimize GI side effects. 60 capsule 3  . mometasone-formoterol (DULERA) 200-5 MCG/ACT AERO Inhale 2 puffs into the lungs 2 (two) times daily. 1 Inhaler 12  . OXYGEN Inhale into the lungs.    . sodium bicarbonate 650 MG tablet Take by mouth.    . zaleplon (SONATA) 5 MG capsule Reported on 11/06/2015  3  . amiodarone (PACERONE) 200 MG tablet Take 200 mg by mouth daily.     No current facility-administered medications for this visit.    Facility-Administered Medications Ordered in Other Visits  Medication Dose Route Frequency Provider Last Rate Last Dose  . heparin lock flush 100 unit/mL  250 Units Intracatheter PRN Leia Alf, MD        PHYSICAL EXAMINATION:   BP 115/60 mmHg  Pulse 61  Temp(Src) 97.9 F (36.6 C) (Tympanic)  Resp 18  Wt 191 lb 2.2 oz (86.7 kg)  Filed Weights   12/06/15 0850  Weight: 191 lb 2.2 oz (86.7 kg)    GENERAL: Well-nourished well-developed; Alert, no distress and comfortable.  With her husband.She is walking by herself. Accompanied by her husband. She is on nasal Oxygen. EYES: positive for pallor/no icterus.  OROPHARYNX: no thrush or ulceration; good dentition  NECK: supple, no masses felt LYMPH:  no palpable lymphadenopathy in the cervical, axillary or inguinal regions LUNGS: decreased breath sounds to auscultation and  No wheeze or crackles HEART/CVS: regular rate & rhythm and no murmurs; No positive lower extremity edema ABDOMEN:abdomen soft, non-tender and normal bowel sounds Musculoskeletal:no cyanosis of digits and no clubbing; PSYCH: alert & oriented x 3 with fluent speech NEURO: no focal motor/sensory deficits SKIN: lichen planus lesions on the Legs bil [>15 years per pt]  LABORATORY DATA:  I have reviewed the data as listed    Component Value Date/Time   NA 134* 12/06/2015 0823   NA 138 06/20/2012 1400   NA 141 02/20/2010   K 5.2* 12/06/2015 0823   K 4.3 06/20/2012 1400   CL 104 12/06/2015 0823   CL 105 06/20/2012 1400   CO2 22 12/06/2015 0823   CO2 24 06/20/2012 1400   GLUCOSE 107* 12/06/2015 0823   GLUCOSE 102* 06/20/2012 1400   BUN 65* 12/06/2015 0823   BUN 13 06/20/2012 1400   BUN 14 02/20/2010   CREATININE 3.57* 12/06/2015 0823   CREATININE 1.36* 03/22/2013 1522   CREATININE 1.1 02/20/2010   CALCIUM 8.5* 12/06/2015 0823   CALCIUM 9.6 11/08/2015 1445   CALCIUM 9.4 06/20/2012 1400   PROT 6.5 12/06/2015 0823   PROT 7.1 03/22/2013 1522   ALBUMIN  3.4* 12/06/2015 0823   ALBUMIN 3.7 03/22/2013 1522   AST 41 12/06/2015 0823   AST 48* 03/22/2013 1522   ALT 23 12/06/2015 0823   ALT 25 03/22/2013 1522   ALKPHOS 177* 12/06/2015 0823   ALKPHOS 139* 03/22/2013 1522   BILITOT 4.7* 12/06/2015 0823   BILITOT 4.0* 03/22/2013 1522   GFRNONAA 13* 12/06/2015 0823   GFRNONAA 42* 03/22/2013 1522   GFRAA 14* 12/06/2015 0823   GFRAA 48* 03/22/2013 1522    No results found for: SPEP, UPEP  Lab Results  Component Value Date   WBC 7.6 12/06/2015   NEUTROABS 4.4 12/06/2015  HGB 6.6* 12/06/2015   HCT 19.7* 12/06/2015   MCV SEE COMMENT BELOW 12/06/2015   PLT 194 12/06/2015      Chemistry      Component Value Date/Time   NA 134* 12/06/2015 0823   NA 138 06/20/2012 1400   NA 141 02/20/2010   K 5.2* 12/06/2015 0823   K 4.3 06/20/2012 1400   CL 104 12/06/2015 0823   CL 105 06/20/2012 1400   CO2 22 12/06/2015 0823   CO2 24 06/20/2012 1400   BUN 65* 12/06/2015 0823   BUN 13 06/20/2012 1400   BUN 14 02/20/2010   CREATININE 3.57* 12/06/2015 0823   CREATININE 1.36* 03/22/2013 1522   CREATININE 1.1 02/20/2010   GLU 89 02/20/2010      Component Value Date/Time   CALCIUM 8.5* 12/06/2015 0823   CALCIUM 9.6 11/08/2015 1445   CALCIUM 9.4 06/20/2012 1400   ALKPHOS 177* 12/06/2015 0823   ALKPHOS 139* 03/22/2013 1522   AST 41 12/06/2015 0823   AST 48* 03/22/2013 1522   ALT 23 12/06/2015 0823   ALT 25 03/22/2013 1522   BILITOT 4.7* 12/06/2015 0823   BILITOT 4.0* 03/22/2013 1522         ASSESSMENT & PLAN:   Anemia of chronic renal failure, stage 4 (severe) (HCC) # SEVERE ANEMIA-Multifactorial chronic kidney disease/iron deficiency/Short cell disease. Brisk bone marrow response  # Severe anemia hemoglobin today ~6/ improved from Hb- 3 from last few weeks. Continue Procrit 40,000 units Wednesday Friday. Plan Starting IV Ferrahem every 2 weeks- patient has good response to IV iron. Ferritin not a good indicator.  # Short cell  disease- confirmed on hemoglobin electrophoresis. On folic acid once a day. On  Hydrea 500 mg once a day.  #  Chronic kidney disease/ acute renal failure- today creatinine is 3.5  # COPD/OSA/CHF [Dr.Kasa] on Home O2 -2L [place]; CPAP.   #   Patient will follow-up with me in approximately  6 weeks;  Continue Procrit  twice weekly also IV iron every 2 weeks.      Jane Sickle, MD 12/06/2015 6:05 PM

## 2015-12-09 ENCOUNTER — Inpatient Hospital Stay: Payer: BLUE CROSS/BLUE SHIELD

## 2015-12-09 VITALS — BP 119/63 | HR 60 | Resp 18

## 2015-12-09 DIAGNOSIS — D631 Anemia in chronic kidney disease: Secondary | ICD-10-CM

## 2015-12-09 DIAGNOSIS — D509 Iron deficiency anemia, unspecified: Secondary | ICD-10-CM

## 2015-12-09 DIAGNOSIS — D571 Sickle-cell disease without crisis: Secondary | ICD-10-CM

## 2015-12-09 DIAGNOSIS — N184 Chronic kidney disease, stage 4 (severe): Secondary | ICD-10-CM

## 2015-12-09 DIAGNOSIS — I129 Hypertensive chronic kidney disease with stage 1 through stage 4 chronic kidney disease, or unspecified chronic kidney disease: Secondary | ICD-10-CM | POA: Diagnosis not present

## 2015-12-09 DIAGNOSIS — N189 Chronic kidney disease, unspecified: Principal | ICD-10-CM

## 2015-12-09 LAB — SAMPLE TO BLOOD BANK

## 2015-12-09 LAB — HEMATOCRIT: HCT: 17.7 % — ABNORMAL LOW (ref 35.0–47.0)

## 2015-12-09 LAB — HEMOGLOBIN: Hemoglobin: 6.4 g/dL — ABNORMAL LOW (ref 12.0–16.0)

## 2015-12-09 MED ORDER — EPOETIN ALFA 40000 UNIT/ML IJ SOLN
40000.0000 [IU] | Freq: Once | INTRAMUSCULAR | Status: AC
  Administered 2015-12-09: 40000 [IU] via SUBCUTANEOUS
  Filled 2015-12-09: qty 1

## 2015-12-12 ENCOUNTER — Inpatient Hospital Stay: Payer: BLUE CROSS/BLUE SHIELD

## 2015-12-12 VITALS — BP 149/63 | HR 55 | Temp 98.8°F | Resp 18

## 2015-12-12 DIAGNOSIS — D631 Anemia in chronic kidney disease: Secondary | ICD-10-CM

## 2015-12-12 DIAGNOSIS — I129 Hypertensive chronic kidney disease with stage 1 through stage 4 chronic kidney disease, or unspecified chronic kidney disease: Secondary | ICD-10-CM | POA: Diagnosis not present

## 2015-12-12 DIAGNOSIS — N189 Chronic kidney disease, unspecified: Principal | ICD-10-CM

## 2015-12-12 MED ORDER — SODIUM CHLORIDE 0.9 % IV SOLN
Freq: Once | INTRAVENOUS | Status: AC
  Administered 2015-12-12: 14:00:00 via INTRAVENOUS
  Filled 2015-12-12: qty 1000

## 2015-12-12 MED ORDER — SODIUM CHLORIDE 0.9 % IV SOLN
510.0000 mg | Freq: Once | INTRAVENOUS | Status: AC
  Administered 2015-12-12: 510 mg via INTRAVENOUS
  Filled 2015-12-12: qty 17

## 2015-12-13 ENCOUNTER — Ambulatory Visit: Payer: BLUE CROSS/BLUE SHIELD

## 2015-12-13 ENCOUNTER — Inpatient Hospital Stay: Payer: BLUE CROSS/BLUE SHIELD

## 2015-12-13 VITALS — BP 131/60 | HR 61 | Resp 18

## 2015-12-13 DIAGNOSIS — D631 Anemia in chronic kidney disease: Secondary | ICD-10-CM

## 2015-12-13 DIAGNOSIS — N184 Chronic kidney disease, stage 4 (severe): Secondary | ICD-10-CM

## 2015-12-13 DIAGNOSIS — D571 Sickle-cell disease without crisis: Secondary | ICD-10-CM

## 2015-12-13 DIAGNOSIS — I129 Hypertensive chronic kidney disease with stage 1 through stage 4 chronic kidney disease, or unspecified chronic kidney disease: Secondary | ICD-10-CM | POA: Diagnosis not present

## 2015-12-13 DIAGNOSIS — N189 Chronic kidney disease, unspecified: Principal | ICD-10-CM

## 2015-12-13 DIAGNOSIS — D509 Iron deficiency anemia, unspecified: Secondary | ICD-10-CM

## 2015-12-13 LAB — SAMPLE TO BLOOD BANK

## 2015-12-13 LAB — HEMOGLOBIN: HEMOGLOBIN: 6.1 g/dL — AB (ref 12.0–16.0)

## 2015-12-13 LAB — HEMATOCRIT: HEMATOCRIT: 16.6 % — AB (ref 35.0–47.0)

## 2015-12-13 MED ORDER — EPOETIN ALFA 40000 UNIT/ML IJ SOLN
40000.0000 [IU] | Freq: Once | INTRAMUSCULAR | Status: AC
  Administered 2015-12-13: 40000 [IU] via SUBCUTANEOUS
  Filled 2015-12-13: qty 1

## 2015-12-16 ENCOUNTER — Inpatient Hospital Stay: Payer: BLUE CROSS/BLUE SHIELD

## 2015-12-16 ENCOUNTER — Other Ambulatory Visit: Payer: Self-pay | Admitting: *Deleted

## 2015-12-16 ENCOUNTER — Other Ambulatory Visit: Payer: Self-pay | Admitting: Internal Medicine

## 2015-12-16 VITALS — BP 151/66 | HR 56 | Resp 18

## 2015-12-16 DIAGNOSIS — D631 Anemia in chronic kidney disease: Secondary | ICD-10-CM

## 2015-12-16 DIAGNOSIS — N189 Chronic kidney disease, unspecified: Principal | ICD-10-CM

## 2015-12-16 DIAGNOSIS — N184 Chronic kidney disease, stage 4 (severe): Secondary | ICD-10-CM

## 2015-12-16 DIAGNOSIS — I129 Hypertensive chronic kidney disease with stage 1 through stage 4 chronic kidney disease, or unspecified chronic kidney disease: Secondary | ICD-10-CM | POA: Diagnosis not present

## 2015-12-16 DIAGNOSIS — D571 Sickle-cell disease without crisis: Secondary | ICD-10-CM

## 2015-12-16 DIAGNOSIS — D509 Iron deficiency anemia, unspecified: Secondary | ICD-10-CM

## 2015-12-16 LAB — HEMATOCRIT: HEMATOCRIT: 16.8 % — AB (ref 35.0–47.0)

## 2015-12-16 LAB — SAMPLE TO BLOOD BANK

## 2015-12-16 LAB — PREPARE RBC (CROSSMATCH)

## 2015-12-16 LAB — HEMOGLOBIN: HEMOGLOBIN: 6.1 g/dL — AB (ref 12.0–16.0)

## 2015-12-16 MED ORDER — EPOETIN ALFA 40000 UNIT/ML IJ SOLN
40000.0000 [IU] | Freq: Once | INTRAMUSCULAR | Status: AC
  Administered 2015-12-16: 40000 [IU] via SUBCUTANEOUS
  Filled 2015-12-16: qty 1

## 2015-12-16 NOTE — Progress Notes (Signed)
Patient is here for Procrit injection today and she has concerns of worsening leg weakness, generalized weakness, and SOBr.  Also states that these are the symptoms that she exhibits when blood transfusion is needed.  I spoke with Dr. Janeth Rase and he would like for patient to be scheduled for labs/2 units of blood tomorrow (this was coordinated with scheduler).

## 2015-12-17 ENCOUNTER — Inpatient Hospital Stay: Payer: BLUE CROSS/BLUE SHIELD

## 2015-12-17 VITALS — BP 143/64 | HR 66 | Temp 97.6°F | Resp 20

## 2015-12-17 DIAGNOSIS — D631 Anemia in chronic kidney disease: Secondary | ICD-10-CM

## 2015-12-17 DIAGNOSIS — I129 Hypertensive chronic kidney disease with stage 1 through stage 4 chronic kidney disease, or unspecified chronic kidney disease: Secondary | ICD-10-CM | POA: Diagnosis not present

## 2015-12-17 DIAGNOSIS — N184 Chronic kidney disease, stage 4 (severe): Principal | ICD-10-CM

## 2015-12-17 DIAGNOSIS — N189 Chronic kidney disease, unspecified: Principal | ICD-10-CM

## 2015-12-17 LAB — COMPREHENSIVE METABOLIC PANEL
ALT: 22 U/L (ref 14–54)
AST: 46 U/L — ABNORMAL HIGH (ref 15–41)
Albumin: 3.4 g/dL — ABNORMAL LOW (ref 3.5–5.0)
Alkaline Phosphatase: 154 U/L — ABNORMAL HIGH (ref 38–126)
Anion gap: 8 (ref 5–15)
BUN: 76 mg/dL — ABNORMAL HIGH (ref 6–20)
CO2: 25 mmol/L (ref 22–32)
CREATININE: 3.41 mg/dL — AB (ref 0.44–1.00)
Calcium: 9 mg/dL (ref 8.9–10.3)
Chloride: 103 mmol/L (ref 101–111)
GFR, EST AFRICAN AMERICAN: 15 mL/min — AB (ref >60)
GFR, EST NON AFRICAN AMERICAN: 13 mL/min — AB (ref >60)
Glucose, Bld: 110 mg/dL — ABNORMAL HIGH (ref 65–99)
POTASSIUM: 5.2 mmol/L — AB (ref 3.5–5.1)
Sodium: 136 mmol/L (ref 135–145)
Total Bilirubin: 6.1 mg/dL — ABNORMAL HIGH (ref 0.3–1.2)
Total Protein: 6.6 g/dL (ref 6.5–8.1)

## 2015-12-17 LAB — CBC WITH DIFFERENTIAL/PLATELET
BASOS ABS: 0.2 10*3/uL — AB (ref 0–0.1)
BASOS PCT: 2 %
EOS ABS: 0 10*3/uL (ref 0–0.7)
EOS PCT: 0 %
HCT: 17.7 % — ABNORMAL LOW (ref 35.0–47.0)
Hemoglobin: 6.4 g/dL — ABNORMAL LOW (ref 12.0–16.0)
Lymphocytes Relative: 16 %
Lymphs Abs: 1.4 10*3/uL (ref 1.0–3.6)
MCH: 39.4 pg — AB (ref 26.0–34.0)
MCHC: 35.9 g/dL (ref 32.0–36.0)
MCV: 109.9 fL — AB (ref 80.0–100.0)
Monocytes Absolute: 1.3 10*3/uL — ABNORMAL HIGH (ref 0.2–0.9)
Monocytes Relative: 15 %
Neutro Abs: 5.8 10*3/uL (ref 1.4–6.5)
Neutrophils Relative %: 67 %
Platelets: 211 10*3/uL (ref 150–440)
RBC: 1.61 MIL/uL — AB (ref 3.80–5.20)
RDW: 33.5 % — AB (ref 11.5–14.5)
WBC: 8.6 10*3/uL (ref 3.6–11.0)
nRBC: 66 /100 WBC — ABNORMAL HIGH

## 2015-12-17 LAB — LACTATE DEHYDROGENASE: LDH: 707 U/L — AB (ref 98–192)

## 2015-12-17 MED ORDER — DIPHENHYDRAMINE HCL 25 MG PO CAPS
25.0000 mg | ORAL_CAPSULE | Freq: Once | ORAL | Status: AC
  Administered 2015-12-17: 25 mg via ORAL
  Filled 2015-12-17: qty 1

## 2015-12-17 MED ORDER — ACETAMINOPHEN 325 MG PO TABS
650.0000 mg | ORAL_TABLET | Freq: Once | ORAL | Status: AC
  Administered 2015-12-17: 650 mg via ORAL
  Filled 2015-12-17: qty 2

## 2015-12-17 MED ORDER — SODIUM CHLORIDE 0.9 % IV SOLN
250.0000 mL | Freq: Once | INTRAVENOUS | Status: AC
  Administered 2015-12-17: 250 mL via INTRAVENOUS
  Filled 2015-12-17: qty 250

## 2015-12-17 MED ORDER — FUROSEMIDE 10 MG/ML IJ SOLN
20.0000 mg | Freq: Once | INTRAMUSCULAR | Status: AC
Start: 2015-12-17 — End: 2015-12-17
  Administered 2015-12-17: 20 mg via INTRAVENOUS
  Filled 2015-12-17: qty 2

## 2015-12-18 LAB — TYPE AND SCREEN
ABO/RH(D): O POS
ANTIBODY SCREEN: NEGATIVE
UNIT DIVISION: 0
Unit division: 0

## 2015-12-20 ENCOUNTER — Inpatient Hospital Stay: Payer: BLUE CROSS/BLUE SHIELD

## 2015-12-20 VITALS — BP 138/47 | HR 55 | Resp 20

## 2015-12-20 DIAGNOSIS — N184 Chronic kidney disease, stage 4 (severe): Secondary | ICD-10-CM

## 2015-12-20 DIAGNOSIS — D571 Sickle-cell disease without crisis: Secondary | ICD-10-CM

## 2015-12-20 DIAGNOSIS — D509 Iron deficiency anemia, unspecified: Secondary | ICD-10-CM

## 2015-12-20 DIAGNOSIS — I129 Hypertensive chronic kidney disease with stage 1 through stage 4 chronic kidney disease, or unspecified chronic kidney disease: Secondary | ICD-10-CM | POA: Diagnosis not present

## 2015-12-20 DIAGNOSIS — N189 Chronic kidney disease, unspecified: Principal | ICD-10-CM

## 2015-12-20 DIAGNOSIS — D631 Anemia in chronic kidney disease: Secondary | ICD-10-CM

## 2015-12-20 LAB — SAMPLE TO BLOOD BANK

## 2015-12-20 LAB — HEMOGLOBIN: Hemoglobin: 8.3 g/dL — ABNORMAL LOW (ref 12.0–16.0)

## 2015-12-20 LAB — HEMATOCRIT: HCT: 23.4 % — ABNORMAL LOW (ref 35.0–47.0)

## 2015-12-20 MED ORDER — EPOETIN ALFA 40000 UNIT/ML IJ SOLN
40000.0000 [IU] | Freq: Once | INTRAMUSCULAR | Status: AC
  Administered 2015-12-20: 40000 [IU] via SUBCUTANEOUS
  Filled 2015-12-20: qty 1

## 2015-12-23 ENCOUNTER — Other Ambulatory Visit: Payer: Self-pay

## 2015-12-23 ENCOUNTER — Inpatient Hospital Stay: Payer: BLUE CROSS/BLUE SHIELD

## 2015-12-23 ENCOUNTER — Telehealth: Payer: Self-pay | Admitting: *Deleted

## 2015-12-23 VITALS — BP 115/63 | HR 61 | Resp 18

## 2015-12-23 DIAGNOSIS — D571 Sickle-cell disease without crisis: Secondary | ICD-10-CM

## 2015-12-23 DIAGNOSIS — N189 Chronic kidney disease, unspecified: Principal | ICD-10-CM

## 2015-12-23 DIAGNOSIS — N184 Chronic kidney disease, stage 4 (severe): Secondary | ICD-10-CM

## 2015-12-23 DIAGNOSIS — D631 Anemia in chronic kidney disease: Secondary | ICD-10-CM

## 2015-12-23 DIAGNOSIS — N183 Chronic kidney disease, stage 3 unspecified: Secondary | ICD-10-CM

## 2015-12-23 DIAGNOSIS — I129 Hypertensive chronic kidney disease with stage 1 through stage 4 chronic kidney disease, or unspecified chronic kidney disease: Secondary | ICD-10-CM | POA: Diagnosis not present

## 2015-12-23 DIAGNOSIS — D509 Iron deficiency anemia, unspecified: Secondary | ICD-10-CM

## 2015-12-23 LAB — URINALYSIS COMPLETE WITH MICROSCOPIC (ARMC ONLY)
BACTERIA UA: NONE SEEN
BILIRUBIN URINE: NEGATIVE
GLUCOSE, UA: NEGATIVE mg/dL
HGB URINE DIPSTICK: NEGATIVE
Ketones, ur: NEGATIVE mg/dL
LEUKOCYTES UA: NEGATIVE
NITRITE: NEGATIVE
PH: 6 (ref 5.0–8.0)
Protein, ur: NEGATIVE mg/dL
SPECIFIC GRAVITY, URINE: 1.008 (ref 1.005–1.030)

## 2015-12-23 LAB — SAMPLE TO BLOOD BANK

## 2015-12-23 LAB — PROTEIN / CREATININE RATIO, URINE: Creatinine, Urine: 53 mg/dL

## 2015-12-23 LAB — HEMATOCRIT: HCT: 20.8 % — ABNORMAL LOW (ref 35.0–47.0)

## 2015-12-23 LAB — HEMOGLOBIN: HEMOGLOBIN: 7.2 g/dL — AB (ref 12.0–16.0)

## 2015-12-23 MED ORDER — EPOETIN ALFA 40000 UNIT/ML IJ SOLN
40000.0000 [IU] | Freq: Once | INTRAMUSCULAR | Status: AC
  Administered 2015-12-23: 40000 [IU] via SUBCUTANEOUS

## 2015-12-24 LAB — PTH, INTACT AND CALCIUM
Calcium, Total (PTH): 8.4 mg/dL — ABNORMAL LOW (ref 8.7–10.3)
PTH: 377 pg/mL — AB (ref 15–65)

## 2015-12-26 ENCOUNTER — Inpatient Hospital Stay: Payer: BLUE CROSS/BLUE SHIELD

## 2015-12-26 VITALS — BP 115/61 | HR 60 | Temp 97.0°F | Resp 18

## 2015-12-26 DIAGNOSIS — I129 Hypertensive chronic kidney disease with stage 1 through stage 4 chronic kidney disease, or unspecified chronic kidney disease: Secondary | ICD-10-CM | POA: Diagnosis not present

## 2015-12-26 DIAGNOSIS — D631 Anemia in chronic kidney disease: Secondary | ICD-10-CM

## 2015-12-26 DIAGNOSIS — N189 Chronic kidney disease, unspecified: Principal | ICD-10-CM

## 2015-12-26 MED ORDER — SODIUM CHLORIDE 0.9 % IV SOLN
Freq: Once | INTRAVENOUS | Status: AC
  Administered 2015-12-26: 14:00:00 via INTRAVENOUS
  Filled 2015-12-26: qty 1000

## 2015-12-26 MED ORDER — SODIUM CHLORIDE 0.9 % IV SOLN
510.0000 mg | Freq: Once | INTRAVENOUS | Status: AC
  Administered 2015-12-26: 510 mg via INTRAVENOUS
  Filled 2015-12-26: qty 17

## 2015-12-27 ENCOUNTER — Inpatient Hospital Stay: Payer: BLUE CROSS/BLUE SHIELD

## 2015-12-27 ENCOUNTER — Ambulatory Visit: Payer: BLUE CROSS/BLUE SHIELD

## 2015-12-27 VITALS — BP 128/61 | HR 64 | Resp 18

## 2015-12-27 DIAGNOSIS — D571 Sickle-cell disease without crisis: Secondary | ICD-10-CM

## 2015-12-27 DIAGNOSIS — D631 Anemia in chronic kidney disease: Secondary | ICD-10-CM

## 2015-12-27 DIAGNOSIS — N189 Chronic kidney disease, unspecified: Principal | ICD-10-CM

## 2015-12-27 DIAGNOSIS — D509 Iron deficiency anemia, unspecified: Secondary | ICD-10-CM

## 2015-12-27 DIAGNOSIS — I129 Hypertensive chronic kidney disease with stage 1 through stage 4 chronic kidney disease, or unspecified chronic kidney disease: Secondary | ICD-10-CM | POA: Diagnosis not present

## 2015-12-27 DIAGNOSIS — N184 Chronic kidney disease, stage 4 (severe): Secondary | ICD-10-CM

## 2015-12-27 LAB — SAMPLE TO BLOOD BANK

## 2015-12-27 LAB — HEMATOCRIT: HEMATOCRIT: 18.1 % — AB (ref 35.0–47.0)

## 2015-12-27 LAB — HEMOGLOBIN: Hemoglobin: 6.5 g/dL — ABNORMAL LOW (ref 12.0–16.0)

## 2015-12-27 MED ORDER — EPOETIN ALFA 40000 UNIT/ML IJ SOLN
40000.0000 [IU] | Freq: Once | INTRAMUSCULAR | Status: AC
  Administered 2015-12-27: 40000 [IU] via SUBCUTANEOUS
  Filled 2015-12-27: qty 1

## 2015-12-27 NOTE — Telephone Encounter (Signed)
Erroneous encounter

## 2015-12-30 ENCOUNTER — Ambulatory Visit: Payer: BLUE CROSS/BLUE SHIELD

## 2015-12-30 ENCOUNTER — Inpatient Hospital Stay: Payer: BLUE CROSS/BLUE SHIELD

## 2015-12-30 VITALS — BP 114/64 | HR 64 | Resp 18

## 2015-12-30 DIAGNOSIS — I129 Hypertensive chronic kidney disease with stage 1 through stage 4 chronic kidney disease, or unspecified chronic kidney disease: Secondary | ICD-10-CM | POA: Diagnosis not present

## 2015-12-30 DIAGNOSIS — D509 Iron deficiency anemia, unspecified: Secondary | ICD-10-CM

## 2015-12-30 DIAGNOSIS — D631 Anemia in chronic kidney disease: Secondary | ICD-10-CM

## 2015-12-30 DIAGNOSIS — N189 Chronic kidney disease, unspecified: Principal | ICD-10-CM

## 2015-12-30 DIAGNOSIS — N184 Chronic kidney disease, stage 4 (severe): Secondary | ICD-10-CM

## 2015-12-30 DIAGNOSIS — D571 Sickle-cell disease without crisis: Secondary | ICD-10-CM

## 2015-12-30 LAB — SAMPLE TO BLOOD BANK

## 2015-12-30 LAB — HEMOGLOBIN: HEMOGLOBIN: 6.7 g/dL — AB (ref 12.0–16.0)

## 2015-12-30 LAB — HEMATOCRIT: HEMATOCRIT: 18.7 % — AB (ref 35.0–47.0)

## 2015-12-30 MED ORDER — EPOETIN ALFA 40000 UNIT/ML IJ SOLN
40000.0000 [IU] | Freq: Once | INTRAMUSCULAR | Status: AC
  Administered 2015-12-30: 40000 [IU] via SUBCUTANEOUS
  Filled 2015-12-30: qty 1

## 2016-01-03 ENCOUNTER — Inpatient Hospital Stay: Payer: BLUE CROSS/BLUE SHIELD | Attending: Internal Medicine

## 2016-01-03 ENCOUNTER — Inpatient Hospital Stay: Payer: BLUE CROSS/BLUE SHIELD

## 2016-01-03 VITALS — BP 119/61 | HR 64 | Resp 18

## 2016-01-03 DIAGNOSIS — N189 Chronic kidney disease, unspecified: Principal | ICD-10-CM

## 2016-01-03 DIAGNOSIS — J449 Chronic obstructive pulmonary disease, unspecified: Secondary | ICD-10-CM | POA: Insufficient documentation

## 2016-01-03 DIAGNOSIS — L439 Lichen planus, unspecified: Secondary | ICD-10-CM | POA: Insufficient documentation

## 2016-01-03 DIAGNOSIS — Z803 Family history of malignant neoplasm of breast: Secondary | ICD-10-CM | POA: Diagnosis not present

## 2016-01-03 DIAGNOSIS — M25551 Pain in right hip: Secondary | ICD-10-CM | POA: Diagnosis not present

## 2016-01-03 DIAGNOSIS — G111 Early-onset cerebellar ataxia: Secondary | ICD-10-CM | POA: Diagnosis not present

## 2016-01-03 DIAGNOSIS — I4891 Unspecified atrial fibrillation: Secondary | ICD-10-CM | POA: Insufficient documentation

## 2016-01-03 DIAGNOSIS — I509 Heart failure, unspecified: Secondary | ICD-10-CM | POA: Insufficient documentation

## 2016-01-03 DIAGNOSIS — I129 Hypertensive chronic kidney disease with stage 1 through stage 4 chronic kidney disease, or unspecified chronic kidney disease: Secondary | ICD-10-CM | POA: Diagnosis present

## 2016-01-03 DIAGNOSIS — M818 Other osteoporosis without current pathological fracture: Secondary | ICD-10-CM | POA: Diagnosis not present

## 2016-01-03 DIAGNOSIS — M109 Gout, unspecified: Secondary | ICD-10-CM | POA: Insufficient documentation

## 2016-01-03 DIAGNOSIS — D631 Anemia in chronic kidney disease: Secondary | ICD-10-CM | POA: Diagnosis not present

## 2016-01-03 DIAGNOSIS — I38 Endocarditis, valve unspecified: Secondary | ICD-10-CM | POA: Diagnosis not present

## 2016-01-03 DIAGNOSIS — D696 Thrombocytopenia, unspecified: Secondary | ICD-10-CM | POA: Insufficient documentation

## 2016-01-03 DIAGNOSIS — N184 Chronic kidney disease, stage 4 (severe): Secondary | ICD-10-CM | POA: Diagnosis not present

## 2016-01-03 DIAGNOSIS — E559 Vitamin D deficiency, unspecified: Secondary | ICD-10-CM | POA: Diagnosis not present

## 2016-01-03 DIAGNOSIS — D571 Sickle-cell disease without crisis: Secondary | ICD-10-CM | POA: Diagnosis not present

## 2016-01-03 DIAGNOSIS — K7689 Other specified diseases of liver: Secondary | ICD-10-CM | POA: Diagnosis not present

## 2016-01-03 DIAGNOSIS — Z79899 Other long term (current) drug therapy: Secondary | ICD-10-CM | POA: Diagnosis not present

## 2016-01-03 DIAGNOSIS — N179 Acute kidney failure, unspecified: Secondary | ICD-10-CM | POA: Diagnosis not present

## 2016-01-03 DIAGNOSIS — Z87891 Personal history of nicotine dependence: Secondary | ICD-10-CM | POA: Insufficient documentation

## 2016-01-03 DIAGNOSIS — D509 Iron deficiency anemia, unspecified: Secondary | ICD-10-CM

## 2016-01-03 LAB — HEMATOCRIT: HCT: 17.3 % — ABNORMAL LOW (ref 35.0–47.0)

## 2016-01-03 LAB — SAMPLE TO BLOOD BANK

## 2016-01-03 LAB — HEMOGLOBIN: Hemoglobin: 6.3 g/dL — ABNORMAL LOW (ref 12.0–16.0)

## 2016-01-03 MED ORDER — EPOETIN ALFA 40000 UNIT/ML IJ SOLN
40000.0000 [IU] | Freq: Once | INTRAMUSCULAR | Status: AC
  Administered 2016-01-03: 40000 [IU] via SUBCUTANEOUS
  Filled 2016-01-03: qty 1

## 2016-01-06 ENCOUNTER — Inpatient Hospital Stay: Payer: BLUE CROSS/BLUE SHIELD

## 2016-01-06 ENCOUNTER — Other Ambulatory Visit: Payer: Self-pay | Admitting: *Deleted

## 2016-01-06 ENCOUNTER — Other Ambulatory Visit: Payer: Self-pay | Admitting: Internal Medicine

## 2016-01-06 ENCOUNTER — Telehealth: Payer: Self-pay

## 2016-01-06 VITALS — BP 132/69 | HR 57

## 2016-01-06 DIAGNOSIS — D571 Sickle-cell disease without crisis: Secondary | ICD-10-CM

## 2016-01-06 DIAGNOSIS — D631 Anemia in chronic kidney disease: Secondary | ICD-10-CM

## 2016-01-06 DIAGNOSIS — N189 Chronic kidney disease, unspecified: Principal | ICD-10-CM

## 2016-01-06 DIAGNOSIS — N184 Chronic kidney disease, stage 4 (severe): Secondary | ICD-10-CM

## 2016-01-06 DIAGNOSIS — D509 Iron deficiency anemia, unspecified: Secondary | ICD-10-CM

## 2016-01-06 DIAGNOSIS — I129 Hypertensive chronic kidney disease with stage 1 through stage 4 chronic kidney disease, or unspecified chronic kidney disease: Secondary | ICD-10-CM | POA: Diagnosis not present

## 2016-01-06 LAB — PREPARE RBC (CROSSMATCH)

## 2016-01-06 LAB — HEMOGLOBIN: Hemoglobin: 6 g/dL — ABNORMAL LOW (ref 12.0–16.0)

## 2016-01-06 LAB — SAMPLE TO BLOOD BANK

## 2016-01-06 LAB — HEMATOCRIT: HEMATOCRIT: 16.9 % — AB (ref 35.0–47.0)

## 2016-01-06 MED ORDER — HEPARIN SOD (PORK) LOCK FLUSH 100 UNIT/ML IV SOLN: 250.0000 [IU] | INTRAVENOUS | Status: AC | PRN

## 2016-01-06 MED ORDER — DIPHENHYDRAMINE HCL 25 MG PO CAPS: 25.0000 mg | ORAL_CAPSULE | Freq: Once | ORAL | Status: AC

## 2016-01-06 MED ORDER — FUROSEMIDE 10 MG/ML IJ SOLN: 20.0000 mg | Freq: Once | INTRAMUSCULAR | Status: AC

## 2016-01-06 MED ORDER — ACETAMINOPHEN 325 MG PO TABS: 650.0000 mg | ORAL_TABLET | Freq: Once | ORAL | Status: AC

## 2016-01-06 MED ORDER — SODIUM CHLORIDE 0.9% FLUSH
10.0000 mL | INTRAVENOUS | Status: AC | PRN
  Filled 2016-01-06: qty 10

## 2016-01-06 MED ORDER — SODIUM CHLORIDE 0.9% FLUSH
3.0000 mL | INTRAVENOUS | Status: AC | PRN
  Filled 2016-01-06: qty 3

## 2016-01-06 MED ORDER — HEPARIN SOD (PORK) LOCK FLUSH 100 UNIT/ML IV SOLN: 500.0000 [IU] | Freq: Every day | INTRAVENOUS | Status: AC | PRN

## 2016-01-06 MED ORDER — EPOETIN ALFA 40000 UNIT/ML IJ SOLN
40000.0000 [IU] | Freq: Once | INTRAMUSCULAR | Status: AC
Start: 2016-01-06 — End: 2016-01-06
  Administered 2016-01-06: 40000 [IU] via SUBCUTANEOUS
  Filled 2016-01-06: qty 1

## 2016-01-06 MED ORDER — SODIUM CHLORIDE 0.9 % IV SOLN
250.0000 mL | Freq: Once | INTRAVENOUS | Status: AC
  Administered 2016-02-27: 16:00:00 via INTRAVENOUS
  Filled 2016-01-06: qty 250

## 2016-01-06 NOTE — Telephone Encounter (Addendum)
Patient is here for Procrit injeciton.  Her hemoglobin today is 6.0 and she is having left side hip/leg pain, which is usually a symptom that she is in need of blood transfusion, with an increase in weakness.  She has asking if she can receive a blood transfusion, possibly tomorrow.  Please call patient with response.  Thanks

## 2016-01-06 NOTE — Telephone Encounter (Signed)
V/o Dr. Rogue Bussing to sch. Pt for 1 unit of blood tomorrow 01/07/16. msg sent to scheduling to arrange.  Blood bank orders--pending.

## 2016-01-07 ENCOUNTER — Inpatient Hospital Stay: Payer: BLUE CROSS/BLUE SHIELD

## 2016-01-07 ENCOUNTER — Other Ambulatory Visit: Payer: Self-pay | Admitting: Internal Medicine

## 2016-01-07 VITALS — BP 119/63 | HR 57 | Temp 96.5°F | Resp 18

## 2016-01-07 DIAGNOSIS — D631 Anemia in chronic kidney disease: Secondary | ICD-10-CM

## 2016-01-07 DIAGNOSIS — I129 Hypertensive chronic kidney disease with stage 1 through stage 4 chronic kidney disease, or unspecified chronic kidney disease: Secondary | ICD-10-CM | POA: Diagnosis not present

## 2016-01-07 DIAGNOSIS — D571 Sickle-cell disease without crisis: Secondary | ICD-10-CM

## 2016-01-07 DIAGNOSIS — N184 Chronic kidney disease, stage 4 (severe): Secondary | ICD-10-CM

## 2016-01-07 DIAGNOSIS — D638 Anemia in other chronic diseases classified elsewhere: Secondary | ICD-10-CM

## 2016-01-07 LAB — PREPARE RBC (CROSSMATCH)

## 2016-01-07 MED ORDER — ACETAMINOPHEN 325 MG PO TABS
650.0000 mg | ORAL_TABLET | Freq: Once | ORAL | Status: AC
  Administered 2016-01-07: 650 mg via ORAL
  Filled 2016-01-07: qty 2

## 2016-01-07 MED ORDER — HEPARIN SOD (PORK) LOCK FLUSH 100 UNIT/ML IV SOLN: 500.0000 [IU] | Freq: Every day | INTRAVENOUS | Status: AC | PRN

## 2016-01-07 MED ORDER — FUROSEMIDE 10 MG/ML IJ SOLN: 20.0000 mg | Freq: Once | INTRAMUSCULAR | Status: AC

## 2016-01-07 MED ORDER — SODIUM CHLORIDE 0.9% FLUSH
10.0000 mL | INTRAVENOUS | Status: AC | PRN
  Filled 2016-01-07: qty 10

## 2016-01-07 MED ORDER — SODIUM CHLORIDE 0.9% FLUSH
3.0000 mL | INTRAVENOUS | Status: AC | PRN
  Filled 2016-01-07: qty 3

## 2016-01-07 MED ORDER — HEPARIN SOD (PORK) LOCK FLUSH 100 UNIT/ML IV SOLN: 250.0000 [IU] | INTRAVENOUS | Status: AC | PRN

## 2016-01-07 MED ORDER — FUROSEMIDE 10 MG/ML IJ SOLN
20.0000 mg | Freq: Once | INTRAMUSCULAR | Status: AC
  Administered 2016-01-07: 20 mg via INTRAVENOUS
  Filled 2016-01-07: qty 2

## 2016-01-07 MED ORDER — ACETAMINOPHEN 325 MG PO TABS: 650.0000 mg | ORAL_TABLET | Freq: Once | ORAL | Status: AC

## 2016-01-07 MED ORDER — DIPHENHYDRAMINE HCL 25 MG PO CAPS: 25.0000 mg | ORAL_CAPSULE | Freq: Once | ORAL | Status: AC

## 2016-01-07 MED ORDER — DIPHENHYDRAMINE HCL 25 MG PO CAPS
25.0000 mg | ORAL_CAPSULE | Freq: Once | ORAL | Status: AC
  Administered 2016-01-07: 25 mg via ORAL
  Filled 2016-01-07: qty 1

## 2016-01-07 MED ORDER — SODIUM CHLORIDE 0.9 % IV SOLN
250.0000 mL | Freq: Once | INTRAVENOUS | Status: AC
  Filled 2016-01-07: qty 250

## 2016-01-07 MED ORDER — SODIUM CHLORIDE 0.9 % IV SOLN
250.0000 mL | Freq: Once | INTRAVENOUS | Status: AC
  Administered 2016-01-07: 250 mL via INTRAVENOUS
  Filled 2016-01-07: qty 250

## 2016-01-08 ENCOUNTER — Ambulatory Visit: Payer: BLUE CROSS/BLUE SHIELD | Attending: Pulmonary Disease

## 2016-01-08 DIAGNOSIS — I4891 Unspecified atrial fibrillation: Secondary | ICD-10-CM | POA: Diagnosis not present

## 2016-01-08 DIAGNOSIS — N183 Chronic kidney disease, stage 3 (moderate): Secondary | ICD-10-CM | POA: Diagnosis not present

## 2016-01-08 DIAGNOSIS — I509 Heart failure, unspecified: Secondary | ICD-10-CM | POA: Diagnosis not present

## 2016-01-08 DIAGNOSIS — G4733 Obstructive sleep apnea (adult) (pediatric): Secondary | ICD-10-CM | POA: Diagnosis present

## 2016-01-08 LAB — TYPE AND SCREEN
ABO/RH(D): O POS
ANTIBODY SCREEN: NEGATIVE
Unit division: 0

## 2016-01-09 ENCOUNTER — Inpatient Hospital Stay: Payer: BLUE CROSS/BLUE SHIELD

## 2016-01-09 VITALS — BP 134/68 | HR 57 | Temp 96.5°F | Resp 18

## 2016-01-09 DIAGNOSIS — N189 Chronic kidney disease, unspecified: Principal | ICD-10-CM

## 2016-01-09 DIAGNOSIS — D631 Anemia in chronic kidney disease: Secondary | ICD-10-CM

## 2016-01-09 DIAGNOSIS — I129 Hypertensive chronic kidney disease with stage 1 through stage 4 chronic kidney disease, or unspecified chronic kidney disease: Secondary | ICD-10-CM | POA: Diagnosis not present

## 2016-01-09 MED ORDER — SODIUM CHLORIDE 0.9 % IV SOLN
Freq: Once | INTRAVENOUS | Status: AC
  Administered 2016-01-09: 14:00:00 via INTRAVENOUS
  Filled 2016-01-09: qty 1000

## 2016-01-09 MED ORDER — SODIUM CHLORIDE 0.9 % IV SOLN
510.0000 mg | Freq: Once | INTRAVENOUS | Status: AC
  Administered 2016-01-09: 510 mg via INTRAVENOUS
  Filled 2016-01-09: qty 17

## 2016-01-10 ENCOUNTER — Ambulatory Visit: Payer: BLUE CROSS/BLUE SHIELD

## 2016-01-13 ENCOUNTER — Inpatient Hospital Stay: Payer: BLUE CROSS/BLUE SHIELD

## 2016-01-13 VITALS — BP 149/68 | HR 57 | Temp 96.9°F | Resp 18

## 2016-01-13 DIAGNOSIS — D631 Anemia in chronic kidney disease: Secondary | ICD-10-CM

## 2016-01-13 DIAGNOSIS — N189 Chronic kidney disease, unspecified: Principal | ICD-10-CM

## 2016-01-13 DIAGNOSIS — D571 Sickle-cell disease without crisis: Secondary | ICD-10-CM

## 2016-01-13 DIAGNOSIS — N184 Chronic kidney disease, stage 4 (severe): Secondary | ICD-10-CM

## 2016-01-13 DIAGNOSIS — I129 Hypertensive chronic kidney disease with stage 1 through stage 4 chronic kidney disease, or unspecified chronic kidney disease: Secondary | ICD-10-CM | POA: Diagnosis not present

## 2016-01-13 DIAGNOSIS — D509 Iron deficiency anemia, unspecified: Secondary | ICD-10-CM

## 2016-01-13 LAB — HEMATOCRIT: HCT: 18.3 % — ABNORMAL LOW (ref 35.0–47.0)

## 2016-01-13 LAB — SAMPLE TO BLOOD BANK

## 2016-01-13 LAB — HEMOGLOBIN: Hemoglobin: 6.7 g/dL — ABNORMAL LOW (ref 12.0–16.0)

## 2016-01-13 MED ORDER — EPOETIN ALFA 40000 UNIT/ML IJ SOLN
40000.0000 [IU] | Freq: Once | INTRAMUSCULAR | Status: AC
  Administered 2016-01-13: 40000 [IU] via SUBCUTANEOUS
  Filled 2016-01-13: qty 1

## 2016-01-17 ENCOUNTER — Other Ambulatory Visit: Payer: Self-pay | Admitting: Internal Medicine

## 2016-01-17 ENCOUNTER — Inpatient Hospital Stay: Payer: BLUE CROSS/BLUE SHIELD

## 2016-01-17 ENCOUNTER — Ambulatory Visit
Admission: RE | Admit: 2016-01-17 | Discharge: 2016-01-17 | Disposition: A | Discharge status: Home / Self Care | Payer: BLUE CROSS/BLUE SHIELD | Source: Ambulatory Visit | Attending: Internal Medicine | Admitting: Internal Medicine

## 2016-01-17 ENCOUNTER — Other Ambulatory Visit: Payer: Self-pay

## 2016-01-17 ENCOUNTER — Telehealth: Payer: Self-pay

## 2016-01-17 ENCOUNTER — Inpatient Hospital Stay (HOSPITAL_BASED_OUTPATIENT_CLINIC_OR_DEPARTMENT_OTHER): Payer: BLUE CROSS/BLUE SHIELD | Admitting: Internal Medicine

## 2016-01-17 VITALS — BP 125/61 | HR 64 | Temp 98.4°F | Resp 18 | Wt 195.5 lb

## 2016-01-17 DIAGNOSIS — M25551 Pain in right hip: Secondary | ICD-10-CM

## 2016-01-17 DIAGNOSIS — G4730 Sleep apnea, unspecified: Secondary | ICD-10-CM | POA: Diagnosis not present

## 2016-01-17 DIAGNOSIS — N179 Acute kidney failure, unspecified: Secondary | ICD-10-CM

## 2016-01-17 DIAGNOSIS — N189 Chronic kidney disease, unspecified: Secondary | ICD-10-CM

## 2016-01-17 DIAGNOSIS — Z79899 Other long term (current) drug therapy: Secondary | ICD-10-CM

## 2016-01-17 DIAGNOSIS — E559 Vitamin D deficiency, unspecified: Secondary | ICD-10-CM

## 2016-01-17 DIAGNOSIS — K7689 Other specified diseases of liver: Secondary | ICD-10-CM

## 2016-01-17 DIAGNOSIS — Z87891 Personal history of nicotine dependence: Secondary | ICD-10-CM

## 2016-01-17 DIAGNOSIS — D696 Thrombocytopenia, unspecified: Secondary | ICD-10-CM

## 2016-01-17 DIAGNOSIS — D509 Iron deficiency anemia, unspecified: Secondary | ICD-10-CM

## 2016-01-17 DIAGNOSIS — I4891 Unspecified atrial fibrillation: Secondary | ICD-10-CM

## 2016-01-17 DIAGNOSIS — I129 Hypertensive chronic kidney disease with stage 1 through stage 4 chronic kidney disease, or unspecified chronic kidney disease: Secondary | ICD-10-CM | POA: Diagnosis not present

## 2016-01-17 DIAGNOSIS — I509 Heart failure, unspecified: Secondary | ICD-10-CM

## 2016-01-17 DIAGNOSIS — N184 Chronic kidney disease, stage 4 (severe): Secondary | ICD-10-CM | POA: Diagnosis not present

## 2016-01-17 DIAGNOSIS — D631 Anemia in chronic kidney disease: Secondary | ICD-10-CM

## 2016-01-17 DIAGNOSIS — I38 Endocarditis, valve unspecified: Secondary | ICD-10-CM

## 2016-01-17 DIAGNOSIS — D571 Sickle-cell disease without crisis: Secondary | ICD-10-CM

## 2016-01-17 DIAGNOSIS — N183 Chronic kidney disease, stage 3 unspecified: Secondary | ICD-10-CM

## 2016-01-17 DIAGNOSIS — Z803 Family history of malignant neoplasm of breast: Secondary | ICD-10-CM

## 2016-01-17 DIAGNOSIS — M818 Other osteoporosis without current pathological fracture: Secondary | ICD-10-CM

## 2016-01-17 DIAGNOSIS — G111 Early-onset cerebellar ataxia: Secondary | ICD-10-CM

## 2016-01-17 DIAGNOSIS — M109 Gout, unspecified: Secondary | ICD-10-CM

## 2016-01-17 DIAGNOSIS — J449 Chronic obstructive pulmonary disease, unspecified: Secondary | ICD-10-CM

## 2016-01-17 DIAGNOSIS — L439 Lichen planus, unspecified: Secondary | ICD-10-CM

## 2016-01-17 LAB — CBC WITH DIFFERENTIAL/PLATELET
BASOS ABS: 0 10*3/uL (ref 0–0.1)
Band Neutrophils: 1 %
Basophils Relative: 0 %
EOS ABS: 0.2 10*3/uL (ref 0–0.7)
EOS PCT: 2 %
HCT: 16.7 % — ABNORMAL LOW (ref 35.0–47.0)
Hemoglobin: 5.9 g/dL — ABNORMAL LOW (ref 12.0–16.0)
LYMPHS PCT: 5 %
Lymphs Abs: 0.6 10*3/uL — ABNORMAL LOW (ref 1.0–3.6)
MCH: 38.1 pg — AB (ref 26.0–34.0)
MCHC: 35.4 g/dL (ref 32.0–36.0)
MCV: 107.7 fL — ABNORMAL HIGH (ref 80.0–100.0)
MONOS PCT: 11 %
Monocytes Absolute: 1.3 10*3/uL — ABNORMAL HIGH (ref 0.2–0.9)
NEUTROS PCT: 81 %
Neutro Abs: 10.1 10*3/uL — ABNORMAL HIGH (ref 1.4–6.5)
PLATELETS: 186 10*3/uL (ref 150–440)
RBC: 1.55 MIL/uL — AB (ref 3.80–5.20)
RDW: 33.2 % — ABNORMAL HIGH (ref 11.5–14.5)
WBC: 12.2 10*3/uL — AB (ref 4.0–10.5)

## 2016-01-17 LAB — COMPREHENSIVE METABOLIC PANEL
ALBUMIN: 3.4 g/dL — AB (ref 3.5–5.0)
ALT: 17 U/L (ref 14–54)
AST: 40 U/L (ref 15–41)
Alkaline Phosphatase: 122 U/L (ref 38–126)
Anion gap: 10 (ref 5–15)
BUN: 81 mg/dL — ABNORMAL HIGH (ref 6–20)
CHLORIDE: 101 mmol/L (ref 101–111)
CO2: 24 mmol/L (ref 22–32)
CREATININE: 3.6 mg/dL — AB (ref 0.44–1.00)
Calcium: 8.9 mg/dL (ref 8.9–10.3)
GFR calc non Af Amer: 12 mL/min — ABNORMAL LOW (ref >60)
GFR, EST AFRICAN AMERICAN: 14 mL/min — AB (ref >60)
GLUCOSE: 130 mg/dL — AB (ref 65–99)
Potassium: 4.3 mmol/L (ref 3.5–5.1)
SODIUM: 135 mmol/L (ref 135–145)
Total Bilirubin: 12.4 mg/dL — ABNORMAL HIGH (ref 0.3–1.2)
Total Protein: 6.7 g/dL (ref 6.5–8.1)

## 2016-01-17 LAB — SAMPLE TO BLOOD BANK

## 2016-01-17 MED ORDER — EPOETIN ALFA 40000 UNIT/ML IJ SOLN
40000.0000 [IU] | Freq: Once | INTRAMUSCULAR | Status: AC
  Administered 2016-01-17: 40000 [IU] via SUBCUTANEOUS
  Filled 2016-01-17: qty 1

## 2016-01-17 MED ORDER — HYDROCODONE-ACETAMINOPHEN 5-325 MG PO TABS: 1.0000 | ORAL_TABLET | Freq: Three times a day (TID) | ORAL | 0 refills | Status: DC | PRN

## 2016-01-17 MED ORDER — POTASSIUM CHLORIDE ER 20 MEQ PO TBCR: 20.0000 meq | EXTENDED_RELEASE_TABLET | Freq: Every day | ORAL | 0 refills | Status: DC

## 2016-01-17 NOTE — Progress Notes (Signed)
Attempted to contact patient.  No answer and availability to leave message.

## 2016-01-17 NOTE — Telephone Encounter (Signed)
Called pharmacy to cancel potassium entered in error.  [facility] pharmacist verbalized it will be cancelled.  No other concerns

## 2016-01-17 NOTE — Assessment & Plan Note (Addendum)
#   SEVERE ANEMIA-Multifactorial chronic kidney disease/iron deficiency/sickle cell disease. Brisk bone marrow response  # Severe anemia hemoglobin today 5.9; not responding well;  Increase to  Procrit 40,000 units Monday Wednesday Friday. Plan Starting IV Ferrahem every 2 weeks- patient has good response to IV iron. Ferritin not a good indicator.  # Sickle cell disease- confirmed on hemoglobin electrophoresis. On folic acid once a day. On  Hydrea 500 mg once a day.  #  Chronic kidney disease/ acute renal failure- today creatinine is 3.5 [planning HD/fistula]- not on HD yet.   # COPD/OSA/CHF [Dr.Kasa] on Home O2 -2L [place]; CPAP.   # Right hip pain- check X-ray. Try hydrocodone every 8 hours.   #   Patient will follow-up with me in approximately  3 months  Increase procrit to three times a week;  IV iron every 2 weeks.   # Port placement- refer to Dr.Dew.

## 2016-01-17 NOTE — Telephone Encounter (Signed)
Called and informed pt that per Dr. B her x-rays are negative for any obvious reason for hip pain- if gets worse; needs to follow up.  No other concerns noted.

## 2016-01-17 NOTE — H&P (Signed)
[place] OFFICE PROGRESS NOTE  Patient Care Team: Jackolyn Confer, MD as PCP - General (Internal Medicine) Seeplaputhur Robinette Haines, MD (General Surgery) Rubbie Battiest, NP as Nurse Practitioner (Gerontology)   SUMMARY OF ONCOLOGIC HISTORY:  # Severe Anemia- multifactorial [as per Pt baseline 6-6.5] SCD/CKD- on procrit 40K/IV Iron; May 2017- Procrit 40K W-F; July 2017-Start IV Ferrahem q2W   # SICKLE CELL ANEMIA/ Congestive hepatopathy [Dec 2016- Bili 73s s/p liver bx; [facility]]; May 2017- RestartHydrea 500 qD  # Acute renal failure/ CKD [Dr.Kolluru]/ CHF [Dr.]  # Jan 2017 ? Endocarditis s/p IV Abx/ A. Fib [not on anti-coag]   INTERVAL HISTORY:  65 year old female patient with above history of sickle cell anemia chronic kidney disease/CHF- is here for follow-up.   In the last few weeks patient's hemoglobin has not been coming as expected did while on The Procrit to 40,000 units twice weekly. Patient is getting Feraheme every 2 weeks.  Patient states no worsening fatigue. She has been started on oxygen by pulmonary.Otherwise no fever no chills.  No nausea no vomiting.  REVIEW OF SYSTEMS:  A complete 10 point review of system is done which is negative except mentioned above/history of present illness.   PAST MEDICAL HISTORY :  Past Medical History:  Diagnosis Date  . Anemia   . 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 Cheree Ditto at [facility]  . CHF (congestive heart failure) ([place])   . Congestive heart failure (CHF) ([place])   . Congestive heart failure (CHF) ([place]) 06/2015  . COPD (chronic obstructive pulmonary disease) (HCC)    symptoms Dr. Patricia Pesa   . Gout   . Hypertension   . Lichen planus   . Osteoporosis 2011   osteopenia  . SCA-1 (spinocerebellar ataxia type 1) ([place])   . Sickle cell anemia (HCC)    sickle cell disease  . Sickle cell anemia (HCC)   . Thrombocytopenia ([place])   . Vitamin D deficiency     PAST SURGICAL HISTORY :   Past Surgical History:   Procedure Laterality Date  . COLONOSCOPY  2014   [facility]  . GALLBLADDER SURGERY    . PERIPHERAL VASCULAR CATHETERIZATION Left 06/14/2015   Procedure: Dialysis/Perma Catheter Insertion;  Surgeon: Katha Cabal, MD;  Location: [facility];  Service: Cardiovascular;  Laterality: Left;  . TUBAL LIGATION      FAMILY HISTORY :   Family History  Problem Relation Age of Onset  . Heart disease Father   . Diabetes Father   . Diabetes Sister   . Cancer Mother     breast cancer  . Cancer Maternal Aunt     Breast cancer    SOCIAL HISTORY:   Social History  Substance Use Topics  . Smoking status: Former Smoker    Packs/day: 0.25    Years: 30.00    Types: Cigarettes    Quit date: 04/29/2015  . Smokeless tobacco: Never Used     Comment: quit 1 month ago  . Alcohol use No    ALLERGIES:  is allergic to ramipril.  MEDICATIONS:  Current Outpatient Prescriptions  Medication Sig Dispense Refill  . albuterol (PROVENTIL HFA;VENTOLIN HFA) 108 (90 Base) MCG/ACT inhaler Inhale 2 puffs into the lungs every 4 (four) hours as needed for wheezing or shortness of breath. 1 Inhaler 2  . calcitRIOL (ROCALTROL) 0.25 MCG capsule Take 0.25 mcg by mouth daily.    . folic acid (FOLVITE) 1 MG tablet Take 1 mg by  mouth daily.    . furosemide (LASIX) 80 MG tablet Take 1 tablet (80 mg total) by mouth 2 (two) times daily. 30 tablet 0  . hydroxyurea (HYDREA) 500 MG capsule Take 1 capsule (500 mg total) by mouth daily. May take with food to minimize GI side effects. 60 capsule 3  . mometasone-formoterol (DULERA) 200-5 MCG/ACT AERO Inhale 2 puffs into the lungs 2 (two) times daily. 1 Inhaler 12  . OXYGEN Inhale into the lungs.    . sodium bicarbonate 650 MG tablet Take by mouth.    . zaleplon (SONATA) 5 MG capsule Reported on 11/06/2015  3  . amiodarone (PACERONE) 200 MG tablet Take 200 mg by mouth daily.    Marland Kitchen HYDROcodone-acetaminophen (NORCO/VICODIN) 5-325 MG tablet Take 1 tablet by mouth every 8 (eight)  hours as needed for moderate pain. 30 tablet 0   No current facility-administered medications for this visit.    Facility-Administered Medications Ordered in Other Visits  Medication Dose Route Frequency Provider Last Rate Last Dose  . 0.9 %  sodium chloride infusion  250 mL Intravenous Once Cammie Sickle, MD      . 0.9 %  sodium chloride infusion  250 mL Intravenous Once Cammie Sickle, MD      . acetaminophen (TYLENOL) tablet 650 mg  650 mg Oral Once Cammie Sickle, MD      . acetaminophen (TYLENOL) tablet 650 mg  650 mg Oral Once Cammie Sickle, MD      . diphenhydrAMINE (BENADRYL) capsule 25 mg  25 mg Oral Once Cammie Sickle, MD      . diphenhydrAMINE (BENADRYL) capsule 25 mg  25 mg Oral Once Cammie Sickle, MD      . furosemide (LASIX) injection 20 mg  20 mg Intravenous Once Cammie Sickle, MD      . furosemide (LASIX) injection 20 mg  20 mg Intravenous Once Cammie Sickle, MD      . heparin lock flush 100 unit/mL  250 Units Intracatheter PRN Leia Alf, MD      . heparin lock flush 100 unit/mL  500 Units Intracatheter Daily PRN Cammie Sickle, MD      . heparin lock flush 100 unit/mL  250 Units Intracatheter PRN Cammie Sickle, MD      . heparin lock flush 100 unit/mL  500 Units Intracatheter Daily PRN Cammie Sickle, MD      . heparin lock flush 100 unit/mL  250 Units Intracatheter PRN Cammie Sickle, MD      . sodium chloride flush (NS) 0.9 % injection 10 mL  10 mL Intracatheter PRN Cammie Sickle, MD      . sodium chloride flush (NS) 0.9 % injection 10 mL  10 mL Intracatheter PRN Cammie Sickle, MD      . sodium chloride flush (NS) 0.9 % injection 3 mL  3 mL Intracatheter PRN Cammie Sickle, MD      . sodium chloride flush (NS) 0.9 % injection 3 mL  3 mL Intracatheter PRN Cammie Sickle, MD        PHYSICAL EXAMINATION:   BP 125/61 (BP Location: Left Arm, Patient Position:  Sitting)   Pulse 64   Temp 98.4 F (36.9 C) (Tympanic)   Resp 18   Wt 195 lb 8 oz (88.7 kg)   BMI 30.62 kg/m   Filed Weights   01/17/16 0955  Weight: 195 lb 8 oz (88.7 kg)  GENERAL: Well-nourished well-developed; Alert, no distress and comfortable.  With her husband.She is walking by herself. Accompanied by her husband. She is on nasal Oxygen. EYES: positive for pallor/no icterus.  OROPHARYNX: no thrush or ulceration; good dentition  NECK: supple, no masses felt LYMPH:  no palpable lymphadenopathy in the cervical, axillary or inguinal regions LUNGS: decreased breath sounds to auscultation and  No wheeze or crackles HEART/CVS: regular rate & rhythm and no murmurs; No positive lower extremity edema ABDOMEN:abdomen soft, non-tender and normal bowel sounds Musculoskeletal:no cyanosis of digits and no clubbing; PSYCH: alert & oriented x 3 with fluent speech NEURO: no focal motor/sensory deficits SKIN: lichen planus lesions on the Legs bil [>15 years per pt]  LABORATORY DATA:  I have reviewed the data as listed    Component Value Date/Time   NA 135 01/17/2016 0926   NA 138 06/20/2012 1400   K 4.3 01/17/2016 0926   K 4.3 06/20/2012 1400   CL 101 01/17/2016 0926   CL 105 06/20/2012 1400   CO2 24 01/17/2016 0926   CO2 24 06/20/2012 1400   GLUCOSE 130 (H) 01/17/2016 0926   GLUCOSE 102 (H) 06/20/2012 1400   BUN 81 (H) 01/17/2016 0926   BUN 13 06/20/2012 1400   CREATININE 3.60 (H) 01/17/2016 0926   CREATININE 1.36 (H) 03/22/2013 1522   CALCIUM 8.9 01/17/2016 0926   CALCIUM 8.4 (L) 12/23/2015 1039   PROT 6.7 01/17/2016 0926   PROT 7.1 03/22/2013 1522   ALBUMIN 3.4 (L) 01/17/2016 0926   ALBUMIN 3.7 03/22/2013 1522   AST 40 01/17/2016 0926   AST 48 (H) 03/22/2013 1522   ALT 17 01/17/2016 0926   ALT 25 03/22/2013 1522   ALKPHOS 122 01/17/2016 0926   ALKPHOS 139 (H) 03/22/2013 1522   BILITOT 12.4 (H) 01/17/2016 0926   BILITOT 4.0 (H) 03/22/2013 1522   GFRNONAA 12 (L)  01/17/2016 0926   GFRNONAA 42 (L) 03/22/2013 1522   GFRAA 14 (L) 01/17/2016 0926   GFRAA 48 (L) 03/22/2013 1522    No results found for: SPEP, UPEP  Lab Results  Component Value Date   WBC 12.2 (H) 01/17/2016   NEUTROABS 10.1 (H) 01/17/2016   HGB 5.9 (L) 01/17/2016   HCT 16.7 (L) 01/17/2016   MCV 107.7 (H) 01/17/2016   PLT 186 01/17/2016      Chemistry      Component Value Date/Time   NA 135 01/17/2016 0926   NA 138 06/20/2012 1400   K 4.3 01/17/2016 0926   K 4.3 06/20/2012 1400   CL 101 01/17/2016 0926   CL 105 06/20/2012 1400   CO2 24 01/17/2016 0926   CO2 24 06/20/2012 1400   BUN 81 (H) 01/17/2016 0926   BUN 13 06/20/2012 1400   CREATININE 3.60 (H) 01/17/2016 0926   CREATININE 1.36 (H) 03/22/2013 1522   GLU 89 02/20/2010      Component Value Date/Time   CALCIUM 8.9 01/17/2016 0926   CALCIUM 8.4 (L) 12/23/2015 1039   ALKPHOS 122 01/17/2016 0926   ALKPHOS 139 (H) 03/22/2013 1522   AST 40 01/17/2016 0926   AST 48 (H) 03/22/2013 1522   ALT 17 01/17/2016 0926   ALT 25 03/22/2013 1522   BILITOT 12.4 (H) 01/17/2016 0926   BILITOT 4.0 (H) 03/22/2013 1522         ASSESSMENT & PLAN:   Anemia of chronic renal failure, stage 4 (severe) (HCC) # SEVERE ANEMIA-Multifactorial chronic kidney disease/iron deficiency/sickle cell disease. Brisk bone marrow response  #  Severe anemia hemoglobin today 5.9; not responding well;  Increase to  Procrit 40,000 units Monday Wednesday Friday. Plan Starting IV Ferrahem every 2 weeks- patient has good response to IV iron. Ferritin not a good indicator.  # Sickle cell disease- confirmed on hemoglobin electrophoresis. On folic acid once a day. On  Hydrea 500 mg once a day.  #  Chronic kidney disease/ acute renal failure- today creatinine is 3.5 [planning HD/fistula]- not on HD yet.   # COPD/OSA/CHF [Dr.Kasa] on Home O2 -2L [place]; CPAP.   # Right hip pain- check X-ray. Try hydrocodone every 8 hours.   #   Patient will follow-up with  me in approximately  3 months  Increase procrit to three times a week;  IV iron every 2 weeks.   # Port placement- refer to Dr.Dew.     Cammie Sickle, MD 01/17/2016 11:48 AM

## 2016-01-20 ENCOUNTER — Inpatient Hospital Stay: Payer: BLUE CROSS/BLUE SHIELD

## 2016-01-20 ENCOUNTER — Ambulatory Visit: Payer: BLUE CROSS/BLUE SHIELD

## 2016-01-20 ENCOUNTER — Other Ambulatory Visit: Payer: Self-pay | Admitting: *Deleted

## 2016-01-20 ENCOUNTER — Other Ambulatory Visit: Payer: Self-pay | Admitting: Internal Medicine

## 2016-01-20 ENCOUNTER — Inpatient Hospital Stay: Payer: BLUE CROSS/BLUE SHIELD | Admitting: *Deleted

## 2016-01-20 VITALS — BP 128/61 | HR 54 | Temp 97.0°F | Resp 18

## 2016-01-20 DIAGNOSIS — D571 Sickle-cell disease without crisis: Secondary | ICD-10-CM

## 2016-01-20 DIAGNOSIS — N189 Chronic kidney disease, unspecified: Principal | ICD-10-CM

## 2016-01-20 DIAGNOSIS — D631 Anemia in chronic kidney disease: Secondary | ICD-10-CM

## 2016-01-20 DIAGNOSIS — I129 Hypertensive chronic kidney disease with stage 1 through stage 4 chronic kidney disease, or unspecified chronic kidney disease: Secondary | ICD-10-CM | POA: Diagnosis not present

## 2016-01-20 DIAGNOSIS — N183 Chronic kidney disease, stage 3 unspecified: Secondary | ICD-10-CM

## 2016-01-20 DIAGNOSIS — N184 Chronic kidney disease, stage 4 (severe): Secondary | ICD-10-CM

## 2016-01-20 LAB — SAMPLE TO BLOOD BANK

## 2016-01-20 LAB — HEMOGLOBIN AND HEMATOCRIT, BLOOD
HEMATOCRIT: 16 % — AB (ref 35.0–47.0)
Hemoglobin: 5.6 g/dL — ABNORMAL LOW (ref 12.0–16.0)

## 2016-01-20 LAB — PREPARE RBC (CROSSMATCH)

## 2016-01-20 MED ORDER — EPOETIN ALFA 40000 UNIT/ML IJ SOLN
40000.0000 [IU] | Freq: Once | INTRAMUSCULAR | Status: AC
  Administered 2016-01-20: 40000 [IU] via SUBCUTANEOUS
  Filled 2016-01-20: qty 1

## 2016-01-20 NOTE — Progress Notes (Signed)
BLOOD BANK ORDERS-RELEASED. BLOOD BANK NOTIFIED.

## 2016-01-21 ENCOUNTER — Inpatient Hospital Stay: Payer: BLUE CROSS/BLUE SHIELD

## 2016-01-21 ENCOUNTER — Other Ambulatory Visit: Payer: Self-pay | Admitting: Vascular Surgery

## 2016-01-21 DIAGNOSIS — I129 Hypertensive chronic kidney disease with stage 1 through stage 4 chronic kidney disease, or unspecified chronic kidney disease: Secondary | ICD-10-CM | POA: Diagnosis not present

## 2016-01-21 DIAGNOSIS — N184 Chronic kidney disease, stage 4 (severe): Secondary | ICD-10-CM

## 2016-01-21 DIAGNOSIS — D571 Sickle-cell disease without crisis: Secondary | ICD-10-CM

## 2016-01-21 DIAGNOSIS — D631 Anemia in chronic kidney disease: Secondary | ICD-10-CM

## 2016-01-21 MED ORDER — DIPHENHYDRAMINE HCL 25 MG PO CAPS
25.0000 mg | ORAL_CAPSULE | Freq: Once | ORAL | Status: AC
  Administered 2016-01-21: 25 mg via ORAL
  Filled 2016-01-21: qty 1

## 2016-01-21 MED ORDER — SODIUM CHLORIDE 0.9 % IV SOLN
250.0000 mL | Freq: Once | INTRAVENOUS | Status: AC
  Administered 2016-01-21: 250 mL via INTRAVENOUS
  Filled 2016-01-21: qty 250

## 2016-01-21 MED ORDER — ACETAMINOPHEN 325 MG PO TABS
650.0000 mg | ORAL_TABLET | Freq: Once | ORAL | Status: AC
  Administered 2016-01-21: 650 mg via ORAL
  Filled 2016-01-21: qty 2

## 2016-01-21 MED ORDER — FUROSEMIDE 10 MG/ML IJ SOLN
20.0000 mg | Freq: Once | INTRAMUSCULAR | Status: AC
  Administered 2016-01-21: 20 mg via INTRAVENOUS
  Filled 2016-01-21: qty 2

## 2016-01-22 ENCOUNTER — Inpatient Hospital Stay: Payer: BLUE CROSS/BLUE SHIELD

## 2016-01-22 ENCOUNTER — Inpatient Hospital Stay: Payer: BLUE CROSS/BLUE SHIELD | Admitting: *Deleted

## 2016-01-22 ENCOUNTER — Telehealth: Payer: Self-pay | Admitting: *Deleted

## 2016-01-22 VITALS — BP 123/58 | HR 60

## 2016-01-22 DIAGNOSIS — G4733 Obstructive sleep apnea (adult) (pediatric): Secondary | ICD-10-CM

## 2016-01-22 DIAGNOSIS — D631 Anemia in chronic kidney disease: Secondary | ICD-10-CM

## 2016-01-22 DIAGNOSIS — N189 Chronic kidney disease, unspecified: Principal | ICD-10-CM

## 2016-01-22 DIAGNOSIS — N184 Chronic kidney disease, stage 4 (severe): Secondary | ICD-10-CM

## 2016-01-22 DIAGNOSIS — I129 Hypertensive chronic kidney disease with stage 1 through stage 4 chronic kidney disease, or unspecified chronic kidney disease: Secondary | ICD-10-CM | POA: Diagnosis not present

## 2016-01-22 DIAGNOSIS — N183 Chronic kidney disease, stage 3 unspecified: Secondary | ICD-10-CM

## 2016-01-22 LAB — TYPE AND SCREEN
ABO/RH(D): O POS
Antibody Screen: NEGATIVE
UNIT DIVISION: 0
UNIT DIVISION: 0

## 2016-01-22 LAB — HEMOGLOBIN: Hemoglobin: 7.9 g/dL — ABNORMAL LOW (ref 12.0–16.0)

## 2016-01-22 MED ORDER — EPOETIN ALFA 40000 UNIT/ML IJ SOLN
40000.0000 [IU] | Freq: Once | INTRAMUSCULAR | Status: AC
  Administered 2016-01-22: 40000 [IU] via SUBCUTANEOUS
  Filled 2016-01-22: qty 1

## 2016-01-22 NOTE — Telephone Encounter (Signed)
Pt  Informed of sleep study results. Order placed for BiPAP. Nothing further needed.

## 2016-01-23 ENCOUNTER — Inpatient Hospital Stay: Payer: BLUE CROSS/BLUE SHIELD

## 2016-01-23 ENCOUNTER — Ambulatory Visit
Admission: RE | Admit: 2016-01-23 | Discharge: 2016-01-23 | Disposition: A | Discharge status: Home / Self Care | Payer: BLUE CROSS/BLUE SHIELD | Source: Ambulatory Visit | Attending: Vascular Surgery | Admitting: Vascular Surgery

## 2016-01-23 ENCOUNTER — Encounter
Admission: RE | Disposition: A | Discharge status: Home / Self Care | Payer: Self-pay | Source: Ambulatory Visit | Attending: Vascular Surgery

## 2016-01-23 VITALS — BP 125/65 | HR 56 | Resp 18

## 2016-01-23 DIAGNOSIS — L439 Lichen planus, unspecified: Secondary | ICD-10-CM | POA: Diagnosis not present

## 2016-01-23 DIAGNOSIS — Z9981 Dependence on supplemental oxygen: Secondary | ICD-10-CM | POA: Insufficient documentation

## 2016-01-23 DIAGNOSIS — D696 Thrombocytopenia, unspecified: Secondary | ICD-10-CM | POA: Diagnosis not present

## 2016-01-23 DIAGNOSIS — D151 Benign neoplasm of heart: Secondary | ICD-10-CM | POA: Insufficient documentation

## 2016-01-23 DIAGNOSIS — I509 Heart failure, unspecified: Secondary | ICD-10-CM | POA: Insufficient documentation

## 2016-01-23 DIAGNOSIS — Z8249 Family history of ischemic heart disease and other diseases of the circulatory system: Secondary | ICD-10-CM | POA: Diagnosis not present

## 2016-01-23 DIAGNOSIS — Z87891 Personal history of nicotine dependence: Secondary | ICD-10-CM | POA: Diagnosis not present

## 2016-01-23 DIAGNOSIS — D571 Sickle-cell disease without crisis: Secondary | ICD-10-CM | POA: Diagnosis present

## 2016-01-23 DIAGNOSIS — Z95828 Presence of other vascular implants and grafts: Secondary | ICD-10-CM | POA: Insufficient documentation

## 2016-01-23 DIAGNOSIS — Z888 Allergy status to other drugs, medicaments and biological substances status: Secondary | ICD-10-CM | POA: Insufficient documentation

## 2016-01-23 DIAGNOSIS — N189 Chronic kidney disease, unspecified: Secondary | ICD-10-CM | POA: Diagnosis not present

## 2016-01-23 DIAGNOSIS — J449 Chronic obstructive pulmonary disease, unspecified: Secondary | ICD-10-CM | POA: Insufficient documentation

## 2016-01-23 DIAGNOSIS — E559 Vitamin D deficiency, unspecified: Secondary | ICD-10-CM | POA: Insufficient documentation

## 2016-01-23 DIAGNOSIS — Z803 Family history of malignant neoplasm of breast: Secondary | ICD-10-CM | POA: Diagnosis not present

## 2016-01-23 DIAGNOSIS — G118 Other hereditary ataxias: Secondary | ICD-10-CM | POA: Insufficient documentation

## 2016-01-23 DIAGNOSIS — M109 Gout, unspecified: Secondary | ICD-10-CM | POA: Insufficient documentation

## 2016-01-23 DIAGNOSIS — D631 Anemia in chronic kidney disease: Secondary | ICD-10-CM

## 2016-01-23 DIAGNOSIS — Z833 Family history of diabetes mellitus: Secondary | ICD-10-CM | POA: Insufficient documentation

## 2016-01-23 DIAGNOSIS — M810 Age-related osteoporosis without current pathological fracture: Secondary | ICD-10-CM | POA: Insufficient documentation

## 2016-01-23 DIAGNOSIS — Z9851 Tubal ligation status: Secondary | ICD-10-CM | POA: Diagnosis not present

## 2016-01-23 DIAGNOSIS — I130 Hypertensive heart and chronic kidney disease with heart failure and stage 1 through stage 4 chronic kidney disease, or unspecified chronic kidney disease: Secondary | ICD-10-CM | POA: Diagnosis not present

## 2016-01-23 DIAGNOSIS — N184 Chronic kidney disease, stage 4 (severe): Secondary | ICD-10-CM

## 2016-01-23 DIAGNOSIS — I129 Hypertensive chronic kidney disease with stage 1 through stage 4 chronic kidney disease, or unspecified chronic kidney disease: Secondary | ICD-10-CM | POA: Diagnosis not present

## 2016-01-23 HISTORY — PX: PERIPHERAL VASCULAR CATHETERIZATION: SHX172C

## 2016-01-23 SURGERY — PORTA CATH INSERTION
Anesthesia: Moderate Sedation

## 2016-01-23 MED ORDER — SODIUM CHLORIDE 0.9% FLUSH: 3.0000 mL | INTRAVENOUS | Status: DC | PRN

## 2016-01-23 MED ORDER — SODIUM CHLORIDE 0.9% FLUSH: 10.0000 mL | INTRAVENOUS | Status: DC | PRN

## 2016-01-23 MED ORDER — FENTANYL CITRATE (PF) 100 MCG/2ML IJ SOLN
INTRAMUSCULAR | Status: AC
  Filled 2016-01-23: qty 2

## 2016-01-23 MED ORDER — ONDANSETRON HCL 4 MG/2ML IJ SOLN: 4.0000 mg | Freq: Four times a day (QID) | INTRAMUSCULAR | Status: DC | PRN

## 2016-01-23 MED ORDER — GENTAMICIN SULFATE 40 MG/ML IJ SOLN
Freq: Once | INTRAMUSCULAR | Status: DC
  Filled 2016-01-23 (×2): qty 2

## 2016-01-23 MED ORDER — DEXTROSE 5 % IV SOLN: 1.5000 g | INTRAVENOUS | Status: DC

## 2016-01-23 MED ORDER — HEPARIN SOD (PORK) LOCK FLUSH 100 UNIT/ML IV SOLN: 250.0000 [IU] | INTRAVENOUS | Status: DC | PRN

## 2016-01-23 MED ORDER — SODIUM CHLORIDE 0.9 % IV SOLN
INTRAVENOUS | Status: DC
  Administered 2016-01-23: 14:00:00 via INTRAVENOUS
  Filled 2016-01-23: qty 1000

## 2016-01-23 MED ORDER — MIDAZOLAM HCL 2 MG/2ML IJ SOLN
INTRAMUSCULAR | Status: DC | PRN
  Administered 2016-01-23: 1 mg via INTRAVENOUS
  Administered 2016-01-23: 2 mg via INTRAVENOUS

## 2016-01-23 MED ORDER — LIDOCAINE-EPINEPHRINE (PF) 1 %-1:200000 IJ SOLN
INTRAMUSCULAR | Status: AC
  Filled 2016-01-23: qty 30

## 2016-01-23 MED ORDER — HEPARIN SOD (PORK) LOCK FLUSH 100 UNIT/ML IV SOLN: 500.0000 [IU] | Freq: Every day | INTRAVENOUS | Status: DC | PRN

## 2016-01-23 MED ORDER — HEPARIN SOD (PORK) LOCK FLUSH 100 UNIT/ML IV SOLN
500.0000 [IU] | Freq: Once | INTRAVENOUS | Status: AC
  Administered 2016-01-23: 500 [IU] via INTRAVENOUS
  Filled 2016-01-23: qty 5

## 2016-01-23 MED ORDER — HYDROMORPHONE HCL 1 MG/ML IJ SOLN: 1.0000 mg | Freq: Once | INTRAMUSCULAR | Status: DC

## 2016-01-23 MED ORDER — SODIUM CHLORIDE 0.9 % IV SOLN
INTRAVENOUS | Status: DC
  Administered 2016-01-23: 08:00:00 via INTRAVENOUS

## 2016-01-23 MED ORDER — HEPARIN (PORCINE) IN NACL 2-0.9 UNIT/ML-% IJ SOLN
INTRAMUSCULAR | Status: AC
  Filled 2016-01-23: qty 500

## 2016-01-23 MED ORDER — MIDAZOLAM HCL 5 MG/5ML IJ SOLN
INTRAMUSCULAR | Status: AC
  Filled 2016-01-23: qty 5

## 2016-01-23 MED ORDER — SODIUM CHLORIDE 0.9 % IV SOLN
510.0000 mg | Freq: Once | INTRAVENOUS | Status: AC
  Administered 2016-01-23: 510 mg via INTRAVENOUS
  Filled 2016-01-23: qty 17

## 2016-01-23 MED ORDER — FENTANYL CITRATE (PF) 100 MCG/2ML IJ SOLN
INTRAMUSCULAR | Status: DC | PRN
  Administered 2016-01-23: 25 ug via INTRAVENOUS
  Administered 2016-01-23: 50 ug via INTRAVENOUS

## 2016-01-23 SURGICAL SUPPLY — 10 items
BAG DECANTER STRL (MISCELLANEOUS) ×2 IMPLANT
KIT PORT POWER 8FR ISP CVUE (Catheter) ×2 IMPLANT
PACK ANGIOGRAPHY (CUSTOM PROCEDURE TRAY) ×2 IMPLANT
PAD GROUND ADULT SPLIT (MISCELLANEOUS) ×2 IMPLANT
PENCIL ELECTRO HAND CTR (MISCELLANEOUS) ×2 IMPLANT
PREP CHG 10.5 TEAL (MISCELLANEOUS) ×2 IMPLANT
SUT MNCRL AB 4-0 PS2 18 (SUTURE) ×2 IMPLANT
SUT PROLENE 0 CT 1 30 (SUTURE) ×2 IMPLANT
SUTURE VIC 3-0 (SUTURE) ×2 IMPLANT
TOWEL OR 17X26 4PK STRL BLUE (TOWEL DISPOSABLE) ×2 IMPLANT

## 2016-01-23 NOTE — Discharge Instructions (Signed)
Port is ready to use today. No heavy lifting with left arm for next week. Return to ER for any severe pain at the surgical site, SOB or temperature greater than 100.5.

## 2016-01-23 NOTE — Op Note (Signed)
      [facility]       Operative Note  Date: 01/23/2016  Preoperative diagnosis:  1. Sickle cell disease  Postoperative diagnosis:  Same as above  Procedures: #1. Ultrasound guidance for vascular access to the left internal jugular vein. #2. Fluoroscopic guidance for placement of catheter. #3. Placement of CT compatible Port-A-Cath, left internal jugular vein.  Surgeon: Leotis Pain, MD.   Anesthesia: Local with moderate conscious sedation for approximately 25  minutes using 3 mg of Versed and 75 mcg of Fentanyl  Fluoroscopy time: less than 1 minute  Contrast used: 0  Estimated blood loss: 15 cc  Indication for the procedure:  The patient is a 65 y.o.female with sickle cell disease, an atrial myxoma, chronic kidney disease and multiple other medical issues..  The patient needs a Port-A-Cath for durable venous access, lab draws, and frequent infusions. We are asked to place this. Risks and benefits were discussed and informed consent was obtained.  Description of procedure: The patient was brought to the vascular and interventional radiology suite.  Moderate conscious sedation was administered throughout the procedure during a face to face encounter with the patient with my supervision of the RN administering medicines and monitoring the patient's vital signs, pulse oximetry, telemetry and mental status throughout from the start of the procedure until the patient was taken to the recovery room. The left neck chest and shoulder were sterilely prepped and draped, and a sterile surgical field was created. Ultrasound was used to help visualize a patent left internal jugular vein. This was then accessed under direct ultrasound guidance without difficulty with the Seldinger needle and a permanent image was recorded. A J-wire was placed. After skin nick and dilatation, the peel-away sheath was then placed over the wire. I then anesthetized an area under the clavicle  approximately 1-2 fingerbreadths. A transverse incision was created and an inferior pocket was created with electrocautery and blunt dissection. The port was then brought onto the field, placed into the pocket and secured to the chest wall with 2 Prolene sutures. The catheter was connected to the port and tunneled from the subclavicular incision to the access site. Fluoroscopic guidance was then used to cut the catheter to an appropriate length. The catheter was then placed through the peel-away sheath and the peel-away sheath was removed. The catheter tip was parked in excellent location under fluorocoscopic guidance in the superior vena cava just above the right atrium. The pocket was then irrigated with antibiotic impregnated saline and the wound was closed with a running 3-0 Vicryl and a 4-0 Monocryl. The access incision was closed with a single 4-0 Monocryl. The Huber needle was used to withdraw blood and flush the port with heparinized saline. Dermabond was then placed as a dressing. The patient tolerated the procedure well and was taken to the recovery room in stable condition.   DEW,JASON 01/23/2016 9:44 AM

## 2016-01-23 NOTE — H&P (Signed)
  [facility] History & Physical Update  The patient was interviewed and re-examined.  The patient's previous History and Physical has been reviewed and is unchanged.  There is no change in the plan of care. We plan to proceed with the scheduled procedure.  Emiliano Welshans, MD  01/23/2016, 8:06 AM

## 2016-01-24 ENCOUNTER — Inpatient Hospital Stay: Payer: BLUE CROSS/BLUE SHIELD

## 2016-01-24 ENCOUNTER — Other Ambulatory Visit: Payer: Self-pay

## 2016-01-24 ENCOUNTER — Encounter: Payer: Self-pay | Admitting: Vascular Surgery

## 2016-01-24 ENCOUNTER — Ambulatory Visit: Payer: BLUE CROSS/BLUE SHIELD

## 2016-01-24 ENCOUNTER — Telehealth: Payer: Self-pay | Admitting: Internal Medicine

## 2016-01-24 ENCOUNTER — Other Ambulatory Visit: Payer: Self-pay | Admitting: *Deleted

## 2016-01-24 VITALS — BP 134/66 | HR 64 | Resp 18

## 2016-01-24 DIAGNOSIS — I129 Hypertensive chronic kidney disease with stage 1 through stage 4 chronic kidney disease, or unspecified chronic kidney disease: Secondary | ICD-10-CM | POA: Diagnosis not present

## 2016-01-24 DIAGNOSIS — N189 Chronic kidney disease, unspecified: Principal | ICD-10-CM

## 2016-01-24 DIAGNOSIS — D571 Sickle-cell disease without crisis: Secondary | ICD-10-CM

## 2016-01-24 DIAGNOSIS — D631 Anemia in chronic kidney disease: Secondary | ICD-10-CM

## 2016-01-24 MED ORDER — EPOETIN ALFA 40000 UNIT/ML IJ SOLN
40000.0000 [IU] | Freq: Once | INTRAMUSCULAR | Status: AC
  Administered 2016-01-24: 40000 [IU] via SUBCUTANEOUS
  Filled 2016-01-24: qty 1

## 2016-01-24 NOTE — Progress Notes (Signed)
Received call from Danley Danker, RN, the lab was unsuccessful in getting blood today so Dr. Rogue Bussing approves to go ahead with Procrit today without any labs results.

## 2016-01-24 NOTE — Telephone Encounter (Signed)
CPAP Titration Study faxed to Jiles Crocker at [facility] to [phone]. Nothing else needed at this time. [name]

## 2016-01-24 NOTE — Telephone Encounter (Signed)
Ex [phone] [facility]  They can not locate the CPAP study in epic They need a copy of this   Please fax to  [phone]

## 2016-01-27 ENCOUNTER — Inpatient Hospital Stay: Payer: BLUE CROSS/BLUE SHIELD

## 2016-01-27 VITALS — BP 136/51 | HR 61 | Resp 18

## 2016-01-27 DIAGNOSIS — D631 Anemia in chronic kidney disease: Secondary | ICD-10-CM

## 2016-01-27 DIAGNOSIS — N189 Chronic kidney disease, unspecified: Principal | ICD-10-CM

## 2016-01-27 DIAGNOSIS — I129 Hypertensive chronic kidney disease with stage 1 through stage 4 chronic kidney disease, or unspecified chronic kidney disease: Secondary | ICD-10-CM | POA: Diagnosis not present

## 2016-01-27 DIAGNOSIS — D571 Sickle-cell disease without crisis: Secondary | ICD-10-CM

## 2016-01-27 LAB — SAMPLE TO BLOOD BANK

## 2016-01-27 LAB — HEMATOCRIT: HEMATOCRIT: 18.6 % — AB (ref 35.0–47.0)

## 2016-01-27 LAB — HEMOGLOBIN: Hemoglobin: 6.5 g/dL — ABNORMAL LOW (ref 12.0–16.0)

## 2016-01-27 MED ORDER — EPOETIN ALFA 40000 UNIT/ML IJ SOLN
40000.0000 [IU] | Freq: Once | INTRAMUSCULAR | Status: AC
  Administered 2016-01-27: 40000 [IU] via SUBCUTANEOUS
  Filled 2016-01-27: qty 1

## 2016-01-28 ENCOUNTER — Other Ambulatory Visit: Payer: Self-pay | Admitting: Vascular Surgery

## 2016-01-29 ENCOUNTER — Inpatient Hospital Stay: Payer: BLUE CROSS/BLUE SHIELD

## 2016-01-29 VITALS — BP 110/66 | HR 108 | Resp 18

## 2016-01-29 DIAGNOSIS — N189 Chronic kidney disease, unspecified: Principal | ICD-10-CM

## 2016-01-29 DIAGNOSIS — D631 Anemia in chronic kidney disease: Secondary | ICD-10-CM

## 2016-01-29 DIAGNOSIS — I129 Hypertensive chronic kidney disease with stage 1 through stage 4 chronic kidney disease, or unspecified chronic kidney disease: Secondary | ICD-10-CM | POA: Diagnosis not present

## 2016-01-29 DIAGNOSIS — D571 Sickle-cell disease without crisis: Secondary | ICD-10-CM

## 2016-01-29 LAB — SAMPLE TO BLOOD BANK

## 2016-01-29 LAB — HEMATOCRIT: HCT: 19.1 % — ABNORMAL LOW (ref 35.0–47.0)

## 2016-01-29 LAB — HEMOGLOBIN: HEMOGLOBIN: 6.7 g/dL — AB (ref 12.0–16.0)

## 2016-01-29 MED ORDER — EPOETIN ALFA 40000 UNIT/ML IJ SOLN
40000.0000 [IU] | Freq: Once | INTRAMUSCULAR | Status: AC
  Administered 2016-01-29: 40000 [IU] via SUBCUTANEOUS
  Filled 2016-01-29: qty 1

## 2016-01-31 ENCOUNTER — Inpatient Hospital Stay: Payer: Medicare Other

## 2016-01-31 ENCOUNTER — Inpatient Hospital Stay: Payer: Medicare Other | Attending: Internal Medicine

## 2016-01-31 VITALS — BP 106/63 | HR 64 | Resp 18

## 2016-01-31 DIAGNOSIS — N184 Chronic kidney disease, stage 4 (severe): Secondary | ICD-10-CM | POA: Insufficient documentation

## 2016-01-31 DIAGNOSIS — I129 Hypertensive chronic kidney disease with stage 1 through stage 4 chronic kidney disease, or unspecified chronic kidney disease: Secondary | ICD-10-CM | POA: Insufficient documentation

## 2016-01-31 DIAGNOSIS — D631 Anemia in chronic kidney disease: Secondary | ICD-10-CM | POA: Diagnosis not present

## 2016-01-31 DIAGNOSIS — N189 Chronic kidney disease, unspecified: Principal | ICD-10-CM

## 2016-01-31 DIAGNOSIS — Z79899 Other long term (current) drug therapy: Secondary | ICD-10-CM | POA: Insufficient documentation

## 2016-01-31 DIAGNOSIS — D571 Sickle-cell disease without crisis: Secondary | ICD-10-CM

## 2016-01-31 LAB — HEMATOCRIT: HEMATOCRIT: 17.7 % — AB (ref 35.0–47.0)

## 2016-01-31 LAB — HEMOGLOBIN: HEMOGLOBIN: 6.3 g/dL — AB (ref 12.0–16.0)

## 2016-01-31 LAB — SAMPLE TO BLOOD BANK

## 2016-01-31 MED ORDER — EPOETIN ALFA 40000 UNIT/ML IJ SOLN
40000.0000 [IU] | Freq: Once | INTRAMUSCULAR | Status: AC
  Administered 2016-01-31: 40000 [IU] via SUBCUTANEOUS
  Filled 2016-01-31: qty 1

## 2016-02-05 ENCOUNTER — Inpatient Hospital Stay: Payer: Medicare Other

## 2016-02-05 VITALS — BP 116/58 | HR 61 | Resp 18

## 2016-02-05 DIAGNOSIS — D631 Anemia in chronic kidney disease: Secondary | ICD-10-CM

## 2016-02-05 DIAGNOSIS — N189 Chronic kidney disease, unspecified: Principal | ICD-10-CM

## 2016-02-05 DIAGNOSIS — D571 Sickle-cell disease without crisis: Secondary | ICD-10-CM

## 2016-02-05 DIAGNOSIS — I129 Hypertensive chronic kidney disease with stage 1 through stage 4 chronic kidney disease, or unspecified chronic kidney disease: Secondary | ICD-10-CM | POA: Diagnosis not present

## 2016-02-05 LAB — HEMOGLOBIN: HEMOGLOBIN: 5.3 g/dL — AB (ref 12.0–16.0)

## 2016-02-05 LAB — SAMPLE TO BLOOD BANK

## 2016-02-05 LAB — HEMATOCRIT: HCT: 15 % — ABNORMAL LOW (ref 35.0–47.0)

## 2016-02-05 MED ORDER — EPOETIN ALFA 40000 UNIT/ML IJ SOLN
40000.0000 [IU] | Freq: Once | INTRAMUSCULAR | Status: AC
  Administered 2016-02-05: 40000 [IU] via SUBCUTANEOUS
  Filled 2016-02-05: qty 1

## 2016-02-05 NOTE — Progress Notes (Signed)
Patient reports symptoms at baseline despite hgb of 5.3 today and states she does not feel like she is in need of a blood transfusion.  Verbal results given to Dr. Rogue Bussing and he would like patient to keep her feraheme appt for tomorrow as well as the Procrit appt for Friday.  No blood transfusion needed at this time since patient does not offer worsening symptoms.    Patient aware and agrees to keep upcoming appts.

## 2016-02-05 NOTE — Progress Notes (Signed)
Spoke with Dr. Rogue Bussing. hgb 5.3 today. Pt asymptomatic. Per Dr. Eber Jones with procrit today and Friday. And IV iron on Thursday. No IV blood transfusion today.

## 2016-02-06 ENCOUNTER — Inpatient Hospital Stay: Payer: Medicare Other

## 2016-02-06 VITALS — BP 118/60 | HR 64 | Resp 18

## 2016-02-06 DIAGNOSIS — N189 Chronic kidney disease, unspecified: Principal | ICD-10-CM

## 2016-02-06 DIAGNOSIS — I129 Hypertensive chronic kidney disease with stage 1 through stage 4 chronic kidney disease, or unspecified chronic kidney disease: Secondary | ICD-10-CM | POA: Diagnosis not present

## 2016-02-06 DIAGNOSIS — D631 Anemia in chronic kidney disease: Secondary | ICD-10-CM

## 2016-02-06 MED ORDER — HEPARIN SOD (PORK) LOCK FLUSH 100 UNIT/ML IV SOLN
INTRAVENOUS | Status: AC
  Filled 2016-02-06: qty 5

## 2016-02-06 MED ORDER — SODIUM CHLORIDE 0.9 % IJ SOLN
10.0000 mL | Freq: Once | INTRAMUSCULAR | Status: AC
  Administered 2016-02-06: 10 mL via INTRAVENOUS
  Filled 2016-02-06: qty 10

## 2016-02-06 MED ORDER — SODIUM CHLORIDE 0.9 % IV SOLN
510.0000 mg | Freq: Once | INTRAVENOUS | Status: AC
  Administered 2016-02-06: 510 mg via INTRAVENOUS
  Filled 2016-02-06: qty 17

## 2016-02-06 MED ORDER — SODIUM CHLORIDE 0.9 % IV SOLN
INTRAVENOUS | Status: DC
  Administered 2016-02-06: 14:00:00 via INTRAVENOUS
  Filled 2016-02-06: qty 1000

## 2016-02-06 MED ORDER — HEPARIN SOD (PORK) LOCK FLUSH 100 UNIT/ML IV SOLN
500.0000 [IU] | Freq: Once | INTRAVENOUS | Status: AC
  Administered 2016-02-06: 500 [IU] via INTRAVENOUS

## 2016-02-07 ENCOUNTER — Ambulatory Visit: Payer: BLUE CROSS/BLUE SHIELD

## 2016-02-07 ENCOUNTER — Inpatient Hospital Stay: Payer: Medicare Other

## 2016-02-07 ENCOUNTER — Other Ambulatory Visit: Payer: Self-pay | Admitting: Internal Medicine

## 2016-02-07 ENCOUNTER — Other Ambulatory Visit: Payer: Self-pay | Admitting: *Deleted

## 2016-02-07 ENCOUNTER — Observation Stay
Admission: AD | Admit: 2016-02-07 | Payer: BLUE CROSS/BLUE SHIELD | Source: Ambulatory Visit | Admitting: Internal Medicine

## 2016-02-07 ENCOUNTER — Ambulatory Visit
Admission: RE | Admit: 2016-02-07 | Discharge: 2016-02-07 | Disposition: A | Discharge status: Home / Self Care | Payer: Medicare Other | Source: Ambulatory Visit | Attending: Internal Medicine | Admitting: Internal Medicine

## 2016-02-07 VITALS — BP 120/60 | HR 56 | Resp 18

## 2016-02-07 DIAGNOSIS — D649 Anemia, unspecified: Secondary | ICD-10-CM | POA: Diagnosis present

## 2016-02-07 DIAGNOSIS — N189 Chronic kidney disease, unspecified: Secondary | ICD-10-CM

## 2016-02-07 DIAGNOSIS — N184 Chronic kidney disease, stage 4 (severe): Principal | ICD-10-CM

## 2016-02-07 DIAGNOSIS — D631 Anemia in chronic kidney disease: Secondary | ICD-10-CM

## 2016-02-07 DIAGNOSIS — D571 Sickle-cell disease without crisis: Secondary | ICD-10-CM

## 2016-02-07 LAB — HEMOGLOBIN: HEMOGLOBIN: 5.3 g/dL — AB (ref 12.0–16.0)

## 2016-02-07 LAB — PREPARE RBC (CROSSMATCH)

## 2016-02-07 LAB — HEMATOCRIT: HCT: 15.4 % — ABNORMAL LOW (ref 35.0–47.0)

## 2016-02-07 MED ORDER — FUROSEMIDE 10 MG/ML IJ SOLN
20.0000 mg | Freq: Once | INTRAMUSCULAR | Status: AC
  Administered 2016-02-07: 20 mg via INTRAVENOUS

## 2016-02-07 MED ORDER — SODIUM CHLORIDE 0.9% FLUSH: 3.0000 mL | INTRAVENOUS | Status: DC | PRN

## 2016-02-07 MED ORDER — ACETAMINOPHEN 325 MG PO TABS
650.0000 mg | ORAL_TABLET | Freq: Once | ORAL | Status: AC
  Administered 2016-02-07: 650 mg via ORAL

## 2016-02-07 MED ORDER — SODIUM CHLORIDE 0.9% FLUSH: 10.0000 mL | INTRAVENOUS | Status: DC | PRN

## 2016-02-07 MED ORDER — ACETAMINOPHEN 325 MG PO TABS
ORAL_TABLET | ORAL | Status: AC
  Administered 2016-02-07: 650 mg via ORAL
  Filled 2016-02-07: qty 2

## 2016-02-07 MED ORDER — HEPARIN SOD (PORK) LOCK FLUSH 100 UNIT/ML IV SOLN
500.0000 [IU] | Freq: Every day | INTRAVENOUS | Status: AC | PRN
  Administered 2016-02-07: 500 [IU]

## 2016-02-07 MED ORDER — HEPARIN SOD (PORK) LOCK FLUSH 100 UNIT/ML IV SOLN
INTRAVENOUS | Status: AC
  Administered 2016-02-07: 500 [IU]
  Filled 2016-02-07: qty 5

## 2016-02-07 MED ORDER — SODIUM CHLORIDE 0.9 % IV SOLN: 250.0000 mL | Freq: Once | INTRAVENOUS | Status: DC

## 2016-02-07 MED ORDER — DIPHENHYDRAMINE HCL 25 MG PO CAPS
ORAL_CAPSULE | ORAL | Status: AC
  Administered 2016-02-07: 25 mg via ORAL
  Filled 2016-02-07: qty 1

## 2016-02-07 MED ORDER — SODIUM CHLORIDE FLUSH 0.9 % IV SOLN
INTRAVENOUS | Status: AC
  Filled 2016-02-07: qty 10

## 2016-02-07 MED ORDER — SODIUM CHLORIDE 0.9 % IJ SOLN
10.0000 mL | Freq: Once | INTRAMUSCULAR | Status: AC
  Administered 2016-02-07: 10 mL via INTRAVENOUS
  Filled 2016-02-07: qty 10

## 2016-02-07 MED ORDER — FUROSEMIDE 10 MG/ML IJ SOLN
INTRAMUSCULAR | Status: AC
  Administered 2016-02-07: 20 mg via INTRAVENOUS
  Filled 2016-02-07: qty 2

## 2016-02-07 MED ORDER — HEPARIN SOD (PORK) LOCK FLUSH 100 UNIT/ML IV SOLN: 500.0000 [IU] | Freq: Once | INTRAVENOUS | Status: DC

## 2016-02-07 MED ORDER — EPOETIN ALFA 40000 UNIT/ML IJ SOLN
40000.0000 [IU] | Freq: Once | INTRAMUSCULAR | Status: AC
  Administered 2016-02-07: 40000 [IU] via SUBCUTANEOUS
  Filled 2016-02-07: qty 1

## 2016-02-07 MED ORDER — HEPARIN SOD (PORK) LOCK FLUSH 100 UNIT/ML IV SOLN: 250.0000 [IU] | INTRAVENOUS | Status: DC | PRN

## 2016-02-07 MED ORDER — DIPHENHYDRAMINE HCL 25 MG PO CAPS
25.0000 mg | ORAL_CAPSULE | Freq: Once | ORAL | Status: AC
  Administered 2016-02-07: 25 mg via ORAL

## 2016-02-07 NOTE — Progress Notes (Signed)
Patient Jane Short via wheelchair, patient tired, Out of breath and states she is normally on oxygen At home.  Room air saturation 83 on room air.  Patient Placed on oxygen at 3 liters/minute.  Patient denies Any pain , has some shob.

## 2016-02-07 NOTE — Progress Notes (Signed)
While bringing patient to infusion she stated that she is feeling worse and is requesting a blood transfusion today.  Dr. Rogue Bussing informed and is agreeable to patient receiving blood transfusion today.  His nurse is working on patient receiving 2 units of blood as a phase III admission.    Verbal order from Dr. B to give Procrit injection today as well.

## 2016-02-09 LAB — TYPE AND SCREEN
ABO/RH(D): O POS
ANTIBODY SCREEN: NEGATIVE
UNIT DIVISION: 0
Unit division: 0

## 2016-02-10 ENCOUNTER — Inpatient Hospital Stay: Payer: Medicare Other

## 2016-02-10 VITALS — BP 130/66 | HR 47 | Resp 18

## 2016-02-10 DIAGNOSIS — N189 Chronic kidney disease, unspecified: Principal | ICD-10-CM

## 2016-02-10 DIAGNOSIS — D631 Anemia in chronic kidney disease: Secondary | ICD-10-CM

## 2016-02-10 DIAGNOSIS — D571 Sickle-cell disease without crisis: Secondary | ICD-10-CM

## 2016-02-10 DIAGNOSIS — I129 Hypertensive chronic kidney disease with stage 1 through stage 4 chronic kidney disease, or unspecified chronic kidney disease: Secondary | ICD-10-CM | POA: Diagnosis not present

## 2016-02-10 LAB — HEMATOCRIT: HEMATOCRIT: 23 % — AB (ref 35.0–47.0)

## 2016-02-10 LAB — SAMPLE TO BLOOD BANK

## 2016-02-10 LAB — HEMOGLOBIN: Hemoglobin: 8.2 g/dL — ABNORMAL LOW (ref 12.0–16.0)

## 2016-02-10 MED ORDER — EPOETIN ALFA 40000 UNIT/ML IJ SOLN
40000.0000 [IU] | Freq: Once | INTRAMUSCULAR | Status: AC
  Administered 2016-02-10: 40000 [IU] via SUBCUTANEOUS
  Filled 2016-02-10: qty 1

## 2016-02-11 ENCOUNTER — Other Ambulatory Visit: Payer: BLUE CROSS/BLUE SHIELD

## 2016-02-12 ENCOUNTER — Inpatient Hospital Stay: Payer: Medicare Other

## 2016-02-14 ENCOUNTER — Inpatient Hospital Stay: Payer: Medicare Other

## 2016-02-14 VITALS — BP 115/55 | HR 60 | Resp 18

## 2016-02-14 DIAGNOSIS — D631 Anemia in chronic kidney disease: Secondary | ICD-10-CM

## 2016-02-14 DIAGNOSIS — D571 Sickle-cell disease without crisis: Secondary | ICD-10-CM

## 2016-02-14 DIAGNOSIS — N189 Chronic kidney disease, unspecified: Principal | ICD-10-CM

## 2016-02-14 DIAGNOSIS — I129 Hypertensive chronic kidney disease with stage 1 through stage 4 chronic kidney disease, or unspecified chronic kidney disease: Secondary | ICD-10-CM | POA: Diagnosis not present

## 2016-02-14 LAB — HEMOGLOBIN: Hemoglobin: 7.6 g/dL — ABNORMAL LOW (ref 12.0–16.0)

## 2016-02-14 LAB — HEMATOCRIT: HEMATOCRIT: 21.3 % — AB (ref 35.0–47.0)

## 2016-02-14 LAB — SAMPLE TO BLOOD BANK

## 2016-02-14 MED ORDER — EPOETIN ALFA 40000 UNIT/ML IJ SOLN
40000.0000 [IU] | Freq: Once | INTRAMUSCULAR | Status: AC
  Administered 2016-02-14: 40000 [IU] via SUBCUTANEOUS
  Filled 2016-02-14: qty 1

## 2016-02-17 ENCOUNTER — Inpatient Hospital Stay: Payer: Medicare Other

## 2016-02-17 VITALS — BP 132/70 | HR 53 | Resp 18

## 2016-02-17 DIAGNOSIS — I129 Hypertensive chronic kidney disease with stage 1 through stage 4 chronic kidney disease, or unspecified chronic kidney disease: Secondary | ICD-10-CM | POA: Diagnosis not present

## 2016-02-17 DIAGNOSIS — N189 Chronic kidney disease, unspecified: Principal | ICD-10-CM

## 2016-02-17 DIAGNOSIS — D631 Anemia in chronic kidney disease: Secondary | ICD-10-CM

## 2016-02-17 DIAGNOSIS — D571 Sickle-cell disease without crisis: Secondary | ICD-10-CM

## 2016-02-17 LAB — HEMOGLOBIN: Hemoglobin: 6.3 g/dL — ABNORMAL LOW (ref 12.0–16.0)

## 2016-02-17 LAB — SAMPLE TO BLOOD BANK

## 2016-02-17 LAB — HEMATOCRIT: HEMATOCRIT: 16.9 % — AB (ref 35.0–47.0)

## 2016-02-17 MED ORDER — EPOETIN ALFA 40000 UNIT/ML IJ SOLN
40000.0000 [IU] | Freq: Once | INTRAMUSCULAR | Status: AC
  Administered 2016-02-17: 40000 [IU] via SUBCUTANEOUS
  Filled 2016-02-17: qty 1

## 2016-02-19 ENCOUNTER — Inpatient Hospital Stay: Payer: Medicare Other

## 2016-02-19 VITALS — BP 120/68 | HR 56 | Resp 18

## 2016-02-19 DIAGNOSIS — N189 Chronic kidney disease, unspecified: Principal | ICD-10-CM

## 2016-02-19 DIAGNOSIS — I129 Hypertensive chronic kidney disease with stage 1 through stage 4 chronic kidney disease, or unspecified chronic kidney disease: Secondary | ICD-10-CM | POA: Diagnosis not present

## 2016-02-19 DIAGNOSIS — D631 Anemia in chronic kidney disease: Secondary | ICD-10-CM

## 2016-02-19 DIAGNOSIS — D571 Sickle-cell disease without crisis: Secondary | ICD-10-CM

## 2016-02-19 LAB — HEMOGLOBIN: HEMOGLOBIN: 6.2 g/dL — AB (ref 12.0–16.0)

## 2016-02-19 LAB — SAMPLE TO BLOOD BANK

## 2016-02-19 LAB — HEMATOCRIT: HCT: 17 % — ABNORMAL LOW (ref 35.0–47.0)

## 2016-02-19 MED ORDER — EPOETIN ALFA 40000 UNIT/ML IJ SOLN
40000.0000 [IU] | Freq: Once | INTRAMUSCULAR | Status: AC
  Administered 2016-02-19: 40000 [IU] via SUBCUTANEOUS
  Filled 2016-02-19: qty 1

## 2016-02-20 ENCOUNTER — Inpatient Hospital Stay: Payer: Medicare Other

## 2016-02-20 ENCOUNTER — Encounter
Admission: RE | Admit: 2016-02-20 | Discharge: 2016-02-20 | Disposition: A | Discharge status: Home / Self Care | Payer: Medicare Other | Source: Ambulatory Visit | Attending: Vascular Surgery | Admitting: Vascular Surgery

## 2016-02-20 VITALS — BP 127/56 | HR 56 | Temp 98.3°F | Resp 18

## 2016-02-20 DIAGNOSIS — Z01812 Encounter for preprocedural laboratory examination: Secondary | ICD-10-CM | POA: Diagnosis present

## 2016-02-20 DIAGNOSIS — D631 Anemia in chronic kidney disease: Secondary | ICD-10-CM

## 2016-02-20 DIAGNOSIS — N189 Chronic kidney disease, unspecified: Principal | ICD-10-CM

## 2016-02-20 DIAGNOSIS — I129 Hypertensive chronic kidney disease with stage 1 through stage 4 chronic kidney disease, or unspecified chronic kidney disease: Secondary | ICD-10-CM | POA: Diagnosis not present

## 2016-02-20 LAB — TYPE AND SCREEN
ABO/RH(D): O POS
ANTIBODY SCREEN: NEGATIVE
Extend sample reason: TRANSFUSED

## 2016-02-20 LAB — BASIC METABOLIC PANEL
ANION GAP: 9 (ref 5–15)
BUN: 59 mg/dL — ABNORMAL HIGH (ref 6–20)
CHLORIDE: 105 mmol/L (ref 101–111)
CO2: 23 mmol/L (ref 22–32)
CREATININE: 3.82 mg/dL — AB (ref 0.44–1.00)
Calcium: 9.1 mg/dL (ref 8.9–10.3)
GFR calc non Af Amer: 12 mL/min — ABNORMAL LOW (ref >60)
GFR, EST AFRICAN AMERICAN: 13 mL/min — AB (ref >60)
Glucose, Bld: 79 mg/dL (ref 65–99)
POTASSIUM: 4.9 mmol/L (ref 3.5–5.1)
SODIUM: 137 mmol/L (ref 135–145)

## 2016-02-20 LAB — PROTIME-INR
INR: 1.11
PROTHROMBIN TIME: 14.4 s (ref 11.4–15.2)

## 2016-02-20 LAB — APTT: aPTT: 31 seconds (ref 24–36)

## 2016-02-20 MED ORDER — SODIUM CHLORIDE 0.9% FLUSH
10.0000 mL | Freq: Once | INTRAVENOUS | Status: AC
  Administered 2016-02-20: 10 mL via INTRAVENOUS
  Filled 2016-02-20: qty 10

## 2016-02-20 MED ORDER — SODIUM CHLORIDE 0.9 % IV SOLN
510.0000 mg | Freq: Once | INTRAVENOUS | Status: AC
  Administered 2016-02-20: 510 mg via INTRAVENOUS
  Filled 2016-02-20: qty 17

## 2016-02-20 MED ORDER — HEPARIN SOD (PORK) LOCK FLUSH 100 UNIT/ML IV SOLN
500.0000 [IU] | Freq: Once | INTRAVENOUS | Status: AC
  Administered 2016-02-20: 500 [IU] via INTRAVENOUS
  Filled 2016-02-20: qty 5

## 2016-02-20 MED ORDER — SODIUM CHLORIDE 0.9 % IV SOLN
Freq: Once | INTRAVENOUS | Status: AC
  Administered 2016-02-20: 14:00:00 via INTRAVENOUS
  Filled 2016-02-20: qty 1000

## 2016-02-20 NOTE — Pre-Procedure Instructions (Signed)
Dr. Zoila Shutter office notified of need for pulmonary clearance. Dr. Etta Quill office notified of need for cardiac clearance.  Will be seen in the office tomorrow.

## 2016-02-20 NOTE — Patient Instructions (Signed)
  Your procedure is scheduled [place]:[id number]. 02/27/16 Report to Day Surgery. To find out your arrival time please call [phone] between [place] on Wed. 02/26/16.  Remember: Instructions that are not followed completely may result in serious medical risk, up to and including death, or upon the discretion of your surgeon and anesthesiologist your surgery may need to be rescheduled.    __x__ 1. Do not eat food or drink liquids after midnight. No gum chewing or hard candies.     __x__ 2. No Alcohol for 24 hours before or after surgery.   __x__ 3. Do Not Smoke For 24 Hours Prior to Your Surgery.   ____ 4. Bring all medications with you on the day of surgery if instructed.    __x__ 5. Notify your doctor if there is any change in your medical condition     (cold, fever, infections).       Do not wear jewelry, make-up, hairpins, clips or nail polish.  Do not wear lotions, powders, or perfumes. You may wear deodorant.  Do not shave 48 hours prior to surgery. Men may shave face and neck.  Do not bring valuables to the hospital.    [facility] is not responsible for any belongings or valuables.               Contacts, dentures or bridgework may not be worn into surgery.  Leave your suitcase in the car. After surgery it may be brought to your room.  For patients admitted to the hospital, discharge time is determined by your                treatment team.   Patients discharged the day of surgery will not be allowed to drive home.   Please read over the following fact sheets that you were given:      __x__ Take these medicines the morning of surgery with A SIP OF WATER:    1. amiodarone (PACERONE) 200 MG tablet  2. HYDROcodone-acetaminophen (NORCO/VICODIN) 5-325 MG tablet if needed  3.   4.  5.  6.  ____ Fleet Enema (as directed)   __x__ Use CHG Soap as directed  __x__ Use inhalers on the day of surgery  ____ Stop metformin 2 days prior to surgery    ____ Take 1/2 of usual  insulin dose the night before surgery and none on the morning of surgery.   ____ Stop Coumadin/Plavix/aspirin on  __x__ Stop Anti-inflammatories on today  Take vicodin or tylenol only   ____ Stop supplements until after surgery.    __x__ Bring C-Pap to the hospital.

## 2016-02-20 NOTE — Pre-Procedure Instructions (Signed)
Patient was notified that the CBC and Diff needs to be redrawn. Message left to call us back..  She will be going to the cancer center next week and it could possibly be drawn during that visit.

## 2016-02-20 NOTE — Pre-Procedure Instructions (Signed)
Will have cbc and diff redrawn at cancer center tomorrow

## 2016-02-21 ENCOUNTER — Inpatient Hospital Stay: Payer: Medicare Other

## 2016-02-21 ENCOUNTER — Other Ambulatory Visit: Payer: Self-pay

## 2016-02-21 ENCOUNTER — Telehealth: Payer: Self-pay

## 2016-02-21 VITALS — BP 121/64 | HR 54 | Resp 20

## 2016-02-21 DIAGNOSIS — D571 Sickle-cell disease without crisis: Secondary | ICD-10-CM

## 2016-02-21 DIAGNOSIS — N184 Chronic kidney disease, stage 4 (severe): Secondary | ICD-10-CM

## 2016-02-21 DIAGNOSIS — N189 Chronic kidney disease, unspecified: Principal | ICD-10-CM

## 2016-02-21 DIAGNOSIS — D631 Anemia in chronic kidney disease: Secondary | ICD-10-CM

## 2016-02-21 DIAGNOSIS — I129 Hypertensive chronic kidney disease with stage 1 through stage 4 chronic kidney disease, or unspecified chronic kidney disease: Secondary | ICD-10-CM | POA: Diagnosis not present

## 2016-02-21 LAB — CBC WITH DIFFERENTIAL/PLATELET
Basophils Absolute: 0.1 10*3/uL (ref 0–0.1)
Basophils Relative: 1 %
EOS ABS: 0.3 10*3/uL (ref 0–0.7)
EOS PCT: 4 %
HEMATOCRIT: 16 % — AB (ref 35.0–47.0)
Hemoglobin: 5.8 g/dL — ABNORMAL LOW (ref 12.0–16.0)
Lymphocytes Relative: 16 %
Lymphs Abs: 1.3 10*3/uL (ref 1.0–3.6)
MCH: 36.6 pg — ABNORMAL HIGH (ref 26.0–34.0)
MCHC: 36.5 g/dL — ABNORMAL HIGH (ref 32.0–36.0)
MCV: 100.2 fL — ABNORMAL HIGH (ref 80.0–100.0)
MONOS PCT: 15 %
Monocytes Absolute: 1.2 10*3/uL — ABNORMAL HIGH (ref 0.2–0.9)
Myelocytes: 2 %
NEUTROS ABS: 5 10*3/uL (ref 1.4–6.5)
NEUTROS PCT: 62 %
Platelets: 153 10*3/uL (ref 150–440)
RBC: 1.6 MIL/uL — AB (ref 3.80–5.20)
RDW: 31.6 % — AB (ref 11.5–14.5)
WBC: 7.9 10*3/uL (ref 4.0–10.5)
nRBC: 28 /100 WBC — ABNORMAL HIGH

## 2016-02-21 LAB — SAMPLE TO BLOOD BANK

## 2016-02-21 MED ORDER — EPOETIN ALFA 40000 UNIT/ML IJ SOLN
40000.0000 [IU] | Freq: Once | INTRAMUSCULAR | Status: AC
  Administered 2016-02-21: 40000 [IU] via SUBCUTANEOUS
  Filled 2016-02-21: qty 1

## 2016-02-21 NOTE — Pre-Procedure Instructions (Signed)
Note sent to Dr. Bunnie Domino office advising them that we need a new H&P.  H&P dated 01/08/16 is outdated.

## 2016-02-21 NOTE — Telephone Encounter (Signed)
Patient is scheduled for AV fistula placement for preparation of possible need of future dialysis.  The procedure is scheduled on 02/27/16 and patient would like to get Dr. Aletha Halim opinion on the need of a blood transfusion before procedure.  HGB today is 5.8 and procrit was given.    She is scheduled next Mon, Wed, Fri for labs possible Procrit.

## 2016-02-21 NOTE — Telephone Encounter (Signed)
Per md plan 2 units of blood on Wed. 9/27

## 2016-02-24 ENCOUNTER — Inpatient Hospital Stay: Payer: Medicare Other

## 2016-02-24 ENCOUNTER — Other Ambulatory Visit: Payer: Self-pay | Admitting: *Deleted

## 2016-02-24 ENCOUNTER — Other Ambulatory Visit: Payer: Self-pay | Admitting: Internal Medicine

## 2016-02-24 VITALS — BP 110/61 | HR 64 | Resp 20

## 2016-02-24 DIAGNOSIS — I129 Hypertensive chronic kidney disease with stage 1 through stage 4 chronic kidney disease, or unspecified chronic kidney disease: Secondary | ICD-10-CM | POA: Diagnosis not present

## 2016-02-24 DIAGNOSIS — N189 Chronic kidney disease, unspecified: Principal | ICD-10-CM

## 2016-02-24 DIAGNOSIS — D631 Anemia in chronic kidney disease: Secondary | ICD-10-CM

## 2016-02-24 DIAGNOSIS — D571 Sickle-cell disease without crisis: Secondary | ICD-10-CM

## 2016-02-24 LAB — SAMPLE TO BLOOD BANK

## 2016-02-24 LAB — HEMOGLOBIN: Hemoglobin: 6.4 g/dL — ABNORMAL LOW (ref 12.0–16.0)

## 2016-02-24 LAB — HEMATOCRIT: HCT: 17.7 % — ABNORMAL LOW (ref 35.0–47.0)

## 2016-02-24 MED ORDER — EPOETIN ALFA 40000 UNIT/ML IJ SOLN
40000.0000 [IU] | Freq: Once | INTRAMUSCULAR | Status: AC
  Administered 2016-02-24: 40000 [IU] via SUBCUTANEOUS
  Filled 2016-02-24: qty 1

## 2016-02-24 NOTE — Pre-Procedure Instructions (Signed)
Jane Short 02/21/16

## 2016-02-24 NOTE — Progress Notes (Signed)
x

## 2016-02-24 NOTE — Pre-Procedure Instructions (Signed)
NEEDS CLEARANCE BY DR Mortimer Fries.SEEN 7/17. NO CLEARANCE NOTE YET

## 2016-02-26 ENCOUNTER — Inpatient Hospital Stay: Payer: Medicare Other

## 2016-02-26 VITALS — BP 118/54 | HR 58 | Temp 99.0°F | Resp 18

## 2016-02-26 DIAGNOSIS — N189 Chronic kidney disease, unspecified: Secondary | ICD-10-CM

## 2016-02-26 DIAGNOSIS — D571 Sickle-cell disease without crisis: Secondary | ICD-10-CM

## 2016-02-26 DIAGNOSIS — D631 Anemia in chronic kidney disease: Secondary | ICD-10-CM

## 2016-02-26 DIAGNOSIS — I129 Hypertensive chronic kidney disease with stage 1 through stage 4 chronic kidney disease, or unspecified chronic kidney disease: Secondary | ICD-10-CM | POA: Diagnosis not present

## 2016-02-26 LAB — PREPARE RBC (CROSSMATCH)

## 2016-02-26 LAB — HEMATOCRIT: HCT: 17.2 % — ABNORMAL LOW (ref 35.0–47.0)

## 2016-02-26 LAB — HEMOGLOBIN: Hemoglobin: 6.2 g/dL — ABNORMAL LOW (ref 12.0–16.0)

## 2016-02-26 MED ORDER — SODIUM CHLORIDE 0.9 % IV SOLN
INTRAVENOUS | Status: DC
  Administered 2016-02-26: 10:00:00 via INTRAVENOUS
  Filled 2016-02-26: qty 1000

## 2016-02-26 MED ORDER — SODIUM CHLORIDE 0.9% FLUSH
10.0000 mL | Freq: Once | INTRAVENOUS | Status: AC
  Administered 2016-02-26: 10 mL via INTRAVENOUS
  Filled 2016-02-26: qty 10

## 2016-02-26 MED ORDER — DIPHENHYDRAMINE HCL 25 MG PO CAPS
25.0000 mg | ORAL_CAPSULE | Freq: Once | ORAL | Status: AC
  Administered 2016-02-26: 25 mg via ORAL
  Filled 2016-02-26: qty 1

## 2016-02-26 MED ORDER — ACETAMINOPHEN 325 MG PO TABS
650.0000 mg | ORAL_TABLET | Freq: Once | ORAL | Status: AC
  Administered 2016-02-26: 650 mg via ORAL
  Filled 2016-02-26: qty 2

## 2016-02-26 MED ORDER — HEPARIN SOD (PORK) LOCK FLUSH 100 UNIT/ML IV SOLN
500.0000 [IU] | Freq: Once | INTRAVENOUS | Status: AC
  Administered 2016-02-26: 500 [IU] via INTRAVENOUS
  Filled 2016-02-26: qty 5

## 2016-02-26 MED ORDER — EPOETIN ALFA 40000 UNIT/ML IJ SOLN
40000.0000 [IU] | Freq: Once | INTRAMUSCULAR | Status: AC
  Administered 2016-02-26: 40000 [IU] via SUBCUTANEOUS
  Filled 2016-02-26: qty 1

## 2016-02-26 MED ORDER — FUROSEMIDE 10 MG/ML IJ SOLN
20.0000 mg | Freq: Once | INTRAMUSCULAR | Status: AC
  Administered 2016-02-26: 20 mg via INTRAVENOUS
  Filled 2016-02-26: qty 2

## 2016-02-26 NOTE — Pre-Procedure Instructions (Signed)
CALL FROM RHONDA AT DR KASA'S. DIFFICULT TO CONTACT PATIENT. HAS APPT WITH DR Mortimer Fries FOR 02/27/16 FOR CLEARANCE. NOTIFIED April AT DR DEW'S TO SEE IF WAIT UNTIL 02/27/16 TO SEE IF CLEARED OR RESCHEDULE.

## 2016-02-27 ENCOUNTER — Ambulatory Visit (INDEPENDENT_AMBULATORY_CARE_PROVIDER_SITE_OTHER): Payer: Medicare Other | Admitting: Internal Medicine

## 2016-02-27 ENCOUNTER — Ambulatory Visit
Admission: RE | Admit: 2016-02-27 | Discharge: 2016-02-27 | Disposition: A | Discharge status: Home / Self Care | Payer: Medicare Other | Source: Ambulatory Visit | Attending: Vascular Surgery | Admitting: Vascular Surgery

## 2016-02-27 ENCOUNTER — Encounter: Payer: Self-pay | Admitting: *Deleted

## 2016-02-27 ENCOUNTER — Ambulatory Visit: Payer: Medicare Other | Admitting: Anesthesiology

## 2016-02-27 ENCOUNTER — Encounter
Admission: RE | Disposition: A | Discharge status: Home / Self Care | Payer: Self-pay | Source: Ambulatory Visit | Attending: Vascular Surgery

## 2016-02-27 ENCOUNTER — Encounter: Payer: Self-pay | Admitting: Internal Medicine

## 2016-02-27 VITALS — BP 132/62 | HR 54 | Ht 67.0 in | Wt 190.0 lb

## 2016-02-27 DIAGNOSIS — I509 Heart failure, unspecified: Secondary | ICD-10-CM | POA: Diagnosis not present

## 2016-02-27 DIAGNOSIS — Z8249 Family history of ischemic heart disease and other diseases of the circulatory system: Secondary | ICD-10-CM | POA: Diagnosis not present

## 2016-02-27 DIAGNOSIS — D696 Thrombocytopenia, unspecified: Secondary | ICD-10-CM | POA: Insufficient documentation

## 2016-02-27 DIAGNOSIS — J449 Chronic obstructive pulmonary disease, unspecified: Secondary | ICD-10-CM

## 2016-02-27 DIAGNOSIS — D571 Sickle-cell disease without crisis: Secondary | ICD-10-CM | POA: Diagnosis not present

## 2016-02-27 DIAGNOSIS — L439 Lichen planus, unspecified: Secondary | ICD-10-CM | POA: Insufficient documentation

## 2016-02-27 DIAGNOSIS — D631 Anemia in chronic kidney disease: Secondary | ICD-10-CM | POA: Insufficient documentation

## 2016-02-27 DIAGNOSIS — Z9841 Cataract extraction status, right eye: Secondary | ICD-10-CM | POA: Insufficient documentation

## 2016-02-27 DIAGNOSIS — Z833 Family history of diabetes mellitus: Secondary | ICD-10-CM | POA: Diagnosis not present

## 2016-02-27 DIAGNOSIS — Z9049 Acquired absence of other specified parts of digestive tract: Secondary | ICD-10-CM | POA: Diagnosis not present

## 2016-02-27 DIAGNOSIS — I132 Hypertensive heart and chronic kidney disease with heart failure and with stage 5 chronic kidney disease, or end stage renal disease: Secondary | ICD-10-CM | POA: Diagnosis not present

## 2016-02-27 DIAGNOSIS — Z881 Allergy status to other antibiotic agents status: Secondary | ICD-10-CM | POA: Insufficient documentation

## 2016-02-27 DIAGNOSIS — Z803 Family history of malignant neoplasm of breast: Secondary | ICD-10-CM | POA: Diagnosis not present

## 2016-02-27 DIAGNOSIS — E559 Vitamin D deficiency, unspecified: Secondary | ICD-10-CM | POA: Insufficient documentation

## 2016-02-27 DIAGNOSIS — M109 Gout, unspecified: Secondary | ICD-10-CM | POA: Diagnosis not present

## 2016-02-27 DIAGNOSIS — M810 Age-related osteoporosis without current pathological fracture: Secondary | ICD-10-CM | POA: Insufficient documentation

## 2016-02-27 DIAGNOSIS — Z992 Dependence on renal dialysis: Secondary | ICD-10-CM

## 2016-02-27 DIAGNOSIS — Z87891 Personal history of nicotine dependence: Secondary | ICD-10-CM | POA: Diagnosis not present

## 2016-02-27 DIAGNOSIS — Z888 Allergy status to other drugs, medicaments and biological substances status: Secondary | ICD-10-CM | POA: Diagnosis not present

## 2016-02-27 DIAGNOSIS — Z9842 Cataract extraction status, left eye: Secondary | ICD-10-CM | POA: Diagnosis not present

## 2016-02-27 DIAGNOSIS — N186 End stage renal disease: Secondary | ICD-10-CM | POA: Diagnosis present

## 2016-02-27 HISTORY — PX: AV FISTULA PLACEMENT: SHX1204

## 2016-02-27 LAB — POTASSIUM: Potassium: 4.8 mmol/L (ref 3.5–5.1)

## 2016-02-27 LAB — TYPE AND SCREEN
ABO/RH(D): O POS
Antibody Screen: NEGATIVE

## 2016-02-27 SURGERY — INSERTION OF ARTERIOVENOUS (AV) GORE-TEX GRAFT ARM
Anesthesia: General | Site: Arm Lower | Laterality: Left | Wound class: Clean

## 2016-02-27 MED ORDER — LIDOCAINE HCL (CARDIAC) 20 MG/ML IV SOLN
INTRAVENOUS | Status: DC | PRN
  Administered 2016-02-27: 100 mg via INTRAVENOUS

## 2016-02-27 MED ORDER — FLUTICASONE FUROATE-VILANTEROL 200-25 MCG/INH IN AEPB: 1.0000 | INHALATION_SPRAY | Freq: Every day | RESPIRATORY_TRACT | 5 refills | Status: AC

## 2016-02-27 MED ORDER — CEFAZOLIN SODIUM-DEXTROSE 2-4 GM/100ML-% IV SOLN
INTRAVENOUS | Status: AC
  Administered 2016-02-27: 2 g via INTRAVENOUS
  Filled 2016-02-27: qty 100

## 2016-02-27 MED ORDER — CHLORHEXIDINE GLUCONATE CLOTH 2 % EX PADS: 6.0000 | MEDICATED_PAD | Freq: Once | CUTANEOUS | Status: DC

## 2016-02-27 MED ORDER — ONDANSETRON HCL 4 MG/2ML IJ SOLN: 4.0000 mg | Freq: Once | INTRAMUSCULAR | Status: DC | PRN

## 2016-02-27 MED ORDER — HEPARIN SODIUM (PORCINE) 5000 UNIT/ML IJ SOLN
INTRAMUSCULAR | Status: AC
  Filled 2016-02-27: qty 1

## 2016-02-27 MED ORDER — EVICEL 2 ML EX KIT
PACK | CUTANEOUS | Status: AC
  Filled 2016-02-27: qty 1

## 2016-02-27 MED ORDER — ONDANSETRON HCL 4 MG/2ML IJ SOLN
INTRAMUSCULAR | Status: DC | PRN
  Administered 2016-02-27: 4 mg via INTRAVENOUS

## 2016-02-27 MED ORDER — HEMOSTATIC AGENTS (NO CHARGE) OPTIME
TOPICAL | Status: DC | PRN
  Administered 2016-02-27 (×2): 1 via TOPICAL

## 2016-02-27 MED ORDER — SODIUM CHLORIDE 0.9 % IV SOLN
INTRAVENOUS | Status: DC | PRN
  Administered 2016-02-27: 1 mL via INTRAMUSCULAR

## 2016-02-27 MED ORDER — HEPARIN SODIUM (PORCINE) 1000 UNIT/ML IJ SOLN
INTRAMUSCULAR | Status: DC | PRN
  Administered 2016-02-27: 3000 [IU] via INTRAVENOUS

## 2016-02-27 MED ORDER — CEFAZOLIN SODIUM-DEXTROSE 2-4 GM/100ML-% IV SOLN
2.0000 g | INTRAVENOUS | Status: AC
  Administered 2016-02-27: 2 g via INTRAVENOUS

## 2016-02-27 MED ORDER — FENTANYL CITRATE (PF) 100 MCG/2ML IJ SOLN
INTRAMUSCULAR | Status: DC | PRN
  Administered 2016-02-27 (×2): 50 ug via INTRAVENOUS

## 2016-02-27 MED ORDER — PROPOFOL 10 MG/ML IV BOLUS
INTRAVENOUS | Status: DC | PRN
  Administered 2016-02-27: 120 mg via INTRAVENOUS

## 2016-02-27 MED ORDER — FLUTICASONE FUROATE-VILANTEROL 200-25 MCG/INH IN AEPB: 1.0000 | INHALATION_SPRAY | Freq: Every day | RESPIRATORY_TRACT | 0 refills | Status: AC

## 2016-02-27 MED ORDER — FAMOTIDINE 20 MG PO TABS
ORAL_TABLET | ORAL | Status: AC
  Administered 2016-02-27: 20 mg via ORAL
  Filled 2016-02-27: qty 1

## 2016-02-27 MED ORDER — BUPIVACAINE-EPINEPHRINE (PF) 0.5% -1:200000 IJ SOLN
INTRAMUSCULAR | Status: AC
  Filled 2016-02-27: qty 10

## 2016-02-27 MED ORDER — SODIUM CHLORIDE 0.9 % IV SOLN
INTRAVENOUS | Status: DC
  Administered 2016-02-27: 14:00:00 via INTRAVENOUS

## 2016-02-27 MED ORDER — PHENYLEPHRINE HCL 10 MG/ML IJ SOLN
INTRAMUSCULAR | Status: DC | PRN
  Administered 2016-02-27 (×3): 100 ug via INTRAVENOUS
  Administered 2016-02-27: 200 ug via INTRAVENOUS
  Administered 2016-02-27 (×2): 100 ug via INTRAVENOUS
  Administered 2016-02-27 (×2): 200 ug via INTRAVENOUS

## 2016-02-27 MED ORDER — HYDROCODONE-ACETAMINOPHEN 5-325 MG PO TABS: 1.0000 | ORAL_TABLET | Freq: Four times a day (QID) | ORAL | 0 refills | Status: DC | PRN

## 2016-02-27 MED ORDER — FAMOTIDINE 20 MG PO TABS
20.0000 mg | ORAL_TABLET | Freq: Once | ORAL | Status: AC
  Administered 2016-02-27: 20 mg via ORAL

## 2016-02-27 MED ORDER — MIDAZOLAM HCL 2 MG/2ML IJ SOLN
INTRAMUSCULAR | Status: DC | PRN
Start: 2016-02-27 — End: 2016-02-27
  Administered 2016-02-27 (×2): 1 mg via INTRAVENOUS

## 2016-02-27 MED ORDER — FENTANYL CITRATE (PF) 100 MCG/2ML IJ SOLN: 25.0000 ug | INTRAMUSCULAR | Status: DC | PRN

## 2016-02-27 MED ORDER — SODIUM CHLORIDE 0.9 % IV SOLN
INTRAVENOUS | Status: DC | PRN
  Administered 2016-02-27: 15 ug/min via INTRAVENOUS

## 2016-02-27 MED ORDER — GLYCOPYRROLATE 0.2 MG/ML IJ SOLN
INTRAMUSCULAR | Status: DC | PRN
  Administered 2016-02-27: .2 mg via INTRAVENOUS

## 2016-02-27 MED ORDER — EVICEL 2 ML EX KIT
PACK | CUTANEOUS | Status: DC | PRN
  Administered 2016-02-27: 2 mL

## 2016-02-27 SURGICAL SUPPLY — 54 items
BAG DECANTER FOR FLEXI CONT (MISCELLANEOUS) ×2 IMPLANT
BLADE SURG SZ11 CARB STEEL (BLADE) ×2 IMPLANT
BOOT SUTURE AID YELLOW STND (SUTURE) ×2 IMPLANT
BRUSH SCRUB 4% CHG (MISCELLANEOUS) ×2 IMPLANT
CANISTER SUCT 1200ML W/VALVE (MISCELLANEOUS) ×2 IMPLANT
CHLORAPREP W/TINT 26ML (MISCELLANEOUS) ×2 IMPLANT
CLIP SPRNG 6MM S-JAW DBL (CLIP) ×2
DECANTER SPIKE VIAL GLASS SM (MISCELLANEOUS) ×2 IMPLANT
ELECT CAUTERY BLADE 6.4 (BLADE) ×2 IMPLANT
ELECT REM PT RETURN 9FT ADLT (ELECTROSURGICAL) ×2
ELECTRODE REM PT RTRN 9FT ADLT (ELECTROSURGICAL) ×1 IMPLANT
GEL ULTRASOUND 20GR AQUASONIC (MISCELLANEOUS) IMPLANT
GLOVE BIO SURGEON STRL SZ7 (GLOVE) ×4 IMPLANT
GLOVE INDICATOR 7.5 STRL GRN (GLOVE) ×2 IMPLANT
GOWN STRL REUS W/ TWL LRG LVL3 (GOWN DISPOSABLE) ×2 IMPLANT
GOWN STRL REUS W/ TWL XL LVL3 (GOWN DISPOSABLE) ×1 IMPLANT
GOWN STRL REUS W/TWL LRG LVL3 (GOWN DISPOSABLE) ×2
GOWN STRL REUS W/TWL XL LVL3 (GOWN DISPOSABLE) ×1
HEMOSTAT SURGICEL 2X3 (HEMOSTASIS) ×6 IMPLANT
IV NS 500ML (IV SOLUTION) ×1
IV NS 500ML BAXH (IV SOLUTION) ×1 IMPLANT
KIT RM TURNOVER STRD PROC AR (KITS) ×2 IMPLANT
LABEL OR SOLS (LABEL) ×2 IMPLANT
LIQUID BAND (GAUZE/BANDAGES/DRESSINGS) ×4 IMPLANT
LOOP RED MAXI  1X406MM (MISCELLANEOUS) ×1
LOOP VESSEL MAXI 1X406 RED (MISCELLANEOUS) ×1 IMPLANT
LOOP VESSEL MINI 0.8X406 BLUE (MISCELLANEOUS) ×1 IMPLANT
LOOPS BLUE MINI 0.8X406MM (MISCELLANEOUS) ×1
NEEDLE FILTER BLUNT 18X 1/2SAF (NEEDLE) ×1
NEEDLE FILTER BLUNT 18X1 1/2 (NEEDLE) ×1 IMPLANT
NEEDLE HYPO 30X.5 LL (NEEDLE) IMPLANT
NS IRRIG 500ML POUR BTL (IV SOLUTION) ×2 IMPLANT
PACK EXTREMITY ARMC (MISCELLANEOUS) ×2 IMPLANT
PAD PREP 24X41 OB/GYN DISP (PERSONAL CARE ITEMS) ×2 IMPLANT
SOLUTION CELL SAVER (CLIP) ×1 IMPLANT
SPONGE XRAY 4X4 16PLY STRL (MISCELLANEOUS) ×4 IMPLANT
STOCKINETTE STRL 4IN 9604848 (GAUZE/BANDAGES/DRESSINGS) ×2 IMPLANT
SUT GORETEX CV-6TTC-13 36IN (SUTURE) ×4 IMPLANT
SUT MNCRL AB 4-0 PS2 18 (SUTURE) ×4 IMPLANT
SUT PROLENE 6 0 BV (SUTURE) ×8 IMPLANT
SUT PROLENE 7 0 BV 1 (SUTURE) ×20 IMPLANT
SUT SILK 0 SH 30 (SUTURE) ×2 IMPLANT
SUT SILK 2 0 (SUTURE) ×1
SUT SILK 2-0 18XBRD TIE 12 (SUTURE) ×1 IMPLANT
SUT SILK 3 0 (SUTURE) ×1
SUT SILK 3-0 18XBRD TIE 12 (SUTURE) ×1 IMPLANT
SUT SILK 4 0 (SUTURE) ×1
SUT SILK 4-0 18XBRD TIE 12 (SUTURE) ×1 IMPLANT
SUT VIC AB 3-0 SH 27 (SUTURE) ×1
SUT VIC AB 3-0 SH 27X BRD (SUTURE) ×1 IMPLANT
SYR 20CC LL (SYRINGE) ×2 IMPLANT
SYR 3ML LL SCALE MARK (SYRINGE) ×2 IMPLANT
SYR TB 1ML 27GX1/2 LL (SYRINGE) IMPLANT
TOWEL OR 17X26 4PK STRL BLUE (TOWEL DISPOSABLE) ×2 IMPLANT

## 2016-02-27 NOTE — Patient Instructions (Signed)
Low to moderate risk for Breathing problems during and after surgery Will give one dose of BREO while in office Recommend stopping E cigs   Chronic Obstructive Pulmonary Disease Chronic obstructive pulmonary disease (COPD) is a common lung condition in which airflow from the lungs is limited. COPD is a general term that can be used to describe many different lung problems that limit airflow, including both chronic bronchitis and emphysema. If you have COPD, your lung function will probably never return to normal, but there are measures you can take to improve lung function and make yourself feel better. CAUSES   Smoking (common).  Exposure to secondhand smoke.  Genetic problems.  Chronic inflammatory lung diseases or recurrent infections. SYMPTOMS  Shortness of breath, especially with physical activity.  Deep, persistent (chronic) cough with a large amount of thick mucus.  Wheezing.  Rapid breaths (tachypnea).  Gray or bluish discoloration (cyanosis) of the skin, especially in your fingers, toes, or lips.  Fatigue.  Weight loss.  Frequent infections or episodes when breathing symptoms become much worse (exacerbations).  Chest tightness. DIAGNOSIS Your health care provider will take a medical history and perform a physical examination to diagnose COPD. Additional tests for COPD may include:  Lung (pulmonary) function tests.  Chest X-ray.  CT scan.  Blood tests. TREATMENT  Treatment for COPD may include:  Inhaler and nebulizer medicines. These help manage the symptoms of COPD and make your breathing more comfortable.  Supplemental oxygen. Supplemental oxygen is only helpful if you have a low oxygen level in your blood.  Exercise and physical activity. These are beneficial for nearly all people with COPD.  Lung surgery or transplant.  Nutrition therapy to gain weight, if you are underweight.  Pulmonary rehabilitation. This may involve working with a team of  health care providers and specialists, such as respiratory, occupational, and physical therapists. HOME CARE INSTRUCTIONS  Take all medicines (inhaled or pills) as directed by your health care provider.  Avoid over-the-counter medicines or cough syrups that dry up your airway (such as antihistamines) and slow down the elimination of secretions unless instructed otherwise by your health care provider.  If you are a smoker, the most important thing that you can do is stop smoking. Continuing to smoke will cause further lung damage and breathing trouble. Ask your health care provider for help with quitting smoking. He or she can direct you to community resources or hospitals that provide support.  Avoid exposure to irritants such as smoke, chemicals, and fumes that aggravate your breathing.  Use oxygen therapy and pulmonary rehabilitation if directed by your health care provider. If you require home oxygen therapy, ask your health care provider whether you should purchase a pulse oximeter to measure your oxygen level at home.  Avoid contact with individuals who have a contagious illness.  Avoid extreme temperature and humidity changes.  Eat healthy foods. Eating smaller, more frequent meals and resting before meals may help you maintain your strength.  Stay active, but balance activity with periods of rest. Exercise and physical activity will help you maintain your ability to do things you want to do.  Preventing infection and hospitalization is very important when you have COPD. Make sure to receive all the vaccines your health care provider recommends, especially the pneumococcal and influenza vaccines. Ask your health care provider whether you need a pneumonia vaccine.  Learn and use relaxation techniques to manage stress.  Learn and use controlled breathing techniques as directed by your health care provider. Controlled  breathing techniques include:  Pursed lip breathing. Start by  breathing in (inhaling) through your nose for 1 second. Then, purse your lips as if you were going to whistle and breathe out (exhale) through the pursed lips for 2 seconds.  Diaphragmatic breathing. Start by putting one hand on your abdomen just above your waist. Inhale slowly through your nose. The hand on your abdomen should move out. Then purse your lips and exhale slowly. You should be able to feel the hand on your abdomen moving in as you exhale.  Learn and use controlled coughing to clear mucus from your lungs. Controlled coughing is a series of short, progressive coughs. The steps of controlled coughing are: 1. Lean your head slightly forward. 2. Breathe in deeply using diaphragmatic breathing. 3. Try to hold your breath for 3 seconds. 4. Keep your mouth slightly open while coughing twice. 5. Spit any mucus out into a tissue. 6. Rest and repeat the steps once or twice as needed. SEEK MEDICAL CARE IF:  You are coughing up more mucus than usual.  There is a change in the color or thickness of your mucus.  Your breathing is more labored than usual.  Your breathing is faster than usual. SEEK IMMEDIATE MEDICAL CARE IF:  You have shortness of breath while you are resting.  You have shortness of breath that prevents you from:  Being able to talk.  Performing your usual physical activities.  You have chest pain lasting longer than 5 minutes.  Your skin color is more cyanotic than usual.  You measure low oxygen saturations for longer than 5 minutes with a pulse oximeter. MAKE SURE YOU:  Understand these instructions.  Will watch your condition.  Will get help right away if you are not doing well or get worse.   This information is not intended to replace advice given to you by your health care provider. Make sure you discuss any questions you have with your health care provider.   Document Released: 02/25/2005 Document Revised: 06/08/2014 Document Reviewed:  01/12/2013 Elsevier Interactive Patient Education [employer].

## 2016-02-27 NOTE — Anesthesia Postprocedure Evaluation (Addendum)
Anesthesia Post Note  Patient: Jane Short  Procedure(s) Performed: Procedure(s) (LRB): INSERTION OF ARTERIOVENOUS (AV) GORE-TEX GRAFT ARM ( FOREARM LOOP ) (Left)  Patient location during evaluation: PACU Anesthesia Type: General Level of consciousness: awake and alert Pain management: pain level controlled Vital Signs Assessment: post-procedure vital signs reviewed and stable Respiratory status: spontaneous breathing, nonlabored ventilation, respiratory function stable and patient connected to nasal cannula oxygen Cardiovascular status: blood pressure returned to baseline and stable Postop Assessment: no signs of nausea or vomiting Anesthetic complications: no    Last Vitals:  Vitals:   02/27/16 1906 02/27/16 1923  BP: (!) 133/48 (!) 141/45  Pulse: 61 (!) 56  Resp: 16 16  Temp: 36.3 C     Last Pain:  Vitals:   02/27/16 1923  TempSrc:   PainSc: 0-No pain                 Precious Haws Piscitello

## 2016-02-27 NOTE — Addendum Note (Signed)
Addended by: Oscar La R on: 02/27/2016 11:08 AM   Modules accepted: Orders

## 2016-02-27 NOTE — Op Note (Signed)
[facility]   OPERATIVE NOTE   PROCEDURE: Left brachiocephalic arteriovenous fistula placement  PRE-OPERATIVE DIAGNOSIS: 1.  ESRD        POST-OPERATIVE DIAGNOSIS: 1. ESRD       SURGEON: Leotis Pain, MD  ASSISTANT(S): Hezzie Bump, PA-C  ANESTHESIA: general  ESTIMATED BLOOD LOSS: 200 cc  FINDING(S): none  SPECIMEN(S):  none  INDICATIONS:   Jane Short is a 65 y.o. female who presents with renal failure in need of pemanent dialysis acces.  The patient is scheduled for left arm AVF placement.  The patient is aware the risks include but are not limited to: bleeding, infection, steal syndrome, nerve damage, ischemic monomelic neuropathy, failure to mature, and need for additional procedures.  The patient is aware of the risks of the procedure and elects to proceed forward.  DESCRIPTION: After full informed written consent was obtained from the patient, the patient was brought back to the operating room and placed supine upon the operating table.  Prior to induction, the patient received IV antibiotics.   After obtaining adequate anesthesia, the patient was then prepped and draped in the standard fashion for a left arm access procedure.  I made a curvilinear incision at the level of the antecubital fossa and dissected through the subcutaneous tissue and fascia to gain exposure of the brachial artery.  This was noted to be patent and adequate in size for fistula creation.  This was dissected out proximally and distally and prepared for control with vessel loops. The brachial vein at this location was marginal for graft creation.  I then dissected out the cephalic vein.  This was noted to be patent and seemed adequate in size for fistula creation.  I then gave the patient 3000 units of intravenous heparin.  The vein was marked for orientation and the distal segment of the vein was ligated with a  2-0 silk, and the vein was transected. I passed [facility] dilators up to  a 3 mm dilator without resistance through the cephalic vein.  I then instilled the heparinized saline into the vein and clamped it.  At this point, I reset my exposure of the brachial artery and pulled up control on the vessel loops.  I made an arteriotomy with a #11 blade, and then I extended the arteriotomy with a Potts scissor.  I injected heparinized saline proximal and distal to this arteriotomy.  The vein was then sewn to the artery in an end-to-side configuration with a running stitch of 6-0 Prolene.  Prior to completing this anastomosis, I allowed the vein and artery to backbleed.  There was no evidence of clot from any vessels.  I completed the anastomosis in the usual fashion and then released all vessel loops and clamps. There was a hematoma developing in the more proximal arm, and we extended the incision to find bleeding from the cephalic vein well proximal to the anastomosis.  The vein was smaller and very delicate at this location and it was just beyond a branch.  The branch was ligated and divided between silk ties.  I then repaired the hole in the vein with a series of interrupted 7-0 Prolene sutures.  The vein was delicate, and several new rents had to be repaired with 7-0 Prolene sutures as well. I was able to achieve hemostasis and evacuated as much of the hematoma that had developed as possible. There was a palpable flow in the venous outflow, and there was a palpable pulse in the artery  distal to the anastomosis.  At this point, I irrigated out the surgical wound.  Surgicel and evicel were placed. There was no further active bleeding.  The subcutaneous tissue was reapproximated with a running stitch of 3-0 Vicryl.  The skin was then closed with a 4-0 Monocryl suture.  The skin was then cleaned, dried, and reinforced with Dermabond.  The patient tolerated this procedure well and was taken to the recovery room in stable condition  COMPLICATIONS: None  CONDITION:  Stable   DEW,JASON    02/27/2016, 5:53 PM

## 2016-02-27 NOTE — Transfer of Care (Signed)
Immediate Anesthesia Transfer of Care Note  Patient: [name]  Procedure(s) Performed: Procedure(s): INSERTION OF ARTERIOVENOUS (AV) GORE-TEX GRAFT ARM ( FOREARM LOOP ) (Left)  Patient Location: PACU  Anesthesia Type:General  Level of Consciousness: awake  Airway & Oxygen Therapy: Patient Spontanous Breathing and Patient connected to face mask oxygen  Post-op Assessment: Report given to RN and Post -op Vital signs reviewed and stable  Post vital signs: Reviewed  Last Vitals:  Vitals:   02/27/16 1337 02/27/16 1824  BP: (!) 140/55 (!) 159/61  Pulse: (!) 58 69  Resp: 18 15  Temp: 36.7 C 36.4 C    Last Pain:  Vitals:   02/27/16 1337  TempSrc: Oral  PainSc: 0-No pain         Complications: No apparent anesthesia complications

## 2016-02-27 NOTE — Progress Notes (Signed)
Patient ID: Jane Short, female   DOB: Apr 12, 1951, 65 y.o.   MRN: [id number]  Patient seen in the office today and instructed on use of Breo.  Patient expressed understanding and demonstrated technique.

## 2016-02-27 NOTE — H&P (Signed)
[facility] Admission History & Physical  MRN : [id number]  Jane Short is a 65 y.o. (Aug 18, 1950) female who presents with chief complaint of No chief complaint on file. Marland Kitchen  History of Present Illness: Patient with renal insufficiency, in need of permanent dialysis access for long term dialysis.  Vein mapping suggests no usable superficial vein, so AVG is planned.  Patient has some knee pain but no other issues today.    Current Facility-Administered Medications  Medication Dose Route Frequency Provider Last Rate Last Dose  . 0.9 %  sodium chloride infusion   Intravenous Continuous Andria Frames, MD 50 mL/hr at 02/27/16 1407    . ceFAZolin (ANCEF) 2-4 GM/100ML-% IVPB           . ceFAZolin (ANCEF) IVPB 2g/100 mL premix  2 g Intravenous On Call to [place], PA-C      . Chlorhexidine Gluconate Cloth 2 % PADS 6 each  6 each Topical Once [employer], PA-C       And  . Chlorhexidine Gluconate Cloth 2 % PADS 6 each  6 each Topical Once [employer], PA-C       Facility-Administered Medications Ordered in Other Encounters  Medication Dose Route Frequency Provider Last Rate Last Dose  . 0.9 %  sodium chloride infusion  250 mL Intravenous Once Cammie Sickle, MD      . 0.9 %  sodium chloride infusion  250 mL Intravenous Once Cammie Sickle, MD      . acetaminophen (TYLENOL) tablet 650 mg  650 mg Oral Once Cammie Sickle, MD      . acetaminophen (TYLENOL) tablet 650 mg  650 mg Oral Once Cammie Sickle, MD      . diphenhydrAMINE (BENADRYL) capsule 25 mg  25 mg Oral Once Cammie Sickle, MD      . diphenhydrAMINE (BENADRYL) capsule 25 mg  25 mg Oral Once Cammie Sickle, MD      . furosemide (LASIX) injection 20 mg  20 mg Intravenous Once Cammie Sickle, MD      . furosemide (LASIX) injection 20 mg  20 mg Intravenous Once Cammie Sickle, MD      . heparin lock flush 100 unit/mL  250 Units  Intracatheter PRN Leia Alf, MD      . heparin lock flush 100 unit/mL  500 Units Intracatheter Daily PRN Cammie Sickle, MD      . heparin lock flush 100 unit/mL  250 Units Intracatheter PRN Cammie Sickle, MD      . heparin lock flush 100 unit/mL  500 Units Intracatheter Daily PRN Cammie Sickle, MD      . heparin lock flush 100 unit/mL  250 Units Intracatheter PRN Cammie Sickle, MD      . sodium chloride flush (NS) 0.9 % injection 10 mL  10 mL Intracatheter PRN Cammie Sickle, MD      . sodium chloride flush (NS) 0.9 % injection 10 mL  10 mL Intracatheter PRN Cammie Sickle, MD      . sodium chloride flush (NS) 0.9 % injection 3 mL  3 mL Intracatheter PRN Cammie Sickle, MD      . sodium chloride flush (NS) 0.9 % injection 3 mL  3 mL Intracatheter PRN Cammie Sickle, MD        Past Medical History:  Diagnosis Date  . Anemia   . 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 Cheree Ditto at [facility]  . CHF (congestive heart failure) ([place])   . Congestive heart failure (CHF) ([place])   . Congestive heart failure (CHF) ([place]) 06/2015  . COPD (chronic obstructive pulmonary disease) (H[facility])    symptoms Dr. Patricia Pesa   . Gout   . Hypertension   . Lichen planus   . Osteoporosis 2011   osteopenia  . 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 Sickle cell anemia (HCC)    sickle cell disease  . Sickle cell anemia (H[facility])   . Thrombocytopenia ([place])   . Vitamin D deficiency     Past Surgical History:  Procedure Laterality Date  . CHOLECYSTECTOMY    . COLONOSCOPY  2014   [facility]  . EYE SURGERY     cataract bilateral  . GALLBLADDER SURGERY    . PERIPHERAL VASCULAR CATHETERIZATION Left 06/14/2015   Procedure: Dialysis/Perma Catheter Insertion;  Surgeon: Katha Cabal, MD;  Location: [facility];  Service: Cardiovascular;  Laterality: Left;  . PERIPHERAL VASCULAR CATHETERIZATION N/A 01/23/2016   Procedure: Glori Luis Cath Insertion;  Surgeon: Algernon Huxley, MD;  Location: [facility];  Service: Cardiovascular;  Laterality: N/A;  . TUBAL LIGATION      Social History Social History  Substance Use Topics  . Smoking status: Former Smoker    Packs/day: 0.25    Years: 30.00    Types: Cigarettes    Quit date: 04/29/2015  . Smokeless tobacco: Never Used     Comment: quit 1 month ago  . Alcohol use No    Family History Family History  Problem Relation Age of Onset  . Heart disease Father   . Diabetes Father   . Diabetes Sister   . Cancer Mother     breast cancer  . Cancer Maternal Aunt     Breast cancer    Allergies  Allergen Reactions  . Ramipril     cough  . Septra [Sulfamethoxazole-Trimethoprim]     Joint pain     REVIEW OF SYSTEMS (Negative unless checked)  Constitutional: [] Weight loss  [] Fever  [] Chills Cardiac: [] Chest pain   [] Chest pressure   [] Palpitations   [] Shortness of breath when laying flat   [] Shortness of breath at rest   [x] Shortness of breath with exertion. Vascular:  [] Pain in legs with walking   [] Pain in legs at rest   [] Pain in legs when laying flat   [] Claudication   [] Pain in feet when walking  [] Pain in feet at rest  [] Pain in feet when laying flat   [] History of DVT   [] Phlebitis   [x] Swelling in legs   [] Varicose veins   [] Non-healing ulcers Pulmonary:   [] Uses home oxygen   [] Productive cough   [] Hemoptysis   [] Wheeze  [x] COPD   [] Asthma Neurologic:  [] Dizziness  [] Blackouts   [] Seizures   [] History of stroke   [] History of TIA  [] Aphasia   [] Temporary blindness   [] Dysphagia   [] Weakness or numbness in arms   [] Weakness or numbness in legs Musculoskeletal:  [] Arthritis   [] Joint swelling   [] Joint pain   [] Low back pain Hematologic:  [] Easy bruising  [] Easy bleeding   [] Hypercoagulable state   [x] Anemic  [] Hepatitis Gastrointestinal:  [] Blood in stool   [] Vomiting blood  [] Gastroesophageal reflux/heartburn   [] Difficulty swallowing. Genitourinary:  [x] Chronic kidney disease    [] Difficult urination  [] Frequent urination  [] Burning with urination   [] Blood in urine Skin:  []   Rashes   [] Ulcers   [] Wounds Psychological:  [] History of anxiety   []  History of major depression.  Physical Examination  Vitals:   02/27/16 1337 02/27/16 1416  BP: (!) 140/55   Pulse: (!) 58   Resp: 18   Temp: 98.1 F (36.7 C)   TempSrc: Oral   SpO2: 91% 98%  Weight: 86.2 kg (190 lb)    Body mass index is 29.76 kg/m. Gen: WD/WN, NAD Head: [place]/AT, No temporalis wasting. Prominent temp pulse not noted. Ear/Nose/Throat: Hearing grossly intact, nares w/o erythema or drainage, oropharynx w/o Erythema/Exudate,  Eyes: PERRLA, EOMI.  Neck: Supple, no nuchal rigidity.  No JVD.  Pulmonary:  Good air movement, equal bilaterally, no use of accessory muscles.  Cardiac: RRR, normal S1, S2. Vascular:  Vessel Right Left  Radial Palpable Palpable                                   Gastrointestinal: soft, non-tender/non-distended. No guarding/reflex.  Musculoskeletal: M/S 5/5 throughout.  Extremities without ischemic changes.  No deformity or atrophy.  Neurologic: CN 2-12 intact. Pain and light touch intact in extremities.  Symmetrical.  Speech is fluent. Motor exam as listed above. Psychiatric: Judgment intact, Mood & affect appropriate for pt's clinical situation. Dermatologic: No rashes or ulcers noted.  No cellulitis or open wounds. Lymph : No Cervical, Axillary, or Inguinal lymphadenopathy.     CBC Lab Results  Component Value Date   WBC 7.9 02/21/2016   HGB 6.2 (L) 02/26/2016   HCT 17.2 (L) 02/26/2016   MCV 100.2 (H) 02/21/2016   PLT 153 02/21/2016    BMET    Component Value Date/Time   NA 137 02/20/2016 0827   NA 138 06/20/2012 1400   K 4.8 02/27/2016 1403   K 4.3 06/20/2012 1400   CL 105 02/20/2016 0827   CL 105 06/20/2012 1400   CO2 23 02/20/2016 0827   CO2 24 06/20/2012 1400   GLUCOSE 79 02/20/2016 0827   GLUCOSE 102 (H) 06/20/2012 1400   BUN 59 (H)  02/20/2016 0827   BUN 13 06/20/2012 1400   CREATININE 3.82 (H) 02/20/2016 0827   CREATININE 1.36 (H) 03/22/2013 1522   CALCIUM 9.1 02/20/2016 0827   CALCIUM 8.4 (L) 12/23/2015 1039   GFRNONAA 12 (L) 02/20/2016 0827   GFRNONAA 42 (L) 03/22/2013 1522   GFRAA 13 (L) 02/20/2016 0827   GFRAA 48 (L) 03/22/2013 1522   Estimated Creatinine Clearance: 16.5 mL/min (by C-G formula based on SCr of 3.82 mg/dL (H)).  COAG Lab Results  Component Value Date   INR 1.11 02/20/2016   INR 1.29 06/24/2015   INR 1.26 06/23/2015    Radiology No results found.   Assessment/Plan 1. ESRD. For AVG today for permanent dialysis access.  Will place AVF if cephalic vein is adequate at exploration 2. COPD. Stable.  3. Atrial myxoma.  Follows with cardiologist  4. HTN. Stable on outpatient medications   Rolf Fells, MD  02/27/2016 2:55 PM

## 2016-02-27 NOTE — Anesthesia Preprocedure Evaluation (Addendum)
Anesthesia Evaluation  Patient identified by MRN, date of birth, ID band Patient awake    Reviewed: Allergy & Precautions, NPO status , Patient's Chart, lab work & pertinent test results  History of Anesthesia Complications Negative for: history of anesthetic complications  Airway Mallampati: II       Dental   Pulmonary COPD,  COPD inhaler, former smoker,           Cardiovascular hypertension, Pt. on medications + Peripheral Vascular Disease and +CHF       Neuro/Psych    GI/Hepatic negative GI ROS, Neg liver ROS,   Endo/Other  negative endocrine ROS  Renal/GU ESRFRenal disease     Musculoskeletal   Abdominal   Peds  Hematology  (+) anemia ,   Anesthesia Other Findings   Reproductive/Obstetrics                            Anesthesia Physical Anesthesia Plan  ASA: IV  Anesthesia Plan: General   Post-op Pain Management:    Induction: Intravenous  Airway Management Planned: LMA  Additional Equipment:   Intra-op Plan:   Post-operative Plan:   Informed Consent:   Plan Discussed with:   Anesthesia Plan Comments:         Anesthesia Quick Evaluation

## 2016-02-27 NOTE — Discharge Instructions (Signed)

## 2016-02-27 NOTE — Progress Notes (Signed)
Date: 02/27/2016,   MRN# [id number] Jane Short [birth year] Code Status:  Hosp day:@LENGTHOFSTAYDAYS @ Referring MD: @ATDPROV @     PCP:      Admission                  Current  Jane Short is a 65 y.o. old female seen in consultation for chronic SOB and cough.     CHIEF COMPLAINT:   Follow up Chronic cough and SOB, COPD   HISTORY OF PRESENT ILLNESS  H/o  Vtach/heart failure and Endocarditis   Patient stopped dulera can not afford and not taking at this time Patient has chronic SOB, no fevers, chills, no signs of infection at this time  Patient having Fistula surgery today I have explained she has low-moderate risk of post op pulmonary complications  No signs of infection at this time Patient to call company to set up for CPAP therapy On oxygen at night   PFT 11/13/2015 Ration WNL, FVC 2.8L 50% Fef25/75 2.1L 47% DLCO 50% Pos BD response Assessment-mild small airways obstructive disease, moderate restrictive lung disease      Current Medication:   ALLERGIES   Ramipril and Septra [sulfamethoxazole-trimethoprim]     REVIEW OF SYSTEMS   Review of Systems  Constitutional: Negative for chills, diaphoresis, fever and malaise/fatigue.  HENT: Negative for congestion.   Eyes: Negative for blurred vision.  Respiratory: Positive for shortness of breath. Negative for cough, hemoptysis, sputum production and wheezing.   Cardiovascular: Positive for leg swelling. Negative for chest pain and orthopnea.  Gastrointestinal: Negative for abdominal pain, heartburn and nausea.  Musculoskeletal: Positive for joint pain.  Neurological: Negative for dizziness.  Psychiatric/Behavioral: Negative for depression. The patient is not nervous/anxious.   All other systems reviewed and are negative.    BP 132/62 (BP Location: Right Arm, Cuff Size: Normal)   Pulse (!) 54   Ht 5\' 7"  (1.702 m)   Wt 190 lb (86.2 kg)   SpO2 92%   BMI 29.76 kg/m     PHYSICAL EXAM  Physical  Exam  Constitutional: She is oriented to person, place, and time. No distress.  Neck: Neck supple.  Cardiovascular: Normal rate, regular rhythm and normal heart sounds.   No murmur heard. Pulmonary/Chest: Effort normal and breath sounds normal. No respiratory distress. She has no wheezes. She has no rales.  Musculoskeletal: Normal range of motion. She exhibits no edema.  Neurological: She is alert and oriented to person, place, and time.  Skin: She is not diaphoretic.  Psychiatric: She has a normal mood and affect.       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 Patient to undergo fistula surgery today  1.will start BREO 200 daily and will give first dose in office 2. Albuterol as needed 3.will need CPAP  therapy for OSA. 4.continue oxygen 2 l [place] with exertion and at night 5..follow up hem onc as needed   Overall, patient has low-moderate risk for post op pulmonary complications. Patient understands SHe quit cigarettes last year but smokes E-cigs-smoking cessation advised Recommend Incentive spirometry post op, oxygen as needed, BD therapy with Neb therapy if needed   Follow up in 6 months   The Patient requires high complexity decision making for assessment and support, frequent evaluation and titration of therapies, application of advanced monitoring technologies  and extensive interpretation of multiple databases.  Patient is satisfied with Plan of action and management.   Corrin Parker, M.D.  [facility]  Medical Director [place] Director [facility] Cardio-Pulmonary Department

## 2016-02-27 NOTE — Anesthesia Procedure Notes (Signed)
Procedure Name: LMA Insertion Date/Time: 02/27/2016 4:09 PM Performed by: Silvana Newness Pre-anesthesia Checklist: Patient identified, Emergency Drugs available, Suction available, Patient being monitored and Timeout performed Patient Re-evaluated:Patient Re-evaluated prior to inductionOxygen Delivery Method: Circle system utilized Preoxygenation: Pre-oxygenation with 100% oxygen Intubation Type: IV induction Ventilation: Mask ventilation without difficulty LMA: LMA inserted LMA Size: 4.0 Number of attempts: 1 Placement Confirmation: positive ETCO2 and breath sounds checked- equal and bilateral Tube secured with: Tape Dental Injury: Teeth and Oropharynx as per pre-operative assessment

## 2016-02-28 ENCOUNTER — Inpatient Hospital Stay: Payer: Medicare Other

## 2016-02-28 ENCOUNTER — Encounter: Payer: Self-pay | Admitting: Vascular Surgery

## 2016-02-28 LAB — TYPE AND SCREEN
ABO/RH(D): O POS
ANTIBODY SCREEN: NEGATIVE
UNIT DIVISION: 0
UNIT DIVISION: 0
UNIT DIVISION: 0
Unit division: 0

## 2016-03-02 ENCOUNTER — Telehealth: Payer: Self-pay

## 2016-03-02 ENCOUNTER — Inpatient Hospital Stay: Payer: Medicare Other | Attending: Internal Medicine

## 2016-03-02 ENCOUNTER — Inpatient Hospital Stay: Payer: Medicare Other

## 2016-03-02 VITALS — BP 121/66 | HR 67

## 2016-03-02 DIAGNOSIS — D631 Anemia in chronic kidney disease: Secondary | ICD-10-CM | POA: Diagnosis not present

## 2016-03-02 DIAGNOSIS — N189 Chronic kidney disease, unspecified: Principal | ICD-10-CM

## 2016-03-02 DIAGNOSIS — Z79899 Other long term (current) drug therapy: Secondary | ICD-10-CM | POA: Diagnosis not present

## 2016-03-02 DIAGNOSIS — N184 Chronic kidney disease, stage 4 (severe): Secondary | ICD-10-CM | POA: Diagnosis not present

## 2016-03-02 DIAGNOSIS — D571 Sickle-cell disease without crisis: Secondary | ICD-10-CM

## 2016-03-02 DIAGNOSIS — I129 Hypertensive chronic kidney disease with stage 1 through stage 4 chronic kidney disease, or unspecified chronic kidney disease: Secondary | ICD-10-CM | POA: Diagnosis present

## 2016-03-02 LAB — HEMATOCRIT: HEMATOCRIT: 18.7 % — AB (ref 35.0–47.0)

## 2016-03-02 LAB — SAMPLE TO BLOOD BANK

## 2016-03-02 LAB — HEMOGLOBIN: HEMOGLOBIN: 6.7 g/dL — AB (ref 12.0–16.0)

## 2016-03-02 MED ORDER — EPOETIN ALFA 40000 UNIT/ML IJ SOLN
40000.0000 [IU] | Freq: Once | INTRAMUSCULAR | Status: AC
  Administered 2016-03-02: 40000 [IU] via SUBCUTANEOUS
  Filled 2016-03-02: qty 1

## 2016-03-02 NOTE — Telephone Encounter (Signed)
While waiting on lab results for Procrit injection today patient states that she needs Dr. B to do something about her left knee.    Patient c/o left knee pain that is making it difficult for her to stand from seated position so she is using her left arm to help her stand.  The left arm is the arm that she had her procedure in last Thursday and is having some swelling in that hand due to the overuse.

## 2016-03-02 NOTE — Telephone Encounter (Signed)
Spoke with Dr. Rogue Bussing and he recommends patient to contact PCP for knee treatment.    Patient reports that Dr. Gilford Rile was her PCP and she is not longer at that practice.  Advised her to call PCP office to check if they have another MD seeing Dr. Thomes Dinning patients or she could consider a walk in clinic.  Patient agreeable.

## 2016-03-04 ENCOUNTER — Inpatient Hospital Stay: Payer: Medicare Other

## 2016-03-04 VITALS — BP 145/71 | HR 63 | Resp 18

## 2016-03-04 DIAGNOSIS — I129 Hypertensive chronic kidney disease with stage 1 through stage 4 chronic kidney disease, or unspecified chronic kidney disease: Secondary | ICD-10-CM | POA: Diagnosis not present

## 2016-03-04 DIAGNOSIS — D571 Sickle-cell disease without crisis: Secondary | ICD-10-CM

## 2016-03-04 DIAGNOSIS — D631 Anemia in chronic kidney disease: Secondary | ICD-10-CM

## 2016-03-04 DIAGNOSIS — N189 Chronic kidney disease, unspecified: Principal | ICD-10-CM

## 2016-03-04 LAB — HEMOGLOBIN: Hemoglobin: 7 g/dL — ABNORMAL LOW (ref 12.0–16.0)

## 2016-03-04 LAB — SAMPLE TO BLOOD BANK

## 2016-03-04 LAB — HEMATOCRIT: HEMATOCRIT: 20.2 % — AB (ref 35.0–47.0)

## 2016-03-04 MED ORDER — EPOETIN ALFA 40000 UNIT/ML IJ SOLN
40000.0000 [IU] | Freq: Once | INTRAMUSCULAR | Status: AC
  Administered 2016-03-04: 40000 [IU] via SUBCUTANEOUS
  Filled 2016-03-04: qty 1

## 2016-03-05 ENCOUNTER — Inpatient Hospital Stay: Payer: Medicare Other

## 2016-03-06 ENCOUNTER — Inpatient Hospital Stay: Payer: Medicare Other

## 2016-03-06 VITALS — BP 122/65 | HR 67

## 2016-03-06 DIAGNOSIS — D631 Anemia in chronic kidney disease: Secondary | ICD-10-CM

## 2016-03-06 DIAGNOSIS — D571 Sickle-cell disease without crisis: Secondary | ICD-10-CM

## 2016-03-06 DIAGNOSIS — N189 Chronic kidney disease, unspecified: Principal | ICD-10-CM

## 2016-03-06 DIAGNOSIS — I129 Hypertensive chronic kidney disease with stage 1 through stage 4 chronic kidney disease, or unspecified chronic kidney disease: Secondary | ICD-10-CM | POA: Diagnosis not present

## 2016-03-06 LAB — HEMATOCRIT: HCT: 17.9 % — ABNORMAL LOW (ref 35.0–47.0)

## 2016-03-06 LAB — SAMPLE TO BLOOD BANK

## 2016-03-06 LAB — HEMOGLOBIN: Hemoglobin: 6.2 g/dL — ABNORMAL LOW (ref 12.0–16.0)

## 2016-03-06 MED ORDER — EPOETIN ALFA 40000 UNIT/ML IJ SOLN
40000.0000 [IU] | Freq: Once | INTRAMUSCULAR | Status: AC
  Administered 2016-03-06: 40000 [IU] via SUBCUTANEOUS
  Filled 2016-03-06: qty 1

## 2016-03-09 ENCOUNTER — Inpatient Hospital Stay: Payer: Medicare Other

## 2016-03-09 VITALS — BP 120/60 | HR 55

## 2016-03-09 DIAGNOSIS — D571 Sickle-cell disease without crisis: Secondary | ICD-10-CM

## 2016-03-09 DIAGNOSIS — D631 Anemia in chronic kidney disease: Secondary | ICD-10-CM

## 2016-03-09 DIAGNOSIS — N189 Chronic kidney disease, unspecified: Principal | ICD-10-CM

## 2016-03-09 DIAGNOSIS — I129 Hypertensive chronic kidney disease with stage 1 through stage 4 chronic kidney disease, or unspecified chronic kidney disease: Secondary | ICD-10-CM | POA: Diagnosis not present

## 2016-03-09 LAB — HEMATOCRIT: HEMATOCRIT: 16 % — AB (ref 35.0–47.0)

## 2016-03-09 LAB — HEMOGLOBIN: Hemoglobin: 5.7 g/dL — ABNORMAL LOW (ref 12.0–16.0)

## 2016-03-09 MED ORDER — EPOETIN ALFA 40000 UNIT/ML IJ SOLN
40000.0000 [IU] | Freq: Once | INTRAMUSCULAR | Status: AC
  Administered 2016-03-09: 40000 [IU] via SUBCUTANEOUS
  Filled 2016-03-09: qty 1

## 2016-03-10 LAB — SAMPLE TO BLOOD BANK

## 2016-03-11 ENCOUNTER — Inpatient Hospital Stay: Payer: Medicare Other

## 2016-03-11 VITALS — BP 139/84 | HR 56

## 2016-03-11 DIAGNOSIS — D571 Sickle-cell disease without crisis: Secondary | ICD-10-CM

## 2016-03-11 DIAGNOSIS — D631 Anemia in chronic kidney disease: Secondary | ICD-10-CM

## 2016-03-11 DIAGNOSIS — N189 Chronic kidney disease, unspecified: Principal | ICD-10-CM

## 2016-03-11 DIAGNOSIS — I129 Hypertensive chronic kidney disease with stage 1 through stage 4 chronic kidney disease, or unspecified chronic kidney disease: Secondary | ICD-10-CM | POA: Diagnosis not present

## 2016-03-11 LAB — SAMPLE TO BLOOD BANK

## 2016-03-11 LAB — HEMOGLOBIN: HEMOGLOBIN: 6.2 g/dL — AB (ref 12.0–16.0)

## 2016-03-11 LAB — HEMATOCRIT: HEMATOCRIT: 17.5 % — AB (ref 35.0–47.0)

## 2016-03-11 MED ORDER — EPOETIN ALFA 40000 UNIT/ML IJ SOLN
40000.0000 [IU] | Freq: Once | INTRAMUSCULAR | Status: AC
  Administered 2016-03-11: 40000 [IU] via SUBCUTANEOUS
  Filled 2016-03-11: qty 1

## 2016-03-12 ENCOUNTER — Inpatient Hospital Stay: Payer: Medicare Other

## 2016-03-12 VITALS — BP 121/61 | HR 57 | Temp 98.5°F | Resp 16

## 2016-03-12 DIAGNOSIS — D631 Anemia in chronic kidney disease: Secondary | ICD-10-CM

## 2016-03-12 DIAGNOSIS — N189 Chronic kidney disease, unspecified: Principal | ICD-10-CM

## 2016-03-12 DIAGNOSIS — I129 Hypertensive chronic kidney disease with stage 1 through stage 4 chronic kidney disease, or unspecified chronic kidney disease: Secondary | ICD-10-CM | POA: Diagnosis not present

## 2016-03-12 MED ORDER — HEPARIN SOD (PORK) LOCK FLUSH 100 UNIT/ML IV SOLN
500.0000 [IU] | Freq: Once | INTRAVENOUS | Status: AC
Start: 2016-03-12 — End: 2016-03-12
  Administered 2016-03-12: 500 [IU] via INTRAVENOUS
  Filled 2016-03-12: qty 5

## 2016-03-12 MED ORDER — SODIUM CHLORIDE 0.9% FLUSH
10.0000 mL | Freq: Once | INTRAVENOUS | Status: AC
Start: 2016-03-12 — End: 2016-03-12
  Administered 2016-03-12: 10 mL via INTRAVENOUS
  Filled 2016-03-12: qty 10

## 2016-03-12 MED ORDER — SODIUM CHLORIDE 0.9 % IV SOLN
INTRAVENOUS | Status: DC
Start: 2016-03-12 — End: 2016-03-12
  Administered 2016-03-12: 14:00:00 via INTRAVENOUS
  Filled 2016-03-12: qty 1000

## 2016-03-12 MED ORDER — SODIUM CHLORIDE 0.9 % IV SOLN
510.0000 mg | Freq: Once | INTRAVENOUS | Status: AC
  Administered 2016-03-12: 510 mg via INTRAVENOUS
  Filled 2016-03-12: qty 17

## 2016-03-13 ENCOUNTER — Inpatient Hospital Stay: Payer: Medicare Other

## 2016-03-13 VITALS — BP 146/65 | HR 58 | Resp 18

## 2016-03-13 DIAGNOSIS — N189 Chronic kidney disease, unspecified: Principal | ICD-10-CM

## 2016-03-13 DIAGNOSIS — D571 Sickle-cell disease without crisis: Secondary | ICD-10-CM

## 2016-03-13 DIAGNOSIS — D631 Anemia in chronic kidney disease: Secondary | ICD-10-CM

## 2016-03-13 DIAGNOSIS — I129 Hypertensive chronic kidney disease with stage 1 through stage 4 chronic kidney disease, or unspecified chronic kidney disease: Secondary | ICD-10-CM | POA: Diagnosis not present

## 2016-03-13 LAB — HEMOGLOBIN: HEMOGLOBIN: 6.2 g/dL — AB (ref 12.0–16.0)

## 2016-03-13 LAB — SAMPLE TO BLOOD BANK

## 2016-03-13 LAB — HEMATOCRIT: HEMATOCRIT: 17.4 % — AB (ref 35.0–47.0)

## 2016-03-13 MED ORDER — EPOETIN ALFA 40000 UNIT/ML IJ SOLN
40000.0000 [IU] | Freq: Once | INTRAMUSCULAR | Status: AC
  Administered 2016-03-13: 40000 [IU] via SUBCUTANEOUS
  Filled 2016-03-13: qty 1

## 2016-03-16 ENCOUNTER — Other Ambulatory Visit: Payer: Self-pay

## 2016-03-16 ENCOUNTER — Inpatient Hospital Stay: Payer: Medicare Other

## 2016-03-16 ENCOUNTER — Ambulatory Visit (INDEPENDENT_AMBULATORY_CARE_PROVIDER_SITE_OTHER): Payer: Medicare Other

## 2016-03-16 VITALS — BP 127/66 | HR 67 | Resp 18

## 2016-03-16 DIAGNOSIS — D571 Sickle-cell disease without crisis: Secondary | ICD-10-CM

## 2016-03-16 DIAGNOSIS — N189 Chronic kidney disease, unspecified: Principal | ICD-10-CM

## 2016-03-16 DIAGNOSIS — Z23 Encounter for immunization: Secondary | ICD-10-CM | POA: Diagnosis not present

## 2016-03-16 DIAGNOSIS — D631 Anemia in chronic kidney disease: Secondary | ICD-10-CM

## 2016-03-16 DIAGNOSIS — I129 Hypertensive chronic kidney disease with stage 1 through stage 4 chronic kidney disease, or unspecified chronic kidney disease: Secondary | ICD-10-CM | POA: Diagnosis not present

## 2016-03-16 LAB — SAMPLE TO BLOOD BANK

## 2016-03-16 LAB — HEMATOCRIT: HEMATOCRIT: 17.7 % — AB (ref 35.0–47.0)

## 2016-03-16 LAB — HEMOGLOBIN: HEMOGLOBIN: 6.4 g/dL — AB (ref 12.0–16.0)

## 2016-03-16 MED ORDER — EPOETIN ALFA 40000 UNIT/ML IJ SOLN
40000.0000 [IU] | Freq: Once | INTRAMUSCULAR | Status: AC
  Administered 2016-03-16: 40000 [IU] via SUBCUTANEOUS
  Filled 2016-03-16: qty 1

## 2016-03-16 NOTE — Progress Notes (Signed)
Received flu shot

## 2016-03-18 ENCOUNTER — Inpatient Hospital Stay: Payer: Medicare Other

## 2016-03-18 VITALS — BP 127/64 | HR 67 | Resp 18

## 2016-03-18 DIAGNOSIS — D571 Sickle-cell disease without crisis: Secondary | ICD-10-CM

## 2016-03-18 DIAGNOSIS — N189 Chronic kidney disease, unspecified: Principal | ICD-10-CM

## 2016-03-18 DIAGNOSIS — I129 Hypertensive chronic kidney disease with stage 1 through stage 4 chronic kidney disease, or unspecified chronic kidney disease: Secondary | ICD-10-CM | POA: Diagnosis not present

## 2016-03-18 DIAGNOSIS — D631 Anemia in chronic kidney disease: Secondary | ICD-10-CM

## 2016-03-18 LAB — HEMATOCRIT: HCT: 18.2 % — ABNORMAL LOW (ref 35.0–47.0)

## 2016-03-18 LAB — SAMPLE TO BLOOD BANK

## 2016-03-18 LAB — HEMOGLOBIN: HEMOGLOBIN: 6.3 g/dL — AB (ref 12.0–16.0)

## 2016-03-18 MED ORDER — EPOETIN ALFA 40000 UNIT/ML IJ SOLN
40000.0000 [IU] | Freq: Once | INTRAMUSCULAR | Status: AC
  Administered 2016-03-18: 40000 [IU] via SUBCUTANEOUS
  Filled 2016-03-18: qty 1

## 2016-03-19 ENCOUNTER — Inpatient Hospital Stay: Payer: Medicare Other

## 2016-03-19 VITALS — BP 120/58 | HR 58 | Temp 98.0°F | Resp 18

## 2016-03-19 DIAGNOSIS — I129 Hypertensive chronic kidney disease with stage 1 through stage 4 chronic kidney disease, or unspecified chronic kidney disease: Secondary | ICD-10-CM | POA: Diagnosis not present

## 2016-03-19 DIAGNOSIS — N189 Chronic kidney disease, unspecified: Principal | ICD-10-CM

## 2016-03-19 DIAGNOSIS — D631 Anemia in chronic kidney disease: Secondary | ICD-10-CM

## 2016-03-19 MED ORDER — SODIUM CHLORIDE 0.9% FLUSH
10.0000 mL | INTRAVENOUS | Status: DC | PRN
  Administered 2016-03-19: 10 mL via INTRAVENOUS
  Filled 2016-03-19: qty 10

## 2016-03-19 MED ORDER — FERUMOXYTOL INJECTION 510 MG/17 ML
510.0000 mg | Freq: Once | INTRAVENOUS | Status: AC
  Administered 2016-03-19: 510 mg via INTRAVENOUS
  Filled 2016-03-19: qty 17

## 2016-03-19 MED ORDER — HEPARIN SOD (PORK) LOCK FLUSH 100 UNIT/ML IV SOLN
500.0000 [IU] | Freq: Once | INTRAVENOUS | Status: AC
  Administered 2016-03-19: 500 [IU] via INTRAVENOUS
  Filled 2016-03-19: qty 5

## 2016-03-19 MED ORDER — SODIUM CHLORIDE 0.9 % IV SOLN
Freq: Once | INTRAVENOUS | Status: AC
  Administered 2016-03-19: 14:00:00 via INTRAVENOUS
  Filled 2016-03-19: qty 1000

## 2016-03-20 ENCOUNTER — Inpatient Hospital Stay: Payer: Medicare Other

## 2016-03-20 ENCOUNTER — Other Ambulatory Visit (INDEPENDENT_AMBULATORY_CARE_PROVIDER_SITE_OTHER): Payer: Self-pay | Admitting: Vascular Surgery

## 2016-03-20 VITALS — BP 128/61 | HR 51 | Resp 18

## 2016-03-20 DIAGNOSIS — D571 Sickle-cell disease without crisis: Secondary | ICD-10-CM

## 2016-03-20 DIAGNOSIS — I129 Hypertensive chronic kidney disease with stage 1 through stage 4 chronic kidney disease, or unspecified chronic kidney disease: Secondary | ICD-10-CM | POA: Diagnosis not present

## 2016-03-20 DIAGNOSIS — D631 Anemia in chronic kidney disease: Secondary | ICD-10-CM

## 2016-03-20 DIAGNOSIS — N189 Chronic kidney disease, unspecified: Principal | ICD-10-CM

## 2016-03-20 DIAGNOSIS — N186 End stage renal disease: Secondary | ICD-10-CM

## 2016-03-20 LAB — SAMPLE TO BLOOD BANK

## 2016-03-20 LAB — HEMOGLOBIN: HEMOGLOBIN: 5.8 g/dL — AB (ref 12.0–16.0)

## 2016-03-20 LAB — HEMATOCRIT: HCT: 16.8 % — ABNORMAL LOW (ref 35.0–47.0)

## 2016-03-20 MED ORDER — EPOETIN ALFA 40000 UNIT/ML IJ SOLN
40000.0000 [IU] | Freq: Once | INTRAMUSCULAR | Status: AC
  Administered 2016-03-20: 40000 [IU] via SUBCUTANEOUS
  Filled 2016-03-20: qty 1

## 2016-03-23 ENCOUNTER — Encounter (INDEPENDENT_AMBULATORY_CARE_PROVIDER_SITE_OTHER): Payer: Self-pay | Admitting: Vascular Surgery

## 2016-03-23 ENCOUNTER — Encounter (INDEPENDENT_AMBULATORY_CARE_PROVIDER_SITE_OTHER): Payer: Self-pay

## 2016-03-23 ENCOUNTER — Ambulatory Visit (INDEPENDENT_AMBULATORY_CARE_PROVIDER_SITE_OTHER): Payer: Medicare Other | Admitting: Vascular Surgery

## 2016-03-23 ENCOUNTER — Ambulatory Visit (INDEPENDENT_AMBULATORY_CARE_PROVIDER_SITE_OTHER): Payer: Medicare Other

## 2016-03-23 ENCOUNTER — Inpatient Hospital Stay: Payer: Medicare Other

## 2016-03-23 ENCOUNTER — Other Ambulatory Visit (INDEPENDENT_AMBULATORY_CARE_PROVIDER_SITE_OTHER): Payer: Self-pay | Admitting: Vascular Surgery

## 2016-03-23 VITALS — BP 124/52 | HR 63 | Resp 16 | Ht 67.0 in | Wt 193.0 lb

## 2016-03-23 VITALS — BP 133/62 | HR 61

## 2016-03-23 DIAGNOSIS — I129 Hypertensive chronic kidney disease with stage 1 through stage 4 chronic kidney disease, or unspecified chronic kidney disease: Secondary | ICD-10-CM | POA: Diagnosis not present

## 2016-03-23 DIAGNOSIS — D631 Anemia in chronic kidney disease: Secondary | ICD-10-CM

## 2016-03-23 DIAGNOSIS — N184 Chronic kidney disease, stage 4 (severe): Secondary | ICD-10-CM

## 2016-03-23 DIAGNOSIS — N189 Chronic kidney disease, unspecified: Principal | ICD-10-CM

## 2016-03-23 DIAGNOSIS — N186 End stage renal disease: Secondary | ICD-10-CM | POA: Diagnosis not present

## 2016-03-23 DIAGNOSIS — R601 Generalized edema: Secondary | ICD-10-CM

## 2016-03-23 DIAGNOSIS — T829XXA Unspecified complication of cardiac and vascular prosthetic device, implant and graft, initial encounter: Secondary | ICD-10-CM

## 2016-03-23 DIAGNOSIS — D571 Sickle-cell disease without crisis: Secondary | ICD-10-CM

## 2016-03-23 LAB — SAMPLE TO BLOOD BANK

## 2016-03-23 LAB — HEMATOCRIT: HCT: 17.4 % — ABNORMAL LOW (ref 35.0–47.0)

## 2016-03-23 LAB — HEMOGLOBIN: Hemoglobin: 6.2 g/dL — ABNORMAL LOW (ref 12.0–16.0)

## 2016-03-23 MED ORDER — EPOETIN ALFA 40000 UNIT/ML IJ SOLN
40000.0000 [IU] | Freq: Once | INTRAMUSCULAR | Status: AC
  Administered 2016-03-23: 40000 [IU] via SUBCUTANEOUS
  Filled 2016-03-23: qty 1

## 2016-03-23 NOTE — Progress Notes (Signed)
Subjective:    Patient ID: Jane Short, female    DOB: Sep 07, 1950, 65 y.o.   MRN: [id number] Chief Complaint  Patient presents with  . Follow-up   Patient presents for her first post-operative follow up. She is s/p a left brachio-cephalic AV fistula creation on 02/27/16. With the exception of some left hand swelling which has slowly improved over the last few weeks the patients post-operative course has been uneventful. No issues with her incision - states it has healed well. The patient underwent a duplex ultrasound of the AV access which was notable for what seems like a obstructive thrombus with dilatation measuring 2.82cm x 1.38cm with minimal flow proximally. The patient denies any fistula skin breakdown, pain, worsening edema, pallor or ulceration of the arm / hand.    Review of Systems  Constitutional: Negative.   HENT: Negative.   Eyes: Negative.   Respiratory: Negative.   Cardiovascular: Positive for leg swelling.  Gastrointestinal: Negative.   Endocrine: Negative.   Genitourinary:       Chronic Kidney Disease  Musculoskeletal: Negative.   Skin: Negative.   Allergic/Immunologic: Negative.   Neurological: Negative.   Hematological: Negative.   Psychiatric/Behavioral: Negative.       Objective:   Physical Exam  Constitutional: She is oriented to person, place, and time. She appears well-developed and well-nourished.  HENT:  Head: Normocephalic and atraumatic.  Eyes: Conjunctivae and EOM are normal. Pupils are equal, round, and reactive to light.  Neck: Normal range of motion.  Cardiovascular: Normal rate, regular rhythm, normal heart sounds and intact distal pulses.   Pulses:      Radial pulses are 2+ on the right side, and 2+ on the left side.       Dorsalis pedis pulses are 1+ on the right side, and 1+ on the left side.       Posterior tibial pulses are 1+ on the right side, and 1+ on the left side.  Left Brachio-Cephalic AV Fistula: Incision has healed well. No  bruit or thrill noted. No ulceration. Mild edema of the left hand noted.   Pulmonary/Chest: Effort normal and breath sounds normal.  Abdominal: Soft. Bowel sounds are normal.  Musculoskeletal: Normal range of motion. She exhibits edema (Mild Bilateral).  Neurological: She is alert and oriented to person, place, and time.  Skin: Skin is warm and dry.  Psychiatric: She has a normal mood and affect. Her behavior is normal. Judgment and thought content normal.   BP (!) 124/52   Pulse 63   Resp 16   Ht 5\' 7"  (1.702 m)   Wt 193 lb (87.5 kg)   BMI 30.23 kg/m   Past Medical History:  Diagnosis Date  . Anemia   . 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 Cheree Ditto at [facility]  . CHF (congestive heart failure) ([place])   . Congestive heart failure (CHF) ([place])   . Congestive heart failure (CHF) ([place]) 06/2015  . COPD (chronic obstructive pulmonary disease) (HCC)    symptoms Dr. Patricia Pesa   . Gout   . Hypertension   . Lichen planus   . Osteoporosis 2011   osteopenia  . 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 Sickle cell anemia (HCC)    sickle cell disease  . Sickle cell anemia (HCC)   . Thrombocytopenia ([place])   . Vitamin D deficiency    Social History   Social History  . Marital status: Married    Spouse name: N/A  .  Number of children: 1  . Years of education: N/A   Occupational History  .  [employer] account executive   Social History Main Topics  . Smoking status: Former Smoker    Packs/day: 0.25    Years: 30.00    Types: Cigarettes    Quit date: 04/29/2015  . Smokeless tobacco: Never Used     Comment: quit 1 month ago  . Alcohol use No  . Drug use: No     Comment: reports using e-cigarettes occasionally  . Sexual activity: Not on file   Other Topics Concern  . Not on file   Social History Narrative   Regular Exercise -  NO   Daily Caffeine Use:  1 cup coffee   Lives with her sister.   Working as a Building control surveyor in [employer]           Past Surgical History:  Procedure Laterality Date  . AV FISTULA PLACEMENT Left 02/27/2016   Procedure: INSERTION OF ARTERIOVENOUS (AV) GORE-TEX GRAFT ARM ( FOREARM LOOP );  Surgeon: Algernon Huxley, MD;  Location: [facility] ORS;  Service: Vascular;  Laterality: Left;  . CHOLECYSTECTOMY    . COLONOSCOPY  2014   [facility]  . EYE SURGERY     cataract bilateral  . GALLBLADDER SURGERY    . PERIPHERAL VASCULAR CATHETERIZATION Left 06/14/2015   Procedure: Dialysis/Perma Catheter Insertion;  Surgeon: Katha Cabal, MD;  Location: [place] [facility];  Service: Cardiovascular;  Laterality: Left;  . PERIPHERAL VASCULAR CATHETERIZATION N/A 01/23/2016   Procedure: Glori Luis Cath Insertion;  Surgeon: Algernon Huxley, MD;  Location: [place] [facility];  Service: Cardiovascular;  Laterality: N/A;  . TUBAL LIGATION     Family History  Problem Relation Age of Onset  . Heart disease Father   . Diabetes Father   . Diabetes Sister   . Cancer Mother     breast cancer  . Cancer Maternal Aunt     Breast cancer   Allergies  Allergen Reactions  . Ramipril     cough  . Septra [Sulfamethoxazole-Trimethoprim]     Joint pain      Assessment & Plan:  Patient presents for her first post-operative follow up. She is s/p a left brachio-cephalic AV fistula creation on 02/27/16. With the exception of some left hand swelling which has slowly improved over the last few weeks the patients post-operative course has been uneventful. No issues with her incision - states it has healed well. The patient underwent a duplex ultrasound of the AV access which was notable for what seems like a obstructive thrombus with dilatation measuring 2.82cm x 1.38cm with minimal flow proximally. The patient denies any fistula skin breakdown, pain, worsening edema, pallor or ulceration of the arm / hand.   1. Anemia of chronic renal failure, stage 4 (severe) (HCC) - Stable Currently not on dialysis yet however does not have adequate access. Patient is  scheduled for a left upper extremity fistulogram.  2. Complication from renal dialysis device, initial encounter - New Patient with thrombosis of newly created fistula. Recommend left upper extremity fistulogram to assess fistula and possibly repair. Procedure, risks and benefits explained to patient. All questions answered. Patient wishes to proceed.   3. Generalized edema - Stable Encouraged compression and elevation.   Current Outpatient Prescriptions on File Prior to Visit  Medication Sig Dispense Refill  . albuterol (PROVENTIL HFA;VENTOLIN HFA) 108 (90 Base) MCG/ACT inhaler Inhale 2 puffs into the lungs  every 4 (four) hours as needed for wheezing or shortness of breath. 1 Inhaler 2  . amiodarone (PACERONE) 100 MG tablet Take 100 mg by mouth daily.    . calcitRIOL (ROCALTROL) 0.25 MCG capsule Take 0.25 mcg by mouth daily.    Marland Kitchen epoetin alfa (EPOGEN,PROCRIT) 57846 UNIT/ML injection Inject into the skin 3 (three) times a week.    . fluticasone furoate-vilanterol (BREO ELLIPTA) 200-25 MCG/INH AEPB Inhale 1 puff into the lungs daily. 60 each 5  . folic acid (FOLVITE) 1 MG tablet Take 1 mg by mouth daily.    . furosemide (LASIX) 80 MG tablet Take 1 tablet (80 mg total) by mouth 2 (two) times daily. (Patient taking differently: Take 80 mg by mouth 2 (two) times daily. ) 30 tablet 0  . HYDROcodone-acetaminophen (NORCO) 5-325 MG tablet Take 1 tablet by mouth every 6 (six) hours as needed for moderate pain. 30 tablet 0  . HYDROcodone-acetaminophen (NORCO/VICODIN) 5-325 MG tablet Take 1 tablet by mouth every 8 (eight) hours as needed for moderate pain. 30 tablet 0  . hydroxyurea (HYDREA) 500 MG capsule Take 1 capsule (500 mg total) by mouth daily. May take with food to minimize GI side effects. 60 capsule 3  . iron dextran complex in sodium chloride 0.9 % 500 mL Inject into the vein.    . OXYGEN Inhale into the lungs.    . sodium bicarbonate 650 MG tablet Take by mouth 2 (two) times daily.     Marland Kitchen  amiodarone (PACERONE) 200 MG tablet Take 200 mg by mouth daily.    . mometasone-formoterol (DULERA) 200-5 MCG/ACT AERO Inhale 2 puffs into the lungs 2 (two) times daily. (Patient not taking: Reported on 03/23/2016) 1 Inhaler 12   Current Facility-Administered Medications on File Prior to Visit  Medication Dose Route Frequency Provider Last Rate Last Dose  . 0.9 %  sodium chloride infusion  250 mL Intravenous Once Cammie Sickle, MD      . acetaminophen (TYLENOL) tablet 650 mg  650 mg Oral Once Cammie Sickle, MD      . acetaminophen (TYLENOL) tablet 650 mg  650 mg Oral Once Cammie Sickle, MD      . diphenhydrAMINE (BENADRYL) capsule 25 mg  25 mg Oral Once Cammie Sickle, MD      . diphenhydrAMINE (BENADRYL) capsule 25 mg  25 mg Oral Once Cammie Sickle, MD      . furosemide (LASIX) injection 20 mg  20 mg Intravenous Once Cammie Sickle, MD      . furosemide (LASIX) injection 20 mg  20 mg Intravenous Once Cammie Sickle, MD      . heparin lock flush 100 unit/mL  250 Units Intracatheter PRN Leia Alf, MD      . heparin lock flush 100 unit/mL  500 Units Intracatheter Daily PRN Cammie Sickle, MD      . heparin lock flush 100 unit/mL  250 Units Intracatheter PRN Cammie Sickle, MD      . heparin lock flush 100 unit/mL  500 Units Intracatheter Daily PRN Cammie Sickle, MD      . heparin lock flush 100 unit/mL  250 Units Intracatheter PRN Cammie Sickle, MD      . sodium chloride flush (NS) 0.9 % injection 10 mL  10 mL Intracatheter PRN Cammie Sickle, MD      . sodium chloride flush (NS) 0.9 % injection 10 mL  10 mL Intracatheter PRN Lenetta Quaker R  Rogue Bussing, MD      . sodium chloride flush (NS) 0.9 % injection 3 mL  3 mL Intracatheter PRN Cammie Sickle, MD      . sodium chloride flush (NS) 0.9 % injection 3 mL  3 mL Intracatheter PRN Cammie Sickle, MD        There are no Patient Instructions on file for this  visit. No Follow-up on file.   Zo Loudon A Tyshell Ramberg, PA-C

## 2016-03-25 ENCOUNTER — Inpatient Hospital Stay: Payer: Medicare Other

## 2016-03-25 ENCOUNTER — Other Ambulatory Visit: Payer: Self-pay

## 2016-03-25 VITALS — BP 126/61 | HR 64 | Resp 18

## 2016-03-25 DIAGNOSIS — D631 Anemia in chronic kidney disease: Secondary | ICD-10-CM

## 2016-03-25 DIAGNOSIS — I129 Hypertensive chronic kidney disease with stage 1 through stage 4 chronic kidney disease, or unspecified chronic kidney disease: Secondary | ICD-10-CM | POA: Diagnosis not present

## 2016-03-25 DIAGNOSIS — D571 Sickle-cell disease without crisis: Secondary | ICD-10-CM

## 2016-03-25 DIAGNOSIS — N189 Chronic kidney disease, unspecified: Principal | ICD-10-CM

## 2016-03-25 LAB — HEMATOCRIT: HCT: 17.7 % — ABNORMAL LOW (ref 35.0–47.0)

## 2016-03-25 LAB — SAMPLE TO BLOOD BANK

## 2016-03-25 LAB — HEMOGLOBIN: Hemoglobin: 6.3 g/dL — ABNORMAL LOW (ref 12.0–16.0)

## 2016-03-25 MED ORDER — EPOETIN ALFA 40000 UNIT/ML IJ SOLN
40000.0000 [IU] | Freq: Once | INTRAMUSCULAR | Status: AC
  Administered 2016-03-25: 40000 [IU] via SUBCUTANEOUS
  Filled 2016-03-25: qty 1

## 2016-03-27 ENCOUNTER — Inpatient Hospital Stay: Payer: Medicare Other

## 2016-03-29 MED ORDER — DEXTROSE 5 % IV SOLN: 1.5000 g | INTRAVENOUS | Status: DC

## 2016-03-30 ENCOUNTER — Ambulatory Visit
Admission: RE | Admit: 2016-03-30 | Discharge: 2016-03-30 | Disposition: A | Discharge status: Home / Self Care | Payer: Medicare Other | Source: Ambulatory Visit | Attending: Vascular Surgery | Admitting: Vascular Surgery

## 2016-03-30 ENCOUNTER — Encounter
Admission: RE | Disposition: A | Discharge status: Home / Self Care | Payer: Self-pay | Source: Ambulatory Visit | Attending: Vascular Surgery

## 2016-03-30 DIAGNOSIS — M109 Gout, unspecified: Secondary | ICD-10-CM | POA: Insufficient documentation

## 2016-03-30 DIAGNOSIS — I509 Heart failure, unspecified: Secondary | ICD-10-CM | POA: Diagnosis not present

## 2016-03-30 DIAGNOSIS — M810 Age-related osteoporosis without current pathological fracture: Secondary | ICD-10-CM | POA: Insufficient documentation

## 2016-03-30 DIAGNOSIS — Z9049 Acquired absence of other specified parts of digestive tract: Secondary | ICD-10-CM | POA: Diagnosis not present

## 2016-03-30 DIAGNOSIS — J449 Chronic obstructive pulmonary disease, unspecified: Secondary | ICD-10-CM | POA: Diagnosis not present

## 2016-03-30 DIAGNOSIS — D151 Benign neoplasm of heart: Secondary | ICD-10-CM | POA: Diagnosis not present

## 2016-03-30 DIAGNOSIS — Z9851 Tubal ligation status: Secondary | ICD-10-CM | POA: Insufficient documentation

## 2016-03-30 DIAGNOSIS — Y832 Surgical operation with anastomosis, bypass or graft as the cause of abnormal reaction of the patient, or of later complication, without mention of misadventure at the time of the procedure: Secondary | ICD-10-CM | POA: Diagnosis not present

## 2016-03-30 DIAGNOSIS — Z833 Family history of diabetes mellitus: Secondary | ICD-10-CM | POA: Diagnosis not present

## 2016-03-30 DIAGNOSIS — I132 Hypertensive heart and chronic kidney disease with heart failure and with stage 5 chronic kidney disease, or end stage renal disease: Secondary | ICD-10-CM | POA: Diagnosis not present

## 2016-03-30 DIAGNOSIS — Z803 Family history of malignant neoplasm of breast: Secondary | ICD-10-CM | POA: Insufficient documentation

## 2016-03-30 DIAGNOSIS — E559 Vitamin D deficiency, unspecified: Secondary | ICD-10-CM | POA: Insufficient documentation

## 2016-03-30 DIAGNOSIS — L439 Lichen planus, unspecified: Secondary | ICD-10-CM | POA: Insufficient documentation

## 2016-03-30 DIAGNOSIS — D571 Sickle-cell disease without crisis: Secondary | ICD-10-CM | POA: Insufficient documentation

## 2016-03-30 DIAGNOSIS — Z8249 Family history of ischemic heart disease and other diseases of the circulatory system: Secondary | ICD-10-CM | POA: Diagnosis not present

## 2016-03-30 DIAGNOSIS — Z882 Allergy status to sulfonamides status: Secondary | ICD-10-CM | POA: Insufficient documentation

## 2016-03-30 DIAGNOSIS — D696 Thrombocytopenia, unspecified: Secondary | ICD-10-CM | POA: Insufficient documentation

## 2016-03-30 DIAGNOSIS — Z992 Dependence on renal dialysis: Secondary | ICD-10-CM | POA: Diagnosis not present

## 2016-03-30 DIAGNOSIS — Z888 Allergy status to other drugs, medicaments and biological substances status: Secondary | ICD-10-CM | POA: Diagnosis not present

## 2016-03-30 DIAGNOSIS — Z87891 Personal history of nicotine dependence: Secondary | ICD-10-CM | POA: Diagnosis not present

## 2016-03-30 DIAGNOSIS — G118 Other hereditary ataxias: Secondary | ICD-10-CM | POA: Insufficient documentation

## 2016-03-30 DIAGNOSIS — N186 End stage renal disease: Secondary | ICD-10-CM | POA: Insufficient documentation

## 2016-03-30 DIAGNOSIS — T82590A Other mechanical complication of surgically created arteriovenous fistula, initial encounter: Secondary | ICD-10-CM | POA: Insufficient documentation

## 2016-03-30 HISTORY — PX: PERIPHERAL VASCULAR CATHETERIZATION: SHX172C

## 2016-03-30 LAB — POTASSIUM (ARMC VASCULAR LAB ONLY): Potassium (ARMC vascular lab): 5.4 — ABNORMAL HIGH (ref 3.5–5.1)

## 2016-03-30 SURGERY — A/V SHUNTOGRAM/FISTULAGRAM
Anesthesia: Moderate Sedation | Site: Arm Upper | Laterality: Left

## 2016-03-30 MED ORDER — HYDROMORPHONE HCL 1 MG/ML IJ SOLN: 1.0000 mg | Freq: Once | INTRAMUSCULAR | Status: DC

## 2016-03-30 MED ORDER — MIDAZOLAM HCL 2 MG/2ML IJ SOLN
INTRAMUSCULAR | Status: AC
  Filled 2016-03-30: qty 2

## 2016-03-30 MED ORDER — FENTANYL CITRATE (PF) 100 MCG/2ML IJ SOLN
INTRAMUSCULAR | Status: AC
  Filled 2016-03-30: qty 2

## 2016-03-30 MED ORDER — ONDANSETRON HCL 4 MG/2ML IJ SOLN
4.0000 mg | Freq: Four times a day (QID) | INTRAMUSCULAR | Status: DC | PRN
Start: 2016-03-30 — End: 2016-03-30

## 2016-03-30 MED ORDER — IOPAMIDOL (ISOVUE-300) INJECTION 61%
INTRAVENOUS | Status: DC | PRN
  Administered 2016-03-30: 5 mL via INTRAVENOUS

## 2016-03-30 MED ORDER — METHYLPREDNISOLONE SODIUM SUCC 125 MG IJ SOLR: 125.0000 mg | INTRAMUSCULAR | Status: DC | PRN

## 2016-03-30 MED ORDER — SODIUM CHLORIDE 0.9 % IV SOLN: INTRAVENOUS | Status: DC

## 2016-03-30 MED ORDER — LIDOCAINE-EPINEPHRINE (PF) 1 %-1:200000 IJ SOLN
INTRAMUSCULAR | Status: AC
  Filled 2016-03-30: qty 30

## 2016-03-30 MED ORDER — MIDAZOLAM HCL 2 MG/2ML IJ SOLN
INTRAMUSCULAR | Status: DC | PRN
  Administered 2016-03-30: 2 mg via INTRAVENOUS

## 2016-03-30 MED ORDER — HEPARIN SODIUM (PORCINE) 1000 UNIT/ML IJ SOLN
INTRAMUSCULAR | Status: AC
  Filled 2016-03-30: qty 1

## 2016-03-30 MED ORDER — FAMOTIDINE 20 MG PO TABS: 40.0000 mg | ORAL_TABLET | ORAL | Status: DC | PRN

## 2016-03-30 MED ORDER — FENTANYL CITRATE (PF) 100 MCG/2ML IJ SOLN
INTRAMUSCULAR | Status: DC | PRN
  Administered 2016-03-30: 50 ug via INTRAVENOUS

## 2016-03-30 SURGICAL SUPPLY — 5 items
CANNULA 5F STIFF (CANNULA) ×2 IMPLANT
DRAPE BRACHIAL (DRAPES) ×2 IMPLANT
PACK ANGIOGRAPHY (CUSTOM PROCEDURE TRAY) ×2 IMPLANT
SHEATH BRITE TIP 6FRX5.5 (SHEATH) ×2 IMPLANT
TOWEL OR 17X26 4PK STRL BLUE (TOWEL DISPOSABLE) ×2 IMPLANT

## 2016-03-30 NOTE — H&P (Signed)
[facility] History & Physical Update  The patient was interviewed and re-examined.  The patient's previous History and Physical has been reviewed and is unchanged.  There is no change in the plan of care. We plan to proceed with the scheduled procedure.  Leotis Pain, MD  03/30/2016, 8:04 AM

## 2016-03-30 NOTE — Discharge Instructions (Signed)
Fistulogram, Care After °Refer to this sheet in the next few weeks. These instructions provide you with information on caring for yourself after your procedure. Your health care provider may also give you more specific instructions. Your treatment has been planned according to current medical practices, but problems sometimes occur. Call your health care provider if you have any problems or questions after your procedure. °WHAT TO EXPECT AFTER THE PROCEDURE °After your procedure, it is typical to have the following: °· A small amount of discomfort in the area where the catheters were placed. °· A small amount of bruising around the fistula. °· Sleepiness and fatigue. °HOME CARE INSTRUCTIONS °· Rest at home for the day following your procedure. °· Do not drive or operate heavy machinery while taking pain medicine. °· Take medicines only as directed by your health care provider. °· Do not take baths, swim, or use a hot tub until your health care provider approves. You may shower 24 hours after the procedure or as directed by your health care provider. °· There are many different ways to close and cover an incision, including stitches, skin glue, and adhesive strips. Follow your health care provider's instructions on: °¨ Incision care. °¨ Bandage (dressing) changes and removal. °¨ Incision closure removal. °· Monitor your dialysis fistula carefully. °SEEK MEDICAL CARE IF: °· You have drainage, redness, swelling, or pain at your catheter site. °· You have a fever. °· You have chills. °SEEK IMMEDIATE MEDICAL CARE IF: °· You feel weak. °· You have trouble balancing. °· You have trouble moving your arms or legs. °· You have problems with your speech or vision. °· You can no longer feel a vibration or buzz when you put your fingers over your dialysis fistula. °· The limb that was used for the procedure: °¨ Swells. °¨ Is painful. °¨ Is cold. °¨ Is discolored, such as blue or pale white. °  °This information is not intended  to replace advice given to you by your health care provider. Make sure you discuss any questions you have with your health care provider. °  °Document Released: 10/02/2013 Document Reviewed: 10/02/2013 °Elsevier Interactive Patient Education ©2016 Elsevier Inc. ° °

## 2016-03-30 NOTE — Op Note (Signed)
[facility]  OPERATIVE NOTE   PROCEDURE: 1.   Left brachiocephalic arteriovenous fistula cannulation under ultrasound guidance 2.   Left arm fistulagram   PRE-OPERATIVE DIAGNOSIS: 1. ESRD 2. Non-maturing left arm brachiocephalic AV fistula  POST-OPERATIVE DIAGNOSIS: same as above   SURGEON: Leotis Pain, MD  ANESTHESIA: local with MCS  ESTIMATED BLOOD LOSS: Minimal  FINDING(S): 1. Atretic cephalic vein with only venous flow that was clearly not viable as an AV fistula.  SPECIMEN(S):  None  CONTRAST: 5 cc  FLUORO TIME: Less than 1 minutes  MODERATE CONSCIOUS SEDATION TIME: Approximately 15 minutes with 2 mg of Versed and 50 mcg of Fentanyl   INDICATIONS: Jane Short is a 65 y.o. female who presents with malfunctioning  poorly maturing left brachiocephalic arteriovenous fistula.  The patient is scheduled for  left arm fistulagram.  The patient is aware the risks include but are not limited to: bleeding, infection, thrombosis of the cannulated access, and possible anaphylactic reaction to the contrast.  The patient is aware of the risks of the procedure and elects to proceed forward.  DESCRIPTION: After full informed written consent was obtained, the patient was brought back to the angiography suite and placed supine upon the angiography table.  The patient was connected to monitoring equipment. Moderate conscious sedation was administered with a face to face encounter with the patient throughout the procedure with my supervision of the RN administering medicines and monitoring the patient's vital signs and mental status throughout from the start of the procedure until the patient was taken to the recovery room. The  left arm was prepped and draped in the standard fashion for a percutaneous access intervention.  Under ultrasound guidance, the  left brachiocephalic arteriovenous fistula was cannulated with a micropuncture needle under direct ultrasound guidance  in a retrograde fashion in the upper arm and a permanent image was performed. The vein was small and easily compressible similar to a native vein and not a fistula area  The microwire was advanced into the fistula and the needle was exchanged for the a microsheath. Imaging was performed through the micropuncture sheath. This demonstrated an atretic cephalic vein with only venous flow that was clearly not viable as an AV fistula.  Based on the completion imaging, no intervention was likely to salvage this fistula and I did not feel it would be a viable access.  The sheath was removed.  A sterile bandage was applied to the puncture site.  COMPLICATIONS: None  CONDITION: Stable   Leotis Pain  03/30/2016 9:44 AM   This note was created with Dragon Medical transcription system. Any errors in dictation are purely unintentional.

## 2016-03-31 ENCOUNTER — Encounter: Payer: Self-pay | Admitting: Vascular Surgery

## 2016-04-01 ENCOUNTER — Inpatient Hospital Stay: Payer: Medicare Other

## 2016-04-01 ENCOUNTER — Inpatient Hospital Stay: Payer: Medicare Other | Attending: Internal Medicine

## 2016-04-01 VITALS — BP 147/72 | HR 59 | Temp 97.5°F | Resp 20

## 2016-04-01 DIAGNOSIS — J449 Chronic obstructive pulmonary disease, unspecified: Secondary | ICD-10-CM | POA: Diagnosis not present

## 2016-04-01 DIAGNOSIS — N189 Chronic kidney disease, unspecified: Principal | ICD-10-CM

## 2016-04-01 DIAGNOSIS — D631 Anemia in chronic kidney disease: Secondary | ICD-10-CM | POA: Diagnosis not present

## 2016-04-01 DIAGNOSIS — I10 Essential (primary) hypertension: Secondary | ICD-10-CM | POA: Diagnosis not present

## 2016-04-01 DIAGNOSIS — G4730 Sleep apnea, unspecified: Secondary | ICD-10-CM | POA: Diagnosis not present

## 2016-04-01 DIAGNOSIS — L439 Lichen planus, unspecified: Secondary | ICD-10-CM | POA: Insufficient documentation

## 2016-04-01 DIAGNOSIS — N186 End stage renal disease: Secondary | ICD-10-CM | POA: Diagnosis not present

## 2016-04-01 DIAGNOSIS — D696 Thrombocytopenia, unspecified: Secondary | ICD-10-CM | POA: Insufficient documentation

## 2016-04-01 DIAGNOSIS — I129 Hypertensive chronic kidney disease with stage 1 through stage 4 chronic kidney disease, or unspecified chronic kidney disease: Secondary | ICD-10-CM | POA: Insufficient documentation

## 2016-04-01 DIAGNOSIS — Z803 Family history of malignant neoplasm of breast: Secondary | ICD-10-CM | POA: Diagnosis not present

## 2016-04-01 DIAGNOSIS — E875 Hyperkalemia: Secondary | ICD-10-CM | POA: Insufficient documentation

## 2016-04-01 DIAGNOSIS — D571 Sickle-cell disease without crisis: Secondary | ICD-10-CM

## 2016-04-01 DIAGNOSIS — M109 Gout, unspecified: Secondary | ICD-10-CM | POA: Insufficient documentation

## 2016-04-01 DIAGNOSIS — E559 Vitamin D deficiency, unspecified: Secondary | ICD-10-CM | POA: Insufficient documentation

## 2016-04-01 DIAGNOSIS — R948 Abnormal results of function studies of other organs and systems: Secondary | ICD-10-CM | POA: Diagnosis not present

## 2016-04-01 DIAGNOSIS — I4891 Unspecified atrial fibrillation: Secondary | ICD-10-CM | POA: Diagnosis not present

## 2016-04-01 DIAGNOSIS — R5383 Other fatigue: Secondary | ICD-10-CM | POA: Insufficient documentation

## 2016-04-01 DIAGNOSIS — Z79899 Other long term (current) drug therapy: Secondary | ICD-10-CM | POA: Diagnosis not present

## 2016-04-01 DIAGNOSIS — I509 Heart failure, unspecified: Secondary | ICD-10-CM | POA: Insufficient documentation

## 2016-04-01 LAB — HEMOGLOBIN: HEMOGLOBIN: 6 g/dL — AB (ref 12.0–16.0)

## 2016-04-01 LAB — SAMPLE TO BLOOD BANK

## 2016-04-01 LAB — HEMATOCRIT: HCT: 16.4 % — ABNORMAL LOW (ref 35.0–47.0)

## 2016-04-01 MED ORDER — EPOETIN ALFA 40000 UNIT/ML IJ SOLN
40000.0000 [IU] | Freq: Once | INTRAMUSCULAR | Status: AC
  Administered 2016-04-01: 40000 [IU] via SUBCUTANEOUS
  Filled 2016-04-01: qty 1

## 2016-04-03 ENCOUNTER — Inpatient Hospital Stay: Payer: Medicare Other

## 2016-04-03 VITALS — BP 125/60 | HR 58

## 2016-04-03 DIAGNOSIS — D571 Sickle-cell disease without crisis: Secondary | ICD-10-CM

## 2016-04-03 DIAGNOSIS — D631 Anemia in chronic kidney disease: Secondary | ICD-10-CM

## 2016-04-03 DIAGNOSIS — I129 Hypertensive chronic kidney disease with stage 1 through stage 4 chronic kidney disease, or unspecified chronic kidney disease: Secondary | ICD-10-CM | POA: Diagnosis not present

## 2016-04-03 DIAGNOSIS — N189 Chronic kidney disease, unspecified: Principal | ICD-10-CM

## 2016-04-03 MED ORDER — EPOETIN ALFA 40000 UNIT/ML IJ SOLN
40000.0000 [IU] | Freq: Once | INTRAMUSCULAR | Status: AC
  Administered 2016-04-03: 40000 [IU] via SUBCUTANEOUS
  Filled 2016-04-03: qty 1

## 2016-04-03 NOTE — Progress Notes (Signed)
Patient's labs were drawn 1 hr 15 min ago and the lab is having difficulty getting the results due to blood "crunchy".  I have called Dr. Rogue Bussing and he gives verbal order to proceed with giving Procrit injection without lab results today.

## 2016-04-06 ENCOUNTER — Inpatient Hospital Stay: Payer: Medicare Other

## 2016-04-06 VITALS — BP 117/59 | HR 52

## 2016-04-06 DIAGNOSIS — D571 Sickle-cell disease without crisis: Secondary | ICD-10-CM

## 2016-04-06 DIAGNOSIS — N189 Chronic kidney disease, unspecified: Principal | ICD-10-CM

## 2016-04-06 DIAGNOSIS — D631 Anemia in chronic kidney disease: Secondary | ICD-10-CM

## 2016-04-06 DIAGNOSIS — I129 Hypertensive chronic kidney disease with stage 1 through stage 4 chronic kidney disease, or unspecified chronic kidney disease: Secondary | ICD-10-CM | POA: Diagnosis not present

## 2016-04-06 LAB — HEMOGLOBIN: HEMOGLOBIN: 5.9 g/dL — AB (ref 12.0–16.0)

## 2016-04-06 LAB — SAMPLE TO BLOOD BANK

## 2016-04-06 LAB — HEMATOCRIT: HCT: 15.9 % — ABNORMAL LOW (ref 35.0–47.0)

## 2016-04-06 MED ORDER — EPOETIN ALFA 40000 UNIT/ML IJ SOLN
40000.0000 [IU] | Freq: Once | INTRAMUSCULAR | Status: AC
  Administered 2016-04-06: 40000 [IU] via SUBCUTANEOUS
  Filled 2016-04-06: qty 1

## 2016-04-08 ENCOUNTER — Inpatient Hospital Stay: Payer: Medicare Other

## 2016-04-08 VITALS — BP 136/71 | HR 65 | Temp 97.9°F | Resp 18

## 2016-04-08 DIAGNOSIS — D571 Sickle-cell disease without crisis: Secondary | ICD-10-CM

## 2016-04-08 DIAGNOSIS — N189 Chronic kidney disease, unspecified: Principal | ICD-10-CM

## 2016-04-08 DIAGNOSIS — D631 Anemia in chronic kidney disease: Secondary | ICD-10-CM

## 2016-04-08 DIAGNOSIS — I129 Hypertensive chronic kidney disease with stage 1 through stage 4 chronic kidney disease, or unspecified chronic kidney disease: Secondary | ICD-10-CM | POA: Diagnosis not present

## 2016-04-08 LAB — HEMATOCRIT: HEMATOCRIT: 16.4 % — AB (ref 35.0–47.0)

## 2016-04-08 LAB — SAMPLE TO BLOOD BANK

## 2016-04-08 LAB — HEMOGLOBIN: HEMOGLOBIN: 5.8 g/dL — AB (ref 12.0–16.0)

## 2016-04-08 MED ORDER — EPOETIN ALFA 40000 UNIT/ML IJ SOLN
40000.0000 [IU] | Freq: Once | INTRAMUSCULAR | Status: AC
  Administered 2016-04-08: 40000 [IU] via SUBCUTANEOUS
  Filled 2016-04-08: qty 1

## 2016-04-10 ENCOUNTER — Inpatient Hospital Stay: Payer: Medicare Other

## 2016-04-10 VITALS — BP 126/58 | HR 64

## 2016-04-10 DIAGNOSIS — D631 Anemia in chronic kidney disease: Secondary | ICD-10-CM

## 2016-04-10 DIAGNOSIS — N189 Chronic kidney disease, unspecified: Principal | ICD-10-CM

## 2016-04-10 DIAGNOSIS — D571 Sickle-cell disease without crisis: Secondary | ICD-10-CM

## 2016-04-10 DIAGNOSIS — I129 Hypertensive chronic kidney disease with stage 1 through stage 4 chronic kidney disease, or unspecified chronic kidney disease: Secondary | ICD-10-CM | POA: Diagnosis not present

## 2016-04-10 LAB — HEMOGLOBIN: Hemoglobin: 5.9 g/dL — ABNORMAL LOW (ref 12.0–16.0)

## 2016-04-10 LAB — SAMPLE TO BLOOD BANK

## 2016-04-10 LAB — HEMATOCRIT: HEMATOCRIT: 16.5 % — AB (ref 35.0–47.0)

## 2016-04-10 MED ORDER — EPOETIN ALFA 40000 UNIT/ML IJ SOLN
40000.0000 [IU] | Freq: Once | INTRAMUSCULAR | Status: AC
  Administered 2016-04-10: 40000 [IU] via SUBCUTANEOUS
  Filled 2016-04-10: qty 1

## 2016-04-13 ENCOUNTER — Other Ambulatory Visit: Payer: Self-pay | Admitting: *Deleted

## 2016-04-13 ENCOUNTER — Other Ambulatory Visit: Payer: Self-pay | Admitting: Internal Medicine

## 2016-04-13 ENCOUNTER — Telehealth: Payer: Self-pay | Admitting: *Deleted

## 2016-04-13 ENCOUNTER — Telehealth: Payer: Self-pay

## 2016-04-13 ENCOUNTER — Inpatient Hospital Stay: Payer: Medicare Other

## 2016-04-13 VITALS — BP 133/67 | HR 61

## 2016-04-13 DIAGNOSIS — D631 Anemia in chronic kidney disease: Secondary | ICD-10-CM

## 2016-04-13 DIAGNOSIS — D571 Sickle-cell disease without crisis: Secondary | ICD-10-CM

## 2016-04-13 DIAGNOSIS — I129 Hypertensive chronic kidney disease with stage 1 through stage 4 chronic kidney disease, or unspecified chronic kidney disease: Secondary | ICD-10-CM | POA: Diagnosis not present

## 2016-04-13 DIAGNOSIS — N184 Chronic kidney disease, stage 4 (severe): Principal | ICD-10-CM

## 2016-04-13 DIAGNOSIS — N189 Chronic kidney disease, unspecified: Principal | ICD-10-CM

## 2016-04-13 LAB — SAMPLE TO BLOOD BANK

## 2016-04-13 LAB — HEMATOCRIT: HEMATOCRIT: 15.4 % — AB (ref 35.0–47.0)

## 2016-04-13 LAB — HEMOGLOBIN: HEMOGLOBIN: 5.6 g/dL — AB (ref 12.0–16.0)

## 2016-04-13 MED ORDER — EPOETIN ALFA 40000 UNIT/ML IJ SOLN
40000.0000 [IU] | Freq: Once | INTRAMUSCULAR | Status: AC
  Administered 2016-04-13: 40000 [IU] via SUBCUTANEOUS
  Filled 2016-04-13: qty 1

## 2016-04-13 MED ORDER — HEPARIN SOD (PORK) LOCK FLUSH 100 UNIT/ML IV SOLN: 250.0000 [IU] | INTRAVENOUS | Status: DC | PRN

## 2016-04-13 MED ORDER — FUROSEMIDE 10 MG/ML IJ SOLN: 20.0000 mg | Freq: Once | INTRAMUSCULAR | Status: DC

## 2016-04-13 MED ORDER — DIPHENHYDRAMINE HCL 25 MG PO CAPS: 25.0000 mg | ORAL_CAPSULE | Freq: Once | ORAL | Status: DC

## 2016-04-13 MED ORDER — SODIUM CHLORIDE 0.9% FLUSH
10.0000 mL | INTRAVENOUS | Status: DC | PRN
  Filled 2016-04-13: qty 10

## 2016-04-13 MED ORDER — SODIUM CHLORIDE 0.9 % IV SOLN: 250.0000 mL | Freq: Once | INTRAVENOUS | Status: DC

## 2016-04-13 MED ORDER — SODIUM CHLORIDE 0.9% FLUSH
3.0000 mL | INTRAVENOUS | Status: DC | PRN
  Filled 2016-04-13: qty 3

## 2016-04-13 MED ORDER — HEPARIN SOD (PORK) LOCK FLUSH 100 UNIT/ML IV SOLN: 500.0000 [IU] | Freq: Every day | INTRAVENOUS | Status: DC | PRN

## 2016-04-13 MED ORDER — ACETAMINOPHEN 325 MG PO TABS: 650.0000 mg | ORAL_TABLET | Freq: Once | ORAL | Status: DC

## 2016-04-13 NOTE — Telephone Encounter (Signed)
2 units of blood tom. Orders entered and released by Dr. Rogue Bussing

## 2016-04-13 NOTE — Telephone Encounter (Signed)
Contacted blood bank. Verified blood bank orders rcvd. Will proceed with 2 units of blood tomorrow at 9am.

## 2016-04-13 NOTE — Telephone Encounter (Signed)
Patient feels that she is needing a blood transfusion this week.  She is starting to have symptoms of SOBr and joint pain.   Patient's results below:

## 2016-04-14 ENCOUNTER — Other Ambulatory Visit: Payer: Self-pay | Admitting: Internal Medicine

## 2016-04-14 ENCOUNTER — Inpatient Hospital Stay: Payer: Medicare Other

## 2016-04-14 DIAGNOSIS — D631 Anemia in chronic kidney disease: Secondary | ICD-10-CM

## 2016-04-14 DIAGNOSIS — N184 Chronic kidney disease, stage 4 (severe): Principal | ICD-10-CM

## 2016-04-14 DIAGNOSIS — I129 Hypertensive chronic kidney disease with stage 1 through stage 4 chronic kidney disease, or unspecified chronic kidney disease: Secondary | ICD-10-CM | POA: Diagnosis not present

## 2016-04-14 LAB — PREPARE RBC (CROSSMATCH)

## 2016-04-14 MED ORDER — SODIUM CHLORIDE 0.9% FLUSH
10.0000 mL | INTRAVENOUS | Status: AC | PRN
  Administered 2016-04-14: 10 mL
  Filled 2016-04-14: qty 10

## 2016-04-14 MED ORDER — SODIUM CHLORIDE 0.9% FLUSH
3.0000 mL | INTRAVENOUS | Status: DC | PRN
  Filled 2016-04-14: qty 3

## 2016-04-14 MED ORDER — HEPARIN SOD (PORK) LOCK FLUSH 100 UNIT/ML IV SOLN: 250.0000 [IU] | INTRAVENOUS | Status: DC | PRN

## 2016-04-14 MED ORDER — HEPARIN SOD (PORK) LOCK FLUSH 100 UNIT/ML IV SOLN
500.0000 [IU] | Freq: Every day | INTRAVENOUS | Status: AC | PRN
  Administered 2016-04-14: 500 [IU]

## 2016-04-14 MED ORDER — HEPARIN SOD (PORK) LOCK FLUSH 100 UNIT/ML IV SOLN: 500.0000 [IU] | Freq: Every day | INTRAVENOUS | Status: DC | PRN

## 2016-04-14 MED ORDER — SODIUM CHLORIDE 0.9% FLUSH
10.0000 mL | INTRAVENOUS | Status: DC | PRN
  Filled 2016-04-14: qty 10

## 2016-04-14 MED ORDER — DIPHENHYDRAMINE HCL 25 MG PO CAPS: 25.0000 mg | ORAL_CAPSULE | Freq: Once | ORAL | Status: DC

## 2016-04-14 MED ORDER — FUROSEMIDE 10 MG/ML IJ SOLN: 20.0000 mg | Freq: Once | INTRAMUSCULAR | Status: DC

## 2016-04-14 MED ORDER — SODIUM CHLORIDE 0.9 % IV SOLN
250.0000 mL | Freq: Once | INTRAVENOUS | Status: DC
  Filled 2016-04-14: qty 250

## 2016-04-14 MED ORDER — FUROSEMIDE 10 MG/ML IJ SOLN
20.0000 mg | Freq: Once | INTRAMUSCULAR | Status: AC
  Administered 2016-04-14: 20 mg via INTRAVENOUS
  Filled 2016-04-14: qty 2

## 2016-04-14 MED ORDER — ACETAMINOPHEN 325 MG PO TABS: 650.0000 mg | ORAL_TABLET | Freq: Once | ORAL | Status: DC

## 2016-04-14 MED ORDER — DIPHENHYDRAMINE HCL 25 MG PO CAPS
25.0000 mg | ORAL_CAPSULE | Freq: Once | ORAL | Status: AC
  Administered 2016-04-14: 25 mg via ORAL
  Filled 2016-04-14: qty 1

## 2016-04-14 MED ORDER — ACETAMINOPHEN 325 MG PO TABS
650.0000 mg | ORAL_TABLET | Freq: Once | ORAL | Status: AC
  Administered 2016-04-14: 650 mg via ORAL
  Filled 2016-04-14: qty 2

## 2016-04-15 ENCOUNTER — Inpatient Hospital Stay: Payer: Medicare Other

## 2016-04-15 VITALS — BP 135/66 | HR 64 | Resp 20

## 2016-04-15 DIAGNOSIS — D631 Anemia in chronic kidney disease: Secondary | ICD-10-CM

## 2016-04-15 DIAGNOSIS — I129 Hypertensive chronic kidney disease with stage 1 through stage 4 chronic kidney disease, or unspecified chronic kidney disease: Secondary | ICD-10-CM | POA: Diagnosis not present

## 2016-04-15 DIAGNOSIS — D571 Sickle-cell disease without crisis: Secondary | ICD-10-CM

## 2016-04-15 DIAGNOSIS — N189 Chronic kidney disease, unspecified: Principal | ICD-10-CM

## 2016-04-15 LAB — TYPE AND SCREEN
ABO/RH(D): O POS
ANTIBODY SCREEN: NEGATIVE
UNIT DIVISION: 0
UNIT DIVISION: 0

## 2016-04-15 MED ORDER — EPOETIN ALFA 40000 UNIT/ML IJ SOLN
40000.0000 [IU] | Freq: Once | INTRAMUSCULAR | Status: AC
  Administered 2016-04-15: 40000 [IU] via SUBCUTANEOUS
  Filled 2016-04-15: qty 1

## 2016-04-15 NOTE — Progress Notes (Signed)
Difficulty getting lab drawn this am. Per Dr Rogue Bussing okay to give Procrit based on previous Hbg

## 2016-04-17 ENCOUNTER — Inpatient Hospital Stay: Payer: Medicare Other

## 2016-04-17 VITALS — BP 128/56 | HR 60 | Resp 20

## 2016-04-17 DIAGNOSIS — D571 Sickle-cell disease without crisis: Secondary | ICD-10-CM

## 2016-04-17 DIAGNOSIS — N189 Chronic kidney disease, unspecified: Principal | ICD-10-CM

## 2016-04-17 DIAGNOSIS — D631 Anemia in chronic kidney disease: Secondary | ICD-10-CM

## 2016-04-17 DIAGNOSIS — I129 Hypertensive chronic kidney disease with stage 1 through stage 4 chronic kidney disease, or unspecified chronic kidney disease: Secondary | ICD-10-CM | POA: Diagnosis not present

## 2016-04-17 LAB — SAMPLE TO BLOOD BANK

## 2016-04-17 LAB — HEMOGLOBIN: Hemoglobin: 9.1 g/dL — ABNORMAL LOW (ref 12.0–16.0)

## 2016-04-17 LAB — HEMATOCRIT: HEMATOCRIT: 26.6 % — AB (ref 35.0–47.0)

## 2016-04-17 MED ORDER — EPOETIN ALFA 40000 UNIT/ML IJ SOLN
40000.0000 [IU] | Freq: Once | INTRAMUSCULAR | Status: AC
  Administered 2016-04-17: 40000 [IU] via SUBCUTANEOUS
  Filled 2016-04-17: qty 1

## 2016-04-20 ENCOUNTER — Inpatient Hospital Stay: Payer: Medicare Other

## 2016-04-20 ENCOUNTER — Inpatient Hospital Stay (HOSPITAL_BASED_OUTPATIENT_CLINIC_OR_DEPARTMENT_OTHER): Payer: Medicare Other | Admitting: Internal Medicine

## 2016-04-20 VITALS — BP 149/65 | HR 50 | Temp 98.1°F | Resp 18 | Wt 192.8 lb

## 2016-04-20 DIAGNOSIS — N184 Chronic kidney disease, stage 4 (severe): Principal | ICD-10-CM

## 2016-04-20 DIAGNOSIS — E559 Vitamin D deficiency, unspecified: Secondary | ICD-10-CM

## 2016-04-20 DIAGNOSIS — D571 Sickle-cell disease without crisis: Secondary | ICD-10-CM

## 2016-04-20 DIAGNOSIS — D631 Anemia in chronic kidney disease: Secondary | ICD-10-CM

## 2016-04-20 DIAGNOSIS — N189 Chronic kidney disease, unspecified: Principal | ICD-10-CM

## 2016-04-20 DIAGNOSIS — R5383 Other fatigue: Secondary | ICD-10-CM

## 2016-04-20 DIAGNOSIS — N183 Chronic kidney disease, stage 3 unspecified: Secondary | ICD-10-CM

## 2016-04-20 DIAGNOSIS — M109 Gout, unspecified: Secondary | ICD-10-CM

## 2016-04-20 DIAGNOSIS — I509 Heart failure, unspecified: Secondary | ICD-10-CM

## 2016-04-20 DIAGNOSIS — J449 Chronic obstructive pulmonary disease, unspecified: Secondary | ICD-10-CM

## 2016-04-20 DIAGNOSIS — Z79899 Other long term (current) drug therapy: Secondary | ICD-10-CM | POA: Diagnosis not present

## 2016-04-20 DIAGNOSIS — N186 End stage renal disease: Secondary | ICD-10-CM | POA: Diagnosis not present

## 2016-04-20 DIAGNOSIS — I10 Essential (primary) hypertension: Secondary | ICD-10-CM

## 2016-04-20 DIAGNOSIS — I129 Hypertensive chronic kidney disease with stage 1 through stage 4 chronic kidney disease, or unspecified chronic kidney disease: Secondary | ICD-10-CM | POA: Diagnosis not present

## 2016-04-20 DIAGNOSIS — I4891 Unspecified atrial fibrillation: Secondary | ICD-10-CM

## 2016-04-20 DIAGNOSIS — D696 Thrombocytopenia, unspecified: Secondary | ICD-10-CM

## 2016-04-20 DIAGNOSIS — G4730 Sleep apnea, unspecified: Secondary | ICD-10-CM

## 2016-04-20 DIAGNOSIS — Z803 Family history of malignant neoplasm of breast: Secondary | ICD-10-CM

## 2016-04-20 DIAGNOSIS — L439 Lichen planus, unspecified: Secondary | ICD-10-CM

## 2016-04-20 DIAGNOSIS — R948 Abnormal results of function studies of other organs and systems: Secondary | ICD-10-CM

## 2016-04-20 DIAGNOSIS — E875 Hyperkalemia: Secondary | ICD-10-CM

## 2016-04-20 LAB — CBC WITH DIFFERENTIAL/PLATELET
Band Neutrophils: 0 %
Basophils Absolute: 0.1 10*3/uL (ref 0–0.1)
Basophils Relative: 2 %
Blasts: 0 %
EOS PCT: 5 %
Eosinophils Absolute: 0.3 10*3/uL (ref 0–0.7)
HEMATOCRIT: 24.6 % — AB (ref 35.0–47.0)
HEMOGLOBIN: 8.4 g/dL — AB (ref 12.0–16.0)
LYMPHS ABS: 1.6 10*3/uL (ref 1.0–3.6)
Lymphocytes Relative: 23 %
MCH: 35.2 pg — ABNORMAL HIGH (ref 26.0–34.0)
MCHC: 34.2 g/dL (ref 32.0–36.0)
MCV: 102.9 fL — AB (ref 80.0–100.0)
MONOS PCT: 16 %
MYELOCYTES: 0 %
Metamyelocytes Relative: 0 %
Monocytes Absolute: 1.1 10*3/uL — ABNORMAL HIGH (ref 0.2–0.9)
NEUTROS PCT: 54 %
NRBC: 15 /100{WBCs} — AB
Neutro Abs: 3.8 10*3/uL (ref 1.4–6.5)
Other: 0 %
Platelets: 129 10*3/uL — ABNORMAL LOW (ref 150–440)
Promyelocytes Absolute: 0 %
RBC: 2.39 MIL/uL — AB (ref 3.80–5.20)
RDW: 34.1 % — AB (ref 11.5–14.5)
WBC: 6.9 10*3/uL (ref 3.6–11.0)

## 2016-04-20 LAB — COMPREHENSIVE METABOLIC PANEL
ALK PHOS: 201 U/L — AB (ref 38–126)
ALT: 23 U/L (ref 14–54)
AST: 44 U/L — AB (ref 15–41)
Albumin: 3.6 g/dL (ref 3.5–5.0)
Anion gap: 5 (ref 5–15)
BILIRUBIN TOTAL: 6 mg/dL — AB (ref 0.3–1.2)
BUN: 56 mg/dL — AB (ref 6–20)
CALCIUM: 9.1 mg/dL (ref 8.9–10.3)
CO2: 22 mmol/L (ref 22–32)
CREATININE: 3.02 mg/dL — AB (ref 0.44–1.00)
Chloride: 108 mmol/L (ref 101–111)
GFR calc Af Amer: 18 mL/min — ABNORMAL LOW (ref >60)
GFR, EST NON AFRICAN AMERICAN: 15 mL/min — AB (ref >60)
Glucose, Bld: 82 mg/dL (ref 65–99)
POTASSIUM: 5.8 mmol/L — AB (ref 3.5–5.1)
Sodium: 135 mmol/L (ref 135–145)
TOTAL PROTEIN: 6.8 g/dL (ref 6.5–8.1)

## 2016-04-20 LAB — IRON AND TIBC
IRON: 166 ug/dL (ref 28–170)
Saturation Ratios: 51 % — ABNORMAL HIGH (ref 10.4–31.8)
TIBC: 324 ug/dL (ref 250–450)
UIBC: 158 ug/dL

## 2016-04-20 LAB — SAMPLE TO BLOOD BANK

## 2016-04-20 LAB — FERRITIN: Ferritin: 477 ng/mL — ABNORMAL HIGH (ref 11–307)

## 2016-04-20 MED ORDER — EPOETIN ALFA 40000 UNIT/ML IJ SOLN
40000.0000 [IU] | Freq: Once | INTRAMUSCULAR | Status: AC
  Administered 2016-04-20: 40000 [IU] via SUBCUTANEOUS
  Filled 2016-04-20: qty 1

## 2016-04-20 NOTE — Assessment & Plan Note (Addendum)
#   SEVERE ANEMIA-Multifactorial chronic kidney disease/iron deficiency/sickle cell disease. Brisk bone marrow response  # Severe anemia hemoglobin today 8.5 today. On  Procrit 40,000 units Monday Wednesday Friday.  ferrahem as needed. Check Iron studies today.   # Sickle cell disease- confirmed on hemoglobin electrophoresis. On folic acid once a day. On  Hydrea 500 mg once a day.  #  Chronic kidney disease/ acute renal failure- today creatinine is 3.5 [planning HD/fistula]- not on HD yet.   # Hyperkalemia- Potassium 5.8 recommend taking extra lasix today.   # COPD/OSA/CHF [Dr.Kasa] on Home O2 -2L [place] prn  CPAP.   # Elevated bilirubin 6 chronic/ secondary to cholestasis from sickle cell disease.  #   Patient will follow-up with me in approximately  3 months; continue procrit to three times a week;

## 2016-04-20 NOTE — Progress Notes (Signed)
Patient is here for follow up she is doing well no complaints  

## 2016-04-20 NOTE — Progress Notes (Signed)
[facility] OFFICE PROGRESS NOTE  Patient Care Team: Leone Haven, MD as PCP - General (Family Medicine) Seeplaputhur Robinette Haines, MD (General Surgery) Rubbie Battiest, NP as Nurse Practitioner (Gerontology) Flora Lipps, MD as Consulting Physician (Pulmonary Disease) Yolonda Kida, MD as Consulting Physician (Cardiology)   SUMMARY OF ONCOLOGIC HISTORY:  # Severe Anemia- multifactorial [as per Pt baseline 6-6.5] SCD/CKD- on procrit 40K/IV Iron; May 2017- Procrit 40K W-F; July 2017-Start IV Ferrahem q2W; SEP 2017- M-W-F    # SICKLE CELL ANEMIA/ Congestive hepatopathy [Dec 2016- Bili 1s s/p liver bx; [facility]]; May 2017- RestartHydrea 500 qD  # Acute renal failure/ CKD [Dr.Kolluru]/ CHF [Dr.]  # Jan 2017 ? Endocarditis s/p IV Abx/ A. Fib [not on anti-coag]   INTERVAL HISTORY:  65 year old female patient with above history of sickle cell anemia chronic kidney disease/CHF- is here for follow-up. Patient had a port placement in the interim. Her fistula was not working. She is awaiting a revision.  Patient last received 2 units of blood approximately 1 week ago. Today hemoglobin is 8.5. However her hemoglobin has been more steady since increasing the Procrit to 40,000 units 3 times a week.  Chronic mild fatigue. No infections. No fevers.  REVIEW OF SYSTEMS:  A complete 10 point review of system is done which is negative except mentioned above/history of present illness.   PAST MEDICAL HISTORY :  Past Medical History:  Diagnosis Date  . Anemia   . 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 Cheree [facility] at [facility]  . CHF (congestive heart failure) ([place])   . Congestive heart failure (CHF) ([place])   . Congestive heart failure (CHF) ([place]) 06/2015  . COPD (chronic obstructive pulmonary disease) (HCC)    symptoms Dr. Patricia Pesa   . Gout   . Hypertension   . Lichen planus   . Osteoporosis 2011   osteopenia  . SCA-1 (spinocerebellar ataxia type 1) ([place])   . Sickle cell anemia (HCC)     sickle cell disease  . Sickle cell anemia (HCC)   . Thrombocytopenia ([place])   . Vitamin D deficiency     PAST SURGICAL HISTORY :   Past Surgical History:  Procedure Laterality Date  . AV FISTULA PLACEMENT Left 02/27/2016   Procedure: INSERTION OF ARTERIOVENOUS (AV) GORE-TEX GRAFT ARM ( FOREARM LOOP );  Surgeon: Algernon Huxley, MD;  Location: [facility] ORS;  Service: Vascular;  Laterality: Left;  . CHOLECYSTECTOMY    . COLONOSCOPY  2014   [facility]  . EYE SURGERY     cataract bilateral  . GALLBLADDER SURGERY    . PERIPHERAL VASCULAR CATHETERIZATION Left 06/14/2015   Procedure: Dialysis/Perma Catheter Insertion;  Surgeon: Katha Cabal, MD;  Location: [place] [facility];  Service: Cardiovascular;  Laterality: Left;  . PERIPHERAL VASCULAR CATHETERIZATION N/A 01/23/2016   Procedure: Glori Luis Cath Insertion;  Surgeon: Algernon Huxley, MD;  Location: [place] CV LAB;  Service: Cardiovascular;  Laterality: N/A;  . PERIPHERAL VASCULAR CATHETERIZATION Left 03/30/2016   Procedure: A/V Shuntogram/Fistulagram;  Surgeon: Algernon Huxley, MD;  Location: [place] CV LAB;  Service: Cardiovascular;  Laterality: Left;  . TUBAL LIGATION      FAMILY HISTORY :   Family History  Problem Relation Age of Onset  . Heart disease Father   . Diabetes Father   . Diabetes Sister   . Cancer Mother     breast cancer  . Cancer Maternal Aunt     Breast cancer  SOCIAL HISTORY:   Social History  Substance Use Topics  . Smoking status: Former Smoker    Packs/day: 0.25    Years: 30.00    Types: Cigarettes    Quit date: 04/29/2015  . Smokeless tobacco: Never Used     Comment: quit 1 month ago  . Alcohol use No    ALLERGIES:  is allergic to ramipril and septra [sulfamethoxazole-trimethoprim].  MEDICATIONS:  Current Outpatient Prescriptions  Medication Sig Dispense Refill  . amiodarone (PACERONE) 100 MG tablet Take 100 mg by mouth daily.    . calcitRIOL (ROCALTROL) 0.25 MCG capsule Take 0.25 mcg by mouth  daily.    Marland Kitchen epoetin alfa (EPOGEN,PROCRIT) 60454 UNIT/ML injection Inject into the skin 3 (three) times a week.    . fluticasone furoate-vilanterol (BREO ELLIPTA) 200-25 MCG/INH AEPB Inhale 1 puff into the lungs daily. 60 each 5  . folic acid (FOLVITE) 1 MG tablet Take 1 mg by mouth daily.    . furosemide (LASIX) 80 MG tablet Take 1 tablet (80 mg total) by mouth 2 (two) times daily. (Patient taking differently: Take 80 mg by mouth daily. May take an additional 80mg s as needed for fluid) 30 tablet 0  . hydroxyurea (HYDREA) 500 MG capsule Take 1 capsule (500 mg total) by mouth daily. May take with food to minimize GI side effects. 60 capsule 3  . iron dextran complex in sodium chloride 0.9 % 500 mL Inject into the vein.    . naproxen sodium (ANAPROX) 220 MG tablet Take 440 mg by mouth 2 (two) times daily as needed (pain).    . OXYGEN Inhale into the lungs.    . sodium bicarbonate 650 MG tablet Take 650 mg by mouth 2 (two) times daily.      No current facility-administered medications for this visit.    Facility-Administered Medications Ordered in Other Visits  Medication Dose Route Frequency Provider Last Rate Last Dose  . 0.9 %  sodium chloride infusion  250 mL Intravenous Once Cammie Sickle, MD      . acetaminophen (TYLENOL) tablet 650 mg  650 mg Oral Once Cammie Sickle, MD      . acetaminophen (TYLENOL) tablet 650 mg  650 mg Oral Once Cammie Sickle, MD      . diphenhydrAMINE (BENADRYL) capsule 25 mg  25 mg Oral Once Cammie Sickle, MD      . diphenhydrAMINE (BENADRYL) capsule 25 mg  25 mg Oral Once Cammie Sickle, MD      . furosemide (LASIX) injection 20 mg  20 mg Intravenous Once Cammie Sickle, MD      . furosemide (LASIX) injection 20 mg  20 mg Intravenous Once Cammie Sickle, MD      . heparin lock flush 100 unit/mL  250 Units Intracatheter PRN Leia Alf, MD      . heparin lock flush 100 unit/mL  500 Units Intracatheter Daily PRN Cammie Sickle, MD      . heparin lock flush 100 unit/mL  250 Units Intracatheter PRN Cammie Sickle, MD      . heparin lock flush 100 unit/mL  500 Units Intracatheter Daily PRN Cammie Sickle, MD      . heparin lock flush 100 unit/mL  250 Units Intracatheter PRN Cammie Sickle, MD      . sodium chloride flush (NS) 0.9 % injection 10 mL  10 mL Intracatheter PRN Cammie Sickle, MD      . sodium chloride  flush (NS) 0.9 % injection 10 mL  10 mL Intracatheter PRN Cammie Sickle, MD      . sodium chloride flush (NS) 0.9 % injection 3 mL  3 mL Intracatheter PRN Cammie Sickle, MD      . sodium chloride flush (NS) 0.9 % injection 3 mL  3 mL Intracatheter PRN Cammie Sickle, MD        PHYSICAL EXAMINATION:   BP (!) 149/65 (BP Location: Left Arm, Patient Position: Sitting)   Pulse (!) 50   Temp 98.1 F (36.7 C) (Tympanic)   Resp 18   Wt 192 lb 12.8 oz (87.5 kg)   SpO2 93%   BMI 30.20 kg/m   Filed Weights   04/20/16 1031  Weight: 192 lb 12.8 oz (87.5 kg)    GENERAL: Well-nourished well-developed; Alert, no distress and comfortable.  With her husband.She is walking by herself. Accompanied by her husband. She is on nasal Oxygen. EYES: positive for pallor/no icterus.  OROPHARYNX: no thrush or ulceration; good dentition  NECK: supple, no masses felt LYMPH:  no palpable lymphadenopathy in the cervical, axillary or inguinal regions LUNGS: decreased breath sounds to auscultation and  No wheeze or crackles HEART/CVS: regular rate & rhythm and no murmurs; No positive lower extremity edema ABDOMEN:abdomen soft, non-tender and normal bowel sounds Musculoskeletal:no cyanosis of digits and no clubbing; PSYCH: alert & oriented x 3 with fluent speech NEURO: no focal motor/sensory deficits SKIN: lichen planus lesions on the Legs bil [>15 years per pt]  LABORATORY DATA:  I have reviewed the data as listed    Component Value Date/Time   NA 135 04/20/2016 0929    NA 138 06/20/2012 1400   K 5.8 (H) 04/20/2016 0929   K 4.3 06/20/2012 1400   CL 108 04/20/2016 0929   CL 105 06/20/2012 1400   CO2 22 04/20/2016 0929   CO2 24 06/20/2012 1400   GLUCOSE 82 04/20/2016 0929   GLUCOSE 102 (H) 06/20/2012 1400   BUN 56 (H) 04/20/2016 0929   BUN 13 06/20/2012 1400   CREATININE 3.02 (H) 04/20/2016 0929   CREATININE 1.36 (H) 03/22/2013 1522   CALCIUM 9.1 04/20/2016 0929   CALCIUM 8.4 (L) 12/23/2015 1039   PROT 6.8 04/20/2016 0929   PROT 7.1 03/22/2013 1522   ALBUMIN 3.6 04/20/2016 0929   ALBUMIN 3.7 03/22/2013 1522   AST 44 (H) 04/20/2016 0929   AST 48 (H) 03/22/2013 1522   ALT 23 04/20/2016 0929   ALT 25 03/22/2013 1522   ALKPHOS 201 (H) 04/20/2016 0929   ALKPHOS 139 (H) 03/22/2013 1522   BILITOT 6.0 (H) 04/20/2016 0929   BILITOT 4.0 (H) 03/22/2013 1522   GFRNONAA 15 (L) 04/20/2016 0929   GFRNONAA 42 (L) 03/22/2013 1522   GFRAA 18 (L) 04/20/2016 0929   GFRAA 48 (L) 03/22/2013 1522    No results found for: SPEP, UPEP  Lab Results  Component Value Date   WBC 6.9 04/20/2016   NEUTROABS 3.8 04/20/2016   HGB 8.4 (L) 04/20/2016   HCT 24.6 (L) 04/20/2016   MCV 102.9 (H) 04/20/2016   PLT 129 (L) 04/20/2016      Chemistry      Component Value Date/Time   NA 135 04/20/2016 0929   NA 138 06/20/2012 1400   K 5.8 (H) 04/20/2016 0929   K 4.3 06/20/2012 1400   CL 108 04/20/2016 0929   CL 105 06/20/2012 1400   CO2 22 04/20/2016 0929   CO2 24 06/20/2012 1400  BUN 56 (H) 04/20/2016 0929   BUN 13 06/20/2012 1400   CREATININE 3.02 (H) 04/20/2016 0929   CREATININE 1.36 (H) 03/22/2013 1522   GLU 89 02/20/2010      Component Value Date/Time   CALCIUM 9.1 04/20/2016 0929   CALCIUM 8.4 (L) 12/23/2015 1039   ALKPHOS 201 (H) 04/20/2016 0929   ALKPHOS 139 (H) 03/22/2013 1522   AST 44 (H) 04/20/2016 0929   AST 48 (H) 03/22/2013 1522   ALT 23 04/20/2016 0929   ALT 25 03/22/2013 1522   BILITOT 6.0 (H) 04/20/2016 0929   BILITOT 4.0 (H) 03/22/2013  1522         ASSESSMENT & PLAN:   Anemia of chronic renal failure, stage 4 (severe) ([place]) # SEVERE ANEMIA-Multifactorial chronic kidney disease/iron deficiency/sickle cell disease. Brisk bone marrow response  # Severe anemia hemoglobin today 8.5 today. On  Procrit 40,000 units Monday Wednesday Friday.  ferrahem as needed. Check Iron studies today.   # Sickle cell disease- confirmed on hemoglobin electrophoresis. On folic acid once a day. On  Hydrea 500 mg once a day.  #  Chronic kidney disease/ acute renal failure- today creatinine is 3.5 [planning HD/fistula]- not on HD yet.   # Hyperkalemia- Potassium 5.8 recommend taking extra lasix today.   # COPD/OSA/CHF [Dr.Kasa] on Home O2 -2L [place] prn  CPAP.   # Elevated bilirubin 6 chronic/ secondary to cholestasis from sickle cell disease.  #   Patient will follow-up with me in approximately  3 months; continue procrit to three times a week;    Cammie Sickle, MD 04/20/2016 1:38 PM

## 2016-04-22 ENCOUNTER — Inpatient Hospital Stay: Payer: Medicare Other

## 2016-04-22 VITALS — BP 146/63 | HR 62

## 2016-04-22 DIAGNOSIS — I129 Hypertensive chronic kidney disease with stage 1 through stage 4 chronic kidney disease, or unspecified chronic kidney disease: Secondary | ICD-10-CM | POA: Diagnosis not present

## 2016-04-22 DIAGNOSIS — N189 Chronic kidney disease, unspecified: Principal | ICD-10-CM

## 2016-04-22 DIAGNOSIS — N184 Chronic kidney disease, stage 4 (severe): Principal | ICD-10-CM

## 2016-04-22 DIAGNOSIS — D571 Sickle-cell disease without crisis: Secondary | ICD-10-CM

## 2016-04-22 DIAGNOSIS — D631 Anemia in chronic kidney disease: Secondary | ICD-10-CM

## 2016-04-22 LAB — HEMOGLOBIN: HEMOGLOBIN: 8.2 g/dL — AB (ref 12.0–16.0)

## 2016-04-22 LAB — SAMPLE TO BLOOD BANK

## 2016-04-22 LAB — HEMATOCRIT: HEMATOCRIT: 23.8 % — AB (ref 35.0–47.0)

## 2016-04-22 MED ORDER — EPOETIN ALFA 40000 UNIT/ML IJ SOLN
40000.0000 [IU] | Freq: Once | INTRAMUSCULAR | Status: AC
  Administered 2016-04-22: 40000 [IU] via SUBCUTANEOUS
  Filled 2016-04-22: qty 1

## 2016-04-27 ENCOUNTER — Inpatient Hospital Stay: Payer: Medicare Other

## 2016-04-27 VITALS — BP 124/63 | HR 57 | Temp 98.2°F | Resp 18

## 2016-04-27 DIAGNOSIS — N184 Chronic kidney disease, stage 4 (severe): Principal | ICD-10-CM

## 2016-04-27 DIAGNOSIS — N189 Chronic kidney disease, unspecified: Principal | ICD-10-CM

## 2016-04-27 DIAGNOSIS — D631 Anemia in chronic kidney disease: Secondary | ICD-10-CM

## 2016-04-27 DIAGNOSIS — D571 Sickle-cell disease without crisis: Secondary | ICD-10-CM

## 2016-04-27 DIAGNOSIS — I129 Hypertensive chronic kidney disease with stage 1 through stage 4 chronic kidney disease, or unspecified chronic kidney disease: Secondary | ICD-10-CM | POA: Diagnosis not present

## 2016-04-27 LAB — SAMPLE TO BLOOD BANK

## 2016-04-27 LAB — HEMATOCRIT: HEMATOCRIT: 21.3 % — AB (ref 35.0–47.0)

## 2016-04-27 LAB — HEMOGLOBIN: HEMOGLOBIN: 7.5 g/dL — AB (ref 12.0–16.0)

## 2016-04-27 MED ORDER — EPOETIN ALFA 40000 UNIT/ML IJ SOLN
40000.0000 [IU] | Freq: Once | INTRAMUSCULAR | Status: AC
  Administered 2016-04-27: 40000 [IU] via SUBCUTANEOUS
  Filled 2016-04-27: qty 1

## 2016-04-29 ENCOUNTER — Inpatient Hospital Stay: Payer: Medicare Other

## 2016-04-29 VITALS — BP 129/64 | HR 64 | Temp 97.2°F | Resp 18

## 2016-04-29 DIAGNOSIS — D631 Anemia in chronic kidney disease: Secondary | ICD-10-CM

## 2016-04-29 DIAGNOSIS — N189 Chronic kidney disease, unspecified: Principal | ICD-10-CM

## 2016-04-29 DIAGNOSIS — I129 Hypertensive chronic kidney disease with stage 1 through stage 4 chronic kidney disease, or unspecified chronic kidney disease: Secondary | ICD-10-CM | POA: Diagnosis not present

## 2016-04-29 MED ORDER — EPOETIN ALFA 40000 UNIT/ML IJ SOLN
40000.0000 [IU] | Freq: Once | INTRAMUSCULAR | Status: AC
  Administered 2016-04-29: 40000 [IU] via SUBCUTANEOUS
  Filled 2016-04-29: qty 1

## 2016-05-01 ENCOUNTER — Inpatient Hospital Stay: Payer: Medicare Other | Attending: Internal Medicine

## 2016-05-01 VITALS — BP 129/53 | HR 71 | Temp 96.5°F | Resp 18

## 2016-05-01 DIAGNOSIS — Z79899 Other long term (current) drug therapy: Secondary | ICD-10-CM | POA: Diagnosis not present

## 2016-05-01 DIAGNOSIS — M818 Other osteoporosis without current pathological fracture: Secondary | ICD-10-CM | POA: Insufficient documentation

## 2016-05-01 DIAGNOSIS — J449 Chronic obstructive pulmonary disease, unspecified: Secondary | ICD-10-CM | POA: Diagnosis not present

## 2016-05-01 DIAGNOSIS — I509 Heart failure, unspecified: Secondary | ICD-10-CM | POA: Insufficient documentation

## 2016-05-01 DIAGNOSIS — Z9049 Acquired absence of other specified parts of digestive tract: Secondary | ICD-10-CM | POA: Insufficient documentation

## 2016-05-01 DIAGNOSIS — E559 Vitamin D deficiency, unspecified: Secondary | ICD-10-CM | POA: Diagnosis not present

## 2016-05-01 DIAGNOSIS — Z803 Family history of malignant neoplasm of breast: Secondary | ICD-10-CM | POA: Insufficient documentation

## 2016-05-01 DIAGNOSIS — I4891 Unspecified atrial fibrillation: Secondary | ICD-10-CM | POA: Diagnosis not present

## 2016-05-01 DIAGNOSIS — D571 Sickle-cell disease without crisis: Secondary | ICD-10-CM | POA: Diagnosis not present

## 2016-05-01 DIAGNOSIS — D631 Anemia in chronic kidney disease: Secondary | ICD-10-CM | POA: Diagnosis not present

## 2016-05-01 DIAGNOSIS — D151 Benign neoplasm of heart: Secondary | ICD-10-CM | POA: Insufficient documentation

## 2016-05-01 DIAGNOSIS — D696 Thrombocytopenia, unspecified: Secondary | ICD-10-CM | POA: Insufficient documentation

## 2016-05-01 DIAGNOSIS — R0602 Shortness of breath: Secondary | ICD-10-CM | POA: Diagnosis not present

## 2016-05-01 DIAGNOSIS — I129 Hypertensive chronic kidney disease with stage 1 through stage 4 chronic kidney disease, or unspecified chronic kidney disease: Secondary | ICD-10-CM | POA: Diagnosis present

## 2016-05-01 DIAGNOSIS — M109 Gout, unspecified: Secondary | ICD-10-CM | POA: Insufficient documentation

## 2016-05-01 DIAGNOSIS — K761 Chronic passive congestion of liver: Secondary | ICD-10-CM | POA: Diagnosis not present

## 2016-05-01 DIAGNOSIS — L439 Lichen planus, unspecified: Secondary | ICD-10-CM | POA: Diagnosis not present

## 2016-05-01 DIAGNOSIS — N189 Chronic kidney disease, unspecified: Secondary | ICD-10-CM | POA: Insufficient documentation

## 2016-05-01 DIAGNOSIS — M25552 Pain in left hip: Secondary | ICD-10-CM | POA: Diagnosis not present

## 2016-05-01 MED ORDER — EPOETIN ALFA 40000 UNIT/ML IJ SOLN
40000.0000 [IU] | Freq: Once | INTRAMUSCULAR | Status: AC
  Administered 2016-05-01: 40000 [IU] via SUBCUTANEOUS
  Filled 2016-05-01: qty 1

## 2016-05-04 ENCOUNTER — Other Ambulatory Visit: Payer: Self-pay

## 2016-05-04 ENCOUNTER — Inpatient Hospital Stay: Payer: Medicare Other

## 2016-05-04 VITALS — BP 117/67 | HR 72

## 2016-05-04 DIAGNOSIS — I129 Hypertensive chronic kidney disease with stage 1 through stage 4 chronic kidney disease, or unspecified chronic kidney disease: Secondary | ICD-10-CM | POA: Diagnosis not present

## 2016-05-04 DIAGNOSIS — D571 Sickle-cell disease without crisis: Secondary | ICD-10-CM

## 2016-05-04 DIAGNOSIS — D631 Anemia in chronic kidney disease: Secondary | ICD-10-CM

## 2016-05-04 DIAGNOSIS — N189 Chronic kidney disease, unspecified: Principal | ICD-10-CM

## 2016-05-04 DIAGNOSIS — N184 Chronic kidney disease, stage 4 (severe): Principal | ICD-10-CM

## 2016-05-04 LAB — SAMPLE TO BLOOD BANK

## 2016-05-04 LAB — HEMATOCRIT: HCT: 17.7 % — ABNORMAL LOW (ref 35.0–47.0)

## 2016-05-04 LAB — HEMOGLOBIN: HEMOGLOBIN: 6.2 g/dL — AB (ref 12.0–16.0)

## 2016-05-04 MED ORDER — EPOETIN ALFA 40000 UNIT/ML IJ SOLN
40000.0000 [IU] | Freq: Once | INTRAMUSCULAR | Status: AC
  Administered 2016-05-04: 40000 [IU] via SUBCUTANEOUS
  Filled 2016-05-04: qty 1

## 2016-05-06 ENCOUNTER — Inpatient Hospital Stay: Payer: Medicare Other

## 2016-05-06 VITALS — BP 129/56 | HR 74

## 2016-05-06 DIAGNOSIS — D631 Anemia in chronic kidney disease: Secondary | ICD-10-CM

## 2016-05-06 DIAGNOSIS — I129 Hypertensive chronic kidney disease with stage 1 through stage 4 chronic kidney disease, or unspecified chronic kidney disease: Secondary | ICD-10-CM | POA: Diagnosis not present

## 2016-05-06 DIAGNOSIS — N189 Chronic kidney disease, unspecified: Principal | ICD-10-CM

## 2016-05-06 MED ORDER — EPOETIN ALFA 40000 UNIT/ML IJ SOLN
40000.0000 [IU] | Freq: Once | INTRAMUSCULAR | Status: AC
  Administered 2016-05-06: 40000 [IU] via SUBCUTANEOUS
  Filled 2016-05-06: qty 1

## 2016-05-07 ENCOUNTER — Ambulatory Visit (INDEPENDENT_AMBULATORY_CARE_PROVIDER_SITE_OTHER): Payer: Medicare Other | Admitting: Internal Medicine

## 2016-05-07 ENCOUNTER — Encounter: Payer: Self-pay | Admitting: Internal Medicine

## 2016-05-07 VITALS — BP 132/70 | HR 72 | Wt 194.0 lb

## 2016-05-07 DIAGNOSIS — G4730 Sleep apnea, unspecified: Secondary | ICD-10-CM

## 2016-05-07 DIAGNOSIS — J449 Chronic obstructive pulmonary disease, unspecified: Secondary | ICD-10-CM | POA: Diagnosis not present

## 2016-05-07 NOTE — Addendum Note (Signed)
Addended by: Maryanna Shape A on: 05/07/2016 11:32 AM   Modules accepted: Orders

## 2016-05-07 NOTE — Progress Notes (Signed)
Date: 05/07/2016,   MRN# [id number] Jane Short November 21, 1950 Code Status:  Hosp day:@LENGTHOFSTAYDAYS @ Referring MD: @ATDPROV @     PCP:      AdmissionWeight: 194 lb (88 kg)                 CurrentWeight: 194 lb (88 kg) Jane Short is a 65 y.o. old female seen in consultation for chronic SOB and cough.     CHIEF COMPLAINT:   Follow up Chronic cough and SOB, COPD   HISTORY OF PRESENT ILLNESS  H/o  Vtach/heart failure and Endocarditis   Patient stopped dulera can not afford and not taking at this time Patient has chronic SOB, no fevers, chills, no signs of infection at this time   No signs of infection at this time 100% compliance of CPAP therapy doing well    PFT 11/13/2015 Ration WNL, FVC 2.8L 50% Fef25/75 2.1L 47% DLCO 50% Pos BD response Assessment-mild small airways obstructive disease, moderate restrictive lung disease      Current Medication:   ALLERGIES   Ramipril and Septra [sulfamethoxazole-trimethoprim]     REVIEW OF SYSTEMS   Review of Systems  Constitutional: Negative for chills, diaphoresis, fever and malaise/fatigue.  HENT: Negative for congestion.   Eyes: Negative for blurred vision.  Respiratory: Positive for shortness of breath. Negative for cough, hemoptysis, sputum production and wheezing.   Cardiovascular: Positive for leg swelling. Negative for chest pain and orthopnea.  Gastrointestinal: Negative for abdominal pain, heartburn and nausea.  Musculoskeletal: Positive for joint pain.  Neurological: Negative for dizziness.  Psychiatric/Behavioral: Negative for depression. The patient is not nervous/anxious.   All other systems reviewed and are negative.    BP 132/70 (BP Location: Right Arm, Cuff Size: Normal)   Pulse 72   Wt 194 lb (88 kg)   SpO2 92%   BMI 30.38 kg/m     PHYSICAL EXAM  Physical Exam  Constitutional: She is oriented to person, place, and time. No distress.  Neck: Neck supple.  Cardiovascular: Normal  rate, regular rhythm and normal heart sounds.   No murmur heard. Pulmonary/Chest: Effort normal and breath sounds normal. No respiratory distress. She has no wheezes. She has no rales.  Musculoskeletal: Normal range of motion. She exhibits no edema.  Neurological: She is alert and oriented to person, place, and time.  Skin: She is not diaphoretic.  Psychiatric: She has a normal mood and affect.       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   Overall, she is doing well with her resp status  1.will continue  BREO 200 daily 2. Albuterol as needed 3.CPAP  therapy for OSA. 4.continue oxygen 2 l [place] with exertion and at night, check ONO 5.follow up hem onc as needed   Overall, patient has low-moderate risk for post op pulmonary complications. Patient understands SHe quit cigarettes last year but smokes E-cigs-smoking cessation advised Recommend Incentive spirometry post op, oxygen as needed, BD therapy with Neb therapy if needed   Follow up in 6 months   The Patient requires high complexity decision making for assessment and support, frequent evaluation and titration of therapies, application of advanced monitoring technologies and extensive interpretation of multiple databases.  Patient is satisfied with Plan of action and management.   Corrin Parker, M.D.  [facility]  Medical Director [place] [facility] Cardio-Pulmonary Department

## 2016-05-07 NOTE — Patient Instructions (Addendum)
Continue CPAP therapy and Breo as prescribed Check for oxygen at night

## 2016-05-08 ENCOUNTER — Inpatient Hospital Stay: Payer: Medicare Other

## 2016-05-08 VITALS — BP 113/62 | HR 71

## 2016-05-08 DIAGNOSIS — N189 Chronic kidney disease, unspecified: Principal | ICD-10-CM

## 2016-05-08 DIAGNOSIS — D631 Anemia in chronic kidney disease: Secondary | ICD-10-CM

## 2016-05-08 MED ORDER — EPOETIN ALFA 40000 UNIT/ML IJ SOLN
40000.0000 [IU] | Freq: Once | INTRAMUSCULAR | Status: AC
  Administered 2016-05-08: 40000 [IU] via SUBCUTANEOUS
  Filled 2016-05-08: qty 1

## 2016-05-11 ENCOUNTER — Inpatient Hospital Stay: Admit: 2016-05-11 | Payer: Self-pay | Admitting: Internal Medicine

## 2016-05-11 ENCOUNTER — Inpatient Hospital Stay (HOSPITAL_BASED_OUTPATIENT_CLINIC_OR_DEPARTMENT_OTHER): Payer: Medicare Other | Admitting: Internal Medicine

## 2016-05-11 ENCOUNTER — Inpatient Hospital Stay: Payer: Medicare Other

## 2016-05-11 ENCOUNTER — Other Ambulatory Visit: Payer: Self-pay | Admitting: *Deleted

## 2016-05-11 ENCOUNTER — Encounter: Payer: Self-pay | Admitting: Internal Medicine

## 2016-05-11 ENCOUNTER — Other Ambulatory Visit: Payer: Self-pay

## 2016-05-11 ENCOUNTER — Inpatient Hospital Stay
Admission: AD | Admit: 2016-05-11 | Discharge: 2016-05-13 | DRG: 812 | Disposition: A | Discharge status: Other Acute Inpatient Hospital | Payer: Medicare Other | Source: Ambulatory Visit | Attending: Internal Medicine | Admitting: Internal Medicine

## 2016-05-11 VITALS — BP 124/53 | HR 84 | Temp 97.2°F

## 2016-05-11 DIAGNOSIS — M109 Gout, unspecified: Secondary | ICD-10-CM

## 2016-05-11 DIAGNOSIS — E875 Hyperkalemia: Secondary | ICD-10-CM | POA: Diagnosis present

## 2016-05-11 DIAGNOSIS — R0902 Hypoxemia: Secondary | ICD-10-CM | POA: Diagnosis present

## 2016-05-11 DIAGNOSIS — I132 Hypertensive heart and chronic kidney disease with heart failure and with stage 5 chronic kidney disease, or end stage renal disease: Secondary | ICD-10-CM | POA: Diagnosis present

## 2016-05-11 DIAGNOSIS — Z888 Allergy status to other drugs, medicaments and biological substances status: Secondary | ICD-10-CM

## 2016-05-11 DIAGNOSIS — L439 Lichen planus, unspecified: Secondary | ICD-10-CM

## 2016-05-11 DIAGNOSIS — D631 Anemia in chronic kidney disease: Secondary | ICD-10-CM

## 2016-05-11 DIAGNOSIS — J449 Chronic obstructive pulmonary disease, unspecified: Secondary | ICD-10-CM | POA: Diagnosis present

## 2016-05-11 DIAGNOSIS — Z87891 Personal history of nicotine dependence: Secondary | ICD-10-CM | POA: Diagnosis not present

## 2016-05-11 DIAGNOSIS — R809 Proteinuria, unspecified: Secondary | ICD-10-CM | POA: Diagnosis present

## 2016-05-11 DIAGNOSIS — D649 Anemia, unspecified: Secondary | ICD-10-CM | POA: Diagnosis present

## 2016-05-11 DIAGNOSIS — D5700 Hb-SS disease with crisis, unspecified: Secondary | ICD-10-CM | POA: Diagnosis present

## 2016-05-11 DIAGNOSIS — M6281 Muscle weakness (generalized): Secondary | ICD-10-CM

## 2016-05-11 DIAGNOSIS — D509 Iron deficiency anemia, unspecified: Secondary | ICD-10-CM | POA: Diagnosis present

## 2016-05-11 DIAGNOSIS — K761 Chronic passive congestion of liver: Secondary | ICD-10-CM | POA: Diagnosis present

## 2016-05-11 DIAGNOSIS — I5022 Chronic systolic (congestive) heart failure: Secondary | ICD-10-CM | POA: Diagnosis present

## 2016-05-11 DIAGNOSIS — I4891 Unspecified atrial fibrillation: Secondary | ICD-10-CM

## 2016-05-11 DIAGNOSIS — N184 Chronic kidney disease, stage 4 (severe): Secondary | ICD-10-CM

## 2016-05-11 DIAGNOSIS — I129 Hypertensive chronic kidney disease with stage 1 through stage 4 chronic kidney disease, or unspecified chronic kidney disease: Secondary | ICD-10-CM | POA: Diagnosis not present

## 2016-05-11 DIAGNOSIS — D571 Sickle-cell disease without crisis: Secondary | ICD-10-CM

## 2016-05-11 DIAGNOSIS — Z833 Family history of diabetes mellitus: Secondary | ICD-10-CM | POA: Diagnosis not present

## 2016-05-11 DIAGNOSIS — Z8249 Family history of ischemic heart disease and other diseases of the circulatory system: Secondary | ICD-10-CM

## 2016-05-11 DIAGNOSIS — M818 Other osteoporosis without current pathological fracture: Secondary | ICD-10-CM

## 2016-05-11 DIAGNOSIS — D151 Benign neoplasm of heart: Secondary | ICD-10-CM | POA: Diagnosis present

## 2016-05-11 DIAGNOSIS — M25552 Pain in left hip: Secondary | ICD-10-CM | POA: Diagnosis present

## 2016-05-11 DIAGNOSIS — E559 Vitamin D deficiency, unspecified: Secondary | ICD-10-CM

## 2016-05-11 DIAGNOSIS — N189 Chronic kidney disease, unspecified: Secondary | ICD-10-CM

## 2016-05-11 DIAGNOSIS — M810 Age-related osteoporosis without current pathological fracture: Secondary | ICD-10-CM | POA: Diagnosis present

## 2016-05-11 DIAGNOSIS — R262 Difficulty in walking, not elsewhere classified: Secondary | ICD-10-CM

## 2016-05-11 DIAGNOSIS — D696 Thrombocytopenia, unspecified: Secondary | ICD-10-CM

## 2016-05-11 DIAGNOSIS — Z803 Family history of malignant neoplasm of breast: Secondary | ICD-10-CM

## 2016-05-11 DIAGNOSIS — Z79899 Other long term (current) drug therapy: Secondary | ICD-10-CM

## 2016-05-11 DIAGNOSIS — Z9049 Acquired absence of other specified parts of digestive tract: Secondary | ICD-10-CM

## 2016-05-11 DIAGNOSIS — R0602 Shortness of breath: Secondary | ICD-10-CM

## 2016-05-11 DIAGNOSIS — M25559 Pain in unspecified hip: Secondary | ICD-10-CM | POA: Diagnosis present

## 2016-05-11 DIAGNOSIS — N185 Chronic kidney disease, stage 5: Secondary | ICD-10-CM | POA: Diagnosis present

## 2016-05-11 DIAGNOSIS — I509 Heart failure, unspecified: Secondary | ICD-10-CM

## 2016-05-11 LAB — COMPREHENSIVE METABOLIC PANEL
ALK PHOS: 150 U/L — AB (ref 38–126)
ALT: 15 U/L (ref 14–54)
AST: 47 U/L — AB (ref 15–41)
Albumin: 3.4 g/dL — ABNORMAL LOW (ref 3.5–5.0)
Anion gap: 11 (ref 5–15)
BUN: 80 mg/dL — ABNORMAL HIGH (ref 6–20)
CALCIUM: 9.2 mg/dL (ref 8.9–10.3)
CO2: 21 mmol/L — AB (ref 22–32)
CREATININE: 4.06 mg/dL — AB (ref 0.44–1.00)
Chloride: 105 mmol/L (ref 101–111)
GFR calc Af Amer: 12 mL/min — ABNORMAL LOW (ref >60)
GFR calc non Af Amer: 11 mL/min — ABNORMAL LOW (ref >60)
GLUCOSE: 101 mg/dL — AB (ref 65–99)
Potassium: 5.8 mmol/L — ABNORMAL HIGH (ref 3.5–5.1)
SODIUM: 137 mmol/L (ref 135–145)
Total Bilirubin: 13 mg/dL — ABNORMAL HIGH (ref 0.3–1.2)
Total Protein: 7.2 g/dL (ref 6.5–8.1)

## 2016-05-11 LAB — HEMATOCRIT: HCT: 14.3 % — CL (ref 35.0–47.0)

## 2016-05-11 LAB — RETICULOCYTES
RBC.: 1.49 MIL/uL — ABNORMAL LOW (ref 3.80–5.20)
Retic Count, Absolute: 257.8 10*3/uL — ABNORMAL HIGH (ref 19.0–183.0)
Retic Ct Pct: 17.3 % — ABNORMAL HIGH (ref 0.4–3.1)

## 2016-05-11 LAB — LACTATE DEHYDROGENASE: LDH: 901 U/L — ABNORMAL HIGH (ref 98–192)

## 2016-05-11 LAB — PREPARE RBC (CROSSMATCH)

## 2016-05-11 LAB — SAMPLE TO BLOOD BANK

## 2016-05-11 LAB — HEMOGLOBIN: Hemoglobin: 5 g/dL — ABNORMAL LOW (ref 12.0–16.0)

## 2016-05-11 MED ORDER — ACETAMINOPHEN 325 MG PO TABS: 650.0000 mg | ORAL_TABLET | Freq: Four times a day (QID) | ORAL | Status: DC | PRN

## 2016-05-11 MED ORDER — FUROSEMIDE 10 MG/ML IJ SOLN
20.0000 mg | Freq: Once | INTRAMUSCULAR | Status: AC
  Administered 2016-05-11: 20 mg via INTRAVENOUS
  Filled 2016-05-11: qty 2

## 2016-05-11 MED ORDER — CALCITRIOL 0.25 MCG PO CAPS
0.2500 ug | ORAL_CAPSULE | Freq: Every day | ORAL | Status: DC
Start: 2016-05-11 — End: 2016-05-13
  Administered 2016-05-12 – 2016-05-13 (×2): 0.25 ug via ORAL
  Filled 2016-05-11 (×2): qty 1

## 2016-05-11 MED ORDER — HEPARIN SOD (PORK) LOCK FLUSH 100 UNIT/ML IV SOLN: 500.0000 [IU] | Freq: Every day | INTRAVENOUS | Status: DC | PRN

## 2016-05-11 MED ORDER — ONDANSETRON HCL 4 MG/2ML IJ SOLN
4.0000 mg | Freq: Once | INTRAMUSCULAR | Status: AC
  Administered 2016-05-11: 4 mg via INTRAVENOUS
  Filled 2016-05-11: qty 2

## 2016-05-11 MED ORDER — FUROSEMIDE 10 MG/ML IJ SOLN
20.0000 mg | Freq: Once | INTRAMUSCULAR | Status: AC
  Administered 2016-05-11: 20 mg via INTRAVENOUS

## 2016-05-11 MED ORDER — SODIUM CHLORIDE 0.9% FLUSH: 3.0000 mL | INTRAVENOUS | Status: DC | PRN

## 2016-05-11 MED ORDER — SODIUM CHLORIDE 0.9 % IV SOLN
Freq: Once | INTRAVENOUS | Status: AC
  Administered 2016-05-11: 15:00:00 via INTRAVENOUS

## 2016-05-11 MED ORDER — HEPARIN SOD (PORK) LOCK FLUSH 100 UNIT/ML IV SOLN: 250.0000 [IU] | INTRAVENOUS | Status: DC | PRN

## 2016-05-11 MED ORDER — SODIUM CHLORIDE 0.9% FLUSH: 10.0000 mL | INTRAVENOUS | Status: DC | PRN

## 2016-05-11 MED ORDER — FOLIC ACID 1 MG PO TABS
1.0000 mg | ORAL_TABLET | Freq: Every day | ORAL | Status: DC
  Administered 2016-05-11 – 2016-05-13 (×3): 1 mg via ORAL
  Filled 2016-05-11 (×3): qty 1

## 2016-05-11 MED ORDER — AMIODARONE HCL 200 MG PO TABS
100.0000 mg | ORAL_TABLET | Freq: Every day | ORAL | Status: DC
  Administered 2016-05-12: 10:00:00 100 mg via ORAL
  Filled 2016-05-11 (×2): qty 1

## 2016-05-11 MED ORDER — SODIUM CHLORIDE 0.9 % IV SOLN
250.0000 mL | Freq: Once | INTRAVENOUS | Status: AC
  Administered 2016-05-11: 250 mL via INTRAVENOUS

## 2016-05-11 MED ORDER — SENNOSIDES-DOCUSATE SODIUM 8.6-50 MG PO TABS: 1.0000 | ORAL_TABLET | Freq: Every evening | ORAL | Status: DC | PRN

## 2016-05-11 MED ORDER — ACETAMINOPHEN 325 MG PO TABS
650.0000 mg | ORAL_TABLET | Freq: Once | ORAL | Status: AC
  Administered 2016-05-11: 650 mg via ORAL
  Filled 2016-05-11: qty 2

## 2016-05-11 MED ORDER — SODIUM BICARBONATE 650 MG PO TABS
650.0000 mg | ORAL_TABLET | Freq: Two times a day (BID) | ORAL | Status: DC
  Administered 2016-05-11 – 2016-05-13 (×4): 650 mg via ORAL
  Filled 2016-05-11 (×5): qty 1

## 2016-05-11 MED ORDER — FUROSEMIDE 10 MG/ML IJ SOLN
20.0000 mg | Freq: Once | INTRAMUSCULAR | Status: DC
  Filled 2016-05-11: qty 2

## 2016-05-11 MED ORDER — ONDANSETRON HCL 4 MG/2ML IJ SOLN: 4.0000 mg | Freq: Four times a day (QID) | INTRAMUSCULAR | Status: DC | PRN

## 2016-05-11 MED ORDER — ACETAMINOPHEN 650 MG RE SUPP
650.0000 mg | Freq: Four times a day (QID) | RECTAL | Status: DC | PRN
Start: 2016-05-11 — End: 2016-05-13

## 2016-05-11 MED ORDER — FLUTICASONE FUROATE-VILANTEROL 200-25 MCG/INH IN AEPB
1.0000 | INHALATION_SPRAY | Freq: Every day | RESPIRATORY_TRACT | Status: DC
  Administered 2016-05-11 – 2016-05-13 (×3): 1 via RESPIRATORY_TRACT
  Filled 2016-05-11: qty 28

## 2016-05-11 MED ORDER — ONDANSETRON HCL 4 MG PO TABS
4.0000 mg | ORAL_TABLET | Freq: Four times a day (QID) | ORAL | Status: DC | PRN
Start: 2016-05-11 — End: 2016-05-13

## 2016-05-11 MED ORDER — PNEUMOCOCCAL 13-VAL CONJ VACC IM SUSP
0.5000 mL | INTRAMUSCULAR | Status: DC
  Filled 2016-05-11: qty 0.5

## 2016-05-11 MED ORDER — HYDROXYUREA 500 MG PO CAPS
500.0000 mg | ORAL_CAPSULE | Freq: Every day | ORAL | Status: DC
  Administered 2016-05-12 – 2016-05-13 (×2): 500 mg via ORAL
  Filled 2016-05-11 (×2): qty 1

## 2016-05-11 MED ORDER — HYDROCODONE-ACETAMINOPHEN 5-325 MG PO TABS
1.0000 | ORAL_TABLET | ORAL | Status: DC | PRN
Start: 2016-05-11 — End: 2016-05-13
  Administered 2016-05-12: 2 via ORAL
  Administered 2016-05-12: 03:00:00 1 via ORAL
  Administered 2016-05-13: 2 via ORAL
  Filled 2016-05-11: qty 1
  Filled 2016-05-11 (×2): qty 2

## 2016-05-11 NOTE — Progress Notes (Signed)
Blood bank orders released, spoke with Kieth Brightly to notify of orders.  Patient to be admitted

## 2016-05-11 NOTE — Progress Notes (Signed)
Family Meeting Note  Advance Directive:no  Today a meeting took place with the Patient.and spouse    The following clinical team members were present during this meeting:MD  The following were discussed:Patient's diagnosis:anemia and left hip pain , Patient's progosis: Unable to determine and Goals for treatment: Full Code  Additional follow-up to be provided: once she moves back to [place] she will work on advance directives Her husband is her unoffical HC POA  Time spent during discussion:16 minutes  Jane Short, Ulice Bold, MD

## 2016-05-11 NOTE — Progress Notes (Signed)
Wanette, RN from 1C returned call for report/handoff. 1310. No volunteers in cancer ctr or main hp available at this time.  Called Jamie in orderly dept. He will come transport pt to room 114.

## 2016-05-11 NOTE — Progress Notes (Signed)
Pt stated that she does wear CPAP at home, she said she will not be bringing hers in and does not want to use ours but will call if she thinks she needs it tonight.

## 2016-05-11 NOTE — Assessment & Plan Note (Addendum)
#   SEVERE ANEMIA-Multifactorial chronic kidney disease/iron deficiency/sickle cell disease. Brisk bone marrow response  # Severe anemia hemoglobin today 5.0 / baseline appx 6.  On  Procrit 40,000 units Monday Wednesday Friday.; IV ferrahem as needed. I do not suspect the crisis today.  # Sickle cell disease- confirmed on hemoglobin electrophoresis. On folic acid once a day. On  Hydrea 500 mg once a day.  # Left hip pain question osteonecrosis of hip versus others. MRI versus a bone scan.   #  Chronic kidney disease/ acute renal failure- today creatinine is 3.5 [planning HD/fistula]- not on HD yet.   # Jaundice- secondary to congestive hepatopathy. Await the numbers in today.  # COPD/OSA/CHF [Dr.Kasa] on Home O2 -2L [place] prn  CPAP.   # Given the severe anemia; also left hip pain; overall feeling poorly- recommend admission the hospital. Spoke to Dr.Modi- who kindly agreed to admit the patient.  #    otherwise Patient will follow-up with me in approximately  3 months; continue procrit to three times a week.

## 2016-05-11 NOTE — Plan of Care (Signed)
Problem: Pain Managment: Goal: General experience of comfort will improve Outcome: Progressing Left hip pain.   Problem: Tissue Perfusion: Goal: Risk factors for ineffective tissue perfusion will decrease abraision areas on left  Lower leg. Anemic. To receive 2 units prbcs for hgb of 5.0

## 2016-05-11 NOTE — Progress Notes (Signed)
1022-critical value H&H (hgb: 5.0 and hct 14.3) called Renita Papa, RN by Collene Mares in cancer ctr lab. Read back process performed.   Dr. Rogue Bussing -MD informed-of critical value @ 1045 am. Read back process with md.

## 2016-05-11 NOTE — Progress Notes (Signed)
Patient here for evaluation. Per husband. Pain started to increase on Friday and got progressively worse. [place] of state ER [place] Saturday for evaluation. She was prescribed morphine. She experienced an allergic reaction to morphine on Sunday night. Patient is lethargic and sleeping through out assessment.

## 2016-05-11 NOTE — Progress Notes (Signed)
[place] OFFICE PROGRESS NOTE  Patient Care Team: Leone Haven, MD as PCP - General (Family Medicine) Seeplaputhur Robinette Haines, MD (General Surgery) Rubbie Battiest, NP as Nurse Practitioner (Gerontology) Flora Lipps, MD as Consulting Physician (Pulmonary Disease) Yolonda Kida, MD as Consulting Physician (Cardiology)   SUMMARY OF ONCOLOGIC HISTORY:  # Severe Anemia- multifactorial [as per Pt baseline 6-6.5] SCD/CKD- on procrit 40K/IV Iron; May 2017- Procrit 40K W-F; July 2017-Start IV Ferrahem q2W; SEP 2017- M-W-F    # SICKLE CELL ANEMIA/ Congestive hepatopathy [Dec 2016- Bili 60s s/p liver bx; [facility]]; May 2017- RestartHydrea 500 qD  # Acute renal failure/ CKD [Dr.Kolluru]/ CHF [Dr.]  # Jan 2017 ? Endocarditis s/p IV Abx/ A. Fib [not on anti-coag]   INTERVAL HISTORY:  65 year old female patient with above history of sickle cell anemia chronic kidney disease/CHF- is here for follow-up- anemia..  Patient just returned back from [place] after trip. [place] approximately 3 days ago she was in the emergency room for left hip pain received morphine pain medication. Her hemoglobin was 6. She did not get any blood transfusion. She got back home last night; she continues to have worsening hip pain. It radiates the left leg.  She continues to be tired. Easily short of breath. No chest pain. No fevers. Denies any swelling in the legs. Patient is not on dialysis yet. She is awaiting resolution of the fistula.  However her hemoglobin has been more steady since increasing the Procrit to 40,000 units 3 times a week.  REVIEW OF SYSTEMS:  A complete 10 point review of system is done which is negative except mentioned above/history of present illness.   PAST MEDICAL HISTORY :  Past Medical History:  Diagnosis Date  . Anemia   . 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 Cheree Ditto at [facility]  . CHF (congestive heart failure) ([place])   . Congestive heart failure (CHF) ([place])   .  Congestive heart failure (CHF) ([place]) 06/2015  . COPD (chronic obstructive pulmonary disease) (HCC)    symptoms Dr. Patricia Pesa   . Gout   . Hypertension   . Lichen planus   . Osteoporosis 2011   osteopenia  . SCA-1 (spinocerebellar ataxia type 1) ([place])   . Sickle cell anemia (HCC)    sickle cell disease  . Sickle cell anemia (HCC)   . Thrombocytopenia ([place])   . Vitamin D deficiency     PAST SURGICAL HISTORY :   Past Surgical History:  Procedure Laterality Date  . AV FISTULA PLACEMENT Left 02/27/2016   Procedure: INSERTION OF ARTERIOVENOUS (AV) GORE-TEX GRAFT ARM ( FOREARM LOOP );  Surgeon: Algernon Huxley, MD;  Location: [facility] ORS;  Service: Vascular;  Laterality: Left;  . CHOLECYSTECTOMY    . COLONOSCOPY  2014   [facility]  . EYE SURGERY     cataract bilateral  . GALLBLADDER SURGERY    . PERIPHERAL VASCULAR CATHETERIZATION Left 06/14/2015   Procedure: Dialysis/Perma Catheter Insertion;  Surgeon: Katha Cabal, MD;  Location: [place] [facility];  Service: Cardiovascular;  Laterality: Left;  . PERIPHERAL VASCULAR CATHETERIZATION N/A 01/23/2016   Procedure: Glori Luis Cath Insertion;  Surgeon: Algernon Huxley, MD;  Location: [place] [facility];  Service: Cardiovascular;  Laterality: N/A;  . PERIPHERAL VASCULAR CATHETERIZATION Left 03/30/2016   Procedure: A/V Shuntogram/Fistulagram;  Surgeon: Algernon Huxley, MD;  Location: [place] [facility];  Service: Cardiovascular;  Laterality: Left;  . TUBAL LIGATION  FAMILY HISTORY :   Family History  Problem Relation Age of Onset  . Heart disease Father   . Diabetes Father   . Diabetes Sister   . Cancer Mother     breast cancer  . Cancer Maternal Aunt     Breast cancer    SOCIAL HISTORY:   Social History  Substance Use Topics  . Smoking status: Former Smoker    Packs/day: 0.25    Years: 30.00    Types: Cigarettes    Quit date: 04/29/2015  . Smokeless tobacco: Never Used     Comment: quit 1 month ago  . Alcohol use No    ALLERGIES:   is allergic to ramipril and septra [sulfamethoxazole-trimethoprim].  MEDICATIONS:  Current Outpatient Prescriptions  Medication Sig Dispense Refill  . amiodarone (PACERONE) 100 MG tablet Take 100 mg by mouth daily.    . calcitRIOL (ROCALTROL) 0.25 MCG capsule Take 0.25 mcg by mouth daily.    Marland Kitchen epoetin alfa (EPOGEN,PROCRIT) 16109 UNIT/ML injection Inject into the skin 3 (three) times a week.    . fluticasone furoate-vilanterol (BREO ELLIPTA) 200-25 MCG/INH AEPB Inhale 1 puff into the lungs daily. 60 each 5  . folic acid (FOLVITE) 1 MG tablet Take 1 mg by mouth daily.    . furosemide (LASIX) 80 MG tablet Take 1 tablet (80 mg total) by mouth 2 (two) times daily. (Patient taking differently: Take 80 mg by mouth daily. May take an additional 80mg s as needed for fluid) 30 tablet 0  . hydroxyurea (HYDREA) 500 MG capsule Take 1 capsule (500 mg total) by mouth daily. May take with food to minimize GI side effects. 60 capsule 3  . iron dextran complex in sodium chloride 0.9 % 500 mL Inject into the vein.    Marland Kitchen morphine (MSIR) 15 MG tablet Take 15 mg by mouth.    . naproxen sodium (ANAPROX) 220 MG tablet Take 440 mg by mouth 2 (two) times daily as needed (pain).    . OXYGEN Inhale into the lungs.    . sodium bicarbonate 650 MG tablet Take 650 mg by mouth 2 (two) times daily.      No current facility-administered medications for this visit.    Facility-Administered Medications Ordered in Other Visits  Medication Dose Route Frequency Provider Last Rate Last Dose  . 0.9 %  sodium chloride infusion  250 mL Intravenous Once Cammie Sickle, MD      . acetaminophen (TYLENOL) tablet 650 mg  650 mg Oral Once Cammie Sickle, MD      . acetaminophen (TYLENOL) tablet 650 mg  650 mg Oral Once Cammie Sickle, MD      . diphenhydrAMINE (BENADRYL) capsule 25 mg  25 mg Oral Once Cammie Sickle, MD      . diphenhydrAMINE (BENADRYL) capsule 25 mg  25 mg Oral Once Cammie Sickle, MD      .  furosemide (LASIX) injection 20 mg  20 mg Intravenous Once Cammie Sickle, MD      . furosemide (LASIX) injection 20 mg  20 mg Intravenous Once Cammie Sickle, MD      . heparin lock flush 100 unit/mL  250 Units Intracatheter PRN Leia Alf, MD      . heparin lock flush 100 unit/mL  500 Units Intracatheter Daily PRN Cammie Sickle, MD      . heparin lock flush 100 unit/mL  250 Units Intracatheter PRN Cammie Sickle, MD      .  heparin lock flush 100 unit/mL  500 Units Intracatheter Daily PRN Cammie Sickle, MD      . heparin lock flush 100 unit/mL  250 Units Intracatheter PRN Cammie Sickle, MD      . sodium chloride flush (NS) 0.9 % injection 10 mL  10 mL Intracatheter PRN Cammie Sickle, MD      . sodium chloride flush (NS) 0.9 % injection 10 mL  10 mL Intracatheter PRN Cammie Sickle, MD      . sodium chloride flush (NS) 0.9 % injection 3 mL  3 mL Intracatheter PRN Cammie Sickle, MD      . sodium chloride flush (NS) 0.9 % injection 3 mL  3 mL Intracatheter PRN Cammie Sickle, MD        PHYSICAL EXAMINATION:   BP (!) 124/53 (BP Location: Left Arm, Patient Position: Sitting)   Pulse 84   Temp 97.2 F (36.2 C) (Oral)   Filed Weights    GENERAL: Well-nourished well-developed; sleepy, no distress and comfortable.  With her husband.She is in a wheelchair. Accompanied by her husband.  EYES: positive for pallor/positive for icterus. OROPHARYNX: no thrush or ulceration; good dentition  NECK: supple, no masses felt LYMPH:  no palpable lymphadenopathy in the cervical, axillary or inguinal regions LUNGS: decreased breath sounds to auscultation and  No wheeze or crackles HEART/CVS: regular rate & rhythm and no murmurs; No lower extremity edema ABDOMEN:abdomen soft, non-tender and normal bowel sounds Musculoskeletal:no cyanosis of digits and no clubbing; PSYCH: alert & oriented x 3 with fluent speech NEURO: no focal motor/sensory  deficits SKIN: lichen planus lesions on the Legs bil [>15 years per pt]  LABORATORY DATA:  I have reviewed the data as listed    Component Value Date/Time   NA 135 04/20/2016 0929   NA 138 06/20/2012 1400   K 5.8 (H) 04/20/2016 0929   K 4.3 06/20/2012 1400   CL 108 04/20/2016 0929   CL 105 06/20/2012 1400   CO2 22 04/20/2016 0929   CO2 24 06/20/2012 1400   GLUCOSE 82 04/20/2016 0929   GLUCOSE 102 (H) 06/20/2012 1400   BUN 56 (H) 04/20/2016 0929   BUN 13 06/20/2012 1400   CREATININE 3.02 (H) 04/20/2016 0929   CREATININE 1.36 (H) 03/22/2013 1522   CALCIUM 9.1 04/20/2016 0929   CALCIUM 8.4 (L) 12/23/2015 1039   PROT 6.8 04/20/2016 0929   PROT 7.1 03/22/2013 1522   ALBUMIN 3.6 04/20/2016 0929   ALBUMIN 3.7 03/22/2013 1522   AST 44 (H) 04/20/2016 0929   AST 48 (H) 03/22/2013 1522   ALT 23 04/20/2016 0929   ALT 25 03/22/2013 1522   ALKPHOS 201 (H) 04/20/2016 0929   ALKPHOS 139 (H) 03/22/2013 1522   BILITOT 6.0 (H) 04/20/2016 0929   BILITOT 4.0 (H) 03/22/2013 1522   GFRNONAA 15 (L) 04/20/2016 0929   GFRNONAA 42 (L) 03/22/2013 1522   GFRAA 18 (L) 04/20/2016 0929   GFRAA 48 (L) 03/22/2013 1522    No results found for: SPEP, UPEP  Lab Results  Component Value Date   WBC 6.9 04/20/2016   NEUTROABS 3.8 04/20/2016   HGB 5.0 (L) 05/11/2016   HCT 14.3 (LL) 05/11/2016   MCV 102.9 (H) 04/20/2016   PLT 129 (L) 04/20/2016      Chemistry      Component Value Date/Time   NA 135 04/20/2016 0929   NA 138 06/20/2012 1400   K 5.8 (H) 04/20/2016 0929   K 4.3  06/20/2012 1400   CL 108 04/20/2016 0929   CL 105 06/20/2012 1400   CO2 22 04/20/2016 0929   CO2 24 06/20/2012 1400   BUN 56 (H) 04/20/2016 0929   BUN 13 06/20/2012 1400   CREATININE 3.02 (H) 04/20/2016 0929   CREATININE 1.36 (H) 03/22/2013 1522   GLU 89 02/20/2010      Component Value Date/Time   CALCIUM 9.1 04/20/2016 0929   CALCIUM 8.4 (L) 12/23/2015 1039   ALKPHOS 201 (H) 04/20/2016 0929   ALKPHOS 139 (H)  03/22/2013 1522   AST 44 (H) 04/20/2016 0929   AST 48 (H) 03/22/2013 1522   ALT 23 04/20/2016 0929   ALT 25 03/22/2013 1522   BILITOT 6.0 (H) 04/20/2016 0929   BILITOT 4.0 (H) 03/22/2013 1522         ASSESSMENT & PLAN:   Hb-SS disease without crisis ([place]) # SEVERE ANEMIA-Multifactorial chronic kidney disease/iron deficiency/sickle cell disease. Brisk bone marrow response  # Severe anemia hemoglobin today 5.0 / baseline appx 6.  On  Procrit 40,000 units Monday Wednesday Friday.; IV ferrahem as needed. I do not suspect the crisis today.  # Sickle cell disease- confirmed on hemoglobin electrophoresis. On folic acid once a day. On  Hydrea 500 mg once a day.  # Left hip pain question osteonecrosis of hip versus others. MRI versus a bone scan.   #  Chronic kidney disease/ acute renal failure- today creatinine is 3.5 [planning HD/fistula]- not on HD yet.   # Jaundice- secondary to congestive hepatopathy. Await the numbers in today.  # COPD/OSA/CHF [Dr.Kasa] on Home O2 -2L [place] prn  CPAP.   # Given the severe anemia; also left hip pain; overall feeling poorly- recommend admission the hospital. Spoke to Dr.Modi- who kindly agreed to admit the patient.  #    otherwise Patient will follow-up with me in approximately  3 months; continue procrit to three times a week.     Cammie Sickle, MD 05/11/2016 12:31 PM

## 2016-05-11 NOTE — H&P (Signed)
[facility] [place] NAME: Jane Short    MR#:  [id number]  DATE OF BIRTH:  August 04, 1950  DATE OF ADMISSION:  05/11/2016  PRIMARY CARE PHYSICIAN: Tommi Rumps, MD   REQUESTING/REFERRING PHYSICIAN: Dr. Tish Men  CHIEF COMPLAINT:   Anemia and left hip pain HISTORY OF PRESENT ILLNESS:  Jane Short  is a 65 y.o. female with a known history of Sickle cell disease and congestive hepatopathy with chronically elevated bilirubin who is directly evaluated from oncology services due to symptomatic anemia and left hip pain workup. Patient reports that she was in [place] over the weekend. She developed sudden onset of left hip pain and was seen in the emergency room and [place]. She was given morphine for the pain and then was discharged from the emergency room. She had no other workup. Patient denies fall or trauma to the left hip. She has had pain in the past and the left hip. The left hip pain does not radiate to her groin.  She is also complaining of shortness of breath and generalized weakness.  She is being evaluated in the hospital for anemia and left hip pain.  PAST MEDICAL HISTORY:   Past Medical History:  Diagnosis Date  . Anemia   . 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 Cheree Ditto at [facility]  . CHF (congestive heart failure) ([place])   . Congestive heart failure (CHF) ([place])   . Congestive heart failure (CHF) ([place]) 06/2015  . COPD (chronic obstructive pulmonary disease) (HCC)    symptoms Dr. Patricia Pesa   . Gout   . Hypertension   . Lichen planus   . Osteoporosis 2011   osteopenia  . SCA-1 (spinocerebellar ataxia type 1) ([place])   . Sickle cell anemia (HCC)    sickle cell disease  . Sickle cell anemia (HCC)   . Thrombocytopenia ([place])   . Vitamin D deficiency     PAST SURGICAL HISTORY:   Past Surgical History:  Procedure Laterality Date  . AV FISTULA PLACEMENT Left 02/27/2016   Procedure: INSERTION OF ARTERIOVENOUS (AV)  GORE-TEX GRAFT ARM ( FOREARM LOOP );  Surgeon: Algernon Huxley, MD;  Location: [facility] ORS;  Service: Vascular;  Laterality: Left;  . CHOLECYSTECTOMY    . COLONOSCOPY  2014   [facility]  . EYE SURGERY     cataract bilateral  . GALLBLADDER SURGERY    . PERIPHERAL VASCULAR CATHETERIZATION Left 06/14/2015   Procedure: Dialysis/Perma Catheter Insertion;  Surgeon: Katha Cabal, MD;  Location: [place] [facility];  Service: Cardiovascular;  Laterality: Left;  . PERIPHERAL VASCULAR CATHETERIZATION N/A 01/23/2016   Procedure: Glori Luis Cath Insertion;  Surgeon: Algernon Huxley, MD;  Location: [place] [facility];  Service: Cardiovascular;  Laterality: N/A;  . PERIPHERAL VASCULAR CATHETERIZATION Left 03/30/2016   Procedure: A/V Shuntogram/Fistulagram;  Surgeon: Algernon Huxley, MD;  Location: [place] [facility];  Service: Cardiovascular;  Laterality: Left;  . TUBAL LIGATION      SOCIAL HISTORY:   Social History  Substance Use Topics  . Smoking status: Former Smoker    Packs/day: 0.25    Years: 30.00    Types: Cigarettes    Quit date: 04/29/2015  . Smokeless tobacco: Never Used     Comment: quit 1 month ago  . Alcohol use No    FAMILY HISTORY:   Family History  Problem Relation Age of Onset  . Heart disease Father   . Diabetes Father   . Diabetes  Sister   . Cancer Mother     breast cancer  . Cancer Maternal Aunt     Breast cancer    DRUG ALLERGIES:   Allergies  Allergen Reactions  . Ramipril     cough  . Septra [Sulfamethoxazole-Trimethoprim]     Joint pain    REVIEW OF SYSTEMS:   Review of Systems  Constitutional: Positive for malaise/fatigue. Negative for chills and fever.  HENT: Negative.  Negative for ear discharge, ear pain, hearing loss, nosebleeds and sore throat.   Eyes: Negative.  Negative for blurred vision and pain.  Respiratory: Positive for shortness of breath. Negative for cough, hemoptysis and wheezing.   Cardiovascular: Negative.  Negative for chest pain, palpitations  and leg swelling.  Gastrointestinal: Negative.  Negative for abdominal pain, blood in stool, diarrhea, nausea and vomiting.  Genitourinary: Negative.  Negative for dysuria.  Musculoskeletal: Positive for joint pain. Negative for back pain.  Skin: Negative.        Jaundice Lichen planus on lower extremity  Neurological: Negative for dizziness, tremors, speech change, focal weakness, seizures and headaches.  Endo/Heme/Allergies: Negative.  Does not bruise/bleed easily.  Psychiatric/Behavioral: Negative.  Negative for depression, hallucinations and suicidal ideas.    MEDICATIONS AT HOME:   Prior to Admission medications   Medication Sig Start Date End Date Taking? Authorizing Provider  amiodarone (PACERONE) 100 MG tablet Take 100 mg by mouth daily.    Historical Provider, MD  calcitRIOL (ROCALTROL) 0.25 MCG capsule Take 0.25 mcg by mouth daily.    Historical Provider, MD  epoetin alfa (EPOGEN,PROCRIT) 96295 UNIT/ML injection Inject into the skin 3 (three) times a week.    Historical Provider, MD  fluticasone furoate-vilanterol (BREO ELLIPTA) 200-25 MCG/INH AEPB Inhale 1 puff into the lungs daily. 02/27/16   Flora Lipps, MD  folic acid (FOLVITE) 1 MG tablet Take 1 mg by mouth daily.    Historical Provider, MD  furosemide (LASIX) 80 MG tablet Take 1 tablet (80 mg total) by mouth 2 (two) times daily. Patient taking differently: Take 80 mg by mouth daily. May take an additional 80mg s as needed for fluid 09/01/15   Vaughan Basta, MD  hydroxyurea (HYDREA) 500 MG capsule Take 1 capsule (500 mg total) by mouth daily. May take with food to minimize GI side effects. 10/10/15   Cammie Sickle, MD  iron dextran complex in sodium chloride 0.9 % 500 mL Inject into the vein.    Historical Provider, MD  naproxen sodium (ANAPROX) 220 MG tablet Take 440 mg by mouth 2 (two) times daily as needed (pain).    Historical Provider, MD  OXYGEN Inhale into the lungs.    Historical Provider, MD  sodium  bicarbonate 650 MG tablet Take 650 mg by mouth 2 (two) times daily.     Historical Provider, MD      VITAL SIGNS:  Blood pressure (!) 115/39, pulse 62, resp. rate 18, SpO2 93 %.  PHYSICAL EXAMINATION:   Physical Exam  Constitutional: She is oriented to person, place, and time and well-developed, well-nourished, and in no distress. No distress.  HENT:  Head: Normocephalic.  Eyes: No scleral icterus.  Neck: Normal range of motion. Neck supple. No JVD present. No tracheal deviation present.  Cardiovascular: Normal rate and regular rhythm.  Exam reveals no gallop and no friction rub.   Murmur heard. Pulmonary/Chest: Effort normal and breath sounds normal. No respiratory distress. She has no wheezes. She has no rales. She exhibits no tenderness.  Abdominal: Soft. Bowel sounds  are normal. She exhibits no distension and no mass. There is no tenderness. There is no rebound and no guarding.  Musculoskeletal: Normal range of motion. She exhibits no edema.  Unable to move left hip without significant pain no tenderness at hip joint no edema  Neurological: She is alert and oriented to person, place, and time.  Skin: Skin is warm. No rash noted. No erythema.  Lichen planus on lower extremities  Psychiatric: Affect and judgment normal.      LABORATORY PANEL:   CBC  Recent Labs Lab 05/11/16 1010  HGB 5.0*  HCT 14.3*   ------------------------------------------------------------------------------------------------------------------  Chemistries  No results for input(s): NA, K, CL, CO2, GLUCOSE, BUN, CREATININE, CALCIUM, MG, AST, ALT, ALKPHOS, BILITOT in the last 168 hours.  Invalid input(s): GFRCGP ------------------------------------------------------------------------------------------------------------------  Cardiac Enzymes No results for input(s): TROPONINI in the last 168  hours. ------------------------------------------------------------------------------------------------------------------  RADIOLOGY:  No results found.  EKG:    IMPRESSION AND PLAN:   65 year old female with a history of sickle cell disease and chronic elevated bilirubin who presents from oncology office with symptomatic anemia and left hip pain  1. Severe Symptomatic anemia on chronic anemia: This is multifactorial due to chronic kidney disease/iron deficiency anemia and sickle cell disease. Patient has consented for blood transfusion. She will receive 2 unit PRBC  2. Left hip pain: Patient needs to be evaluated for osteonecrosis/hip fracture I will start with x-rays. Patient will most likely need bone scan or MRI to evaluate for osteonecrosis. Continue when necessary pain medications  3. Jaundice with chronically elevated bilirubin which has worsened due to chronic congestive hapatopathy Order reticulocyte count, LDH and compensated metabolic panel  4. Sickle cell disease: I spoke with oncologist Dr. Tish Men who did not believe patient was in sickle cell crisis. Continue Hydrea  5. OSA/COPD: Continue oxygen 2 L which she is on at home. Patient also has CPAP at night. Continue inhalers.   6. Chronic kidney disease stage III: I will consult nephrology for further assistance in management.   7. Lichen planus: Patient uses when necessary Clobestol.  8. Chronic systolic heart failure: Patient is euvolemic.   All the records are reviewed and case discussed with ED provider. Management plans discussed with the patient and she is in agreement  CODE STATUS: FULL  TOTAL TIME TAKING CARE OF THIS PATIENT: 45 minutes.    Kamariah Fruchter M.D on 05/11/2016 at 2:26 PM  Between 7am to 6pm - Pager - [phone]  After 6pm go to [email or web address] - password [email or web address] [facility]  Office  [phone]  CC: Primary care physician; Tommi Rumps, MD

## 2016-05-11 NOTE — Progress Notes (Signed)
1244--Called 1C for report hand off. Pt will be going to room 114 under c/o Dr. Benjie Karvonen.   Nurse currently not available. Phone # given to have RN call me as soon as she is available.

## 2016-05-12 ENCOUNTER — Ambulatory Visit: Payer: Medicare Other

## 2016-05-12 LAB — COMPREHENSIVE METABOLIC PANEL
ALBUMIN: 3.4 g/dL — AB (ref 3.5–5.0)
ALK PHOS: 142 U/L — AB (ref 38–126)
ALT: 12 U/L — ABNORMAL LOW (ref 14–54)
ANION GAP: 10 (ref 5–15)
AST: 40 U/L (ref 15–41)
BILIRUBIN TOTAL: 14.1 mg/dL — AB (ref 0.3–1.2)
BUN: 84 mg/dL — AB (ref 6–20)
CO2: 23 mmol/L (ref 22–32)
Calcium: 9 mg/dL (ref 8.9–10.3)
Chloride: 104 mmol/L (ref 101–111)
Creatinine, Ser: 4.25 mg/dL — ABNORMAL HIGH (ref 0.44–1.00)
GFR calc Af Amer: 12 mL/min — ABNORMAL LOW (ref >60)
GFR calc non Af Amer: 10 mL/min — ABNORMAL LOW (ref >60)
GLUCOSE: 97 mg/dL (ref 65–99)
POTASSIUM: 5.7 mmol/L — AB (ref 3.5–5.1)
SODIUM: 137 mmol/L (ref 135–145)
Total Protein: 7 g/dL (ref 6.5–8.1)

## 2016-05-12 LAB — TYPE AND SCREEN
ABO/RH(D): O POS
ANTIBODY SCREEN: NEGATIVE
UNIT DIVISION: 0
Unit division: 0

## 2016-05-12 LAB — CBC
HEMATOCRIT: 21.5 % — AB (ref 35.0–47.0)
HEMOGLOBIN: 7.6 g/dL — AB (ref 12.0–16.0)
MCH: 32.9 pg (ref 26.0–34.0)
MCHC: 35.2 g/dL (ref 32.0–36.0)
MCV: 93.5 fL (ref 80.0–100.0)
Platelets: 214 10*3/uL (ref 150–440)
RBC: 2.3 MIL/uL — ABNORMAL LOW (ref 3.80–5.20)
RDW: 28.8 % — AB (ref 11.5–14.5)
WBC: 12.5 10*3/uL — ABNORMAL HIGH (ref 3.6–11.0)

## 2016-05-12 MED ORDER — OXYCODONE-ACETAMINOPHEN 5-325 MG PO TABS
1.0000 | ORAL_TABLET | ORAL | Status: DC | PRN
  Administered 2016-05-12: 1 via ORAL
  Filled 2016-05-12: qty 1

## 2016-05-12 MED ORDER — METAXALONE 800 MG PO TABS
400.0000 mg | ORAL_TABLET | Freq: Three times a day (TID) | ORAL | Status: DC
  Administered 2016-05-12 – 2016-05-13 (×2): 400 mg via ORAL
  Filled 2016-05-12: qty 0.5
  Filled 2016-05-12 (×2): qty 1
  Filled 2016-05-12: qty 0.5

## 2016-05-12 MED ORDER — PATIROMER SORBITEX CALCIUM 8.4 G PO PACK
8.4000 g | PACK | Freq: Every day | ORAL | Status: DC
  Administered 2016-05-12: 8.4 g via ORAL
  Filled 2016-05-12 (×2): qty 4

## 2016-05-12 NOTE — Plan of Care (Signed)
Problem: Pain Managment: Goal: General experience of comfort will improve Outcome: Progressing Left hip pain, PRN norco given with improvement.  Problem: Activity: Goal: Risk for activity intolerance will decrease Outcome: Progressing 2 units of pRBC completed this shift, tolerated well, no adverse reaction noted, will continue to monitor.

## 2016-05-12 NOTE — Care Management Important Message (Signed)
Important Message  Patient Details  Name: Jane Short MRN: [id number] Date of Birth: 1950/09/16   Medicare Important Message Given:  Yes    Shelbie Ammons, RN 05/12/2016, 12:10 PM

## 2016-05-12 NOTE — Evaluation (Signed)
Physical Therapy Evaluation Patient Details Name: Jane Short MRN: [id number] DOB: June 11, 1950 Today's Date: 05/12/2016   History of Present Illness  Pt admitted for complaints of hip pain on L LE. Pt also with low Hgb, received blood transfusion yesterday. Pt now with elevated K+, however safe to mobilize with therapy, per RN. PMH includes sickle cell dx, congestive hepatopathy, and anemia. Pt also has home O2, 2L.  Clinical Impression  Pt is a pleasant 66 year old female who was admitted for L hip pain. All imaging negative at this point.  Pt performs transfers and ambulation with mod assist and RW. Pt extremely limited by pain. Pt demonstrates deficits with pain/strength/endurance/mobility. Pt is currently not at baseline level at this time. Would benefit from skilled PT to address above deficits and promote optimal return to PLOF; recommend transition to STR upon discharge from acute hospitalization.       Follow Up Recommendations SNF    Equipment Recommendations       Recommendations for Other Services       Precautions / Restrictions Precautions Precautions: Fall Restrictions Weight Bearing Restrictions: No      Mobility  Bed Mobility               General bed mobility comments: not performed as pt received in recliner, pt reports it took 2 assist for mobility  Transfers Overall transfer level: Needs assistance Equipment used: Rolling walker (2 wheeled) Transfers: Sit to/from Stand Sit to Stand: Mod assist         General transfer comment: Pt has difficulty leaning forward and scooting towards the edge of chair secondary to pain. Heavily guarded away from L hip. Increased assist for standing required. Once standing, heavy B UE WBing noted on rw.  Ambulation/Gait Ambulation/Gait assistance: Mod assist Ambulation Distance (Feet): 3 Feet Assistive device: Rolling walker (2 wheeled) Gait Pattern/deviations: Step-to pattern     General Gait Details:  heavy cues for ambulation in forward/backward pattern with cues for sequencing. Decreased WBing noted through L LE secondary to pain. Unable to further ambulate at this time secondary to pain.  Stairs            Wheelchair Mobility    Modified Rankin (Stroke Patients Only)       Balance Overall balance assessment: Needs assistance Sitting-balance support: Feet supported Sitting balance-Leahy Scale: Good     Standing balance support: Bilateral upper extremity supported Standing balance-Leahy Scale: Fair                               Pertinent Vitals/Pain Pain Assessment: 0-10 Pain Score: 10-Worst pain ever Pain Location: L hip with WBing Pain Descriptors / Indicators: Discomfort;Guarding;Grimacing Pain Intervention(s): Limited activity within patient's tolerance;Repositioned    Home Living Family/patient expects to be discharged to:: Private residence Living Arrangements: Spouse/significant other Available Help at Discharge: Family Type of Home: House Home Access: Stairs to enter Entrance Stairs-Rails: Can reach both Entrance Stairs-Number of Steps: 5 Home Layout: One level Home Equipment: Environmental consultant - 2 wheels;Cane - single point;Wheelchair - manual      Prior Function Level of Independence: Independent with assistive device(s)         Comments: reports she ambulates using SPC in home and uses WC for long distances     Hand Dominance        Extremity/Trunk Assessment   Upper Extremity Assessment: Overall WFL for tasks assessed  Lower Extremity Assessment: Generalized weakness (L LE grossly 3+/5; R LE 4+/5)         Communication   Communication: No difficulties  Cognition Arousal/Alertness: Awake/alert Behavior During Therapy: WFL for tasks assessed/performed Overall Cognitive Status: Within Functional Limits for tasks assessed                      General Comments      Exercises     Assessment/Plan    PT  Assessment Patient needs continued PT services  PT Problem List Decreased strength;Decreased range of motion;Decreased activity tolerance;Decreased balance;Decreased mobility;Decreased knowledge of use of DME;Pain          PT Treatment Interventions Gait training;DME instruction;Therapeutic exercise    PT Goals (Current goals can be found in the Care Plan section)  Acute Rehab PT Goals Patient Stated Goal: to get stronger PT Goal Formulation: With patient Time For Goal Achievement: 05/26/16 Potential to Achieve Goals: Good    Frequency Min 2X/week   Barriers to discharge        Co-evaluation               End of Session Equipment Utilized During Treatment: Gait belt;Oxygen Activity Tolerance: Patient limited by pain Patient left: in chair;with chair alarm set Nurse Communication: Mobility status         Time: [id number] PT Time Calculation (min) (ACUTE ONLY): 15 min   Charges:   PT Evaluation $PT Eval Moderate Complexity: 1 Procedure     PT G Codes:        Kerstie Agent Jun 07, 2016, 11:06 AM Greggory Stallion, PT, DPT [phone]

## 2016-05-12 NOTE — Progress Notes (Addendum)
[place] [facility] [place] NAME: Jane Short    MR#:  [id number]  DATE OF BIRTH:  [birth year]  SUBJECTIVE:seen at bedside.admitted for left hip.anemia.received 2 units transfusions.still has left  Hip pain ,worse with minimal ambulation. .  CHIEF COMPLAINT:  No chief complaint on file.   REVIEW OF SYSTEMS:   ROS CONSTITUTIONAL: No fever, fatigue or weakness.  EYES: No blurred or double vision.  EARS, NOSE, AND THROAT: No tinnitus or ear pain.  RESPIRATORY: No cough, shortness of breath, wheezing or hemoptysis.  CARDIOVASCULAR: No chest pain, orthopnea, edema.  GASTROINTESTINAL: No nausea, vomiting, diarrhea or abdominal pain.  GENITOURINARY: No dysuria, hematuria.  ENDOCRINE: No polyuria, nocturia,  HEMATOLOGY: No anemia, easy bruising or bleeding SKIN: No rash or lesion. MUSCULOSKELETAL: severe hip pain.  NEUROLOGIC: No tingling, numbness, weakness.  PSYCHIATRY: No anxiety or depression.   DRUG ALLERGIES:   Allergies  Allergen Reactions  . Ramipril     cough  . Septra [Sulfamethoxazole-Trimethoprim]     Joint pain    VITALS:  Blood pressure (!) 117/43, pulse 61, temperature 97.5 F (36.4 C), temperature source Oral, resp. rate 16, height 5\' 5"  (1.651 m), weight 88.5 kg (195 lb), SpO2 92 %.  PHYSICAL EXAMINATION:  GENERAL:  65 y.o.-year-old patient lying in the bed with no acute distress.  EYES: Pupils equal, round, reactive to light and accommodation. No scleral icterus. Extraocular muscles intact.  HEENT: Head atraumatic, normocephalic. Oropharynx and nasopharynx clear.  NECK:  Supple, no jugular venous distention. No thyroid enlargement, no tenderness.  LUNGS: Normal breath sounds bilaterally, no wheezing, rales,rhonchi or crepitation. No use of accessory muscles of respiration.  CARDIOVASCULAR: S1, S2 normal. No murmurs, rubs, or gallops.  ABDOMEN: Soft, nontender, nondistended. Bowel sounds present. No organomegaly  or mass.  EXTREMITIES: tenderness in left buttock.limited mobility.  NEUROLOGIC: Cranial nerves II through XII are intact. Muscle strength 5/5 in all extremities. Sensation intact. Gait not checked.   PSYCHIATRIC: The patient is alert and oriented x 3.  SKIN: No obvious rash, lesion, or ulcer.    LABORATORY PANEL:   CBC  Recent Labs Lab 05/12/16 0235  WBC 12.5*  HGB 7.6*  HCT 21.5*  PLT 214   ------------------------------------------------------------------------------------------------------------------  Chemistries   Recent Labs Lab 05/12/16 0235  NA 137  K 5.7*  CL 104  CO2 23  GLUCOSE 97  BUN 84*  CREATININE 4.25*  CALCIUM 9.0  AST 40  ALT 12*  ALKPHOS 142*  BILITOT 14.1*   ------------------------------------------------------------------------------------------------------------------  Cardiac Enzymes No results for input(s): TROPONINI in the last 168 hours. ------------------------------------------------------------------------------------------------------------------  RADIOLOGY:  Dg Hip Unilat With Pelvis 2-3 Views Left  Result Date: 05/11/2016 CLINICAL DATA:  Hip pain. EXAM: DG HIP (WITH OR WITHOUT PELVIS) 2-3V LEFT COMPARISON:  None. FINDINGS: There is no evidence of hip fracture or dislocation. There is no evidence of arthropathy or other focal bone abnormality. IMPRESSION: Normal left hip. Electronically Signed   By: Marijo Conception, M.D.   On: 05/11/2016 15:59    EKG:   Orders placed or performed during the hospital encounter of 02/27/16  . EKG    ASSESSMENT AND PLAN:  [age] yr [age] female with ckd stage 5 ,proteinuria,sickle cell disease,comes in from oncology office for acute on chonic anemia and left hip pain. 1.Acute on chronic anemia;s/p 2 units transfusion.hb improved from 5 to 7.6. 2.ckd stage 5;stable kidney function. 3.left hip pain;negative for fracture by xray.MRI of left hip to evaluate for  osteonecrosis.continue IV pain  meds,added muscle relaxants, 4.sickle cell disease;baseline hb is around 6. .5.congestive heptopathy.; 6.hyperkalemia;ckd stage 5.no indication for HD.Marland Kitchen     All the records are reviewed and case discussed with Care Management/Social Workerr. Management plans discussed with the patient, family and they are in agreement.  CODE STATUS:full  TOTAL TIME TAKING CARE OF THIS PATIENT:35 minutes.   POSSIBLE D/C IN 1-2DAYS, DEPENDING ON CLINICAL CONDITION.   Epifanio Lesches M.D on 05/12/2016 at 6:04 PM  Between 7am to 6pm - Pager - [phone]  After 6pm go to [email or web address] - password [email or web address] [facility]  Office  [phone]  CC: Primary care physician; Tommi Rumps, MD   Note: This dictation was prepared with Dragon dictation along with smaller phrase technology. Any transcriptional errors that result from this process are unintentional.

## 2016-05-12 NOTE — Care Management (Signed)
Admitted to [facility] with the diagnosis of hip pain. Lives with husband, Levora Dredge [phone]). Last seen Dr. Ronette Deter about a year ago. Will be seeing Dr. Biagio Quint soon. No home health. No skilled facility. Home oxygen per [facility] since July 2017. 2 liters per nasal cannula continuous. Takes care of all basic activities of daily living herself, drives. No falls Good appetite. Prescriptions are filled at [facility] in [place]. Husband will transport. Shelbie Ammons RN MSN CCM Care Management

## 2016-05-12 NOTE — Progress Notes (Signed)
Subjective:  Patient is a 65 year old African-American female known to [place] from outpatient. She is followed for chronic kidney disease stage V and proteinuria. She has medical problems of sickle cell disease and congestive hepatopathy. She is admitted this time for symptomatic anemia and left hip pain. Most recent labs show increase in creatinine of 4.25/GFR 10 Baseline creatinine from September 2017 was 3.82/GFR 13 Other lab abnormalities include high bilirubin of 14.1, hypercalcemia 5.7, hemoglobin 7.6   Objective:  Vital signs in last 24 hours:  Temp:  [97.5 F (36.4 C)-98.3 F (36.8 C)] 97.5 F (36.4 C) (12/12 1441) Pulse Rate:  [48-109] 61 (12/12 1441) Resp:  [16-22] 16 (12/12 0440) BP: (100-124)/(38-62) 117/43 (12/12 1441) SpO2:  [92 %-100 %] 92 % (12/12 1441) Weight:  [88.5 kg (195 lb)] 88.5 kg (195 lb) (12/12 0117)  Weight change:  Filed Weights   05/12/16 0117  Weight: 88.5 kg (195 lb)    Intake/Output:    Intake/Output Summary (Last 24 hours) at 05/12/16 1559 Last data filed at 05/12/16 1200  Gross per 24 hour  Intake              984 ml  Output             1200 ml  Net             -216 ml     Physical Exam: General: No acute distress, laying in the bed   HEENT +  Scleral icterus, moist oral mucous membranes   Neck Supple   Pulm/lungs Normal breathing effort, clear to auscultation   CVS/Heart No rub or gallop   Abdomen:  Soft, nontender, nondistended   Extremities: Trace to 1+ pitting edema   Neurologic: Alert oriented   Skin: No acute rashes   Access: Left upper arm AV fistula clotted        Basic Metabolic Panel:   Recent Labs Lab 05/11/16 1523 05/12/16 0235  NA 137 137  K 5.8* 5.7*  CL 105 104  CO2 21* 23  GLUCOSE 101* 97  BUN 80* 84*  CREATININE 4.06* 4.25*  CALCIUM 9.2 9.0     CBC:  Recent Labs Lab 05/11/16 1010 05/12/16 0235  WBC  --  12.5*  HGB 5.0* 7.6*  HCT 14.3* 21.5*  MCV  --  93.5  PLT  --  214       Microbiology:  No results found for this or any previous visit (from the past 720 hour(s)).  Coagulation Studies: No results for input(s): LABPROT, INR in the last 72 hours.  Urinalysis: No results for input(s): COLORURINE, LABSPEC, PHURINE, GLUCOSEU, HGBUR, BILIRUBINUR, KETONESUR, PROTEINUR, UROBILINOGEN, NITRITE, LEUKOCYTESUR in the last 72 hours.  Invalid input(s): APPERANCEUR    Imaging: Dg Hip Unilat With Pelvis 2-3 Views Left  Result Date: 05/11/2016 CLINICAL DATA:  Hip pain. EXAM: DG HIP (WITH OR WITHOUT PELVIS) 2-3V LEFT COMPARISON:  None. FINDINGS: There is no evidence of hip fracture or dislocation. There is no evidence of arthropathy or other focal bone abnormality. IMPRESSION: Normal left hip. Electronically Signed   By: Marijo Conception, M.D.   On: 05/11/2016 15:59     Medications:    . amiodarone  100 mg Oral Daily  . calcitRIOL  0.25 mcg Oral Daily  . fluticasone furoate-vilanterol  1 puff Inhalation Daily  . folic acid  1 mg Oral Daily  . furosemide  20 mg Intravenous Once  . hydroxyurea  500 mg Oral Daily  . sodium bicarbonate  650 mg Oral BID   acetaminophen **OR** acetaminophen, heparin lock flush, heparin lock flush, HYDROcodone-acetaminophen, ondansetron **OR** ondansetron (ZOFRAN) IV, senna-docusate, sodium chloride flush, sodium chloride flush  Assessment/ Plan:  65 y.o. African-American female with chronic kidney disease stage V, proteinuria, sickle cell disease, congestive liver, atrial myxoma  1. Chronic kidney disease stage V with proteinuria Creatinine is 4.25/GFR 12 BUN level is high at 84 Potassium is slightly high at 5.7 Patient denies uremic symptoms including change in taste, change in appetite,chronic nausea or vomiting She did have some nausea and vomiting yesterday evening due to pain Left upper arm AV access is clotted. She will need a graft prior to starting dialysis At present, there is no acute indication to start  hemodialysis. We will continue to monitor closely.  2. Hyperkalemia We'll manage with low potassium diet and veltassa     LOS: 1 Jane Short 12/12/20173:59 PM

## 2016-05-13 ENCOUNTER — Inpatient Hospital Stay (HOSPITAL_COMMUNITY)
Admission: AD | Admit: 2016-05-13 | Discharge: 2016-05-21 | DRG: 682 | Disposition: A | Discharge status: Home Health | Payer: Medicare Other | Source: Other Acute Inpatient Hospital | Attending: Internal Medicine | Admitting: Internal Medicine

## 2016-05-13 ENCOUNTER — Inpatient Hospital Stay: Payer: Medicare Other

## 2016-05-13 DIAGNOSIS — G4733 Obstructive sleep apnea (adult) (pediatric): Secondary | ICD-10-CM | POA: Diagnosis present

## 2016-05-13 DIAGNOSIS — I2721 Secondary pulmonary arterial hypertension: Secondary | ICD-10-CM | POA: Diagnosis present

## 2016-05-13 DIAGNOSIS — I700 Atherosclerosis of aorta: Secondary | ICD-10-CM | POA: Diagnosis present

## 2016-05-13 DIAGNOSIS — K5900 Constipation, unspecified: Secondary | ICD-10-CM | POA: Diagnosis present

## 2016-05-13 DIAGNOSIS — L439 Lichen planus, unspecified: Secondary | ICD-10-CM | POA: Diagnosis present

## 2016-05-13 DIAGNOSIS — J449 Chronic obstructive pulmonary disease, unspecified: Secondary | ICD-10-CM | POA: Diagnosis present

## 2016-05-13 DIAGNOSIS — G9340 Encephalopathy, unspecified: Secondary | ICD-10-CM | POA: Diagnosis not present

## 2016-05-13 DIAGNOSIS — M25462 Effusion, left knee: Secondary | ICD-10-CM | POA: Diagnosis present

## 2016-05-13 DIAGNOSIS — I348 Other nonrheumatic mitral valve disorders: Secondary | ICD-10-CM | POA: Diagnosis not present

## 2016-05-13 DIAGNOSIS — Z79899 Other long term (current) drug therapy: Secondary | ICD-10-CM

## 2016-05-13 DIAGNOSIS — R17 Unspecified jaundice: Secondary | ICD-10-CM | POA: Diagnosis not present

## 2016-05-13 DIAGNOSIS — N185 Chronic kidney disease, stage 5: Secondary | ICD-10-CM | POA: Diagnosis present

## 2016-05-13 DIAGNOSIS — D571 Sickle-cell disease without crisis: Secondary | ICD-10-CM | POA: Diagnosis present

## 2016-05-13 DIAGNOSIS — M10362 Gout due to renal impairment, left knee: Secondary | ICD-10-CM | POA: Diagnosis not present

## 2016-05-13 DIAGNOSIS — M545 Low back pain, unspecified: Secondary | ICD-10-CM

## 2016-05-13 DIAGNOSIS — K761 Chronic passive congestion of liver: Secondary | ICD-10-CM | POA: Diagnosis present

## 2016-05-13 DIAGNOSIS — M25552 Pain in left hip: Secondary | ICD-10-CM | POA: Diagnosis present

## 2016-05-13 DIAGNOSIS — N179 Acute kidney failure, unspecified: Secondary | ICD-10-CM | POA: Diagnosis present

## 2016-05-13 DIAGNOSIS — M109 Gout, unspecified: Secondary | ICD-10-CM | POA: Diagnosis present

## 2016-05-13 DIAGNOSIS — D589 Hereditary hemolytic anemia, unspecified: Secondary | ICD-10-CM | POA: Diagnosis present

## 2016-05-13 DIAGNOSIS — M810 Age-related osteoporosis without current pathological fracture: Secondary | ICD-10-CM | POA: Diagnosis present

## 2016-05-13 DIAGNOSIS — B964 Proteus (mirabilis) (morganii) as the cause of diseases classified elsewhere: Secondary | ICD-10-CM | POA: Diagnosis present

## 2016-05-13 DIAGNOSIS — D5700 Hb-SS disease with crisis, unspecified: Secondary | ICD-10-CM

## 2016-05-13 DIAGNOSIS — Z9842 Cataract extraction status, left eye: Secondary | ICD-10-CM | POA: Diagnosis not present

## 2016-05-13 DIAGNOSIS — M48061 Spinal stenosis, lumbar region without neurogenic claudication: Secondary | ICD-10-CM | POA: Diagnosis present

## 2016-05-13 DIAGNOSIS — D638 Anemia in other chronic diseases classified elsewhere: Secondary | ICD-10-CM | POA: Diagnosis not present

## 2016-05-13 DIAGNOSIS — J9601 Acute respiratory failure with hypoxia: Secondary | ICD-10-CM | POA: Diagnosis present

## 2016-05-13 DIAGNOSIS — I4891 Unspecified atrial fibrillation: Secondary | ICD-10-CM | POA: Diagnosis present

## 2016-05-13 DIAGNOSIS — R52 Pain, unspecified: Secondary | ICD-10-CM

## 2016-05-13 DIAGNOSIS — D151 Benign neoplasm of heart: Secondary | ICD-10-CM | POA: Diagnosis present

## 2016-05-13 DIAGNOSIS — R269 Unspecified abnormalities of gait and mobility: Secondary | ICD-10-CM | POA: Diagnosis not present

## 2016-05-13 DIAGNOSIS — N390 Urinary tract infection, site not specified: Secondary | ICD-10-CM | POA: Diagnosis present

## 2016-05-13 DIAGNOSIS — E875 Hyperkalemia: Secondary | ICD-10-CM | POA: Diagnosis present

## 2016-05-13 DIAGNOSIS — Z87891 Personal history of nicotine dependence: Secondary | ICD-10-CM

## 2016-05-13 DIAGNOSIS — I132 Hypertensive heart and chronic kidney disease with heart failure and with stage 5 chronic kidney disease, or end stage renal disease: Secondary | ICD-10-CM | POA: Diagnosis present

## 2016-05-13 DIAGNOSIS — D631 Anemia in chronic kidney disease: Secondary | ICD-10-CM | POA: Diagnosis present

## 2016-05-13 DIAGNOSIS — I509 Heart failure, unspecified: Secondary | ICD-10-CM | POA: Diagnosis present

## 2016-05-13 DIAGNOSIS — D596 Hemoglobinuria due to hemolysis from other external causes: Secondary | ICD-10-CM | POA: Diagnosis not present

## 2016-05-13 DIAGNOSIS — R531 Weakness: Secondary | ICD-10-CM

## 2016-05-13 DIAGNOSIS — Z9841 Cataract extraction status, right eye: Secondary | ICD-10-CM

## 2016-05-13 DIAGNOSIS — M47816 Spondylosis without myelopathy or radiculopathy, lumbar region: Secondary | ICD-10-CM | POA: Diagnosis present

## 2016-05-13 DIAGNOSIS — G4700 Insomnia, unspecified: Secondary | ICD-10-CM | POA: Diagnosis present

## 2016-05-13 DIAGNOSIS — L89892 Pressure ulcer of other site, stage 2: Secondary | ICD-10-CM | POA: Diagnosis present

## 2016-05-13 DIAGNOSIS — M25562 Pain in left knee: Secondary | ICD-10-CM | POA: Diagnosis not present

## 2016-05-13 DIAGNOSIS — I38 Endocarditis, valve unspecified: Secondary | ICD-10-CM

## 2016-05-13 LAB — BASIC METABOLIC PANEL
Anion gap: 7 (ref 5–15)
BUN: 70 mg/dL — AB (ref 6–20)
CO2: 17 mmol/L — AB (ref 22–32)
Calcium: 7 mg/dL — ABNORMAL LOW (ref 8.9–10.3)
Chloride: 116 mmol/L — ABNORMAL HIGH (ref 101–111)
Creatinine, Ser: 3.24 mg/dL — ABNORMAL HIGH (ref 0.44–1.00)
GFR calc Af Amer: 16 mL/min — ABNORMAL LOW (ref >60)
GFR calc non Af Amer: 14 mL/min — ABNORMAL LOW (ref >60)
GLUCOSE: 82 mg/dL (ref 65–99)
POTASSIUM: 4.6 mmol/L (ref 3.5–5.1)
Sodium: 140 mmol/L (ref 135–145)

## 2016-05-13 LAB — LACTATE DEHYDROGENASE: LDH: 646 U/L — ABNORMAL HIGH (ref 98–192)

## 2016-05-13 MED ORDER — SENNOSIDES-DOCUSATE SODIUM 8.6-50 MG PO TABS
1.0000 | ORAL_TABLET | Freq: Two times a day (BID) | ORAL | Status: DC
  Administered 2016-05-13 – 2016-05-21 (×16): 1 via ORAL
  Filled 2016-05-13 (×16): qty 1

## 2016-05-13 MED ORDER — MORPHINE SULFATE (PF) 4 MG/ML IV SOLN: 1.0000 mg | INTRAVENOUS | Status: DC | PRN

## 2016-05-13 MED ORDER — HYDROXYUREA 500 MG PO CAPS
500.0000 mg | ORAL_CAPSULE | Freq: Every day | ORAL | Status: DC
  Administered 2016-05-14 – 2016-05-18 (×5): 500 mg via ORAL
  Filled 2016-05-13 (×6): qty 1

## 2016-05-13 MED ORDER — MORPHINE SULFATE 15 MG PO TABS
15.0000 mg | ORAL_TABLET | ORAL | Status: DC | PRN
  Filled 2016-05-13: qty 1

## 2016-05-13 MED ORDER — HYDROMORPHONE HCL 2 MG/ML IJ SOLN
0.5000 mg | INTRAMUSCULAR | Status: DC | PRN
  Administered 2016-05-14: 0.5 mg via INTRAVENOUS
  Filled 2016-05-13: qty 1

## 2016-05-13 MED ORDER — POLYETHYLENE GLYCOL 3350 17 G PO PACK
17.0000 g | PACK | Freq: Every day | ORAL | Status: DC | PRN
  Administered 2016-05-18 – 2016-05-20 (×2): 17 g via ORAL
  Filled 2016-05-13 (×3): qty 1

## 2016-05-13 MED ORDER — SODIUM BICARBONATE 650 MG PO TABS
650.0000 mg | ORAL_TABLET | Freq: Two times a day (BID) | ORAL | Status: DC
  Administered 2016-05-13 – 2016-05-21 (×16): 650 mg via ORAL
  Filled 2016-05-13 (×15): qty 1

## 2016-05-13 MED ORDER — SODIUM CHLORIDE 0.45 % IV SOLN: INTRAVENOUS | Status: DC

## 2016-05-13 MED ORDER — FOLIC ACID 1 MG PO TABS
1.0000 mg | ORAL_TABLET | Freq: Every day | ORAL | Status: DC
  Administered 2016-05-14 – 2016-05-21 (×8): 1 mg via ORAL
  Filled 2016-05-13 (×8): qty 1

## 2016-05-13 MED ORDER — AMIODARONE HCL 100 MG PO TABS
100.0000 mg | ORAL_TABLET | Freq: Every day | ORAL | Status: DC
  Administered 2016-05-14 – 2016-05-21 (×8): 100 mg via ORAL
  Filled 2016-05-13 (×8): qty 1

## 2016-05-13 MED ORDER — FLUTICASONE FUROATE-VILANTEROL 200-25 MCG/INH IN AEPB
1.0000 | INHALATION_SPRAY | Freq: Every day | RESPIRATORY_TRACT | Status: DC
Start: 2016-05-14 — End: 2016-05-21
  Administered 2016-05-16 – 2016-05-21 (×6): 1 via RESPIRATORY_TRACT
  Filled 2016-05-13 (×3): qty 28

## 2016-05-13 MED ORDER — CALCITRIOL 0.25 MCG PO CAPS
0.2500 ug | ORAL_CAPSULE | Freq: Every day | ORAL | Status: DC
  Administered 2016-05-14 – 2016-05-21 (×8): 0.25 ug via ORAL
  Filled 2016-05-13 (×8): qty 1

## 2016-05-13 MED ORDER — ONDANSETRON HCL 4 MG/2ML IJ SOLN
4.0000 mg | INTRAMUSCULAR | Status: DC | PRN
  Administered 2016-05-18: 4 mg via INTRAVENOUS
  Filled 2016-05-13: qty 2

## 2016-05-13 MED ORDER — DEXTROSE-NACL 5-0.45 % IV SOLN
INTRAVENOUS | Status: DC
  Administered 2016-05-13: via INTRAVENOUS

## 2016-05-13 MED ORDER — PANTOPRAZOLE SODIUM 40 MG PO TBEC
40.0000 mg | DELAYED_RELEASE_TABLET | Freq: Every day | ORAL | Status: DC
  Administered 2016-05-14 – 2016-05-21 (×8): 40 mg via ORAL
  Filled 2016-05-13 (×8): qty 1

## 2016-05-13 MED ORDER — ONDANSETRON HCL 4 MG PO TABS: 4.0000 mg | ORAL_TABLET | ORAL | Status: DC | PRN

## 2016-05-13 NOTE — Progress Notes (Signed)
Pt is being transferred to [facility] long hospital. Report called to [facility] RN at [phone]. [facility] notified us of eta, pt was changed into a transfer pack, port remained accessed. All belongings packed and given to patient's husband.

## 2016-05-13 NOTE — NC FL2 (Signed)
[place] LEVEL OF CARE SCREENING TOOL     IDENTIFICATION  Patient Name: Jane Short Birthdate: 04/24/1951 Sex: female Admission Date (Current Location): 05/11/2016  [place] and [place] Number:  Engineering geologist and Address:  [facility], [address], [place], [place] [address]      Provider Number: [id number]  Attending Physician Name and Address:  Epifanio Lesches, MD  Relative Name and Phone Number:  [place], [place] [phone]  [phone]     Current Level of Care: Hospital Recommended Level of Care: [place] Prior Approval Number:    Date Approved/Denied:   PASRR Number: [id number]  Discharge Plan: SNF    Current Diagnoses: Patient Active Problem List   Diagnosis Date Noted  . Hip pain 05/11/2016  . Renal dialysis device, implant, or graft complication 0000000  . Right hip pain 01/17/2016  . Iron deficiency anemia 12/06/2015  . Anemia of chronic renal failure, stage 4 (severe) ([place]) 12/06/2015  . Sickle cell anemia ([place]) 12/06/2015  . Chronic progressive renal failure 08/28/2015  . Bacteremia due to Enterococcus 07/30/2015  . Atrial fibrillation ([place]) 07/30/2015  . Insomnia 07/30/2015  . H. pylori infection 07/30/2015  . Bilirubinemia 06/27/2015  . CHF (congestive heart failure) ([place]) 06/05/2015  . Elevated bilirubin   . Hernia, ventral 04/17/2015  . Lichen planus [id number]  . Chronic kidney disease (CKD), stage III (moderate) 01/17/2015  . Atherosclerosis of aorta ([place]) 01/10/2015  . Abnormal CXR 01/03/2015  . Bilateral low back pain without sciatica 01/03/2015  . Hb-SS disease without crisis ([place]) 10/17/2014  . Left hip pain 12/12/2013  . Screening for colon cancer 08/04/2012  . Screening for breast cancer 08/04/2012  . Edema 11/19/2011  . Atrial myxoma 07/23/2011    Orientation RESPIRATION BLADDER Height & Weight     Self, Time, Situation, Place  O2, Other  (Comment) (3L and patient uses a CPap) Continent Weight: 195 lb (88.5 kg) Height:  5\' 5"  (165.1 cm)  BEHAVIORAL SYMPTOMS/MOOD NEUROLOGICAL BOWEL NUTRITION STATUS      Continent    AMBULATORY STATUS COMMUNICATION OF NEEDS Skin   Limited Assist Verbally Normal                       Personal Care Assistance Level of Assistance  Bathing, Feeding, Dressing Bathing Assistance: Limited assistance Feeding assistance: Independent Dressing Assistance: Limited assistance     Functional Limitations Info  Sight, Hearing, Speech Sight Info: Adequate Hearing Info: Adequate Speech Info: Adequate    SPECIAL CARE FACTORS FREQUENCY  PT (By licensed PT)     PT Frequency: 5x a week              Contractures Contractures Info: Not present    Additional Factors Info  Code Status, Allergies Code Status Info: Full Code Allergies Info: RAMIPRIL, SEPTRA SULFAMETHOXAZOLE-TRIMETHOPRIM            Current Medications (05/13/2016):  This is the current hospital active medication list Current Facility-Administered Medications  Medication Dose Route Frequency Provider Last Rate Last Dose  . 0.45 % sodium chloride infusion   Intravenous Continuous Epifanio Lesches, MD      . acetaminophen (TYLENOL) tablet 650 mg  650 mg Oral Q6H PRN Bettey Costa, MD       Or  . acetaminophen (TYLENOL) suppository 650 mg  650 mg Rectal Q6H PRN Bettey Costa, MD      . amiodarone (PACERONE) tablet 100 mg  100 mg Oral Daily  Bettey Costa, MD   100 mg at 05/12/16 1011  . calcitRIOL (ROCALTROL) capsule 0.25 mcg  0.25 mcg Oral Daily Sital Mody, MD   0.25 mcg at 05/13/16 0900  . fluticasone furoate-vilanterol (BREO ELLIPTA) 200-25 MCG/INH 1 puff  1 puff Inhalation Daily Bettey Costa, MD   1 puff at 05/13/16 0901  . folic acid (FOLVITE) tablet 1 mg  1 mg Oral Daily Sital Mody, MD   1 mg at 05/13/16 0904  . heparin lock flush 100 unit/mL  500 Units Intracatheter Daily PRN Cammie Sickle, MD      . heparin lock flush  100 unit/mL  250 Units Intracatheter PRN Cammie Sickle, MD      . hydroxyurea (HYDREA) capsule 500 mg  500 mg Oral Daily Sital Mody, MD   500 mg at 05/13/16 0900  . morphine (MSIR) tablet 15 mg  15 mg Oral Q4H PRN Epifanio Lesches, MD      . morphine 4 MG/ML injection 1 mg  1 mg Intravenous Q2H PRN Epifanio Lesches, MD      . ondansetron (ZOFRAN) tablet 4 mg  4 mg Oral Q6H PRN Bettey Costa, MD       Or  . ondansetron (ZOFRAN) injection 4 mg  4 mg Intravenous Q6H PRN Bettey Costa, MD      . patiromer Daryll Drown) packet 8.4 g  8.4 g Oral Daily Murlean Iba, MD   8.4 g at 05/12/16 1828  . senna-docusate (Senokot-S) tablet 1 tablet  1 tablet Oral QHS PRN Bettey Costa, MD      . sodium bicarbonate tablet 650 mg  650 mg Oral BID Bettey Costa, MD   650 mg at 05/13/16 0900  . sodium chloride flush (NS) 0.9 % injection 10 mL  10 mL Intracatheter PRN Cammie Sickle, MD      . sodium chloride flush (NS) 0.9 % injection 3 mL  3 mL Intracatheter PRN Cammie Sickle, MD       Facility-Administered Medications Ordered in Other Encounters  Medication Dose Route Frequency Provider Last Rate Last Dose  . 0.9 %  sodium chloride infusion  250 mL Intravenous Once Cammie Sickle, MD      . acetaminophen (TYLENOL) tablet 650 mg  650 mg Oral Once Cammie Sickle, MD      . acetaminophen (TYLENOL) tablet 650 mg  650 mg Oral Once Cammie Sickle, MD      . diphenhydrAMINE (BENADRYL) capsule 25 mg  25 mg Oral Once Cammie Sickle, MD      . diphenhydrAMINE (BENADRYL) capsule 25 mg  25 mg Oral Once Cammie Sickle, MD      . furosemide (LASIX) injection 20 mg  20 mg Intravenous Once Cammie Sickle, MD      . furosemide (LASIX) injection 20 mg  20 mg Intravenous Once Cammie Sickle, MD      . heparin lock flush 100 unit/mL  250 Units Intracatheter PRN Leia Alf, MD      . heparin lock flush 100 unit/mL  500 Units Intracatheter Daily PRN Cammie Sickle, MD       . heparin lock flush 100 unit/mL  250 Units Intracatheter PRN Cammie Sickle, MD      . heparin lock flush 100 unit/mL  500 Units Intracatheter Daily PRN Cammie Sickle, MD      . heparin lock flush 100 unit/mL  250 Units Intracatheter PRN Cammie Sickle, MD      .  sodium chloride flush (NS) 0.9 % injection 10 mL  10 mL Intracatheter PRN Cammie Sickle, MD      . sodium chloride flush (NS) 0.9 % injection 10 mL  10 mL Intracatheter PRN Cammie Sickle, MD      . sodium chloride flush (NS) 0.9 % injection 3 mL  3 mL Intracatheter PRN Cammie Sickle, MD      . sodium chloride flush (NS) 0.9 % injection 3 mL  3 mL Intracatheter PRN Cammie Sickle, MD         Discharge Medications: Please see discharge summary for a list of discharge medications.  Relevant Imaging Results:  Relevant Lab Results:   Additional Information SSN [id number]  Ross Ludwig

## 2016-05-13 NOTE — H&P (Addendum)
History and Physical    Jane Short [id number] DOB: 1950-08-18 DOA: 05/13/2016  PCP: Tommi Rumps, MD  HEME: Dr. Rogue Bussing  Patient coming from: Transfer from [facility]  HPI: Jane Short is a 65 y.o. woman with a history of Sickle Cell (Hgb SS disease), COPD, HTN, CHF, CKD 5 (baseline creatinine 3.8) and congestive hepatopathy with chronically elevated bilirubin who was admitted to [facility] on 12/11 for symptomatic anemia and left hip pain.  She also complained of shortness of breath.  She was transfused 2 units of PRBCs, and seemed to have an appropriate elevation in her hemoglobin to 7.6 (from 5).  Upon admission, she also had elevated reticulocyte count to 17% and LDH of 901 (improved to 646 on repeat).  There was concern for ongoing sickle cell crisis (vs DIC) despite these initial interventions, and the patient was accepted for transfer to this campus by Dr. Zigmund Daniel.  Hospitalist asked to admit.  Regarding her hip pain, left hip xray is normal.  Per her husband, MRI had not yet happened because the patient has been too uncomfortable to perform the test.  She is on 2L [place] with O2 sats in the mid 90's.  She is not on home oxygen, but she is supposed to use CPAP qHS for OSA.  She has not been compliant with this therapy in the hospital.  Chest xray today shows pulmonary vascular congestion but no other acute abnormalities.  BNP still pending.  Last echo in January 2017 showed normal LV EF but valvular heart disease with severe TR and moderate MR.  Nephrology was consulted for AKI on CKD 5.  She also had mild hyperkalemia.  Hyperkalemia treated with [place], but urgent HD was not performed.  At the time of my initial evaluation at [facility], the patient is and lethargic and much of her history has to be reviewed with her husband.  He confirms that she had apparently been back to her baseline state of health after a complicated admission to [facility] earlier this year.  Current  troubles began last Friday, when mild intermittent left hip pain became persistent, affecting her ability to ambulate.  She was seen in the ED in [place], [place], on Saturday, and received pain injections (presumably narcotics) in the ED and was discharged with a prescription for oral morphine.  She used a few of those pills but also admits that she has been using NSAIDs (Aleve, naprosyn; quantity unknown), which she knows that she is not supposed to do.  She denies chest pain or abdominal pain.  No significant nausea or vomiting.    By the end of the interview, she is more awake and can give accurate accounts of parts of her medical history, but she is still disoriented.  She only localizes pain to her left hip.    Review of Systems: As per HPI otherwise 10 point review of systems negative.    Past Medical History:  Diagnosis Date  . Anemia   . 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 Cheree [facility] at [facility]  . CHF (congestive heart failure) ([place])   . Congestive heart failure (CHF) ([place])   . Congestive heart failure (CHF) ([place]) 06/2015  . COPD (chronic obstructive pulmonary disease) (HCC)    symptoms Dr. Patricia Pesa   . Gout   . Hypertension   . Lichen planus   . Osteoporosis 2011   osteopenia  . SCA-1 (spinocerebellar ataxia type 1) ([place])   . Sickle cell anemia (HCC)  sickle cell disease  . Sickle cell anemia (HCC)   . Thrombocytopenia ([place])   . Vitamin D deficiency     Past Surgical History:  Procedure Laterality Date  . AV FISTULA PLACEMENT Left 02/27/2016   Procedure: INSERTION OF ARTERIOVENOUS (AV) GORE-TEX GRAFT ARM ( FOREARM LOOP );  Surgeon: Algernon Huxley, MD;  Location: [facility] ORS;  Service: Vascular;  Laterality: Left;  . CHOLECYSTECTOMY    . COLONOSCOPY  2014   [facility]  . EYE SURGERY     cataract bilateral  . GALLBLADDER SURGERY    . PERIPHERAL VASCULAR CATHETERIZATION Left 06/14/2015   Procedure: Dialysis/Perma Catheter Insertion;  Surgeon: Katha Cabal, MD;  Location: [place] CV LAB;  Service: Cardiovascular;  Laterality: Left;  . PERIPHERAL VASCULAR CATHETERIZATION N/A 01/23/2016   Procedure: Glori Luis Cath Insertion;  Surgeon: Algernon Huxley, MD;  Location: [place] CV LAB;  Service: Cardiovascular;  Laterality: N/A;  . PERIPHERAL VASCULAR CATHETERIZATION Left 03/30/2016   Procedure: A/V Shuntogram/Fistulagram;  Surgeon: Algernon Huxley, MD;  Location: [place] CV LAB;  Service: Cardiovascular;  Laterality: Left;  . TUBAL LIGATION       reports that she quit smoking about 12 months ago. Her smoking use included Cigarettes. She has a 7.50 pack-year smoking history. She has never used smokeless tobacco. She reports that she does not drink alcohol or use drugs.  She is married.  She has one adult son.  Allergies  Allergen Reactions  . Ramipril     cough  . Septra [Sulfamethoxazole-Trimethoprim]     Joint pain    Family History  Problem Relation Age of Onset  . Heart disease Father   . Diabetes Father   . Diabetes Sister   . Cancer Mother     breast cancer  . Cancer Maternal Aunt     Breast cancer     Prior to Admission medications   Medication Sig Start Date End Date Taking? Authorizing Provider  amiodarone (PACERONE) 100 MG tablet Take 100 mg by mouth daily.   Yes Historical Provider, MD  calcitRIOL (ROCALTROL) 0.25 MCG capsule Take 0.25 mcg by mouth daily.   Yes Historical Provider, MD  epoetin alfa (EPOGEN,PROCRIT) 57846 UNIT/ML injection Inject into the skin 3 (three) times a week.   Yes Historical Provider, MD  fluticasone furoate-vilanterol (BREO ELLIPTA) 200-25 MCG/INH AEPB Inhale 1 puff into the lungs daily. 02/27/16  Yes Flora Lipps, MD  folic acid (FOLVITE) 1 MG tablet Take 1 mg by mouth daily.   Yes Historical Provider, MD  furosemide (LASIX) 80 MG tablet Take 80 mg by mouth 2 (two) times daily. 03/16/16  Yes Historical Provider, MD  hydroxyurea (HYDREA) 500 MG capsule Take 1 capsule (500 mg total) by mouth daily. May take with food  to minimize GI side effects. 10/10/15  Yes Cammie Sickle, MD  OXYGEN Inhale into the lungs.   Yes Historical Provider, MD  sodium bicarbonate 650 MG tablet Take 650 mg by mouth 2 (two) times daily.    Yes Historical Provider, MD    Physical Exam: Vitals:   05/13/16 1744  BP: (!) 135/52  Pulse: 69  Resp: 18  Temp: 99.9 F (37.7 C)  SpO2: 97%      Constitutional: NAD, sleeping but arousable to voice, no acutely decompensating but certainly encephalopathic Vitals:   05/13/16 1744  BP: (!) 135/52  Pulse: 69  Resp: 18  Temp: 99.9 F (37.7 C)  SpO2: 97%   Eyes: PERRL, sclera are  YELLOW ENMT: Mucous membranes are dry. Posterior pharynx clear of any exudate or lesions. Normal dentition.  Neck: normal appearance, supple, no masses Respiratory: clear to auscultation listening anteriorly.  No wheezing.  Normal respiratory effort. No accessory muscle use.  Cardiovascular: Normal rate, regular rhythm, + murmur heard throughout precordium.  No extremity edema. 2+ pedal pulses. GI: abdomen is soft and compressible, mildly protuberant, at baseline per her husband.  No tenderness.  No guarding.  Bowel sounds are present. Musculoskeletal:  No joint deformity in upper and lower extremities. ROM reduced in left leg due to pain.  No contractures. Normal muscle tone.  Skin: no rashes, warm and dry.  No obvious hematoma at left hip. Neurologic: Generalized weakness but no focal deficits. Psychiatric: Only oriented to self; insight and judgment impaired.    Labs on Admission: I have personally reviewed following labs and imaging studies  CBC:  Recent Labs Lab 05/11/16 1010 05/12/16 0235  WBC  --  12.5*  HGB 5.0* 7.6*  HCT 14.3* 21.5*  MCV  --  93.5  PLT  --  [id number]   Basic Metabolic Panel:  Recent Labs Lab 05/11/16 1523 05/12/16 0235 05/13/16 0118  NA 137 137 140  K 5.8* 5.7* 4.6  CL 105 104 116*  CO2 21* 23 17*  GLUCOSE 101* 97 82  BUN 80* 84* 70*  CREATININE 4.06*  4.25* 3.24*  CALCIUM 9.2 9.0 7.0*   GFR: Estimated Creatinine Clearance: 19 mL/min (by C-G formula based on SCr of 3.24 mg/dL (H)). Liver Function Tests:  Recent Labs Lab 05/11/16 1523 05/12/16 0235  AST 47* 40  ALT 15 12*  ALKPHOS 150* 142*  BILITOT 13.0* 14.1*  PROT 7.2 7.0  ALBUMIN 3.4* 3.4*   Anemia Panel:  Recent Labs  05/11/16 1523  RETICCTPCT 17.3*    Radiological Exams on Admission: Dg Chest 1 View  Result Date: 05/13/2016 CLINICAL DATA:  Hypoxia, history sickle cell anemia, COPD EXAM: CHEST 1 VIEW COMPARISON:  Portable exam 1550 hours compared 06/22/2015 FINDINGS: LEFT jugular Port-A-Cath with tip projecting over SVC. Enlargement of cardiac silhouette with pulmonary vascular congestion. Aortic atherosclerosis. Lungs grossly clear. No definite infiltrate, pleural effusion or pneumothorax. No acute osseous findings. IMPRESSION: Enlargement of cardiac silhouette with pulmonary vascular congestion. No acute abnormalities. Electronically Signed   By: Lavonia Dana M.D.   On: 05/13/2016 16:05    Assessment/Plan Principal Problem:   Acute encephalopathy Active Problems:   Left hip pain   Elevated bilirubin   Sickle cell crisis (HCC)   AKI (acute kidney injury) ([place])   CKD (chronic kidney disease) stage 5, GFR less than 15 ml/min (HCC)   Valvular heart disease   Acute respiratory failure with hypoxia (HCC)      Acute encephalopathy, likely multifactorial at this point including acute illness, narcotic use, renal failure. --Caution with narcotic use going forward but I am reluctant to give narcan at this time due to her history of pain --Check ABG --U/A still pending  Sickle cell crisis with hemolysis --D5 1/2NS at 50cc/hr for now (as discussed with Dr. Zigmund Daniel).  Caution with maintenance fluids due to CHF, renal insufficiency. --Continue hydrea, folic acid --Repeat CBC, CMP, DIC labs now --Hold on repeat transfusion for now but she needs updated type and  screen --No evidence of active hemorrhage at this time  Left hip pain --MRI when stable (patient stating she will not go until her pain is under better control) --Caution with narcotic analgesics due to altered mental status and  mild hypoxia  OSA --Continue CPAP qHS  Elevated bilirubin, chronic --Monitor for now.  Per husband, follow-up with GI was not recommended at [facility]  History of atrial fibrillation noted in medical record; husband does not seem familiar with this diagnosis.  CHADS-VASc 3.  Presumably not anticoagulated due to chronic anemia, but amiodarone noted on her home medication list.  AKI on CKD 3 --She will need nephrology consult in the AM  Mild hypoxia, acute respiratory failure (new O2 requirement).  Likely multifactorial (OSA, narcotics). --Monitor volume status, caution with fluids --No pneumonia on chest xray --Continue home inhalers --Check ABG, BNP  DVT prophylaxis: SCDs Code Status: FULL Family Communication: Patient's husband at bedside at time of admission. Disposition Plan: To be determined. Consults called: NONE Admission status: Inpatient, transfer from [place]: 75 minutes   Eber Jones MD [facility] Pager [phone]  If 7PM-7AM, please contact night-coverage [email or web address] Password [facility]  05/13/2016, 8:26 PM

## 2016-05-13 NOTE — Discharge Summary (Addendum)
Jane Short, is a 65 y.o. female  DOB [birth year]  MRN [id number].  Admission date:  05/11/2016  Admitting Physician  Bettey Costa, MD  Discharge Date:  05/13/2016   Primary MD  Tommi Rumps, MD  Recommendations for primary care physician for things to follow:    Admission Diagnosis  anemia hemoglobin 5 severe chronic left hip pain    Discharge Diagnosis  anemia hemoglobin 5 severe chronic left hip pain     Active Problems:   Hip pain      Past Medical History:  Diagnosis Date  . Anemia   . 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 Cheree Ditto at [facility]  . CHF (congestive heart failure) ([place])   . Congestive heart failure (CHF) ([place])   . Congestive heart failure (CHF) ([place]) 06/2015  . COPD (chronic obstructive pulmonary disease) (HCC)    symptoms Dr. Patricia Pesa   . Gout   . Hypertension   . Lichen planus   . Osteoporosis 2011   osteopenia  . SCA-1 (spinocerebellar ataxia type 1) ([place])   . Sickle cell anemia (HCC)    sickle cell disease  . Sickle cell anemia (HCC)   . Thrombocytopenia ([place])   . Vitamin D deficiency     Past Surgical History:  Procedure Laterality Date  . AV FISTULA PLACEMENT Left 02/27/2016   Procedure: INSERTION OF ARTERIOVENOUS (AV) GORE-TEX GRAFT ARM ( FOREARM LOOP );  Surgeon: Algernon Huxley, MD;  Location: [facility] ORS;  Service: Vascular;  Laterality: Left;  . CHOLECYSTECTOMY    . COLONOSCOPY  2014   [facility]  . EYE SURGERY     cataract bilateral  . GALLBLADDER SURGERY    . PERIPHERAL VASCULAR CATHETERIZATION Left 06/14/2015   Procedure: Dialysis/Perma Catheter Insertion;  Surgeon: Katha Cabal, MD;  Location: [place] CV LAB;  Service: Cardiovascular;  Laterality: Left;  . PERIPHERAL VASCULAR CATHETERIZATION N/A 01/23/2016   Procedure: Glori Luis Cath Insertion;  Surgeon: Algernon Huxley,  MD;  Location: [place] CV LAB;  Service: Cardiovascular;  Laterality: N/A;  . PERIPHERAL VASCULAR CATHETERIZATION Left 03/30/2016   Procedure: A/V Shuntogram/Fistulagram;  Surgeon: Algernon Huxley, MD;  Location: [place] CV LAB;  Service: Cardiovascular;  Laterality: Left;  . TUBAL LIGATION         History of present illness and  Hospital Course:     Kindly see H&P for history of present illness and admission details, please review complete Labs, Consult reports and Test reports for all details in brief  HPI  from the history and physical done on the day of admission 65 year old female patient with history of [place] stage IV I, proteinuria, sickle cell disease followed up with hematology, hematologist at [facility], history of atrial myxoma, followed by cardiology Dr. Clayborn Bigness comes in because of left hip pain, anemia. Patient sent from hematology office DR .Brahmanday.    Hospital Course  65 year old female patient came in because of left hip pain: Left hip x-ray is negative for any fracture. Patient admitted for pain control, MRA of the left hip ordered. But the because of continued pain, sickle cell disease, elevated LDH, elevated reticulocyte count sickle cell crisis is suspected, spoke with Dr. Zigmund Daniel from a [facility], kindly accepted the patient [name]. Patient hemoglobin 5 on admission, transfuse 2 units of packed RBC, hemoglobin improved from 5-7.6, patient reticulocyte count 17%, manual count 257, LDH more than 900.  #2 CK D stage V, proteinuria: Baseline creatinine is  around 3.8. Patient has a BUN of 84, creatinine of 4.25 yesterday, creatinine today's as 3.24 but BUN 70. Baseline BUN is around 50. Patient to still makes urine and not on dialysis yet. Seen by nephrology. 900 mL of urine  Out pt since this am. Stop the Lasix. Gentle hydration with one fourth normal saline at 50 mL while  transfer.  #3 sickle cell disease: Patient takes Hydrea, folic acid. #4  sickle cell crisis: With right hip pain. Gentle hydration, pain medicines patient is unremarkable 1 tablet every 3-4 hours as needed, patient takes morphine sulfate immediate release at home but the dose is unknown I will confirm that.  #5 patient has a chef atrial fibrillation but not on admission because of anemia but rate is controlled on amiodarone. Follows up with Dr. Clayborn Bigness. #6 hyperbilirubinemia: Patient total bilirubin is 14.1, on admission 13. #7 hypoxia, room air saturations 87%, now on 3 L, saturation 97% I ordered chest x-ray / Discharge Condition: stable   Follow UP      Discharge Instructions  and  Discharge Medications        Medication List    STOP taking these medications   furosemide 80 MG tablet Commonly known as:  LASIX   iron dextran complex in sodium chloride 0.9 % 500 mL   naproxen sodium 220 MG tablet Commonly known as:  ANAPROX     TAKE these medications   amiodarone 100 MG tablet Commonly known as:  PACERONE Take 100 mg by mouth daily.   calcitRIOL 0.25 MCG capsule Commonly known as:  ROCALTROL Take 0.25 mcg by mouth daily.   epoetin alfa 40000 UNIT/ML injection Commonly known as:  EPOGEN,PROCRIT Inject into the skin 3 (three) times a week.   fluticasone furoate-vilanterol 200-25 MCG/INH Aepb Commonly known as:  BREO ELLIPTA Inhale 1 puff into the lungs daily.   folic acid 1 MG tablet Commonly known as:  FOLVITE Take 1 mg by mouth daily.   hydroxyurea 500 MG capsule Commonly known as:  HYDREA Take 1 capsule (500 mg total) by mouth daily. May take with food to minimize GI side effects.   OXYGEN Inhale into the lungs.   sodium bicarbonate 650 MG tablet Take 650 mg by mouth 2 (two) times daily.         Diet and Activity recommendation: See Discharge Instructions above   Consults obtained -nephro   Major procedures and Radiology Reports - PLEASE review detailed and final reports for all details, in brief -      Dg  Hip Unilat With Pelvis 2-3 Views Left  Result Date: 05/11/2016 CLINICAL DATA:  Hip pain. EXAM: DG HIP (WITH OR WITHOUT PELVIS) 2-3V LEFT COMPARISON:  None. FINDINGS: There is no evidence of hip fracture or dislocation. There is no evidence of arthropathy or other focal bone abnormality. IMPRESSION: Normal left hip. Electronically Signed   By: Marijo Conception, M.D.   On: 05/11/2016 15:59    Micro Results     No results found for this or any previous visit (from the past 240 hour(s)).     Today   Subjective:   Thamas Jaegers today stable  for transfer to [facility].  Objective:   Blood pressure (!) 119/36, pulse 64, temperature 97.9 F (36.6 C), temperature source Oral, resp. rate 18, height 5\' 5"  (1.651 m), weight 88.5 kg (195 lb), SpO2 97 %.   Intake/Output Summary (Last 24 hours) at 05/13/16 1447 Last data filed at 05/13/16 1404  Gross  per 24 hour  Intake                0 ml  Output              900 ml  Net             -900 ml    Exam Awake Alert, Oriented x 3, No new F.N deficits, Normal affect [place].AT,PERRAL Supple Neck,No JVD, No cervical lymphadenopathy appriciated.  Symmetrical Chest wall movement, Good air movement bilaterally, CTAB RRR,No Gallops,Rubs or new Murmurs, No Parasternal Heave +ve B.Sounds, Abd Soft, Non tender, No organomegaly appriciated, No rebound -guarding or rigidity. No Cyanosis, Clubbing or edema, No new Rash or bruise  Data Review   CBC w Diff:  Lab Results  Component Value Date   WBC 12.5 (H) 05/12/2016   HGB 7.6 (L) 05/12/2016   HGB 5.8 (L) 09/26/2014   HCT 21.5 (L) 05/12/2016   HCT 19.8 (L) 06/27/2014   PLT 214 05/12/2016   PLT 223 06/27/2014   LYMPHOPCT 23 04/20/2016   LYMPHOPCT 31.8 12/20/2013   BANDSPCT 0 04/20/2016   MONOPCT 16 04/20/2016   MONOPCT 12 06/27/2014   MONOPCT 13.6 12/20/2013   EOSPCT 5 04/20/2016   EOSPCT 3.6 12/20/2013   BASOPCT 2 04/20/2016   BASOPCT 1.7 12/20/2013    CMP:  Lab Results   Component Value Date   NA 140 05/13/2016   NA 138 06/20/2012   K 4.6 05/13/2016   K 4.3 06/20/2012   CL 116 (H) 05/13/2016   CL 105 06/20/2012   CO2 17 (L) 05/13/2016   CO2 24 06/20/2012   BUN 70 (H) 05/13/2016   BUN 13 06/20/2012   CREATININE 3.24 (H) 05/13/2016   CREATININE 1.36 (H) 03/22/2013   GLU 89 02/20/2010   PROT 7.0 05/12/2016   PROT 7.1 03/22/2013   ALBUMIN 3.4 (L) 05/12/2016   ALBUMIN 3.7 03/22/2013   BILITOT 14.1 (H) 05/12/2016   BILITOT 4.0 (H) 03/22/2013   ALKPHOS 142 (H) 05/12/2016   ALKPHOS 139 (H) 03/22/2013   AST 40 05/12/2016   AST 48 (H) 03/22/2013   ALT 12 (L) 05/12/2016   ALT 25 03/22/2013  .   Total Time in preparing paper work, data evaluation and todays exam - 34 minutes  Montia Haslip M.D on 05/13/2016 at 2:47 PM    Note: This dictation was prepared with Dragon dictation along with smaller phrase technology. Any transcriptional errors that result from this process are unintentional.

## 2016-05-13 NOTE — Progress Notes (Signed)
While rounding, [facility] made initial visit to room 114. Pt was sitting in bedside chair but in and out of consciousness due to medication. Husband was bedside. Husband stated they were awaiting an MRI in hopes of discovering where her hip pain was originating. Husband was concerned that the cause of her pain had no name. Husband asked for prayer. [place] prayed for an ability to "name" the pain so that treatment would be more effective. Husband appreciated the prayer. CH is available for follow up as needed.    05/13/16 1500  Clinical Encounter Type  Visited With Patient;Patient and family together  Visit Type Initial;Spiritual support  Referral From Nurse  Spiritual Encounters  Spiritual Needs Prayer;Emotional

## 2016-05-13 NOTE — Clinical Social Work Note (Signed)
MSW was informed that patient will be potentially transferring to a different hospital.    Jones Broom. Norval Morton, MSW [phone]  Mon-Fri 8a-4:30p 05/13/2016 3:27 PM

## 2016-05-13 NOTE — Clinical Social Work Note (Signed)
Clinical Social Work Assessment  Patient Details  Name: RUFINA HIGNIGHT MRN: [id number] Date of Birth: Nov 23, 1950  Date of referral:  05/13/16               Reason for consult:  Facility Placement                Permission sought to share information with:  Facility Sport and exercise psychologist, Family Supports Permission granted to share information::  Yes, Verbal Permission Granted  Name::     [place], [place] [phone]  [phone]   Agency::  SNF admissions  Relationship::     Contact Information:     Housing/Transportation Living arrangements for the past 2 months:  Single Family Home Source of Information:  Patient Patient Interpreter Needed:  None Criminal Activity/Legal Involvement Pertinent to Current Situation/Hospitalization:  No - Comment as needed Significant Relationships:  Spouse Lives with:  Spouse Do you feel safe going back to the place where you live?  Yes Need for family participation in patient care:  Yes (Comment) (Patient requests to have MSW speak to her husband regarding SNF placement options)  Care giving concerns:  Patient would like to go home but is aware she may need SNF for short term rehab.   Social Worker assessment / plan:  Patient is a 65 year old female who is married and alert and oriented x4.  Patient is married and lives with her husband, patient states she has not been to rehab in the past.  MSW explained to patient the role of MSW, and what to expect at [facility] for rehab.  MSW explained to patient how insurance will pay for a stay at SNF and what the process is for planning discharging to SNF.  Patient stated she does not want to go to SNF, but will consider if she needs to and her husband agrees to it.  Patient did not express any other questions about going to SNF or short term rehab.  Patient asked MSW to speak to her husband about going to SNF, MSW contacted patient's husband, and he said he would think about going to SNF after the physician  figures out what is causing patient's pain in her hip. Employment status:  Retired Forensic scientist:  Medicare PT Recommendations:  [employer] / Referral to community resources:  [place]  Patient/Family's Response to care:  Patient agreeable to consider SNF, but has not completely said yes she will agree to go to SNF.  Patient/Family's Understanding of and Emotional Response to Diagnosis, Current Treatment, and Prognosis: Patient did not express any concerns about her diagnosis or treatment plan.  Emotional Assessment Appearance:  Appears stated age Attitude/Demeanor/Rapport:    Affect (typically observed):  Pleasant, Appropriate, Calm Orientation:  Oriented to Self, Oriented to Place, Oriented to  Time, Oriented to Situation Alcohol / Substance use:  Not Applicable Psych involvement (Current and /or in the community):  No (Comment)  Discharge Needs  Concerns to be addressed:  Lack of Support Readmission within the last 30 days:  No Current discharge risk:  Lack of support system Barriers to Discharge:  Continued Medical Work up   Ross Ludwig 05/13/2016, 3:22 PM

## 2016-05-13 NOTE — Progress Notes (Signed)
Pt offered pain medication before transport but she refused.

## 2016-05-14 ENCOUNTER — Inpatient Hospital Stay (HOSPITAL_COMMUNITY): Payer: Medicare Other

## 2016-05-14 ENCOUNTER — Encounter (HOSPITAL_COMMUNITY): Payer: Self-pay

## 2016-05-14 DIAGNOSIS — N184 Chronic kidney disease, stage 4 (severe): Secondary | ICD-10-CM

## 2016-05-14 DIAGNOSIS — D596 Hemoglobinuria due to hemolysis from other external causes: Secondary | ICD-10-CM

## 2016-05-14 DIAGNOSIS — I348 Other nonrheumatic mitral valve disorders: Secondary | ICD-10-CM

## 2016-05-14 DIAGNOSIS — J9601 Acute respiratory failure with hypoxia: Secondary | ICD-10-CM

## 2016-05-14 DIAGNOSIS — M545 Low back pain: Secondary | ICD-10-CM

## 2016-05-14 DIAGNOSIS — E875 Hyperkalemia: Secondary | ICD-10-CM

## 2016-05-14 DIAGNOSIS — N179 Acute kidney failure, unspecified: Principal | ICD-10-CM

## 2016-05-14 DIAGNOSIS — I38 Endocarditis, valve unspecified: Secondary | ICD-10-CM

## 2016-05-14 DIAGNOSIS — D638 Anemia in other chronic diseases classified elsewhere: Secondary | ICD-10-CM

## 2016-05-14 DIAGNOSIS — D571 Sickle-cell disease without crisis: Secondary | ICD-10-CM

## 2016-05-14 DIAGNOSIS — N3000 Acute cystitis without hematuria: Secondary | ICD-10-CM

## 2016-05-14 DIAGNOSIS — G9340 Encephalopathy, unspecified: Secondary | ICD-10-CM

## 2016-05-14 LAB — ECHOCARDIOGRAM COMPLETE
AO mean calculated velocity dopler: 158 cm/s
AOVTI: 50.5 cm
AV Mean grad: 12 mmHg
AV VEL mean LVOT/AV: 0.49
AV area mean vel ind: 0.81 cm2/m2
AV pk vel: 223 cm/s
AVA: 1.78 cm2
AVAREAMEANV: 1.54 cm2
AVAREAVTI: 1.63 cm2
AVAREAVTIIND: 0.94 cm2/m2
AVLVOTPG: 5 mmHg
AVPG: 20 mmHg
Ao pk vel: 0.52 m/s
CHL CUP AV PEAK INDEX: 0.86
CHL CUP AV VALUE AREA INDEX: 0.94
CHL CUP AV VEL: 1.78
CHL CUP DOP CALC LVOT VTI: 28.7 cm
CHL CUP MV M VEL: 78
CHL CUP TV REG PEAK VELOCITY: 436 cm/s
E decel time: 394 msec
E/e' ratio: 12.5
FS: 34 % (ref 28–44)
HEIGHTINCHES: 63 in
IV/PV OW: 1.01
LA ID, A-P, ES: 51 mm
LA diam index: 2.68 cm/m2
LA vol A4C: 208 ml
LA vol: 208 mL
LAVOLIN: 109.5 mL/m2
LEFT ATRIUM END SYS DIAM: 51 mm
LV E/e' medial: 12.5
LV E/e'average: 12.5
LV PW d: 15.3 mm — AB (ref 0.6–1.1)
LV e' LATERAL: 9.68 cm/s
LVOT MV VTI INDEX: 0.82 cm2/m2
LVOT MV VTI: 1.55
LVOT area: 3.14 cm2
LVOT diameter: 20 mm
LVOT peak VTI: 0.57 cm
LVOT peak vel: 116 cm/s
LVOTSV: 90 mL
MV Annulus VTI: 58.3 cm
MV Dec: 394
MV pk A vel: 83.4 m/s
MV pk E vel: 121 m/s
MVAP: 2 cm2
MVG: 3 mmHg
MVPG: 6 mmHg
P 1/2 time: 110 ms
P 1/2 time: 326 ms
RV LATERAL S' VELOCITY: 16.4 cm/s
RV sys press: 91 mmHg
TDI e' lateral: 9.68
TDI e' medial: 6.2
TRMAXVEL: 436 cm/s
WEIGHTICAEL: 3082.91 [oz_av]

## 2016-05-14 LAB — BLOOD GAS, ARTERIAL
Acid-base deficit: 2.2 mmol/L — ABNORMAL HIGH (ref 0.0–2.0)
Bicarbonate: 23.4 mmol/L (ref 20.0–28.0)
DRAWN BY: 11249
O2 Content: 2 L/min
O2 Saturation: 93.4 %
PH ART: 7.309 — AB (ref 7.350–7.450)
Patient temperature: 97.7
pCO2 arterial: 47.8 mmHg (ref 32.0–48.0)
pO2, Arterial: 78 mmHg — ABNORMAL LOW (ref 83.0–108.0)

## 2016-05-14 LAB — CBC WITH DIFFERENTIAL/PLATELET
BLASTS: 0 %
Band Neutrophils: 0 %
Basophils Absolute: 0 10*3/uL (ref 0.0–0.1)
Basophils Relative: 0 %
Eosinophils Absolute: 0 10*3/uL (ref 0.0–0.7)
Eosinophils Relative: 0 %
HEMATOCRIT: 17.6 % — AB (ref 36.0–46.0)
Hemoglobin: 6.1 g/dL — CL (ref 12.0–15.0)
LYMPHS PCT: 7 %
Lymphs Abs: 1 10*3/uL (ref 0.7–4.0)
MCH: 31.6 pg (ref 26.0–34.0)
MCHC: 34.7 g/dL (ref 30.0–36.0)
MCV: 91.2 fL (ref 78.0–100.0)
MONO ABS: 1.6 10*3/uL — AB (ref 0.1–1.0)
MYELOCYTES: 0 %
Metamyelocytes Relative: 0 %
Monocytes Relative: 11 %
NEUTROS PCT: 82 %
NRBC: 15 /100{WBCs} — AB
Neutro Abs: 12 10*3/uL — ABNORMAL HIGH (ref 1.7–7.7)
OTHER: 0 %
PROMYELOCYTES ABS: 0 %
Platelets: 184 10*3/uL (ref 150–400)
RBC: 1.93 MIL/uL — AB (ref 3.87–5.11)
RDW: 29.6 % — AB (ref 11.5–15.5)
WBC: 14.6 10*3/uL — AB (ref 4.0–10.5)

## 2016-05-14 LAB — BASIC METABOLIC PANEL
Anion gap: 11 (ref 5–15)
BUN: 98 mg/dL — AB (ref 6–20)
CHLORIDE: 107 mmol/L (ref 101–111)
CO2: 22 mmol/L (ref 22–32)
CREATININE: 3.87 mg/dL — AB (ref 0.44–1.00)
Calcium: 9.6 mg/dL (ref 8.9–10.3)
GFR calc Af Amer: 13 mL/min — ABNORMAL LOW (ref >60)
GFR calc non Af Amer: 11 mL/min — ABNORMAL LOW (ref >60)
GLUCOSE: 113 mg/dL — AB (ref 65–99)
Potassium: 5.7 mmol/L — ABNORMAL HIGH (ref 3.5–5.1)
Sodium: 140 mmol/L (ref 135–145)

## 2016-05-14 LAB — COMPREHENSIVE METABOLIC PANEL
ALBUMIN: 3.1 g/dL — AB (ref 3.5–5.0)
ALK PHOS: 148 U/L — AB (ref 38–126)
ALT: 11 U/L — ABNORMAL LOW (ref 14–54)
ANION GAP: 8 (ref 5–15)
AST: 25 U/L (ref 15–41)
BILIRUBIN TOTAL: 15.4 mg/dL — AB (ref 0.3–1.2)
BUN: 100 mg/dL — ABNORMAL HIGH (ref 6–20)
CALCIUM: 9.7 mg/dL (ref 8.9–10.3)
CO2: 24 mmol/L (ref 22–32)
Chloride: 110 mmol/L (ref 101–111)
Creatinine, Ser: 4.01 mg/dL — ABNORMAL HIGH (ref 0.44–1.00)
GFR calc non Af Amer: 11 mL/min — ABNORMAL LOW (ref >60)
GFR, EST AFRICAN AMERICAN: 13 mL/min — AB (ref >60)
GLUCOSE: 105 mg/dL — AB (ref 65–99)
POTASSIUM: 5.5 mmol/L — AB (ref 3.5–5.1)
Sodium: 142 mmol/L (ref 135–145)
TOTAL PROTEIN: 6.7 g/dL (ref 6.5–8.1)

## 2016-05-14 LAB — URINALYSIS, ROUTINE W REFLEX MICROSCOPIC
Bacteria, UA: NONE SEEN
GLUCOSE, UA: NEGATIVE mg/dL
HGB URINE DIPSTICK: NEGATIVE
KETONES UR: NEGATIVE mg/dL
Nitrite: NEGATIVE
Protein, ur: NEGATIVE mg/dL
Specific Gravity, Urine: 1.011 (ref 1.005–1.030)
pH: 5 (ref 5.0–8.0)

## 2016-05-14 LAB — TYPE AND SCREEN
ABO/RH(D): O POS
Antibody Screen: NEGATIVE

## 2016-05-14 LAB — BILIRUBIN, FRACTIONATED(TOT/DIR/INDIR)
Bilirubin, Direct: 11.5 mg/dL — ABNORMAL HIGH (ref 0.1–0.5)
Indirect Bilirubin: 7.7 mg/dL — ABNORMAL HIGH (ref 0.3–0.9)
Total Bilirubin: 19.2 mg/dL (ref 0.3–1.2)

## 2016-05-14 LAB — CBC
HEMATOCRIT: 18.2 % — AB (ref 36.0–46.0)
HEMOGLOBIN: 6.3 g/dL — AB (ref 12.0–15.0)
MCH: 32.1 pg (ref 26.0–34.0)
MCHC: 34.6 g/dL (ref 30.0–36.0)
MCV: 92.9 fL (ref 78.0–100.0)
Platelets: 178 10*3/uL (ref 150–400)
RBC: 1.96 MIL/uL — ABNORMAL LOW (ref 3.87–5.11)
RDW: 28.3 % — ABNORMAL HIGH (ref 11.5–15.5)
WBC: 16.1 10*3/uL — ABNORMAL HIGH (ref 4.0–10.5)

## 2016-05-14 LAB — GLUCOSE, CAPILLARY: Glucose-Capillary: 107 mg/dL — ABNORMAL HIGH (ref 65–99)

## 2016-05-14 LAB — RETICULOCYTES
RBC.: 1.93 MIL/uL — AB (ref 3.87–5.11)
Retic Ct Pct: >23 % — ABNORMAL HIGH (ref 0.4–3.1)

## 2016-05-14 LAB — LACTIC ACID, PLASMA: Lactic Acid, Venous: 0.6 mmol/L (ref 0.5–1.9)

## 2016-05-14 LAB — RENAL FUNCTION PANEL
ANION GAP: 10 (ref 5–15)
Albumin: 2.8 g/dL — ABNORMAL LOW (ref 3.5–5.0)
BUN: 87 mg/dL — ABNORMAL HIGH (ref 6–20)
CHLORIDE: 112 mmol/L — AB (ref 101–111)
CO2: 22 mmol/L (ref 22–32)
CREATININE: 3.59 mg/dL — AB (ref 0.44–1.00)
Calcium: 9.8 mg/dL (ref 8.9–10.3)
GFR calc non Af Amer: 12 mL/min — ABNORMAL LOW (ref >60)
GFR, EST AFRICAN AMERICAN: 14 mL/min — AB (ref >60)
Glucose, Bld: 89 mg/dL (ref 65–99)
Phosphorus: 6.5 mg/dL — ABNORMAL HIGH (ref 2.5–4.6)
Potassium: 5.4 mmol/L — ABNORMAL HIGH (ref 3.5–5.1)
Sodium: 144 mmol/L (ref 135–145)

## 2016-05-14 LAB — PROTIME-INR
INR: 1.25
PROTHROMBIN TIME: 15.7 s — AB (ref 11.4–15.2)

## 2016-05-14 LAB — LACTATE DEHYDROGENASE: LDH: 584 U/L — AB (ref 98–192)

## 2016-05-14 LAB — D-DIMER, QUANTITATIVE (NOT AT ARMC): D DIMER QUANT: 2.85 ug{FEU}/mL — AB (ref 0.00–0.50)

## 2016-05-14 LAB — AMMONIA: Ammonia: 46 umol/L — ABNORMAL HIGH (ref 9–35)

## 2016-05-14 LAB — ABO/RH: ABO/RH(D): O POS

## 2016-05-14 LAB — BRAIN NATRIURETIC PEPTIDE: B NATRIURETIC PEPTIDE 5: 911.7 pg/mL — AB (ref 0.0–100.0)

## 2016-05-14 LAB — FIBRINOGEN: FIBRINOGEN: 717 mg/dL — AB (ref 210–475)

## 2016-05-14 LAB — SAVE SMEAR

## 2016-05-14 LAB — MRSA PCR SCREENING: MRSA by PCR: NEGATIVE

## 2016-05-14 MED ORDER — MORPHINE SULFATE 15 MG PO TABS
15.0000 mg | ORAL_TABLET | Freq: Three times a day (TID) | ORAL | Status: DC | PRN
  Administered 2016-05-15 – 2016-05-18 (×4): 15 mg via ORAL
  Filled 2016-05-14 (×4): qty 1

## 2016-05-14 MED ORDER — MORPHINE SULFATE 15 MG PO TABS: 15.0000 mg | ORAL_TABLET | Freq: Four times a day (QID) | ORAL | Status: DC | PRN

## 2016-05-14 MED ORDER — FUROSEMIDE 40 MG PO TABS: 80.0000 mg | ORAL_TABLET | Freq: Two times a day (BID) | ORAL | Status: DC

## 2016-05-14 MED ORDER — MORPHINE SULFATE 15 MG PO TABS
15.0000 mg | ORAL_TABLET | Freq: Once | ORAL | Status: AC
Start: 2016-05-14 — End: 2016-05-14
  Administered 2016-05-14: 15 mg via ORAL
  Filled 2016-05-14: qty 1

## 2016-05-14 MED ORDER — SODIUM POLYSTYRENE SULFONATE 15 GM/60ML PO SUSP
30.0000 g | Freq: Once | ORAL | Status: DC
  Filled 2016-05-14: qty 120

## 2016-05-14 MED ORDER — SODIUM POLYSTYRENE SULFONATE 15 GM/60ML PO SUSP
30.0000 g | Freq: Once | ORAL | Status: AC
Start: 2016-05-14 — End: 2016-05-14
  Administered 2016-05-14: 30 g via RECTAL
  Filled 2016-05-14 (×2): qty 120

## 2016-05-14 MED ORDER — FUROSEMIDE 40 MG PO TABS
80.0000 mg | ORAL_TABLET | Freq: Two times a day (BID) | ORAL | Status: DC
  Administered 2016-05-14: 80 mg via ORAL
  Filled 2016-05-14: qty 2

## 2016-05-14 MED ORDER — DEXTROSE 5 % IV SOLN
2.0000 g | INTRAVENOUS | Status: DC
  Administered 2016-05-14 – 2016-05-17 (×3): 2 g via INTRAVENOUS
  Filled 2016-05-14 (×4): qty 2

## 2016-05-14 MED ORDER — DARBEPOETIN ALFA 200 MCG/0.4ML IJ SOSY
300.0000 ug | PREFILLED_SYRINGE | Freq: Once | INTRAMUSCULAR | Status: AC
  Administered 2016-05-14: 300 ug via SUBCUTANEOUS
  Filled 2016-05-14: qty 0.8

## 2016-05-14 NOTE — Care Management Note (Signed)
Case Management Note  Patient Details  Name: Jane Short MRN: [id number] Date of Birth: [birth year]  Subjective/Objective:    ssc crisis                Action/Plan:Date:  May 14, 2016 Chart reviewed for concurrent status and case management needs. Will continue to follow patient progress. Discharge Planning: following for needs Expected discharge date: [id number] Betzaida Cremeens, BSN, [place], [place]   Expected Discharge Date:                  Expected Discharge Plan:  Home/Self Care  In-House Referral:     Discharge planning Services     Post Acute Care Choice:    Choice offered to:     DME Arranged:    DME Agency:     HH Arranged:    [place] Agency:     Status of Service:  In process, will continue to follow  If discussed at Long Length of Stay Meetings, dates discussed:    Additional Comments:  Leeroy Cha, RN 05/14/2016, 10:51 AM

## 2016-05-14 NOTE — Progress Notes (Signed)
Report given to nurse in stepdown icu

## 2016-05-14 NOTE — Progress Notes (Addendum)
Patient ID: Jane Short, female   DOB: 01/21/1951, 65 y.o.   MRN: [id number]   She is alert and oriented x three. She was able to roll in the bed without any significant pain which is an improvement in pain and mobility since this morning. However she was noted to have jerking which is new this afternoon and c/o of feeling nausea. I have consulted Nephrology to see patient Dr. Jonnie Finner in on his way to see patient. I have discussed with him that I am concerned about uremia as she appears euvolemic and has had an increase in BUN since yesterday morning. He advised to gold on any fluids until he has evaluated patient.    4:28 pm. Spoke with Dr. Jonnie Finner who feels that patient will require Dialysis as he feels that she is uremic. He agrees with holding any further diuretics and to also hold on giving any fluids. She will be transferred to [facility] for dialysis within 24 hours. Renal panel ordered for this afternoon and urine culture sent this morning. Will order ammonia level.  Transfer orders written. Please page Hospitalist when patient arrives to floor.   MATTHEWS,MICHELLE A.

## 2016-05-14 NOTE — Progress Notes (Signed)
Patient transferred to [facility] via [place]. Patient alert and oriented, vital signs stable, no pain reported from patient. Patient husband at bedside and has patient belongings bag.

## 2016-05-14 NOTE — Consult Note (Signed)
Renal Service Consult Note [facility]  Jane Short 05/14/2016 [place] Requesting Physician:  Dr Zigmund Daniel  Reason for Consult:  CKD pt with AMS, sickle cell pt HPI: The patient is a 65 y.o. 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  Pt and husband have a home in [place] and are coming to [place] during the week for "shots to keep her blood counts up" which she gets on M-W-F, then going back to [place] on the weekends.  She was admitted with c/o N/V for 4-5 days and then developed AMS for 24-48.  On admit her BUN is 90-100 range, and her creat is 3.5- 4.0 range.  CXR clear and no vol issues per primary.  Has chronic jaundice from SCD.  Asked to see for renal failure.   Husband says pt w/o any recent change in SOB, no chest pain, prod cough, f/c/s or diarrhea.  + N/V that started on Sunday.  Poor appetitie and energy.  PT doesn't provide any history.    ROS  denies CP  no joint pain   no HA  no blurry vision  no rash  no diarrhea  no nausea/ vomiting  no dysuria  no difficulty voiding  no change in urine color    Past Medical History  Past Medical History:  Diagnosis Date  . Anemia   . 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 Cheree Ditto at [facility]  . CHF (congestive heart failure) ([place])   . Congestive heart failure (CHF) ([place])   . Congestive heart failure (CHF) ([place]) 06/2015  . COPD (chronic obstructive pulmonary disease) (HCC)    symptoms Dr. Patricia Pesa   . Gout   . Hypertension   . Lichen planus   . Osteoporosis 2011   osteopenia  . SCA-1 (spinocerebellar ataxia type 1) ([place])   . Sickle cell anemia (HCC)    sickle cell disease  . Sickle cell anemia (HCC)   . Thrombocytopenia ([place])   . Vitamin D deficiency    Past Surgical History  Past Surgical History:  Procedure Laterality Date  . AV FISTULA PLACEMENT Left 02/27/2016   Procedure: INSERTION OF ARTERIOVENOUS (AV) GORE-TEX GRAFT ARM ( FOREARM LOOP );   Surgeon: Algernon Huxley, MD;  Location: [facility] ORS;  Service: Vascular;  Laterality: Left;  . CHOLECYSTECTOMY    . COLONOSCOPY  2014   [facility]  . EYE SURGERY     cataract bilateral  . GALLBLADDER SURGERY    . PERIPHERAL VASCULAR CATHETERIZATION Left 06/14/2015   Procedure: Dialysis/Perma Catheter Insertion;  Surgeon: Katha Cabal, MD;  Location: [place] CV LAB;  Service: Cardiovascular;  Laterality: Left;  . PERIPHERAL VASCULAR CATHETERIZATION N/A 01/23/2016   Procedure: Glori Luis Cath Insertion;  Surgeon: Algernon Huxley, MD;  Location: [place] CV LAB;  Service: Cardiovascular;  Laterality: N/A;  . PERIPHERAL VASCULAR CATHETERIZATION Left 03/30/2016   Procedure: A/V Shuntogram/Fistulagram;  Surgeon: Algernon Huxley, MD;  Location: [place] CV LAB;  Service: Cardiovascular;  Laterality: Left;  . TUBAL LIGATION     Family History  Family History  Problem Relation Age of Onset  . Heart disease Father   . Diabetes Father   . Diabetes Sister   . Cancer Mother     breast cancer  . Cancer Maternal Aunt     Breast cancer   Social History  reports that she quit smoking about 12 months ago. Her smoking use  included Cigarettes. She has a 7.50 pack-year smoking history. She has never used smokeless tobacco. She reports that she does not drink alcohol or use drugs. Allergies  Allergies  Allergen Reactions  . Ramipril     cough  . Septra [Sulfamethoxazole-Trimethoprim]     Joint pain   Home medications Prior to Admission medications   Medication Sig Start Date End Date Taking? Authorizing Provider  amiodarone (PACERONE) 100 MG tablet Take 100 mg by mouth daily.   Yes Historical Provider, MD  calcitRIOL (ROCALTROL) 0.25 MCG capsule Take 0.25 mcg by mouth daily.   Yes Historical Provider, MD  epoetin alfa (EPOGEN,PROCRIT) 60454 UNIT/ML injection Inject into the skin 3 (three) times a week.   Yes Historical Provider, MD  fluticasone furoate-vilanterol (BREO ELLIPTA) 200-25 MCG/INH AEPB  Inhale 1 puff into the lungs daily. 02/27/16  Yes Flora Lipps, MD  folic acid (FOLVITE) 1 MG tablet Take 1 mg by mouth daily.   Yes Historical Provider, MD  furosemide (LASIX) 80 MG tablet Take 80 mg by mouth 2 (two) times daily. 03/16/16  Yes Historical Provider, MD  hydroxyurea (HYDREA) 500 MG capsule Take 1 capsule (500 mg total) by mouth daily. May take with food to minimize GI side effects. 10/10/15  Yes Cammie Sickle, MD  OXYGEN Inhale into the lungs.   Yes Historical Provider, MD  sodium bicarbonate 650 MG tablet Take 650 mg by mouth 2 (two) times daily.    Yes Historical Provider, MD   Liver Function Tests  Recent Labs Lab 05/11/16 1523 05/12/16 0235 05/13/16 2343 05/14/16 0234  AST 47* 40 25  --   ALT 15 12* 11*  --   ALKPHOS 150* 142* 148*  --   BILITOT 13.0* 14.1* 15.4* 19.2*  PROT 7.2 7.0 6.7  --   ALBUMIN 3.4* 3.4* 3.1*  --    No results for input(s): LIPASE, AMYLASE in the last 168 hours. CBC  Recent Labs Lab 05/12/16 0235 05/13/16 2343 05/14/16 0243  WBC 12.5* 14.6* 16.1*  NEUTROABS  --  12.0*  --   HGB 7.6* 6.1* 6.3*  HCT 21.5* 17.6* 18.2*  MCV 93.5 91.2 92.9  PLT 214 184 0000000   Basic Metabolic Panel  Recent Labs Lab 05/11/16 1523 05/12/16 0235 05/13/16 0118 05/13/16 2343 05/14/16 0243  NA 137 137 140 142 140  K 5.8* 5.7* 4.6 5.5* 5.7*  CL 105 104 116* 110 107  CO2 21* 23 17* 24 22  GLUCOSE 101* 97 82 105* 113*  BUN 80* 84* 70* 100* 98*  CREATININE 4.06* 4.25* 3.24* 4.01* 3.87*  CALCIUM 9.2 9.0 7.0* 9.7 9.6   Iron/TIBC/Ferritin/ %Sat    Component Value Date/Time   IRON 166 04/20/2016 0938   IRON 127 05/30/2014 0920   TIBC 324 04/20/2016 0938   TIBC 306 05/30/2014 0920   FERRITIN 477 (H) 04/20/2016 0938   FERRITIN 175 05/30/2014 0920   IRONPCTSAT 51 (H) 04/20/2016 0938   IRONPCTSAT 42 05/30/2014 0920    Vitals:   05/14/16 1040 05/14/16 1200 05/14/16 1400 05/14/16 1600  BP:  (!) 132/48 (!) 113/38   Pulse: 65 69 67   Resp: 12  (!) 34 15   Temp:  98 F (36.7 C)  98.2 F (36.8 C)  TempSrc:  Oral  Oral  SpO2: 97% 100% 97%   Weight: 87.4 kg (192 lb 10.9 oz)     Height: 5\' 3"  (1.6 m)      Exam Gen confused, jerking UE's  No rash,  cyanosis or gangrene Sclera anicteric, throat clear slightly dry  Mild JVD Chest clear bilat, no rales or wheezing RRR loud SEM, no RG Abd soft ntnd no mass or ascites +bs  Liver down 6cm GU defer MS no joint effusions or deformity Ext no LE or UE edema / no wounds or ulcers Neuro is lethargic, arousable, is fully disoriented, mild bilat UE asterixis    Assessment: 1. Altered mental status/ asterixis - in pt with CKD stage 4/5 f/b Dr Rolly Salter. Recent onset N/V then confusion and now with asterixis and significant azotemia, this is most likely all uremia related.  Not sure if she has liver failure, this is the other thing that would look similar (except the N/V).  Recommend dialysis within 24 hrs. Will have pt transferred to [facility] and plan to start HD tomorrow w temp cath placement. Her AVF is not audible in the LUE.   2. Sickle cell disease 3. COPD 4. Hx CHF 5. Sickle cell disease/ anemia    Plan - as above  Kelly Splinter MD [place] pager [phone]   05/14/2016, 4:25 PM

## 2016-05-14 NOTE — Progress Notes (Signed)
Patient arrived from [facility] to [facility] stable. Patient went to MRI and arrived back to floor and Nephrology paged Dr.Powell that patient is on floor per order.

## 2016-05-14 NOTE — Progress Notes (Signed)
SICKLE CELL SERVICE PROGRESS NOTE  Jane Short [id number] DOB: 1951-05-24 DOA: 05/13/2016 PCP: Tommi Rumps, MD  Assessment/Plan: Principal Problem:   Acute encephalopathy Active Problems:   Left hip pain   Elevated bilirubin   Sickle cell crisis (HCC)   AKI (acute kidney injury) ([place])   CKD (chronic kidney disease) stage 5, GFR less than 15 ml/min (HCC)   Valvular heart disease   Acute respiratory failure with hypoxia ([place])  1. Acute Back Pain: Pt has findings of diffuse bulge at L4-L5 (as discussed with Radiologist) which is in the region of her pain. Will obtain an MRI of the lumbar region.  2. Anemia of Chronic Disease: Pt requires intermittent blood transfusion and Epogen TIW. Per her husband her last transfusion was about 11/2 months ago. Baseline Hb 5.6 3. Hypoxia: It is unclear why she is hypoxic as she does not appear clinically voulem overloaded. This soul be effect of the opiates and some degree of hypoventilation.  Will check her saturations on RA when awake and if still hypoxic will consider evaluatingf for possible PE.  4. Acute on CKD IV:  Her baseline Cr is 3.8 and baseline BUN 56. Currently BUN is 98 with Cr. Of 3.87. Will obtain a 24 hour urine collection and consult with Nephrology. Will see how she responds to lasix as she is cardiorenal and if he rrenal function worsens, will consider IVF.  Will also obtain 2-D ECHO. Continue Rocaltrol and Sodium Bicarbonate.  5. Chronic Hemolysis: Pt would benefit form a therapeutic dose of Hydrea to induce a more stable population of Hb however per her records she has a h/o Lichen Planus associate with Hydrea use. There is no data to support that subtheapeutic  6. Hb SS without crisis: Pt is opiate naive and has been using Aleve for her hip pains. She however has CKD IV and NSAID's are contraindicated. Continue Folic Acid.  7. Hyperkalemia: Will give lasix and recheck labs this evening. If potassium still elevated will give  kayexalate 8. Elevated BNP: Un interpretable in a setting of CKD and Cardiac Valvular disease. 9. UTI: U/A findings consistent with UTI . Will start on Rocephin and send urine for culture.  10. Leukocytosis:  Likely secondary to UTI  11. Valvular Heart Disease with Mild decrease in Systolic Function: Continue Pacerone and lasix.  Obtain 2-ECHO.  12. 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  13. Atrial Mass: Likely Myxoma.  14. Lichen Planus:Lesions appear healed 15. Chronic pain: Pt usually takes Aleve for chronic pain in her hips despite CKD and being advised against taking this.    Code Status: Full Code Family Communication: N/A Disposition Plan: Not yet ready for discharge  [place].  Pager [phone]. If 7PM-7AM, please contact night-coverage.  05/14/2016, 10:22 AM  LOS: 1 day   Interim History: This is an opiate naive patieNT with Hb ss, CKD IV, Aortic and A-V Valvular disease, CHF, Idiopathic Pulmonary HTN and OSA who was admitted to [facility] with hemolytic anemia and sickle cell crisis. Per her husband and confirmed by patient, her pain began when she had a car ride to their home in [place]. She was seen in the ED as the pain was excruciating and she was unable to tolerate it. She received "a shot" and was discharged with a prescription for Morphine 15 mg. She states that she took about 4 doses but had about 3 episodes of emesis which she attributes to the Morphine. However their is not a strong temporal relationship  between Morphine and emesis. She did have significant itching with the Morphine. She was seen by her Hematologist in the office and found to have a Hb of 5.0 g/dL so was sent to the hospital for further evaluation.  At [facility], she was transfused 2 units RBC's for a 5.0 g/dL which improved to 7.6 g/dL after transfusion. Today her Hb is 6.1 g/dL. Per her records, her baseline Hb is  She normally takes . However her LDH is down to 646 from 901.   She also has chronically elevated bilirubin likely due to chronic hemolysis. She reports that her dry weight is 167 #'s and she takes Lasix 80 mg daily expect when her weight is increased she takes it BID. He last lasix was 6 days ago and her weight today is 192 #'s. However a review of office visits in the last year show weights of 194 3's.   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 I have also reviewed and a TEE done by Dr. 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 However per her husband, it was felt that surgery would be a greater risk to the patient given her overall medical condition. Review of her Hb electrophoresis from [facility] is consistent with Hb SS ini a transfused patient. She also has a h/o idiopathic pul;monary HTN and she a smoking history but is not a current smoker. However has a Dx of COPD and is on Ellipta.  Review of labs shows previous BNP of 911. BNP to be 680 and as high as 901 in the past.   Consultants:  None  Procedures:  None  Antibiotics:  Rocephin 12/14 >>   Objective: Vitals:   05/13/16 2144 05/14/16 0202 05/14/16 0400 05/14/16 0800  BP: (!) 121/53 122/61 (!) 139/45 (!) 126/44  Pulse: 65 (!) 122 72 66  Resp: 12 16 12  (!) 21  Temp: 98.2 F (36.8 C) 98.8 F (37.1 C)  97.6 F (36.4 C)  TempSrc: Oral Axillary  Axillary  SpO2: 95% 95% 98% 96%   Weight change:   Intake/Output Summary (Last 24 hours) at 05/14/16 1022 Last data filed at 05/14/16 0700  Gross per 24 hour  Intake           462.92 ml  Output              650 ml  Net          -187.08 ml    General: Alert, awake, oriented x3, in acute distress from pain only with movement.  HEENT: [place]/AT PEERL, EOMI, icterus  Present. Neck: Trachea midline,  no masses, no thyromegal,y no JVD, no carotid bruit OROPHARYNX:  Moist, No exudate/ erythema/lesions.  Heart: Regular  rate and rhythm, without murmurs, rubs, gallops, PMI non-displaced, no heaves or thrills on palpation.  Lungs: Clear to auscultation, no wheezing or rhonchi noted. No increased vocal fremitus resonant to percussion  Abdomen: Soft, nontender, nondistended, positive bowel sounds, no masses no hepatosplenomegaly noted..  Neuro: No focal neurological deficits noted cranial nerves II through XII grossly intact.  Strength at baseline  in bilateral upper. However unable to evaluate strength in lower extremities due to pain in back.  Musculoskeletal: No warmth swelling or erythema around joints, no spinal tenderness noted, but patient does have tenderness noted adjacent to L4-L5 level.  Psychiatric: Patient alert and oriented x3, good insight and cognition, good recent  to remote recall.    Data Reviewed: Basic Metabolic Panel:  Recent Labs Lab 05/11/16 1523 05/12/16 0235 05/13/16 0118 05/13/16 2343 05/14/16 0243  NA 137 137 140 142 140  K 5.8* 5.7* 4.6 5.5* 5.7*  CL 105 104 116* 110 107  CO2 21* 23 17* 24 22  GLUCOSE 101* 97 82 105* 113*  BUN 80* 84* 70* 100* 98*  CREATININE 4.06* 4.25* 3.24* 4.01* 3.87*  CALCIUM 9.2 9.0 7.0* 9.7 9.6   Liver Function Tests:  Recent Labs Lab 05/11/16 1523 05/12/16 0235 05/13/16 2343 05/14/16 0234  AST 47* 40 25  --   ALT 15 12* 11*  --   ALKPHOS 150* 142* 148*  --   BILITOT 13.0* 14.1* 15.4* 19.2*  PROT 7.2 7.0 6.7  --   ALBUMIN 3.4* 3.4* 3.1*  --    No results for input(s): LIPASE, AMYLASE in the last 168 hours. No results for input(s): AMMONIA in the last 168 hours. CBC:  Recent Labs Lab 05/11/16 1010 05/12/16 0235 05/13/16 2343 05/14/16 0243  WBC  --  12.5* 14.6* 16.1*  NEUTROABS  --   --  12.0*  --   HGB 5.0* 7.6* 6.1* 6.3*  HCT 14.3* 21.5* 17.6* 18.2*  MCV  --  93.5 91.2 92.9  PLT  --  214 184 178   Cardiac Enzymes: No results for input(s): CKTOTAL, CKMB, CKMBINDEX, TROPONINI in the last 168 hours. BNP (last 3  results)  Recent Labs  06/05/15 0407 08/28/15 1216 05/13/16 2343  BNP 683.0* 697.0* 911.7*    ProBNP (last 3 results) No results for input(s): PROBNP in the last 8760 hours.  CBG:  Recent Labs Lab 05/14/16 0317  GLUCAP 107*    Recent Results (from the past 240 hour(s))  MRSA PCR Screening     Status: None   Collection Time: 05/14/16  3:57 AM  Result Value Ref Range Status   MRSA by PCR NEGATIVE NEGATIVE Final    Comment:        The GeneXpert MRSA Assay (FDA approved for NASAL specimens only), is one component of a comprehensive MRSA colonization surveillance program. It is not intended to diagnose MRSA infection nor to guide or monitor treatment for MRSA infections.      Studies: Ct Abdomen Pelvis Wo Contrast  Result Date: 05/14/2016 CLINICAL DATA:  65 y/o F; decreasing hemoglobin with concern for retroperitoneal hemorrhage or hemorrhage at the painful left hip. History of sickle cell disease and cholecystectomy. EXAM: CT ABDOMEN AND PELVIS WITHOUT CONTRAST TECHNIQUE: Multidetector CT imaging of the abdomen and pelvis was performed following the standard protocol without IV contrast. COMPARISON:  05/17/2015 CT abdomen and pelvis. FINDINGS: Lower chest: Severe cardiomegaly. Decreased attenuation of cardiac blood pool compatible with anemia. Hepatobiliary: No focal liver abnormality is seen. Status post cholecystectomy. No biliary dilatation. Pancreas: Unremarkable. No pancreatic ductal dilatation or surrounding inflammatory changes. Spleen: Status post splenectomy. Adrenals/Urinary Tract: Adrenal glands are unremarkable. Stable left kidney interpolar 26 mm cyst. Kidneys are otherwise normal, without renal calculi, focal lesion, or hydronephrosis. Bladder is unremarkable. Stomach/Bowel: Stomach is within normal limits. Appendix appears normal. No evidence of bowel wall thickening, distention, or inflammatory changes. Few scattered diverticulum of the colon.  Vascular/Lymphatic: Aortic atherosclerosis with moderate calcifications. No enlarged abdominal or pelvic lymph nodes. Reproductive: Uterus and bilateral adnexa are unremarkable. Other: No abdominal wall hernia or abnormality. No abdominopelvic ascites. Musculoskeletal: Mixed lucent and sclerotic foci in the right femoral head, left proximal femur, bilateral ilium, and small lucencies  in vertebral bodies probably representing multiple bone infarcts given history of sickle cell disease. IMPRESSION: 1. No hemorrhage identified. 2. Severe cardiomegaly. 3. Aortic atherosclerosis. 4. Bony stigmata of sickle cell disease with interval right femoral head avascular necrosis. Electronically Signed   By: Kristine Garbe M.D.   On: 05/14/2016 05:18   Dg Chest 1 View  Result Date: 05/13/2016 CLINICAL DATA:  Hypoxia, history sickle cell anemia, COPD EXAM: CHEST 1 VIEW COMPARISON:  Portable exam 1550 hours compared 06/22/2015 FINDINGS: LEFT jugular Port-A-Cath with tip projecting over SVC. Enlargement of cardiac silhouette with pulmonary vascular congestion. Aortic atherosclerosis. Lungs grossly clear. No definite infiltrate, pleural effusion or pneumothorax. No acute osseous findings. IMPRESSION: Enlargement of cardiac silhouette with pulmonary vascular congestion. No acute abnormalities. Electronically Signed   By: Lavonia Dana M.D.   On: 05/13/2016 16:05   Ct Head Wo Contrast  Result Date: 05/14/2016 CLINICAL DATA:  65 year old female with acute encephalopathy. The patient is lethargic, disoriented, and generalized weakness. EXAM: CT HEAD WITHOUT CONTRAST TECHNIQUE: Contiguous axial images were obtained from the base of the skull through the vertex without intravenous contrast. COMPARISON:  None. FINDINGS: Brain: The ventricles and sulci are appropriate in size for patient's age. Mild periventricular and deep white matter chronic microvascular ischemic changes noted. There is no acute intracranial hemorrhage.  No mass effect or midline shift noted. Vascular: No hyperdense vessel or unexpected calcification. Skull: There is diffuse ground-glass appearance of the skull with "Salt and pepper" appearance may be related to underlying hyperparathyroidism. Multiple lucent lesions within the skull may represent brown tumor of the bone secondary to hyperparathyroidism. Correlation with clinical exam and parathyroid function tests recommended. Sinuses/Orbits: No acute finding. Other: None IMPRESSION: No acute intracranial pathology. Mild age-related atrophy and chronic microvascular ischemic changes. Ground-glass appearance of the skull suggestive of hyperparathyroidism. Correlation with clinical exam and blood calcium levels recommended. Electronically Signed   By: Anner Crete M.D.   On: 05/14/2016 05:17   Dg Hip Unilat With Pelvis 2-3 Views Left  Result Date: 05/11/2016 CLINICAL DATA:  Hip pain. EXAM: DG HIP (WITH OR WITHOUT PELVIS) 2-3V LEFT COMPARISON:  None. FINDINGS: There is no evidence of hip fracture or dislocation. There is no evidence of arthropathy or other focal bone abnormality. IMPRESSION: Normal left hip. Electronically Signed   By: Marijo Conception, M.D.   On: 05/11/2016 15:59    Scheduled Meds: . amiodarone  100 mg Oral Daily  . calcitRIOL  0.25 mcg Oral Daily  . fluticasone furoate-vilanterol  1 puff Inhalation Daily  . folic acid  1 mg Oral Daily  . hydroxyurea  500 mg Oral Daily  . pantoprazole  40 mg Oral Daily  . senna-docusate  1 tablet Oral BID  . sodium bicarbonate  650 mg Oral BID   Continuous Infusions:  Principal Problem:   Acute encephalopathy Active Problems:   Left hip pain   Elevated bilirubin   Sickle cell crisis (HCC)   AKI (acute kidney injury) ([place])   CKD (chronic kidney disease) stage 5, GFR less than 15 ml/min (HCC)   Valvular heart disease   Acute respiratory failure with hypoxia (HCC)    In excess of 90 minutes spent during this visit. 50% involved face  to face contact with the patient for assessment, counseling and coordination of care.

## 2016-05-14 NOTE — Progress Notes (Signed)
Pt HGB-6.1, paged sent to K.Schorr

## 2016-05-14 NOTE — Progress Notes (Signed)
Awaiting transfer to [facility] for Hemodialysis in AM. Incontinent of urine;  UTI, urgency and frequent for large amts. Oriented to name. Somulent. Denies pain Jaundiced . Portacath Left chest. Hemodynamics stable. Abnormal labs reported to Dr. Zigmund Daniel. Sats are 94 on 1 Liter O2. Husband at bedside.

## 2016-05-14 NOTE — Progress Notes (Signed)
Dr. Eulas Post made aware that patient refused to wear bipap. Pt. Alert/oriented to self at the present time. Patient husband at bedside and wants mask removed as well. Will continue to monitor.

## 2016-05-14 NOTE — Progress Notes (Signed)
RT attempted to placed on BIPAP per MD order. Patient isn't tolerating and takes it off. Patient stated she did not want to wear it. RN aware and MD aware. Patient sat 96% on 2L [place]. Patient in no distress.

## 2016-05-14 NOTE — Progress Notes (Signed)
Report to Coleen at [facility] # [place]. Awaiting [facility] transport.

## 2016-05-14 NOTE — Progress Notes (Signed)
CRITICAL VALUE ALERT  Critical value received:  Total Bilirubin 19.2  Date of notification:  05/14/16  Time of notification:  0630  Critical value read back:Yes.    Nurse who received alert:  Duard Larsen, RN  MD notified (1st page):  Dr. Lily Kocher  Time of first page:  0400

## 2016-05-14 NOTE — Progress Notes (Addendum)
Admission labs reviewed.  Documentation from [place] reviewed.  Records from [place] admission in January 2017 reviewed.  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  Patient re-examined.  She is still arousable but lethargic.  Apparently woke up long enough to complain of hip pain and has received one dose of IV dilaudid since admission.  However, she is still disoriented.  She follows commands with persistent prompting.  She has generalized weakness but exam appears to be nonfocal at this point.  No obvious hematomas at her left hip.  Breathing unlabored on 2L [place].    She still has an elevated reticulocyte count.  Hgb down again to 6, but this has been documented as her baseline.  Bilirubin elevated, but this is chronic.  She has mild hyperkalemia again with creatinine holding at 4.  Her pH is slightly low with mild CO2 retention.  She is already at risk for respiratory decompensation with her history of OSA.  I still think narcotics and renal dysfunction are contributing.  Knowing her previous history, I have added blood cultures, lactic acid, and STAT CT imaging of her head and A/P.  Will also check blood sugar (though glucose OK on BMP).  If lactic acid level elevated, will add empiric antibiotics.  We still do not have a urine sample sent although the patient has reportedly voided three times since admission.  She is critically ill and is being transferred to the stepdown unit with telemetry monitoring and continuous pulse oximetry.  Low threshold for PCCM consult.  She will need Nephrology consult in the AM.  She is a full code.  Additional care time: 60 minutes, 1:00AM - 2:00AM, including chart review, patient reassessment, coordinating plan of care, and direct counseling to patient's husband.

## 2016-05-14 NOTE — Progress Notes (Signed)
  Echocardiogram 2D Echocardiogram has been performed.  Jane Short 05/14/2016, 2:59 PM

## 2016-05-15 ENCOUNTER — Inpatient Hospital Stay: Payer: Medicare Other

## 2016-05-15 LAB — CBC WITH DIFFERENTIAL/PLATELET
BASOS ABS: 0 10*3/uL (ref 0.0–0.1)
Basophils Relative: 0 %
EOS ABS: 0.3 10*3/uL (ref 0.0–0.7)
Eosinophils Relative: 2 %
HCT: 18.5 % — ABNORMAL LOW (ref 36.0–46.0)
HEMOGLOBIN: 6.1 g/dL — AB (ref 12.0–15.0)
LYMPHS PCT: 2 %
Lymphs Abs: 0.3 10*3/uL — ABNORMAL LOW (ref 0.7–4.0)
MCH: 30.3 pg (ref 26.0–34.0)
MCHC: 33 g/dL (ref 30.0–36.0)
MCV: 92 fL (ref 78.0–100.0)
MONOS PCT: 12 %
Monocytes Absolute: 1.6 10*3/uL — ABNORMAL HIGH (ref 0.1–1.0)
NEUTROS PCT: 84 %
Neutro Abs: 10.8 10*3/uL — ABNORMAL HIGH (ref 1.7–7.7)
PLATELETS: 177 10*3/uL (ref 150–400)
RBC: 2.01 MIL/uL — ABNORMAL LOW (ref 3.87–5.11)
RDW: 28.2 % — ABNORMAL HIGH (ref 11.5–15.5)
WBC: 13 10*3/uL — ABNORMAL HIGH (ref 4.0–10.5)

## 2016-05-15 LAB — COMPREHENSIVE METABOLIC PANEL
ALT: 12 U/L — ABNORMAL LOW (ref 14–54)
ANION GAP: 11 (ref 5–15)
AST: 27 U/L (ref 15–41)
Albumin: 2.4 g/dL — ABNORMAL LOW (ref 3.5–5.0)
Alkaline Phosphatase: 137 U/L — ABNORMAL HIGH (ref 38–126)
BUN: 83 mg/dL — ABNORMAL HIGH (ref 6–20)
CALCIUM: 10.2 mg/dL (ref 8.9–10.3)
CHLORIDE: 110 mmol/L (ref 101–111)
CO2: 24 mmol/L (ref 22–32)
Creatinine, Ser: 3.71 mg/dL — ABNORMAL HIGH (ref 0.44–1.00)
GFR calc non Af Amer: 12 mL/min — ABNORMAL LOW (ref >60)
GFR, EST AFRICAN AMERICAN: 14 mL/min — AB (ref >60)
Glucose, Bld: 92 mg/dL (ref 65–99)
POTASSIUM: 5 mmol/L (ref 3.5–5.1)
SODIUM: 145 mmol/L (ref 135–145)
Total Bilirubin: 15.9 mg/dL — ABNORMAL HIGH (ref 0.3–1.2)
Total Protein: 6.1 g/dL — ABNORMAL LOW (ref 6.5–8.1)

## 2016-05-15 LAB — HEPATITIS B SURFACE ANTIGEN: Hepatitis B Surface Ag: NEGATIVE

## 2016-05-15 LAB — HAPTOGLOBIN: Haptoglobin: <10 mg/dL — ABNORMAL LOW (ref 34–200)

## 2016-05-15 LAB — RETICULOCYTES
RBC.: 2.01 MIL/uL — AB (ref 3.87–5.11)
RETIC COUNT ABSOLUTE: 442.2 10*3/uL — AB (ref 19.0–186.0)
RETIC CT PCT: 22 % — AB (ref 0.4–3.1)

## 2016-05-15 LAB — HEPATITIS B SURFACE ANTIBODY,QUALITATIVE: Hep B S Ab: NONREACTIVE

## 2016-05-15 LAB — HEPATITIS B CORE ANTIBODY, IGM: HEP B C IGM: NEGATIVE

## 2016-05-15 LAB — ABO/RH: ABO/RH(D): O POS

## 2016-05-15 MED ORDER — LIDOCAINE HCL (PF) 1 % IJ SOLN
INTRAMUSCULAR | Status: AC
  Filled 2016-05-15: qty 30

## 2016-05-15 MED ORDER — MORPHINE SULFATE 15 MG PO TABS
15.0000 mg | ORAL_TABLET | Freq: Once | ORAL | Status: DC
  Filled 2016-05-15: qty 1

## 2016-05-15 NOTE — Progress Notes (Signed)
SICKLE CELL SERVICE PROGRESS NOTE  Jane Short [id number] DOB: Sep 23, 1950 DOA: 05/13/2016 PCP: Tommi Rumps, MD  Assessment/Plan: Principal Problem:   Acute encephalopathy Active Problems:   Left hip pain   Elevated bilirubin   Sickle cell crisis (HCC)   AKI (acute kidney injury) ([place])   CKD (chronic kidney disease) stage 5, GFR less than 15 ml/min (HCC)   Valvular heart disease   Acute respiratory failure with hypoxia ([place])  1. Acute Back Pain:  MRI findings noted. Her pain is improved today. Will re-assess after she has dialysis and consider a course of steroids for the inflammation. She will need to be evaluated by Neurosurgery or Orthopedic surgery. She has not received any opiates since 1205 yesterday.  2. Anemia of Chronic Disease: Pt has anemia associated with  Baseline Hb 5.6. Hb 6.1 today . Will hold on transfusion. Aranesp given yesterday in place of Epogen.  3. Hypoxia: Saturations improved but patient still mildly hypoxic. Pt is euvolemic thus doubt associated with volume overload. This may be effect of the opiates and some degree of hypoventilation.  Will check her saturations on RA when awake and if still hypoxic will consider evaluatingf for possible PE.  4. Acute on CKD IV:  Her baseline Cr is 3.8 and baseline BUN 56. Currently BUN is 83 with Cr. Of 3.7. Pt to receive dialysis without removal of fluid today.  2-D ECHO results noted. Continue Rocaltrol and Sodium Bicarbonate.  5. Chronic Hemolysis: Pt would benefit from a therapeutic dose of Hydrea to induce a more stable population of Hb however per her records she has a h/o Lichen Planus associate with Hydrea use. There is no data to support that subtheapeutic  6. Hb SS without crisis: Pt is not in crisis.She is opiate naive and has been using Aleve for her hip pains. She however has CKD IV and NSAID's are contraindicated. Continue Folic Acid.  7. Hyperkalemia: Improved today and within normal range.  8. Elevated  BNP: Un interpretable in a setting of CKD and Cardiac Valvular disease. 9. UTI: Improved. Likely secondary to UTI. U/A findings consistent with UTI . On day #2 of Rocephin. Urine culture shows 40,000 colonies of Proteus Mirabilis. Will continue treatment althoug colony count low given her renal function. .  10. Leukocytosis: Improved.  Likely secondary to UTI  11. Gait abnormality and weakness: Will consult PT and OT.  12. Valvular Heart Disease with Mild decrease in Systolic Function: Continue Pacerone and lasix.  2-ECHO shows stable findings since last ECHO.  13. 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  15. Lichen Planus:Please note that nursing documented stage II pressure ulcer on RLE. However this is not a pressure ulcer but rather healed Lichen Planus lesions 16. Chronic pain: Pt usually takes Aleve for chronic pain in her hips despite CKD and being advised against taking this.    Code Status: Full Code Family Communication: Husband at bedside and clinical course and plans discussed with both patient and husband. Disposition Plan: Not yet ready for discharge  Cashae Weich A.  Pager [phone]. If 7PM-7AM, please contact night-coverage.  05/15/2016, 11:18 AM  LOS: 2 days   Interim History: This is an opiate naive patient with Hb ss, CKD IV, Aortic and A-V Valvular disease, CHF, Idiopathic Pulmonary HTN and OSA who was admitted to [facility] with hemolytic anemia and sickle cell crisis. Per her husband and confirmed by patient, her pain began when she had a car ride to their  home in [place]. She was seen in the ED as the pain was excruciating and she was unable to tolerate it. She received "a shot" and was discharged with a prescription for Morphine 15 mg. She states that she took about 4 doses but had about 3 episodes of emesis which she attributes to the Morphine. However their is not a strong temporal relationship between Morphine  and emesis. She did have significant itching with the Morphine. She was seen by her Hematologist in the office and found to have a Hb of 5.0 g/dL so was sent to the hospital for further evaluation.  At [facility], she was transfused 2 units RBC's for a 5.0 g/dL which improved to 7.6 g/dL after transfusion. Today her Hb is 6.1 g/dL. Per her records, her baseline Hb is  She normally takes . However her LDH is down to 646 from 901.  She also has chronically elevated bilirubin likely due to chronic hemolysis. She reports that her dry weight is 167 #'s and she takes Lasix 80 mg daily expect when her weight is increased she takes it BID. He last lasix was 6 days ago and her weight today is 192 #'s. However a review of office visits in the last year show weights of 194 3's.  Her clinical course was consistennt with Uremia and she was seen by Nephrology and transferred to [facility] for dialysis. She is much more awake today and reports that her pain is improved but still significant with movement. She reports that she feels much better overall. Her husband is at bedside and all questions asked and answered.   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 I have also reviewed and a TEE done by Dr. 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 However per her husband, it was felt that surgery would be a greater risk to the patient given her overall medical condition. Review of her Hb electrophoresis from [facility] is consistent with Hb SS ini a transfused patient. She also has a h/o idiopathic pul;monary HTN and she a smoking history but is not a current smoker. However has a Dx of COPD and is on Ellipta.  Review of labs shows previous BNP of 911. BNP to be 680 and as high as 901 in the past.   Consultants:  None  Procedures:  None  Antibiotics:  Rocephin 12/14 >>   Objective: Vitals:   05/14/16 2000  05/14/16 2100 05/15/16 0045 05/15/16 0400  BP:  (!) 123/48 (!) 112/43 140/80  Pulse: 75 71 78 81  Resp: 13 13 16 15   Temp:  98.8 F (37.1 C) 98.5 F (36.9 C) 98.8 F (37.1 C)  TempSrc:  Oral Oral Oral  SpO2: 97% 97% 100% 94%  Weight:  86.8 kg (191 lb 5.8 oz)    Height:  5\' 5"  (1.651 m)     Weight change:   Intake/Output Summary (Last 24 hours) at 05/15/16 1118 Last data filed at 05/14/16 1730  Gross per 24 hour  Intake              300 ml  Output              460 ml  Net             -160 ml    General: Alert, awake, oriented x3, in acute distress from pain only with movement.  HEENT: [place]/AT PEERL, EOMI, icterus present. Neck: Trachea midline,  no masses, no thyromegal,y no JVD, no carotid bruit OROPHARYNX:  Moist, No exudate/ erythema/lesions.  Heart: Regular rate and rhythm, without murmurs, rubs, gallops, PMI non-displaced, no heaves or thrills on palpation.  Lungs: Clear to auscultation, no wheezing or rhonchi noted. No increased vocal fremitus resonant to percussion  Abdomen: Soft, nontender, nondistended, positive bowel sounds, no masses no hepatosplenomegaly noted.  Neuro: No focal neurological deficits noted cranial nerves II through XII grossly intact.  Strength at baseline  in bilateral upper. However unable to evaluate strength in lower extremities due to pain in back.  Musculoskeletal: No warmth swelling or erythema around joints, no spinal tenderness noted, but patient does have tenderness noted adjacent to L4-L5 level.  Psychiatric: Patient alert and oriented x3, good insight and cognition, good recent to remote recall.    Data Reviewed: Basic Metabolic Panel:  Recent Labs Lab 05/13/16 0118 05/13/16 2343 05/14/16 0243 05/14/16 1717 05/15/16 0630  NA 140 142 140 144 145  K 4.6 5.5* 5.7* 5.4* 5.0  CL 116* 110 107 112* 110  CO2 17* 24 22 22 24   GLUCOSE 82 105* 113* 89 92  BUN 70* 100* 98* 87* 83*  CREATININE 3.24* 4.01* 3.87* 3.59* 3.71*  CALCIUM 7.0* 9.7  9.6 9.8 10.2  PHOS  --   --   --  6.5*  --    Liver Function Tests:  Recent Labs Lab 05/11/16 1523 05/12/16 0235 05/13/16 2343 05/14/16 0234 05/14/16 1717 05/15/16 0630  AST 47* 40 25  --   --  27  ALT 15 12* 11*  --   --  12*  ALKPHOS 150* 142* 148*  --   --  137*  BILITOT 13.0* 14.1* 15.4* 19.2*  --  15.9*  PROT 7.2 7.0 6.7  --   --  6.1*  ALBUMIN 3.4* 3.4* 3.1*  --  2.8* 2.4*   No results for input(s): LIPASE, AMYLASE in the last 168 hours.  Recent Labs Lab 05/14/16 1740  AMMONIA 46*   CBC:  Recent Labs Lab 05/11/16 1010 05/12/16 0235 05/13/16 2343 05/14/16 0243 05/15/16 0630  WBC  --  12.5* 14.6* 16.1* 13.0*  NEUTROABS  --   --  12.0*  --  10.8*  HGB 5.0* 7.6* 6.1* 6.3* 6.1*  HCT 14.3* 21.5* 17.6* 18.2* 18.5*  MCV  --  93.5 91.2 92.9 92.0  PLT  --  214 184 178 177   Cardiac Enzymes: No results for input(s): CKTOTAL, CKMB, CKMBINDEX, TROPONINI in the last 168 hours. BNP (last 3 results)  Recent Labs  06/05/15 0407 08/28/15 1216 05/13/16 2343  BNP 683.0* 697.0* 911.7*    ProBNP (last 3 results) No results for input(s): PROBNP in the last 8760 hours.  CBG:  Recent Labs Lab 05/14/16 0317  GLUCAP 107*    Recent Results (from the past 240 hour(s))  MRSA PCR Screening     Status: None   Collection Time: 05/14/16  3:57 AM  Result Value Ref Range Status   MRSA by PCR NEGATIVE NEGATIVE Final    Comment:        The GeneXpert MRSA Assay (FDA approved for NASAL specimens only), is one component of a comprehensive MRSA colonization surveillance program. It is not intended to diagnose MRSA infection nor to guide or monitor treatment for MRSA infections.   Culture, Urine     Status: Abnormal (Preliminary result)   Collection Time: 05/14/16  4:05 AM  Result Value  Ref Range Status   Specimen Description URINE, CLEAN CATCH  Final   Special Requests NONE  Final   Culture 40,000 COLONIES/mL PROTEUS MIRABILIS (A)  Final   Report Status PENDING   Incomplete     Studies: Ct Abdomen Pelvis Wo Contrast  Result Date: 05/14/2016 CLINICAL DATA:  65 y/o F; decreasing hemoglobin with concern for retroperitoneal hemorrhage or hemorrhage at the painful left hip. History of sickle cell disease and cholecystectomy. EXAM: CT ABDOMEN AND PELVIS WITHOUT CONTRAST TECHNIQUE: Multidetector CT imaging of the abdomen and pelvis was performed following the standard protocol without IV contrast. COMPARISON:  05/17/2015 CT abdomen and pelvis. FINDINGS: Lower chest: Severe cardiomegaly. Decreased attenuation of cardiac blood pool compatible with anemia. Hepatobiliary: No focal liver abnormality is seen. Status post cholecystectomy. No biliary dilatation. Pancreas: Unremarkable. No pancreatic ductal dilatation or surrounding inflammatory changes. Spleen: Status post splenectomy. Adrenals/Urinary Tract: Adrenal glands are unremarkable. Stable left kidney interpolar 26 mm cyst. Kidneys are otherwise normal, without renal calculi, focal lesion, or hydronephrosis. Bladder is unremarkable. Stomach/Bowel: Stomach is within normal limits. Appendix appears normal. No evidence of bowel wall thickening, distention, or inflammatory changes. Few scattered diverticulum of the colon. Vascular/Lymphatic: Aortic atherosclerosis with moderate calcifications. No enlarged abdominal or pelvic lymph nodes. Reproductive: Uterus and bilateral adnexa are unremarkable. Other: No abdominal wall hernia or abnormality. No abdominopelvic ascites. Musculoskeletal: Mixed lucent and sclerotic foci in the right femoral head, left proximal femur, bilateral ilium, and small lucencies in vertebral bodies probably representing multiple bone infarcts given history of sickle cell disease. IMPRESSION: 1. No hemorrhage identified. 2. Severe cardiomegaly. 3. Aortic atherosclerosis. 4. Bony stigmata of sickle cell disease with interval right femoral head avascular necrosis. Electronically Signed   By: Kristine Garbe M.D.   On: 05/14/2016 05:18   Dg Chest 1 View  Result Date: 05/13/2016 CLINICAL DATA:  Hypoxia, history sickle cell anemia, COPD EXAM: CHEST 1 VIEW COMPARISON:  Portable exam 1550 hours compared 06/22/2015 FINDINGS: LEFT jugular Port-A-Cath with tip projecting over SVC. Enlargement of cardiac silhouette with pulmonary vascular congestion. Aortic atherosclerosis. Lungs grossly clear. No definite infiltrate, pleural effusion or pneumothorax. No acute osseous findings. IMPRESSION: Enlargement of cardiac silhouette with pulmonary vascular congestion. No acute abnormalities. Electronically Signed   By: Lavonia Dana M.D.   On: 05/13/2016 16:05   Ct Head Wo Contrast  Result Date: 05/14/2016 CLINICAL DATA:  65 year old female with acute encephalopathy. The patient is lethargic, disoriented, and generalized weakness. EXAM: CT HEAD WITHOUT CONTRAST TECHNIQUE: Contiguous axial images were obtained from the base of the skull through the vertex without intravenous contrast. COMPARISON:  None. FINDINGS: Brain: The ventricles and sulci are appropriate in size for patient's age. Mild periventricular and deep white matter chronic microvascular ischemic changes noted. There is no acute intracranial hemorrhage. No mass effect or midline shift noted. Vascular: No hyperdense vessel or unexpected calcification. Skull: There is diffuse ground-glass appearance of the skull with "Salt and pepper" appearance may be related to underlying hyperparathyroidism. Multiple lucent lesions within the skull may represent brown tumor of the bone secondary to hyperparathyroidism. Correlation with clinical exam and parathyroid function tests recommended. Sinuses/Orbits: No acute finding. Other: None IMPRESSION: No acute intracranial pathology. Mild age-related atrophy and chronic microvascular ischemic changes. Ground-glass appearance of the skull suggestive of hyperparathyroidism. Correlation with clinical exam and blood  calcium levels recommended. Electronically Signed   By: Anner Crete M.D.   On: 05/14/2016 05:17   Mr Lumbar Spine Wo Contrast  Result Date: 05/14/2016 CLINICAL  DATA:  Back pain at L4-5.  History of sickle cell anemia. EXAM: MRI LUMBAR SPINE WITHOUT CONTRAST TECHNIQUE: Multiplanar, multisequence MR imaging of the lumbar spine was performed. No intravenous contrast was administered. Patient was unable to complete examination due to pain, axial T1 sequence not obtained. COMPARISON:  CT abdomen and pelvis May 14, 2016 at 0432 hours FINDINGS: Mild motion degraded examination. SEGMENTATION: For the purposes of this report, the last well-formed intervertebral disc will be described as L5-S1. ALIGNMENT: Maintenance of the lumbar lordosis. Minimal grade 1 L4-5 anterolisthesis without spondylolysis. VERTEBRAE:Markedly decreased bone marrow signal on all sequences compatible with bone marrow replacement/reactivation. Vertebral bodies are intact. Mild L4-5 disc height loss, with slight desiccation wall this suspected. Multilevel mild chronic discogenic endplate changes. Focal bright T1 and bright T2 signal in the LEFT ventral L5 vertebral body most compatible with hemangioma. CONUS MEDULLARIS: Conus medullaris terminates at L1-2 and demonstrates normal morphology and signal characteristics. Limited assessment of cauda equina due to mild patient motion and lack of T1 sequence. PARASPINAL AND SOFT TISSUES: Included prevertebral and paraspinal soft tissues are nonsuspicious, moderate symmetric paraspinal muscle atrophy. Renal cysts better seen on today's CT abdomen and pelvis. DISC LEVELS: L1-2 and L2-3: Potential annular fissures without significant disc bulge, canal stenosis or neural foraminal narrowing. L3-4: Small LEFT extraforaminal disc protrusion could affect the exited LEFT L4 nerve. Mild facet arthropathy and ligamentum flavum redundancy. Mild RIGHT, mild to moderate LEFT neural foraminal narrowing. L4-5:  Widened facets to 5 mm with RIGHT greater than LEFT moderate facet arthropathy. Moderate broad-based disc bulge asymmetric to the RIGHT encroaches upon the exited RIGHT L4 nerve. Mild canal stenosis. Moderate to severe RIGHT, mild LEFT neural foraminal narrowing. L5-S1: Small broad-based disc bulge. Mild facet arthropathy and ligamentum flavum redundancy without canal stenosis. Mild RIGHT neural foraminal narrowing. IMPRESSION: Abnormally decreased bone marrow signal attributed to patient's sickle cell anemia though can be seen with other myeloproliferative disorders. Moderate L4-5 facet arthropathy, widened facets associated inflammatory changes and dynamic instability. Minimal grade 1 L4-5 anterolisthesis. This would be better characterized on flexion extension radiographs. Mild canal stenosis L4-5. Neural foraminal narrowing L3-4 through L5-S1: Moderate to severe at L4-5. Electronically Signed   By: Elon Alas M.D.   On: 05/14/2016 22:42   Dg Hip Unilat With Pelvis 2-3 Views Left  Result Date: 05/11/2016 CLINICAL DATA:  Hip pain. EXAM: DG HIP (WITH OR WITHOUT PELVIS) 2-3V LEFT COMPARISON:  None. FINDINGS: There is no evidence of hip fracture or dislocation. There is no evidence of arthropathy or other focal bone abnormality. IMPRESSION: Normal left hip. Electronically Signed   By: Marijo Conception, M.D.   On: 05/11/2016 15:59    Scheduled Meds: . amiodarone  100 mg Oral Daily  . calcitRIOL  0.25 mcg Oral Daily  . cefTRIAXone (ROCEPHIN)  IV  2 g Intravenous Q24H  . fluticasone furoate-vilanterol  1 puff Inhalation Daily  . folic acid  1 mg Oral Daily  . hydroxyurea  500 mg Oral Daily  . pantoprazole  40 mg Oral Daily  . senna-docusate  1 tablet Oral BID  . sodium bicarbonate  650 mg Oral BID   Continuous Infusions:  Principal Problem:   Acute encephalopathy Active Problems:   Left hip pain   Elevated bilirubin   Sickle cell crisis (HCC)   AKI (acute kidney injury) ([place])   CKD  (chronic kidney disease) stage 5, GFR less than 15 ml/min (HCC)   Valvular heart disease   Acute respiratory failure  with hypoxia ([place])    In excess of 50 minutes spent during this visit. 50% involved face to face contact with the patient for assessment, counseling and coordination of care.

## 2016-05-15 NOTE — Progress Notes (Signed)
Attempted to notified Dr. Rodman Key about Hgb 6.1 waiting on return call. I paged admission for Internal Medicine and she said Dr. Rodman Key would be following the patient this morning at this hospital made oncoming RN Rodman Key aware also. Patient stable at this time.

## 2016-05-15 NOTE — Progress Notes (Signed)
Patient arrived to unit by bed.  Reviewed treatment plan and this RN agrees with plan.  Report received from bedside RN, Rodman Key.  Consent obtained.  Patient A & O X 4.   Lung sounds diminished to ausculation in all fields. No edema. Cardiac:  NSR.  Removed caps and cleansed R femoral catheter with chlorhedxidine.  Aspirated ports of heparin and flushed them with saline per protocol.  Connected and secured lines, initiated treatment at 1321.  UF Goal of 57mL and net fluid removal 0L.  Will continue to monitor.

## 2016-05-15 NOTE — Progress Notes (Signed)
Received call from blood bank that patient type and screen came with special needs for blood phenotype and radiated. There will be two units ordered in tonight and thereafter it will be a delay in receiving future units.

## 2016-05-15 NOTE — Progress Notes (Signed)
[facility] Progress Note   Subjective: is more alert and interactive today. Cr up 3.7  Vitals:   05/14/16 2000 05/14/16 2100 05/15/16 0045 05/15/16 0400  BP:  (!) 123/48 (!) 112/43 140/80  Pulse: 75 71 78 81  Resp: _0 Temp:  98.8 F (37.1 C) 98.5 F (36.9 C) 98.8 F (37.1 C)  TempSrc:  Oral Oral Oral  SpO2: 97% 97% 100% 94%  Weight:  86.8 kg (191 lb 5.8 oz)    Height:  _1  (1.651 m)      Inpatient medications: . amiodarone  100 mg Oral Daily  . calcitRIOL  0.25 mcg Oral Daily  . cefTRIAXone (ROCEPHIN)  IV  2 g Intravenous Q24H  . fluticasone furoate-vilanterol  1 puff Inhalation Daily  . folic acid  1 mg Oral Daily  . hydroxyurea  500 mg Oral Daily  . lidocaine (PF)      . pantoprazole  40 mg Oral Daily  . senna-docusate  1 tablet Oral BID  . sodium bicarbonate  650 mg Oral BID    morphine, ondansetron **OR** ondansetron (ZOFRAN) IV, polyethylene glycol  Exam: Gen more alert today No rash, cyanosis or gangrene Sclera anicteric, throat clear slightly dry  Mild JVD Chest clear bilat, no rales or wheezing RRR loud SEM, no RG Abd soft ntnd no mass or ascites +bs  Liver down 6cm MS no joint effusions or deformity Ext no LE or UE edema / no wounds or ulcers Neuro alert today, Ox 2, no asterixis    Assessment: 1. Altered mental status/ asterixis - in pt with CKD stage 4/5 f/b Dr Rolly Salter. eGFR 12-14 ml/ min. Suspect uremia, plan HD today. NH3 up a little at 43.  2. Sickle cell disease 3. Anemia - Hb 6's, defer transfusion decision to Dr Zigmund Daniel in sickle cell pt 4. COPD 5. Hx CHF - no extra vol on exam  Plan - HD today and tomorrow   Kelly Splinter MD [facility] pager [phone]   05/15/2016, 1:11 PM    Recent Labs Lab 05/14/16 0243 05/14/16 1717 05/15/16 0630  NA 140 144 145  K 5.7* 5.4* 5.0  CL 107 112* 110  CO2 _2 GLUCOSE 113* 89 92  BUN 98* 87* 83*  CREATININE 3.87* 3.59* 3.71*  CALCIUM  9.6 9.8 10.2  PHOS  --  6.5*  --     Recent Labs Lab 05/12/16 0235 05/13/16 2343 05/14/16 0234 05/14/16 1717 05/15/16 0630  AST 40 25  --   --  27  ALT 12* 11*  --   --  12*  ALKPHOS 142* 148*  --   --  137*  BILITOT 14.1* 15.4* 19.2*  --  15.9*  PROT 7.0 6.7  --   --  6.1*  ALBUMIN 3.4* 3.1*  --  2.8* 2.4*    Recent Labs Lab 05/13/16 2343 05/14/16 0243 05/15/16 0630  WBC 14.6* 16.1* 13.0*  NEUTROABS 12.0*  --  10.8*  HGB 6.1* 6.3* 6.1*  HCT 17.6* 18.2* 18.5*  MCV 91.2 92.9 92.0  PLT 184 178 177   Iron/TIBC/Ferritin/ %Sat    Component Value Date/Time   IRON 166 04/20/2016 0938   IRON 127 05/30/2014 0920   TIBC 324 04/20/2016 0938   TIBC 306 05/30/2014 0920   FERRITIN 477 (H) 04/20/2016 0938   FERRITIN 175 05/30/2014 0920   IRONPCTSAT 51 (H) 04/20/2016 0938   IRONPCTSAT 42 05/30/2014 0920

## 2016-05-15 NOTE — Procedures (Signed)
Temporary HD cath placed under sterile conditions and using US guidance.  1st attempted R IJ and despite blood return x 3 sticks, could not advance the wire.  Then went to R groin and placed the 20 cm 3-lumen Trialysis catheter in R fem vein w/o complications.   Ready to use.   Kelly Splinter MD [employer] pgr [phone]   05/15/2016, 1:26 PM

## 2016-05-15 NOTE — Procedures (Signed)
  I was present at this dialysis session, have reviewed the session itself and made  appropriate changes Kelly Splinter MD [place] pager [phone]   05/15/2016, 1:26 PM

## 2016-05-16 DIAGNOSIS — K5900 Constipation, unspecified: Secondary | ICD-10-CM

## 2016-05-16 DIAGNOSIS — I2721 Secondary pulmonary arterial hypertension: Secondary | ICD-10-CM

## 2016-05-16 DIAGNOSIS — N390 Urinary tract infection, site not specified: Secondary | ICD-10-CM

## 2016-05-16 DIAGNOSIS — B964 Proteus (mirabilis) (morganii) as the cause of diseases classified elsewhere: Secondary | ICD-10-CM

## 2016-05-16 LAB — RENAL FUNCTION PANEL
ALBUMIN: 2.4 g/dL — AB (ref 3.5–5.0)
Anion gap: 10 (ref 5–15)
BUN: 51 mg/dL — ABNORMAL HIGH (ref 6–20)
CALCIUM: 9.7 mg/dL (ref 8.9–10.3)
CO2: 26 mmol/L (ref 22–32)
CREATININE: 2.87 mg/dL — AB (ref 0.44–1.00)
Chloride: 104 mmol/L (ref 101–111)
GFR, EST AFRICAN AMERICAN: 19 mL/min — AB (ref >60)
GFR, EST NON AFRICAN AMERICAN: 16 mL/min — AB (ref >60)
Glucose, Bld: 99 mg/dL (ref 65–99)
Phosphorus: 5.5 mg/dL — ABNORMAL HIGH (ref 2.5–4.6)
Potassium: 4.1 mmol/L (ref 3.5–5.1)
SODIUM: 140 mmol/L (ref 135–145)

## 2016-05-16 LAB — DIFFERENTIAL
Basophils Absolute: 0 10*3/uL (ref 0.0–0.1)
Basophils Relative: 0 %
EOS PCT: 1 %
Eosinophils Absolute: 0.1 10*3/uL (ref 0.0–0.7)
LYMPHS ABS: 0.7 10*3/uL (ref 0.7–4.0)
Lymphocytes Relative: 6 %
MONO ABS: 2.1 10*3/uL — AB (ref 0.1–1.0)
MONOS PCT: 17 %
NEUTROS ABS: 9.3 10*3/uL — AB (ref 1.7–7.7)
Neutrophils Relative %: 76 %

## 2016-05-16 LAB — CBC
HCT: 18.3 % — ABNORMAL LOW (ref 36.0–46.0)
Hemoglobin: 6.1 g/dL — CL (ref 12.0–15.0)
MCH: 30.8 pg (ref 26.0–34.0)
MCHC: 33.3 g/dL (ref 30.0–36.0)
MCV: 92.4 fL (ref 78.0–100.0)
PLATELETS: 180 10*3/uL (ref 150–400)
RBC: 1.98 MIL/uL — AB (ref 3.87–5.11)
RDW: 27.7 % — ABNORMAL HIGH (ref 11.5–15.5)
WBC: 12.2 10*3/uL — AB (ref 4.0–10.5)

## 2016-05-16 LAB — RETICULOCYTES
RBC.: 1.98 MIL/uL — ABNORMAL LOW (ref 3.87–5.11)
Retic Count, Absolute: 419.8 10*3/uL — ABNORMAL HIGH (ref 19.0–186.0)
Retic Ct Pct: 21.2 % — ABNORMAL HIGH (ref 0.4–3.1)

## 2016-05-16 LAB — URINE CULTURE: Culture: 40000 — AB

## 2016-05-16 NOTE — Progress Notes (Signed)
[facility] Progress Note   Subjective:  Awake and alert this AM AAO to self, location -- but Feb 2018 Feels improved HD yesterday usign Fem Temp HD Cath WBC improved  Vitals:   05/15/16 2320 05/15/16 2325 05/16/16 0425 05/16/16 0500  BP: (!) 114/48 (!) 114/48 (!) 133/50   Pulse: 77 75 77   Resp: 18 15 17    Temp: 99.6 F (37.6 C)     TempSrc: Oral     SpO2: 97% 93% 96%   Weight:    84.6 kg (186 lb 8.2 oz)  Height:        Inpatient medications: . amiodarone  100 mg Oral Daily  . calcitRIOL  0.25 mcg Oral Daily  . cefTRIAXone (ROCEPHIN)  IV  2 g Intravenous Q24H  . fluticasone furoate-vilanterol  1 puff Inhalation Daily  . folic acid  1 mg Oral Daily  . hydroxyurea  500 mg Oral Daily  . morphine  15 mg Oral Once  . pantoprazole  40 mg Oral Daily  . senna-docusate  1 tablet Oral BID  . sodium bicarbonate  650 mg Oral BID    morphine, ondansetron **OR** ondansetron (ZOFRAN) IV, polyethylene glycol  Exam: Gen awake, alert, conversatnt No rash, cyanosis or gangrene Sclera anicteric, throat clear slightly dry  Mild JVD Chest clear bilat, no rales or wheezing RRR loud SEM, no RG Abd soft ntnd no mass or ascites +bs   MS no joint effusions or deformity Ext no LE or UE edema /  Neuro alert today, Ox 2, no asterixis    Assessment: 1. CKD5 with AMS/Asterixus: s/p HD x1 12/15 for suspected uremia.  No asterixus today 1. Follows with [facility] 2. At near baseline SCr  2. Sickle cell disease 3. Anemia - Hb 6's, defer transfusion decision to Dr Zigmund Daniel in sickle cell pt 4. COPD 5. Hx CHF - no extra vol on exam 6. Leukocytosis: U Cx with low level bacteruria and B Cx NGTD; On ceftriaxone  Plan -   Remove temp Fem HD cath  Watch over the weekend  Either will be HD dependent and will obtain [facility] or return to baseline   05/16/2016, 8:00 AM    Recent Labs Lab 05/14/16 0243 05/14/16 1717 05/15/16 0630  NA 140 144 145  K 5.7* 5.4* 5.0   CL 107 112* 110  CO2 22 22 24   GLUCOSE 113* 89 92  BUN 98* 87* 83*  CREATININE 3.87* 3.59* 3.71*  CALCIUM 9.6 9.8 10.2  PHOS  --  6.5*  --     Recent Labs Lab 05/12/16 0235 05/13/16 2343 05/14/16 0234 05/14/16 1717 05/15/16 0630  AST 40 25  --   --  27  ALT 12* 11*  --   --  12*  ALKPHOS 142* 148*  --   --  137*  BILITOT 14.1* 15.4* 19.2*  --  15.9*  PROT 7.0 6.7  --   --  6.1*  ALBUMIN 3.4* 3.1*  --  2.8* 2.4*    Recent Labs Lab 05/13/16 2343 05/14/16 0243 05/15/16 0630  WBC 14.6* 16.1* 13.0*  NEUTROABS 12.0*  --  10.8*  HGB 6.1* 6.3* 6.1*  HCT 17.6* 18.2* 18.5*  MCV 91.2 92.9 92.0  PLT 184 178 177   Iron/TIBC/Ferritin/ %Sat    Component Value Date/Time   IRON 166 04/20/2016 0938   IRON 127 05/30/2014 0920   TIBC 324 04/20/2016 0938   TIBC 306 05/30/2014 0920   FERRITIN 477 (H) 04/20/2016  [place] 05/30/2014 0920   IRONPCTSAT 51 (H) 04/20/2016 0938   IRONPCTSAT 42 05/30/2014 0920

## 2016-05-16 NOTE — Progress Notes (Signed)
Patient transferred from 3S to 6e10 via bed. Telemetry #10 applied and 2 verifications with central telemetry. Skin assessed by 2 RNs. Noted chronic lesions to bilateral shins and left ankle. Patient states no treatment needed. Pain rated 8/10. MSIR administered per MD order. Nutrition contacted for meal tray with quick arrival. VSS. Will continue to monitor. Bartholomew Crews, RN

## 2016-05-16 NOTE — Progress Notes (Signed)
SICKLE CELL SERVICE PROGRESS NOTE  Jane Short [id number] DOB: Jul 07, 1950 DOA: 05/13/2016 PCP: Tommi Rumps, MD  Assessment/Plan: Principal Problem:   Acute encephalopathy Active Problems:   Left hip pain   Elevated bilirubin   Sickle cell crisis (HCC)   AKI (acute kidney injury) ([place])   CKD (chronic kidney disease) stage 5, GFR less than 15 ml/min (HCC)   Valvular heart disease   Acute respiratory failure with hypoxia ([place])  1. Acute Back Pain:  MRI findings noted. She reports no pain even with movement today.   She will need to be evaluated by Neurosurgery or Orthopedic surgery. She has not received any opiates since 1205 2 days ago.  2. Anemia of Chronic Disease: Pt has anemia associated with  Baseline Hb 5.6. Labs pending from today. Will hold on transfusion. Aranesp given yesterday in place of Epogen.  3. Hypoxia: Saturations 97-100% on RA today.    4. Acute on CKD IV:  Her baseline Cr is 3.8 and baseline BUN 56.Labs pending from today. 2-D ECHO results noted. Continue Rocaltrol and Sodium Bicarbonate.  5. Chronic Hemolysis: Pt would benefit from a therapeutic dose of Hydrea to induce a more stable population of Hb however per her records she has a h/o Lichen Planus associate with Hydrea use. There is no data to support that subtheapeutic  6. Hb SS without crisis: Pt is not in crisis.She is opiate naive and has been using Aleve for her hip pains. She however has CKD IV and NSAID's are contraindicated. Continue Folic Acid.  7. Hyperkalemia: :Labs pending from today.  8. Elevated BNP: Un interpretable in a setting of CKD and Cardiac Valvular disease. 9. UTI: Improved: U/A findings consistent with UTI . On day #3 of Rocephin. Urine culture shows 40,000 colonies of Proteus Mirabilis which is sensitive to Rocephin. Will continue treatment althoug colony count low in light of her being symptomatic and given her renal function. .  10. Leukocytosis: Labs pending from today.   11. Gait abnormality and weakness: Will consult PT and OT. 12. Constipation: Will give Lactulose today. Continue Senna-S.  13. Valvular Heart Disease with Mild decrease in Systolic Function: Continue Pacerone and Lasix held as patient euvolemic.  2-ECHO shows stable findings since last ECHO.  14. 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  16. Lichen Planus:Please note that nursing documented stage II pressure ulcer on RLE. However this is not a pressure ulcer but rather healed Lichen Planus lesions 17. Chronic pain: Pt usually takes Aleve for chronic pain in her hips despite CKD and being advised against taking this.    Code Status: Full Code Family Communication: Husband at bedside and clinical course and plans discussed with both patient and husband. Disposition Plan: Transfer to med-surg floor today.   Jane Short A.  Pager [phone]. If 7PM-7AM, please contact night-coverage.  05/16/2016, 10:38 AM  LOS: 3 days   Interim History: This is an opiate naive patient with Hb ss, CKD IV, Aortic and A-V Valvular disease, CHF, Idiopathic Pulmonary HTN and OSA who was admitted to [facility] with hemolytic anemia and sickle cell crisis. Per her husband and confirmed by patient, her pain began when she had a car ride to their home in [place]. She was seen in the ED as the pain was excruciating and she was unable to tolerate it. She received "a shot" and was discharged with a prescription for Morphine 15 mg. She states that she took about 4 doses but had  about 3 episodes of emesis which she attributes to the Morphine. However their is not a strong temporal relationship between Morphine and emesis. She did have significant itching with the Morphine. She was seen by her Hematologist in the office and found to have a Hb of 5.0 g/dL so was sent to the hospital for further evaluation.  At [facility], she was transfused 2 units RBC's for a 5.0 g/dL which  improved to 7.6 g/dL after transfusion. Today her Hb is 6.1 g/dL. Per her records, her baseline Hb is  She normally takes . However her LDH is down to 646 from 901.  She also has chronically elevated bilirubin likely due to chronic hemolysis. She reports that her dry weight is 167 #'s and she takes Lasix 80 mg daily expect when her weight is increased she takes it BID. He last lasix was 6 days ago and her weight today is 192 #'s. However a review of office visits in the last year show weights of 194 3's.  Her clinical course was consistennt with Uremia and she was seen by Nephrology and transferred to [facility] for dialysis. Pt is much more alert and well appearing today. He husband reports almost back to baseline. She has not had a BM since admission.   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 I have also reviewed and a TEE done by Dr. 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 However per her husband, it was felt that surgery would be a greater risk to the patient given her overall medical condition. Review of her Hb electrophoresis from [facility] is consistent with Hb SS ini a transfused patient. She also has a h/o idiopathic pul;monary HTN and she a smoking history but is not a current smoker. However has a Dx of COPD and is on Ellipta.  Review of labs shows previous BNP of 911. BNP to be 680 and as high as 901 in the past.   Consultants:  None  Procedures:  None  Antibiotics:  Rocephin 12/14 >>   Objective: Vitals:   05/16/16 0425 05/16/16 0500 05/16/16 0833 05/16/16 0842  BP: (!) 133/50  (!) 123/50 (!) 123/50  Pulse: 77  76 76  Resp: 17  15 17   Temp:   98.7 F (37.1 C)   TempSrc:   Oral   SpO2: 96%  92% 94%  Weight:  84.6 kg (186 lb 8.2 oz)    Height:       Weight change: -2.4 kg (-5 lb 4.7 oz)  Intake/Output Summary (Last 24 hours) at 05/16/16 1038 Last  data filed at 05/16/16 1000  Gross per 24 hour  Intake              720 ml  Output                0 ml  Net              720 ml    General: Alert, awake, oriented x3, in no apparent distress today. Well appearing.  HEENT: [place]ice/AT PEERL, EOMI, icterus present but less than yesterday. Pt also has muddy sclera. Neck: Trachea midline,  no masses, no thyromegal,y no JVD, no carotid bruit OROPHARYNX:  Moist, No exudate/ erythema/lesions.  Heart: Regular rate and rhythm, without rubs or gallops. She has SEM II/VI at 2nd intercostal space.  PMI non-displaced, no heaves or thrills on  palpation.  Lungs: Clear to auscultation but decreased breath sound at bases. No wheezing or rhonchi noted. No increased vocal fremitus resonant to percussion  Abdomen: Soft, nontender, nondistended, positive bowel sounds, no masses no hepatosplenomegaly noted.  Neuro: No focal neurological deficits noted cranial nerves II through XII grossly intact.  Strength at baseline  in bilateral upper. Pt able to move BLE's  Though partial range against gravity. Musculoskeletal: No warmth swelling or erythema around joints, no spinal tenderness noted, but patient does have tenderness noted adjacent to L4-L5 level.  Psychiatric: Patient alert and oriented x3, good insight and cognition, good recent to remote recall.    Data Reviewed: Basic Metabolic Panel:  Recent Labs Lab 05/13/16 0118 05/13/16 2343 05/14/16 0243 05/14/16 1717 05/15/16 0630  NA 140 142 140 144 145  K 4.6 5.5* 5.7* 5.4* 5.0  CL 116* 110 107 112* 110  CO2 17* 24 22 22 24   GLUCOSE 82 105* 113* 89 92  BUN 70* 100* 98* 87* 83*  CREATININE 3.24* 4.01* 3.87* 3.59* 3.71*  CALCIUM 7.0* 9.7 9.6 9.8 10.2  PHOS  --   --   --  6.5*  --    Liver Function Tests:  Recent Labs Lab 05/11/16 1523 05/12/16 0235 05/13/16 2343 05/14/16 0234 05/14/16 1717 05/15/16 0630  AST 47* 40 25  --   --  27  ALT 15 12* 11*  --   --  12*  ALKPHOS 150* 142* 148*  --   --   137*  BILITOT 13.0* 14.1* 15.4* 19.2*  --  15.9*  PROT 7.2 7.0 6.7  --   --  6.1*  ALBUMIN 3.4* 3.4* 3.1*  --  2.8* 2.4*   No results for input(s): LIPASE, AMYLASE in the last 168 hours.  Recent Labs Lab 05/14/16 1740  AMMONIA 46*   CBC:  Recent Labs Lab 05/11/16 1010 05/12/16 0235 05/13/16 2343 05/14/16 0243 05/15/16 0630  WBC  --  12.5* 14.6* 16.1* 13.0*  NEUTROABS  --   --  12.0*  --  10.8*  HGB 5.0* 7.6* 6.1* 6.3* 6.1*  HCT 14.3* 21.5* 17.6* 18.2* 18.5*  MCV  --  93.5 91.2 92.9 92.0  PLT  --  214 184 178 177   Cardiac Enzymes: No results for input(s): CKTOTAL, CKMB, CKMBINDEX, TROPONINI in the last 168 hours. BNP (last 3 results)  Recent Labs  06/05/15 0407 08/28/15 1216 05/13/16 2343  BNP 683.0* 697.0* 911.7*    ProBNP (last 3 results) No results for input(s): PROBNP in the last 8760 hours.  CBG:  Recent Labs Lab 05/14/16 0317  GLUCAP 107*    Recent Results (from the past 240 hour(s))  Culture, blood (Routine X 2) w Reflex to ID Panel     Status: None (Preliminary result)   Collection Time: 05/14/16  2:35 AM  Result Value Ref Range Status   Specimen Description BLOOD RIGHT ARM  Final   Special Requests IN PEDIATRIC BOTTLE 4CC  Final   Culture   Final    NO GROWTH 1 DAY Performed at [facility]    Report Status PENDING  Incomplete  Culture, blood (Routine X 2) w Reflex to ID Panel     Status: None (Preliminary result)   Collection Time: 05/14/16  2:40 AM  Result Value Ref Range Status   Specimen Description BLOOD RIGHT HAND  Final   Special Requests BOTTLES DRAWN AEROBIC ONLY 5CC  Final   Culture   Final    NO GROWTH  1 DAY Performed at [facility]    Report Status PENDING  Incomplete  MRSA PCR Screening     Status: None   Collection Time: 05/14/16  3:57 AM  Result Value Ref Range Status   MRSA by PCR NEGATIVE NEGATIVE Final    Comment:        The GeneXpert MRSA Assay (FDA approved for NASAL specimens only), is one  component of a comprehensive MRSA colonization surveillance program. It is not intended to diagnose MRSA infection nor to guide or monitor treatment for MRSA infections.   Culture, Urine     Status: Abnormal   Collection Time: 05/14/16  4:05 AM  Result Value Ref Range Status   Specimen Description URINE, CLEAN CATCH  Final   Special Requests NONE  Final   Culture 40,000 COLONIES/mL PROTEUS MIRABILIS (A)  Final   Report Status 05/16/2016 FINAL  Final   Organism ID, Bacteria PROTEUS MIRABILIS (A)  Final      Susceptibility   Proteus mirabilis - MIC*    AMPICILLIN <=2 SENSITIVE Sensitive     CEFAZOLIN <=4 SENSITIVE Sensitive     CEFTRIAXONE <=1 SENSITIVE Sensitive     CIPROFLOXACIN >=4 RESISTANT Resistant     GENTAMICIN <=1 SENSITIVE Sensitive     IMIPENEM 1 SENSITIVE Sensitive     NITROFURANTOIN 128 RESISTANT Resistant     TRIMETH/SULFA >=320 RESISTANT Resistant     AMPICILLIN/SULBACTAM <=2 SENSITIVE Sensitive     PIP/TAZO <=4 SENSITIVE Sensitive     * 40,000 COLONIES/mL PROTEUS MIRABILIS     Studies: Ct Abdomen Pelvis Wo Contrast  Result Date: 05/14/2016 CLINICAL DATA:  65 y/o F; decreasing hemoglobin with concern for retroperitoneal hemorrhage or hemorrhage at the painful left hip. History of sickle cell disease and cholecystectomy. EXAM: CT ABDOMEN AND PELVIS WITHOUT CONTRAST TECHNIQUE: Multidetector CT imaging of the abdomen and pelvis was performed following the standard protocol without IV contrast. COMPARISON:  05/17/2015 CT abdomen and pelvis. FINDINGS: Lower chest: Severe cardiomegaly. Decreased attenuation of cardiac blood pool compatible with anemia. Hepatobiliary: No focal liver abnormality is seen. Status post cholecystectomy. No biliary dilatation. Pancreas: Unremarkable. No pancreatic ductal dilatation or surrounding inflammatory changes. Spleen: Status post splenectomy. Adrenals/Urinary Tract: Adrenal glands are unremarkable. Stable left kidney interpolar 26 mm cyst.  Kidneys are otherwise normal, without renal calculi, focal lesion, or hydronephrosis. Bladder is unremarkable. Stomach/Bowel: Stomach is within normal limits. Appendix appears normal. No evidence of bowel wall thickening, distention, or inflammatory changes. Few scattered diverticulum of the colon. Vascular/Lymphatic: Aortic atherosclerosis with moderate calcifications. No enlarged abdominal or pelvic lymph nodes. Reproductive: Uterus and bilateral adnexa are unremarkable. Other: No abdominal wall hernia or abnormality. No abdominopelvic ascites. Musculoskeletal: Mixed lucent and sclerotic foci in the right femoral head, left proximal femur, bilateral ilium, and small lucencies in vertebral bodies probably representing multiple bone infarcts given history of sickle cell disease. IMPRESSION: 1. No hemorrhage identified. 2. Severe cardiomegaly. 3. Aortic atherosclerosis. 4. Bony stigmata of sickle cell disease with interval right femoral head avascular necrosis. Electronically Signed   By: Kristine Garbe M.D.   On: 05/14/2016 05:18   Dg Chest 1 View  Result Date: 05/13/2016 CLINICAL DATA:  Hypoxia, history sickle cell anemia, COPD EXAM: CHEST 1 VIEW COMPARISON:  Portable exam 1550 hours compared 06/22/2015 FINDINGS: LEFT jugular Port-A-Cath with tip projecting over SVC. Enlargement of cardiac silhouette with pulmonary vascular congestion. Aortic atherosclerosis. Lungs grossly clear. No definite infiltrate, pleural effusion or pneumothorax. No acute osseous findings. IMPRESSION: Enlargement of  cardiac silhouette with pulmonary vascular congestion. No acute abnormalities. Electronically Signed   By: Lavonia Dana M.D.   On: 05/13/2016 16:05   Ct Head Wo Contrast  Result Date: 05/14/2016 CLINICAL DATA:  65 year old female with acute encephalopathy. The patient is lethargic, disoriented, and generalized weakness. EXAM: CT HEAD WITHOUT CONTRAST TECHNIQUE: Contiguous axial images were obtained from the  base of the skull through the vertex without intravenous contrast. COMPARISON:  None. FINDINGS: Brain: The ventricles and sulci are appropriate in size for patient's age. Mild periventricular and deep white matter chronic microvascular ischemic changes noted. There is no acute intracranial hemorrhage. No mass effect or midline shift noted. Vascular: No hyperdense vessel or unexpected calcification. Skull: There is diffuse ground-glass appearance of the skull with "Salt and pepper" appearance may be related to underlying hyperparathyroidism. Multiple lucent lesions within the skull may represent brown tumor of the bone secondary to hyperparathyroidism. Correlation with clinical exam and parathyroid function tests recommended. Sinuses/Orbits: No acute finding. Other: None IMPRESSION: No acute intracranial pathology. Mild age-related atrophy and chronic microvascular ischemic changes. Ground-glass appearance of the skull suggestive of hyperparathyroidism. Correlation with clinical exam and blood calcium levels recommended. Electronically Signed   By: Anner Crete M.D.   On: 05/14/2016 05:17   Mr Lumbar Spine Wo Contrast  Result Date: 05/14/2016 CLINICAL DATA:  Back pain at L4-5.  History of sickle cell anemia. EXAM: MRI LUMBAR SPINE WITHOUT CONTRAST TECHNIQUE: Multiplanar, multisequence MR imaging of the lumbar spine was performed. No intravenous contrast was administered. Patient was unable to complete examination due to pain, axial T1 sequence not obtained. COMPARISON:  CT abdomen and pelvis May 14, 2016 at 0432 hours FINDINGS: Mild motion degraded examination. SEGMENTATION: For the purposes of this report, the last well-formed intervertebral disc will be described as L5-S1. ALIGNMENT: Maintenance of the lumbar lordosis. Minimal grade 1 L4-5 anterolisthesis without spondylolysis. VERTEBRAE:Markedly decreased bone marrow signal on all sequences compatible with bone marrow replacement/reactivation.  Vertebral bodies are intact. Mild L4-5 disc height loss, with slight desiccation wall this suspected. Multilevel mild chronic discogenic endplate changes. Focal bright T1 and bright T2 signal in the LEFT ventral L5 vertebral body most compatible with hemangioma. CONUS MEDULLARIS: Conus medullaris terminates at L1-2 and demonstrates normal morphology and signal characteristics. Limited assessment of cauda equina due to mild patient motion and lack of T1 sequence. PARASPINAL AND SOFT TISSUES: Included prevertebral and paraspinal soft tissues are nonsuspicious, moderate symmetric paraspinal muscle atrophy. Renal cysts better seen on today's CT abdomen and pelvis. DISC LEVELS: L1-2 and L2-3: Potential annular fissures without significant disc bulge, canal stenosis or neural foraminal narrowing. L3-4: Small LEFT extraforaminal disc protrusion could affect the exited LEFT L4 nerve. Mild facet arthropathy and ligamentum flavum redundancy. Mild RIGHT, mild to moderate LEFT neural foraminal narrowing. L4-5: Widened facets to 5 mm with RIGHT greater than LEFT moderate facet arthropathy. Moderate broad-based disc bulge asymmetric to the RIGHT encroaches upon the exited RIGHT L4 nerve. Mild canal stenosis. Moderate to severe RIGHT, mild LEFT neural foraminal narrowing. L5-S1: Small broad-based disc bulge. Mild facet arthropathy and ligamentum flavum redundancy without canal stenosis. Mild RIGHT neural foraminal narrowing. IMPRESSION: Abnormally decreased bone marrow signal attributed to patient's sickle cell anemia though can be seen with other myeloproliferative disorders. Moderate L4-5 facet arthropathy, widened facets associated inflammatory changes and dynamic instability. Minimal grade 1 L4-5 anterolisthesis. This would be better characterized on flexion extension radiographs. Mild canal stenosis L4-5. Neural foraminal narrowing L3-4 through L5-S1: Moderate to severe at L4-5.  Electronically Signed   By: Elon Alas  M.D.   On: 05/14/2016 22:42   Dg Hip Unilat With Pelvis 2-3 Views Left  Result Date: 05/11/2016 CLINICAL DATA:  Hip pain. EXAM: DG HIP (WITH OR WITHOUT PELVIS) 2-3V LEFT COMPARISON:  None. FINDINGS: There is no evidence of hip fracture or dislocation. There is no evidence of arthropathy or other focal bone abnormality. IMPRESSION: Normal left hip. Electronically Signed   By: Marijo Conception, M.D.   On: 05/11/2016 15:59    Scheduled Meds: . amiodarone  100 mg Oral Daily  . calcitRIOL  0.25 mcg Oral Daily  . cefTRIAXone (ROCEPHIN)  IV  2 g Intravenous Q24H  . fluticasone furoate-vilanterol  1 puff Inhalation Daily  . folic acid  1 mg Oral Daily  . hydroxyurea  500 mg Oral Daily  . morphine  15 mg Oral Once  . pantoprazole  40 mg Oral Daily  . senna-docusate  1 tablet Oral BID  . sodium bicarbonate  650 mg Oral BID   Continuous Infusions:  Principal Problem:   Acute encephalopathy Active Problems:   Left hip pain   Elevated bilirubin   Sickle cell crisis (HCC)   AKI (acute kidney injury) ([place])   CKD (chronic kidney disease) stage 5, GFR less than 15 ml/min (HCC)   Valvular heart disease   Acute respiratory failure with hypoxia (HCC)    In excess of 35 minutes spent during this visit. 50% involved face to face contact with the patient for assessment, counseling and coordination of care.

## 2016-05-17 DIAGNOSIS — M25562 Pain in left knee: Secondary | ICD-10-CM

## 2016-05-17 LAB — RETICULOCYTES
RBC.: 1.86 MIL/uL — ABNORMAL LOW (ref 3.87–5.11)
RETIC COUNT ABSOLUTE: 394.3 10*3/uL — AB (ref 19.0–186.0)
RETIC CT PCT: 21.2 % — AB (ref 0.4–3.1)

## 2016-05-17 LAB — RENAL FUNCTION PANEL
ANION GAP: 12 (ref 5–15)
Albumin: 2.3 g/dL — ABNORMAL LOW (ref 3.5–5.0)
BUN: 63 mg/dL — ABNORMAL HIGH (ref 6–20)
CALCIUM: 10 mg/dL (ref 8.9–10.3)
CO2: 25 mmol/L (ref 22–32)
CREATININE: 3.39 mg/dL — AB (ref 0.44–1.00)
Chloride: 102 mmol/L (ref 101–111)
GFR, EST AFRICAN AMERICAN: 15 mL/min — AB (ref >60)
GFR, EST NON AFRICAN AMERICAN: 13 mL/min — AB (ref >60)
Glucose, Bld: 102 mg/dL — ABNORMAL HIGH (ref 65–99)
Phosphorus: 6.4 mg/dL — ABNORMAL HIGH (ref 2.5–4.6)
Potassium: 4.5 mmol/L (ref 3.5–5.1)
SODIUM: 139 mmol/L (ref 135–145)

## 2016-05-17 LAB — DIFFERENTIAL
BASOS PCT: 1 %
Basophils Absolute: 0.1 10*3/uL (ref 0.0–0.1)
EOS PCT: 1 %
Eosinophils Absolute: 0.1 10*3/uL (ref 0.0–0.7)
LYMPHS ABS: 2.6 10*3/uL (ref 0.7–4.0)
Lymphocytes Relative: 20 %
Monocytes Absolute: 2.5 10*3/uL — ABNORMAL HIGH (ref 0.1–1.0)
Monocytes Relative: 19 %
Neutro Abs: 7.6 10*3/uL (ref 1.7–7.7)
Neutrophils Relative %: 59 %

## 2016-05-17 LAB — CBC
HCT: 16.8 % — ABNORMAL LOW (ref 36.0–46.0)
Hemoglobin: 5.5 g/dL — CL (ref 12.0–15.0)
MCH: 29.6 pg (ref 26.0–34.0)
MCHC: 32.7 g/dL (ref 30.0–36.0)
MCV: 90.3 fL (ref 78.0–100.0)
PLATELETS: 201 10*3/uL (ref 150–400)
RBC: 1.86 MIL/uL — AB (ref 3.87–5.11)
RDW: 26.7 % — ABNORMAL HIGH (ref 11.5–15.5)
WBC: 12.9 10*3/uL — ABNORMAL HIGH (ref 4.0–10.5)

## 2016-05-17 LAB — PREPARE RBC (CROSSMATCH)

## 2016-05-17 LAB — GLUCOSE, CAPILLARY: GLUCOSE-CAPILLARY: 106 mg/dL — AB (ref 65–99)

## 2016-05-17 MED ORDER — SODIUM CHLORIDE 0.9 % IV SOLN: Freq: Once | INTRAVENOUS | Status: DC

## 2016-05-17 MED ORDER — SODIUM CHLORIDE 0.9% FLUSH
10.0000 mL | Freq: Two times a day (BID) | INTRAVENOUS | Status: DC
  Administered 2016-05-19 (×2): 10 mL

## 2016-05-17 MED ORDER — NEPRO/CARBSTEADY PO LIQD
237.0000 mL | Freq: Two times a day (BID) | ORAL | Status: DC
  Administered 2016-05-18 – 2016-05-21 (×4): 237 mL via ORAL

## 2016-05-17 MED ORDER — SODIUM CHLORIDE 0.9% FLUSH: 10.0000 mL | INTRAVENOUS | Status: DC | PRN

## 2016-05-17 NOTE — Evaluation (Signed)
Occupational Therapy Evaluation Patient Details Name: Jane Short MRN: [id number] DOB: 1950/08/23 Today's Date: 05/17/2016    History of Present Illness Pt admitted for complaints of hip pain on L LE., SOB, amd anemia.  Pt PMH includes sickle cell dx, congestive hepatopathy, CHF, CKD5, COPD, HTN, and anemia. Pt also has home O2, 2L as needed.   Clinical Impression   Pt. Is weak and is Max A to stand for less than 30 seconds. Pt. Is unable to transfer currently. Pt. Has decreased I with performing LE ADLs and is requiring 2 person assist. Pt. And husband are agreeable to [facility] for [facility]hab.     Follow Up Recommendations  SNF    Equipment Recommendations  Other (comment) (To be determined at d/c location.)    Recommendations for Other Services       Precautions / Restrictions Precautions Precautions: Fall Restrictions Weight Bearing Restrictions: No      Mobility Bed Mobility Overal bed mobility: Needs Assistance Bed Mobility: Supine to Sit;Sit to Supine     Supine to sit: Mod assist Sit to supine: Mod assist   General bed mobility comments: Assist to move LE  Transfers       Sit to Stand: Max assist         General transfer comment: Unable to transfer at this time.     Balance                                            ADL Overall ADL's : Needs assistance/impaired Eating/Feeding: Independent   Grooming: Wash/dry hands;Wash/dry face;Oral care;Set up;Sitting   Upper Body Bathing: Supervision/ safety;Set up;Sitting   Lower Body Bathing: Maximal assistance;+2 for physical assistance;+2 for safety/equipment;Sit to/from stand   Upper Body Dressing : Minimal assistance;Sitting   Lower Body Dressing: Total assistance;+2 for physical assistance;+2 for safety/equipment;Sit to/from stand               Functional mobility during ADLs:  (Pt. could stand less than 30 seconds with max A) General ADL Comments: Pt. is requiring  extensive assist with LE ADLs at this time.     Vision     Perception     Praxis      Pertinent Vitals/Pain Pain Assessment: No/denies pain     Hand Dominance     Extremity/Trunk Assessment Upper Extremity Assessment Upper Extremity Assessment: Overall WFL for tasks assessed           Communication Communication Communication: No difficulties   Cognition Arousal/Alertness: Awake/alert Behavior During Therapy: WFL for tasks assessed/performed Overall Cognitive Status: Within Functional Limits for tasks assessed                     General Comments       Exercises       Shoulder Instructions      Home Living Family/patient expects to be discharged to:: Private residence Living Arrangements: Spouse/significant other Available Help at Discharge: Family Type of Home: House Home Access: Stairs to enter Technical brewer of Steps: 5 Entrance Stairs-Rails: Can reach both Home Layout: One level     Bathroom Shower/Tub: Tub/shower unit Shower/tub characteristics: Door Biochemist, clinical: Standard     Home Equipment: Environmental consultant - 2 wheels;Cane - single point;Wheelchair - manual (Pt. has a BSC in home in [place], not in their home here. )  Prior Functioning/Environment Level of Independence: Independent with assistive device(s)        Comments: reports she ambulates using SPC in home and uses WC for long distances        OT Problem List: Decreased activity tolerance;Decreased knowledge of use of DME or AE;Obesity   OT Treatment/Interventions: Self-care/ADL training;DME and/or AE instruction;Therapeutic activities;Patient/family education    OT Goals(Current goals can be found in the care plan section) Acute Rehab OT Goals Patient Stated Goal:  (get stronger) OT Goal Formulation: With patient Time For Goal Achievement: 05/31/16 Potential to Achieve Goals: Good ADL Goals Pt Will Perform Grooming: with modified independence;standing Pt  Will Perform Lower Body Bathing: with modified independence;sit to/from stand Pt Will Perform Lower Body Dressing: with modified independence;sit to/from stand Pt Will Transfer to Toilet: with modified independence;bedside commode Pt Will Perform Toileting - Clothing Manipulation and hygiene: with modified independence;sit to/from stand  OT Frequency: Min 3X/week   Barriers to D/C: Decreased caregiver support   (Pt. husband can assist but not at this level requried. )       Co-evaluation              End of Session    Activity Tolerance: Patient limited by fatigue Patient left: in bed;with call bell/phone within reach;with family/visitor present   Time: 1120-1216 OT Time Calculation (min): 56 min Charges:  OT General Charges $OT Visit: 1 Procedure OT Evaluation $OT Eval Moderate Complexity: 1 Procedure OT Treatments $Self Care/Home Management : 23-37 mins G-Codes:    Mellody Masri 2016-05-29, 12:17 PM

## 2016-05-17 NOTE — Progress Notes (Signed)
SICKLE CELL SERVICE PROGRESS NOTE  Jane Short [id number] DOB: July 29, 1950 DOA: 05/13/2016 PCP: Tommi Rumps, MD  Assessment/Plan: Principal Problem:   Acute encephalopathy Active Problems:   Left hip pain   Elevated bilirubin   Sickle cell crisis (HCC)   AKI (acute kidney injury) ([place])   CKD (chronic kidney disease) stage 5, GFR less than 15 ml/min (HCC)   Valvular heart disease   Acute respiratory failure with hypoxia ([place])  1. Acute Pain:  MRI findings noted. She reports no pain in the back  even with movement today. She will need to be evaluated by Neurosurgery or Orthopedic surgery. She has not required any further pain medications. She does have pain in the Left Knee so will obtain an X-ray. 2. Gait Abnormality: Pt was evaluated by PT today and recommendations were made for SNF for rehab. Will consult SW for placement.  3. Anemia of Chronic Disease: Pt has a chronic anemia and Hb from today 5.5 g/dL which is her baseline. Furthermore pt asymptomatic. Will hold on transfusion. Aranesp given yesterday in place of Epogen.  4. Hypoxia: Saturations 97-100% on RA today.    5. Acute on CKD IV:  Her baseline Cr is 3.8 and baseline BUN 56.Labs pending from today. 2-D ECHO results noted. Continue Rocaltrol and Sodium Bicarbonate.  6. Chronic Hemolysis: Pt would benefit from a therapeutic dose of Hydrea to induce a more stable population of Hb however per her records she has a h/o Lichen Planus associate with Hydrea use. There is no data to support that sub-theapeutic dose. Will D/C Hydrea in light of low Hb. 7. Hb SS without crisis: Pt is not in crisis.She is opiate naive and has been using Aleve for her hip pains. She however has CKD IV and NSAID's are contraindicated. Continue Folic Acid.  8. Hyperkalemia: :Labs pending from today.  9. Elevated BNP: Uninterpretable in a setting of CKD and Cardiac Valvular disease. 10. UTI: Improved: U/A findings consistent with UTI . On day #3 of  Rocephin. Urine culture shows 40,000 colonies of Proteus Mirabilis which is sensitive to Rocephin. Pt has completed 3 days of therapy. Will discontinue antibiotics today. 11. Leukocytosis: Labs pending from today.  12. Gait abnormality and weakness:  PT and OT input appreciated. 13. Constipation: Will give Lactulose today. Continue Senna-S.  14. Valvular Heart Disease with Mild decrease in Systolic Function: Continue Pacerone and Lasix held as patient euvolemic.  2-ECHO shows stable findings since last ECHO.  15. 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  17. Lichen Planus:Please note that nursing documented stage II pressure ulcer on RLE. However this is not a pressure ulcer but rather healed Lichen Planus lesions 18. Chronic pain: Pt usually takes Aleve for chronic pain in her hips despite CKD and being advised against taking this.    Code Status: Full Code Family Communication: Husband at bedside and clinical course and plans discussed with both patient and husband. Disposition Plan: Not yet ready for discharge.   Tenzin Edelman A.  Pager [phone]. If 7PM-7AM, please contact night-coverage.  05/17/2016, 6:48 PM  LOS: 4 days   Interim History: This is an opiate naive patient with Hb ss, CKD IV, Aortic and A-V Valvular disease, CHF, Idiopathic Pulmonary HTN and OSA who was admitted to [facility] with hemolytic anemia and sickle cell crisis. Per her husband and confirmed by patient, her pain began when she had a car ride to their home in [place]. She was seen in the  ED as the pain was excruciating and she was unable to tolerate it. She received "a shot" and was discharged with a prescription for Morphine 15 mg. She states that she took about 4 doses but had about 3 episodes of emesis which she attributes to the Morphine. However their is not a strong temporal relationship between Morphine and emesis. She did have significant itching with  the Morphine. She was seen by her Hematologist in the office and found to have a Hb of 5.0 g/dL so was sent to the hospital for further evaluation.  At [facility], she was transfused 2 units RBC's for a 5.0 g/dL which improved to 7.6 g/dL after transfusion. Today her Hb is 6.1 g/dL. Per her records, her baseline Hb is  She normally takes . However her LDH is down to 646 from 901.  She also has chronically elevated bilirubin likely due to chronic hemolysis. She reports that her dry weight is 167 #'s and she takes Lasix 80 mg daily expect when her weight is increased she takes it BID. He last lasix was 6 days ago and her weight today is 192 #'s. However a review of office visits in the last year show weights of 194 3's.  Her clinical course was consistennt with Uremia and she was seen by Nephrology and transferred to [facility] for dialysis. Pt is much more alert and well appearing today. He husband reports almost back to baseline. She  had a BM 2 days ago.   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 I have also reviewed and a TEE done by Dr. 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 However per her husband, it was felt that surgery would be a greater risk to the patient given her overall medical condition. Review of her Hb electrophoresis from [facility] is consistent with Hb SS in a transfused patient. She also has a h/o idiopathic pul;monary HTN and she a smoking history but is not a current smoker. However has a Dx of COPD and is on Ellipta.  Review of labs shows previous BNP of 911. BNP to be 680 and as high as 901 in the past.   Consultants:  None  Procedures:  None  Antibiotics:  Rocephin 12/14 >>12/17   Objective: Vitals:   05/16/16 2104 05/17/16 0606 05/17/16 1000 05/17/16 1231  BP: 126/60 (!) 139/42 (!) 120/46   Pulse: 83 85 71   Resp: 18 18 18    Temp: 100.1 F (37.8 C)  98.8 F (37.1 C) 98.5 F (36.9 C)   TempSrc: Oral Oral Oral   SpO2: 91% 93% 94% 93%  Weight: 84.6 kg (186 lb 8.2 oz)     Height:       Weight change: -0.4 kg (-14.1 oz)  Intake/Output Summary (Last 24 hours) at 05/17/16 1848 Last data filed at 05/17/16 1812  Gross per 24 hour  Intake              650 ml  Output                3 ml  Net              647 ml    General: Alert, awake, oriented x3, in no apparent distress today. Well appearing. Moving well in the bed. HEENT: [place]/AT PEERL, Anicteric. Pt also has muddy sclera. Neck: Trachea midline,  no masses, no  thyromegal,y no JVD, no carotid bruit OROPHARYNX:  Moist, No exudate/ erythema/lesions.  Heart: Regular rate and rhythm, without rubs or gallops. She has SEM II/VI at 2nd intercostal space.  PMI non-displaced, no heaves or thrills on palpation.  Lungs: Clear to auscultation but decreased breath sound at bases. No wheezing or rhonchi noted. No increased vocal fremitus resonant to percussion  Abdomen: Soft, nontender, nondistended, positive bowel sounds, no masses no hepatosplenomegaly noted.  Neuro: No focal neurological deficits noted cranial nerves II through XII grossly intact.  Strength at baseline  in bilateral upper. Pt able to move BLE's  Though partial range against gravity. Musculoskeletal: No warmth swelling or erythema around joints, no spinal tenderness noted, but patient does have tenderness noted adjacent to L4-L5 level.  Psychiatric: Patient alert and oriented x3, good insight and cognition, good recent to remote recall.    Data Reviewed: Basic Metabolic Panel:  Recent Labs Lab 05/14/16 0243 05/14/16 1717 05/15/16 0630 05/16/16 1010 05/17/16 1131  NA 140 144 145 140 139  K 5.7* 5.4* 5.0 4.1 4.5  CL 107 112* 110 104 102  CO2 22 22 24 26 25   GLUCOSE 113* 89 92 99 102*  BUN 98* 87* 83* 51* 63*  CREATININE 3.87* 3.59* 3.71* 2.87* 3.39*  CALCIUM 9.6 9.8 10.2 9.7 10.0  PHOS  --  6.5*  --  5.5* 6.4*   Liver  Function Tests:  Recent Labs Lab 05/11/16 1523 05/12/16 0235 05/13/16 2343 05/14/16 0234 05/14/16 1717 05/15/16 0630 05/16/16 1010 05/17/16 1131  AST 47* 40 25  --   --  27  --   --   ALT 15 12* 11*  --   --  12*  --   --   ALKPHOS 150* 142* 148*  --   --  137*  --   --   BILITOT 13.0* 14.1* 15.4* 19.2*  --  15.9*  --   --   PROT 7.2 7.0 6.7  --   --  6.1*  --   --   ALBUMIN 3.4* 3.4* 3.1*  --  2.8* 2.4* 2.4* 2.3*   No results for input(s): LIPASE, AMYLASE in the last 168 hours.  Recent Labs Lab 05/14/16 1740  AMMONIA 46*   CBC:  Recent Labs Lab 05/13/16 2343 05/14/16 0243 05/15/16 0630 05/16/16 0830 05/17/16 1131  WBC 14.6* 16.1* 13.0* 12.2* 12.9*  NEUTROABS 12.0*  --  10.8* 9.3* PENDING  HGB 6.1* 6.3* 6.1* 6.1* 5.5*  HCT 17.6* 18.2* 18.5* 18.3* 16.8*  MCV 91.2 92.9 92.0 92.4 90.3  PLT 184 178 177 180 201   Cardiac Enzymes: No results for input(s): CKTOTAL, CKMB, CKMBINDEX, TROPONINI in the last 168 hours. BNP (last 3 results)  Recent Labs  06/05/15 0407 08/28/15 1216 05/13/16 2343  BNP 683.0* 697.0* 911.7*    ProBNP (last 3 results) No results for input(s): PROBNP in the last 8760 hours.  CBG:  Recent Labs Lab 05/14/16 0317 05/17/16 0835  GLUCAP 107* 106*    Recent Results (from the past 240 hour(s))  Culture, blood (Routine X 2) w Reflex to ID Panel     Status: None (Preliminary result)   Collection Time: 05/14/16  2:35 AM  Result Value Ref Range Status   Specimen Description BLOOD RIGHT ARM  Final   Special Requests IN PEDIATRIC BOTTLE 4CC  Final   Culture   Final    NO GROWTH 3 DAYS Performed at [facility]    Report Status PENDING  Incomplete  Culture, blood (Routine X 2) w Reflex to ID Panel     Status: None (Preliminary result)   Collection Time: 05/14/16  2:40 AM  Result Value Ref Range Status   Specimen Description BLOOD RIGHT HAND  Final   Special Requests BOTTLES DRAWN AEROBIC ONLY 5CC  Final   Culture   Final     NO GROWTH 3 DAYS Performed at [facility]    Report Status PENDING  Incomplete  MRSA PCR Screening     Status: None   Collection Time: 05/14/16  3:57 AM  Result Value Ref Range Status   MRSA by PCR NEGATIVE NEGATIVE Final    Comment:        The GeneXpert MRSA Assay (FDA approved for NASAL specimens only), is one component of a comprehensive MRSA colonization surveillance program. It is not intended to diagnose MRSA infection nor to guide or monitor treatment for MRSA infections.   Culture, Urine     Status: Abnormal   Collection Time: 05/14/16  4:05 AM  Result Value Ref Range Status   Specimen Description URINE, CLEAN CATCH  Final   Special Requests NONE  Final   Culture 40,000 COLONIES/mL PROTEUS MIRABILIS (A)  Final   Report Status 05/16/2016 FINAL  Final   Organism ID, Bacteria PROTEUS MIRABILIS (A)  Final      Susceptibility   Proteus mirabilis - MIC*    AMPICILLIN <=2 SENSITIVE Sensitive     CEFAZOLIN <=4 SENSITIVE Sensitive     CEFTRIAXONE <=1 SENSITIVE Sensitive     CIPROFLOXACIN >=4 RESISTANT Resistant     GENTAMICIN <=1 SENSITIVE Sensitive     IMIPENEM 1 SENSITIVE Sensitive     NITROFURANTOIN 128 RESISTANT Resistant     TRIMETH/SULFA >=320 RESISTANT Resistant     AMPICILLIN/SULBACTAM <=2 SENSITIVE Sensitive     PIP/TAZO <=4 SENSITIVE Sensitive     * 40,000 COLONIES/mL PROTEUS MIRABILIS     Studies: Ct Abdomen Pelvis Wo Contrast  Result Date: 05/14/2016 CLINICAL DATA:  65 y/o F; decreasing hemoglobin with concern for retroperitoneal hemorrhage or hemorrhage at the painful left hip. History of sickle cell disease and cholecystectomy. EXAM: CT ABDOMEN AND PELVIS WITHOUT CONTRAST TECHNIQUE: Multidetector CT imaging of the abdomen and pelvis was performed following the standard protocol without IV contrast. COMPARISON:  05/17/2015 CT abdomen and pelvis. FINDINGS: Lower chest: Severe cardiomegaly. Decreased attenuation of cardiac blood pool compatible with  anemia. Hepatobiliary: No focal liver abnormality is seen. Status post cholecystectomy. No biliary dilatation. Pancreas: Unremarkable. No pancreatic ductal dilatation or surrounding inflammatory changes. Spleen: Status post splenectomy. Adrenals/Urinary Tract: Adrenal glands are unremarkable. Stable left kidney interpolar 26 mm cyst. Kidneys are otherwise normal, without renal calculi, focal lesion, or hydronephrosis. Bladder is unremarkable. Stomach/Bowel: Stomach is within normal limits. Appendix appears normal. No evidence of bowel wall thickening, distention, or inflammatory changes. Few scattered diverticulum of the colon. Vascular/Lymphatic: Aortic atherosclerosis with moderate calcifications. No enlarged abdominal or pelvic lymph nodes. Reproductive: Uterus and bilateral adnexa are unremarkable. Other: No abdominal wall hernia or abnormality. No abdominopelvic ascites. Musculoskeletal: Mixed lucent and sclerotic foci in the right femoral head, left proximal femur, bilateral ilium, and small lucencies in vertebral bodies probably representing multiple bone infarcts given history of sickle cell disease. IMPRESSION: 1. No hemorrhage identified. 2. Severe cardiomegaly. 3. Aortic atherosclerosis. 4. Bony stigmata of sickle cell disease with interval right femoral head avascular necrosis. Electronically Signed   By: Kristine Garbe M.D.   On: 05/14/2016 05:18   Dg Chest  1 View  Result Date: 05/13/2016 CLINICAL DATA:  Hypoxia, history sickle cell anemia, COPD EXAM: CHEST 1 VIEW COMPARISON:  Portable exam 1550 hours compared 06/22/2015 FINDINGS: LEFT jugular Port-A-Cath with tip projecting over SVC. Enlargement of cardiac silhouette with pulmonary vascular congestion. Aortic atherosclerosis. Lungs grossly clear. No definite infiltrate, pleural effusion or pneumothorax. No acute osseous findings. IMPRESSION: Enlargement of cardiac silhouette with pulmonary vascular congestion. No acute abnormalities.  Electronically Signed   By: Lavonia Dana M.D.   On: 05/13/2016 16:05   Ct Head Wo Contrast  Result Date: 05/14/2016 CLINICAL DATA:  65 year old female with acute encephalopathy. The patient is lethargic, disoriented, and generalized weakness. EXAM: CT HEAD WITHOUT CONTRAST TECHNIQUE: Contiguous axial images were obtained from the base of the skull through the vertex without intravenous contrast. COMPARISON:  None. FINDINGS: Brain: The ventricles and sulci are appropriate in size for patient's age. Mild periventricular and deep white matter chronic microvascular ischemic changes noted. There is no acute intracranial hemorrhage. No mass effect or midline shift noted. Vascular: No hyperdense vessel or unexpected calcification. Skull: There is diffuse ground-glass appearance of the skull with "Salt and pepper" appearance may be related to underlying hyperparathyroidism. Multiple lucent lesions within the skull may represent brown tumor of the bone secondary to hyperparathyroidism. Correlation with clinical exam and parathyroid function tests recommended. Sinuses/Orbits: No acute finding. Other: None IMPRESSION: No acute intracranial pathology. Mild age-related atrophy and chronic microvascular ischemic changes. Ground-glass appearance of the skull suggestive of hyperparathyroidism. Correlation with clinical exam and blood calcium levels recommended. Electronically Signed   By: Anner Crete M.D.   On: 05/14/2016 05:17   Mr Lumbar Spine Wo Contrast  Result Date: 05/14/2016 CLINICAL DATA:  Back pain at L4-5.  History of sickle cell anemia. EXAM: MRI LUMBAR SPINE WITHOUT CONTRAST TECHNIQUE: Multiplanar, multisequence MR imaging of the lumbar spine was performed. No intravenous contrast was administered. Patient was unable to complete examination due to pain, axial T1 sequence not obtained. COMPARISON:  CT abdomen and pelvis May 14, 2016 at 0432 hours FINDINGS: Mild motion degraded examination.  SEGMENTATION: For the purposes of this report, the last well-formed intervertebral disc will be described as L5-S1. ALIGNMENT: Maintenance of the lumbar lordosis. Minimal grade 1 L4-5 anterolisthesis without spondylolysis. VERTEBRAE:Markedly decreased bone marrow signal on all sequences compatible with bone marrow replacement/reactivation. Vertebral bodies are intact. Mild L4-5 disc height loss, with slight desiccation wall this suspected. Multilevel mild chronic discogenic endplate changes. Focal bright T1 and bright T2 signal in the LEFT ventral L5 vertebral body most compatible with hemangioma. CONUS MEDULLARIS: Conus medullaris terminates at L1-2 and demonstrates normal morphology and signal characteristics. Limited assessment of cauda equina due to mild patient motion and lack of T1 sequence. PARASPINAL AND SOFT TISSUES: Included prevertebral and paraspinal soft tissues are nonsuspicious, moderate symmetric paraspinal muscle atrophy. Renal cysts better seen on today's CT abdomen and pelvis. DISC LEVELS: L1-2 and L2-3: Potential annular fissures without significant disc bulge, canal stenosis or neural foraminal narrowing. L3-4: Small LEFT extraforaminal disc protrusion could affect the exited LEFT L4 nerve. Mild facet arthropathy and ligamentum flavum redundancy. Mild RIGHT, mild to moderate LEFT neural foraminal narrowing. L4-5: Widened facets to 5 mm with RIGHT greater than LEFT moderate facet arthropathy. Moderate broad-based disc bulge asymmetric to the RIGHT encroaches upon the exited RIGHT L4 nerve. Mild canal stenosis. Moderate to severe RIGHT, mild LEFT neural foraminal narrowing. L5-S1: Small broad-based disc bulge. Mild facet arthropathy and ligamentum flavum redundancy without canal stenosis. Mild RIGHT neural  foraminal narrowing. IMPRESSION: Abnormally decreased bone marrow signal attributed to patient's sickle cell anemia though can be seen with other myeloproliferative disorders. Moderate L4-5  facet arthropathy, widened facets associated inflammatory changes and dynamic instability. Minimal grade 1 L4-5 anterolisthesis. This would be better characterized on flexion extension radiographs. Mild canal stenosis L4-5. Neural foraminal narrowing L3-4 through L5-S1: Moderate to severe at L4-5. Electronically Signed   By: Elon Alas M.D.   On: 05/14/2016 22:42   Dg Hip Unilat With Pelvis 2-3 Views Left  Result Date: 05/11/2016 CLINICAL DATA:  Hip pain. EXAM: DG HIP (WITH OR WITHOUT PELVIS) 2-3V LEFT COMPARISON:  None. FINDINGS: There is no evidence of hip fracture or dislocation. There is no evidence of arthropathy or other focal bone abnormality. IMPRESSION: Normal left hip. Electronically Signed   By: Marijo Conception, M.D.   On: 05/11/2016 15:59    Scheduled Meds: . sodium chloride   Intravenous Once  . amiodarone  100 mg Oral Daily  . calcitRIOL  0.25 mcg Oral Daily  . [START ON 05/18/2016] feeding supplement (NEPRO CARB STEADY)  237 mL Oral BID BM  . fluticasone furoate-vilanterol  1 puff Inhalation Daily  . folic acid  1 mg Oral Daily  . hydroxyurea  500 mg Oral Daily  . morphine  15 mg Oral Once  . pantoprazole  40 mg Oral Daily  . senna-docusate  1 tablet Oral BID  . sodium bicarbonate  650 mg Oral BID  . sodium chloride flush  10-40 mL Intracatheter Q12H   Continuous Infusions:  Principal Problem:   Acute encephalopathy Active Problems:   Left hip pain   Elevated bilirubin   Sickle cell crisis (HCC)   AKI (acute kidney injury) ([place])   CKD (chronic kidney disease) stage 5, GFR less than 15 ml/min (HCC)   Valvular heart disease   Acute respiratory failure with hypoxia (HCC)    In excess of 25 minutes spent during this visit. 50% involved face to face contact with the patient for assessment, counseling and coordination of care.

## 2016-05-17 NOTE — Evaluation (Signed)
Physical Therapy Evaluation Patient Details Name: Jane Short MRN: [id number] DOB: 11/03/1950 Today's Date: 05/17/2016   History of Present Illness  Patient is a 65 yo female admitted 05/13/16 with Lt hip pain and anemia - sickle cell crisis.  Patient also with acute encephalopathy, AKI for HD, UTI, weakness.   PMH:  sickle cell disease, CKD, anemia, COPD, CHF, chronic pain, valvular heart disease, OSA.  Has home O2.  Clinical Impression  Patient presents with problems listed below.  Will benefit from acute PT to maximize functional mobility prior to discharge.  Patient was ambulating with AD pta.  Today patient unable to stand with +2 max assist.  Feel patient will need a longer time for recovery.  Recommend SNF at d/c for continued therapy.    Follow Up Recommendations SNF;Supervision/Assistance - 24 hour    Equipment Recommendations  None recommended by PT    Recommendations for Other Services       Precautions / Restrictions Precautions Precautions: Fall Restrictions Weight Bearing Restrictions: No      Mobility  Bed Mobility Overal bed mobility: Needs Assistance Bed Mobility: Supine to Sit;Sit to Supine     Supine to sit: Mod assist Sit to supine: Max assist   General bed mobility comments: Required assist to move LLE off of bed and to raise trunk to sitting position.  Good sitting balance once upright.  Required max assist to return to supine, controlling trunk and bringing LE's onto bed.  Transfers Overall transfer level: Needs assistance Equipment used:  Charlaine Dalton) Transfers: Sit to/from Stand Sit to Stand: Max assist;+2 physical assistance         General transfer comment: Verbal cues for hand placement and technique.  Patient unable to lift hips from bed.  Encouraged patient to pull up with UE's and to use RLE to push up to standing.  Patient unable to move to standing with +2 max assist and 3 attempts.  Patient with poor effort ? due to focus on pain in  LLE,  Ambulation/Gait             General Gait Details: Unable  Stairs            Wheelchair Mobility    Modified Rankin (Stroke Patients Only)       Balance Overall balance assessment: Needs assistance Sitting-balance support: No upper extremity supported;Feet supported Sitting balance-Leahy Scale: Good                                       Pertinent Vitals/Pain Pain Assessment: 0-10 Pain Score: 7  Pain Location: Lt knee with movement and weight bearing Pain Descriptors / Indicators: Aching;Grimacing;Guarding;Sharp Pain Intervention(s): Limited activity within patient's tolerance;Monitored during session;Repositioned    Home Living Family/patient expects to be discharged to:: Skilled nursing facility Living Arrangements: Spouse/significant other Available Help at Discharge: Family Type of Home: House Home Access: Stairs to enter Entrance Stairs-Rails: Right;Left;Can reach both Entrance Stairs-Number of Steps: 5 Home Layout: One level Home Equipment: Environmental consultant - 2 wheels;Cane - single point;Wheelchair - manual (Pt. has a BSC in home in [place], not in their home here. )      Prior Function Level of Independence: Independent with assistive device(s)         Comments: reports she ambulates using SPC in home and uses WC for long distances     Hand Dominance        Extremity/Trunk  Assessment   Upper Extremity Assessment Upper Extremity Assessment: Defer to OT evaluation    Lower Extremity Assessment Lower Extremity Assessment: Generalized weakness;LLE deficits/detail LLE Deficits / Details: Decreased knee ROM and strength.  Unable to fully extend knee.  Unable to extend knee in sitting against gravity, strength grossly 2+/5 LLE: Unable to fully assess due to pain LLE Coordination: decreased gross motor       Communication   Communication: No difficulties  Cognition Arousal/Alertness: Awake/alert Behavior During Therapy: WFL for  tasks assessed/performed;Flat affect Overall Cognitive Status: Within Functional Limits for tasks assessed                 General Comments: Slow processing information    General Comments      Exercises     Assessment/Plan    PT Assessment Patient needs continued PT services  PT Problem List Decreased strength;Decreased activity tolerance;Decreased range of motion;Decreased balance;Decreased mobility;Decreased coordination;Decreased knowledge of use of DME;Obesity;Decreased skin integrity;Pain          PT Treatment Interventions DME instruction;Gait training;Functional mobility training;Therapeutic activities;Therapeutic exercise;Balance training;Patient/family education    PT Goals (Current goals can be found in the Care Plan section)  Acute Rehab PT Goals Patient Stated Goal: To decrease pain PT Goal Formulation: With patient/family Time For Goal Achievement: 05/31/16 Potential to Achieve Goals: Good    Frequency Min 3X/week   Barriers to discharge        Co-evaluation               End of Session Equipment Utilized During Treatment: Gait belt;Other (comment) (Lift equipment) Activity Tolerance: Patient limited by pain Patient left: in bed;with call bell/phone within reach;with bed alarm set;with family/visitor present Nurse Communication: Mobility status;Need for lift equipment         Time: 1432-1449 PT Time Calculation (min) (ACUTE ONLY): 17 min   Charges:   PT Evaluation $PT Eval Moderate Complexity: 1 Procedure     PT G Codes:        Despina Pole Jun 05, 2016, 4:04 PM Carita Pian. Sanjuana Kava, [place] Pager [phone]

## 2016-05-17 NOTE — Progress Notes (Signed)
[facility] Progress Note   Subjective:  AAO to self, location, year, month today; an improvement NAD, no complaints Temp Fem HD c ath removed  Vitals:   05/16/16 1451 05/16/16 1752 05/16/16 2104 05/17/16 0606  BP: (!) 124/56 (!) 121/36 126/60 (!) 139/42  Pulse: 88 83 83 85  Resp: 20 18 18 18   Temp: 98.2 F (36.8 C) 99.3 F (37.4 C) 100.1 F (37.8 C) 98.8 F (37.1 C)  TempSrc: Oral Oral Oral Oral  SpO2: 97% 95% 91% 93%  Weight:   84.6 kg (186 lb 8.2 oz)   Height:        Inpatient medications: . amiodarone  100 mg Oral Daily  . calcitRIOL  0.25 mcg Oral Daily  . cefTRIAXone (ROCEPHIN)  IV  2 g Intravenous Q24H  . fluticasone furoate-vilanterol  1 puff Inhalation Daily  . folic acid  1 mg Oral Daily  . hydroxyurea  500 mg Oral Daily  . morphine  15 mg Oral Once  . pantoprazole  40 mg Oral Daily  . senna-docusate  1 tablet Oral BID  . sodium bicarbonate  650 mg Oral BID  . sodium chloride flush  10-40 mL Intracatheter Q12H    morphine, ondansetron **OR** ondansetron (ZOFRAN) IV, polyethylene glycol, sodium chloride flush  Exam: Gen awake, alert, conversatnt No rash, cyanosis or gangrene Sclera anicteric, throat clear slightly dry  Mild JVD Chest clear bilat, no rales or wheezing RRR loud SEM, no RG Abd soft ntnd no mass or ascites +bs   MS no joint effusions or deformity Ext no LE or UE edema /  Neuro alert today, Ox 2, no asterixis    Assessment: 1. CKD5 with AMS/Asterixus: s/p HD x1 12/15 for suspected uremia.  No asterixus today and AAO x3 1. Follows with [facility] 2. At near baseline SCr  2. Sickle cell disease 3. Anemia - Hb 6's, defer transfusion decision to Dr Zigmund Daniel in sickle cell pt 4. COPD 5. Hx CHF - no extra vol on exam 6. Leukocytosis: U Cx with low level bacteruria and B Cx NGTD; On ceftriaxone  Plan -   Watch for another 24h with labs  Either will be HD dependent and will obtain [facility] or return to baseline;  favorable she will not need HD moving forward   05/17/2016, 7:19 AM    Recent Labs Lab 05/14/16 1717 05/15/16 0630 05/16/16 1010  NA 144 145 140  K 5.4* 5.0 4.1  CL 112* 110 104  CO2 22 24 26   GLUCOSE 89 92 99  BUN 87* 83* 51*  CREATININE 3.59* 3.71* 2.87*  CALCIUM 9.8 10.2 9.7  PHOS 6.5*  --  5.5*    Recent Labs Lab 05/12/16 0235 05/13/16 2343 05/14/16 0234 05/14/16 1717 05/15/16 0630 05/16/16 1010  AST 40 25  --   --  27  --   ALT 12* 11*  --   --  12*  --   ALKPHOS 142* 148*  --   --  137*  --   BILITOT 14.1* 15.4* 19.2*  --  15.9*  --   PROT 7.0 6.7  --   --  6.1*  --   ALBUMIN 3.4* 3.1*  --  2.8* 2.4* 2.4*    Recent Labs Lab 05/13/16 2343 05/14/16 0243 05/15/16 0630 05/16/16 0830  WBC 14.6* 16.1* 13.0* 12.2*  NEUTROABS 12.0*  --  10.8* 9.3*  HGB 6.1* 6.3* 6.1* 6.1*  HCT 17.6* 18.2* 18.5* 18.3*  MCV 91.2 92.9 92.0  92.4  PLT 184 178 177 180   Iron/TIBC/Ferritin/ %Sat    Component Value Date/Time   IRON 166 04/20/2016 0938   IRON 127 05/30/2014 0920   TIBC 324 04/20/2016 0938   TIBC 306 05/30/2014 0920   FERRITIN 477 (H) 04/20/2016 0938   FERRITIN 175 05/30/2014 0920   IRONPCTSAT 51 (H) 04/20/2016 0938   IRONPCTSAT 42 05/30/2014 0920

## 2016-05-18 ENCOUNTER — Inpatient Hospital Stay: Payer: Medicare Other

## 2016-05-18 ENCOUNTER — Inpatient Hospital Stay (HOSPITAL_COMMUNITY): Payer: Medicare Other

## 2016-05-18 DIAGNOSIS — M25462 Effusion, left knee: Secondary | ICD-10-CM

## 2016-05-18 LAB — TYPE AND SCREEN
ABO/RH(D): O POS
Antibody Screen: NEGATIVE
UNIT DIVISION: 0
Unit division: 0

## 2016-05-18 LAB — RENAL FUNCTION PANEL
Albumin: 2.4 g/dL — ABNORMAL LOW (ref 3.5–5.0)
Anion gap: 12 (ref 5–15)
BUN: 73 mg/dL — AB (ref 6–20)
CHLORIDE: 98 mmol/L — AB (ref 101–111)
CO2: 27 mmol/L (ref 22–32)
CREATININE: 3.8 mg/dL — AB (ref 0.44–1.00)
Calcium: 9.8 mg/dL (ref 8.9–10.3)
GFR calc Af Amer: 13 mL/min — ABNORMAL LOW (ref >60)
GFR, EST NON AFRICAN AMERICAN: 12 mL/min — AB (ref >60)
GLUCOSE: 104 mg/dL — AB (ref 65–99)
POTASSIUM: 4.3 mmol/L (ref 3.5–5.1)
Phosphorus: 6.4 mg/dL — ABNORMAL HIGH (ref 2.5–4.6)
Sodium: 137 mmol/L (ref 135–145)

## 2016-05-18 LAB — HEMOGLOBINOPATHY EVALUATION
HGB C: 0 %
Hgb A2 Quant: 3.7 % — ABNORMAL HIGH (ref 0.7–3.1)
Hgb A: 39.7 % — ABNORMAL LOW (ref 94.0–98.0)
Hgb F Quant: 3.5 % — ABNORMAL HIGH (ref 0.0–2.0)
Hgb S Quant: 53.1 % — ABNORMAL HIGH

## 2016-05-18 NOTE — Care Management Important Message (Signed)
Important Message  Patient Details  Name: Jane Short MRN: [id number] Date of Birth: [birth year]   Medicare Important Message Given:  Yes    [name] [name] 05/18/2016, 11:53 AM

## 2016-05-18 NOTE — Progress Notes (Signed)
SICKLE CELL SERVICE PROGRESS NOTE  Jane Short [id number] DOB: 18-Feb-1951 DOA: 05/13/2016 PCP: Tommi Rumps, MD  Assessment/Plan: Principal Problem:   Acute encephalopathy Active Problems:   Left hip pain   Elevated bilirubin   Sickle cell crisis (HCC)   AKI (acute kidney injury) ([place])   CKD (chronic kidney disease) stage 5, GFR less than 15 ml/min (HCC)   Valvular heart disease   Acute respiratory failure with hypoxia ([place])  1. Acute Pain:  X-Ray knee shows large effusion. Will consult Orthopedic Surgery for aspiration and evaluation of fluid.Knee joint shows no warmth or erythema which is reassuring. No further pain in the back  even with movement today. She may need to be evaluated by Neurosurgery or Orthopedic surgery as an out patient. She has not required any further pain medications.  2. Gait Abnormality: Pt was evaluated by PT yesterday and recommendations were made for SNF for rehab. SW Lorriane Shire) not currently available (speaking with family). However spoke with CM who will speak with SW when she returns re: SNF placement for rehab. 3. Anemia of Chronic Disease: Pt has a crhronic anemia and Hb from yesterday 5.5 g/dL which is her baseline. Furthermore pt asymptomatic. Will hold on transfusion. Aranesp given on Thursday in place of Epogen. Will re-eval HB tomorrow. 4. Hypoxia: Saturations 97-100% on RA today.    5. Acute on CKD IV:  Her baseline Cr is 3.8 and baseline BUN 56. BUN and Cr increased mildly. Will defer to Nephrology. 6. Chronic Hemolysis: Pt would benefit from a therapeutic dose of Hydrea to induce a more stable population of Hb however per her records she has a h/o Lichen Planus associate with Hydrea use. There is no data to support that sub-theapeutic dose. Will D/C Hydrea in light of low Hb. 7. Hb SS without crisis: Pt is not in crisis.She is opiate naive and has been using Aleve for her hip pains. She however has CKD IV and NSAID's are contraindicated.  Continue Folic Acid.  8. Hyperkalemia: Resolved.  9. Elevated BNP: Uninterpretable in a setting of CKD and Cardiac Valvular disease. 10. UTI: Improved: U/A findings consistent with UTI . On day #3 of Rocephin. Urine culture shows 40,000 colonies of Proteus Mirabilis which is sensitive to Rocephin. Pt has completed 3 days of therapy. Antibiotics discontinued yesterday. 11. Leukocytosis: Will check labs tomorrow.  12. Gait abnormality and weakness:  PT and OT input appreciated. 13. Constipation:  Continue Senna-S.  14. Valvular Heart Disease with Mild decrease in Systolic Function: Continue Pacerone and Lasix held as patient euvolemic.  2-ECHO shows stable findings since last ECHO.  15. 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  17. Lichen Planus:Please note that nursing documented stage II pressure ulcer on RLE. However this is not a pressure ulcer but rather healed Lichen Planus lesions 18. Chronic pain: Pt usually takes Aleve for chronic pain in her hips despite CKD and being advised against taking this.    Code Status: Full Code Family Communication: N/A discussed with both patient and husband. Disposition Plan: Not yet ready for discharge.   Nagee Goates A.  Pager [phone]. If 7PM-7AM, please contact night-coverage.  05/18/2016, 6:43 PM  LOS: 5 days   Interim History: This is an opiate naive patient with Hb ss, CKD IV, Aortic and A-V Valvular disease, CHF, Idiopathic Pulmonary HTN and OSA who was admitted to [facility] with hemolytic anemia and sickle cell crisis. Per her husband and confirmed by patient, her pain began  when she had a car ride to their home in [place]. She was seen in the ED as the pain was excruciating and she was unable to tolerate it. She received "a shot" and was discharged with a prescription for Morphine 15 mg. She states that she took about 4 doses but had about 3 episodes of emesis which she attributes to  the Morphine. However their is not a strong temporal relationship between Morphine and emesis. She did have significant itching with the Morphine. She was seen by her Hematologist in the office and found to have a Hb of 5.0 g/dL so was sent to the hospital for further evaluation.  At [facility], she was transfused 2 units RBC's for a 5.0 g/dL which improved to 7.6 g/dL after transfusion. Today her Hb is 6.1 g/dL. Per her records, her baseline Hb is  She normally takes . However her LDH is down to 646 from 901.  She also has chronically elevated bilirubin likely due to chronic hemolysis. She reports that her dry weight is 167 #'s and she takes Lasix 80 mg daily expect when her weight is increased she takes it BID. He last lasix was 6 days ago and her weight today is 192 #'s. However a review of office visits in the last year show weights of 194 3's.  Her clinical course was consistennt with Uremia and she was seen by Nephrology and transferred to [facility] for dialysis. Pt is much more alert and well appearing today. She reports that pain in knee improved and minimal after heat to knee by PT.  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 I have also reviewed and a TEE done by Dr. 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 However per her husband, it was felt that surgery would be a greater risk to the patient given her overall medical condition. Review of her Hb electrophoresis from [facility] is consistent with Hb SS in a transfused patient. She also has a h/o idiopathic pul;monary HTN and she a smoking history but is not a current smoker. However has a Dx of COPD and is on Ellipta.  Review of labs shows previous BNP of 911. BNP to be 680 and as high as 901 in the past.   Consultants:  None  Procedures:  None  Antibiotics:  Rocephin 12/14 >>12/17   Objective: Vitals:   05/18/16 0910  05/18/16 0919 05/18/16 1045 05/18/16 1752  BP:   (!) 110/53 106/60  Pulse:   67 72  Resp:   18 18  Temp:   97.2 F (36.2 C) 97.4 F (36.3 C)  TempSrc:   Oral Oral  SpO2: (!) 87% 96% 96% 97%  Weight:      Height:       Weight change: 0.7 kg (1 lb 8.7 oz)  Intake/Output Summary (Last 24 hours) at 05/18/16 1843 Last data filed at 05/18/16 1750  Gross per 24 hour  Intake              540 ml  Output              250 ml  Net              290 ml    General: Alert, awake, oriented x3, in no apparent distress today. Well appearing. Moving well in the bed. HEENT: [place]/AT PEERL, Anicteric. Pt also has muddy  sclera. Neck: Trachea midline,  no masses, no thyromegal,y no JVD, no carotid bruit OROPHARYNX:  Moist, No exudate/ erythema/lesions.  Heart: Regular rate and rhythm, without rubs or gallops. She has SEM II/VI at 2nd intercostal space.  PMI non-displaced, no heaves or thrills on palpation.  Lungs: Clear to auscultation but decreased breath sound at bases. No wheezing or rhonchi noted. No increased vocal fremitus resonant to percussion  Abdomen: Soft, nontender, nondistended, positive bowel sounds, no masses no hepatosplenomegaly noted.  Neuro: No focal neurological deficits noted cranial nerves II through XII grossly intact.  Strength at baseline  in bilateral upper. Pt able to move BLE's  Though partial range against gravity. Musculoskeletal: No warmth  or erythema noted around joints, but there is swelling noted at the left knee. No spinal tenderness noted today. Psychiatric: Patient alert and oriented x3, good insight and cognition, good recent to remote recall.    Data Reviewed: Basic Metabolic Panel:  Recent Labs Lab 05/14/16 1717 05/15/16 0630 05/16/16 1010 05/17/16 1131 05/18/16 0347  NA 144 145 140 139 137  K 5.4* 5.0 4.1 4.5 4.3  CL 112* 110 104 102 98*  CO2 22 24 26 25 27   GLUCOSE 89 92 99 102* 104*  BUN 87* 83* 51* 63* 73*  CREATININE 3.59* 3.71* 2.87* 3.39* 3.80*   CALCIUM 9.8 10.2 9.7 10.0 9.8  PHOS 6.5*  --  5.5* 6.4* 6.4*   Liver Function Tests:  Recent Labs Lab 05/12/16 0235 05/13/16 2343 05/14/16 0234 05/14/16 1717 05/15/16 0630 05/16/16 1010 05/17/16 1131 05/18/16 0347  AST 40 25  --   --  27  --   --   --   ALT 12* 11*  --   --  12*  --   --   --   ALKPHOS 142* 148*  --   --  137*  --   --   --   BILITOT 14.1* 15.4* 19.2*  --  15.9*  --   --   --   PROT 7.0 6.7  --   --  6.1*  --   --   --   ALBUMIN 3.4* 3.1*  --  2.8* 2.4* 2.4* 2.3* 2.4*   No results for input(s): LIPASE, AMYLASE in the last 168 hours.  Recent Labs Lab 05/14/16 1740  AMMONIA 46*   CBC:  Recent Labs Lab 05/13/16 2343 05/14/16 0243 05/15/16 0630 05/16/16 0830 05/17/16 1131  WBC 14.6* 16.1* 13.0* 12.2* 12.9*  NEUTROABS 12.0*  --  10.8* 9.3* 7.6  HGB 6.1* 6.3* 6.1* 6.1* 5.5*  HCT 17.6* 18.2* 18.5* 18.3* 16.8*  MCV 91.2 92.9 92.0 92.4 90.3  PLT 184 178 177 180 201   Cardiac Enzymes: No results for input(s): CKTOTAL, CKMB, CKMBINDEX, TROPONINI in the last 168 hours. BNP (last 3 results)  Recent Labs  06/05/15 0407 08/28/15 1216 05/13/16 2343  BNP 683.0* 697.0* 911.7*    ProBNP (last 3 results) No results for input(s): PROBNP in the last 8760 hours.  CBG:  Recent Labs Lab 05/14/16 0317 05/17/16 0835  GLUCAP 107* 106*    Recent Results (from the past 240 hour(s))  Culture, blood (Routine X 2) w Reflex to ID Panel     Status: None (Preliminary result)   Collection Time: 05/14/16  2:35 AM  Result Value Ref Range Status   Specimen Description BLOOD RIGHT ARM  Final   Special Requests IN PEDIATRIC BOTTLE 4CC  Final   Culture   Final    NO GROWTH  4 DAYS Performed at [facility]    Report Status PENDING  Incomplete  Culture, blood (Routine X 2) w Reflex to ID Panel     Status: None (Preliminary result)   Collection Time: 05/14/16  2:40 AM  Result Value Ref Range Status   Specimen Description BLOOD RIGHT HAND  Final    Special Requests BOTTLES DRAWN AEROBIC ONLY 5CC  Final   Culture   Final    NO GROWTH 4 DAYS Performed at [facility]    Report Status PENDING  Incomplete  MRSA PCR Screening     Status: None   Collection Time: 05/14/16  3:57 AM  Result Value Ref Range Status   MRSA by PCR NEGATIVE NEGATIVE Final    Comment:        The GeneXpert MRSA Assay (FDA approved for NASAL specimens only), is one component of a comprehensive MRSA colonization surveillance program. It is not intended to diagnose MRSA infection nor to guide or monitor treatment for MRSA infections.   Culture, Urine     Status: Abnormal   Collection Time: 05/14/16  4:05 AM  Result Value Ref Range Status   Specimen Description URINE, CLEAN CATCH  Final   Special Requests NONE  Final   Culture 40,000 COLONIES/mL PROTEUS MIRABILIS (A)  Final   Report Status 05/16/2016 FINAL  Final   Organism ID, Bacteria PROTEUS MIRABILIS (A)  Final      Susceptibility   Proteus mirabilis - MIC*    AMPICILLIN <=2 SENSITIVE Sensitive     CEFAZOLIN <=4 SENSITIVE Sensitive     CEFTRIAXONE <=1 SENSITIVE Sensitive     CIPROFLOXACIN >=4 RESISTANT Resistant     GENTAMICIN <=1 SENSITIVE Sensitive     IMIPENEM 1 SENSITIVE Sensitive     NITROFURANTOIN 128 RESISTANT Resistant     TRIMETH/SULFA >=320 RESISTANT Resistant     AMPICILLIN/SULBACTAM <=2 SENSITIVE Sensitive     PIP/TAZO <=4 SENSITIVE Sensitive     * 40,000 COLONIES/mL PROTEUS MIRABILIS     Studies: Ct Abdomen Pelvis Wo Contrast  Result Date: 05/14/2016 CLINICAL DATA:  65 y/o F; decreasing hemoglobin with concern for retroperitoneal hemorrhage or hemorrhage at the painful left hip. History of sickle cell disease and cholecystectomy. EXAM: CT ABDOMEN AND PELVIS WITHOUT CONTRAST TECHNIQUE: Multidetector CT imaging of the abdomen and pelvis was performed following the standard protocol without IV contrast. COMPARISON:  05/17/2015 CT abdomen and pelvis. FINDINGS: Lower chest: Severe  cardiomegaly. Decreased attenuation of cardiac blood pool compatible with anemia. Hepatobiliary: No focal liver abnormality is seen. Status post cholecystectomy. No biliary dilatation. Pancreas: Unremarkable. No pancreatic ductal dilatation or surrounding inflammatory changes. Spleen: Status post splenectomy. Adrenals/Urinary Tract: Adrenal glands are unremarkable. Stable left kidney interpolar 26 mm cyst. Kidneys are otherwise normal, without renal calculi, focal lesion, or hydronephrosis. Bladder is unremarkable. Stomach/Bowel: Stomach is within normal limits. Appendix appears normal. No evidence of bowel wall thickening, distention, or inflammatory changes. Few scattered diverticulum of the colon. Vascular/Lymphatic: Aortic atherosclerosis with moderate calcifications. No enlarged abdominal or pelvic lymph nodes. Reproductive: Uterus and bilateral adnexa are unremarkable. Other: No abdominal wall hernia or abnormality. No abdominopelvic ascites. Musculoskeletal: Mixed lucent and sclerotic foci in the right femoral head, left proximal femur, bilateral ilium, and small lucencies in vertebral bodies probably representing multiple bone infarcts given history of sickle cell disease. IMPRESSION: 1. No hemorrhage identified. 2. Severe cardiomegaly. 3. Aortic atherosclerosis. 4. Bony stigmata of sickle cell disease with interval right femoral head avascular necrosis. Electronically Signed  By: Kristine Garbe M.D.   On: 05/14/2016 05:18   Dg Chest 1 View  Result Date: 05/13/2016 CLINICAL DATA:  Hypoxia, history sickle cell anemia, COPD EXAM: CHEST 1 VIEW COMPARISON:  Portable exam 1550 hours compared 06/22/2015 FINDINGS: LEFT jugular Port-A-Cath with tip projecting over SVC. Enlargement of cardiac silhouette with pulmonary vascular congestion. Aortic atherosclerosis. Lungs grossly clear. No definite infiltrate, pleural effusion or pneumothorax. No acute osseous findings. IMPRESSION: Enlargement of cardiac  silhouette with pulmonary vascular congestion. No acute abnormalities. Electronically Signed   By: Lavonia Dana M.D.   On: 05/13/2016 16:05   Dg Knee 1-2 Views Left  Result Date: 05/18/2016 CLINICAL DATA:  Left knee pain. EXAM: LEFT KNEE - 1-2 VIEW COMPARISON:  None. FINDINGS: No acute fracture or dislocation. Osteochondral lesion involving the patella. Large joint effusion. Severe osteopenia. IMPRESSION: Osteochondral lesion of the patella.  Large joint effusion. Electronically Signed   By: Kathreen Devoid   On: 05/18/2016 11:15   Ct Head Wo Contrast  Result Date: 05/14/2016 CLINICAL DATA:  65 year old female with acute encephalopathy. The patient is lethargic, disoriented, and generalized weakness. EXAM: CT HEAD WITHOUT CONTRAST TECHNIQUE: Contiguous axial images were obtained from the base of the skull through the vertex without intravenous contrast. COMPARISON:  None. FINDINGS: Brain: The ventricles and sulci are appropriate in size for patient's age. Mild periventricular and deep white matter chronic microvascular ischemic changes noted. There is no acute intracranial hemorrhage. No mass effect or midline shift noted. Vascular: No hyperdense vessel or unexpected calcification. Skull: There is diffuse ground-glass appearance of the skull with "Salt and pepper" appearance may be related to underlying hyperparathyroidism. Multiple lucent lesions within the skull may represent brown tumor of the bone secondary to hyperparathyroidism. Correlation with clinical exam and parathyroid function tests recommended. Sinuses/Orbits: No acute finding. Other: None IMPRESSION: No acute intracranial pathology. Mild age-related atrophy and chronic microvascular ischemic changes. Ground-glass appearance of the skull suggestive of hyperparathyroidism. Correlation with clinical exam and blood calcium levels recommended. Electronically Signed   By: Anner Crete M.D.   On: 05/14/2016 05:17   Mr Lumbar Spine Wo  Contrast  Result Date: 05/14/2016 CLINICAL DATA:  Back pain at L4-5.  History of sickle cell anemia. EXAM: MRI LUMBAR SPINE WITHOUT CONTRAST TECHNIQUE: Multiplanar, multisequence MR imaging of the lumbar spine was performed. No intravenous contrast was administered. Patient was unable to complete examination due to pain, axial T1 sequence not obtained. COMPARISON:  CT abdomen and pelvis May 14, 2016 at 0432 hours FINDINGS: Mild motion degraded examination. SEGMENTATION: For the purposes of this report, the last well-formed intervertebral disc will be described as L5-S1. ALIGNMENT: Maintenance of the lumbar lordosis. Minimal grade 1 L4-5 anterolisthesis without spondylolysis. VERTEBRAE:Markedly decreased bone marrow signal on all sequences compatible with bone marrow replacement/reactivation. Vertebral bodies are intact. Mild L4-5 disc height loss, with slight desiccation wall this suspected. Multilevel mild chronic discogenic endplate changes. Focal bright T1 and bright T2 signal in the LEFT ventral L5 vertebral body most compatible with hemangioma. CONUS MEDULLARIS: Conus medullaris terminates at L1-2 and demonstrates normal morphology and signal characteristics. Limited assessment of cauda equina due to mild patient motion and lack of T1 sequence. PARASPINAL AND SOFT TISSUES: Included prevertebral and paraspinal soft tissues are nonsuspicious, moderate symmetric paraspinal muscle atrophy. Renal cysts better seen on today's CT abdomen and pelvis. DISC LEVELS: L1-2 and L2-3: Potential annular fissures without significant disc bulge, canal stenosis or neural foraminal narrowing. L3-4: Small LEFT extraforaminal disc protrusion could affect the  exited LEFT L4 nerve. Mild facet arthropathy and ligamentum flavum redundancy. Mild RIGHT, mild to moderate LEFT neural foraminal narrowing. L4-5: Widened facets to 5 mm with RIGHT greater than LEFT moderate facet arthropathy. Moderate broad-based disc bulge asymmetric  to the RIGHT encroaches upon the exited RIGHT L4 nerve. Mild canal stenosis. Moderate to severe RIGHT, mild LEFT neural foraminal narrowing. L5-S1: Small broad-based disc bulge. Mild facet arthropathy and ligamentum flavum redundancy without canal stenosis. Mild RIGHT neural foraminal narrowing. IMPRESSION: Abnormally decreased bone marrow signal attributed to patient's sickle cell anemia though can be seen with other myeloproliferative disorders. Moderate L4-5 facet arthropathy, widened facets associated inflammatory changes and dynamic instability. Minimal grade 1 L4-5 anterolisthesis. This would be better characterized on flexion extension radiographs. Mild canal stenosis L4-5. Neural foraminal narrowing L3-4 through L5-S1: Moderate to severe at L4-5. Electronically Signed   By: Elon Alas M.D.   On: 05/14/2016 22:42   Dg Hip Unilat With Pelvis 2-3 Views Left  Result Date: 05/11/2016 CLINICAL DATA:  Hip pain. EXAM: DG HIP (WITH OR WITHOUT PELVIS) 2-3V LEFT COMPARISON:  None. FINDINGS: There is no evidence of hip fracture or dislocation. There is no evidence of arthropathy or other focal bone abnormality. IMPRESSION: Normal left hip. Electronically Signed   By: Marijo Conception, M.D.   On: 05/11/2016 15:59    Scheduled Meds: . sodium chloride   Intravenous Once  . amiodarone  100 mg Oral Daily  . calcitRIOL  0.25 mcg Oral Daily  . feeding supplement (NEPRO CARB STEADY)  237 mL Oral BID BM  . fluticasone furoate-vilanterol  1 puff Inhalation Daily  . folic acid  1 mg Oral Daily  . hydroxyurea  500 mg Oral Daily  . morphine  15 mg Oral Once  . pantoprazole  40 mg Oral Daily  . senna-docusate  1 tablet Oral BID  . sodium bicarbonate  650 mg Oral BID  . sodium chloride flush  10-40 mL Intracatheter Q12H   Continuous Infusions:  Principal Problem:   Acute encephalopathy Active Problems:   Left hip pain   Elevated bilirubin   Sickle cell crisis (HCC)   AKI (acute kidney injury)  ([place])   CKD (chronic kidney disease) stage 5, GFR less than 15 ml/min (HCC)   Valvular heart disease   Acute respiratory failure with hypoxia (HCC)    In excess of 25 minutes spent during this visit. 50% involved face to face contact with the patient for assessment, counseling and coordination of care.

## 2016-05-18 NOTE — Plan of Care (Signed)
Problem: Food- and Nutrition-Related Knowledge Deficit (NB-1.1) Goal: Nutrition education Formal process to instruct or train a patient/client in a skill or to impart knowledge to help patients/clients voluntarily manage or modify food choices and eating behavior to maintain or improve health. Outcome: Completed/Met Date Met: 05/18/16 Nutrition Education Note  RD consulted for Renal Education. Provided "Eating Healthy with Kidney Disease" handout to patient/family. Reviewed food groups and provided written recommended serving sizes specifically determined for patient's current nutritional status.   Explained why diet restrictions are needed and provided lists of foods to limit/avoid that are high potassium, sodium, and phosphorus. Provided specific recommendations on safer alternatives of these foods. Strongly encouraged compliance of this diet.   Discussed importance of protein intake at each meal and snack. Provided examples of how to maximize protein intake throughout the day. Discussed need for fluid restriction and renal-friendly beverage options. Teach back method used.  Expect good compliance.  Body mass index is 31.29 kg/m. Pt meets criteria for class I obesity based on current BMI.  Current diet order is renal with 1200 ml fluids, patient is consuming approximately 90% of meals at this time. Pt reports having a good appetite however does not like most of her foods at meals. Discussed renal friendly food choices available for pt to order. Labs and medications reviewed. Pt currently has Nepro ordered and would like to continue with them. No further nutrition interventions warranted at this time. RD contact information provided. If additional nutrition issues arise, please re-consult RD.  Corrin Parker, MS, RD, LDN Pager # [phone] After hours/ weekend pager # [phone]

## 2016-05-18 NOTE — Progress Notes (Signed)
Sickle cell night number paged concerning patient's tele reading of wide QRS and frequent PVC's. Patient is assessed and doing well.

## 2016-05-18 NOTE — Progress Notes (Signed)
[facility] ROUNDING NOTE   Subjective:   Interval History:  Not very active not weight bearing due to pain in knee ( originally was in hip)  She is back to baseline in terms of mental status. Appetite is still poor ( does not like hospital food )   Objective:  Vital signs in last 24 hours:  Temp:  [98.3 F (36.8 C)-98.6 F (37 C)] 98.3 F (36.8 C) (12/18 0536) Pulse Rate:  [71-74] 73 (12/18 0536) Resp:  [18] 18 (12/18 0536) BP: (104-121)/(35-46) 104/35 (12/18 0536) SpO2:  [87 %-100 %] 96 % (12/18 0919) Weight:  [85.3 kg (188 lb 0.8 oz)] 85.3 kg (188 lb 0.8 oz) (12/17 2155)  Weight change: 0.7 kg (1 lb 8.7 oz) Filed Weights   05/16/16 0500 05/16/16 2104 05/17/16 2155  Weight: 84.6 kg (186 lb 8.2 oz) 84.6 kg (186 lb 8.2 oz) 85.3 kg (188 lb 0.8 oz)    Intake/Output: I/O last 3 completed shifts: In: 650 [P.O.:600; IV Piggyback:50] Out: 128 [Urine:128]   Intake/Output this shift:  No intake/output data recorded.  Gen awake, alert, conversant No rash, cyanosis or gangrene Sclera anicteric, throat clear slightly dry  Mild JVD Chest clear bilat, no rales or wheezing RRR loud SEM, no RG Abd soft ntnd no mass or ascites +bs  MS Left knee joint effusions  Ext no LE or UEedema /  Neuro alert today, Ox 2, no asterixis   Basic Metabolic Panel:  Recent Labs Lab 05/14/16 1717 05/15/16 0630 05/16/16 1010 05/17/16 1131 05/18/16 0347  NA 144 145 140 139 137  K 5.4* 5.0 4.1 4.5 4.3  CL 112* 110 104 102 98*  CO2 22 24 26 25 27   GLUCOSE 89 92 99 102* 104*  BUN 87* 83* 51* 63* 73*  CREATININE 3.59* 3.71* 2.87* 3.39* 3.80*  CALCIUM 9.8 10.2 9.7 10.0 9.8  PHOS 6.5*  --  5.5* 6.4* 6.4*    Liver Function Tests:  Recent Labs Lab 05/11/16 1523 05/12/16 0235 05/13/16 2343 05/14/16 0234 05/14/16 1717 05/15/16 0630 05/16/16 1010 05/17/16 1131 05/18/16 0347  AST 47* 40 25  --   --  27  --   --   --   ALT 15 12* 11*  --   --  12*  --   --   --   ALKPHOS  150* 142* 148*  --   --  137*  --   --   --   BILITOT 13.0* 14.1* 15.4* 19.2*  --  15.9*  --   --   --   PROT 7.2 7.0 6.7  --   --  6.1*  --   --   --   ALBUMIN 3.4* 3.4* 3.1*  --  2.8* 2.4* 2.4* 2.3* 2.4*   No results for input(s): LIPASE, AMYLASE in the last 168 hours.  Recent Labs Lab 05/14/16 1740  AMMONIA 46*    CBC:  Recent Labs Lab 05/13/16 2343 05/14/16 0243 05/15/16 0630 05/16/16 0830 05/17/16 1131  WBC 14.6* 16.1* 13.0* 12.2* 12.9*  NEUTROABS 12.0*  --  10.8* 9.3* 7.6  HGB 6.1* 6.3* 6.1* 6.1* 5.5*  HCT 17.6* 18.2* 18.5* 18.3* 16.8*  MCV 91.2 92.9 92.0 92.4 90.3  PLT 184 178 177 180 201    Cardiac Enzymes: No results for input(s): CKTOTAL, CKMB, CKMBINDEX, TROPONINI in the last 168 hours.  BNP: Invalid input(s): POCBNP  CBG:  Recent Labs Lab 05/14/16 0317 05/17/16 0835  GLUCAP 107* 106*    Microbiology:  Results for orders placed or performed during the hospital encounter of 05/13/16  Culture, blood (Routine X 2) w Reflex to ID Panel     Status: None (Preliminary result)   Collection Time: 05/14/16  2:35 AM  Result Value Ref Range Status   Specimen Description BLOOD RIGHT ARM  Final   Special Requests IN PEDIATRIC BOTTLE 4CC  Final   Culture   Final    NO GROWTH 3 DAYS Performed at [facility]    Report Status PENDING  Incomplete  Culture, blood (Routine X 2) w Reflex to ID Panel     Status: None (Preliminary result)   Collection Time: 05/14/16  2:40 AM  Result Value Ref Range Status   Specimen Description BLOOD RIGHT HAND  Final   Special Requests BOTTLES DRAWN AEROBIC ONLY 5CC  Final   Culture   Final    NO GROWTH 3 DAYS Performed at [facility]    Report Status PENDING  Incomplete  MRSA PCR Screening     Status: None   Collection Time: 05/14/16  3:57 AM  Result Value Ref Range Status   MRSA by PCR NEGATIVE NEGATIVE Final    Comment:        The GeneXpert MRSA Assay (FDA approved for NASAL specimens only), is one  component of a comprehensive MRSA colonization surveillance program. It is not intended to diagnose MRSA infection nor to guide or monitor treatment for MRSA infections.   Culture, Urine     Status: Abnormal   Collection Time: 05/14/16  4:05 AM  Result Value Ref Range Status   Specimen Description URINE, CLEAN CATCH  Final   Special Requests NONE  Final   Culture 40,000 COLONIES/mL PROTEUS MIRABILIS (A)  Final   Report Status 05/16/2016 FINAL  Final   Organism ID, Bacteria PROTEUS MIRABILIS (A)  Final      Susceptibility   Proteus mirabilis - MIC*    AMPICILLIN <=2 SENSITIVE Sensitive     CEFAZOLIN <=4 SENSITIVE Sensitive     CEFTRIAXONE <=1 SENSITIVE Sensitive     CIPROFLOXACIN >=4 RESISTANT Resistant     GENTAMICIN <=1 SENSITIVE Sensitive     IMIPENEM 1 SENSITIVE Sensitive     NITROFURANTOIN 128 RESISTANT Resistant     TRIMETH/SULFA >=320 RESISTANT Resistant     AMPICILLIN/SULBACTAM <=2 SENSITIVE Sensitive     PIP/TAZO <=4 SENSITIVE Sensitive     * 40,000 COLONIES/mL PROTEUS MIRABILIS    Coagulation Studies: No results for input(s): LABPROT, INR in the last 72 hours.  Urinalysis: No results for input(s): COLORURINE, LABSPEC, PHURINE, GLUCOSEU, HGBUR, BILIRUBINUR, KETONESUR, PROTEINUR, UROBILINOGEN, NITRITE, LEUKOCYTESUR in the last 72 hours.  Invalid input(s): APPERANCEUR    Imaging: No results found.   Medications:    . sodium chloride   Intravenous Once  . amiodarone  100 mg Oral Daily  . calcitRIOL  0.25 mcg Oral Daily  . feeding supplement (NEPRO CARB STEADY)  237 mL Oral BID BM  . fluticasone furoate-vilanterol  1 puff Inhalation Daily  . folic acid  1 mg Oral Daily  . hydroxyurea  500 mg Oral Daily  . morphine  15 mg Oral Once  . pantoprazole  40 mg Oral Daily  . senna-docusate  1 tablet Oral BID  . sodium bicarbonate  650 mg Oral BID  . sodium chloride flush  10-40 mL Intracatheter Q12H   morphine, ondansetron **OR** ondansetron (ZOFRAN) IV,  polyethylene glycol, sodium chloride flush  Assessment/ Plan:  Assessment: 1. CKD5 with  AMS/Asterixus: s/p HD x1 12/15 for suspected uremia.  No asterixus today and AAO x3. Not sure needs dialysis despite advanced renal failure  1. Follows with [facility] 2. At near baseline SCr  2. Sickle cell disease 3. Anemia - Hb 6's, defer transfusion decision to Dr Zigmund Daniel in sickle cell pt 4. COPD 5. Hx CHF - no extra vol on exam 6. Leukocytosis: U Cx with low level bacteruria and B Cx NGTD; On ceftriaxone . May have gout suggest arthrocentesis of   Plan -   Watch for another 24h with labs  Either will be HD dependent and will obtain [facility] or return to baseline; favorable she will not need HD moving forward. She had a fistula that is not working right now     LOS: 5 Iqra Rotundo W @TODAY @9 :24 AM

## 2016-05-19 DIAGNOSIS — G9340 Encephalopathy, unspecified: Secondary | ICD-10-CM

## 2016-05-19 LAB — CBC WITH DIFFERENTIAL/PLATELET
BAND NEUTROPHILS: 1 %
BASOS ABS: 0 10*3/uL (ref 0.0–0.1)
Basophils Relative: 0 %
Blasts: 0 %
EOS ABS: 0 10*3/uL (ref 0.0–0.7)
EOS PCT: 0 %
HCT: 15 % — ABNORMAL LOW (ref 36.0–46.0)
HEMOGLOBIN: 4.9 g/dL — AB (ref 12.0–15.0)
LYMPHS ABS: 1.9 10*3/uL (ref 0.7–4.0)
Lymphocytes Relative: 12 %
MCH: 29.2 pg (ref 26.0–34.0)
MCHC: 32.7 g/dL (ref 30.0–36.0)
MCV: 89.3 fL (ref 78.0–100.0)
METAMYELOCYTES PCT: 3 %
MYELOCYTES: 0 %
Monocytes Absolute: 1.1 10*3/uL — ABNORMAL HIGH (ref 0.1–1.0)
Monocytes Relative: 7 %
Neutro Abs: 13 10*3/uL — ABNORMAL HIGH (ref 1.7–7.7)
Neutrophils Relative %: 77 %
Other: 0 %
PLATELETS: 288 10*3/uL (ref 150–400)
Promyelocytes Absolute: 0 %
RBC: 1.68 MIL/uL — AB (ref 3.87–5.11)
RDW: 26.7 % — AB (ref 11.5–15.5)
WBC: 16 10*3/uL — AB (ref 4.0–10.5)
nRBC: 22 /100 WBC — ABNORMAL HIGH

## 2016-05-19 LAB — SYNOVIAL CELL COUNT + DIFF, W/ CRYSTALS
Lymphocytes-Synovial Fld: 2 % (ref 0–20)
MONOCYTE-MACROPHAGE-SYNOVIAL FLUID: 12 % — AB (ref 50–90)
NEUTROPHIL, SYNOVIAL: 86 % — AB (ref 0–25)
WBC, Synovial: 13440 /mm3 — ABNORMAL HIGH (ref 0–200)

## 2016-05-19 LAB — GRAM STAIN

## 2016-05-19 LAB — PREPARE RBC (CROSSMATCH)

## 2016-05-19 LAB — RETICULOCYTES: RBC.: 1.73 MIL/uL — AB (ref 3.87–5.11)

## 2016-05-19 LAB — CULTURE, BLOOD (ROUTINE X 2)
CULTURE: NO GROWTH
CULTURE: NO GROWTH

## 2016-05-19 MED ORDER — MORPHINE SULFATE 15 MG PO TABS
15.0000 mg | ORAL_TABLET | Freq: Two times a day (BID) | ORAL | Status: DC | PRN
  Administered 2016-05-19 – 2016-05-20 (×2): 15 mg via ORAL
  Filled 2016-05-19 (×2): qty 1

## 2016-05-19 MED ORDER — SODIUM CHLORIDE 0.9 % IV SOLN
510.0000 mg | Freq: Once | INTRAVENOUS | Status: AC
  Administered 2016-05-19: 510 mg via INTRAVENOUS
  Filled 2016-05-19 (×2): qty 17

## 2016-05-19 MED ORDER — SODIUM CHLORIDE 0.9 % IV SOLN
Freq: Once | INTRAVENOUS | Status: AC
  Administered 2016-05-19: 10 mL via INTRAVENOUS

## 2016-05-19 NOTE — Progress Notes (Signed)
SICKLE CELL SERVICE PROGRESS NOTE  MICA VILL [id number] DOB: 1951/04/17 DOA: 05/13/2016 PCP: Tommi Rumps, MD  Assessment/Plan: Principal Problem:   Acute encephalopathy Active Problems:   Left hip pain   Elevated bilirubin   Sickle cell crisis (HCC)   AKI (acute kidney injury) ([place])   CKD (chronic kidney disease) stage 5, GFR less than 15 ml/min (HCC)   Valvular heart disease   Acute respiratory failure with hypoxia ([place])  1. Left Knee Pain:  X-Ray knee shows large effusion. Dr. Erlinda Hong from Orthopedic Surgery to see patietn today for aspiration and evaluation of fluid.Knee joint shows no warmth or erythema which is reassuring. 2. Back Pain: MRI findings noted No further pain in the back  even with movement today. She may need to be evaluated by Neurosurgery or Orthopedic surgery as an out patient. She has not required any further pain medications.  3. Gait Abnormality: Pt was evaluated by PT yesterday and recommendations were made for SNF for rehab. SW to see patient today. Family wants to pursue rehab placement in the [place] area.   4. Anemia of Chronic Disease: Pt had a decrease in Hb down to 4.9 today and is still asymptomatic. However with concern for oxygen carrying capacity in an setting of valvular heart disease and pulmonary HTN, will transfuse 1 unit RBC's. She has a robust reticulocytosis which will likely increase peripheral RBC volume in a few days.A review of her records shows that she had been receiving FEREHEME about every month. I spoke with her Hematologist Dr. Burlene Arnt who recommends giving FERAHEME 510 mg x 1. Will also check LDH. 5. Hypoxia: Saturations 97-100% on RA today.    6. Acute on CKD IV:  Her baseline Cr is 3.8 and baseline BUN 56. BUN and Cr increased mildly. Will defer to Nephrology. 7. Chronic Hemolysis: Pt would benefit from a therapeutic dose of Hydrea to induce a more stable population of Hb however per her records she has a h/o Lichen Planus  associate with Hydrea use. There is no data to support that sub-theapeutic dose. Will D/C Hydrea in light of low Hb. 8. Hb SS without crisis: Pt is not in crisis.She is opiate naive and has been using Aleve for her hip pains. She however has CKD IV and NSAID's are contraindicated. Continue Folic Acid.  9. Hyperkalemia: Resolved.  10. Elevated BNP: Uninterpretable in a setting of CKD and Cardiac Valvular disease. 11. UTI: Improved: U/A findings consistent with UTI . On day #3 of Rocephin. Urine culture shows 40,000 colonies of Proteus Mirabilis which is sensitive to Rocephin. Pt has completed 3 days of therapy. Antibiotics discontinued yesterday. 12. Leukocytosis: WBC elevated which may just be a reflection of bone marrow activity. However in light of effusion may also reflect an infection. No other evidence of infection.  13. Gait abnormality and weakness:  PT and OT input appreciated. 14. Constipation:  Continue Senna-S. Pt received lactulose and Miralax still without any results. If no results by tomorrow, will give SMOG enema.   15. Valvular Heart Disease with Mild decrease in Systolic Function: Continue Pacerone and Lasix held as patient euvolemic.  2-ECHO shows stable findings since last ECHO.  16. 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  18. Lichen Planus:Please note that nursing documented stage II pressure ulcer on RLE. However this is not a pressure ulcer but rather healed Lichen Planus lesions 19. Chronic pain: Pt usually takes Aleve for chronic pain in her hips despite CKD  and being advised against taking this.    Code Status: Full Code Family Communication: Husband at bedside and updated. Disposition Plan: Not yet ready for discharge.   MATTHEWS,MICHELLE A.  Pager [phone]. If 7PM-7AM, please contact night-coverage.  05/19/2016, 11:29 AM  LOS: 6 days   Interim History: This is an opiate naive patient with Hb ss, CKD IV,  Aortic and A-V Valvular disease, CHF, Idiopathic Pulmonary HTN and OSA who was admitted to [facility] with hemolytic anemia and sickle cell crisis. Per her husband and confirmed by patient, her pain began when she had a car ride to their home in [place]. She was seen in the ED as the pain was excruciating and she was unable to tolerate it. She received "a shot" and was discharged with a prescription for Morphine 15 mg. She states that she took about 4 doses but had about 3 episodes of emesis which she attributes to the Morphine. However their is not a strong temporal relationship between Morphine and emesis. She did have significant itching with the Morphine. She was seen by her Hematologist in the office and found to have a Hb of 5.0 g/dL so was sent to the hospital for further evaluation.  At [facility], she was transfused 2 units RBC's for a 5.0 g/dL which improved to 7.6 g/dL after transfusion. Today her Hb is 6.1 g/dL. Per her records, her baseline Hb is  She normally takes . However her LDH is down to 646 from 901.  She also has chronically elevated bilirubin likely due to chronic hemolysis. She reports that her dry weight is 167 #'s and she takes Lasix 80 mg daily expect when her weight is increased she takes it BID. He last lasix was 6 days ago and her weight today is 192 #'s. However a review of office visits in the last year show weights of 194 3's.  Her clinical course was consistennt with Uremia and she was seen by Nephrology and transferred to [facility] for dialysis. Pt is much more alert and well appearing today. She reports that pain in knee improved and minimal after heat to knee by PT.  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 I have also reviewed and a TEE done by Dr. 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 However per her husband, it was felt that surgery would be a greater  risk to the patient given her overall medical condition. Review of her Hb electrophoresis from [facility] is consistent with Hb SS in a transfused patient. She also has a h/o idiopathic pul;monary HTN and she a smoking history but is not a current smoker. However has a Dx of COPD and is on Ellipta.  Review of labs shows previous BNP of 911. BNP to be 680 and as high as 901 in the past. Also spoke with Dr. Burlene Arnt (Pt's Hematologist) and reviewed MRI with Dr. Clearnce Hasten.   Consultants:  Nephrology- Dr. Justin Mend  Dr. Burlene Arnt- Hematology (Phone)  Dr. Kathyrn Sheriff- Neurosurgery (Phone)  Dr. Erlinda Hong- Orthopedic Surgery  Procedures:  Dialysis- 05/15/2016  Arthrocentesis 05/19/2016  Antibiotics:  Rocephin 12/14 >>12/17   Objective: Vitals:   05/18/16 2142 05/19/16 0013 05/19/16 0400 05/19/16 0925  BP: (!) 107/58 (!) 110/36 118/63 (!) 113/55  Pulse: 74 80 98 79  Resp: 18 18 18 16   Temp: 98.2 F (36.8 C) 98.4 F (36.9 C) 98.4 F (36.9 C) 98  F (36.7 C)  TempSrc: Oral Oral Oral Oral  SpO2: 97% 90% 93% 97%  Weight: 83.3 kg (183 lb 10.3 oz)     Height:       Weight change: -2 kg (-4 lb 6.6 oz)  Intake/Output Summary (Last 24 hours) at 05/19/16 1129 Last data filed at 05/19/16 [id number]  Gross per 24 hour  Intake              680 ml  Output              625 ml  Net               55 ml    General: Alert, awake, oriented x3, in no apparent distress today. Well appearing. Moving well in the bed. HEENT: [place]/AT PEERL, Anicteric. Pt also has muddy sclera. Mild icterus. Neck: Trachea midline,  no masses, no thyromegal,y no JVD, no carotid bruit OROPHARYNX:  Moist, No exudate/ erythema/lesions.  Heart: Regular rate and rhythm, without rubs or gallops. She has SEM II/VI at 2nd intercostal space.  PMI non-displaced, no heaves or thrills on palpation.  Lungs: Clear to auscultation but decreased breath sound at bases. No wheezing or rhonchi noted. No increased vocal fremitus resonant to percussion   Abdomen: Soft, nontender, nondistended, positive bowel sounds, no masses no hepatosplenomegaly noted.  Neuro: No focal neurological deficits noted cranial nerves II through XII grossly intact.  Strength at baseline  in bilateral upper. Pt able to move BLE's  Though partial range against gravity. Musculoskeletal: No warmth  or erythema noted around joints, but there is swelling noted at the left knee. No spinal tenderness noted today. Psychiatric: Patient alert and oriented x3, good insight and cognition, good recent to remote recall.    Data Reviewed: Basic Metabolic Panel:  Recent Labs Lab 05/14/16 1717 05/15/16 0630 05/16/16 1010 05/17/16 1131 05/18/16 0347  NA 144 145 140 139 137  K 5.4* 5.0 4.1 4.5 4.3  CL 112* 110 104 102 98*  CO2 22 24 26 25 27   GLUCOSE 89 92 99 102* 104*  BUN 87* 83* 51* 63* 73*  CREATININE 3.59* 3.71* 2.87* 3.39* 3.80*  CALCIUM 9.8 10.2 9.7 10.0 9.8  PHOS 6.5*  --  5.5* 6.4* 6.4*   Liver Function Tests:  Recent Labs Lab 05/13/16 2343 05/14/16 0234 05/14/16 1717 05/15/16 0630 05/16/16 1010 05/17/16 1131 05/18/16 0347  AST 25  --   --  27  --   --   --   ALT 11*  --   --  12*  --   --   --   ALKPHOS 148*  --   --  137*  --   --   --   BILITOT 15.4* 19.2*  --  15.9*  --   --   --   PROT 6.7  --   --  6.1*  --   --   --   ALBUMIN 3.1*  --  2.8* 2.4* 2.4* 2.3* 2.4*   No results for input(s): LIPASE, AMYLASE in the last 168 hours.  Recent Labs Lab 05/14/16 1740  AMMONIA 46*   CBC:  Recent Labs Lab 05/13/16 2343 05/14/16 0243 05/15/16 0630 05/16/16 0830 05/17/16 1131 05/19/16 0418  WBC 14.6* 16.1* 13.0* 12.2* 12.9* 16.0*  NEUTROABS 12.0*  --  10.8* 9.3* 7.6 13.0*  HGB 6.1* 6.3* 6.1* 6.1* 5.5* 4.9*  HCT 17.6* 18.2* 18.5* 18.3* 16.8* 15.0*  MCV 91.2 92.9 92.0 92.4 90.3 89.3  PLT 184 178  177 180 201 288   Cardiac Enzymes: No results for input(s): CKTOTAL, CKMB, CKMBINDEX, TROPONINI in the last 168 hours. BNP (last 3  results)  Recent Labs  06/05/15 0407 08/28/15 1216 05/13/16 2343  BNP 683.0* 697.0* 911.7*    ProBNP (last 3 results) No results for input(s): PROBNP in the last 8760 hours.  CBG:  Recent Labs Lab 05/14/16 0317 05/17/16 0835  GLUCAP 107* 106*    Recent Results (from the past 240 hour(s))  Culture, blood (Routine X 2) w Reflex to ID Panel     Status: None (Preliminary result)   Collection Time: 05/14/16  2:35 AM  Result Value Ref Range Status   Specimen Description BLOOD RIGHT ARM  Final   Special Requests IN PEDIATRIC BOTTLE 4CC  Final   Culture   Final    NO GROWTH 4 DAYS Performed at [facility]    Report Status PENDING  Incomplete  Culture, blood (Routine X 2) w Reflex to ID Panel     Status: None (Preliminary result)   Collection Time: 05/14/16  2:40 AM  Result Value Ref Range Status   Specimen Description BLOOD RIGHT HAND  Final   Special Requests BOTTLES DRAWN AEROBIC ONLY 5CC  Final   Culture   Final    NO GROWTH 4 DAYS Performed at [facility]    Report Status PENDING  Incomplete  MRSA PCR Screening     Status: None   Collection Time: 05/14/16  3:57 AM  Result Value Ref Range Status   MRSA by PCR NEGATIVE NEGATIVE Final    Comment:        The GeneXpert MRSA Assay (FDA approved for NASAL specimens only), is one component of a comprehensive MRSA colonization surveillance program. It is not intended to diagnose MRSA infection nor to guide or monitor treatment for MRSA infections.   Culture, Urine     Status: Abnormal   Collection Time: 05/14/16  4:05 AM  Result Value Ref Range Status   Specimen Description URINE, CLEAN CATCH  Final   Special Requests NONE  Final   Culture 40,000 COLONIES/mL PROTEUS MIRABILIS (A)  Final   Report Status 05/16/2016 FINAL  Final   Organism ID, Bacteria PROTEUS MIRABILIS (A)  Final      Susceptibility   Proteus mirabilis - MIC*    AMPICILLIN <=2 SENSITIVE Sensitive     CEFAZOLIN <=4 SENSITIVE  Sensitive     CEFTRIAXONE <=1 SENSITIVE Sensitive     CIPROFLOXACIN >=4 RESISTANT Resistant     GENTAMICIN <=1 SENSITIVE Sensitive     IMIPENEM 1 SENSITIVE Sensitive     NITROFURANTOIN 128 RESISTANT Resistant     TRIMETH/SULFA >=320 RESISTANT Resistant     AMPICILLIN/SULBACTAM <=2 SENSITIVE Sensitive     PIP/TAZO <=4 SENSITIVE Sensitive     * 40,000 COLONIES/mL PROTEUS MIRABILIS     Studies: Ct Abdomen Pelvis Wo Contrast  Result Date: 05/14/2016 CLINICAL DATA:  65 y/o F; decreasing hemoglobin with concern for retroperitoneal hemorrhage or hemorrhage at the painful left hip. History of sickle cell disease and cholecystectomy. EXAM: CT ABDOMEN AND PELVIS WITHOUT CONTRAST TECHNIQUE: Multidetector CT imaging of the abdomen and pelvis was performed following the standard protocol without IV contrast. COMPARISON:  05/17/2015 CT abdomen and pelvis. FINDINGS: Lower chest: Severe cardiomegaly. Decreased attenuation of cardiac blood pool compatible with anemia. Hepatobiliary: No focal liver abnormality is seen. Status post cholecystectomy. No biliary dilatation. Pancreas: Unremarkable. No pancreatic ductal dilatation or surrounding inflammatory changes. Spleen: Status  post splenectomy. Adrenals/Urinary Tract: Adrenal glands are unremarkable. Stable left kidney interpolar 26 mm cyst. Kidneys are otherwise normal, without renal calculi, focal lesion, or hydronephrosis. Bladder is unremarkable. Stomach/Bowel: Stomach is within normal limits. Appendix appears normal. No evidence of bowel wall thickening, distention, or inflammatory changes. Few scattered diverticulum of the colon. Vascular/Lymphatic: Aortic atherosclerosis with moderate calcifications. No enlarged abdominal or pelvic lymph nodes. Reproductive: Uterus and bilateral adnexa are unremarkable. Other: No abdominal wall hernia or abnormality. No abdominopelvic ascites. Musculoskeletal: Mixed lucent and sclerotic foci in the right femoral head, left  proximal femur, bilateral ilium, and small lucencies in vertebral bodies probably representing multiple bone infarcts given history of sickle cell disease. IMPRESSION: 1. No hemorrhage identified. 2. Severe cardiomegaly. 3. Aortic atherosclerosis. 4. Bony stigmata of sickle cell disease with interval right femoral head avascular necrosis. Electronically Signed   By: Kristine Garbe M.D.   On: 05/14/2016 05:18   Dg Chest 1 View  Result Date: 05/13/2016 CLINICAL DATA:  Hypoxia, history sickle cell anemia, COPD EXAM: CHEST 1 VIEW COMPARISON:  Portable exam 1550 hours compared 06/22/2015 FINDINGS: LEFT jugular Port-A-Cath with tip projecting over SVC. Enlargement of cardiac silhouette with pulmonary vascular congestion. Aortic atherosclerosis. Lungs grossly clear. No definite infiltrate, pleural effusion or pneumothorax. No acute osseous findings. IMPRESSION: Enlargement of cardiac silhouette with pulmonary vascular congestion. No acute abnormalities. Electronically Signed   By: Lavonia Dana M.D.   On: 05/13/2016 16:05   Dg Knee 1-2 Views Left  Result Date: 05/18/2016 CLINICAL DATA:  Left knee pain. EXAM: LEFT KNEE - 1-2 VIEW COMPARISON:  None. FINDINGS: No acute fracture or dislocation. Osteochondral lesion involving the patella. Large joint effusion. Severe osteopenia. IMPRESSION: Osteochondral lesion of the patella.  Large joint effusion. Electronically Signed   By: Kathreen Devoid   On: 05/18/2016 11:15   Ct Head Wo Contrast  Result Date: 05/14/2016 CLINICAL DATA:  65 year old female with acute encephalopathy. The patient is lethargic, disoriented, and generalized weakness. EXAM: CT HEAD WITHOUT CONTRAST TECHNIQUE: Contiguous axial images were obtained from the base of the skull through the vertex without intravenous contrast. COMPARISON:  None. FINDINGS: Brain: The ventricles and sulci are appropriate in size for patient's age. Mild periventricular and deep white matter chronic microvascular  ischemic changes noted. There is no acute intracranial hemorrhage. No mass effect or midline shift noted. Vascular: No hyperdense vessel or unexpected calcification. Skull: There is diffuse ground-glass appearance of the skull with "Salt and pepper" appearance may be related to underlying hyperparathyroidism. Multiple lucent lesions within the skull may represent brown tumor of the bone secondary to hyperparathyroidism. Correlation with clinical exam and parathyroid function tests recommended. Sinuses/Orbits: No acute finding. Other: None IMPRESSION: No acute intracranial pathology. Mild age-related atrophy and chronic microvascular ischemic changes. Ground-glass appearance of the skull suggestive of hyperparathyroidism. Correlation with clinical exam and blood calcium levels recommended. Electronically Signed   By: Anner Crete M.D.   On: 05/14/2016 05:17   Mr Lumbar Spine Wo Contrast  Result Date: 05/14/2016 CLINICAL DATA:  Back pain at L4-5.  History of sickle cell anemia. EXAM: MRI LUMBAR SPINE WITHOUT CONTRAST TECHNIQUE: Multiplanar, multisequence MR imaging of the lumbar spine was performed. No intravenous contrast was administered. Patient was unable to complete examination due to pain, axial T1 sequence not obtained. COMPARISON:  CT abdomen and pelvis May 14, 2016 at 0432 hours FINDINGS: Mild motion degraded examination. SEGMENTATION: For the purposes of this report, the last well-formed intervertebral disc will be described as L5-S1. ALIGNMENT: Maintenance  of the lumbar lordosis. Minimal grade 1 L4-5 anterolisthesis without spondylolysis. VERTEBRAE:Markedly decreased bone marrow signal on all sequences compatible with bone marrow replacement/reactivation. Vertebral bodies are intact. Mild L4-5 disc height loss, with slight desiccation wall this suspected. Multilevel mild chronic discogenic endplate changes. Focal bright T1 and bright T2 signal in the LEFT ventral L5 vertebral body most  compatible with hemangioma. CONUS MEDULLARIS: Conus medullaris terminates at L1-2 and demonstrates normal morphology and signal characteristics. Limited assessment of cauda equina due to mild patient motion and lack of T1 sequence. PARASPINAL AND SOFT TISSUES: Included prevertebral and paraspinal soft tissues are nonsuspicious, moderate symmetric paraspinal muscle atrophy. Renal cysts better seen on today's CT abdomen and pelvis. DISC LEVELS: L1-2 and L2-3: Potential annular fissures without significant disc bulge, canal stenosis or neural foraminal narrowing. L3-4: Small LEFT extraforaminal disc protrusion could affect the exited LEFT L4 nerve. Mild facet arthropathy and ligamentum flavum redundancy. Mild RIGHT, mild to moderate LEFT neural foraminal narrowing. L4-5: Widened facets to 5 mm with RIGHT greater than LEFT moderate facet arthropathy. Moderate broad-based disc bulge asymmetric to the RIGHT encroaches upon the exited RIGHT L4 nerve. Mild canal stenosis. Moderate to severe RIGHT, mild LEFT neural foraminal narrowing. L5-S1: Small broad-based disc bulge. Mild facet arthropathy and ligamentum flavum redundancy without canal stenosis. Mild RIGHT neural foraminal narrowing. IMPRESSION: Abnormally decreased bone marrow signal attributed to patient's sickle cell anemia though can be seen with other myeloproliferative disorders. Moderate L4-5 facet arthropathy, widened facets associated inflammatory changes and dynamic instability. Minimal grade 1 L4-5 anterolisthesis. This would be better characterized on flexion extension radiographs. Mild canal stenosis L4-5. Neural foraminal narrowing L3-4 through L5-S1: Moderate to severe at L4-5. Electronically Signed   By: Elon Alas M.D.   On: 05/14/2016 22:42   Dg Hip Unilat With Pelvis 2-3 Views Left  Result Date: 05/11/2016 CLINICAL DATA:  Hip pain. EXAM: DG HIP (WITH OR WITHOUT PELVIS) 2-3V LEFT COMPARISON:  None. FINDINGS: There is no evidence of hip  fracture or dislocation. There is no evidence of arthropathy or other focal bone abnormality. IMPRESSION: Normal left hip. Electronically Signed   By: Marijo Conception, M.D.   On: 05/11/2016 15:59    Scheduled Meds: . sodium chloride   Intravenous Once  . sodium chloride   Intravenous Once  . amiodarone  100 mg Oral Daily  . calcitRIOL  0.25 mcg Oral Daily  . feeding supplement (NEPRO CARB STEADY)  237 mL Oral BID BM  . fluticasone furoate-vilanterol  1 puff Inhalation Daily  . folic acid  1 mg Oral Daily  . morphine  15 mg Oral Once  . pantoprazole  40 mg Oral Daily  . senna-docusate  1 tablet Oral BID  . sodium bicarbonate  650 mg Oral BID  . sodium chloride flush  10-40 mL Intracatheter Q12H   Continuous Infusions:  Principal Problem:   Acute encephalopathy Active Problems:   Left hip pain   Elevated bilirubin   Sickle cell crisis (HCC)   AKI (acute kidney injury) ([place])   CKD (chronic kidney disease) stage 5, GFR less than 15 ml/min (HCC)   Valvular heart disease   Acute respiratory failure with hypoxia (HCC)    In excess of 40 minutes spent during this visit. 50% involved face to face contact with the patient for assessment, counseling and coordination of care.

## 2016-05-19 NOTE — Consult Note (Addendum)
ORTHOPAEDIC CONSULTATION  REQUESTING PHYSICIAN: Leana Gamer, MD  Chief Complaint: left knee pain and swelling  HPI: Jane Short is a 65 y.o. female who presents with left knee pain and swelling for a couple of days.  Denies any f/c/n/v.  Does have h/o gout in big toe, never in left knee.  Pain is moderate and worse with movement and weight bearing.  Pain doesn't radiate, it throbs constantly.  Denies any recent trauma or infections.  Past Medical History:  Diagnosis Date  . Anemia   . 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 Cheree Ditto at [facility]  . CHF (congestive heart failure) ([place])   . Congestive heart failure (CHF) ([place])   . Congestive heart failure (CHF) ([place]) 06/2015  . COPD (chronic obstructive pulmonary disease) (HCC)    symptoms Dr. Patricia Pesa   . Gout   . Hypertension   . Lichen planus   . Osteoporosis 2011   osteopenia  . SCA-1 (spinocerebellar ataxia type 1) ([place])   . Sickle cell anemia (HCC)    sickle cell disease  . Sickle cell anemia (HCC)   . Thrombocytopenia ([place])   . Vitamin D deficiency    Past Surgical History:  Procedure Laterality Date  . AV FISTULA PLACEMENT Left 02/27/2016   Procedure: INSERTION OF ARTERIOVENOUS (AV) GORE-TEX GRAFT ARM ( FOREARM LOOP );  Surgeon: Algernon Huxley, MD;  Location: [facility] ORS;  Service: Vascular;  Laterality: Left;  . CHOLECYSTECTOMY    . COLONOSCOPY  2014   [facility]  . EYE SURGERY     cataract bilateral  . GALLBLADDER SURGERY    . PERIPHERAL VASCULAR CATHETERIZATION Left 06/14/2015   Procedure: Dialysis/Perma Catheter Insertion;  Surgeon: Katha Cabal, MD;  Location: [place] [facility];  Service: Cardiovascular;  Laterality: Left;  . PERIPHERAL VASCULAR CATHETERIZATION N/A 01/23/2016   Procedure: Glori Luis Cath Insertion;  Surgeon: Algernon Huxley, MD;  Location: [place] [facility];  Service: Cardiovascular;  Laterality: N/A;  . PERIPHERAL VASCULAR CATHETERIZATION Left 03/30/2016   Procedure: A/V  Shuntogram/Fistulagram;  Surgeon: Algernon Huxley, MD;  Location: [place] [facility];  Service: Cardiovascular;  Laterality: Left;  . TUBAL LIGATION     Social History   Social History  . Marital status: Married    Spouse name: N/A  . Number of children: 1  . Years of education: N/A   Occupational History  .  [employer] account executive   Social History Main Topics  . Smoking status: Former Smoker    Packs/day: 0.25    Years: 30.00    Types: Cigarettes    Quit date: 04/29/2015  . Smokeless tobacco: Never Used     Comment: quit 1 month ago  . Alcohol use No  . Drug use: No     Comment: reports using e-cigarettes occasionally  . Sexual activity: Not Asked   Other Topics Concern  . None   Social History Narrative   Regular Exercise -  NO   Daily Caffeine Use:  1 cup coffee   Lives with her sister.   Working as a Building control surveyor in [employer]         Family History  Problem Relation Age of Onset  . Heart disease Father   . Diabetes Father   . Diabetes Sister   . Cancer Mother     breast cancer  . Cancer Maternal Aunt     Breast cancer   -  negative except otherwise stated in the family history section Allergies  Allergen Reactions  . Ramipril     cough  . Septra [Sulfamethoxazole-Trimethoprim]     Joint pain   Prior to Admission medications   Medication Sig Start Date End Date Taking? Authorizing Provider  amiodarone (PACERONE) 100 MG tablet Take 100 mg by mouth daily.   Yes Historical Provider, MD  calcitRIOL (ROCALTROL) 0.25 MCG capsule Take 0.25 mcg by mouth daily.   Yes Historical Provider, MD  epoetin alfa (EPOGEN,PROCRIT) 16109 UNIT/ML injection Inject into the skin 3 (three) times a week.   Yes Historical Provider, MD  fluticasone furoate-vilanterol (BREO ELLIPTA) 200-25 MCG/INH AEPB Inhale 1 puff into the lungs daily. 02/27/16  Yes Flora Lipps, MD  folic acid (FOLVITE) 1 MG tablet Take 1 mg by mouth daily.   Yes Historical Provider,  MD  furosemide (LASIX) 80 MG tablet Take 80 mg by mouth 2 (two) times daily. 03/16/16  Yes Historical Provider, MD  hydroxyurea (HYDREA) 500 MG capsule Take 1 capsule (500 mg total) by mouth daily. May take with food to minimize GI side effects. 10/10/15  Yes Cammie Sickle, MD  OXYGEN Inhale into the lungs.   Yes Historical Provider, MD  sodium bicarbonate 650 MG tablet Take 650 mg by mouth 2 (two) times daily.    Yes Historical Provider, MD   Dg Knee 1-2 Views Left  Result Date: 05/18/2016 CLINICAL DATA:  Left knee pain. EXAM: LEFT KNEE - 1-2 VIEW COMPARISON:  None. FINDINGS: No acute fracture or dislocation. Osteochondral lesion involving the patella. Large joint effusion. Severe osteopenia. IMPRESSION: Osteochondral lesion of the patella.  Large joint effusion. Electronically Signed   By: Kathreen Devoid   On: 05/18/2016 11:15   - pertinent xrays, CT, MRI studies were reviewed and independently interpreted  Positive ROS: All other systems have been reviewed and were otherwise negative with the exception of those mentioned in the HPI and as above.  Physical Exam: General: Alert, no acute distress Cardiovascular: No pedal edema Respiratory: No cyanosis, no use of accessory musculature GI: No organomegaly, abdomen is soft and non-tender Skin: No lesions in the area of chief complaint Neurologic: Sensation intact distally Psychiatric: Patient is competent for consent with normal mood and affect Lymphatic: No axillary or cervical lymphadenopathy  MUSCULOSKELETAL:  - large knee effusion - no cellulitis - has pretty good mid range motion with worse pain at extremes  Assessment: Left knee effusion  Plan: - aspiration performed at bedside, 50 cc murky fluid with crystals  - will send fluid for labs  Thank you for the consult and the opportunity to see Jane Short. Eduard Roux, MD [place] 11:51 AM

## 2016-05-19 NOTE — Progress Notes (Signed)
[facility] ROUNDING NOTE   Subjective:   Interval History:  Still complaining knee pain. Labs pending   Noted Hb 5 .5   Discussed case with Dr Zigmund Daniel   Objective:  Vital signs in last 24 hours:  Temp:  [97.2 F (36.2 C)-98.4 F (36.9 C)] 98.4 F (36.9 C) (12/19 0400) Pulse Rate:  [67-98] 98 (12/19 0400) Resp:  [18] 18 (12/19 0400) BP: (106-118)/(36-63) 118/63 (12/19 0400) SpO2:  [87 %-97 %] 93 % (12/19 0400) Weight:  [83.3 kg (183 lb 10.3 oz)] 83.3 kg (183 lb 10.3 oz) (12/18 2142)  Weight change: -2 kg (-4 lb 6.6 oz) Filed Weights   05/16/16 2104 05/17/16 2155 05/18/16 2142  Weight: 84.6 kg (186 lb 8.2 oz) 85.3 kg (188 lb 0.8 oz) 83.3 kg (183 lb 10.3 oz)    Intake/Output: I/O last 3 completed shifts: In: 660 [P.O.:660] Out: 650 [Urine:650]   Intake/Output this shift:  No intake/output data recorded.  CVS- RRR RS- CTA ABD- BS present soft non-distended EXT- no edema   Basic Metabolic Panel:  Recent Labs Lab 05/14/16 1717 05/15/16 0630 05/16/16 1010 05/17/16 1131 05/18/16 0347  NA 144 145 140 139 137  K 5.4* 5.0 4.1 4.5 4.3  CL 112* 110 104 102 98*  CO2 22 24 26 25 27   GLUCOSE 89 92 99 102* 104*  BUN 87* 83* 51* 63* 73*  CREATININE 3.59* 3.71* 2.87* 3.39* 3.80*  CALCIUM 9.8 10.2 9.7 10.0 9.8  PHOS 6.5*  --  5.5* 6.4* 6.4*    Liver Function Tests:  Recent Labs Lab 05/13/16 2343 05/14/16 0234 05/14/16 1717 05/15/16 0630 05/16/16 1010 05/17/16 1131 05/18/16 0347  AST 25  --   --  27  --   --   --   ALT 11*  --   --  12*  --   --   --   ALKPHOS 148*  --   --  137*  --   --   --   BILITOT 15.4* 19.2*  --  15.9*  --   --   --   PROT 6.7  --   --  6.1*  --   --   --   ALBUMIN 3.1*  --  2.8* 2.4* 2.4* 2.3* 2.4*   No results for input(s): LIPASE, AMYLASE in the last 168 hours.  Recent Labs Lab 05/14/16 1740  AMMONIA 46*    CBC:  Recent Labs Lab 05/13/16 2343 05/14/16 0243 05/15/16 0630 05/16/16 0830 05/17/16 1131  05/19/16 0418  WBC 14.6* 16.1* 13.0* 12.2* 12.9* 16.0*  NEUTROABS 12.0*  --  10.8* 9.3* 7.6 13.0*  HGB 6.1* 6.3* 6.1* 6.1* 5.5* 4.9*  HCT 17.6* 18.2* 18.5* 18.3* 16.8* 15.0*  MCV 91.2 92.9 92.0 92.4 90.3 89.3  PLT 184 178 177 180 201 288    Cardiac Enzymes: No results for input(s): CKTOTAL, CKMB, CKMBINDEX, TROPONINI in the last 168 hours.  BNP: Invalid input(s): POCBNP  CBG:  Recent Labs Lab 05/14/16 0317 05/17/16 0835  GLUCAP 107* 106*    Microbiology: Results for orders placed or performed during the hospital encounter of 05/13/16  Culture, blood (Routine X 2) w Reflex to ID Panel     Status: None (Preliminary result)   Collection Time: 05/14/16  2:35 AM  Result Value Ref Range Status   Specimen Description BLOOD RIGHT ARM  Final   Special Requests IN PEDIATRIC BOTTLE 4CC  Final   Culture   Final    NO GROWTH 4  DAYS Performed at [facility]    Report Status PENDING  Incomplete  Culture, blood (Routine X 2) w Reflex to ID Panel     Status: None (Preliminary result)   Collection Time: 05/14/16  2:40 AM  Result Value Ref Range Status   Specimen Description BLOOD RIGHT HAND  Final   Special Requests BOTTLES DRAWN AEROBIC ONLY 5CC  Final   Culture   Final    NO GROWTH 4 DAYS Performed at [facility]    Report Status PENDING  Incomplete  MRSA PCR Screening     Status: None   Collection Time: 05/14/16  3:57 AM  Result Value Ref Range Status   MRSA by PCR NEGATIVE NEGATIVE Final    Comment:        The GeneXpert MRSA Assay (FDA approved for NASAL specimens only), is one component of a comprehensive MRSA colonization surveillance program. It is not intended to diagnose MRSA infection nor to guide or monitor treatment for MRSA infections.   Culture, Urine     Status: Abnormal   Collection Time: 05/14/16  4:05 AM  Result Value Ref Range Status   Specimen Description URINE, CLEAN CATCH  Final   Special Requests NONE  Final   Culture 40,000  COLONIES/mL PROTEUS MIRABILIS (A)  Final   Report Status 05/16/2016 FINAL  Final   Organism ID, Bacteria PROTEUS MIRABILIS (A)  Final      Susceptibility   Proteus mirabilis - MIC*    AMPICILLIN <=2 SENSITIVE Sensitive     CEFAZOLIN <=4 SENSITIVE Sensitive     CEFTRIAXONE <=1 SENSITIVE Sensitive     CIPROFLOXACIN >=4 RESISTANT Resistant     GENTAMICIN <=1 SENSITIVE Sensitive     IMIPENEM 1 SENSITIVE Sensitive     NITROFURANTOIN 128 RESISTANT Resistant     TRIMETH/SULFA >=320 RESISTANT Resistant     AMPICILLIN/SULBACTAM <=2 SENSITIVE Sensitive     PIP/TAZO <=4 SENSITIVE Sensitive     * 40,000 COLONIES/mL PROTEUS MIRABILIS    Coagulation Studies: No results for input(s): LABPROT, INR in the last 72 hours.  Urinalysis: No results for input(s): COLORURINE, LABSPEC, PHURINE, GLUCOSEU, HGBUR, BILIRUBINUR, KETONESUR, PROTEINUR, UROBILINOGEN, NITRITE, LEUKOCYTESUR in the last 72 hours.  Invalid input(s): APPERANCEUR    Imaging: Dg Knee 1-2 Views Left  Result Date: 05/18/2016 CLINICAL DATA:  Left knee pain. EXAM: LEFT KNEE - 1-2 VIEW COMPARISON:  None. FINDINGS: No acute fracture or dislocation. Osteochondral lesion involving the patella. Large joint effusion. Severe osteopenia. IMPRESSION: Osteochondral lesion of the patella.  Large joint effusion. Electronically Signed   By: Kathreen Devoid   On: 05/18/2016 11:15     Medications:    . sodium chloride   Intravenous Once  . amiodarone  100 mg Oral Daily  . calcitRIOL  0.25 mcg Oral Daily  . feeding supplement (NEPRO CARB STEADY)  237 mL Oral BID BM  . fluticasone furoate-vilanterol  1 puff Inhalation Daily  . folic acid  1 mg Oral Daily  . hydroxyurea  500 mg Oral Daily  . morphine  15 mg Oral Once  . pantoprazole  40 mg Oral Daily  . senna-docusate  1 tablet Oral BID  . sodium bicarbonate  650 mg Oral BID  . sodium chloride flush  10-40 mL Intracatheter Q12H   morphine, ondansetron **OR** ondansetron (ZOFRAN) IV, polyethylene  glycol, sodium chloride flush  Assessment/ Plan:  Assessment: 1. CKD5 with AMS/Asterixus: s/p HD x1 12/15 for suspected uremia. No asterixus today and AAO x3.  Not sure needs dialysis despite advanced renal failure  1. Follows with [facility] 2. At near baseline SCr  2. Sickle cell disease 3. Anemia - Hb 5's, defer transfusion decision to Dr Zigmund Daniel in sickle cell pt 4. COPD 5. Hx CHF - no extra vol on exam 6. Leukocytosis: U Cx with low level bacteruria and B Cx NGTD; On ceftriaxone . May have gout suggest arthrocentesis of   Plan -  Watch for another 24h with labs  Either will be HD dependent and will obtain [facility] or return to baseline; favorable she will not need HD moving forward. She had a fistula that is not working right now  Discussed with husband  - the knee pain seems like the biggest complaint and shall defer this to Dr Zigmund Daniel    LOS: 6 [place] Pfost W @TODAY @8 :55 AM

## 2016-05-19 NOTE — Progress Notes (Signed)
Knee aspiration is consistent with gout attack.  Consider IV or PO steroids for gout attack as appropriate by primary team.  May resume previous diet.  No surgery necessary.  Ortho signed off.  Azucena Cecil, MD [place] 4:02 PM

## 2016-05-20 ENCOUNTER — Inpatient Hospital Stay: Payer: Medicare Other

## 2016-05-20 DIAGNOSIS — R269 Unspecified abnormalities of gait and mobility: Secondary | ICD-10-CM

## 2016-05-20 DIAGNOSIS — M10362 Gout due to renal impairment, left knee: Secondary | ICD-10-CM

## 2016-05-20 DIAGNOSIS — M48061 Spinal stenosis, lumbar region without neurogenic claudication: Secondary | ICD-10-CM | POA: Diagnosis present

## 2016-05-20 LAB — RENAL FUNCTION PANEL
ANION GAP: 10 (ref 5–15)
Albumin: 2.3 g/dL — ABNORMAL LOW (ref 3.5–5.0)
BUN: 68 mg/dL — ABNORMAL HIGH (ref 6–20)
CO2: 26 mmol/L (ref 22–32)
Calcium: 10.3 mg/dL (ref 8.9–10.3)
Chloride: 100 mmol/L — ABNORMAL LOW (ref 101–111)
Creatinine, Ser: 3.42 mg/dL — ABNORMAL HIGH (ref 0.44–1.00)
GFR calc non Af Amer: 13 mL/min — ABNORMAL LOW (ref >60)
GFR, EST AFRICAN AMERICAN: 15 mL/min — AB (ref >60)
GLUCOSE: 79 mg/dL (ref 65–99)
POTASSIUM: 4.2 mmol/L (ref 3.5–5.1)
Phosphorus: 4.4 mg/dL (ref 2.5–4.6)
SODIUM: 136 mmol/L (ref 135–145)

## 2016-05-20 LAB — CBC WITH DIFFERENTIAL/PLATELET
BAND NEUTROPHILS: 0 %
BASOS PCT: 0 %
BLASTS: 0 %
Basophils Absolute: 0 10*3/uL (ref 0.0–0.1)
Eosinophils Absolute: 0 10*3/uL (ref 0.0–0.7)
Eosinophils Relative: 0 %
HCT: 22.7 % — ABNORMAL LOW (ref 36.0–46.0)
HEMOGLOBIN: 7.5 g/dL — AB (ref 12.0–15.0)
LYMPHS PCT: 2 %
Lymphs Abs: 0.3 10*3/uL — ABNORMAL LOW (ref 0.7–4.0)
MCH: 29.5 pg (ref 26.0–34.0)
MCHC: 33 g/dL (ref 30.0–36.0)
MCV: 89.4 fL (ref 78.0–100.0)
MONO ABS: 0.5 10*3/uL (ref 0.1–1.0)
MONOS PCT: 4 %
Metamyelocytes Relative: 0 %
Myelocytes: 0 %
NEUTROS ABS: 12.1 10*3/uL — AB (ref 1.7–7.7)
NEUTROS PCT: 94 %
NRBC: 27 /100{WBCs} — AB
OTHER: 0 %
PROMYELOCYTES ABS: 0 %
Platelets: 342 10*3/uL (ref 150–400)
RBC: 2.54 MIL/uL — ABNORMAL LOW (ref 3.87–5.11)
RDW: 25.8 % — ABNORMAL HIGH (ref 11.5–15.5)
WBC: 12.9 10*3/uL — ABNORMAL HIGH (ref 4.0–10.5)

## 2016-05-20 LAB — RETICULOCYTES
RBC.: 2.54 MIL/uL — ABNORMAL LOW (ref 3.87–5.11)
Retic Count, Absolute: 558.8 10*3/uL — ABNORMAL HIGH (ref 19.0–186.0)
Retic Ct Pct: 22 % — ABNORMAL HIGH (ref 0.4–3.1)

## 2016-05-20 MED ORDER — PREDNISONE 20 MG PO TABS: 20.0000 mg | ORAL_TABLET | Freq: Every day | ORAL | Status: DC

## 2016-05-20 MED ORDER — PREDNISONE 20 MG PO TABS: 40.0000 mg | ORAL_TABLET | Freq: Every day | ORAL | Status: DC

## 2016-05-20 MED ORDER — PREDNISONE 50 MG PO TABS: 50.0000 mg | ORAL_TABLET | Freq: Every day | ORAL | Status: DC

## 2016-05-20 MED ORDER — MORPHINE SULFATE 15 MG PO TABS: 15.0000 mg | ORAL_TABLET | Freq: Two times a day (BID) | ORAL | 0 refills | Status: DC | PRN

## 2016-05-20 MED ORDER — PREDNISONE 10 MG PO TABS: 10.0000 mg | ORAL_TABLET | Freq: Every day | ORAL | 0 refills | Status: DC

## 2016-05-20 MED ORDER — NEPRO/CARBSTEADY PO LIQD: 237.0000 mL | Freq: Two times a day (BID) | ORAL | 0 refills | Status: AC

## 2016-05-20 MED ORDER — PREDNISONE 20 MG PO TABS: 30.0000 mg | ORAL_TABLET | Freq: Every day | ORAL | Status: DC

## 2016-05-20 MED ORDER — PREDNISONE 50 MG PO TABS
60.0000 mg | ORAL_TABLET | Freq: Every day | ORAL | Status: DC
  Administered 2016-05-21: 60 mg via ORAL
  Filled 2016-05-20: qty 1

## 2016-05-20 MED ORDER — PREDNISONE 10 MG PO TABS: 30.0000 mg | ORAL_TABLET | Freq: Every day | ORAL | 0 refills | Status: DC

## 2016-05-20 MED ORDER — PREDNISONE 20 MG PO TABS: 20.0000 mg | ORAL_TABLET | Freq: Every day | ORAL | 0 refills | Status: DC

## 2016-05-20 MED ORDER — METHYLPREDNISOLONE SODIUM SUCC 125 MG IJ SOLR
125.0000 mg | Freq: Once | INTRAMUSCULAR | Status: AC
  Administered 2016-05-20: 125 mg via INTRAVENOUS
  Filled 2016-05-20: qty 2

## 2016-05-20 MED ORDER — PREDNISONE 10 MG PO TABS: 10.0000 mg | ORAL_TABLET | Freq: Every day | ORAL | Status: DC

## 2016-05-20 MED ORDER — PANTOPRAZOLE SODIUM 40 MG PO TBEC: 40.0000 mg | DELAYED_RELEASE_TABLET | Freq: Every day | ORAL | Status: DC

## 2016-05-20 MED ORDER — PREDNISONE 20 MG PO TABS: 60.0000 mg | ORAL_TABLET | Freq: Every day | ORAL | Status: DC

## 2016-05-20 MED ORDER — PREDNISONE 20 MG PO TABS: 40.0000 mg | ORAL_TABLET | Freq: Every day | ORAL | 0 refills | Status: DC

## 2016-05-20 NOTE — Clinical Social Work Placement (Addendum)
   CLINICAL SOCIAL WORK PLACEMENT  NOTE  Date:  05/20/2016  Patient Details  Name: Jane Short MRN: [id number] Date of Birth: 08/03/1950  Clinical Social Work is seeking post-discharge placement for this patient at the [place] level of care (*CSW will initial, date and re-position this form in  chart as items are completed):  Yes   Patient/family provided with [facility]'s list of facilities offering this level of care within the geographic area requested by the patient (or if unable, by the patient's family).  Yes   Patient/family informed of their freedom to choose among providers that offer the needed level of care, that participate in Medicare, Medicaid or managed care program needed by the patient, have an available bed and are willing to accept the patient.  Yes   Patient/family informed of [facility]'s ownership interest in [facility] and [facility], as well as of the fact that they are under no obligation to receive care at these facilities.  PASRR submitted to EDS on 05/20/16     PASRR number received on 05/20/16     Existing PASRR number confirmed on       FL2 transmitted to all facilities in geographic area requested by pt/family on 05/20/16     FL2 transmitted to all facilities within larger geographic area on       Patient informed that his/her managed care company has contracts with or will negotiate with certain facilities, including the following:            Patient/family informed of bed offers received.  Patient chooses bed at       Physician recommends and patient chooses bed at      Patient to be transferred to   on  .  Patient to be transferred to facility by       Patient family notified on   of transfer.  Name of family member notified:        PHYSICIAN      Additional Comment:  05/20/16 at 5:56 pm - Patient and spouse provided with 2 facility responses - [facility] and [facility].    _______________________________________________ Sable Feil, LCSW 05/20/2016, 5:09 PM

## 2016-05-20 NOTE — Progress Notes (Signed)
SICKLE CELL SERVICE PROGRESS NOTE  Jane Short [id number] DOB: 1950-06-29 DOA: 05/13/2016 PCP: Tommi Rumps, MD  Assessment/Plan: Principal Problem:   Acute encephalopathy Active Problems:   Left hip pain   Elevated bilirubin   Sickle cell crisis (HCC)   AKI (acute kidney injury) ([place])   CKD (chronic kidney disease) stage 5, GFR less than 15 ml/min (HCC)   Valvular heart disease   Acute respiratory failure with hypoxia ([place])  1. Acute Gout Left Knee: Synovial fluid consistent with gout. Gave a dose of Solumedrol today and will continue with prednisone taper. Will check Uric Acid levels. May need Allopurinol.  2. Back Pain: MRI findings noted No further pain in the back  even with movement today. Discussed with N-S yesterday and he is in agreement with out patient evaluation by Neurosurgery or Orthopedic surgery. She has not required any further pain medications.  3. Gait Abnormality: Pt was evaluated by PT and recommendations were made for SNF for rehab. SW to see patient today. Family wants to pursue rehab placement in the [place] area.   4. Anemia of Chronic Disease: Pt had a transfusion of 1 unit RBC's as well as FERAHEME yesterday. Awaiting CBC with diff from today. 5. Hypoxia: Saturations 97-100% on RA today.    6. Acute on CKD IV:  Her baseline Cr is 3.8 and baseline BUN 56. BUN and Cr increased mildly. Will defer to Nephrology. 7. Chronic Hemolysis: Pt would benefit from a therapeutic dose of Hydrea to induce a more stable population of Hb however per her records she has a h/o Lichen Planus associate with Hydrea use. There is no data to support that sub-theapeutic dose. Will D/C Hydrea in light of low Hb. 8. Hb SS without crisis: Pt is not in crisis.She is opiate naive and has been using Aleve for her hip pains. She however has CKD IV and NSAID's are contraindicated. Continue Folic Acid.  9. Hyperkalemia: Resolved.  10. Elevated BNP: Uninterpretable in a setting of CKD  and Cardiac Valvular disease. 11. UTI: Improved: U/A findings consistent with UTI . On day #3 of Rocephin. Urine culture shows 40,000 colonies of Proteus Mirabilis which is sensitive to Rocephin. Pt has completed 3 days of therapy. Antibiotics discontinued yesterday. 12. Leukocytosis: WBC elevation likely a reflection of the acute gouty flare. Gait abnormality and weakness:  PT and OT input appreciated. 13. Constipation:  Continue Senna-S. Pt still has not had a BM. Will order SMOG enema.   14. Valvular Heart Disease with Mild decrease in Systolic Function: Continue Pacerone and Lasix held as patient euvolemic.  2-ECHO shows stable findings since last ECHO.  15. 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  17. Lichen Planus:Please note that nursing documented stage II pressure ulcer on RLE. However this is not a pressure ulcer but rather healed Lichen Planus lesions 18. Chronic pain: Pt usually takes Aleve for chronic pain in her hips despite CKD and being advised against taking this.    Code Status: Full Code Family Communication: Husband at bedside and updated. Disposition Plan: Anticipate discharge to SNF tomorrow.  Woodruff Skirvin A.  Pager [phone]. If 7PM-7AM, please contact night-coverage.  05/20/2016, 11:22 AM  LOS: 7 days   Interim History: This is an opiate naive patient with Hb ss, CKD IV, Aortic and A-V Valvular disease, CHF, Idiopathic Pulmonary HTN and OSA who was admitted to [facility] with hemolytic anemia and sickle cell crisis. Per her husband and confirmed by patient, her pain  began when she had a car ride to their home in [place]. She was seen in the ED as the pain was excruciating and she was unable to tolerate it. She received "a shot" and was discharged with a prescription for Morphine 15 mg. She states that she took about 4 doses but had about 3 episodes of emesis which she attributes to the Morphine. However their is  not a strong temporal relationship between Morphine and emesis. She did have significant itching with the Morphine. She was seen by her Hematologist in the office and found to have a Hb of 5.0 g/dL so was sent to the hospital for further evaluation.  At [facility], she was transfused 2 units RBC's for a 5.0 g/dL which improved to 7.6 g/dL after transfusion. Today her Hb is 6.1 g/dL. Per her records, her baseline Hb is  She normally takes . However her LDH is down to 646 from 901.  She also has chronically elevated bilirubin likely due to chronic hemolysis. She reports that her dry weight is 167 #'s and she takes Lasix 80 mg daily expect when her weight is increased she takes it BID. He last lasix was 6 days ago and her weight today is 192 #'s. However a review of office visits in the last year show weights of 194 3's.  Her clinical course was consistennt with Uremia and she was seen by Nephrology and transferred to [facility] for dialysis. Pt up in chair today and reports moderate pain in knee after transferring to chair. Pt states that she feels back to her baseline except for the pain in her knee. She also remembers now that she had swelling and pain in the same knee about 1 month ago and tried to see a "specialist" without success.    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 I have also reviewed and a TEE done by Dr. 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 However per her husband, it was felt that surgery would be a greater risk to the patient given her overall medical condition. Review of her Hb electrophoresis from [facility] is consistent with Hb SS in a transfused patient. She also has a h/o idiopathic pul;monary HTN and she a smoking history but is not a current smoker. However has a Dx of COPD and is on Ellipta.  Review of labs shows previous BNP of 911. BNP to be 680 and as high as 901  in the past. Also spoke with Dr. Burlene Arnt (Pt's Hematologist) and reviewed MRI with Dr. Clearnce Hasten.   Consultants:  Nephrology- Dr. Justin Mend  Dr. Burlene Arnt- Hematology (Phone)  Dr. Kathyrn Sheriff- Neurosurgery (Phone)  Dr. Erlinda Hong- Orthopedic Surgery  Procedures:  Dialysis- 05/15/2016  Arthrocentesis 05/19/2016  Antibiotics:  Rocephin 12/14 >>12/17   Objective: Vitals:   05/20/16 0026 05/20/16 0420 05/20/16 0849 05/20/16 1038  BP: (!) 111/44 (!) 114/51 (!) 108/46   Pulse: 74 85 76   Resp: 16 16 16    Temp:  97.8 F (36.6 C) 98 F (36.7 C)   TempSrc:  Oral Oral   SpO2: 96% 95% 91% 97%  Weight:      Height:       Weight change: 0.2 kg (7.1 oz)  Intake/Output Summary (Last 24 hours) at 05/20/16 1122 Last data filed at 05/20/16 0934  Gross per 24 hour  Intake  1345 ml  Output             1075 ml  Net              270 ml    General: Alert, awake, oriented x3, in no apparent distress today. Well appearing. sitting up in chair.  HEENT: [place]/AT PEERL, Anicteric. Pt also has muddy sclera. Anicteric Neck: Trachea midline,  no masses, no thyromegal,y no JVD, no carotid bruit OROPHARYNX:  Moist, No exudate/ erythema/lesions.  Heart: Regular rate and rhythm, without rubs or gallops. She has SEM II/VI at 2nd intercostal space.  PMI non-displaced, no heaves or thrills on palpation.  Lungs: Clear to auscultation with good air entry. No wheezing or rhonchi noted. No increased vocal fremitus resonant to percussion  Abdomen: Soft, nontender, nondistended, positive bowel sounds, no masses no hepatosplenomegaly noted.  Neuro: No focal neurological deficits noted cranial nerves II through XII grossly intact.  Strength at baseline  in bilateral upper. Pt able to move BLE's  Though partial range against gravity. Musculoskeletal: No warmth  or erythema noted around joints, swelling in the left knee decreased since yesterday (s/p arthrocentesis). No spinal tenderness noted  today. Psychiatric: Patient alert and oriented x3, good insight and cognition, good recent to remote recall.    Data Reviewed: Basic Metabolic Panel:  Recent Labs Lab 05/14/16 1717 05/15/16 0630 05/16/16 1010 05/17/16 1131 05/18/16 0347 05/20/16 0440  NA 144 145 140 139 137 136  K 5.4* 5.0 4.1 4.5 4.3 4.2  CL 112* 110 104 102 98* 100*  CO2 22 24 26 25 27 26   GLUCOSE 89 92 99 102* 104* 79  BUN 87* 83* 51* 63* 73* 68*  CREATININE 3.59* 3.71* 2.87* 3.39* 3.80* 3.42*  CALCIUM 9.8 10.2 9.7 10.0 9.8 10.3  PHOS 6.5*  --  5.5* 6.4* 6.4* 4.4   Liver Function Tests:  Recent Labs Lab 05/13/16 2343 05/14/16 0234  05/15/16 0630 05/16/16 1010 05/17/16 1131 05/18/16 0347 05/20/16 0440  AST 25  --   --  27  --   --   --   --   ALT 11*  --   --  12*  --   --   --   --   ALKPHOS 148*  --   --  137*  --   --   --   --   BILITOT 15.4* 19.2*  --  15.9*  --   --   --   --   PROT 6.7  --   --  6.1*  --   --   --   --   ALBUMIN 3.1*  --   < > 2.4* 2.4* 2.3* 2.4* 2.3*  < > = values in this interval not displayed. No results for input(s): LIPASE, AMYLASE in the last 168 hours.  Recent Labs Lab 05/14/16 1740  AMMONIA 46*   CBC:  Recent Labs Lab 05/13/16 2343 05/14/16 0243 05/15/16 0630 05/16/16 0830 05/17/16 1131 05/19/16 0418  WBC 14.6* 16.1* 13.0* 12.2* 12.9* 16.0*  NEUTROABS 12.0*  --  10.8* 9.3* 7.6 13.0*  HGB 6.1* 6.3* 6.1* 6.1* 5.5* 4.9*  HCT 17.6* 18.2* 18.5* 18.3* 16.8* 15.0*  MCV 91.2 92.9 92.0 92.4 90.3 89.3  PLT 184 178 177 180 201 288   Cardiac Enzymes: No results for input(s): CKTOTAL, CKMB, CKMBINDEX, TROPONINI in the last 168 hours. BNP (last 3 results)  Recent Labs  06/05/15 0407 08/28/15 1216 05/13/16 2343  BNP 683.0* 697.0* 911.7*  ProBNP (last 3 results) No results for input(s): PROBNP in the last 8760 hours.  CBG:  Recent Labs Lab 05/14/16 0317 05/17/16 0835  GLUCAP 107* 106*    Recent Results (from the past 240 hour(s))   Culture, blood (Routine X 2) w Reflex to ID Panel     Status: None   Collection Time: 05/14/16  2:35 AM  Result Value Ref Range Status   Specimen Description BLOOD RIGHT ARM  Final   Special Requests IN PEDIATRIC BOTTLE 4CC  Final   Culture   Final    NO GROWTH 5 DAYS Performed at [facility]    Report Status 05/19/2016 FINAL  Final  Culture, blood (Routine X 2) w Reflex to ID Panel     Status: None   Collection Time: 05/14/16  2:40 AM  Result Value Ref Range Status   Specimen Description BLOOD RIGHT HAND  Final   Special Requests BOTTLES DRAWN AEROBIC ONLY 5CC  Final   Culture   Final    NO GROWTH 5 DAYS Performed at [facility]    Report Status 05/19/2016 FINAL  Final  MRSA PCR Screening     Status: None   Collection Time: 05/14/16  3:57 AM  Result Value Ref Range Status   MRSA by PCR NEGATIVE NEGATIVE Final    Comment:        The GeneXpert MRSA Assay (FDA approved for NASAL specimens only), is one component of a comprehensive MRSA colonization surveillance program. It is not intended to diagnose MRSA infection nor to guide or monitor treatment for MRSA infections.   Culture, Urine     Status: Abnormal   Collection Time: 05/14/16  4:05 AM  Result Value Ref Range Status   Specimen Description URINE, CLEAN CATCH  Final   Special Requests NONE  Final   Culture 40,000 COLONIES/mL PROTEUS MIRABILIS (A)  Final   Report Status 05/16/2016 FINAL  Final   Organism ID, Bacteria PROTEUS MIRABILIS (A)  Final      Susceptibility   Proteus mirabilis - MIC*    AMPICILLIN <=2 SENSITIVE Sensitive     CEFAZOLIN <=4 SENSITIVE Sensitive     CEFTRIAXONE <=1 SENSITIVE Sensitive     CIPROFLOXACIN >=4 RESISTANT Resistant     GENTAMICIN <=1 SENSITIVE Sensitive     IMIPENEM 1 SENSITIVE Sensitive     NITROFURANTOIN 128 RESISTANT Resistant     TRIMETH/SULFA >=320 RESISTANT Resistant     AMPICILLIN/SULBACTAM <=2 SENSITIVE Sensitive     PIP/TAZO <=4 SENSITIVE Sensitive      * 40,000 COLONIES/mL PROTEUS MIRABILIS  Gram stain     Status: None   Collection Time: 05/19/16 12:05 PM  Result Value Ref Range Status   Specimen Description FLUID SYNOVIAL LEFT KNEE  Final   Special Requests NONE  Final   Gram Stain   Final    ABUNDANT WBC PRESENT, PREDOMINANTLY PMN NO ORGANISMS SEEN    Report Status 05/19/2016 FINAL  Final     Studies: Ct Abdomen Pelvis Wo Contrast  Result Date: 05/14/2016 CLINICAL DATA:  65 y/o F; decreasing hemoglobin with concern for retroperitoneal hemorrhage or hemorrhage at the painful left hip. History of sickle cell disease and cholecystectomy. EXAM: CT ABDOMEN AND PELVIS WITHOUT CONTRAST TECHNIQUE: Multidetector CT imaging of the abdomen and pelvis was performed following the standard protocol without IV contrast. COMPARISON:  05/17/2015 CT abdomen and pelvis. FINDINGS: Lower chest: Severe cardiomegaly. Decreased attenuation of cardiac blood pool compatible with anemia. Hepatobiliary: No focal liver  abnormality is seen. Status post cholecystectomy. No biliary dilatation. Pancreas: Unremarkable. No pancreatic ductal dilatation or surrounding inflammatory changes. Spleen: Status post splenectomy. Adrenals/Urinary Tract: Adrenal glands are unremarkable. Stable left kidney interpolar 26 mm cyst. Kidneys are otherwise normal, without renal calculi, focal lesion, or hydronephrosis. Bladder is unremarkable. Stomach/Bowel: Stomach is within normal limits. Appendix appears normal. No evidence of bowel wall thickening, distention, or inflammatory changes. Few scattered diverticulum of the colon. Vascular/Lymphatic: Aortic atherosclerosis with moderate calcifications. No enlarged abdominal or pelvic lymph nodes. Reproductive: Uterus and bilateral adnexa are unremarkable. Other: No abdominal wall hernia or abnormality. No abdominopelvic ascites. Musculoskeletal: Mixed lucent and sclerotic foci in the right femoral head, left proximal femur, bilateral ilium, and  small lucencies in vertebral bodies probably representing multiple bone infarcts given history of sickle cell disease. IMPRESSION: 1. No hemorrhage identified. 2. Severe cardiomegaly. 3. Aortic atherosclerosis. 4. Bony stigmata of sickle cell disease with interval right femoral head avascular necrosis. Electronically Signed   By: Kristine Garbe M.D.   On: 05/14/2016 05:18   Dg Chest 1 View  Result Date: 05/13/2016 CLINICAL DATA:  Hypoxia, history sickle cell anemia, COPD EXAM: CHEST 1 VIEW COMPARISON:  Portable exam 1550 hours compared 06/22/2015 FINDINGS: LEFT jugular Port-A-Cath with tip projecting over SVC. Enlargement of cardiac silhouette with pulmonary vascular congestion. Aortic atherosclerosis. Lungs grossly clear. No definite infiltrate, pleural effusion or pneumothorax. No acute osseous findings. IMPRESSION: Enlargement of cardiac silhouette with pulmonary vascular congestion. No acute abnormalities. Electronically Signed   By: Lavonia Dana M.D.   On: 05/13/2016 16:05   Dg Knee 1-2 Views Left  Result Date: 05/18/2016 CLINICAL DATA:  Left knee pain. EXAM: LEFT KNEE - 1-2 VIEW COMPARISON:  None. FINDINGS: No acute fracture or dislocation. Osteochondral lesion involving the patella. Large joint effusion. Severe osteopenia. IMPRESSION: Osteochondral lesion of the patella.  Large joint effusion. Electronically Signed   By: Kathreen Devoid   On: 05/18/2016 11:15   Ct Head Wo Contrast  Result Date: 05/14/2016 CLINICAL DATA:  65 year old female with acute encephalopathy. The patient is lethargic, disoriented, and generalized weakness. EXAM: CT HEAD WITHOUT CONTRAST TECHNIQUE: Contiguous axial images were obtained from the base of the skull through the vertex without intravenous contrast. COMPARISON:  None. FINDINGS: Brain: The ventricles and sulci are appropriate in size for patient's age. Mild periventricular and deep white matter chronic microvascular ischemic changes noted. There is no  acute intracranial hemorrhage. No mass effect or midline shift noted. Vascular: No hyperdense vessel or unexpected calcification. Skull: There is diffuse ground-glass appearance of the skull with "Salt and pepper" appearance may be related to underlying hyperparathyroidism. Multiple lucent lesions within the skull may represent brown tumor of the bone secondary to hyperparathyroidism. Correlation with clinical exam and parathyroid function tests recommended. Sinuses/Orbits: No acute finding. Other: None IMPRESSION: No acute intracranial pathology. Mild age-related atrophy and chronic microvascular ischemic changes. Ground-glass appearance of the skull suggestive of hyperparathyroidism. Correlation with clinical exam and blood calcium levels recommended. Electronically Signed   By: Anner Crete M.D.   On: 05/14/2016 05:17   Mr Lumbar Spine Wo Contrast  Result Date: 05/14/2016 CLINICAL DATA:  Back pain at L4-5.  History of sickle cell anemia. EXAM: MRI LUMBAR SPINE WITHOUT CONTRAST TECHNIQUE: Multiplanar, multisequence MR imaging of the lumbar spine was performed. No intravenous contrast was administered. Patient was unable to complete examination due to pain, axial T1 sequence not obtained. COMPARISON:  CT abdomen and pelvis May 14, 2016 at 0432 hours FINDINGS: Mild motion  degraded examination. SEGMENTATION: For the purposes of this report, the last well-formed intervertebral disc will be described as L5-S1. ALIGNMENT: Maintenance of the lumbar lordosis. Minimal grade 1 L4-5 anterolisthesis without spondylolysis. VERTEBRAE:Markedly decreased bone marrow signal on all sequences compatible with bone marrow replacement/reactivation. Vertebral bodies are intact. Mild L4-5 disc height loss, with slight desiccation wall this suspected. Multilevel mild chronic discogenic endplate changes. Focal bright T1 and bright T2 signal in the LEFT ventral L5 vertebral body most compatible with hemangioma. CONUS  MEDULLARIS: Conus medullaris terminates at L1-2 and demonstrates normal morphology and signal characteristics. Limited assessment of cauda equina due to mild patient motion and lack of T1 sequence. PARASPINAL AND SOFT TISSUES: Included prevertebral and paraspinal soft tissues are nonsuspicious, moderate symmetric paraspinal muscle atrophy. Renal cysts better seen on today's CT abdomen and pelvis. DISC LEVELS: L1-2 and L2-3: Potential annular fissures without significant disc bulge, canal stenosis or neural foraminal narrowing. L3-4: Small LEFT extraforaminal disc protrusion could affect the exited LEFT L4 nerve. Mild facet arthropathy and ligamentum flavum redundancy. Mild RIGHT, mild to moderate LEFT neural foraminal narrowing. L4-5: Widened facets to 5 mm with RIGHT greater than LEFT moderate facet arthropathy. Moderate broad-based disc bulge asymmetric to the RIGHT encroaches upon the exited RIGHT L4 nerve. Mild canal stenosis. Moderate to severe RIGHT, mild LEFT neural foraminal narrowing. L5-S1: Small broad-based disc bulge. Mild facet arthropathy and ligamentum flavum redundancy without canal stenosis. Mild RIGHT neural foraminal narrowing. IMPRESSION: Abnormally decreased bone marrow signal attributed to patient's sickle cell anemia though can be seen with other myeloproliferative disorders. Moderate L4-5 facet arthropathy, widened facets associated inflammatory changes and dynamic instability. Minimal grade 1 L4-5 anterolisthesis. This would be better characterized on flexion extension radiographs. Mild canal stenosis L4-5. Neural foraminal narrowing L3-4 through L5-S1: Moderate to severe at L4-5. Electronically Signed   By: Elon Alas M.D.   On: 05/14/2016 22:42   Dg Hip Unilat With Pelvis 2-3 Views Left  Result Date: 05/11/2016 CLINICAL DATA:  Hip pain. EXAM: DG HIP (WITH OR WITHOUT PELVIS) 2-3V LEFT COMPARISON:  None. FINDINGS: There is no evidence of hip fracture or dislocation. There is no  evidence of arthropathy or other focal bone abnormality. IMPRESSION: Normal left hip. Electronically Signed   By: Marijo Conception, M.D.   On: 05/11/2016 15:59    Scheduled Meds: . sodium chloride   Intravenous Once  . amiodarone  100 mg Oral Daily  . calcitRIOL  0.25 mcg Oral Daily  . feeding supplement (NEPRO CARB STEADY)  237 mL Oral BID BM  . fluticasone furoate-vilanterol  1 puff Inhalation Daily  . folic acid  1 mg Oral Daily  . methylPREDNISolone (SOLU-MEDROL) injection  125 mg Intravenous Once  . morphine  15 mg Oral Once  . pantoprazole  40 mg Oral Daily  . senna-docusate  1 tablet Oral BID  . sodium bicarbonate  650 mg Oral BID  . sodium chloride flush  10-40 mL Intracatheter Q12H   Continuous Infusions:  Principal Problem:   Acute encephalopathy Active Problems:   Left hip pain   Elevated bilirubin   Sickle cell crisis (HCC)   AKI (acute kidney injury) ([place])   CKD (chronic kidney disease) stage 5, GFR less than 15 ml/min (HCC)   Valvular heart disease   Acute respiratory failure with hypoxia (HCC)    In excess of 40 minutes spent during this visit. 50% involved face to face contact with the patient for assessment, counseling and coordination of care.

## 2016-05-20 NOTE — Clinical Social Work Note (Addendum)
Clinical Social Work Assessment  Patient Details  Name: Jane Short MRN: [id number] Date of Birth: Dec 06, 1950  Date of referral:  05/18/16               Reason for consult:  Facility Placement                Permission sought to share information with:  Family Supports Permission granted to share information::  Yes, Verbal Permission Granted  Name::     Annice Nibbe  Agency::     Relationship::  Husband  Contact Information:  [phone]  Housing/Transportation Living arrangements for the past 2 months:  New Freedom of Information:  Patient, Spouse Patient Interpreter Needed:  None Criminal Activity/Legal Involvement Pertinent to Current Situation/Hospitalization:  No - Comment as needed Significant Relationships:  Other Family Members, Spouse Lives with:  Spouse Do you feel safe going back to the place where you live?  No (Patient and spouse in agreement with ST rehab) Need for family participation in patient care:  Yes (Comment)  Care giving concerns:  Patient and husband in agreement with ST rehab for strengthening and safety before returning home.   Social Worker assessment / plan:  CSW talked with patient and husband at the bedside regarding discharge plan and recommendation of ST rehab. Patient was awake, alert, sitting in a chair and agreeable to talking with CSW.  CSW informed that patient/spouse each have an adult child from a previous relationship.  Mrs. Matsushita reported that she has never been to a skilled facility before and the facility search process was explained to them and SNF list for [facility]. Patient/husband agreeable to CSW following up with them on 12/11 to provide facility responses and get their decision.    Employment status:  Disabled (Comment on whether or not currently receiving Disability) Insurance information:  Medicare, Managed Care Psychologist, counselling) PT Recommendations:  [place] / Referral to community  resources:  Other (Comment Required) (Patient provided with facility responses and chose [facility]. )  Patient/Family's Response to care:  No concerns expressed to CSW regarding care during hospitalization.  Patient/Family's Understanding of and Emotional Response to Diagnosis, Current Treatment, and Prognosis:  Not discussed.  Emotional Assessment Appearance:  Appears stated age Attitude/Demeanor/Rapport:  Other (Appropriate) Affect (typically observed):  Appropriate, Pleasant Orientation:  Oriented to Self, Oriented to Place, Oriented to  Time, Oriented to Situation Alcohol / Substance use:  Never Used Psych involvement (Current and /or in the community):  No (Comment)  Discharge Needs  Concerns to be addressed:  Discharge Planning Concerns Readmission within the last 30 days:  Yes Current discharge risk:  None Barriers to Discharge:  No Barriers Identified   Sable Feil, LCSW 05/20/2016, 5:03 PM

## 2016-05-20 NOTE — NC FL2 (Signed)
[place] LEVEL OF CARE SCREENING TOOL     IDENTIFICATION  Patient Name: Jane Short Birthdate: Sep 21, 1950 Sex: female Admission Date (Current Location): 05/13/2016  [facility] and [place] Number:  Herbalist and Address:  [facility] [place], [place] [address], [place], [place] [address]      Provider Number: [id number]  Attending Physician Name and Address:  Leana Gamer, MD  Relative Name and Phone Number:  Karinah Stoudmire. Phone [phone] (mobile)    Current Level of Care: Hospital Recommended Level of Care: [place] Prior Approval Number:    Date Approved/Denied:   PASRR Number: [id number] (Eff. 05/13/16)  Discharge Plan: SNF    Current Diagnoses: Patient Active Problem List   Diagnosis Date Noted  . Spinal stenosis, lumbar region, without neurogenic claudication 05/20/2016  . CKD (chronic kidney disease) stage 5, GFR less than 15 ml/min (HCC) 05/13/2016  . Valvular heart disease 05/13/2016  . Renal dialysis device, implant, or graft complication 0000000  . Iron deficiency anemia 12/06/2015  . Anemia of chronic renal failure, stage 4 (severe) ([place]) 12/06/2015  . Chronic progressive renal failure 08/28/2015  . Atrial fibrillation ([place]) 07/30/2015  . Insomnia 07/30/2015  . H. pylori infection 07/30/2015  . Bilirubinemia 06/27/2015  . CHF (congestive heart failure) ([place]) 06/05/2015  . Elevated bilirubin   . Hernia, ventral 04/17/2015  . Lichen planus [id number]  . CKD (chronic kidney disease), stage IV ([place]) 01/17/2015  . Atherosclerosis of aorta ([place]) 01/10/2015  . Abnormal CXR 01/03/2015  . Bilateral low back pain without sciatica 01/03/2015  . Hb-SS disease without crisis ([place]) 10/17/2014  . Screening for colon cancer 08/04/2012  . Screening for breast cancer 08/04/2012  . Edema 11/19/2011  . Atrial myxoma 07/23/2011    Orientation RESPIRATION BLADDER Height & Weight     Self,  Time, Situation, Place  Normal (3L O2 ) Incontinent Weight: 184 lb 1.4 oz (83.5 kg) Height:  5\' 5"  (165.1 cm)  BEHAVIORAL SYMPTOMS/MOOD NEUROLOGICAL BOWEL NUTRITION STATUS      Continent Diet (Renal - 1200 mL fluid restriction)  AMBULATORY STATUS COMMUNICATION OF NEEDS Skin   Extensive Assist (Patient did not ambulate with PT on 05/20/16) Verbally Skin abrasions (Abrasion bilateral leg)                       Personal Care Assistance Level of Assistance  Bathing, Feeding, Dressing Bathing Assistance: Maximum assistance (Upper body-Supervision/Lower body-Max assist) Feeding assistance: Independent Dressing Assistance: Maximum assistance (Upper body-min assist/Lower body total assist)     Functional Limitations Info  Sight, Hearing, Speech Sight Info: Impaired (Wears glasses) Hearing Info: Adequate Speech Info: Adequate    SPECIAL CARE FACTORS FREQUENCY  PT (By licensed PT), OT (By licensed OT)     PT Frequency: Evaluated 12/17 and a minimum of 3X per week therapy recommended OT Frequency: Evaluated 12/17 and a minimum of 3X per week therapy recommended            Contractures Contractures Info: Not present    Additional Factors Info  Code Status, Allergies Code Status Info: Full Allergies Info: Ramipril, Septra           Current Medications (05/20/2016):  This is the current hospital active medication list Current Facility-Administered Medications  Medication Dose Route Frequency Provider Last Rate Last Dose  . 0.9 %  sodium chloride infusion   Intravenous Once Leana Gamer, MD      . amiodarone (  PACERONE) tablet 100 mg  100 mg Oral Daily Lily Kocher, MD   100 mg at 05/20/16 1100  . calcitRIOL (ROCALTROL) capsule 0.25 mcg  0.25 mcg Oral Daily Lily Kocher, MD   0.25 mcg at 05/20/16 1100  . feeding supplement (NEPRO CARB STEADY) liquid 237 mL  237 mL Oral BID BM Leana Gamer, MD   237 mL at 05/20/16 1455  . fluticasone furoate-vilanterol (BREO  ELLIPTA) 200-25 MCG/INH 1 puff  1 puff Inhalation Daily Lily Kocher, MD   1 puff at 05/20/16 1038  . folic acid (FOLVITE) tablet 1 mg  1 mg Oral Daily Lily Kocher, MD   1 mg at 05/20/16 1100  . morphine (MSIR) tablet 15 mg  15 mg Oral Once Ritta Slot, NP      . morphine (MSIR) tablet 15 mg  15 mg Oral Q12H PRN Leana Gamer, MD   15 mg at 05/20/16 1153  . ondansetron (ZOFRAN) tablet 4 mg  4 mg Oral Q4H PRN Lily Kocher, MD       Or  . ondansetron [facility]) injection 4 mg  4 mg Intravenous Q4H PRN Lily Kocher, MD   4 mg at 05/18/16 1128  . pantoprazole (PROTONIX) EC tablet 40 mg  40 mg Oral Daily Lily Kocher, MD   40 mg at 05/20/16 1100  . polyethylene glycol (MIRALAX / GLYCOLAX) packet 17 g  17 g Oral Daily PRN Lily Kocher, MD   17 g at 05/20/16 1052  . [START ON 05/21/2016] predniSONE (DELTASONE) tablet 60 mg  60 mg Oral Q breakfast Leana Gamer, MD       Followed by  . [START ON 05/23/2016] predniSONE (DELTASONE) tablet 50 mg  50 mg Oral Q breakfast Leana Gamer, MD       Followed by  . [START ON 05/25/2016] predniSONE (DELTASONE) tablet 40 mg  40 mg Oral Q breakfast Leana Gamer, MD       Followed by  . [START ON 05/27/2016] predniSONE (DELTASONE) tablet 30 mg  30 mg Oral Q breakfast Leana Gamer, MD       Followed by  . [START ON 05/29/2016] predniSONE (DELTASONE) tablet 20 mg  20 mg Oral Q breakfast Leana Gamer, MD       Followed by  . [START ON 05/31/2016] predniSONE (DELTASONE) tablet 10 mg  10 mg Oral Q breakfast Leana Gamer, MD      . senna-docusate (Senokot-S) tablet 1 tablet  1 tablet Oral BID Lily Kocher, MD   1 tablet at 05/20/16 1100  . sodium bicarbonate tablet 650 mg  650 mg Oral BID Lily Kocher, MD   650 mg at 05/20/16 1100  . sodium chloride flush (NS) 0.9 % injection 10-40 mL  10-40 mL Intracatheter Q12H Leana Gamer, MD   10 mL at 05/19/16 2150  . sodium chloride flush (NS) 0.9 % injection 10-40 mL  10-40 mL  Intracatheter PRN Leana Gamer, MD       Facility-Administered Medications Ordered in Other Encounters  Medication Dose Route Frequency Provider Last Rate Last Dose  . 0.9 %  sodium chloride infusion  250 mL Intravenous Once Cammie Sickle, MD      . acetaminophen (TYLENOL) tablet 650 mg  650 mg Oral Once Cammie Sickle, MD      . acetaminophen (TYLENOL) tablet 650 mg  650 mg Oral Once Cammie Sickle, MD      . diphenhydrAMINE (BENADRYL) capsule  25 mg  25 mg Oral Once Cammie Sickle, MD      . diphenhydrAMINE (BENADRYL) capsule 25 mg  25 mg Oral Once Cammie Sickle, MD      . furosemide (LASIX) injection 20 mg  20 mg Intravenous Once Cammie Sickle, MD      . furosemide (LASIX) injection 20 mg  20 mg Intravenous Once Cammie Sickle, MD      . heparin lock flush 100 unit/mL  250 Units Intracatheter PRN Leia Alf, MD      . heparin lock flush 100 unit/mL  500 Units Intracatheter Daily PRN Cammie Sickle, MD      . heparin lock flush 100 unit/mL  250 Units Intracatheter PRN Cammie Sickle, MD      . heparin lock flush 100 unit/mL  500 Units Intracatheter Daily PRN Cammie Sickle, MD      . heparin lock flush 100 unit/mL  250 Units Intracatheter PRN Cammie Sickle, MD      . sodium chloride flush (NS) 0.9 % injection 10 mL  10 mL Intracatheter PRN Cammie Sickle, MD      . sodium chloride flush (NS) 0.9 % injection 10 mL  10 mL Intracatheter PRN Cammie Sickle, MD      . sodium chloride flush (NS) 0.9 % injection 3 mL  3 mL Intracatheter PRN Cammie Sickle, MD      . sodium chloride flush (NS) 0.9 % injection 3 mL  3 mL Intracatheter PRN Cammie Sickle, MD         Discharge Medications: Please see discharge summary for a list of discharge medications.  Relevant Imaging Results:  Relevant Lab Results:   Additional Information SS# [id number].  Sable Feil, LCSW

## 2016-05-20 NOTE — Progress Notes (Signed)
Physical Therapy Treatment Patient Details Name: Jane Short MRN: [id number] DOB: [birth year] Today's Date: 05/20/2016    History of Present Illness Patient is a 65 yo female admitted 05/13/16 with Lt hip pain and anemia - sickle cell crisis.  Patient also with acute encephalopathy, AKI for HD, UTI, weakness.   PMH:  sickle cell disease, CKD, anemia, COPD, CHF, chronic pain, valvular heart disease, OSA.  Has home O2.    PT Comments    Limited by painful left foot.  Completed ROM exercise, 1 standing trial limited by the pain, attempt to use RW to pivot to the chair without success and then a face to face transfer.  Follow Up Recommendations  SNF;Supervision/Assistance - 24 hour     Equipment Recommendations  None recommended by PT    Recommendations for Other Services       Precautions / Restrictions Precautions Precautions: Fall    Mobility  Bed Mobility Overal bed mobility: Needs Assistance Bed Mobility: Supine to Sit;Sit to Supine     Supine to sit: Mod assist     General bed mobility comments: helped with LE's and trunk  Transfers Overall transfer level: Needs assistance Equipment used: Rolling walker (2 wheeled);None Transfers: Sit to/from Jane Short Sit to Stand: Mod assist;+2 physical assistance   Squat pivot transfers: Max assist;+2 physical assistance     General transfer comment: cues for hand placement and assist for forward and boost assist  Ambulation/Gait                 Stairs            Wheelchair Mobility    Modified Rankin (Stroke Patients Only)       Balance Overall balance assessment: Needs assistance   Sitting balance-Leahy Scale: Good     Standing balance support: Bilateral upper extremity supported Standing balance-Leahy Scale: Poor                      Cognition Arousal/Alertness: Awake/alert Behavior During Therapy: WFL for tasks assessed/performed;Flat affect Overall Cognitive  Status: Within Functional Limits for tasks assessed                      Exercises      General Comments        Pertinent Vitals/Pain Pain Assessment: Faces Faces Pain Scale: Hurts whole lot Pain Location: L foot pain Pain Descriptors / Indicators: Stabbing;Sharp Pain Intervention(s): Monitored during session;Limited activity within patient's tolerance    Home Living                      Prior Function            PT Goals (current goals can now be found in the care plan section) Acute Rehab PT Goals PT Goal Formulation: With patient/family Time For Goal Achievement: 05/31/16 Potential to Achieve Goals: Good Progress towards PT goals: Not progressing toward goals - comment (limited by (gout-like) pain)    Frequency    Min 3X/week      PT Plan Current plan remains appropriate    Co-evaluation             End of Session Equipment Utilized During Treatment: Gait belt Activity Tolerance: Patient limited by pain Patient left: in chair;with call bell/phone within reach;with chair alarm set     Time: [id number] PT Time Calculation (min) (ACUTE ONLY): 17 min  Charges:  $Therapeutic Activity: 8-22 mins  G CodesTessie Fass Annete Ayuso 05/20/2016, 3:52 PM  05/20/2016  Donnella Sham, [place] [phone]  (pager)

## 2016-05-20 NOTE — Progress Notes (Signed)
[facility] ROUNDING NOTE   Subjective:   Interval History:  Appears much better today    Knee improved considerably  Objective:  Vital signs in last 24 hours:  Temp:  [97.8 F (36.6 C)-98.2 F (36.8 C)] 98 F (36.7 C) (12/20 0849) Pulse Rate:  [70-88] 76 (12/20 0849) Resp:  [16-19] 16 (12/20 0849) BP: (100-114)/(36-58) 108/46 (12/20 0849) SpO2:  [91 %-100 %] 97 % (12/20 1038) Weight:  [83.5 kg (184 lb 1.4 oz)] 83.5 kg (184 lb 1.4 oz) (12/19 2101)  Weight change: 0.2 kg (7.1 oz) Filed Weights   05/17/16 2155 05/18/16 2142 05/19/16 2101  Weight: 85.3 kg (188 lb 0.8 oz) 83.3 kg (183 lb 10.3 oz) 83.5 kg (184 lb 1.4 oz)    Intake/Output: I/O last 3 completed shifts: In: [id number] [P.O.:720; I.V.:260; Blood:365] Out: [id number]   Intake/Output this shift:  Total I/O In: 600 [P.O.:600] Out: 500 [Urine:500]  CVS- RRR RS- CTA ABD- BS present soft non-distended EXT- no edema   Basic Metabolic Panel:  Recent Labs Lab 05/14/16 1717 05/15/16 0630 05/16/16 1010 05/17/16 1131 05/18/16 0347 05/20/16 0440  NA 144 145 140 139 137 136  K 5.4* 5.0 4.1 4.5 4.3 4.2  CL 112* 110 104 102 98* 100*  CO2 22 24 26 25 27 26   GLUCOSE 89 92 99 102* 104* 79  BUN 87* 83* 51* 63* 73* 68*  CREATININE 3.59* 3.71* 2.87* 3.39* 3.80* 3.42*  CALCIUM 9.8 10.2 9.7 10.0 9.8 10.3  PHOS 6.5*  --  5.5* 6.4* 6.4* 4.4    Liver Function Tests:  Recent Labs Lab 05/13/16 2343 05/14/16 0234  05/15/16 0630 05/16/16 1010 05/17/16 1131 05/18/16 0347 05/20/16 0440  AST 25  --   --  27  --   --   --   --   ALT 11*  --   --  12*  --   --   --   --   ALKPHOS 148*  --   --  137*  --   --   --   --   BILITOT 15.4* 19.2*  --  15.9*  --   --   --   --   PROT 6.7  --   --  6.1*  --   --   --   --   ALBUMIN 3.1*  --   < > 2.4* 2.4* 2.3* 2.4* 2.3*  < > = values in this interval not displayed. No results for input(s): LIPASE, AMYLASE in the last 168 hours.  Recent Labs Lab  05/14/16 1740  AMMONIA 46*    CBC:  Recent Labs Lab 05/13/16 2343 05/14/16 0243 05/15/16 0630 05/16/16 0830 05/17/16 1131 05/19/16 0418  WBC 14.6* 16.1* 13.0* 12.2* 12.9* 16.0*  NEUTROABS 12.0*  --  10.8* 9.3* 7.6 13.0*  HGB 6.1* 6.3* 6.1* 6.1* 5.5* 4.9*  HCT 17.6* 18.2* 18.5* 18.3* 16.8* 15.0*  MCV 91.2 92.9 92.0 92.4 90.3 89.3  PLT 184 178 177 180 201 288    Cardiac Enzymes: No results for input(s): CKTOTAL, CKMB, CKMBINDEX, TROPONINI in the last 168 hours.  BNP: Invalid input(s): POCBNP  CBG:  Recent Labs Lab 05/14/16 0317 05/17/16 0835  GLUCAP 107* 106*    Microbiology: Results for orders placed or performed during the hospital encounter of 05/13/16  Culture, blood (Routine X 2) w Reflex to ID Panel     Status: None   Collection Time: 05/14/16  2:35 AM  Result Value Ref Range Status  Specimen Description BLOOD RIGHT ARM  Final   Special Requests IN PEDIATRIC BOTTLE 4CC  Final   Culture   Final    NO GROWTH 5 DAYS Performed at [facility]    Report Status 05/19/2016 FINAL  Final  Culture, blood (Routine X 2) w Reflex to ID Panel     Status: None   Collection Time: 05/14/16  2:40 AM  Result Value Ref Range Status   Specimen Description BLOOD RIGHT HAND  Final   Special Requests BOTTLES DRAWN AEROBIC ONLY 5CC  Final   Culture   Final    NO GROWTH 5 DAYS Performed at [facility]    Report Status 05/19/2016 FINAL  Final  MRSA PCR Screening     Status: None   Collection Time: 05/14/16  3:57 AM  Result Value Ref Range Status   MRSA by PCR NEGATIVE NEGATIVE Final    Comment:        The GeneXpert MRSA Assay (FDA approved for NASAL specimens only), is one component of a comprehensive MRSA colonization surveillance program. It is not intended to diagnose MRSA infection nor to guide or monitor treatment for MRSA infections.   Culture, Urine     Status: Abnormal   Collection Time: 05/14/16  4:05 AM  Result Value Ref Range Status    Specimen Description URINE, CLEAN CATCH  Final   Special Requests NONE  Final   Culture 40,000 COLONIES/mL PROTEUS MIRABILIS (A)  Final   Report Status 05/16/2016 FINAL  Final   Organism ID, Bacteria PROTEUS MIRABILIS (A)  Final      Susceptibility   Proteus mirabilis - MIC*    AMPICILLIN <=2 SENSITIVE Sensitive     CEFAZOLIN <=4 SENSITIVE Sensitive     CEFTRIAXONE <=1 SENSITIVE Sensitive     CIPROFLOXACIN >=4 RESISTANT Resistant     GENTAMICIN <=1 SENSITIVE Sensitive     IMIPENEM 1 SENSITIVE Sensitive     NITROFURANTOIN 128 RESISTANT Resistant     TRIMETH/SULFA >=320 RESISTANT Resistant     AMPICILLIN/SULBACTAM <=2 SENSITIVE Sensitive     PIP/TAZO <=4 SENSITIVE Sensitive     * 40,000 COLONIES/mL PROTEUS MIRABILIS  Culture, body fluid-bottle     Status: None (Preliminary result)   Collection Time: 05/19/16 12:05 PM  Result Value Ref Range Status   Specimen Description FLUID SYNOVIAL LEFT KNEE  Final   Special Requests NONE  Final   Culture NO GROWTH < 24 HOURS  Final   Report Status PENDING  Incomplete  Gram stain     Status: None   Collection Time: 05/19/16 12:05 PM  Result Value Ref Range Status   Specimen Description FLUID SYNOVIAL LEFT KNEE  Final   Special Requests NONE  Final   Gram Stain   Final    ABUNDANT WBC PRESENT, PREDOMINANTLY PMN NO ORGANISMS SEEN    Report Status 05/19/2016 FINAL  Final    Coagulation Studies: No results for input(s): LABPROT, INR in the last 72 hours.  Urinalysis: No results for input(s): COLORURINE, LABSPEC, PHURINE, GLUCOSEU, HGBUR, BILIRUBINUR, KETONESUR, PROTEINUR, UROBILINOGEN, NITRITE, LEUKOCYTESUR in the last 72 hours.  Invalid input(s): APPERANCEUR    Imaging: No results found.   Medications:    . sodium chloride   Intravenous Once  . amiodarone  100 mg Oral Daily  . calcitRIOL  0.25 mcg Oral Daily  . feeding supplement (NEPRO CARB STEADY)  237 mL Oral BID BM  . fluticasone furoate-vilanterol  1 puff Inhalation Daily   . folic  acid  1 mg Oral Daily  . morphine  15 mg Oral Once  . pantoprazole  40 mg Oral Daily  . senna-docusate  1 tablet Oral BID  . sodium bicarbonate  650 mg Oral BID  . sodium chloride flush  10-40 mL Intracatheter Q12H   morphine, ondansetron **OR** ondansetron (ZOFRAN) IV, polyethylene glycol, sodium chloride flush  Assessment/ Plan:  1. CKD5 with AMS/Asterixus: s/p HD x1 12/15 for suspected uremia. No asterixus today and AAO x3. Not sure needs dialysis despite advanced renal failure  1. Follows with [facility] 2. At near baseline SCr  2. Sickle cell disease 3. Anemia - Hb 5's, defer transfusion decision to Dr Zigmund Daniel in sickle cell pt 4. COPD 5. Hx CHF - no extra vol on exam 6. Leukocytosis: U Cx with low level bacteruria and B Cx NGTD; On ceftriaxone.   Plan -  Watch for another 24h with labs  Either will be HD dependent and will obtain [facility] or return to baseline; favorable she will not need HD moving forward. She had a fistula that is not working right now  Discussed with husband  - the knee aspiration was consistent with gout  Will defer to Dr Zigmund Daniel  Trms;  LOS: 7 Jane Short W @TODAY @1 :47 PM

## 2016-05-21 DIAGNOSIS — M48061 Spinal stenosis, lumbar region without neurogenic claudication: Secondary | ICD-10-CM

## 2016-05-21 LAB — RENAL FUNCTION PANEL
ANION GAP: 11 (ref 5–15)
Albumin: 2.3 g/dL — ABNORMAL LOW (ref 3.5–5.0)
BUN: 65 mg/dL — ABNORMAL HIGH (ref 6–20)
CHLORIDE: 100 mmol/L — AB (ref 101–111)
CO2: 26 mmol/L (ref 22–32)
Calcium: 10.7 mg/dL — ABNORMAL HIGH (ref 8.9–10.3)
Creatinine, Ser: 3.17 mg/dL — ABNORMAL HIGH (ref 0.44–1.00)
GFR calc non Af Amer: 14 mL/min — ABNORMAL LOW (ref >60)
GFR, EST AFRICAN AMERICAN: 17 mL/min — AB (ref >60)
Glucose, Bld: 129 mg/dL — ABNORMAL HIGH (ref 65–99)
POTASSIUM: 4.3 mmol/L (ref 3.5–5.1)
Phosphorus: 4.1 mg/dL (ref 2.5–4.6)
Sodium: 137 mmol/L (ref 135–145)

## 2016-05-21 LAB — URIC ACID: Uric Acid, Serum: 9.7 mg/dL — ABNORMAL HIGH (ref 2.3–6.6)

## 2016-05-21 MED ORDER — PREDNISONE 10 MG PO TABS: 40.0000 mg | ORAL_TABLET | Freq: Every day | ORAL | 0 refills | Status: DC

## 2016-05-21 MED ORDER — PREDNISONE 10 MG PO TABS: 30.0000 mg | ORAL_TABLET | Freq: Every day | ORAL | 0 refills | Status: DC

## 2016-05-21 MED ORDER — MORPHINE SULFATE 15 MG PO TABS: 15.0000 mg | ORAL_TABLET | Freq: Two times a day (BID) | ORAL | 0 refills | Status: DC | PRN

## 2016-05-21 MED ORDER — PREDNISONE 10 MG PO TABS
20.0000 mg | ORAL_TABLET | Freq: Every day | ORAL | 0 refills | Status: DC
Start: 2016-05-29 — End: 2016-05-21

## 2016-05-21 MED ORDER — HEPARIN SOD (PORK) LOCK FLUSH 100 UNIT/ML IV SOLN
500.0000 [IU] | INTRAVENOUS | Status: AC | PRN
  Administered 2016-05-21: 500 [IU]

## 2016-05-21 MED ORDER — PREDNISONE 10 MG PO TABS: 10.0000 mg | ORAL_TABLET | Freq: Every day | ORAL | 0 refills | Status: DC

## 2016-05-21 MED ORDER — SORBITOL 70 % SOLN
960.0000 mL | TOPICAL_OIL | Freq: Once | ORAL | Status: AC
  Administered 2016-05-21: 960 mL via RECTAL
  Filled 2016-05-21: qty 240

## 2016-05-21 MED ORDER — PREDNISONE 10 MG PO TABS: 60.0000 mg | ORAL_TABLET | Freq: Every day | ORAL | 0 refills | Status: DC

## 2016-05-21 MED ORDER — ALLOPURINOL 100 MG PO TABS: 100.0000 mg | ORAL_TABLET | Freq: Every day | ORAL | 1 refills | Status: AC

## 2016-05-21 MED ORDER — PREDNISONE 10 MG PO TABS: 50.0000 mg | ORAL_TABLET | Freq: Every day | ORAL | 0 refills | Status: DC

## 2016-05-21 MED ORDER — DARBEPOETIN ALFA 300 MCG/0.6ML IJ SOSY
300.0000 ug | PREFILLED_SYRINGE | Freq: Once | INTRAMUSCULAR | Status: AC
  Administered 2016-05-21: 300 ug via SUBCUTANEOUS
  Filled 2016-05-21: qty 0.6

## 2016-05-21 MED ORDER — ALLOPURINOL 100 MG PO TABS
100.0000 mg | ORAL_TABLET | Freq: Every day | ORAL | Status: DC
  Administered 2016-05-21: 100 mg via ORAL
  Filled 2016-05-21: qty 1

## 2016-05-21 MED ORDER — PREDNISONE 10 MG (21) PO TBPK: ORAL_TABLET | ORAL | 0 refills | Status: DC

## 2016-05-21 NOTE — Progress Notes (Signed)
Occupational Therapy Treatment Patient Details Name: Jane Short MRN: [id number] DOB: 01/23/1951 Today's Date: 05/21/2016    History of present illness Patient is a 65 yo female admitted 05/13/16 with Lt hip pain and anemia - sickle cell crisis.  Patient also with acute encephalopathy, AKI for HD, UTI, weakness.   PMH:  sickle cell disease, CKD, anemia, COPD, CHF, chronic pain, valvular heart disease, OSA.  Has home O2.   OT comments  Pt making good progress with functional goals and will d/c home now instead of SNF today or tomorrow  Follow Up Recommendations  Home health OT;Supervision - Intermittent    Equipment Recommendations  3 in 1 bedside commode;Tub/shower seat    Recommendations for Other Services      Precautions / Restrictions Precautions Precautions: Fall Restrictions Weight Bearing Restrictions: No       Mobility Bed Mobility Overal bed mobility: Needs Assistance Bed Mobility: Supine to Sit;Sit to Supine     Supine to sit: Supervision Sit to supine: Supervision   General bed mobility comments: safety only  Transfers Overall transfer level: Needs assistance Equipment used: Rolling walker (2 wheeled) Transfers: Sit to/from Stand Sit to Stand: Supervision         General transfer comment: slow and guarded with discomfort, increased time    Balance Overall balance assessment: Needs assistance Sitting-balance support: No upper extremity supported Sitting balance-Leahy Scale: Good     Standing balance support: Bilateral upper extremity supported Standing balance-Leahy Scale: Poor Standing balance comment: reliant on the RW                   ADL Overall ADL's : Needs assistance/impaired     Grooming: Wash/dry hands;Wash/dry face;Set up;Supervision/safety;Standing       Lower Body Bathing: Sit to/from stand;Minimal assistance;Sitting/lateral leans;With caregiver independent assisting       Lower Body Dressing: Minimal  assistance;Sit to/from stand;Sitting/lateral leans;With caregiver independent assisting   Toilet Transfer: Supervision/safety;RW;Ambulation Armed forces technical officer Details (indicate cue type and reason): guarded, slow pace Toileting- Clothing Manipulation and Hygiene: Supervision/safety;Sit to/from stand   Tub/ Shower Transfer: Supervision/safety;Grab bars;Ambulation;Rolling walker   Functional mobility during ADLs: Supervision/safety;Rolling walker                                    Cognition   Behavior During Therapy: WFL for tasks assessed/performed Overall Cognitive Status: Within Functional Limits for tasks assessed                       Extremity/Trunk Assessment  Upper Extremity Assessment Upper Extremity Assessment: Overall WFL for tasks assessed   Lower Extremity Assessment Lower Extremity Assessment: Defer to PT evaluation                    General Comments  Pt very pleasant and cooperative    Pertinent Vitals/ Pain       Pain Assessment: 0-10 Pain Score: 5  Faces Pain Scale: Hurts a little bit Pain Location: L foot Pain Descriptors / Indicators: Discomfort;Sore Pain Intervention(s): Monitored during session;Repositioned  Home Living Family/patient expects to be discharged to:: Private residence Living Arrangements: Spouse/significant other Available Help at Discharge: Family Type of Home: House Home Access: Stairs to enter Technical brewer of Steps: 5 Entrance Stairs-Rails: Right;Left;Can reach both Home Layout: One level     Bathroom Shower/Tub: Teacher, early years/pre: Standard     Home  Equipment: Gilford Rile - 2 wheels;Cane - single point;Wheelchair - manual          Prior Functioning/Environment Level of Independence: Independent with assistive device(s)        Comments: reports she ambulates using SPC in home and uses WC for long distances   Frequency  Min 3X/week        Progress Toward  Goals  OT Goals(current goals can now be found in the care plan section)     Acute Rehab OT Goals Patient Stated Goal: go home OT Goal Formulation: With patient/family  Plan Discharge plan needs to be updated                     End of Session Equipment Utilized During Treatment: Rolling walker   Activity Tolerance Patient tolerated treatment well   Patient Left in bed;with call bell/phone within reach;with family/visitor present   Nurse Communication          Time: [id number] OT Time Calculation (min): 24 min  Charges: OT General Charges $OT Visit: 1 Procedure OT Treatments $Self Care/Home Management : 8-22 mins $Therapeutic Activity: 8-22 mins  Britt Bottom 05/21/2016, 2:21 PM

## 2016-05-21 NOTE — Progress Notes (Signed)
Pt placed on Cpap tolerating well no issues to report.

## 2016-05-21 NOTE — Clinical Social Work Note (Signed)
CSW informed that patient will discharge home per MD. Home health services will be set-up by nurse case manager, [employer]  CSW signing off, however if any other SW needs prior to discharge, please advise.  Bensyn Bornemann [place], MSW, LCSW Licensed Clinical Social Worker [place] [phone]

## 2016-05-21 NOTE — Discharge Summary (Signed)
Jane Short MRN: [id number] DOB/AGE: 1951-05-24 65 y.o.  Admit date: 05/13/2016 Discharge date: 05/21/2016  Primary Care Physician:  Tommi Rumps, MD   Discharge Diagnoses:   Patient Active Problem List   Diagnosis Date Noted  . Spinal stenosis, lumbar region, without neurogenic claudication 05/20/2016  . CKD (chronic kidney disease) stage 5, GFR less than 15 ml/min (HCC) 05/13/2016  . Valvular heart disease 05/13/2016  . Renal dialysis device, implant, or graft complication 0000000  . Iron deficiency anemia 12/06/2015  . Anemia of chronic renal failure, stage 4 (severe) ([place]) 12/06/2015  . Chronic progressive renal failure 08/28/2015  . Atrial fibrillation ([place]) 07/30/2015  . Insomnia 07/30/2015  . H. pylori infection 07/30/2015  . Bilirubinemia 06/27/2015  . CHF (congestive heart failure) ([place]) 06/05/2015  . Elevated bilirubin   . Hernia, ventral 04/17/2015  . Lichen planus [id number]  . CKD (chronic kidney disease), stage IV ([place]) 01/17/2015  . Atherosclerosis of aorta ([place]) 01/10/2015  . Abnormal CXR 01/03/2015  . Bilateral low back pain without sciatica 01/03/2015  . Hb-SS disease without crisis ([place]) 10/17/2014  . Screening for colon cancer 08/04/2012  . Screening for breast cancer 08/04/2012  . Edema 11/19/2011  . Atrial myxoma 07/23/2011    DISCHARGE MEDICATION: Allergies as of 05/21/2016      Reactions   Ramipril    cough   Septra [sulfamethoxazole-trimethoprim]    Joint pain      Medication List    STOP taking these medications   furosemide 80 MG tablet Commonly known as:  LASIX   hydroxyurea 500 MG capsule Commonly known as:  HYDREA     TAKE these medications   allopurinol 100 MG tablet Commonly known as:  ZYLOPRIM Take 1 tablet (100 mg total) by mouth daily.   amiodarone 100 MG tablet Commonly known as:  PACERONE Take 100 mg by mouth daily.   calcitRIOL 0.25 MCG capsule Commonly known as:  ROCALTROL Take 0.25 mcg by mouth  daily.   epoetin alfa 40000 UNIT/ML injection Commonly known as:  EPOGEN,PROCRIT Inject into the skin 3 (three) times a week.   feeding supplement (NEPRO CARB STEADY) Liqd Take 237 mLs by mouth 2 (two) times daily between meals.   fluticasone furoate-vilanterol 200-25 MCG/INH Aepb Commonly known as:  BREO ELLIPTA Inhale 1 puff into the lungs daily.   folic acid 1 MG tablet Commonly known as:  FOLVITE Take 1 mg by mouth daily.   morphine 15 MG tablet Commonly known as:  MSIR Take 1 tablet (15 mg total) by mouth every 12 (twelve) hours as needed for severe pain.   OXYGEN Inhale into the lungs.   pantoprazole 40 MG tablet Commonly known as:  PROTONIX Take 1 tablet (40 mg total) by mouth daily. Take until Prednisone completed   predniSONE 10 MG tablet Commonly known as:  DELTASONE Take 5 tablets (50 mg total) by mouth daily with breakfast.   predniSONE 10 MG tablet Commonly known as:  DELTASONE Take 6 tablets (60 mg total) by mouth daily with breakfast. Start taking on:  05/22/2016   predniSONE 10 MG tablet Commonly known as:  DELTASONE Take 4 tablets (40 mg total) by mouth daily with breakfast. Start taking on:  05/25/2016   predniSONE 10 MG tablet Commonly known as:  DELTASONE Take 3 tablets (30 mg total) by mouth daily with breakfast. Start taking on:  05/27/2016   predniSONE 10 MG tablet Commonly known as:  DELTASONE Take 2 tablets (20 mg total) by mouth daily with  breakfast. Start taking on:  05/29/2016   predniSONE 10 MG tablet Commonly known as:  DELTASONE Take 1 tablet (10 mg total) by mouth daily with breakfast. Start taking on:  05/31/2016   sodium bicarbonate 650 MG tablet Take 650 mg by mouth 2 (two) times daily.            Durable Medical Equipment        Start     Ordered   05/21/16 1228  For home use only DME Bedside commode  Once    Question Answer Comment  Patient needs a bedside commode to treat with the following condition Gout  attack   Patient needs a bedside commode to treat with the following condition CKD (chronic kidney disease), stage IV ([place])   Patient needs a bedside commode to treat with the following condition Gait abnormality   Patient needs a bedside commode to treat with the following condition Generalized weakness   Patient needs a bedside commode to treat with the following condition Degenerative joint disease (DJD) of lumbar spine   Patient needs a bedside commode to treat with the following condition Anemia of chronic disease      05/21/16 1228        Consults: Treatment Team:  Roney Jaffe, MD   SIGNIFICANT DIAGNOSTIC STUDIES:  Ct Abdomen Pelvis Wo Contrast  Result Date: 05/14/2016 CLINICAL DATA:  65 y/o F; decreasing hemoglobin with concern for retroperitoneal hemorrhage or hemorrhage at the painful left hip. History of sickle cell disease and cholecystectomy. EXAM: CT ABDOMEN AND PELVIS WITHOUT CONTRAST TECHNIQUE: Multidetector CT imaging of the abdomen and pelvis was performed following the standard protocol without IV contrast. COMPARISON:  05/17/2015 CT abdomen and pelvis. FINDINGS: Lower chest: Severe cardiomegaly. Decreased attenuation of cardiac blood pool compatible with anemia. Hepatobiliary: No focal liver abnormality is seen. Status post cholecystectomy. No biliary dilatation. Pancreas: Unremarkable. No pancreatic ductal dilatation or surrounding inflammatory changes. Spleen: Status post splenectomy. Adrenals/Urinary Tract: Adrenal glands are unremarkable. Stable left kidney interpolar 26 mm cyst. Kidneys are otherwise normal, without renal calculi, focal lesion, or hydronephrosis. Bladder is unremarkable. Stomach/Bowel: Stomach is within normal limits. Appendix appears normal. No evidence of bowel wall thickening, distention, or inflammatory changes. Few scattered diverticulum of the colon. Vascular/Lymphatic: Aortic atherosclerosis with moderate calcifications. No enlarged abdominal or  pelvic lymph nodes. Reproductive: Uterus and bilateral adnexa are unremarkable. Other: No abdominal wall hernia or abnormality. No abdominopelvic ascites. Musculoskeletal: Mixed lucent and sclerotic foci in the right femoral head, left proximal femur, bilateral ilium, and small lucencies in vertebral bodies probably representing multiple bone infarcts given history of sickle cell disease. IMPRESSION: 1. No hemorrhage identified. 2. Severe cardiomegaly. 3. Aortic atherosclerosis. 4. Bony stigmata of sickle cell disease with interval right femoral head avascular necrosis. Electronically Signed   By: Kristine Garbe M.D.   On: 05/14/2016 05:18   Dg Chest 1 View  Result Date: 05/13/2016 CLINICAL DATA:  Hypoxia, history sickle cell anemia, COPD EXAM: CHEST 1 VIEW COMPARISON:  Portable exam 1550 hours compared 06/22/2015 FINDINGS: LEFT jugular Port-A-Cath with tip projecting over SVC. Enlargement of cardiac silhouette with pulmonary vascular congestion. Aortic atherosclerosis. Lungs grossly clear. No definite infiltrate, pleural effusion or pneumothorax. No acute osseous findings. IMPRESSION: Enlargement of cardiac silhouette with pulmonary vascular congestion. No acute abnormalities. Electronically Signed   By: Lavonia Dana M.D.   On: 05/13/2016 16:05   Dg Knee 1-2 Views Left  Result Date: 05/18/2016 CLINICAL DATA:  Left knee pain. EXAM: LEFT KNEE -  1-2 VIEW COMPARISON:  None. FINDINGS: No acute fracture or dislocation. Osteochondral lesion involving the patella. Large joint effusion. Severe osteopenia. IMPRESSION: Osteochondral lesion of the patella.  Large joint effusion. Electronically Signed   By: Kathreen Devoid   On: 05/18/2016 11:15   Ct Head Wo Contrast  Result Date: 05/14/2016 CLINICAL DATA:  65 year old female with acute encephalopathy. The patient is lethargic, disoriented, and generalized weakness. EXAM: CT HEAD WITHOUT CONTRAST TECHNIQUE: Contiguous axial images were obtained from the  base of the skull through the vertex without intravenous contrast. COMPARISON:  None. FINDINGS: Brain: The ventricles and sulci are appropriate in size for patient's age. Mild periventricular and deep white matter chronic microvascular ischemic changes noted. There is no acute intracranial hemorrhage. No mass effect or midline shift noted. Vascular: No hyperdense vessel or unexpected calcification. Skull: There is diffuse ground-glass appearance of the skull with "Salt and pepper" appearance may be related to underlying hyperparathyroidism. Multiple lucent lesions within the skull may represent brown tumor of the bone secondary to hyperparathyroidism. Correlation with clinical exam and parathyroid function tests recommended. Sinuses/Orbits: No acute finding. Other: None IMPRESSION: No acute intracranial pathology. Mild age-related atrophy and chronic microvascular ischemic changes. Ground-glass appearance of the skull suggestive of hyperparathyroidism. Correlation with clinical exam and blood calcium levels recommended. Electronically Signed   By: Anner Crete M.D.   On: 05/14/2016 05:17   Mr Lumbar Spine Wo Contrast  Result Date: 05/14/2016 CLINICAL DATA:  Back pain at L4-5.  History of sickle cell anemia. EXAM: MRI LUMBAR SPINE WITHOUT CONTRAST TECHNIQUE: Multiplanar, multisequence MR imaging of the lumbar spine was performed. No intravenous contrast was administered. Patient was unable to complete examination due to pain, axial T1 sequence not obtained. COMPARISON:  CT abdomen and pelvis May 14, 2016 at 0432 hours FINDINGS: Mild motion degraded examination. SEGMENTATION: For the purposes of this report, the last well-formed intervertebral disc will be described as L5-S1. ALIGNMENT: Maintenance of the lumbar lordosis. Minimal grade 1 L4-5 anterolisthesis without spondylolysis. VERTEBRAE:Markedly decreased bone marrow signal on all sequences compatible with bone marrow replacement/reactivation.  Vertebral bodies are intact. Mild L4-5 disc height loss, with slight desiccation wall this suspected. Multilevel mild chronic discogenic endplate changes. Focal bright T1 and bright T2 signal in the LEFT ventral L5 vertebral body most compatible with hemangioma. CONUS MEDULLARIS: Conus medullaris terminates at L1-2 and demonstrates normal morphology and signal characteristics. Limited assessment of cauda equina due to mild patient motion and lack of T1 sequence. PARASPINAL AND SOFT TISSUES: Included prevertebral and paraspinal soft tissues are nonsuspicious, moderate symmetric paraspinal muscle atrophy. Renal cysts better seen on today's CT abdomen and pelvis. DISC LEVELS: L1-2 and L2-3: Potential annular fissures without significant disc bulge, canal stenosis or neural foraminal narrowing. L3-4: Small LEFT extraforaminal disc protrusion could affect the exited LEFT L4 nerve. Mild facet arthropathy and ligamentum flavum redundancy. Mild RIGHT, mild to moderate LEFT neural foraminal narrowing. L4-5: Widened facets to 5 mm with RIGHT greater than LEFT moderate facet arthropathy. Moderate broad-based disc bulge asymmetric to the RIGHT encroaches upon the exited RIGHT L4 nerve. Mild canal stenosis. Moderate to severe RIGHT, mild LEFT neural foraminal narrowing. L5-S1: Small broad-based disc bulge. Mild facet arthropathy and ligamentum flavum redundancy without canal stenosis. Mild RIGHT neural foraminal narrowing. IMPRESSION: Abnormally decreased bone marrow signal attributed to patient's sickle cell anemia though can be seen with other myeloproliferative disorders. Moderate L4-5 facet arthropathy, widened facets associated inflammatory changes and dynamic instability. Minimal grade 1 L4-5 anterolisthesis. This would be  better characterized on flexion extension radiographs. Mild canal stenosis L4-5. Neural foraminal narrowing L3-4 through L5-S1: Moderate to severe at L4-5. Electronically Signed   By: Elon Alas  M.D.   On: 05/14/2016 22:42   Dg Hip Unilat With Pelvis 2-3 Views Left  Result Date: 05/11/2016 CLINICAL DATA:  Hip pain. EXAM: DG HIP (WITH OR WITHOUT PELVIS) 2-3V LEFT COMPARISON:  None. FINDINGS: There is no evidence of hip fracture or dislocation. There is no evidence of arthropathy or other focal bone abnormality. IMPRESSION: Normal left hip. Electronically Signed   By: Marijo Conception, M.D.   On: 05/11/2016 15:59     ECHO:   Left ventricle: The cavity size was normal. Wall thickness was   increased in a pattern of moderate LVH. - Aortic valve: There was mild regurgitation. - Mitral valve: Calcified annulus. Mildly thickened leaflets .   There was mild regurgitation. - Left atrium: There is a 30 by 36 mm echobright mass in inferior   portion of LA, near posterior portion of mitral annulus. It is   unchanged in size from echo of Jan 2017 The atrium was severely   dilated. - Right ventricle: The cavity size was moderately dilated. Systolic   function was moderately to severely reduced. - Right atrium: The atrium was severely dilated. - Tricuspid valve: There was moderate regurgitation. - Pulmonary arteries: PA peak pressure: 91 mm Hg (S).  Impressions:  - Since echo from Jan 2017, there is no significant change in LA   mass. RVEF is slightly worse.   OTHER PROCEDURES: Dialysis x 1 episode  Recent Results (from the past 240 hour(s))  Culture, blood (Routine X 2) w Reflex to ID Panel     Status: None   Collection Time: 05/14/16  2:35 AM  Result Value Ref Range Status   Specimen Description BLOOD RIGHT ARM  Final   Special Requests IN PEDIATRIC BOTTLE 4CC  Final   Culture   Final    NO GROWTH 5 DAYS Performed at [facility]    Report Status 05/19/2016 FINAL  Final  Culture, blood (Routine X 2) w Reflex to ID Panel     Status: None   Collection Time: 05/14/16  2:40 AM  Result Value Ref Range Status   Specimen Description BLOOD RIGHT HAND  Final   Special  Requests BOTTLES DRAWN AEROBIC ONLY 5CC  Final   Culture   Final    NO GROWTH 5 DAYS Performed at [facility]    Report Status 05/19/2016 FINAL  Final  MRSA PCR Screening     Status: None   Collection Time: 05/14/16  3:57 AM  Result Value Ref Range Status   MRSA by PCR NEGATIVE NEGATIVE Final    Comment:        The GeneXpert MRSA Assay (FDA approved for NASAL specimens only), is one component of a comprehensive MRSA colonization surveillance program. It is not intended to diagnose MRSA infection nor to guide or monitor treatment for MRSA infections.   Culture, Urine     Status: Abnormal   Collection Time: 05/14/16  4:05 AM  Result Value Ref Range Status   Specimen Description URINE, CLEAN CATCH  Final   Special Requests NONE  Final   Culture 40,000 COLONIES/mL PROTEUS MIRABILIS (A)  Final   Report Status 05/16/2016 FINAL  Final   Organism ID, Bacteria PROTEUS MIRABILIS (A)  Final      Susceptibility   Proteus mirabilis - MIC*  AMPICILLIN <=2 SENSITIVE Sensitive     CEFAZOLIN <=4 SENSITIVE Sensitive     CEFTRIAXONE <=1 SENSITIVE Sensitive     CIPROFLOXACIN >=4 RESISTANT Resistant     GENTAMICIN <=1 SENSITIVE Sensitive     IMIPENEM 1 SENSITIVE Sensitive     NITROFURANTOIN 128 RESISTANT Resistant     TRIMETH/SULFA >=320 RESISTANT Resistant     AMPICILLIN/SULBACTAM <=2 SENSITIVE Sensitive     PIP/TAZO <=4 SENSITIVE Sensitive     * 40,000 COLONIES/mL PROTEUS MIRABILIS  Culture, body fluid-bottle     Status: None (Preliminary result)   Collection Time: 05/19/16 12:05 PM  Result Value Ref Range Status   Specimen Description FLUID SYNOVIAL LEFT KNEE  Final   Special Requests NONE  Final   Culture NO GROWTH < 24 HOURS  Final   Report Status PENDING  Incomplete  Gram stain     Status: None   Collection Time: 05/19/16 12:05 PM  Result Value Ref Range Status   Specimen Description FLUID SYNOVIAL LEFT KNEE  Final   Special Requests NONE  Final   Gram Stain   Final     ABUNDANT WBC PRESENT, PREDOMINANTLY PMN NO ORGANISMS SEEN    Report Status 05/19/2016 FINAL  Final    BRIEF ADMITTING H & P: Pt with stable Hb SS was admitted to [facility] from her physicians office for low Hb, AKI and pain. At [facility] she was transfused with 2 units RBC and thought to have a sickle cell crisis and was thus transferred to [facility]. However once at [facility] it was assessed that  pt did not have a sickle cell crisis but instead was encephalopathic due to uremic state and  had pain associated with lumbar DJD and gout of left knee. Hospital course as below.    Hospital Course:  Hospital Course By Problem: 1. 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 Additionally she had poor oral intake and was taking Aleve for pain in addition to continuing the lasix pre hospital. This all led to a state of acute kidney injury in the setting of CKD IV-V. Patient became uremic with asterixis, hyperkalemia and altered mental status. She received 1 episode of dialysis without any removal of fluid and had a dramatic improvement in symptoms as well as renal function. She was followed by Nephrologists who did not feel that she required any further dialysis at this time. Oral bicarbonate as well as Rocaltrol were continued throughout hospital course. Lasix was held during hospitalization and is discontinued at time of discharge. Pt reports her dry weight as 167#'s but a review of her out patient records show that weights recorded in the office over several months are consistent wit weight here in the hospital. Her weight at time of discharge is 83.5 kg (184# 1.4oz). Lasix should be used judiciously based on increase in body fluids as reflected in weights.   2. Hyperkalemia: Resolved with dialysis. Potassium 4.2 at time of discharge.  3. Acute Gout of Left knee: Pt had swelling in the left knee without any warmth or erythema. Orthopedic surgery was consulted and  performed an arthrocentesis which showed findings consistent with gout (Monosodium Urate Crystals). She was started on steroids and is discharged on asteroid taper over 10 days. 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  4. Acute hemolysis  in setting of anemia of chronic disease: Pt received a total of 3 units of RBC's (2 units at [facility] and 1 unit at [facility]). The etiology of this is unclear. My assessment is that the acute gout triggered a minot sickle cell crisis leading to acute hemolysis. However the etiology of her chronic anemia is altogether unclear as agreed upon by her Hematologist in the ambulatory setting. She is maintained in the ambulatory setting on Epogen  40,000 units TIW. Sh received our formulary substitute of Aranesp 300 mcg on 12/14 and 12/21. Patient has also been receiving FERAHEME 510 mg on a monthly basis at [facility] [place]. Pt received a dose of FERAHEME on 05/19/2016. At the time of discharge her Hb was 7.5 g/dL.  5. UTI: Urine culture revealed 40,000 colonies of Proteus Mirabilis which was treated with 3 days of Rocephin since patient was symptomatic.  6. Acute Back Pain: Pt had back pain and paraspinal tenderness noted on examination. An MRI of the lumbar spine revealed findings of DJD and Spondylolysis. Recommendations are for follow up with Orthopedic back surgery or Neurosurgery as an out patient. 7. Elevated BNP: This was uninterrreptable in a setting of renal failure and valvular heart disease 8. Constipation: Pt was given several laxatives without any results. She subsequently had a SMOG enema with good results.   Chronic Conditions: Stable during hospitalization 1. Valvular  Heart Disease: ECHO was performed which showed stable findings as compared to previous ECHO. 2. Hb SS without Crisis: Pt was initially suspected as  having a Sickle Cell Crisis when in fact she didn't. She had minimal requirements for analgesia except at it pertained to her acute gout. She had been on a sub-Therapeutic dose of Hydrea (500 mg) and  there is no known benefit to sub-therapeutic doses of Hydrea in Sickle Cell Disease. It was also felt that this dose could in fact contribute to bone marrow suppression. So in light of the above Hydrea was discontinued (Discussed with Dr. Burlene Arnt- Hematologist). 3. 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 However given the degree of ongoing hemolysis, it is likely related to her Sickle Cell Disease. 4. Elevated Bilirubin: PT has a chronically elevated bilirubin that is felt to be a consequence of both RBC hemolysis and hepatic congestion as shown in her liver biopsy from 07/2015. 5. 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 Surgery was considered and decided against. Chronic anticoagulation was also considered and dismissed after discussion with patient and her husband.  6. 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 Per patient lesion is stable.    Disposition and Follow-up: Pt discharged home in stable condition and will have home health care PT and OT follow up. Also home health RN for new medications. SH eis to follow up with Dr. Burlene Arnt, and Dr. Rolly Salter within 1 week.    Discharge Instructions    Diet renal [id number]    Complete by:  As directed    Do not drink in excess of 2000 ml /day.   Face-to-face encounter (required for Medicare/Medicaid patients)    Complete by:  As directed    I MATTHEWS,MICHELLE A. certify that this patient is under my care and that I, or a nurse practitioner or physician's assistant working with me, had a face-to-face encounter that meets the physician face-to-face encounter requirements with this patient on 05/21/2016. The encounter  with the patient was  in whole, or in part for the following medical condition(s) which is the primary reason for home health care (List medical condition): Generalized weakness after prolonged hospitalization, Acute episode of gout, Acute Kidney Injury on CKD IV( Resolved post dialysis), DJD lumbar spine, Acute hemolytic anemia.   The encounter with the patient was in whole, or in part, for the following medical condition, which is the primary reason for home health care:  gait abnormality, generalized weakness after prolonged hospitalization.   I certify that, based on my findings, the following services are medically necessary home health services:  Physical therapy   Reason for Medically Necessary Home Health Services:   Therapy- Personnel officer, Public librarian Therapy- Instruction on use of Assistive Device for Ambulation on all Surfaces     My clinical findings support the need for the above services:   Unable to leave home safely without assistance and/or assistive device Unsafe ambulation due to balance issues Pain interferes with ambulation/mobility     Further, I certify that my clinical findings support that this patient is homebound due to:   Ambulates short distances less than 300 feet Unable to leave home safely without assistance Pain interferes with ambulation/mobility     Home Health    Complete by:  As directed    To provide the following care/treatments:   PT OT     Increase activity slowly    Complete by:  As directed       DISCHARGE EXAM:  General: Alert, awake, oriented x3, in no apparent distress. Well appearing. HEENT: [place]/AT PEERL, EOMI, Icteric Neck: Trachea midline, no masses, no thyromegal,y no JVD, no carotid bruit OROPHARYNX: Moist, No exudate/ erythema/lesions.  Heart: Regular rate and rhythm, without murmurs, rubs, gallops or S3. PMI non-displaced. Exam reveals no decreased pulses. Pulmonary/Chest: Normal effort. Breath sounds normal. No. Apnea. Clear to  auscultation,no stridor,  no wheezing and no rhonchi noted. No respiratory distress and no tenderness noted. Abdomen: Soft, nontender, nondistended, normal bowel sounds, no masses no hepatosplenomegaly noted. No fluid wave and no ascites. There is no guarding or rebound. Neuro: Alert and oriented to person, place and time. Normal motor skills, Displays no atrophy or tremors and exhibits normal muscle tone.  No focal neurological deficits noted cranial nerves II through XII grossly intact. No sensory deficit noted. Strength at baseline in bilateral upper and lower extremities. Gait normal. Musculoskeletal: No warmth swelling or erythema around joints, no spinal tenderness noted. Psychiatric: Patient alert and oriented x3, good insight and cognition, good recent to remote recall. Mood, affect and judgement normal Lymph node survey: No cervical axillary or inguinal lymphadenopathy noted. Skin: Skin is warm and dry. No bruising, no ecchymosis and no rash noted. Pt is not diaphoretic. No erythema. No pallor. Pt has several well healed lichen planus lesions on BLE's.    Blood pressure 115/60, pulse (!) 53, temperature 98.2 F (36.8 C), temperature source Oral, resp. rate 16, height 5\' 5"  (1.651 m), weight 87 kg (191 lb 12.8 oz), SpO2 96 %.   Recent Labs  05/20/16 0440 05/21/16 0443  NA 136 137  K 4.2 4.3  CL 100* 100*  CO2 26 26  GLUCOSE 79 129*  BUN 68* 65*  CREATININE 3.42* 3.17*  CALCIUM 10.3 10.7*  PHOS 4.4 4.1    Recent Labs  05/20/16 0440 05/21/16 0443  ALBUMIN 2.3* 2.3*   No results for input(s): LIPASE, AMYLASE in the last 72 hours.  Recent Labs  05/19/16 0418 05/20/16 1000  WBC 16.0* 12.9*  NEUTROABS 13.0* 12.1*  HGB 4.9* 7.5*  HCT 15.0* 22.7*  MCV 89.3 89.4  PLT 288 342     Total time spent including face to face and decision making was greater than 30 minutes  Signed: MATTHEWS,MICHELLE A. 05/21/2016, 12:43 PM

## 2016-05-21 NOTE — Progress Notes (Signed)
Physical Therapy Treatment Patient Details Name: Jane Short MRN: [id number] DOB: [birth year] Today's Date: 05/21/2016    History of Present Illness Patient is a 65 yo female admitted 05/13/16 with Lt hip pain and anemia - sickle cell crisis.  Patient also with acute encephalopathy, AKI for HD, UTI, weakness.   PMH:  sickle cell disease, CKD, anemia, COPD, CHF, chronic pain, valvular heart disease, OSA.  Has home O2.    PT Comments    Now that gouty pain decreasing, pt doing much better.  Pt and family member feel they can manage well at home with recommendations below.  Follow Up Recommendations  Home health PT     Equipment Recommendations  3in1 (PT)    Recommendations for Other Services       Precautions / Restrictions Precautions Precautions: Fall    Mobility  Bed Mobility Overal bed mobility: Needs Assistance Bed Mobility: Supine to Sit;Sit to Supine     Supine to sit: Supervision Sit to supine: Supervision   General bed mobility comments: safety only  Transfers Overall transfer level: Needs assistance Equipment used: Rolling walker (2 wheeled);None Transfers: Sit to/from Stand Sit to Stand: Supervision         General transfer comment: slow and guarded with discomfort  Ambulation/Gait Ambulation/Gait assistance: Min guard Ambulation Distance (Feet): 12 Feet Assistive device: Rolling walker (2 wheeled) Gait Pattern/deviations: Step-through pattern Gait velocity: slower Gait velocity interpretation: Below normal speed for age/gender General Gait Details: antalgic, but steady   Stairs            Wheelchair Mobility    Modified Rankin (Stroke Patients Only)       Balance Overall balance assessment: Needs assistance Sitting-balance support: No upper extremity supported Sitting balance-Leahy Scale: Good     Standing balance support: Bilateral upper extremity supported Standing balance-Leahy Scale: Poor Standing balance comment:  reliant on the RW                    Cognition Arousal/Alertness: Awake/alert Behavior During Therapy: WFL for tasks assessed/performed;Flat affect Overall Cognitive Status: Within Functional Limits for tasks assessed                      Exercises      General Comments        Pertinent Vitals/Pain Pain Assessment: Faces Faces Pain Scale: Hurts a little bit Pain Location: L foot pain Pain Descriptors / Indicators: Discomfort Pain Intervention(s): Monitored during session    Home Living                      Prior Function            PT Goals (current goals can now be found in the care plan section) Acute Rehab PT Goals Patient Stated Goal: To decrease pain PT Goal Formulation: With patient/family Time For Goal Achievement: 05/31/16 Potential to Achieve Goals: Good Progress towards PT goals: Progressing toward goals    Frequency    Min 3X/week      PT Plan Discharge plan needs to be updated    Co-evaluation             End of Session   Activity Tolerance: Patient limited by pain Patient left: with call bell/phone within reach;in bed;with family/visitor present     Time: 1218-1229 PT Time Calculation (min) (ACUTE ONLY): 11 min  Charges:  $Gait Training: 8-22 mins  G CodesTessie Fass Lorena Clearman 05/21/2016, 1:55 PM  05/21/2016  Donnella Sham, [place] [phone]  (pager)

## 2016-05-21 NOTE — Progress Notes (Signed)
Jane Short to be D/C'd Home per MD order.  Discussed prescriptions and follow up appointments with the patient. Prescriptions given to patient, medication list explained in detail. Pt verbalized understanding.  Allergies as of 05/21/2016      Reactions   Ramipril    cough   Septra [sulfamethoxazole-trimethoprim]    Joint pain      Medication List    STOP taking these medications   furosemide 80 MG tablet Commonly known as:  LASIX   hydroxyurea 500 MG capsule Commonly known as:  HYDREA     TAKE these medications   allopurinol 100 MG tablet Commonly known as:  ZYLOPRIM Take 1 tablet (100 mg total) by mouth daily.   amiodarone 100 MG tablet Commonly known as:  PACERONE Take 100 mg by mouth daily.   calcitRIOL 0.25 MCG capsule Commonly known as:  ROCALTROL Take 0.25 mcg by mouth daily.   epoetin alfa 40000 UNIT/ML injection Commonly known as:  EPOGEN,PROCRIT Inject into the skin 3 (three) times a week.   feeding supplement (NEPRO CARB STEADY) Liqd Take 237 mLs by mouth 2 (two) times daily between meals.   fluticasone furoate-vilanterol 200-25 MCG/INH Aepb Commonly known as:  BREO ELLIPTA Inhale 1 puff into the lungs daily.   folic acid 1 MG tablet Commonly known as:  FOLVITE Take 1 mg by mouth daily.   morphine 15 MG tablet Commonly known as:  MSIR Take 1 tablet (15 mg total) by mouth every 12 (twelve) hours as needed for severe pain.   OXYGEN Inhale into the lungs.   pantoprazole 40 MG tablet Commonly known as:  PROTONIX Take 1 tablet (40 mg total) by mouth daily. Take until Prednisone completed   predniSONE 10 MG (21) Tbpk tablet Commonly known as:  STERAPRED UNI-PAK 21 TAB 60 mg po daily x 1, then 50 mg po daily x 2, then 40 mg po daily x 2, then 30 mg po daily x 2, then 20 mg po daily x 2, then 10 mg po daily x 2, then stop.   sodium bicarbonate 650 MG tablet Take 650 mg by mouth 2 (two) times daily.            Durable Medical Equipment         Start     Ordered   05/21/16 1248  For home use only DME 3 n 1  Once     05/21/16 1256   05/21/16 1247  For home use only DME Walker rolling  Once    Comments:  Pt ready to d/c, plan for SNF, much improved , will go home.  Question:  Patient needs a walker to treat with the following condition  Answer:  Weakness generalized   05/21/16 1256   05/21/16 1228  For home use only DME Bedside commode  Once    Question Answer Comment  Patient needs a bedside commode to treat with the following condition Gout attack   Patient needs a bedside commode to treat with the following condition CKD (chronic kidney disease), stage IV ([place])   Patient needs a bedside commode to treat with the following condition Gait abnormality   Patient needs a bedside commode to treat with the following condition Generalized weakness   Patient needs a bedside commode to treat with the following condition Degenerative joint disease (DJD) of lumbar spine   Patient needs a bedside commode to treat with the following condition Anemia of chronic disease      05/21/16 1228  Vitals:   05/21/16 0442 05/21/16 0851  BP: (!) 112/42 115/60  Pulse: 76 (!) 53  Resp: 17 16  Temp: 98 F (36.7 C) 98.2 F (36.8 C)    Skin clean, dry and intact without evidence of skin break down, no evidence of skin tears noted. IV catheter discontinued intact. Site without signs and symptoms of complications. Dressing and pressure applied. Pt denies pain at this time. No complaints noted.  An After Visit Summary was printed and given to the patient. Patient escorted via [place], and D/C home via private auto.  Dixie Dials RN, BSN

## 2016-05-21 NOTE — Care Management Note (Addendum)
Case Management Note  Patient Details  Name: Jane Short MRN: [id number] Date of Birth: 07-12-1950  Subjective/Objective:        CM following for progression and d/c planning.            Action/Plan: CM following and met with pt re d/c needs. This pt selected [facility] for [facility] and [facility]. [place] notified and DME ordered from [facility] for delivery to room.   Expected Discharge Date:    05/21/2016              Expected Discharge Plan:  [place]  In-House Referral:  NA  Discharge planning Services  CM Consult  Post Acute Care Choice:  Durable Medical Equipment, Home Health Choice offered to:  Patient  DME Arranged:  Bedside commode, Walker rolling DME Agency:  [place] Arranged:  PT, OT RN [facility] Agency:  [facility]  Status of Service:  Completed, signed off  If discussed at [place] of Stay Meetings, dates discussed:    Additional Comments:  Adron Bene, RN 05/21/2016, 12:59 PM

## 2016-05-22 ENCOUNTER — Inpatient Hospital Stay: Payer: Medicare Other

## 2016-05-22 ENCOUNTER — Telehealth: Payer: Self-pay

## 2016-05-22 LAB — TYPE AND SCREEN
BLOOD PRODUCT EXPIRATION DATE: 201712252359
Blood Product Expiration Date: 201712262359
ISSUE DATE / TIME: 201712120921
ISSUE DATE / TIME: 201712191144
UNIT TYPE AND RH: 9500
Unit Type and Rh: 9500

## 2016-05-22 NOTE — Telephone Encounter (Signed)
Transition Care Management Follow-up Telephone Call  How have you been since you were released from the hospital? Good day today, yesterday came home   Do you understand why you were in the hospital? Yes, acute gout flare up.    Do you understand the discharge instrcutions?   Items Reviewed:  Medications reviewed: took off lasix, Hydrxyrea, gave morphine, prednisone taper, allopourinol   Allergies reviewed: yes, no changes  Dietary changes reviewed: yes, no more then 2064mL liquids a day  Referrals reviewed: follow up with Dr. Juleen China and  Dr. Grayland Ormond   Functional Questionnaire:   Activities of Daily Living (ADLs):   She states they are independent in the following: independent States they require assistance with the following: no   Any transportation issues/concerns?: no concerns, husband will bring   Any patient concerns? No concerns at this time   Confirmed importance and date/time of follow-up visits scheduled: 12/28 at 1130am   Confirmed with patient if condition begins to worsen call PCP or go to the ER.  Patient was given the Call-a-Nurse line [phone]: yes, verbalized understanding

## 2016-05-24 LAB — CULTURE, BODY FLUID W GRAM STAIN -BOTTLE: Culture: NO GROWTH

## 2016-05-27 ENCOUNTER — Other Ambulatory Visit: Payer: Self-pay

## 2016-05-27 ENCOUNTER — Inpatient Hospital Stay (HOSPITAL_BASED_OUTPATIENT_CLINIC_OR_DEPARTMENT_OTHER): Payer: Medicare Other | Admitting: Oncology

## 2016-05-27 ENCOUNTER — Inpatient Hospital Stay: Payer: Medicare Other

## 2016-05-27 VITALS — BP 131/72 | HR 76 | Temp 99.0°F | Wt 191.0 lb

## 2016-05-27 DIAGNOSIS — D151 Benign neoplasm of heart: Secondary | ICD-10-CM | POA: Diagnosis not present

## 2016-05-27 DIAGNOSIS — Z803 Family history of malignant neoplasm of breast: Secondary | ICD-10-CM | POA: Diagnosis not present

## 2016-05-27 DIAGNOSIS — I4891 Unspecified atrial fibrillation: Secondary | ICD-10-CM | POA: Diagnosis not present

## 2016-05-27 DIAGNOSIS — D571 Sickle-cell disease without crisis: Secondary | ICD-10-CM

## 2016-05-27 DIAGNOSIS — M25552 Pain in left hip: Secondary | ICD-10-CM

## 2016-05-27 DIAGNOSIS — N189 Chronic kidney disease, unspecified: Secondary | ICD-10-CM | POA: Diagnosis not present

## 2016-05-27 DIAGNOSIS — J449 Chronic obstructive pulmonary disease, unspecified: Secondary | ICD-10-CM

## 2016-05-27 DIAGNOSIS — L439 Lichen planus, unspecified: Secondary | ICD-10-CM | POA: Diagnosis not present

## 2016-05-27 DIAGNOSIS — R0602 Shortness of breath: Secondary | ICD-10-CM | POA: Diagnosis not present

## 2016-05-27 DIAGNOSIS — N184 Chronic kidney disease, stage 4 (severe): Principal | ICD-10-CM

## 2016-05-27 DIAGNOSIS — I129 Hypertensive chronic kidney disease with stage 1 through stage 4 chronic kidney disease, or unspecified chronic kidney disease: Secondary | ICD-10-CM | POA: Diagnosis present

## 2016-05-27 DIAGNOSIS — Z9049 Acquired absence of other specified parts of digestive tract: Secondary | ICD-10-CM | POA: Diagnosis not present

## 2016-05-27 DIAGNOSIS — D696 Thrombocytopenia, unspecified: Secondary | ICD-10-CM | POA: Diagnosis not present

## 2016-05-27 DIAGNOSIS — K761 Chronic passive congestion of liver: Secondary | ICD-10-CM | POA: Diagnosis not present

## 2016-05-27 DIAGNOSIS — D631 Anemia in chronic kidney disease: Secondary | ICD-10-CM

## 2016-05-27 DIAGNOSIS — M109 Gout, unspecified: Secondary | ICD-10-CM | POA: Diagnosis not present

## 2016-05-27 DIAGNOSIS — E559 Vitamin D deficiency, unspecified: Secondary | ICD-10-CM | POA: Diagnosis not present

## 2016-05-27 DIAGNOSIS — Z79899 Other long term (current) drug therapy: Secondary | ICD-10-CM

## 2016-05-27 DIAGNOSIS — I509 Heart failure, unspecified: Secondary | ICD-10-CM | POA: Diagnosis not present

## 2016-05-27 DIAGNOSIS — D508 Other iron deficiency anemias: Secondary | ICD-10-CM

## 2016-05-27 DIAGNOSIS — M818 Other osteoporosis without current pathological fracture: Secondary | ICD-10-CM

## 2016-05-27 LAB — CBC WITH DIFFERENTIAL/PLATELET
BASOS PCT: 0 %
Basophils Absolute: 0 10*3/uL (ref 0–0.1)
Eosinophils Absolute: 0 10*3/uL (ref 0–0.7)
Eosinophils Relative: 0 %
HCT: 25.6 % — ABNORMAL LOW (ref 35.0–47.0)
HEMOGLOBIN: 8.1 g/dL — AB (ref 12.0–16.0)
Lymphocytes Relative: 1 %
Lymphs Abs: 0.2 10*3/uL — ABNORMAL LOW (ref 1.0–3.6)
MCH: 31.1 pg (ref 26.0–34.0)
MCHC: 31.6 g/dL — AB (ref 32.0–36.0)
MCV: 98.4 fL (ref 80.0–100.0)
MONO ABS: 1.8 10*3/uL — AB (ref 0.2–0.9)
MONOS PCT: 10 %
NEUTROS ABS: 16.7 10*3/uL — AB (ref 1.4–6.5)
Neutrophils Relative %: 89 %
Platelets: 285 10*3/uL (ref 150–440)
RBC: 2.6 MIL/uL — ABNORMAL LOW (ref 3.80–5.20)
RDW: 24.8 % — ABNORMAL HIGH (ref 11.5–14.5)
WBC: 18.7 10*3/uL — ABNORMAL HIGH (ref 3.6–11.0)

## 2016-05-27 MED ORDER — EPOETIN ALFA 40000 UNIT/ML IJ SOLN
40000.0000 [IU] | Freq: Once | INTRAMUSCULAR | Status: AC
  Administered 2016-05-27: 40000 [IU] via SUBCUTANEOUS
  Filled 2016-05-27: qty 1

## 2016-05-27 NOTE — Progress Notes (Signed)
Patient here today for Hospital follow up.  Need Rx for pantoprazole

## 2016-05-27 NOTE — Progress Notes (Signed)
[facility] OFFICE PROGRESS NOTE  Patient Care Team: Leone Haven, MD as PCP - General (Family Medicine) Seeplaputhur Robinette Haines, MD (General Surgery) Rubbie Battiest, NP as Nurse Practitioner (Gerontology) Flora Lipps, MD as Consulting Physician (Pulmonary Disease) Yolonda Kida, MD as Consulting Physician (Cardiology)   SUMMARY OF ONCOLOGIC HISTORY:  # Severe Anemia- multifactorial [as per Pt baseline 6-6.5] SCD/CKD- on procrit 40K/IV Iron; May 2017- Procrit 40K W-F; July 2017-Start IV Ferrahem q2W; SEP 2017- M-W-F    # SICKLE CELL ANEMIA/ Congestive hepatopathy [Dec 2016- Bili 23s s/p liver bx; [facility]]; May 2017- RestartHydrea 500 qD  # Acute renal failure/ CKD [Dr.Kolluru]/ CHF [Dr.]  # Jan 2017 ? Endocarditis s/p IV Abx/ A. Fib [not on anti-coag]   INTERVAL HISTORY:  65 year old female patient with above history of sickle cell anemia chronic kidney disease/CHF- is here for follow-up- hospitalization for severe anemia, AKI. Hgb was 4.9 (baseline 6) at office visit on 05/11/16. Patient admitted to hospital.  Patient received dialysis and 2 units pRBC during hospitalization, 1 unit after discharge at [facility].  Last Hgb 7.5 on 05/20/16.  She reports that her left hip pain moved to her left knee, then her left foot.  She complains of 4/10 left foot pain currently, on prednisone, and taking prn morphine once daily.   She also received 3 days of rocephin for a UTI while hospitalized. Now resolved.  She states that she feels much better, no shortness of breath.  She complains of continuing lower back and hip pain, being evaluated by PT at home tomorrow. No chest pain. No fevers. Denies any swelling in the legs. No headaches, nausea or vomiting. No diarrhea or constipation.  No other complaints at this time.  REVIEW OF SYSTEMS:  A complete 10 point review of system is done which is negative except mentioned above/history of present illness.   PAST MEDICAL HISTORY :  Past  Medical History:  Diagnosis Date  . Anemia   . 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 Cheree Ditto at [facility]  . CHF (congestive heart failure) ([place])   . Congestive heart failure (CHF) ([place])   . Congestive heart failure (CHF) ([place]) 06/2015  . COPD (chronic obstructive pulmonary disease) (HCC)    symptoms Dr. Patricia Pesa   . Gout   . Hypertension   . Lichen planus   . Osteoporosis 2011   osteopenia  . 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 Sickle cell anemia (HCC)    sickle cell disease  . Sickle cell anemia (HCC)   . Thrombocytopenia ([place])   . Vitamin D deficiency     PAST SURGICAL HISTORY :   Past Surgical History:  Procedure Laterality Date  . AV FISTULA PLACEMENT Left 02/27/2016   Procedure: INSERTION OF ARTERIOVENOUS (AV) GORE-TEX GRAFT ARM ( FOREARM LOOP );  Surgeon: Algernon Huxley, MD;  Location: [facility] ORS;  Service: Vascular;  Laterality: Left;  . CHOLECYSTECTOMY    . COLONOSCOPY  2014   [facility]  . EYE SURGERY     cataract bilateral  . GALLBLADDER SURGERY    . PERIPHERAL VASCULAR CATHETERIZATION Left 06/14/2015   Procedure: Dialysis/Perma Catheter Insertion;  Surgeon: Katha Cabal, MD;  Location: [facility] CV LAB;  Service: Cardiovascular;  Laterality: Left;  . PERIPHERAL VASCULAR CATHETERIZATION N/A 01/23/2016   Procedure: Glori Luis Cath Insertion;  Surgeon: Algernon Huxley, MD;  Location: [facility] CV LAB;  Service: Cardiovascular;  Laterality: N/A;  . PERIPHERAL  VASCULAR CATHETERIZATION Left 03/30/2016   Procedure: A/V Shuntogram/Fistulagram;  Surgeon: Algernon Huxley, MD;  Location: [facility] CV LAB;  Service: Cardiovascular;  Laterality: Left;  . TUBAL LIGATION      FAMILY HISTORY :   Family History  Problem Relation Age of Onset  . Heart disease Father   . Diabetes Father   . Diabetes Sister   . Cancer Mother     breast cancer  . Cancer Maternal Aunt     Breast cancer    SOCIAL HISTORY:   Social History  Substance Use Topics  . Smoking status:  Former Smoker    Packs/day: 0.25    Years: 30.00    Types: Cigarettes    Quit date: 04/29/2015  . Smokeless tobacco: Never Used     Comment: quit 1 month ago  . Alcohol use No    ALLERGIES:  is allergic to ramipril and septra [sulfamethoxazole-trimethoprim].  MEDICATIONS:  Current Outpatient Prescriptions  Medication Sig Dispense Refill  . allopurinol (ZYLOPRIM) 100 MG tablet Take 1 tablet (100 mg total) by mouth daily. 30 tablet 1  . amiodarone (PACERONE) 100 MG tablet Take 100 mg by mouth daily.    . calcitRIOL (ROCALTROL) 0.25 MCG capsule Take 0.25 mcg by mouth daily.    Marland Kitchen epoetin alfa (EPOGEN,PROCRIT) 09811 UNIT/ML injection Inject into the skin 3 (three) times a week.    . fluticasone furoate-vilanterol (BREO ELLIPTA) 200-25 MCG/INH AEPB Inhale 1 puff into the lungs daily. 60 each 5  . folic acid (FOLVITE) 1 MG tablet Take 1 mg by mouth daily.    Marland Kitchen morphine (MSIR) 15 MG tablet Take 1 tablet (15 mg total) by mouth every 12 (twelve) hours as needed for severe pain. 10 tablet 0  . Nutritional Supplements (FEEDING SUPPLEMENT, NEPRO CARB STEADY,) LIQD Take 237 mLs by mouth 2 (two) times daily between meals.  0  . OXYGEN Inhale into the lungs.    . predniSONE (STERAPRED UNI-PAK 21 TAB) 10 MG (21) TBPK tablet 60 mg po daily x 1, then 50 mg po daily x 2, then 40 mg po daily x 2, then 30 mg po daily x 2, then 20 mg po daily x 2, then 10 mg po daily x 2, then stop. 34 tablet 0  . sodium bicarbonate 650 MG tablet Take 650 mg by mouth 2 (two) times daily.     . pantoprazole (PROTONIX) 40 MG tablet Take 1 tablet (40 mg total) by mouth daily. Take until Prednisone completed     No current facility-administered medications for this visit.    Facility-Administered Medications Ordered in Other Visits  Medication Dose Route Frequency Provider Last Rate Last Dose  . 0.9 %  sodium chloride infusion  250 mL Intravenous Once Cammie Sickle, MD      . acetaminophen (TYLENOL) tablet 650 mg  650  mg Oral Once Cammie Sickle, MD      . acetaminophen (TYLENOL) tablet 650 mg  650 mg Oral Once Cammie Sickle, MD      . diphenhydrAMINE (BENADRYL) capsule 25 mg  25 mg Oral Once Cammie Sickle, MD      . diphenhydrAMINE (BENADRYL) capsule 25 mg  25 mg Oral Once Cammie Sickle, MD      . furosemide (LASIX) injection 20 mg  20 mg Intravenous Once Cammie Sickle, MD      . furosemide (LASIX) injection 20 mg  20 mg Intravenous Once Cammie Sickle, MD      .  heparin lock flush 100 unit/mL  250 Units Intracatheter PRN Leia Alf, MD      . heparin lock flush 100 unit/mL  500 Units Intracatheter Daily PRN Cammie Sickle, MD      . heparin lock flush 100 unit/mL  250 Units Intracatheter PRN Cammie Sickle, MD      . heparin lock flush 100 unit/mL  500 Units Intracatheter Daily PRN Cammie Sickle, MD      . heparin lock flush 100 unit/mL  250 Units Intracatheter PRN Cammie Sickle, MD      . sodium chloride flush (NS) 0.9 % injection 10 mL  10 mL Intracatheter PRN Cammie Sickle, MD      . sodium chloride flush (NS) 0.9 % injection 10 mL  10 mL Intracatheter PRN Cammie Sickle, MD      . sodium chloride flush (NS) 0.9 % injection 3 mL  3 mL Intracatheter PRN Cammie Sickle, MD      . sodium chloride flush (NS) 0.9 % injection 3 mL  3 mL Intracatheter PRN Cammie Sickle, MD        PHYSICAL EXAMINATION:   BP 131/72 (BP Location: Right Arm, Patient Position: Sitting)   Pulse 76   Temp 99 F (37.2 C) (Tympanic)   Wt 191 lb 0.5 oz (86.7 kg)   BMI 31.79 kg/m   Filed Weights   05/27/16 1044  Weight: 191 lb 0.5 oz (86.7 kg)    GENERAL: Well-nourished well-developed; no distress and comfortable.  Accompanied by her husband.  EYES:  Negative for pallor/negative for icterus. OROPHARYNX: no thrush or ulceration; good dentition  NECK: supple, no masses felt LYMPH:  no palpable lymphadenopathy in the cervical,  axillary or inguinal regions LUNGS: decreased breath sounds to auscultation. No wheeze or crackles HEART/CVS: regular rate & rhythm and no murmurs; No lower extremity edema ABDOMEN:abdomen soft, non-tender and normal bowel sounds Musculoskeletal:no cyanosis of digits and no clubbing; PSYCH: alert & oriented x 3 with fluent speech NEURO: no focal motor/sensory deficits SKIN: lichen planus lesions on the Legs bil [>15 years per pt]  LABORATORY DATA:  I have reviewed the data as listed    Component Value Date/Time   NA 137 05/21/2016 0443   NA 138 06/20/2012 1400   K 4.3 05/21/2016 0443   K 4.3 06/20/2012 1400   CL 100 (L) 05/21/2016 0443   CL 105 06/20/2012 1400   CO2 26 05/21/2016 0443   CO2 24 06/20/2012 1400   GLUCOSE 129 (H) 05/21/2016 0443   GLUCOSE 102 (H) 06/20/2012 1400   BUN 65 (H) 05/21/2016 0443   BUN 13 06/20/2012 1400   CREATININE 3.17 (H) 05/21/2016 0443   CREATININE 1.36 (H) 03/22/2013 1522   CALCIUM 10.7 (H) 05/21/2016 0443   CALCIUM 8.4 (L) 12/23/2015 1039   PROT 6.1 (L) 05/15/2016 0630   PROT 7.1 03/22/2013 1522   ALBUMIN 2.3 (L) 05/21/2016 0443   ALBUMIN 3.7 03/22/2013 1522   AST 27 05/15/2016 0630   AST 48 (H) 03/22/2013 1522   ALT 12 (L) 05/15/2016 0630   ALT 25 03/22/2013 1522   ALKPHOS 137 (H) 05/15/2016 0630   ALKPHOS 139 (H) 03/22/2013 1522   BILITOT 15.9 (H) 05/15/2016 0630   BILITOT 4.0 (H) 03/22/2013 1522   GFRNONAA 14 (L) 05/21/2016 0443   GFRNONAA 42 (L) 03/22/2013 1522   GFRAA 17 (L) 05/21/2016 0443   GFRAA 48 (L) 03/22/2013 1522    No results found for:  SPEP, UPEP  Lab Results  Component Value Date   WBC 18.7 (H) 05/27/2016   NEUTROABS 16.7 (H) 05/27/2016   HGB 8.1 (L) 05/27/2016   HCT 25.6 (L) 05/27/2016   MCV 98.4 05/27/2016   PLT 285 05/27/2016      Chemistry      Component Value Date/Time   NA 137 05/21/2016 0443   NA 138 06/20/2012 1400   K 4.3 05/21/2016 0443   K 4.3 06/20/2012 1400   CL 100 (L) 05/21/2016 0443    CL 105 06/20/2012 1400   CO2 26 05/21/2016 0443   CO2 24 06/20/2012 1400   BUN 65 (H) 05/21/2016 0443   BUN 13 06/20/2012 1400   CREATININE 3.17 (H) 05/21/2016 0443   CREATININE 1.36 (H) 03/22/2013 1522   GLU 89 02/20/2010      Component Value Date/Time   CALCIUM 10.7 (H) 05/21/2016 0443   CALCIUM 8.4 (L) 12/23/2015 1039   ALKPHOS 137 (H) 05/15/2016 0630   ALKPHOS 139 (H) 03/22/2013 1522   AST 27 05/15/2016 0630   AST 48 (H) 03/22/2013 1522   ALT 12 (L) 05/15/2016 0630   ALT 25 03/22/2013 1522   BILITOT 15.9 (H) 05/15/2016 0630   BILITOT 4.0 (H) 03/22/2013 1522         ASSESSMENT & PLAN:   No problem-specific Assessment & Plan notes found for this encounter.  1. SEVERE ANEMIA-Multifactorial chronic kidney disease/iron deficiency/sickle cell disease. Brisk bone marrow response.  Hgb 7.5 on 05/20/16 after receiving 3 units pRBCs during and after recent hospitalization.  2. Sickle cell disease- confirmed on hemoglobin electrophoresis. On folic acid once a day. On  Hydrea 500 mg once a day.  3. Left hip pain resolved, pain migrated to left foot.  On predisone and prn morphine.  4. Chronic kidney disease/ acute renal failure- received dialysis during recent hospitalization.  5. COPD/OSA/CHF [Dr.Kasa] on Home O2 -2L [place] prn  CPAP.  On room air today during office visit.  6. UTI, resolved.  7. WBC 18.7, ANC 16.7 today; pt asymptomatic, afebrile. Labs released to patient chart with note to call office if developing fever, increasing pain, or pain or burning with urination.  Monitor at next office visit in 3 weeks.  8. Treatment with Procrit today.  Follow up with Dr. Rogue Bussing in 3 weeks, labs, Procrit.     Lloyd Huger, MD 05/29/2016 10:59 AM

## 2016-05-28 ENCOUNTER — Encounter: Payer: Self-pay | Admitting: Family Medicine

## 2016-05-28 ENCOUNTER — Ambulatory Visit (INDEPENDENT_AMBULATORY_CARE_PROVIDER_SITE_OTHER): Payer: Medicare Other | Admitting: Family Medicine

## 2016-05-28 DIAGNOSIS — N184 Chronic kidney disease, stage 4 (severe): Secondary | ICD-10-CM | POA: Diagnosis not present

## 2016-05-28 DIAGNOSIS — N3000 Acute cystitis without hematuria: Secondary | ICD-10-CM

## 2016-05-28 DIAGNOSIS — M10362 Gout due to renal impairment, left knee: Secondary | ICD-10-CM | POA: Diagnosis not present

## 2016-05-28 DIAGNOSIS — D571 Sickle-cell disease without crisis: Secondary | ICD-10-CM | POA: Diagnosis not present

## 2016-05-28 NOTE — Patient Instructions (Signed)
Nice to meet you. I'm glad you're doing significant better. We will have you finish your prednisone and continue to take the morphine as needed. Please keep your follow-up with your kidney doctor. If you develop any pain, trouble breathing, cough productive of any mucus, confusion, or any new or changing symptoms please seek medical attention immediately.

## 2016-05-28 NOTE — Progress Notes (Signed)
Pre visit review using our clinic review tool, if applicable. No additional management support is needed unless otherwise documented below in the visit note. 

## 2016-05-29 ENCOUNTER — Inpatient Hospital Stay: Payer: Medicare Other

## 2016-05-29 VITALS — BP 150/70 | HR 101

## 2016-05-29 DIAGNOSIS — D631 Anemia in chronic kidney disease: Secondary | ICD-10-CM

## 2016-05-29 DIAGNOSIS — N390 Urinary tract infection, site not specified: Secondary | ICD-10-CM | POA: Insufficient documentation

## 2016-05-29 DIAGNOSIS — M109 Gout, unspecified: Secondary | ICD-10-CM | POA: Insufficient documentation

## 2016-05-29 DIAGNOSIS — N189 Chronic kidney disease, unspecified: Principal | ICD-10-CM

## 2016-05-29 DIAGNOSIS — I129 Hypertensive chronic kidney disease with stage 1 through stage 4 chronic kidney disease, or unspecified chronic kidney disease: Secondary | ICD-10-CM | POA: Diagnosis not present

## 2016-05-29 LAB — SAMPLE TO BLOOD BANK

## 2016-05-29 MED ORDER — EPOETIN ALFA 40000 UNIT/ML IJ SOLN
40000.0000 [IU] | Freq: Once | INTRAMUSCULAR | Status: AC
  Administered 2016-05-29: 40000 [IU] via SUBCUTANEOUS
  Filled 2016-05-29: qty 1

## 2016-05-29 NOTE — Assessment & Plan Note (Signed)
Most recent GFR 17. Had follow-up lab work through her nephrologist yesterday and will follow up with them next week. Discussed keeping that follow-up and monitoring for any recurrent symptoms of uremia

## 2016-05-29 NOTE — Assessment & Plan Note (Signed)
Patient with UTI in the hospital. Asymptomatic currently. She'll monitor for recurrent symptoms.

## 2016-05-29 NOTE — Assessment & Plan Note (Addendum)
Patient's left knee pain related to gout. Has significantly improved with prednisone. She'll finish her course of prednisone and monitor for any recurrence of discomfort. She will continue allopurinol as this was restarted in the hospital.

## 2016-05-29 NOTE — Assessment & Plan Note (Signed)
Patient with sickle cell anemia without crisis. No signs of crisis at this time. Hemoglobin yesterday was improved. She'll continue to follow with hematology for this. Discussed signs and symptoms of crisis and to monitor for any respiratory symptoms as well. Given return precautions.

## 2016-05-29 NOTE — Progress Notes (Signed)
Tommi Rumps, MD Phone: [phone]  Jane Short is a 65 y.o. female who presents today for hospital follow-up.  Patient was admitted for knee pain which initially was felt to be a sickle cell pain crisis though on further evaluation it ended up being related to gout. She was also found to be anemic and required 3 units of RBCs. She had improvement of her hemoglobin to 7.5 prior to discharge. She had drainage of the left knee which did reveal gout crystals and no infection. She was placed on a prednisone taper and has 3 days left. Also taking morphine about once a day. She notes no issues with her sickle cell at this time. She had follow-up with hematology yesterday and had a slightly improved hemoglobin. Physical therapy is given to evaluate her at her house in the next several days to help her strength in her legs. She was additionally treated for UTI and reports no dysuria or fevers or abdominal pain at this time. She did receive dialysis once due to acute kidney injury on chronic kidney disease and being confused from being in a uremic state. They did this through a temporary catheter. She reports she is doing well at this time. Notes her knee is a little bit tender though is much improved. She reports she sees nephrology next week and had blood drawn through them yesterday to follow-up on her kidney function. She does report she is always slightly jaundiced.  PMH: Former smoker   ROS see history of present illness  Objective  Physical Exam Vitals:   05/28/16 1128  BP: 130/68  Pulse: 68  Temp: 97.8 F (36.6 C)    BP Readings from Last 3 Encounters:  05/29/16 (!) 150/70  05/28/16 130/68  05/27/16 131/72   Wt Readings from Last 3 Encounters:  05/28/16 190 lb (86.2 kg)  05/27/16 191 lb 0.5 oz (86.7 kg)  05/20/16 191 lb 12.8 oz (87 kg)    Physical Exam  Constitutional: No distress.  HENT:  Head: Normocephalic and atraumatic.  Mouth/Throat: Oropharynx is clear and  moist. No oropharyngeal exudate.  Eyes: Pupils are equal, round, and reactive to light. Scleral icterus is present.  Cardiovascular: Normal rate, regular rhythm and normal heart sounds.   Pulmonary/Chest: Effort normal and breath sounds normal.  Abdominal: Soft. Bowel sounds are normal. She exhibits no distension. There is no tenderness. There is no rebound and no guarding.  Musculoskeletal: She exhibits no edema.  Left knee with no effusion, warmth, or erythema, there is minimal tenderness to the left knee, no ligament laxity, negative McMurray's, right knee with no effusion, warmth, erythema, or tenderness, no ligament laxity, negative McMurray's  Neurological: She is alert.  Slight limp favoring the left leg on gait  Skin: Skin is warm and dry. She is not diaphoretic.  Slight jaundice to the face     Assessment/Plan: Please see individual problem list.  CKD (chronic kidney disease), stage IV ([place]) Most recent GFR 17. Had follow-up lab work through her nephrologist yesterday and will follow up with them next week. 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 No signs of crisis at this time. Hemoglobin yesterday was improved. She'll continue to follow with hematology for this. Discussed signs and symptoms of crisis and to monitor for any respiratory symptoms as well. Given return precautions.  UTI (urinary tract infection) Patient with UTI in the hospital. Asymptomatic currently.  She'll monitor for recurrent symptoms.  Gout Patient's left knee pain related to gout. Has significantly improved with prednisone. She'll finish her course of prednisone and monitor for any recurrence of discomfort. She will continue allopurinol as this was restarted in the hospital.  Patient's medications were reviewed in full with her today.  Tommi Rumps, MD [place]

## 2016-06-03 ENCOUNTER — Inpatient Hospital Stay: Payer: Medicare Other

## 2016-06-03 ENCOUNTER — Other Ambulatory Visit: Payer: Self-pay

## 2016-06-03 ENCOUNTER — Inpatient Hospital Stay: Payer: Medicare Other | Attending: Internal Medicine

## 2016-06-03 ENCOUNTER — Other Ambulatory Visit: Payer: Self-pay | Admitting: *Deleted

## 2016-06-03 VITALS — BP 146/83 | HR 80

## 2016-06-03 DIAGNOSIS — N184 Chronic kidney disease, stage 4 (severe): Secondary | ICD-10-CM | POA: Insufficient documentation

## 2016-06-03 DIAGNOSIS — D631 Anemia in chronic kidney disease: Secondary | ICD-10-CM | POA: Insufficient documentation

## 2016-06-03 DIAGNOSIS — I129 Hypertensive chronic kidney disease with stage 1 through stage 4 chronic kidney disease, or unspecified chronic kidney disease: Secondary | ICD-10-CM | POA: Diagnosis present

## 2016-06-03 DIAGNOSIS — D571 Sickle-cell disease without crisis: Secondary | ICD-10-CM

## 2016-06-03 DIAGNOSIS — Z79899 Other long term (current) drug therapy: Secondary | ICD-10-CM | POA: Insufficient documentation

## 2016-06-03 DIAGNOSIS — N189 Chronic kidney disease, unspecified: Secondary | ICD-10-CM

## 2016-06-03 LAB — COMPREHENSIVE METABOLIC PANEL
ALBUMIN: 3.4 g/dL — AB (ref 3.5–5.0)
ALT: 55 U/L — ABNORMAL HIGH (ref 14–54)
AST: 54 U/L — AB (ref 15–41)
Alkaline Phosphatase: 175 U/L — ABNORMAL HIGH (ref 38–126)
Anion gap: 5 (ref 5–15)
BUN: 26 mg/dL — AB (ref 6–20)
CHLORIDE: 111 mmol/L (ref 101–111)
CO2: 22 mmol/L (ref 22–32)
Calcium: 9.2 mg/dL (ref 8.9–10.3)
Creatinine, Ser: 2.15 mg/dL — ABNORMAL HIGH (ref 0.44–1.00)
GFR calc Af Amer: 27 mL/min — ABNORMAL LOW (ref >60)
GFR, EST NON AFRICAN AMERICAN: 23 mL/min — AB (ref >60)
Glucose, Bld: 107 mg/dL — ABNORMAL HIGH (ref 65–99)
POTASSIUM: 5.4 mmol/L — AB (ref 3.5–5.1)
SODIUM: 138 mmol/L (ref 135–145)
Total Bilirubin: 5.8 mg/dL — ABNORMAL HIGH (ref 0.3–1.2)
Total Protein: 6.4 g/dL — ABNORMAL LOW (ref 6.5–8.1)

## 2016-06-03 LAB — CBC WITH DIFFERENTIAL/PLATELET
Basophils Absolute: 0 K/uL (ref 0–0.1)
Basophils Relative: 0 %
Eosinophils Absolute: 0.3 K/uL (ref 0–0.7)
Eosinophils Relative: 2 %
HCT: 22.9 % — ABNORMAL LOW (ref 35.0–47.0)
Hemoglobin: 7.2 g/dL — ABNORMAL LOW (ref 12.0–16.0)
Lymphocytes Relative: 8 %
Lymphs Abs: 1 K/uL (ref 1.0–3.6)
MCH: 30.9 pg (ref 26.0–34.0)
MCHC: 31.6 g/dL — ABNORMAL LOW (ref 32.0–36.0)
MCV: 97.8 fL (ref 80.0–100.0)
Monocytes Absolute: 1.2 K/uL — ABNORMAL HIGH (ref 0.2–0.9)
Monocytes Relative: 9 %
Neutro Abs: 10.5 K/uL — ABNORMAL HIGH (ref 1.4–6.5)
Neutrophils Relative %: 81 %
Platelets: 265 K/uL (ref 150–440)
RBC: 2.34 MIL/uL — ABNORMAL LOW (ref 3.80–5.20)
RDW: 26.2 % — ABNORMAL HIGH (ref 11.5–14.5)
WBC: 13 K/uL — ABNORMAL HIGH (ref 3.6–11.0)
nRBC: 7 /100{WBCs} — ABNORMAL HIGH

## 2016-06-03 LAB — SAMPLE TO BLOOD BANK

## 2016-06-03 MED ORDER — EPOETIN ALFA 40000 UNIT/ML IJ SOLN
40000.0000 [IU] | Freq: Once | INTRAMUSCULAR | Status: AC
  Administered 2016-06-03: 40000 [IU] via SUBCUTANEOUS
  Filled 2016-06-03: qty 1

## 2016-06-05 ENCOUNTER — Inpatient Hospital Stay: Payer: Medicare Other

## 2016-06-05 ENCOUNTER — Other Ambulatory Visit: Payer: Self-pay | Admitting: *Deleted

## 2016-06-07 ENCOUNTER — Encounter: Payer: Self-pay | Admitting: Emergency Medicine

## 2016-06-07 ENCOUNTER — Inpatient Hospital Stay
Admission: EM | Admit: 2016-06-07 | Discharge: 2016-06-09 | DRG: 603 | Disposition: A | Discharge status: Home Health | Payer: Medicare Other | Attending: Internal Medicine | Admitting: Internal Medicine

## 2016-06-07 ENCOUNTER — Emergency Department: Payer: Medicare Other

## 2016-06-07 DIAGNOSIS — M79641 Pain in right hand: Secondary | ICD-10-CM | POA: Diagnosis not present

## 2016-06-07 DIAGNOSIS — E872 Acidosis: Secondary | ICD-10-CM | POA: Diagnosis present

## 2016-06-07 DIAGNOSIS — D571 Sickle-cell disease without crisis: Secondary | ICD-10-CM | POA: Diagnosis present

## 2016-06-07 DIAGNOSIS — I509 Heart failure, unspecified: Secondary | ICD-10-CM | POA: Diagnosis present

## 2016-06-07 DIAGNOSIS — M109 Gout, unspecified: Secondary | ICD-10-CM | POA: Diagnosis present

## 2016-06-07 DIAGNOSIS — M7989 Other specified soft tissue disorders: Secondary | ICD-10-CM | POA: Diagnosis present

## 2016-06-07 DIAGNOSIS — Z888 Allergy status to other drugs, medicaments and biological substances status: Secondary | ICD-10-CM

## 2016-06-07 DIAGNOSIS — Z9049 Acquired absence of other specified parts of digestive tract: Secondary | ICD-10-CM

## 2016-06-07 DIAGNOSIS — J449 Chronic obstructive pulmonary disease, unspecified: Secondary | ICD-10-CM | POA: Diagnosis present

## 2016-06-07 DIAGNOSIS — L03113 Cellulitis of right upper limb: Principal | ICD-10-CM | POA: Diagnosis present

## 2016-06-07 DIAGNOSIS — N2581 Secondary hyperparathyroidism of renal origin: Secondary | ICD-10-CM | POA: Diagnosis present

## 2016-06-07 DIAGNOSIS — M6281 Muscle weakness (generalized): Secondary | ICD-10-CM

## 2016-06-07 DIAGNOSIS — L03119 Cellulitis of unspecified part of limb: Secondary | ICD-10-CM

## 2016-06-07 DIAGNOSIS — Z79891 Long term (current) use of opiate analgesic: Secondary | ICD-10-CM

## 2016-06-07 DIAGNOSIS — N185 Chronic kidney disease, stage 5: Secondary | ICD-10-CM | POA: Diagnosis present

## 2016-06-07 DIAGNOSIS — E875 Hyperkalemia: Secondary | ICD-10-CM | POA: Diagnosis present

## 2016-06-07 DIAGNOSIS — L02519 Cutaneous abscess of unspecified hand: Secondary | ICD-10-CM | POA: Diagnosis present

## 2016-06-07 DIAGNOSIS — L02511 Cutaneous abscess of right hand: Secondary | ICD-10-CM | POA: Diagnosis present

## 2016-06-07 DIAGNOSIS — R609 Edema, unspecified: Secondary | ICD-10-CM

## 2016-06-07 DIAGNOSIS — L0390 Cellulitis, unspecified: Secondary | ICD-10-CM | POA: Diagnosis present

## 2016-06-07 DIAGNOSIS — Z7951 Long term (current) use of inhaled steroids: Secondary | ICD-10-CM

## 2016-06-07 DIAGNOSIS — Z8249 Family history of ischemic heart disease and other diseases of the circulatory system: Secondary | ICD-10-CM

## 2016-06-07 DIAGNOSIS — E559 Vitamin D deficiency, unspecified: Secondary | ICD-10-CM | POA: Diagnosis present

## 2016-06-07 DIAGNOSIS — R262 Difficulty in walking, not elsewhere classified: Secondary | ICD-10-CM

## 2016-06-07 DIAGNOSIS — N179 Acute kidney failure, unspecified: Secondary | ICD-10-CM | POA: Diagnosis present

## 2016-06-07 DIAGNOSIS — M810 Age-related osteoporosis without current pathological fracture: Secondary | ICD-10-CM | POA: Diagnosis present

## 2016-06-07 DIAGNOSIS — T39395A Adverse effect of other nonsteroidal anti-inflammatory drugs [NSAID], initial encounter: Secondary | ICD-10-CM | POA: Diagnosis present

## 2016-06-07 DIAGNOSIS — D631 Anemia in chronic kidney disease: Secondary | ICD-10-CM | POA: Diagnosis present

## 2016-06-07 DIAGNOSIS — Z87891 Personal history of nicotine dependence: Secondary | ICD-10-CM

## 2016-06-07 DIAGNOSIS — Z79899 Other long term (current) drug therapy: Secondary | ICD-10-CM

## 2016-06-07 DIAGNOSIS — I132 Hypertensive heart and chronic kidney disease with heart failure and with stage 5 chronic kidney disease, or end stage renal disease: Secondary | ICD-10-CM | POA: Diagnosis present

## 2016-06-07 HISTORY — DX: Chronic kidney disease, stage 5: N18.5

## 2016-06-07 LAB — CBC
HCT: 20.9 % — ABNORMAL LOW (ref 35.0–47.0)
HEMOGLOBIN: 6.9 g/dL — AB (ref 12.0–16.0)
MCH: 30.7 pg (ref 26.0–34.0)
MCHC: 32.9 g/dL (ref 32.0–36.0)
MCV: 93.6 fL (ref 80.0–100.0)
Platelets: 213 10*3/uL (ref 150–440)
RBC: 2.23 MIL/uL — AB (ref 3.80–5.20)
RDW: 23.7 % — ABNORMAL HIGH (ref 11.5–14.5)
WBC: 14 10*3/uL — AB (ref 3.6–11.0)

## 2016-06-07 LAB — COMPREHENSIVE METABOLIC PANEL
ALK PHOS: 171 U/L — AB (ref 38–126)
ALT: 32 U/L (ref 14–54)
AST: 40 U/L (ref 15–41)
Albumin: 3.4 g/dL — ABNORMAL LOW (ref 3.5–5.0)
Anion gap: 8 (ref 5–15)
BUN: 43 mg/dL — AB (ref 6–20)
CALCIUM: 8.4 mg/dL — AB (ref 8.9–10.3)
CO2: 24 mmol/L (ref 22–32)
CREATININE: 2.89 mg/dL — AB (ref 0.44–1.00)
Chloride: 102 mmol/L (ref 101–111)
GFR, EST AFRICAN AMERICAN: 19 mL/min — AB (ref >60)
GFR, EST NON AFRICAN AMERICAN: 16 mL/min — AB (ref >60)
Glucose, Bld: 105 mg/dL — ABNORMAL HIGH (ref 65–99)
Potassium: 4.2 mmol/L (ref 3.5–5.1)
Sodium: 134 mmol/L — ABNORMAL LOW (ref 135–145)
Total Bilirubin: 5.7 mg/dL — ABNORMAL HIGH (ref 0.3–1.2)
Total Protein: 7.1 g/dL (ref 6.5–8.1)

## 2016-06-07 MED ORDER — HYDROMORPHONE HCL 1 MG/ML IJ SOLN
1.0000 mg | Freq: Once | INTRAMUSCULAR | Status: AC
  Administered 2016-06-07: 1 mg via INTRAVENOUS
  Filled 2016-06-07: qty 1

## 2016-06-07 MED ORDER — FENTANYL CITRATE (PF) 100 MCG/2ML IJ SOLN
50.0000 ug | Freq: Once | INTRAMUSCULAR | Status: AC
  Administered 2016-06-07: 50 ug via INTRAVENOUS
  Filled 2016-06-07: qty 2

## 2016-06-07 NOTE — ED Notes (Signed)
MD notified of continued pain, medication order given.

## 2016-06-07 NOTE — ED Triage Notes (Signed)
Pt states that she was recently hospitalized, pt states that due to her fistula in her left arm she had multiple blood draws and iv's in her rt arm and hand, pt states last Wednesday her rt hand started swelling and has cont to get worse. Pt's hand is very swollen with a plastic appearance, pt is unable to move the fingers, + cap refill within normal limits and + radial pulse

## 2016-06-07 NOTE — ED Provider Notes (Signed)
[facility] Emergency Department Provider Note  Time seen: 9:34 PM  I have reviewed the triage vital signs and the nursing notes.   HISTORY  Chief Complaint Hand Pain    HPI Jane Short is a 66 y.o. female with a past medical history of anemia, CHF, stage IV kidney disease, hypertension, sickle cell disease, presents to the emergency department for right arm swelling. According to the patient she was admitted to the hospital from December 10 until December 21. She states multiple blood draws from the right arm. For the past 4 or 5 days she hasprogressive swelling and pain in her right upper extremity. Patient's right hand is significantly swollen. Patient denies fever, nausea, vomiting. Denies any blood thinner use.  Past Medical History:  Diagnosis Date  . Anemia   . 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 Cheree Ditto at [facility]  . CHF (congestive heart failure) ([place])   . Congestive heart failure (CHF) ([place])   . Congestive heart failure (CHF) ([place]) 06/2015  . COPD (chronic obstructive pulmonary disease) (HCC)    symptoms Dr. Patricia Pesa   . Gout   . Hypertension   . Lichen planus   . Osteoporosis 2011   osteopenia  . 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 Sickle cell anemia (HCC)    sickle cell disease  . Sickle cell anemia (HCC)   . Thrombocytopenia ([place])   . Vitamin D deficiency     Patient Active Problem List   Diagnosis Date Noted  . UTI (urinary tract infection) 05/29/2016  . Gout 05/29/2016  . Spinal stenosis, lumbar region, without neurogenic claudication 05/20/2016  . Valvular heart disease 05/13/2016  . Renal dialysis device, implant, or graft complication 0000000  . Iron deficiency anemia 12/06/2015  . Anemia of chronic renal failure, stage 4 (severe) ([place]) 12/06/2015  . Chronic progressive renal failure 08/28/2015  . Atrial fibrillation ([place]) 07/30/2015  . Insomnia 07/30/2015  . H. pylori infection 07/30/2015  .  Bilirubinemia 06/27/2015  . CHF (congestive heart failure) ([place]) 06/05/2015  . Elevated bilirubin   . Hernia, ventral 04/17/2015  . Lichen planus [id number]  . CKD (chronic kidney disease), stage IV ([place]) 01/17/2015  . Atherosclerosis of aorta ([place]) 01/10/2015  . Abnormal CXR 01/03/2015  . Bilateral low back pain without sciatica 01/03/2015  . Hb-SS disease without crisis ([place]) 10/17/2014  . Screening for colon cancer 08/04/2012  . Screening for breast cancer 08/04/2012  . Edema 11/19/2011  . Atrial myxoma 07/23/2011    Past Surgical History:  Procedure Laterality Date  . AV FISTULA PLACEMENT Left 02/27/2016   Procedure: INSERTION OF ARTERIOVENOUS (AV) GORE-TEX GRAFT ARM ( FOREARM LOOP );  Surgeon: Algernon Huxley, MD;  Location: [facility] ORS;  Service: Vascular;  Laterality: Left;  . CHOLECYSTECTOMY    . COLONOSCOPY  2014   [facility]  . EYE SURGERY     cataract bilateral  . GALLBLADDER SURGERY    . PERIPHERAL VASCULAR CATHETERIZATION Left 06/14/2015   Procedure: Dialysis/Perma Catheter Insertion;  Surgeon: Katha Cabal, MD;  Location: [place] CV LAB;  Service: Cardiovascular;  Laterality: Left;  . PERIPHERAL VASCULAR CATHETERIZATION N/A 01/23/2016   Procedure: Glori Luis Cath Insertion;  Surgeon: Algernon Huxley, MD;  Location: [place] CV LAB;  Service: Cardiovascular;  Laterality: N/A;  . PERIPHERAL VASCULAR CATHETERIZATION Left 03/30/2016   Procedure: A/V Shuntogram/Fistulagram;  Surgeon: Algernon Huxley, MD;  Location: [place] CV LAB;  Service: Cardiovascular;  Laterality: Left;  .  TUBAL LIGATION      Prior to Admission medications   Medication Sig Start Date End Date Taking? Authorizing Provider  allopurinol (ZYLOPRIM) 100 MG tablet Take 1 tablet (100 mg total) by mouth daily. 05/21/16   Leana Gamer, MD  amiodarone (PACERONE) 100 MG tablet Take 100 mg by mouth daily.    Historical Provider, MD  calcitRIOL (ROCALTROL) 0.25 MCG capsule Take 0.25 mcg by mouth daily.     Historical Provider, MD  epoetin alfa (EPOGEN,PROCRIT) 16109 UNIT/ML injection Inject into the skin 3 (three) times a week.    Historical Provider, MD  fluticasone furoate-vilanterol (BREO ELLIPTA) 200-25 MCG/INH AEPB Inhale 1 puff into the lungs daily. 02/27/16   Flora Lipps, MD  folic acid (FOLVITE) 1 MG tablet Take 1 mg by mouth daily.    Historical Provider, MD  morphine (MSIR) 15 MG tablet Take 1 tablet (15 mg total) by mouth every 12 (twelve) hours as needed for severe pain. 05/21/16   Leana Gamer, MD  Nutritional Supplements (FEEDING SUPPLEMENT, NEPRO CARB STEADY,) LIQD Take 237 mLs by mouth 2 (two) times daily between meals. 05/21/16   Leana Gamer, MD  OXYGEN Inhale into the lungs.    Historical Provider, MD  pantoprazole (PROTONIX) 40 MG tablet Take 1 tablet (40 mg total) by mouth daily. Take until Prednisone completed 05/21/16 06/02/16  Leana Gamer, MD  predniSONE (STERAPRED UNI-PAK 21 TAB) 10 MG (21) TBPK tablet 60 mg po daily x 1, then 50 mg po daily x 2, then 40 mg po daily x 2, then 30 mg po daily x 2, then 20 mg po daily x 2, then 10 mg po daily x 2, then stop. 05/21/16   Leana Gamer, MD  sodium bicarbonate 650 MG tablet Take 650 mg by mouth 2 (two) times daily.     Historical Provider, MD    Allergies  Allergen Reactions  . Ramipril     cough  . Septra [Sulfamethoxazole-Trimethoprim]     Joint pain    Family History  Problem Relation Age of Onset  . Heart disease Father   . Diabetes Father   . Diabetes Sister   . Cancer Mother     breast cancer  . Cancer Maternal Aunt     Breast cancer    Social History Social History  Substance Use Topics  . Smoking status: Former Smoker    Packs/day: 0.25    Years: 30.00    Types: Cigarettes    Quit date: 04/29/2015  . Smokeless tobacco: Never Used     Comment: quit 1 month ago  . Alcohol use No    Review of Systems Constitutional: Negative for fever Cardiovascular: Negative for chest  pain. Respiratory: Negative for shortness of breath. Gastrointestinal: Negative for abdominal pain, vomiting Genitourinary: Negative for dysuria. Musculoskeletal: Significant right upper extremity pain and swelling. Skin: Mild erythema of right hand. Neurological: Negative for headache 10-point ROS otherwise negative.  ____________________________________________   PHYSICAL EXAM:  VITAL SIGNS: ED Triage Vitals  Enc Vitals Group     BP 06/07/16 2027 (!) 143/64     Pulse Rate 06/07/16 2027 99     Resp 06/07/16 2027 (!) 22     Temp 06/07/16 2027 98.5 F (36.9 C)     Temp Source 06/07/16 2027 Oral     SpO2 06/07/16 2027 99 %     Weight 06/07/16 2028 184 lb (83.5 kg)     Height 06/07/16 2028 5\' 5"  (1.651 m)  Head Circumference --      Peak Flow --      Pain Score 06/07/16 2028 9     Pain Loc --      Pain Edu? --      Excl. in [place]? --     Constitutional: Alert and oriented. Well appearing and in no distress. Eyes: Normal exam ENT   Head: Normocephalic and atraumatic.   Mouth/Throat: Mucous membranes are moist. Cardiovascular: Normal rate, regular rhythm. Respiratory: Normal respiratory effort without tachypnea nor retractions. Breath sounds are clear  Gastrointestinal: Soft and nontender. No distention. Musculoskeletal: Significant edema from the mid forearm through the right hand. Appears to be nerve actually intact with normal sensation per patient, good cap refill and 2+ radial pulse. Area is tender to palpation. Neurologic:  Normal speech and language. No gross focal neurologic deficits  Skin:  Skin is warm, dry and intact.  Psychiatric: Mood and affect are normal.   ____________________________________________     RADIOLOGY  X-ray negative Ultrasound pending ____________________________________________   INITIAL IMPRESSION / ASSESSMENT AND PLAN / ED COURSE  Pertinent labs & imaging results that were available during my care of the patient were  reviewed by me and considered in my medical decision making (see chart for details).  Patient presents to the emergency department for right arm pain and swelling for the past 4 days. Hand and distal forearm is considerably swollen with tenderness and erythema. Highly suspect DVT versus cellulitis. Denies fever. We will check labs, ultrasound, treat pain and closely monitor in the emergency department.  Ultrasound pending. White blood cell count elevated to 14,000. Discussed the patient with Dr. Alfred Levins will be taking over care.  ____________________________________________   FINAL CLINICAL IMPRESSION(S) / ED DIAGNOSES  Right upper extremity pain and swelling    Harvest Dark, MD 06/07/16 2352

## 2016-06-07 NOTE — ED Notes (Signed)
Patient reports swelling to right hand since previous hospitalization last week.  Patient reports having multiple lab draws from her right arm.  Right hand with large amount of swelling and redness noted.  Patient reports pain to hand.  +cap refill and + radial pulse.  Right hand elevated for comfort.

## 2016-06-08 ENCOUNTER — Encounter: Payer: Self-pay | Admitting: Internal Medicine

## 2016-06-08 ENCOUNTER — Inpatient Hospital Stay: Payer: Medicare Other

## 2016-06-08 ENCOUNTER — Emergency Department: Payer: Medicare Other

## 2016-06-08 DIAGNOSIS — E559 Vitamin D deficiency, unspecified: Secondary | ICD-10-CM | POA: Diagnosis present

## 2016-06-08 DIAGNOSIS — D631 Anemia in chronic kidney disease: Secondary | ICD-10-CM | POA: Diagnosis present

## 2016-06-08 DIAGNOSIS — I10 Essential (primary) hypertension: Secondary | ICD-10-CM

## 2016-06-08 DIAGNOSIS — E875 Hyperkalemia: Secondary | ICD-10-CM | POA: Diagnosis present

## 2016-06-08 DIAGNOSIS — Z79899 Other long term (current) drug therapy: Secondary | ICD-10-CM | POA: Diagnosis not present

## 2016-06-08 DIAGNOSIS — J449 Chronic obstructive pulmonary disease, unspecified: Secondary | ICD-10-CM | POA: Diagnosis present

## 2016-06-08 DIAGNOSIS — R600 Localized edema: Secondary | ICD-10-CM

## 2016-06-08 DIAGNOSIS — T39395A Adverse effect of other nonsteroidal anti-inflammatory drugs [NSAID], initial encounter: Secondary | ICD-10-CM | POA: Diagnosis present

## 2016-06-08 DIAGNOSIS — D649 Anemia, unspecified: Secondary | ICD-10-CM

## 2016-06-08 DIAGNOSIS — N185 Chronic kidney disease, stage 5: Secondary | ICD-10-CM | POA: Diagnosis present

## 2016-06-08 DIAGNOSIS — N179 Acute kidney failure, unspecified: Secondary | ICD-10-CM | POA: Diagnosis present

## 2016-06-08 DIAGNOSIS — M109 Gout, unspecified: Secondary | ICD-10-CM | POA: Diagnosis present

## 2016-06-08 DIAGNOSIS — L02511 Cutaneous abscess of right hand: Secondary | ICD-10-CM | POA: Diagnosis present

## 2016-06-08 DIAGNOSIS — M79641 Pain in right hand: Secondary | ICD-10-CM | POA: Diagnosis present

## 2016-06-08 DIAGNOSIS — L03113 Cellulitis of right upper limb: Secondary | ICD-10-CM | POA: Diagnosis present

## 2016-06-08 DIAGNOSIS — Z79891 Long term (current) use of opiate analgesic: Secondary | ICD-10-CM | POA: Diagnosis not present

## 2016-06-08 DIAGNOSIS — E872 Acidosis: Secondary | ICD-10-CM | POA: Diagnosis present

## 2016-06-08 DIAGNOSIS — Z888 Allergy status to other drugs, medicaments and biological substances status: Secondary | ICD-10-CM | POA: Diagnosis not present

## 2016-06-08 DIAGNOSIS — I132 Hypertensive heart and chronic kidney disease with heart failure and with stage 5 chronic kidney disease, or end stage renal disease: Secondary | ICD-10-CM | POA: Diagnosis present

## 2016-06-08 DIAGNOSIS — L03119 Cellulitis of unspecified part of limb: Secondary | ICD-10-CM

## 2016-06-08 DIAGNOSIS — N2581 Secondary hyperparathyroidism of renal origin: Secondary | ICD-10-CM | POA: Diagnosis present

## 2016-06-08 DIAGNOSIS — Z7951 Long term (current) use of inhaled steroids: Secondary | ICD-10-CM | POA: Diagnosis not present

## 2016-06-08 DIAGNOSIS — M7989 Other specified soft tissue disorders: Secondary | ICD-10-CM | POA: Diagnosis present

## 2016-06-08 DIAGNOSIS — L0390 Cellulitis, unspecified: Secondary | ICD-10-CM | POA: Diagnosis present

## 2016-06-08 DIAGNOSIS — L02519 Cutaneous abscess of unspecified hand: Secondary | ICD-10-CM | POA: Diagnosis present

## 2016-06-08 DIAGNOSIS — Z9049 Acquired absence of other specified parts of digestive tract: Secondary | ICD-10-CM | POA: Diagnosis not present

## 2016-06-08 DIAGNOSIS — Z8249 Family history of ischemic heart disease and other diseases of the circulatory system: Secondary | ICD-10-CM | POA: Diagnosis not present

## 2016-06-08 DIAGNOSIS — Z87891 Personal history of nicotine dependence: Secondary | ICD-10-CM | POA: Diagnosis not present

## 2016-06-08 DIAGNOSIS — D571 Sickle-cell disease without crisis: Secondary | ICD-10-CM | POA: Diagnosis present

## 2016-06-08 DIAGNOSIS — M810 Age-related osteoporosis without current pathological fracture: Secondary | ICD-10-CM | POA: Diagnosis present

## 2016-06-08 DIAGNOSIS — N189 Chronic kidney disease, unspecified: Secondary | ICD-10-CM

## 2016-06-08 DIAGNOSIS — I509 Heart failure, unspecified: Secondary | ICD-10-CM | POA: Diagnosis present

## 2016-06-08 LAB — BASIC METABOLIC PANEL
ANION GAP: 8 (ref 5–15)
BUN: 41 mg/dL — ABNORMAL HIGH (ref 6–20)
CHLORIDE: 104 mmol/L (ref 101–111)
CO2: 22 mmol/L (ref 22–32)
Calcium: 8.3 mg/dL — ABNORMAL LOW (ref 8.9–10.3)
Creatinine, Ser: 2.72 mg/dL — ABNORMAL HIGH (ref 0.44–1.00)
GFR calc non Af Amer: 17 mL/min — ABNORMAL LOW (ref >60)
GFR, EST AFRICAN AMERICAN: 20 mL/min — AB (ref >60)
Glucose, Bld: 102 mg/dL — ABNORMAL HIGH (ref 65–99)
POTASSIUM: 4 mmol/L (ref 3.5–5.1)
Sodium: 134 mmol/L — ABNORMAL LOW (ref 135–145)

## 2016-06-08 LAB — CBC
HEMATOCRIT: 18.9 % — AB (ref 35.0–47.0)
Hemoglobin: 6.2 g/dL — ABNORMAL LOW (ref 12.0–16.0)
MCH: 30.2 pg (ref 26.0–34.0)
MCHC: 32.7 g/dL (ref 32.0–36.0)
MCV: 92.2 fL (ref 80.0–100.0)
PLATELETS: 215 10*3/uL (ref 150–440)
RBC: 2.05 MIL/uL — AB (ref 3.80–5.20)
RDW: 24.4 % — ABNORMAL HIGH (ref 11.5–14.5)
WBC: 13.1 10*3/uL — AB (ref 3.6–11.0)

## 2016-06-08 LAB — URIC ACID: Uric Acid, Serum: 9.4 mg/dL — ABNORMAL HIGH (ref 2.3–6.6)

## 2016-06-08 LAB — HEMOGLOBIN AND HEMATOCRIT, BLOOD
HCT: 21.4 % — ABNORMAL LOW (ref 35.0–47.0)
Hemoglobin: 7 g/dL — ABNORMAL LOW (ref 12.0–16.0)

## 2016-06-08 LAB — PREPARE RBC (CROSSMATCH)

## 2016-06-08 MED ORDER — VANCOMYCIN HCL 10 G IV SOLR
1500.0000 mg | Freq: Once | INTRAVENOUS | Status: AC
  Administered 2016-06-08: 1500 mg via INTRAVENOUS
  Filled 2016-06-08: qty 1500

## 2016-06-08 MED ORDER — HEPARIN SODIUM (PORCINE) 5000 UNIT/ML IJ SOLN
5000.0000 [IU] | Freq: Three times a day (TID) | INTRAMUSCULAR | Status: DC
  Administered 2016-06-08 – 2016-06-09 (×4): 5000 [IU] via SUBCUTANEOUS
  Filled 2016-06-08 (×4): qty 1

## 2016-06-08 MED ORDER — SODIUM CHLORIDE 0.9 % IV SOLN: 250.0000 mL | INTRAVENOUS | Status: DC | PRN

## 2016-06-08 MED ORDER — ONDANSETRON HCL 4 MG PO TABS: 4.0000 mg | ORAL_TABLET | Freq: Four times a day (QID) | ORAL | Status: DC | PRN

## 2016-06-08 MED ORDER — SODIUM BICARBONATE 650 MG PO TABS
650.0000 mg | ORAL_TABLET | Freq: Two times a day (BID) | ORAL | Status: DC
Start: 2016-06-08 — End: 2016-06-09
  Administered 2016-06-08 – 2016-06-09 (×3): 650 mg via ORAL
  Filled 2016-06-08 (×3): qty 1

## 2016-06-08 MED ORDER — SODIUM CHLORIDE 0.9 % IV SOLN
Freq: Once | INTRAVENOUS | Status: AC
  Administered 2016-06-08: 16:00:00 via INTRAVENOUS

## 2016-06-08 MED ORDER — ALLOPURINOL 100 MG PO TABS
100.0000 mg | ORAL_TABLET | Freq: Every day | ORAL | Status: DC
  Administered 2016-06-08 – 2016-06-09 (×2): 100 mg via ORAL
  Filled 2016-06-08 (×2): qty 1

## 2016-06-08 MED ORDER — HYDROCODONE-ACETAMINOPHEN 5-325 MG PO TABS
1.0000 | ORAL_TABLET | ORAL | Status: DC | PRN
  Administered 2016-06-08 – 2016-06-09 (×4): 1 via ORAL
  Filled 2016-06-08: qty 1
  Filled 2016-06-08: qty 2
  Filled 2016-06-08 (×2): qty 1

## 2016-06-08 MED ORDER — ACETAMINOPHEN 325 MG PO TABS: 650.0000 mg | ORAL_TABLET | Freq: Four times a day (QID) | ORAL | Status: DC | PRN

## 2016-06-08 MED ORDER — ONDANSETRON HCL 4 MG/2ML IJ SOLN: 4.0000 mg | Freq: Four times a day (QID) | INTRAMUSCULAR | Status: DC | PRN

## 2016-06-08 MED ORDER — SODIUM CHLORIDE 0.9% FLUSH
3.0000 mL | Freq: Two times a day (BID) | INTRAVENOUS | Status: DC
  Administered 2016-06-08 – 2016-06-09 (×3): 3 mL via INTRAVENOUS

## 2016-06-08 MED ORDER — AMIODARONE HCL 200 MG PO TABS
100.0000 mg | ORAL_TABLET | Freq: Every day | ORAL | Status: DC
  Administered 2016-06-08 – 2016-06-09 (×2): 100 mg via ORAL
  Filled 2016-06-08 (×2): qty 1

## 2016-06-08 MED ORDER — PIPERACILLIN-TAZOBACTAM 4.5 G IVPB
4.5000 g | Freq: Three times a day (TID) | INTRAVENOUS | Status: DC
  Administered 2016-06-08: 4.5 g via INTRAVENOUS
  Filled 2016-06-08 (×3): qty 100

## 2016-06-08 MED ORDER — SENNOSIDES-DOCUSATE SODIUM 8.6-50 MG PO TABS: 1.0000 | ORAL_TABLET | Freq: Every evening | ORAL | Status: DC | PRN

## 2016-06-08 MED ORDER — HYDRALAZINE HCL 25 MG PO TABS
25.0000 mg | ORAL_TABLET | Freq: Three times a day (TID) | ORAL | Status: DC
  Administered 2016-06-09: 25 mg via ORAL
  Filled 2016-06-08 (×2): qty 1

## 2016-06-08 MED ORDER — FOLIC ACID 1 MG PO TABS
1.0000 mg | ORAL_TABLET | Freq: Every day | ORAL | Status: DC
  Administered 2016-06-08 – 2016-06-09 (×2): 1 mg via ORAL
  Filled 2016-06-08 (×2): qty 1

## 2016-06-08 MED ORDER — SODIUM CHLORIDE 0.9% FLUSH: 3.0000 mL | INTRAVENOUS | Status: DC | PRN

## 2016-06-08 MED ORDER — CALCITRIOL 0.25 MCG PO CAPS
0.2500 ug | ORAL_CAPSULE | Freq: Every day | ORAL | Status: DC
  Administered 2016-06-08 – 2016-06-09 (×2): 0.25 ug via ORAL
  Filled 2016-06-08 (×2): qty 1

## 2016-06-08 MED ORDER — PIPERACILLIN-TAZOBACTAM 3.375 G IVPB 30 MIN: 3.3750 g | Freq: Once | INTRAVENOUS | Status: DC

## 2016-06-08 MED ORDER — FUROSEMIDE 10 MG/ML IJ SOLN
40.0000 mg | Freq: Once | INTRAMUSCULAR | Status: AC
  Administered 2016-06-08: 40 mg via INTRAVENOUS
  Filled 2016-06-08: qty 4

## 2016-06-08 MED ORDER — VANCOMYCIN HCL IN DEXTROSE 1-5 GM/200ML-% IV SOLN: 1000.0000 mg | Freq: Once | INTRAVENOUS | Status: DC

## 2016-06-08 MED ORDER — ACETAMINOPHEN 650 MG RE SUPP: 650.0000 mg | Freq: Four times a day (QID) | RECTAL | Status: DC | PRN

## 2016-06-08 MED ORDER — HYDRALAZINE HCL 20 MG/ML IJ SOLN: 10.0000 mg | Freq: Four times a day (QID) | INTRAMUSCULAR | Status: DC | PRN

## 2016-06-08 MED ORDER — FLUTICASONE FUROATE-VILANTEROL 200-25 MCG/INH IN AEPB
1.0000 | INHALATION_SPRAY | Freq: Every day | RESPIRATORY_TRACT | Status: DC
  Administered 2016-06-08 – 2016-06-09 (×2): 1 via RESPIRATORY_TRACT
  Filled 2016-06-08: qty 28

## 2016-06-08 MED ORDER — CEFAZOLIN IN D5W 1 GM/50ML IV SOLN
1.0000 g | Freq: Two times a day (BID) | INTRAVENOUS | Status: DC
  Administered 2016-06-08 – 2016-06-09 (×2): 1 g via INTRAVENOUS
  Filled 2016-06-08 (×4): qty 50

## 2016-06-08 MED ORDER — PIPERACILLIN-TAZOBACTAM 3.375 G IVPB
3.3750 g | Freq: Three times a day (TID) | INTRAVENOUS | Status: DC
  Administered 2016-06-08: 3.375 g via INTRAVENOUS
  Filled 2016-06-08: qty 50

## 2016-06-08 MED ORDER — VANCOMYCIN HCL IN DEXTROSE 1-5 GM/200ML-% IV SOLN
1000.0000 mg | INTRAVENOUS | Status: DC
  Filled 2016-06-08: qty 200

## 2016-06-08 MED ORDER — MORPHINE SULFATE 15 MG PO TABS: 15.0000 mg | ORAL_TABLET | Freq: Two times a day (BID) | ORAL | Status: DC | PRN

## 2016-06-08 MED ORDER — NEPRO/CARBSTEADY PO LIQD
237.0000 mL | Freq: Two times a day (BID) | ORAL | Status: DC
  Administered 2016-06-08: 237 mL via ORAL

## 2016-06-08 NOTE — Progress Notes (Signed)
Pharmacy Antibiotic Note  Jane Short is a 66 y.o. female admitted on 06/07/2016 with cellulitis.  Pharmacy has been consulted for vancomycin and Zosyn dosing.  Plan: DW 68kg  Vd 48L kei 0.022 hr-1  T1/2 32 hours Vancomycin 1 gram q 36 hours ordered with stacked dosing. Level before 4th dose. Goal trough 15-20.  Zosyn 4.5 grams q 8 hours ordered for Pseudomonas risk of abx received in last 3 months.  Height: 5\' 5"  (165.1 cm) Weight: 184 lb (83.5 kg) IBW/kg (Calculated) : 57  Temp (24hrs), Avg:98.5 F (36.9 C), Min:98.5 F (36.9 C), Max:98.5 F (36.9 C)   Recent Labs Lab 06/03/16 1050 06/07/16 2214  WBC 13.0* 14.0*  CREATININE 2.15* 2.89*    Estimated Creatinine Clearance: 20.7 mL/min (by C-G formula based on SCr of 2.89 mg/dL (H)).    Allergies  Allergen Reactions  . Ramipril     cough  . Septra [Sulfamethoxazole-Trimethoprim]     Joint pain    Antimicrobials this admission: vancomycin 1/8 >>  Zosyn 1/8 >>   Dose adjustments this admission:   Microbiology results: 1/8 BCx: pending 05/14/16 MRSA PCR: (-)    Thank you for allowing pharmacy to be a part of this patient's care.  Kerrington Sova S 06/08/2016 4:52 AM

## 2016-06-08 NOTE — Progress Notes (Signed)
[facility]  ROUNDING NOTE   Subjective:  Patient well-known to [place] as we follow her for chronic kidney disease stage IV/5. She has an immature left upper extremity fistula. She reports that over the past several days she's had increasing right upper extremity swelling. This was accompanied with some erythema which has improved. She had a right upper extremity ultrasound which was negative for clot. She was recently at [facility] and received one dialysis treatment however her renal function has now improved. Hemoglobin is down to 6.2.   Objective:  Vital signs in last 24 hours:  Temp:  [97.8 F (36.6 C)-98.5 F (36.9 C)] 98.5 F (36.9 C) (01/08 0831) Pulse Rate:  [85-99] 93 (01/08 0831) Resp:  [12-22] 12 (01/08 0831) BP: (129-168)/(64-79) 160/79 (01/08 0831) SpO2:  [96 %-99 %] 96 % (01/08 0831) Weight:  [83.5 kg (184 lb)] 83.5 kg (184 lb) (01/07 2028)  Weight change:  Filed Weights   06/07/16 2028  Weight: 83.5 kg (184 lb)    Intake/Output: I/O last 3 completed shifts: In: 572.3 [IV Piggyback:572.3] Out: -    Intake/Output this shift:  Total I/O In: 297.5 [P.O.:240; IV Piggyback:57.5] Out: 400 [Urine:400]  Physical Exam: General: No acute distress  Head: Normocephalic, atraumatic. Moist oral mucosal membranes  Eyes: Anicteric  Neck: Supple, trachea midline  Lungs:  Clear to auscultation, normal effort  Heart: S1S2 no rubs  Abdomen:  Soft, nontender  Extremities: 3+ RUE swelling , trace b/l LE edema  Neurologic: Nonfocal, moving all four extremities  Skin: No lesions  Access: Clotted LUE AVF    Basic Metabolic Panel:  Recent Labs Lab 06/03/16 1050 06/07/16 2214 06/08/16 0535  NA 138 134* 134*  K 5.4* 4.2 4.0  CL 111 102 104  CO2 22 24 22   GLUCOSE 107* 105* 102*  BUN 26* 43* 41*  CREATININE 2.15* 2.89* 2.72*  CALCIUM 9.2 8.4* 8.3*    Liver Function Tests:  Recent Labs Lab 06/03/16 1050 06/07/16 2214  AST 54* 40  ALT  55* 32  ALKPHOS 175* 171*  BILITOT 5.8* 5.7*  PROT 6.4* 7.1  ALBUMIN 3.4* 3.4*   No results for input(s): LIPASE, AMYLASE in the last 168 hours. No results for input(s): AMMONIA in the last 168 hours.  CBC:  Recent Labs Lab 06/03/16 1050 06/07/16 2214 06/08/16 0535  WBC 13.0* 14.0* 13.1*  NEUTROABS 10.5*  --   --   HGB 7.2* 6.9* 6.2*  HCT 22.9* 20.9* 18.9*  MCV 97.8 93.6 92.2  PLT 265 213 215    Cardiac Enzymes: No results for input(s): CKTOTAL, CKMB, CKMBINDEX, TROPONINI in the last 168 hours.  BNP: Invalid input(s): POCBNP  CBG: No results for input(s): GLUCAP in the last 168 hours.  Microbiology: Results for orders placed or performed during the hospital encounter of 06/07/16  Blood culture (routine x 2)     Status: None (Preliminary result)   Collection Time: 06/08/16  2:57 AM  Result Value Ref Range Status   Specimen Description BLOOD PORTA CATH LEFT  Final   Special Requests BOTTLES DRAWN AEROBIC AND ANAEROBIC 7CCAERO,8CCANA  Final   Culture NO GROWTH < 12 HOURS  Final   Report Status PENDING  Incomplete  Blood culture (routine x 2)     Status: None (Preliminary result)   Collection Time: 06/08/16  2:57 AM  Result Value Ref Range Status   Specimen Description BLOOD RIGHT FOREARM  Final   Special Requests   Final    BOTTLES  DRAWN AEROBIC AND ANAEROBIC 11CCAERO,15CCANA   Culture NO GROWTH < 12 HOURS  Final   Report Status PENDING  Incomplete    Coagulation Studies: No results for input(s): LABPROT, INR in the last 72 hours.  Urinalysis: No results for input(s): COLORURINE, LABSPEC, PHURINE, GLUCOSEU, HGBUR, BILIRUBINUR, KETONESUR, PROTEINUR, UROBILINOGEN, NITRITE, LEUKOCYTESUR in the last 72 hours.  Invalid input(s): APPERANCEUR    Imaging: US Venous Img Upper Uni Right  Result Date: 06/08/2016 CLINICAL DATA:  Right hand swelling for 5 days. Recent hospitalization with IV is placed in the right arm. History of central line placement. Pain, edema,  tobacco use, color changes. EXAM: Right UPPER EXTREMITY VENOUS DOPPLER ULTRASOUND TECHNIQUE: Gray-scale sonography with graded compression, as well as color Doppler and duplex ultrasound were performed to evaluate the upper extremity deep venous system from the level of the subclavian vein and including the jugular, axillary, basilic, radial, ulnar and upper cephalic vein. Spectral Doppler was utilized to evaluate flow at rest and with distal augmentation maneuvers. COMPARISON:  None. FINDINGS: Contralateral Subclavian Vein: Unable the visualize contralateral left subclavian vein due to Port-A-Cath placement with bandages. Internal Jugular Vein: No evidence of thrombus. Normal compressibility, respiratory phasicity and response to augmentation. Subclavian Vein: No evidence of thrombus. Normal compressibility, respiratory phasicity and response to augmentation. Axillary Vein: No evidence of thrombus. Normal compressibility, respiratory phasicity and response to augmentation. Cephalic Vein: No evidence of thrombus. Normal compressibility, respiratory phasicity and response to augmentation. Basilic Vein: No evidence of thrombus. Normal compressibility, respiratory phasicity and response to augmentation. Brachial Veins: No evidence of thrombus. Normal compressibility, respiratory phasicity and response to augmentation. Radial Veins: No evidence of thrombus. Normal compressibility, respiratory phasicity and response to augmentation. Ulnar Veins: No evidence of thrombus. Normal compressibility, respiratory phasicity and response to augmentation. Venous Reflux:  None visualized. Other Findings:  None visualized. IMPRESSION: No evidence of deep venous thrombosis. Electronically Signed   By: Lucienne Capers M.D.   On: 06/08/2016 02:27   Dg Hand Complete Right  Result Date: 06/07/2016 CLINICAL DATA:  Acute onset of right hand swelling. Initial encounter. EXAM: RIGHT HAND - COMPLETE 3+ VIEW COMPARISON:  None. FINDINGS:  There is no evidence of fracture or dislocation. There is chronic widening of the scapholunate distance, which may be postoperative or degenerative in nature. There is diffuse osteopenia of visualized osseous structures. Mild negative ulnar variance is noted. Diffuse soft tissue swelling is noted about the hand and wrist. No radiopaque foreign bodies are seen. IMPRESSION: 1. No evidence of fracture or dislocation. 2. Diffuse soft tissue swelling about the hand and wrist. 3. Chronic widening of the scapholunate distance, which may be postoperative or degenerative in nature. 4. Diffuse osteopenia of visualized osseous structures. Electronically Signed   By: Garald Balding M.D.   On: 06/07/2016 20:57     Medications:    . sodium chloride   Intravenous Once  . allopurinol  100 mg Oral Daily  . amiodarone  100 mg Oral Daily  . calcitRIOL  0.25 mcg Oral Daily  . feeding supplement (NEPRO CARB STEADY)  237 mL Oral BID BM  . fluticasone furoate-vilanterol  1 puff Inhalation Daily  . folic acid  1 mg Oral Daily  . heparin  5,000 Units Subcutaneous Q8H  . piperacillin-tazobactam (ZOSYN)  IV  3.375 g Intravenous Q8H  . sodium bicarbonate  650 mg Oral BID  . sodium chloride flush  3 mL Intravenous Q12H  . [START ON 06/09/2016] vancomycin  1,000 mg Intravenous Q36H  sodium chloride, acetaminophen **OR** acetaminophen, HYDROcodone-acetaminophen, morphine, ondansetron **OR** ondansetron (ZOFRAN) IV, senna-docusate, sodium chloride flush  Assessment/ Plan:  66 y.o. female with sickle cell disease, atrial myxoma who presents a follow up   1. Acute Renal Failure requiring hemodialysis on Chronic Kidney Disease stage IV with hyperkalemia and metabolic acidosis.  Pt had recent ARF at [facility] and received one HD treatment.  Chronic Kidney Disease is secondary to NSAID use and sickle cell anemia.  - no urgent indication for dialysis at the moment.  Her left upper extremity AV fistula has clotted.  She will need an  AV graft closer to the time as to when she needs dialysis.  Continue supportive care and avoid NSAIDs and nephrotoxins as possible.  Also continue sodium bicarbonate.  2. Sickle Cell Disease with anemia of chronic kidney disease: Followed closely with [facility], Dr. Rogue Bussing - hemoglobin down to 6.2.  Discussed with hospitalist.  Patient to receive one unit PRBC transfusion.   3. Right upper extremity edema. - appears to be quite focal.  Ultrasound of the right upper extremity was negative for DVT.  She also had central line in place recently.  We will obtain vascular surgery opinion.  4. Gout: with recent acute gout flare  - continue low-dose allopurinol.  5. Secondary Hyperparathyroidism: continue calcitriol.   LOS: 0 Tieler Cournoyer 1/8/20183:13 PM

## 2016-06-08 NOTE — ED Notes (Signed)
Patient transport to 211

## 2016-06-08 NOTE — ED Provider Notes (Signed)
-----------------------------------------   2:48 AM on 06/08/2016 -----------------------------------------   Blood pressure (!) 143/64, pulse 99, temperature 98.5 F (36.9 C), temperature source Oral, resp. rate (!) 22, height 5\' 5"  (1.651 m), weight 184 lb (83.5 kg), SpO2 99 %.  Assuming care from Dr. Kerman Passey of [facility] is a 66 y.o. female with a chief complaint of Hand Pain .    In summary, 66 y.o. female with a past medical history of anemia, CHF, stage IV kidney disease, hypertension, sickle cell disease, presents to the emergency department for right arm swelling in the setting of multiple blood draws on a recent hospitalization  The current plan of care is to follow-up results of Doppler ultrasound and if those negative for blood clot patient should be admitted for IV antibiotics for hospital-acquired cellulitis.   US Venous Img Upper Uni Right (Final result)  Result time 06/08/16 02:27:50  Final result by Lucienne Capers, MD (06/08/16 02:27:50)           Narrative:   CLINICAL DATA: Right hand swelling for 5 days. Recent hospitalization with IV is placed in the right arm. History of central line placement. Pain, edema, tobacco use, color changes.  EXAM: Right UPPER EXTREMITY VENOUS DOPPLER ULTRASOUND  TECHNIQUE: Gray-scale sonography with graded compression, as well as color Doppler and duplex ultrasound were performed to evaluate the upper extremity deep venous system from the level of the subclavian vein and including the jugular, axillary, basilic, radial, ulnar and upper cephalic vein. Spectral Doppler was utilized to evaluate flow at rest and with distal augmentation maneuvers.  COMPARISON: None.  FINDINGS: Contralateral Subclavian Vein: Unable the visualize contralateral left subclavian vein due to Port-A-Cath placement with bandages.  Internal Jugular Vein: No evidence of thrombus. Normal compressibility, respiratory phasicity and response to  augmentation.  Subclavian Vein: No evidence of thrombus. Normal compressibility, respiratory phasicity and response to augmentation.  Axillary Vein: No evidence of thrombus. Normal compressibility, respiratory phasicity and response to augmentation.  Cephalic Vein: No evidence of thrombus. Normal compressibility, respiratory phasicity and response to augmentation.  Basilic Vein: No evidence of thrombus. Normal compressibility, respiratory phasicity and response to augmentation.  Brachial Veins: No evidence of thrombus. Normal compressibility, respiratory phasicity and response to augmentation.  Radial Veins: No evidence of thrombus. Normal compressibility, respiratory phasicity and response to augmentation.  Ulnar Veins: No evidence of thrombus. Normal compressibility, respiratory phasicity and response to augmentation.  Venous Reflux: None visualized.  Other Findings: None visualized.  IMPRESSION: No evidence of deep venous thrombosis.   Electronically Signed By: Lucienne Capers M.D. On: 06/08/2016 02:27           Doppler studies are negative for blood clot. Patient was started on IV vancomycin and Zosyn for hospital-acquired infection. Blood cultures have been sent. White count is 14K, vital signs are within normal limits with no evidence of sepsis at this time. Patient be a admitted to the hospitalist service.    Rudene Re, MD 06/08/16 [phone]

## 2016-06-08 NOTE — H&P (Signed)
[facility] [place] NAME: Jane Short    MR#:  [id number]  DATE OF BIRTH:  03/09/1951  DATE OF ADMISSION:  06/07/2016  PRIMARY CARE PHYSICIAN: Tommi Rumps, MD   REQUESTING/REFERRING PHYSICIAN:   CHIEF COMPLAINT:   Chief Complaint  Patient presents with  . Hand Pain    HISTORY OF PRESENT ILLNESS: Jane Short  is a 66 y.o. female with a known history of Anemia of chronic disease sickle cell disease, gout, congestive heart failure, hypertension, osteoporosis, spinal stenosis, vitamin D deficiency presented to the emergency room with pain in the right hand and right forearm since last 2 days. The pain is aching in nature 6 out of 10 on a scale of 1-10. No history of any fever or chills. No trauma to the right upper extremity. Patient was previously admitted to our hospital and then was transferred to [facility] and subsequently to [facility]. She was managed for chronic kidney disease stage V and she had one episode of dialysis with complicated graft. After dialysis was in her renal functions improved and she didn't need anymore dialysis. According to the patient not up blood work and phlebotomy was done from right upper extremity during her hospitalization. Her last 2 days she has since pain and swelling in the right hand and right forearm. She was worked up with a venous Doppler ultrasound of right approximately the emergency room today which showed no DVT. No complaints of chest pain, shortness of breath and palpitations. No history of headache dizziness and blurry vision. Hospitalist service was consulted for further care of the patient for cellulitis of the right hand and forearm. No complaints of any itching. Has tenderness in the right hand and forearm along with swelling.  PAST MEDICAL HISTORY:   Past Medical History:  Diagnosis Date  . Anemia   . 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 Cheree Ditto at [facility]  . CHF  (congestive heart failure) ([place])   . CKD (chronic kidney disease), stage V ([place])   . Congestive heart failure (CHF) ([place])   . Congestive heart failure (CHF) ([place]) 06/2015  . COPD (chronic obstructive pulmonary disease) ([facility])    symptoms Dr. Patricia Pesa   . Gout   . Hypertension   . Lichen planus   . Osteoporosis 2011   osteopenia  . SCA-1 (spinocerebellar ataxia type 1) ([place])   . Sickle cell anemia ([facility])    sickle cell disease  . Sickle cell anemia ([facility])   . Thrombocytopenia ([place])   . Vitamin D deficiency     PAST SURGICAL HISTORY: Past Surgical History:  Procedure Laterality Date  . AV FISTULA PLACEMENT Left 02/27/2016   Procedure: INSERTION OF ARTERIOVENOUS (AV) GORE-TEX GRAFT ARM ( FOREARM LOOP );  Surgeon: Algernon Huxley, MD;  Location: [facility] ORS;  Service: Vascular;  Laterality: Left;  . CHOLECYSTECTOMY    . COLONOSCOPY  2014   [facility]  . EYE SURGERY     cataract bilateral  . GALLBLADDER SURGERY    . PERIPHERAL VASCULAR CATHETERIZATION Left 06/14/2015   Procedure: Dialysis/Perma Catheter Insertion;  Surgeon: Katha Cabal, MD;  Location: [place] [facility];  Service: Cardiovascular;  Laterality: Left;  . PERIPHERAL VASCULAR CATHETERIZATION N/A 01/23/2016   Procedure: Glori Luis Cath Insertion;  Surgeon: Algernon Huxley, MD;  Location: [place] [facility];  Service: Cardiovascular;  Laterality: N/A;  . PERIPHERAL VASCULAR CATHETERIZATION Left 03/30/2016   Procedure: A/V Shuntogram/Fistulagram;  Surgeon: Algernon Huxley, MD;  Location: [place] [facility];  Service: Cardiovascular;  Laterality: Left;  . TUBAL LIGATION      SOCIAL HISTORY:  Social History  Substance Use Topics  . Smoking status: Former Smoker    Packs/day: 0.25    Years: 30.00    Types: Cigarettes    Quit date: 04/29/2015  . Smokeless tobacco: Never Used     Comment: quit 1 month ago  . Alcohol use No    FAMILY HISTORY:  Family History  Problem Relation Age of Onset  . Heart disease Father   . Diabetes Father    . Diabetes Sister   . Cancer Mother     breast cancer  . Cancer Maternal Aunt     Breast cancer    DRUG ALLERGIES:  Allergies  Allergen Reactions  . Ramipril     cough  . Septra [Sulfamethoxazole-Trimethoprim]     Joint pain    REVIEW OF SYSTEMS:   CONSTITUTIONAL: No fever, fatigue or weakness.  EYES: No blurred or double vision.  EARS, NOSE, AND THROAT: No tinnitus or ear pain.  RESPIRATORY: No cough, shortness of breath, wheezing or hemoptysis.  CARDIOVASCULAR: No chest pain, orthopnea, edema.  GASTROINTESTINAL: No nausea, vomiting, diarrhea or abdominal pain.  GENITOURINARY: No dysuria, hematuria.  ENDOCRINE: No polyuria, nocturia,  HEMATOLOGY: No anemia, easy bruising or bleeding SKIN: No rash or lesion. MUSCULOSKELETAL: No joint pain or arthritis.   Right hand swollen, fore arm swelling noted Tenderness right hand and fore arm NEUROLOGIC: No tingling, numbness, weakness.  PSYCHIATRY: No anxiety or depression.   MEDICATIONS AT HOME:  Prior to Admission medications   Medication Sig Start Date End Date Taking? Authorizing Provider  allopurinol (ZYLOPRIM) 100 MG tablet Take 1 tablet (100 mg total) by mouth daily. 05/21/16  Yes Leana Gamer, MD  amiodarone (PACERONE) 100 MG tablet Take 100 mg by mouth daily.   Yes Historical Provider, MD  calcitRIOL (ROCALTROL) 0.25 MCG capsule Take 0.25 mcg by mouth daily.   Yes Historical Provider, MD  epoetin alfa (EPOGEN,PROCRIT) 60454 UNIT/ML injection Inject into the skin 3 (three) times a week.   Yes Historical Provider, MD  fluticasone furoate-vilanterol (BREO ELLIPTA) 200-25 MCG/INH AEPB Inhale 1 puff into the lungs daily. 02/27/16  Yes Flora Lipps, MD  folic acid (FOLVITE) 1 MG tablet Take 1 mg by mouth daily.   Yes Historical Provider, MD  Nutritional Supplements (FEEDING SUPPLEMENT, NEPRO CARB STEADY,) LIQD Take 237 mLs by mouth 2 (two) times daily between meals. 05/21/16  Yes Leana Gamer, MD  OXYGEN Inhale into  the lungs.   Yes Historical Provider, MD  sodium bicarbonate 650 MG tablet Take 650 mg by mouth 2 (two) times daily.    Yes Historical Provider, MD  morphine (MSIR) 15 MG tablet Take 1 tablet (15 mg total) by mouth every 12 (twelve) hours as needed for severe pain. Patient not taking: Reported on 06/08/2016 05/21/16   Leana Gamer, MD      PHYSICAL EXAMINATION:   VITAL SIGNS: Blood pressure (!) 143/64, pulse 99, temperature 98.5 F (36.9 C), temperature source Oral, resp. rate (!) 22, height 5\' 5"  (1.651 m), weight 83.5 kg (184 lb), SpO2 99 %.  GENERAL:  66 y.o.-year-old patient lying in the bed with no acute distress.  EYES: Pupils equal, round, reactive to light and accommodation. No scleral icterus. Extraocular muscles intact.  HEENT: Head atraumatic, normocephalic. Oropharynx and nasopharynx clear.  NECK:  Supple, no jugular  venous distention. No thyroid enlargement, no tenderness.  LUNGS: Normal breath sounds bilaterally, no wheezing, rales,rhonchi or crepitation. No use of accessory muscles of respiration.  Porta cath noted on chest wall. CARDIOVASCULAR: S1, S2 normal. No murmurs, rubs, or gallops.  ABDOMEN: Soft, nontender, nondistended. Bowel sounds present. No organomegaly or mass.  EXTREMITIES: No pedal edema, cyanosis, or clubbing.  Right hand swelling and tenderness noted Right fore arm swollen and tender. NEUROLOGIC: Cranial nerves II through XII are intact. Muscle strength 5/5 in all extremities. Sensation intact. Gait not checked.  PSYCHIATRIC: The patient is alert and oriented x 3.  SKIN: No obvious rash, lesion, or ulcer.   LABORATORY PANEL:   CBC  Recent Labs Lab 06/03/16 1050 06/07/16 2214  WBC 13.0* 14.0*  HGB 7.2* 6.9*  HCT 22.9* 20.9*  PLT 265 213  MCV 97.8 93.6  MCH 30.9 30.7  MCHC 31.6* 32.9  RDW 26.2* 23.7*  LYMPHSABS 1.0  --   MONOABS 1.2*  --   EOSABS 0.3  --   BASOSABS 0.0  --     ------------------------------------------------------------------------------------------------------------------  Chemistries   Recent Labs Lab 06/03/16 1050 06/07/16 2214  NA 138 134*  K 5.4* 4.2  CL 111 102  CO2 22 24  GLUCOSE 107* 105*  BUN 26* 43*  CREATININE 2.15* 2.89*  CALCIUM 9.2 8.4*  AST 54* 40  ALT 55* 32  ALKPHOS 175* 171*  BILITOT 5.8* 5.7*   ------------------------------------------------------------------------------------------------------------------ estimated creatinine clearance is 20.7 mL/min (by C-G formula based on SCr of 2.89 mg/dL (H)). ------------------------------------------------------------------------------------------------------------------ No results for input(s): TSH, T4TOTAL, T3FREE, THYROIDAB in the last 72 hours.  Invalid input(s): FREET3   Coagulation profile No results for input(s): INR, PROTIME in the last 168 hours. ------------------------------------------------------------------------------------------------------------------- No results for input(s): DDIMER in the last 72 hours. -------------------------------------------------------------------------------------------------------------------  Cardiac Enzymes No results for input(s): CKMB, TROPONINI, MYOGLOBIN in the last 168 hours.  Invalid input(s): CK ------------------------------------------------------------------------------------------------------------------ Invalid input(s): POCBNP  ---------------------------------------------------------------------------------------------------------------  Urinalysis    Component Value Date/Time   COLORURINE AMBER (A) 05/14/2016 0405   APPEARANCEUR CLEAR 05/14/2016 0405   APPEARANCEUR Clear 05/02/2012 1631   LABSPEC 1.011 05/14/2016 0405   LABSPEC 1.009 05/02/2012 1631   PHURINE 5.0 05/14/2016 0405   GLUCOSEU NEGATIVE 05/14/2016 0405   GLUCOSEU Negative 05/02/2012 1631   HGBUR NEGATIVE 05/14/2016 0405    BILIRUBINUR SMALL (A) 05/14/2016 0405   BILIRUBINUR Negative 05/02/2012 1631   KETONESUR NEGATIVE 05/14/2016 0405   PROTEINUR NEGATIVE 05/14/2016 0405   NITRITE NEGATIVE 05/14/2016 0405   LEUKOCYTESUR MODERATE (A) 05/14/2016 0405   LEUKOCYTESUR Negative 05/02/2012 1631     RADIOLOGY: US Venous Img Upper Uni Right  Result Date: 06/08/2016 CLINICAL DATA:  Right hand swelling for 5 days. Recent hospitalization with IV is placed in the right arm. History of central line placement. Pain, edema, tobacco use, color changes. EXAM: Right UPPER EXTREMITY VENOUS DOPPLER ULTRASOUND TECHNIQUE: Gray-scale sonography with graded compression, as well as color Doppler and duplex ultrasound were performed to evaluate the upper extremity deep venous system from the level of the subclavian vein and including the jugular, axillary, basilic, radial, ulnar and upper cephalic vein. Spectral Doppler was utilized to evaluate flow at rest and with distal augmentation maneuvers. COMPARISON:  None. FINDINGS: Contralateral Subclavian Vein: Unable the visualize contralateral left subclavian vein due to Port-A-Cath placement with bandages. Internal Jugular Vein: No evidence of thrombus. Normal compressibility, respiratory phasicity and response to augmentation. Subclavian Vein: No evidence of thrombus. Normal compressibility, respiratory phasicity and response to augmentation.  Axillary Vein: No evidence of thrombus. Normal compressibility, respiratory phasicity and response to augmentation. Cephalic Vein: No evidence of thrombus. Normal compressibility, respiratory phasicity and response to augmentation. Basilic Vein: No evidence of thrombus. Normal compressibility, respiratory phasicity and response to augmentation. Brachial Veins: No evidence of thrombus. Normal compressibility, respiratory phasicity and response to augmentation. Radial Veins: No evidence of thrombus. Normal compressibility, respiratory phasicity and response to  augmentation. Ulnar Veins: No evidence of thrombus. Normal compressibility, respiratory phasicity and response to augmentation. Venous Reflux:  None visualized. Other Findings:  None visualized. IMPRESSION: No evidence of deep venous thrombosis. Electronically Signed   By: Lucienne Capers M.D.   On: 06/08/2016 02:27   Dg Hand Complete Right  Result Date: 06/07/2016 CLINICAL DATA:  Acute onset of right hand swelling. Initial encounter. EXAM: RIGHT HAND - COMPLETE 3+ VIEW COMPARISON:  None. FINDINGS: There is no evidence of fracture or dislocation. There is chronic widening of the scapholunate distance, which may be postoperative or degenerative in nature. There is diffuse osteopenia of visualized osseous structures. Mild negative ulnar variance is noted. Diffuse soft tissue swelling is noted about the hand and wrist. No radiopaque foreign bodies are seen. IMPRESSION: 1. No evidence of fracture or dislocation. 2. Diffuse soft tissue swelling about the hand and wrist. 3. Chronic widening of the scapholunate distance, which may be postoperative or degenerative in nature. 4. Diffuse osteopenia of visualized osseous structures. Electronically Signed   By: Garald Balding M.D.   On: 06/07/2016 20:57    EKG: Orders placed or performed during the hospital encounter of 05/13/16  . EKG 12-Lead  . EKG 12-Lead    IMPRESSION AND PLAN: 66 year old female patient with history of anemia of chronic disease, CKD stage V, congestive heart failure, COPD, hypertension, gout, sickle cell disease presented to the emergency room with swelling and pain in the right hand and right forearm. Admitting diagnosis 1. Right hand and right forearm cellulitis 2. Leukocytosis 3. Anemia of chronic disease 4. Sickle cell disease 5. Gout 6. Chronic kidney disease stage V 7. Hypertension Treatment plan Admit patient to medical floor inpatient service Start patient on IV vancomycin and IV Zosyn antibiotics broad-spectrum in view of  recent hospitalization Pain management Follow-up hemoglobin and hematocrit DVT prophylaxis subcutaneous heparin 5000 units every 8 hourly Monitor renal function Supportive care.  All the records are reviewed and case discussed with ED provider. Management plans discussed with the patient, family and they are in agreement.  CODE STATUS:FULL CODE    Code Status Orders        Start     Ordered   06/08/16 0333  Full code  Continuous     06/08/16 0333    Code Status History    Date Active Date Inactive Code Status Order ID Comments User Context   05/13/2016  9:58 PM 05/21/2016  7:50 PM Full Code [id number]  Lily Kocher, MD Inpatient   05/11/2016  2:25 PM 05/13/2016  5:39 PM Full Code [id number]  Bettey Costa, MD Inpatient   08/28/2015 10:49 AM 09/01/2015  4:17 PM Full Code [id number]  Epifanio Lesches, MD Inpatient   06/05/2015 11:05 AM 06/25/2015  4:27 AM Full Code [id number]  Gladstone Lighter, MD Inpatient       TOTAL TIME TAKING CARE OF THIS PATIENT: 53 minutes.    Saundra Shelling M.D on 06/08/2016 at 3:36 AM  Between 7am to 6pm - Pager - [phone]  After 6pm go to [email or web address] - password [email or web address] [facility] Hospital[facility]  Office  [phone]  CC: Primary care physician; Tommi Rumps, MD

## 2016-06-08 NOTE — Progress Notes (Signed)
PT Cancellation Note  Patient Details Name: Jane Short MRN: [id number] DOB: 31-Jul-1950   Cancelled Treatment:    Reason Eval/Treat Not Completed: Medical issues which prohibited therapy (6.2 Hgb and will ck later as time and pt allow once addresse)d.    Ramond Dial 06/08/2016, 4:27 PM   Mee Hives, PT MS Acute Rehab Dept. Number: [place] and [place]

## 2016-06-08 NOTE — ED Notes (Signed)
Patient to ultrasound via stretcher.

## 2016-06-08 NOTE — ED Notes (Signed)
Returned to room.

## 2016-06-08 NOTE — ED Notes (Signed)
Sitting up reclined at this time, awaiting ultrasound.

## 2016-06-08 NOTE — ED Notes (Addendum)
Patient remains in ultrasound.

## 2016-06-08 NOTE — Consult Note (Signed)
Jane Short  MRN : [id number]  Jane Short is a 66 y.o. (1950-08-14) female who presents with chief complaint of  Chief Complaint  Patient presents with  . Hand Pain  .  History of Present Illness: I am asked by Dr. Holley Raring to see the patient regarding marked right arm swelling. The swelling started several days ago. This is present in her forearm and hand. It is quite prominent. Although she denies any trauma or injury to the arm, she says she has been using a lot because of difficulties with ambulation secondary to gout. She reports no IVs on this side although it was mentioned in a previous Short that she had an IV placed in that arm last week. She has chronic kidney disease and had a left arm AV fistula placement last summer. 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 She has not had a subsequent dialysis access placement and is not currently on dialysis. She has a Port-A-Cath in the left chest which is her IV access and has been stable. She has absolutely no left arm pain or swelling at this time. She denies head or neck swelling and the swelling basically stops at her antecubital fossa on the right. As part of her workup she had a negative right upper extremity DVT study which showed no evidence of superficial or deep vein thrombosis in the right arm.  Current Facility-Administered Medications  Medication Dose Route Frequency Provider Last Rate Last Dose  . 0.9 %  sodium chloride infusion  250 mL Intravenous PRN Saundra Shelling, MD      . acetaminophen (TYLENOL) tablet 650 mg  650 mg Oral Q6H PRN Saundra Shelling, MD       Or  . acetaminophen (TYLENOL) suppository 650 mg  650 mg Rectal Q6H PRN Saundra Shelling, MD      . allopurinol (ZYLOPRIM) tablet 100 mg  100 mg Oral Daily Saundra Shelling, MD   100 mg at 06/08/16 [id number]  . amiodarone (PACERONE) tablet 100 mg  100 mg Oral Daily  Saundra Shelling, MD   100 mg at 06/08/16 [id number]  . calcitRIOL (ROCALTROL) capsule 0.25 mcg  0.25 mcg Oral Daily Saundra Shelling, MD   0.25 mcg at 06/08/16 0923  . ceFAZolin (ANCEF) IVPB 1 g/50 mL premix  1 g Intravenous Q12H Vipul Shah, MD      . feeding supplement (NEPRO CARB STEADY) liquid 237 mL  237 mL Oral BID BM Saundra Shelling, MD   237 mL at 06/08/16 0922  . fluticasone furoate-vilanterol (BREO ELLIPTA) 200-25 MCG/INH 1 puff  1 puff Inhalation Daily Saundra Shelling, MD   1 puff at 06/08/16 0926  . folic acid (FOLVITE) tablet 1 mg  1 mg Oral Daily Pavan Pyreddy, MD   1 mg at 06/08/16 0922  . heparin injection 5,000 Units  5,000 Units Subcutaneous Q8H Saundra Shelling, MD   5,000 Units at 06/08/16 1322  . HYDROcodone-acetaminophen (NORCO/VICODIN) 5-325 MG per tablet 1-2 tablet  1-2 tablet Oral Q4H PRN Saundra Shelling, MD   1 tablet at 06/08/16 0555  . morphine (MSIR) tablet 15 mg  15 mg Oral Q12H PRN Pavan Pyreddy, MD      . ondansetron (ZOFRAN) tablet 4 mg  4 mg Oral Q6H PRN Pavan Pyreddy, MD       Or  . ondansetron (ZOFRAN) injection 4 mg  4 mg Intravenous Q6H PRN Saundra Shelling, MD      .  senna-docusate (Senokot-S) tablet 1 tablet  1 tablet Oral QHS PRN Saundra Shelling, MD      . sodium bicarbonate tablet 650 mg  650 mg Oral BID Saundra Shelling, MD   650 mg at 06/08/16 0923  . sodium chloride flush (NS) 0.9 % injection 3 mL  3 mL Intravenous Q12H Pavan Pyreddy, MD   3 mL at 06/08/16 1000  . sodium chloride flush (NS) 0.9 % injection 3 mL  3 mL Intravenous PRN Saundra Shelling, MD       Facility-Administered Medications Ordered in Other Encounters  Medication Dose Route Frequency Provider Last Rate Last Dose  . 0.9 %  sodium chloride infusion  250 mL Intravenous Once Cammie Sickle, MD      . acetaminophen (TYLENOL) tablet 650 mg  650 mg Oral Once Cammie Sickle, MD      . acetaminophen (TYLENOL) tablet 650 mg  650 mg Oral Once Cammie Sickle, MD      . diphenhydrAMINE (BENADRYL) capsule 25  mg  25 mg Oral Once Cammie Sickle, MD      . diphenhydrAMINE (BENADRYL) capsule 25 mg  25 mg Oral Once Cammie Sickle, MD      . furosemide (LASIX) injection 20 mg  20 mg Intravenous Once Cammie Sickle, MD      . furosemide (LASIX) injection 20 mg  20 mg Intravenous Once Cammie Sickle, MD      . heparin lock flush 100 unit/mL  250 Units Intracatheter PRN Leia Alf, MD      . heparin lock flush 100 unit/mL  500 Units Intracatheter Daily PRN Cammie Sickle, MD      . heparin lock flush 100 unit/mL  250 Units Intracatheter PRN Cammie Sickle, MD      . heparin lock flush 100 unit/mL  500 Units Intracatheter Daily PRN Cammie Sickle, MD      . heparin lock flush 100 unit/mL  250 Units Intracatheter PRN Cammie Sickle, MD      . sodium chloride flush (NS) 0.9 % injection 10 mL  10 mL Intracatheter PRN Cammie Sickle, MD      . sodium chloride flush (NS) 0.9 % injection 10 mL  10 mL Intracatheter PRN Cammie Sickle, MD      . sodium chloride flush (NS) 0.9 % injection 3 mL  3 mL Intracatheter PRN Cammie Sickle, MD      . sodium chloride flush (NS) 0.9 % injection 3 mL  3 mL Intracatheter PRN Cammie Sickle, MD        Past Medical History:  Diagnosis Date  . Anemia   . 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 Cheree Ditto at [facility]  . CHF (congestive heart failure) ([place])   . CKD (chronic kidney disease), stage V ([place])   . Congestive heart failure (CHF) ([place])   . Congestive heart failure (CHF) ([place]) 06/2015  . COPD (chronic obstructive pulmonary disease) ([facility])    symptoms Dr. Patricia Pesa   . Gout   . Hypertension   . Lichen planus   . Osteoporosis 2011   osteopenia  . 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 Sickle cell anemia ([facility])    sickle cell disease  . Sickle cell anemia ([facility])   . Thrombocytopenia ([place])   . Vitamin D deficiency     Past Surgical History:  Procedure Laterality Date  . AV FISTULA  PLACEMENT Left 02/27/2016  Procedure: INSERTION OF ARTERIOVENOUS (AV) GORE-TEX GRAFT ARM ( FOREARM LOOP );  Surgeon: Algernon Huxley, MD;  Location: [facility]S;  Service: Vascular;  Laterality: Left;  . CHOLECYSTECTOMY    . COLONOSCOPY  2014   [facility]  . EYE SURGERY     cataract bilateral  . GALLBLADDER SURGERY    . PERIPHERAL VASCULAR CATHETERIZATION Left 06/14/2015   Procedure: Dialysis/Perma Catheter Insertion;  Surgeon: Katha Cabal, MD;  Location: [place] [facility];  Service: Cardiovascular;  Laterality: Left;  . PERIPHERAL VASCULAR CATHETERIZATION N/A 01/23/2016   Procedure: Glori Luis Cath Insertion;  Surgeon: Algernon Huxley, MD;  Location: [place] [facility];  Service: Cardiovascular;  Laterality: N/A;  . PERIPHERAL VASCULAR CATHETERIZATION Left 03/30/2016   Procedure: A/V Shuntogram/Fistulagram;  Surgeon: Algernon Huxley, MD;  Location: [place] [facility];  Service: Cardiovascular;  Laterality: Left;  . TUBAL LIGATION      Social History Social History  Substance Use Topics  . Smoking status: Former Smoker    Packs/day: 0.25    Years: 30.00    Types: Cigarettes    Quit date: 04/29/2015  . Smokeless tobacco: Never Used     Comment: quit 1 month ago  . Alcohol use No  No IV drug use  Family History Family History  Problem Relation Age of Onset  . Heart disease Father   . Diabetes Father   . Diabetes Sister   . Cancer Mother     breast cancer  . Cancer Maternal Aunt     Breast cancer    Allergies  Allergen Reactions  . Ramipril     cough  . Septra [Sulfamethoxazole-Trimethoprim]     Joint pain     REVIEW OF SYSTEMS (Negative unless checked)  Constitutional: [] Weight loss  [] Fever  [] Chills Cardiac: [] Chest pain   [] Chest pressure   [] Palpitations   [] Shortness of breath when laying flat   [] Shortness of breath at rest   [] Shortness of breath with exertion. Vascular:  [] Pain in legs with walking   [] Pain in legs at rest   [] Pain in legs when laying flat    [] Claudication   [] Pain in feet when walking  [] Pain in feet at rest  [] Pain in feet when laying flat   [] History of DVT   [] Phlebitis   [x] Swelling in legs   [] Varicose veins   [] Non-healing ulcers Pulmonary:   [] Uses home oxygen   [] Productive cough   [] Hemoptysis   [] Wheeze  [] COPD   [] Asthma Neurologic:  [] Dizziness  [] Blackouts   [] Seizures   [] History of stroke   [] History of TIA  [] Aphasia   [] Temporary blindness   [] Dysphagia   [x] Weakness or numbness in arms   [] Weakness or numbness in legs Musculoskeletal:  [x] Arthritis   [] Joint swelling   [x] Joint pain   [] Low back pain Hematologic:  [] Easy bruising  [] Easy bleeding   [] Hypercoagulable state   [x] Anemic  [] Hepatitis Gastrointestinal:  [] Blood in stool   [] Vomiting blood  [] Gastroesophageal reflux/heartburn   [] Difficulty swallowing. Genitourinary:  [x] Chronic kidney disease   [] Difficult urination  [] Frequent urination  [] Burning with urination   [] Blood in urine Skin:  [] Rashes   [] Ulcers   [] Wounds Psychological:  [] History of anxiety   []  History of major depression.  Physical Examination  Vitals:   06/08/16 0516 06/08/16 0831 06/08/16 1547 06/08/16 1612  BP: (!) 168/65 (!) 160/79 (!) 164/41 (!) 166/43  Pulse: 85 93 89 85  Resp: 19 12 18 18   Temp: 97.8  F (36.6 C) 98.5 F (36.9 C) 98.1 F (36.7 C) 98.5 F (36.9 C)  TempSrc: Oral Oral Oral Oral  SpO2: 98% 96% 94% 95%  Weight:      Height:       Body mass index is 30.62 kg/m. Gen:  WD/WN, NAD Head: [place]/AT, No temporalis wasting. Prominent temp pulse not noted. Ear/Nose/Throat: Hearing grossly intact, nares w/o erythema or drainage, oropharynx w/o Erythema/Exudate Eyes: Sclera non-icteric, conjunctiva clear Neck: Trachea midline.  No JVD.  Pulmonary:  Good air movement, respirations not labored, equal bilaterally.  Cardiac: RRR, normal S1, S2. Vascular: Port in place in the left chest which is currently accessed and being used for her IV Vessel Right Left  Radial  Palpable Palpable                  Femoral Palpable Palpable  Popliteal Palpable Palpable  PT Palpable Palpable  DP Palpable Palpable   Gastrointestinal: soft, non-tender/non-distended. No guarding/reflex.  Musculoskeletal: M/S 5/5 throughout.  Extremities without ischemic changes.  Mild bilateral lower extremity edema. Left arm has no edema and no erythema. Right arm has 2-3+ edema from her antecubital fossa down to her hand with the most prominent edema being in the hand and wrist area. Mildly tender to palpation. Neurologic: Sensation grossly intact in extremities.  Symmetrical.  Speech is fluent. Motor exam as listed above. Psychiatric: Judgment intact, Mood & affect appropriate for pt's clinical situation. Dermatologic: No rashes or ulcers noted.  No cellulitis or open wounds. Lymph : No Cervical, Axillary, or Inguinal lymphadenopathy.      CBC Lab Results  Component Value Date   WBC 13.1 (H) 06/08/2016   HGB 6.2 (L) 06/08/2016   HCT 18.9 (L) 06/08/2016   MCV 92.2 06/08/2016   PLT 215 06/08/2016    BMET    Component Value Date/Time   NA 134 (L) 06/08/2016 0535   NA 138 06/20/2012 1400   K 4.0 06/08/2016 0535   K 4.3 06/20/2012 1400   CL 104 06/08/2016 0535   CL 105 06/20/2012 1400   CO2 22 06/08/2016 0535   CO2 24 06/20/2012 1400   GLUCOSE 102 (H) 06/08/2016 0535   GLUCOSE 102 (H) 06/20/2012 1400   BUN 41 (H) 06/08/2016 0535   BUN 13 06/20/2012 1400   CREATININE 2.72 (H) 06/08/2016 0535   CREATININE 1.36 (H) 03/22/2013 1522   CALCIUM 8.3 (L) 06/08/2016 0535   CALCIUM 8.4 (L) 12/23/2015 1039   GFRNONAA 17 (L) 06/08/2016 0535   GFRNONAA 42 (L) 03/22/2013 1522   GFRAA 20 (L) 06/08/2016 0535   GFRAA 48 (L) 03/22/2013 1522   Estimated Creatinine Clearance: 22 mL/min (by C-G formula based on SCr of 2.72 mg/dL (H)).  COAG Lab Results  Component Value Date   INR 1.25 05/13/2016   INR 1.11 02/20/2016   INR 1.29 06/24/2015    Radiology Ct Abdomen Pelvis Wo  Contrast  Result Date: 05/14/2016 CLINICAL DATA:  66 y/o F; decreasing hemoglobin with concern for retroperitoneal hemorrhage or hemorrhage at the painful left hip. History of sickle cell disease and cholecystectomy. EXAM: CT ABDOMEN AND PELVIS WITHOUT CONTRAST TECHNIQUE: Multidetector CT imaging of the abdomen and pelvis was performed following the standard protocol without IV contrast. COMPARISON:  05/17/2015 CT abdomen and pelvis. FINDINGS: Lower chest: Severe cardiomegaly. Decreased attenuation of cardiac blood pool compatible with anemia. Hepatobiliary: No focal liver abnormality is seen. Status post cholecystectomy. No biliary dilatation. Pancreas: Unremarkable. No pancreatic ductal dilatation or surrounding inflammatory changes. Spleen:  Status post splenectomy. Adrenals/Urinary Tract: Adrenal glands are unremarkable. Stable left kidney interpolar 26 mm cyst. Kidneys are otherwise normal, without renal calculi, focal lesion, or hydronephrosis. Bladder is unremarkable. Stomach/Bowel: Stomach is within normal limits. Appendix appears normal. No evidence of bowel wall thickening, distention, or inflammatory changes. Few scattered diverticulum of the colon. Vascular/Lymphatic: Aortic atherosclerosis with moderate calcifications. No enlarged abdominal or pelvic lymph nodes. Reproductive: Uterus and bilateral adnexa are unremarkable. Other: No abdominal wall hernia or abnormality. No abdominopelvic ascites. Musculoskeletal: Mixed lucent and sclerotic foci in the right femoral head, left proximal femur, bilateral ilium, and small lucencies in vertebral bodies probably representing multiple bone infarcts given history of sickle cell disease. IMPRESSION: 1. No hemorrhage identified. 2. Severe cardiomegaly. 3. Aortic atherosclerosis. 4. Bony stigmata of sickle cell disease with interval right femoral head avascular necrosis. Electronically Signed   By: Kristine Garbe M.D.   On: 05/14/2016 05:18   Dg Chest  1 View  Result Date: 05/13/2016 CLINICAL DATA:  Hypoxia, history sickle cell anemia, COPD EXAM: CHEST 1 VIEW COMPARISON:  Portable exam 1550 hours compared 06/22/2015 FINDINGS: LEFT jugular Port-A-Cath with tip projecting over SVC. Enlargement of cardiac silhouette with pulmonary vascular congestion. Aortic atherosclerosis. Lungs grossly clear. No definite infiltrate, pleural effusion or pneumothorax. No acute osseous findings. IMPRESSION: Enlargement of cardiac silhouette with pulmonary vascular congestion. No acute abnormalities. Electronically Signed   By: Lavonia Dana M.D.   On: 05/13/2016 16:05   Dg Knee 1-2 Views Left  Result Date: 05/18/2016 CLINICAL DATA:  Left knee pain. EXAM: LEFT KNEE - 1-2 VIEW COMPARISON:  None. FINDINGS: No acute fracture or dislocation. Osteochondral lesion involving the patella. Large joint effusion. Severe osteopenia. IMPRESSION: Osteochondral lesion of the patella.  Large joint effusion. Electronically Signed   By: Kathreen Devoid   On: 05/18/2016 11:15   Ct Head Wo Contrast  Result Date: 05/14/2016 CLINICAL DATA:  66 year old female with acute encephalopathy. The patient is lethargic, disoriented, and generalized weakness. EXAM: CT HEAD WITHOUT CONTRAST TECHNIQUE: Contiguous axial images were obtained from the base of the skull through the vertex without intravenous contrast. COMPARISON:  None. FINDINGS: Brain: The ventricles and sulci are appropriate in size for patient's age. Mild periventricular and deep white matter chronic microvascular ischemic changes noted. There is no acute intracranial hemorrhage. No mass effect or midline shift noted. Vascular: No hyperdense vessel or unexpected calcification. Skull: There is diffuse ground-glass appearance of the skull with "Salt and pepper" appearance may be related to underlying hyperparathyroidism. Multiple lucent lesions within the skull may represent brown tumor of the bone secondary to hyperparathyroidism. Correlation  with clinical exam and parathyroid function tests recommended. Sinuses/Orbits: No acute finding. Other: None IMPRESSION: No acute intracranial pathology. Mild age-related atrophy and chronic microvascular ischemic changes. Ground-glass appearance of the skull suggestive of hyperparathyroidism. Correlation with clinical exam and blood calcium levels recommended. Electronically Signed   By: Anner Crete M.D.   On: 05/14/2016 05:17   Mr Lumbar Spine Wo Contrast  Result Date: 05/14/2016 CLINICAL DATA:  Back pain at L4-5.  History of sickle cell anemia. EXAM: MRI LUMBAR SPINE WITHOUT CONTRAST TECHNIQUE: Multiplanar, multisequence MR imaging of the lumbar spine was performed. No intravenous contrast was administered. Patient was unable to complete examination due to pain, axial T1 sequence not obtained. COMPARISON:  CT abdomen and pelvis May 14, 2016 at 0432 hours FINDINGS: Mild motion degraded examination. SEGMENTATION: For the purposes of this report, the last well-formed intervertebral disc will be described as L5-S1. ALIGNMENT:  Maintenance of the lumbar lordosis. Minimal grade 1 L4-5 anterolisthesis without spondylolysis. VERTEBRAE:Markedly decreased bone marrow signal on all sequences compatible with bone marrow replacement/reactivation. Vertebral bodies are intact. Mild L4-5 disc height loss, with slight desiccation wall this suspected. Multilevel mild chronic discogenic endplate changes. Focal bright T1 and bright T2 signal in the LEFT ventral L5 vertebral body most compatible with hemangioma. CONUS MEDULLARIS: Conus medullaris terminates at L1-2 and demonstrates normal morphology and signal characteristics. Limited assessment of cauda equina due to mild patient motion and lack of T1 sequence. PARASPINAL AND SOFT TISSUES: Included prevertebral and paraspinal soft tissues are nonsuspicious, moderate symmetric paraspinal muscle atrophy. Renal cysts better seen on today's CT abdomen and pelvis. DISC  LEVELS: L1-2 and L2-3: Potential annular fissures without significant disc bulge, canal stenosis or neural foraminal narrowing. L3-4: Small LEFT extraforaminal disc protrusion could affect the exited LEFT L4 nerve. Mild facet arthropathy and ligamentum flavum redundancy. Mild RIGHT, mild to moderate LEFT neural foraminal narrowing. L4-5: Widened facets to 5 mm with RIGHT greater than LEFT moderate facet arthropathy. Moderate broad-based disc bulge asymmetric to the RIGHT encroaches upon the exited RIGHT L4 nerve. Mild canal stenosis. Moderate to severe RIGHT, mild LEFT neural foraminal narrowing. L5-S1: Small broad-based disc bulge. Mild facet arthropathy and ligamentum flavum redundancy without canal stenosis. Mild RIGHT neural foraminal narrowing. IMPRESSION: Abnormally decreased bone marrow signal attributed to patient's sickle cell anemia though can be seen with other myeloproliferative disorders. Moderate L4-5 facet arthropathy, widened facets associated inflammatory changes and dynamic instability. Minimal grade 1 L4-5 anterolisthesis. This would be better characterized on flexion extension radiographs. Mild canal stenosis L4-5. Neural foraminal narrowing L3-4 through L5-S1: Moderate to severe at L4-5. Electronically Signed   By: Elon Alas M.D.   On: 05/14/2016 22:42   US Venous Img Upper Uni Right  Result Date: 06/08/2016 CLINICAL DATA:  Right hand swelling for 5 days. Recent hospitalization with IV is placed in the right arm. History of central line placement. Pain, edema, tobacco use, color changes. EXAM: Right UPPER EXTREMITY VENOUS DOPPLER ULTRASOUND TECHNIQUE: Gray-scale sonography with graded compression, as well as color Doppler and duplex ultrasound were performed to evaluate the upper extremity deep venous system from the level of the subclavian vein and including the jugular, axillary, basilic, radial, ulnar and upper cephalic vein. Spectral Doppler was utilized to evaluate flow at rest  and with distal augmentation maneuvers. COMPARISON:  None. FINDINGS: Contralateral Subclavian Vein: Unable the visualize contralateral left subclavian vein due to Port-A-Cath placement with bandages. Internal Jugular Vein: No evidence of thrombus. Normal compressibility, respiratory phasicity and response to augmentation. Subclavian Vein: No evidence of thrombus. Normal compressibility, respiratory phasicity and response to augmentation. Axillary Vein: No evidence of thrombus. Normal compressibility, respiratory phasicity and response to augmentation. Cephalic Vein: No evidence of thrombus. Normal compressibility, respiratory phasicity and response to augmentation. Basilic Vein: No evidence of thrombus. Normal compressibility, respiratory phasicity and response to augmentation. Brachial Veins: No evidence of thrombus. Normal compressibility, respiratory phasicity and response to augmentation. Radial Veins: No evidence of thrombus. Normal compressibility, respiratory phasicity and response to augmentation. Ulnar Veins: No evidence of thrombus. Normal compressibility, respiratory phasicity and response to augmentation. Venous Reflux:  None visualized. Other Findings:  None visualized. IMPRESSION: No evidence of deep venous thrombosis. Electronically Signed   By: Lucienne Capers M.D.   On: 06/08/2016 02:27   Dg Hand Complete Right  Result Date: 06/07/2016 CLINICAL DATA:  Acute onset of right hand swelling. Initial encounter. EXAM: RIGHT HAND -  COMPLETE 3+ VIEW COMPARISON:  None. FINDINGS: There is no evidence of fracture or dislocation. There is chronic widening of the scapholunate distance, which may be postoperative or degenerative in nature. There is diffuse osteopenia of visualized osseous structures. Mild negative ulnar variance is noted. Diffuse soft tissue swelling is noted about the hand and wrist. No radiopaque foreign bodies are seen. IMPRESSION: 1. No evidence of fracture or dislocation. 2. Diffuse soft  tissue swelling about the hand and wrist. 3. Chronic widening of the scapholunate distance, which may be postoperative or degenerative in nature. 4. Diffuse osteopenia of visualized osseous structures. Electronically Signed   By: Garald Balding M.D.   On: 06/07/2016 20:57   Dg Hip Unilat With Pelvis 2-3 Views Left  Result Date: 05/11/2016 CLINICAL DATA:  Hip pain. EXAM: DG HIP (WITH OR WITHOUT PELVIS) 2-3V LEFT COMPARISON:  None. FINDINGS: There is no evidence of hip fracture or dislocation. There is no evidence of arthropathy or other focal bone abnormality. IMPRESSION: Normal left hip. Electronically Signed   By: Marijo Conception, M.D.   On: 05/11/2016 15:59      Assessment/Plan 1. Right arm swelling. The etiology is not clear to me. She has not had any access procedures on the right side, her central venous catheter is on the left side, and she denies having recent IVs on the right arm although that was reported in the previous history. She had a negative DVT study so this does not appear to be a right upper extremity thrombotic event. Although the possibility of the central venous thrombosis or stenosis is present, if this were present I would expected to be on the left side and she has absolutely no left arm symptoms. Her AV fistula that was placed on the left arm failed due to a small vein, and she currently does not have a dialysis access device in either upper extremity. We can perform a venogram to evaluate the central venous structures but at this point I think the yield is not very high. There is no indication for anticoagulation at this point. I would elevate the arm, use warm compresses as needed. 2. Anemia of chronic disease and sickle cell anemia. Transfuse as needed. 3. Chronic kidney disease. Nephrology following. No indication for dialysis at this time. 4. Hypertension. Stable on outpatient medications.   Leotis Pain, MD  06/08/2016 4:38 PM    This Short was created with Dragon  medical transcription system.  Any error is purely unintentional

## 2016-06-08 NOTE — Progress Notes (Signed)
[facility] [place] NAME: Jane Short    MR#:  [id number]  DATE OF BIRTH:  16-Jan-1951  SUBJECTIVE:  CHIEF COMPLAINT:   Chief Complaint  Patient presents with  . Hand Pain  hand swelling but no redness on rt arm/hand REVIEW OF SYSTEMS:  Review of Systems  Constitutional: Positive for malaise/fatigue. Negative for chills, fever and weight loss.  HENT: Negative for nosebleeds and sore throat.   Eyes: Negative for blurred vision.  Respiratory: Negative for cough, shortness of breath and wheezing.   Cardiovascular: Negative for chest pain, orthopnea, leg swelling and PND.  Gastrointestinal: Negative for abdominal pain, constipation, diarrhea, heartburn, nausea and vomiting.  Genitourinary: Negative for dysuria and urgency.  Musculoskeletal: Positive for joint pain. Negative for back pain.  Skin: Negative for rash.  Neurological: Positive for weakness. Negative for dizziness, speech change, focal weakness and headaches.  Endo/Heme/Allergies: Does not bruise/bleed easily.  Psychiatric/Behavioral: Negative for depression.    DRUG ALLERGIES:   Allergies  Allergen Reactions  . Ramipril     cough  . Septra [Sulfamethoxazole-Trimethoprim]     Joint pain   VITALS:  Blood pressure (!) 174/68, pulse 84, temperature 98.1 F (36.7 C), temperature source Oral, resp. rate 16, height 5\' 5"  (1.651 m), weight 83.5 kg (184 lb), SpO2 94 %. PHYSICAL EXAMINATION:  Physical Exam  Constitutional: She is oriented to person, place, and time and well-developed, well-nourished, and in no distress.  HENT:  Head: Normocephalic and atraumatic.  Eyes: Conjunctivae and EOM are normal. Pupils are equal, round, and reactive to light.  Neck: Normal range of motion. Neck supple. No tracheal deviation present. No thyromegaly present.  Cardiovascular: Normal rate, regular rhythm and normal heart sounds.   Pulmonary/Chest: Effort normal and breath sounds normal.  No respiratory distress. She has no wheezes. She exhibits no tenderness.  Abdominal: Soft. Bowel sounds are normal. She exhibits no distension. There is no tenderness.  Musculoskeletal: Normal range of motion.  Neurological: She is alert and oriented to person, place, and time. No cranial nerve deficit.  Skin: Skin is warm and dry. No rash noted.  Rt hand swelling, no redness  Psychiatric: Mood and affect normal.   LABORATORY PANEL:   CBC  Recent Labs Lab 06/08/16 0535 06/08/16 1935  WBC 13.1*  --   HGB 6.2* 7.0*  HCT 18.9* 21.4*  PLT 215  --    ------------------------------------------------------------------------------------------------------------------ Chemistries   Recent Labs Lab 06/07/16 2214 06/08/16 0535  NA 134* 134*  K 4.2 4.0  CL 102 104  CO2 24 22  GLUCOSE 105* 102*  BUN 43* 41*  CREATININE 2.89* 2.72*  CALCIUM 8.4* 8.3*  AST 40  --   ALT 32  --   ALKPHOS 171*  --   BILITOT 5.7*  --    RADIOLOGY:  US Venous Img Upper Uni Right  Result Date: 06/08/2016 CLINICAL DATA:  Right hand swelling for 5 days. Recent hospitalization with IV is placed in the right arm. History of central line placement. Pain, edema, tobacco use, color changes. EXAM: Right UPPER EXTREMITY VENOUS DOPPLER ULTRASOUND TECHNIQUE: Gray-scale sonography with graded compression, as well as color Doppler and duplex ultrasound were performed to evaluate the upper extremity deep venous system from the level of the subclavian vein and including the jugular, axillary, basilic, radial, ulnar and upper cephalic vein. Spectral Doppler was utilized to evaluate flow at rest and with distal augmentation maneuvers. COMPARISON:  None. FINDINGS: Contralateral Subclavian Vein: Unable  the visualize contralateral left subclavian vein due to Port-A-Cath placement with bandages. Internal Jugular Vein: No evidence of thrombus. Normal compressibility, respiratory phasicity and response to augmentation. Subclavian  Vein: No evidence of thrombus. Normal compressibility, respiratory phasicity and response to augmentation. Axillary Vein: No evidence of thrombus. Normal compressibility, respiratory phasicity and response to augmentation. Cephalic Vein: No evidence of thrombus. Normal compressibility, respiratory phasicity and response to augmentation. Basilic Vein: No evidence of thrombus. Normal compressibility, respiratory phasicity and response to augmentation. Brachial Veins: No evidence of thrombus. Normal compressibility, respiratory phasicity and response to augmentation. Radial Veins: No evidence of thrombus. Normal compressibility, respiratory phasicity and response to augmentation. Ulnar Veins: No evidence of thrombus. Normal compressibility, respiratory phasicity and response to augmentation. Venous Reflux:  None visualized. Other Findings:  None visualized. IMPRESSION: No evidence of deep venous thrombosis. Electronically Signed   By: Lucienne Capers M.D.   On: 06/08/2016 02:27   Dg Hand Complete Right  Result Date: 06/07/2016 CLINICAL DATA:  Acute onset of right hand swelling. Initial encounter. EXAM: RIGHT HAND - COMPLETE 3+ VIEW COMPARISON:  None. FINDINGS: There is no evidence of fracture or dislocation. There is chronic widening of the scapholunate distance, which may be postoperative or degenerative in nature. There is diffuse osteopenia of visualized osseous structures. Mild negative ulnar variance is noted. Diffuse soft tissue swelling is noted about the hand and wrist. No radiopaque foreign bodies are seen. IMPRESSION: 1. No evidence of fracture or dislocation. 2. Diffuse soft tissue swelling about the hand and wrist. 3. Chronic widening of the scapholunate distance, which may be postoperative or degenerative in nature. 4. Diffuse osteopenia of visualized osseous structures. Electronically Signed   By: Garald Balding M.D.   On: 06/07/2016 20:57   ASSESSMENT AND PLAN:  66 year old female patient with  history of anemia of chronic disease, CKD stage V, congestive heart failure, COPD, hypertension, gout, sickle cell disease presented to the emergency room with swelling and pain in the right hand and right forearm.  1. Right hand and right forearm cellulitis: responded to IV abx. Stop vanco & zosyn. Start ancef IV. Due to swelling in rt arm - vascular surgery c/s to r/o any central stenosis. Not a candidate for anticoagulation. Could be due to recent IV in that forearm. No DVT 2. Leukocytosis: due to 1 3. Anemia of chronic disease and also sickle cell anemia: Hb 6.2 and symptomatic, will order 1 prbc 4. Sickle cell disease: stable 5. Gout: uric acid 9.4 6. Chronic kidney disease stage V 7. Uncontrolled Hypertension: add hydralazine. Continue amiodarone and lasix.   All the records are reviewed and case discussed with Care Management/Social Worker. Management plans discussed with the patient, family, dr Holley Raring and they are in agreement.  CODE STATUS: FULL CODE  TOTAL TIME TAKING CARE OF THIS PATIENT: 25 minutes.   More than 50% of the time was spent in counseling/coordination of care: YES  POSSIBLE D/C IN 1-2 DAYS, DEPENDING ON CLINICAL CONDITION.   Max Sane M.D on 06/08/2016 at 8:18 PM  Between 7am to 6pm - Pager - [phone]  After 6pm go to [email or web address] - Proofreader  [facility]  Office  [phone]  CC: Primary care physician; Tommi Rumps, MD  Note: This dictation was prepared with Dragon dictation along with smaller phrase technology. Any transcriptional errors that result from this process are unintentional.

## 2016-06-08 NOTE — Progress Notes (Signed)
MD paged regarding elevated BP's, MD will write appropriate orders.

## 2016-06-08 NOTE — Progress Notes (Signed)
Pharmacy Antibiotic Note  Jane Short is a 66 y.o. female admitted on 06/07/2016 with cellulitis.  Pharmacy has been consulted for vancomycin and Zosyn dosing.  Plan: DW 68kg  Vd 48L kei 0.022 hr-1  T1/2 32 hours Vancomycin 1 gram q 36 hours ordered with stacked dosing. Level before 4th dose. Goal trough 15-20.  Zosyn 4.5 grams q 8 hours ordered for Pseudomonas risk of abx received in last 3 months.  Height: 5\' 5"  (165.1 cm) Weight: 184 lb (83.5 kg) IBW/kg (Calculated) : 57  Temp (24hrs), Avg:98.3 F (36.8 C), Min:97.8 F (36.6 C), Max:98.5 F (36.9 C)   Recent Labs Lab 06/03/16 1050 06/07/16 2214 06/08/16 0535  WBC 13.0* 14.0* 13.1*  CREATININE 2.15* 2.89* 2.72*    Estimated Creatinine Clearance: 22 mL/min (by C-G formula based on SCr of 2.72 mg/dL (H)).    Allergies  Allergen Reactions  . Ramipril     cough  . Septra [Sulfamethoxazole-Trimethoprim]     Joint pain    Antimicrobials this admission: vancomycin 1/8 >>  Zosyn 1/8 >>   Dose adjustments this admission: Will decrease Zosyn to 3.375g IV q8h EI, while patient does have risk factors for pseudomonas potential renal toxicities of the higher dose out weigh the benefits.  Microbiology results: 1/8 BCx: pending 05/14/16 MRSA PCR: (-)    Thank you for allowing pharmacy to be a part of this patient's care.  Paulina Fusi, PharmD, BCPS 06/08/2016 11:22 AM

## 2016-06-08 NOTE — ED Notes (Signed)
Patient reports return of pain.  Continue to await ultrasound.

## 2016-06-09 ENCOUNTER — Telehealth: Payer: Self-pay | Admitting: Family Medicine

## 2016-06-09 LAB — CBC
HCT: 18.9 % — ABNORMAL LOW (ref 35.0–47.0)
Hemoglobin: 6.4 g/dL — ABNORMAL LOW (ref 12.0–16.0)
MCH: 29.8 pg (ref 26.0–34.0)
MCHC: 33.9 g/dL (ref 32.0–36.0)
MCV: 87.8 fL (ref 80.0–100.0)
PLATELETS: 220 10*3/uL (ref 150–440)
RBC: 2.16 MIL/uL — AB (ref 3.80–5.20)
RDW: 24.2 % — ABNORMAL HIGH (ref 11.5–14.5)
WBC: 10.2 10*3/uL (ref 3.6–11.0)

## 2016-06-09 LAB — BASIC METABOLIC PANEL
ANION GAP: 9 (ref 5–15)
BUN: 38 mg/dL — AB (ref 6–20)
CO2: 24 mmol/L (ref 22–32)
Calcium: 8.7 mg/dL — ABNORMAL LOW (ref 8.9–10.3)
Chloride: 103 mmol/L (ref 101–111)
Creatinine, Ser: 2.63 mg/dL — ABNORMAL HIGH (ref 0.44–1.00)
GFR, EST AFRICAN AMERICAN: 21 mL/min — AB (ref >60)
GFR, EST NON AFRICAN AMERICAN: 18 mL/min — AB (ref >60)
Glucose, Bld: 105 mg/dL — ABNORMAL HIGH (ref 65–99)
POTASSIUM: 3.7 mmol/L (ref 3.5–5.1)
SODIUM: 136 mmol/L (ref 135–145)

## 2016-06-09 LAB — TYPE AND SCREEN
Blood Product Expiration Date: 201801262359
ISSUE DATE / TIME: 201801081550
UNIT TYPE AND RH: 9500

## 2016-06-09 MED ORDER — IBUPROFEN 400 MG PO TABS: 400.0000 mg | ORAL_TABLET | Freq: Every day | ORAL | 0 refills | Status: AC | PRN

## 2016-06-09 MED ORDER — CEPHALEXIN 250 MG PO CAPS: 250.0000 mg | ORAL_CAPSULE | Freq: Two times a day (BID) | ORAL | 0 refills | Status: DC

## 2016-06-09 NOTE — Progress Notes (Signed)
PT Cancellation Note  Patient Details Name: Jane Short MRN: [id number] DOB: 08-10-1950   Cancelled Treatment:    Reason Eval/Treat Not Completed: Medical issues which prohibited therapy Pt still with low H/H, waiting on work for transfusion - HGB is 6.4.  Will hold PT until numbers are more acceptable/stable.   Kreg Shropshire 06/09/2016, 11:02 AM

## 2016-06-09 NOTE — Discharge Instructions (Signed)
Cellulitis, Adult Introduction Cellulitis is a skin infection. The infected area is usually red and sore. This condition occurs most often in the arms and lower legs. It is very important to get treated for this condition. Follow these instructions at home:  Take over-the-counter and prescription medicines only as told by your doctor.  If you were prescribed an antibiotic medicine, take it as told by your doctor. Do not stop taking the antibiotic even if you start to feel better.  Drink enough fluid to keep your pee (urine) clear or pale yellow.  Do not touch or rub the infected area.  Raise (elevate) the infected area above the level of your heart while you are sitting or lying down.  Place warm or cold wet cloths (warm or cold compresses) on the infected area. Do this as told by your doctor.  Keep all follow-up visits as told by your doctor. This is important. These visits let your doctor make sure your infection is not getting worse. Contact a doctor if:  You have a fever.  Your symptoms do not get better after 1-2 days of treatment.  Your bone or joint under the infected area starts to hurt after the skin has healed.  Your infection comes back. This can happen in the same area or another area.  You have a swollen bump in the infected area.  You have new symptoms.  You feel ill and also have muscle aches and pains. Get help right away if:  Your symptoms get worse.  You feel very sleepy.  You throw up (vomit) or have watery poop (diarrhea) for a long time.  There are red streaks coming from the infected area.  Your red area gets larger.  Your red area turns darker. This information is not intended to replace advice given to you by your health care provider. Make sure you discuss any questions you have with your health care provider. Document Released: 11/04/2007 Document Revised: 10/24/2015 Document Reviewed: 03/27/2015  2017 Elsevier   Edema Edema is an  abnormal buildup of fluids. It is more common in your legs and thighs. Painless swelling of the feet and ankles is more likely as a person ages. It also is common in looser skin, like around your eyes. Follow these instructions at home:  Keep the affected body part above the level of the heart while lying down.  Do not sit still or stand for a long time.  Do not put anything right under your knees when you lie down.  Do not wear tight clothes on your upper legs.  Exercise your legs to help the puffiness (swelling) go down.  Wear elastic bandages or support stockings as told by your doctor.  A low-salt diet may help lessen the puffiness.  Only take medicine as told by your doctor. Contact a doctor if:  Treatment is not working.  You have heart, liver, or kidney disease and notice that your skin looks puffy or shiny.  You have puffiness in your legs that does not get better when you raise your legs.  You have sudden weight gain for no reason. Get help right away if:  You have shortness of breath or chest pain.  You cannot breathe when you lie down.  You have pain, redness, or warmth in the areas that are puffy.  You have heart, liver, or kidney disease and get edema all of a sudden.  You have a fever and your symptoms get worse all of a sudden. This  information is not intended to replace advice given to you by your health care provider. Make sure you discuss any questions you have with your health care provider. Document Released: 11/04/2007 Document Revised: 10/24/2015 Document Reviewed: 03/10/2013 Elsevier Interactive Patient Education  2017 [employer].

## 2016-06-09 NOTE — Progress Notes (Signed)
Hgb 6.4. MD paged. Awaiting response.

## 2016-06-09 NOTE — Telephone Encounter (Signed)
[facility] called to schedule pt for a one week HFU with Dr. Caryl Bis. Pt is scheduled for 06/16/16, she was in for cellulitis and abscess on her rt hand and forearm.

## 2016-06-09 NOTE — Progress Notes (Signed)
[facility]  Patient Status: Active  [facility] is providing the following services: PT  If patient discharges after hours, please call [phone].   Jane Short 06/09/2016, 8:51 AM

## 2016-06-09 NOTE — Evaluation (Signed)
Physical Therapy Evaluation Patient Details Name: Jane Short MRN: [id number] DOB: 1951-05-21 Today's Date: 06/09/2016   History of Present Illness  66 yo female here with R wrist cellulitis/pain.  She has chronic anemia (sickle cell, HGB typically <7).  PMH:  sickle cell disease, CKD, anemia, COPD, CHF, chronic pain, valvular heart disease, OSA.    Clinical Impression  Pt initially hesitant to participate secondary to R wrist and knee pain (requesting a walker secondary to pain despite normally not needing one).  She ultimately showed enough strength and mobility to walk 200 ft (100 w/o AD) and generally was safe and confident.  She had some caution/guarding with R LE WBing, but had no LOBs and no significant safety issues. Pt is not at her baseline and will benefit from HHPT, but should be able to return home w/o issue.     Follow Up Recommendations Home health PT    Equipment Recommendations       Recommendations for Other Services       Precautions / Restrictions Restrictions Weight Bearing Restrictions: No      Mobility  Bed Mobility Overal bed mobility: Modified Independent Bed Mobility: Supine to Sit     Supine to sit: Min assist     General bed mobility comments: Pt shows good confidence getting to EOB despite c/o wrist and knee pain on R  Transfers Overall transfer level: Modified independent Equipment used: Rolling walker (2 wheeled) Transfers: Sit to/from Stand Sit to Stand: Min guard         General transfer comment: Pt unable to use R UE to assist to standing, shows good effort and ability to maintain balance once upright  Ambulation/Gait Ambulation/Gait assistance: Min guard Ambulation Distance (Feet): 200 Feet Assistive device: Rolling walker (2 wheeled);None       General Gait Details: slow but steady gait.  Pt using walker fist 100 ft, only touching R UE to walker to keep it steady (no WBing).  She was able to do the last 100 ft w/o AD and  though she was guarded she had good safety and despite some lack of confidence and R knee pain  Stairs            Wheelchair Mobility    Modified Rankin (Stroke Patients Only)       Balance Overall balance assessment: Needs assistance   Sitting balance-Leahy Scale: Good       Standing balance-Leahy Scale: Fair                               Pertinent Vitals/Pain Pain Assessment: 0-10 Pain Score: 7  Pain Location: R wrist/hand and now including R knee    Home Living Family/patient expects to be discharged to:: Private residence Living Arrangements: Spouse/significant other Available Help at Discharge: Family Type of Home: House Home Access: Stairs to enter Entrance Stairs-Rails: Right;Left;Can reach both Technical brewer of Steps: 5          Prior Function Level of Independence: Independent with assistive device(s)         Comments: reports she ambulates using SPC in home and uses WC for long distances     Hand Dominance        Extremity/Trunk Assessment   Upper Extremity Assessment Upper Extremity Assessment:  (L WNL, R pain limited, v. guarded, not tested/poor function)    Lower Extremity Assessment Lower Extremity Assessment: RLE deficits/detail RLE Deficits / Details: Pt c/o  pain with any R knee movement, hesistant to fully participate, showed poor tolerance with R LE movement       Communication   Communication: No difficulties  Cognition Arousal/Alertness: Awake/alert Behavior During Therapy: WFL for tasks assessed/performed Overall Cognitive Status: Within Functional Limits for tasks assessed                      General Comments      Exercises     Assessment/Plan    PT Assessment Patient needs continued PT services  PT Problem List Decreased strength;Decreased activity tolerance;Decreased range of motion;Decreased balance;Decreased mobility;Decreased coordination;Decreased knowledge of use of  DME;Obesity;Decreased skin integrity;Pain          PT Treatment Interventions DME instruction;Gait training;Functional mobility training;Therapeutic activities;Therapeutic exercise;Balance training;Patient/family education    PT Goals (Current goals can be found in the Care Plan section)  Acute Rehab PT Goals Patient Stated Goal: go home PT Goal Formulation: With patient/family Time For Goal Achievement: 06/23/16 Potential to Achieve Goals: Fair    Frequency Min 2X/week   Barriers to discharge        Co-evaluation               End of Session Equipment Utilized During Treatment: Gait belt Activity Tolerance: Patient limited by pain Patient left: with chair alarm set;with call bell/phone within reach;with family/visitor present           Time: [id number] PT Time Calculation (min) (ACUTE ONLY): 28 min   Charges:   PT Evaluation $PT Eval Low Complexity: 1 Procedure     PT G CodesKreg Shropshire, DPT 06/09/2016, 2:10 PM

## 2016-06-09 NOTE — Progress Notes (Signed)
[facility]  ROUNDING NOTE   Subjective:  Patient reports that her right hand swelling is a bit better. She received blood transfusion yesterday however hemoglobin is back down to 6.4 again today. Also appreciate input from vascular surgery.  Objective:  Vital signs in last 24 hours:  Temp:  [98.1 F (36.7 C)-98.8 F (37.1 C)] 98.3 F (36.8 C) (01/09 0822) Pulse Rate:  [78-90] 78 (01/09 0822) Resp:  [16-20] 16 (01/09 0822) BP: (122-174)/(41-68) 122/58 (01/09 0822) SpO2:  [94 %-98 %] 96 % (01/09 0822)  Weight change:  Filed Weights   06/07/16 2028  Weight: 83.5 kg (184 lb)    Intake/Output: I/O last 3 completed shifts: In: 3094.8 [P.O.:1020; I.V.:1135; Blood:310; IV Piggyback:629.8] Out: [id number] [Urine:1650]   Intake/Output this shift:  Total I/O In: 480 [P.O.:480] Out: -   Physical Exam: General: No acute distress  Head: Normocephalic, atraumatic. Moist oral mucosal membranes  Eyes: Anicteric  Neck: Supple, trachea midline  Lungs:  Clear to auscultation, normal effort  Heart: S1S2 no rubs  Abdomen:  Soft, nontender  Extremities: 2+ RUE swelling , trace b/l LE edema  Neurologic: Nonfocal, moving all four extremities  Skin: No lesions  Access: Clotted LUE AVF    Basic Metabolic Panel:  Recent Labs Lab 06/03/16 1050 06/07/16 2214 06/08/16 0535 06/09/16 0525  NA 138 134* 134* 136  K 5.4* 4.2 4.0 3.7  CL 111 102 104 103  CO2 22 24 22 24   GLUCOSE 107* 105* 102* 105*  BUN 26* 43* 41* 38*  CREATININE 2.15* 2.89* 2.72* 2.63*  CALCIUM 9.2 8.4* 8.3* 8.7*    Liver Function Tests:  Recent Labs Lab 06/03/16 1050 06/07/16 2214  AST 54* 40  ALT 55* 32  ALKPHOS 175* 171*  BILITOT 5.8* 5.7*  PROT 6.4* 7.1  ALBUMIN 3.4* 3.4*   No results for input(s): LIPASE, AMYLASE in the last 168 hours. No results for input(s): AMMONIA in the last 168 hours.  CBC:  Recent Labs Lab 06/03/16 1050 06/07/16 2214 06/08/16 0535 06/08/16 1935  06/09/16 0525  WBC 13.0* 14.0* 13.1*  --  10.2  NEUTROABS 10.5*  --   --   --   --   HGB 7.2* 6.9* 6.2* 7.0* 6.4*  HCT 22.9* 20.9* 18.9* 21.4* 18.9*  MCV 97.8 93.6 92.2  --  87.8  PLT 265 213 215  --  220    Cardiac Enzymes: No results for input(s): CKTOTAL, CKMB, CKMBINDEX, TROPONINI in the last 168 hours.  BNP: Invalid input(s): POCBNP  CBG: No results for input(s): GLUCAP in the last 168 hours.  Microbiology: Results for orders placed or performed during the hospital encounter of 06/07/16  Blood culture (routine x 2)     Status: None (Preliminary result)   Collection Time: 06/08/16  2:57 AM  Result Value Ref Range Status   Specimen Description BLOOD PORTA CATH LEFT  Final   Special Requests BOTTLES DRAWN AEROBIC AND ANAEROBIC 7CCAERO,8CCANA  Final   Culture NO GROWTH 1 DAY  Final   Report Status PENDING  Incomplete  Blood culture (routine x 2)     Status: None (Preliminary result)   Collection Time: 06/08/16  2:57 AM  Result Value Ref Range Status   Specimen Description BLOOD RIGHT FOREARM  Final   Special Requests   Final    BOTTLES DRAWN AEROBIC AND ANAEROBIC 11CCAERO,15CCANA   Culture NO GROWTH 1 DAY  Final   Report Status PENDING  Incomplete    Coagulation Studies: No results  for input(s): LABPROT, INR in the last 72 hours.  Urinalysis: No results for input(s): COLORURINE, LABSPEC, PHURINE, GLUCOSEU, HGBUR, BILIRUBINUR, KETONESUR, PROTEINUR, UROBILINOGEN, NITRITE, LEUKOCYTESUR in the last 72 hours.  Invalid input(s): APPERANCEUR    Imaging: US Venous Img Upper Uni Right  Result Date: 06/08/2016 CLINICAL DATA:  Right hand swelling for 5 days. Recent hospitalization with IV is placed in the right arm. History of central line placement. Pain, edema, tobacco use, color changes. EXAM: Right UPPER EXTREMITY VENOUS DOPPLER ULTRASOUND TECHNIQUE: Gray-scale sonography with graded compression, as well as color Doppler and duplex ultrasound were performed to evaluate the  upper extremity deep venous system from the level of the subclavian vein and including the jugular, axillary, basilic, radial, ulnar and upper cephalic vein. Spectral Doppler was utilized to evaluate flow at rest and with distal augmentation maneuvers. COMPARISON:  None. FINDINGS: Contralateral Subclavian Vein: Unable the visualize contralateral left subclavian vein due to Port-A-Cath placement with bandages. Internal Jugular Vein: No evidence of thrombus. Normal compressibility, respiratory phasicity and response to augmentation. Subclavian Vein: No evidence of thrombus. Normal compressibility, respiratory phasicity and response to augmentation. Axillary Vein: No evidence of thrombus. Normal compressibility, respiratory phasicity and response to augmentation. Cephalic Vein: No evidence of thrombus. Normal compressibility, respiratory phasicity and response to augmentation. Basilic Vein: No evidence of thrombus. Normal compressibility, respiratory phasicity and response to augmentation. Brachial Veins: No evidence of thrombus. Normal compressibility, respiratory phasicity and response to augmentation. Radial Veins: No evidence of thrombus. Normal compressibility, respiratory phasicity and response to augmentation. Ulnar Veins: No evidence of thrombus. Normal compressibility, respiratory phasicity and response to augmentation. Venous Reflux:  None visualized. Other Findings:  None visualized. IMPRESSION: No evidence of deep venous thrombosis. Electronically Signed   By: Lucienne Capers M.D.   On: 06/08/2016 02:27   Dg Hand Complete Right  Result Date: 06/07/2016 CLINICAL DATA:  Acute onset of right hand swelling. Initial encounter. EXAM: RIGHT HAND - COMPLETE 3+ VIEW COMPARISON:  None. FINDINGS: There is no evidence of fracture or dislocation. There is chronic widening of the scapholunate distance, which may be postoperative or degenerative in nature. There is diffuse osteopenia of visualized osseous structures.  Mild negative ulnar variance is noted. Diffuse soft tissue swelling is noted about the hand and wrist. No radiopaque foreign bodies are seen. IMPRESSION: 1. No evidence of fracture or dislocation. 2. Diffuse soft tissue swelling about the hand and wrist. 3. Chronic widening of the scapholunate distance, which may be postoperative or degenerative in nature. 4. Diffuse osteopenia of visualized osseous structures. Electronically Signed   By: Garald Balding M.D.   On: 06/07/2016 20:57     Medications:    . allopurinol  100 mg Oral Daily  . amiodarone  100 mg Oral Daily  . calcitRIOL  0.25 mcg Oral Daily  .  ceFAZolin (ANCEF) IV  1 g Intravenous Q12H  . feeding supplement (NEPRO CARB STEADY)  237 mL Oral BID BM  . fluticasone furoate-vilanterol  1 puff Inhalation Daily  . folic acid  1 mg Oral Daily  . heparin  5,000 Units Subcutaneous Q8H  . hydrALAZINE  25 mg Oral Q8H  . sodium bicarbonate  650 mg Oral BID  . sodium chloride flush  3 mL Intravenous Q12H   sodium chloride, acetaminophen **OR** acetaminophen, hydrALAZINE, HYDROcodone-acetaminophen, morphine, ondansetron **OR** ondansetron (ZOFRAN) IV, senna-docusate, sodium chloride flush  Assessment/ Plan:  66 y.o. female with sickle cell disease, atrial myxoma who presents a follow up   1. Acute Renal  Failure requiring hemodialysis on Chronic Kidney Disease stage IV with hyperkalemia and metabolic acidosis.  Pt had recent ARF at [facility] and received one HD treatment.  Chronic Kidney Disease is secondary to NSAID use and sickle cell anemia.  - Renal function appears to be stable. Creatinine currently 2.6. Therefore no indication to start dialysis at the moment. Continue to monitor renal function trend.  2. 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, Dr. Rogue Bussing - Patient received one unit transfusion yesterday however hemoglobin back down to 6.4 today. Management per hospitalist and  hematology.  3. Right upper extremity edema. - Right upper stomach edema appears to be slightly less prominent today. Appreciate input from vascular surgery. No plans for venogram at the moment. Continue to elevate the right arm.  4. Gout: with recent acute gout flare  - continue low-dose allopurinol.  5. Secondary Hyperparathyroidism: continue calcitriol.   LOS: 1 Jane Short 1/9/201811:27 AM

## 2016-06-09 NOTE — Telephone Encounter (Signed)
[facility] called back and stated that pt will need a referral for Rheumatology and that Dr. Barb Merino is currently accepting new patients.

## 2016-06-09 NOTE — Care Management (Signed)
Patient admitted with ARF.  Hx of sickle cell.  Lives at home with husband. Open with [facility] for PT.  PCP Son[place].  When I attempted my assessment of home equipment patient states "i already have everything I need at the house".   Resumption of home health orders placed.  MD also ordered RN, OT, and aide.  Corene Cornea with [facility] notified of discharge plan RN signing off

## 2016-06-09 NOTE — Progress Notes (Signed)
Patient discharged to home. Discharge summary given to patient.

## 2016-06-09 NOTE — Progress Notes (Signed)
OT Cancellation Note  Patient Details Name: Jane Short MRN: [id number] DOB: [birth year]   Cancelled Treatment:    Reason Eval/Treat Not Completed: Medical issues which prohibited therapy.  Order received and chart reviewed.  Pt's hemoglobin is still low at 6.4.  Will hold OT evaluation per protocol and re-assess again tomorrow.  Chrys Racer, OTR/L ascom [phone] 06/09/16, 8:52 AM

## 2016-06-10 ENCOUNTER — Inpatient Hospital Stay: Payer: Medicare Other

## 2016-06-10 NOTE — Telephone Encounter (Signed)
Called patient for TCM no answer left voicemail will continue to try and call.

## 2016-06-11 NOTE — Telephone Encounter (Signed)
Takled with patient sister listed on DPR she advised she would give message to patient and have her return call sister did not want answer any questions.

## 2016-06-11 NOTE — Telephone Encounter (Signed)
Left message for patient to return call to office. Second attempt to make TCM call

## 2016-06-11 NOTE — Telephone Encounter (Signed)
Transition Care Management Follow-up Telephone Call  How have you been since you were released from the hospital? Feel pretty good , but I don't feel my progress is good as it should be.   Do you understand why you were in the hospital? Yes, for cellulitis in right hand.   Do you understand the discharge instrcutions? Yes.  Items Reviewed:  Medications reviewed: Yes, I am on a new Antibiotic for 4 days cephalexin 250 mg BID.  Allergies reviewed: Yes  Dietary changes reviewed: yes , low sodium  Referrals reviewed: Yes   Functional Questionnaire:   Activities of Daily Living (ADLs):   She states they are independent in the following:   Patient stated she can dress herself, I can bathe, feed my self. States they require assistance with the following: Sister prepares meals and husband cleans house.   Any transportation issues/concerns?: NO   Any patient concerns? NO   Confirmed importance and date/time of follow-up visits scheduled: Yes,   Confirmed with patient if condition begins to worsen call PCP or go to the ER.  Patient was given the Call-a-Nurse line [phone]: Yes,

## 2016-06-11 NOTE — Telephone Encounter (Signed)
Called patient husband (DPR in chart) no answer left message to call office.

## 2016-06-11 NOTE — Telephone Encounter (Signed)
Please call pt at [phone]

## 2016-06-12 ENCOUNTER — Inpatient Hospital Stay: Payer: Medicare Other

## 2016-06-12 NOTE — Discharge Summary (Signed)
[place] [facility] [place] NAME: Jane Short    MR#:  [id number]  DATE OF BIRTH:  [birth year]  DATE OF ADMISSION:  06/07/2016   ADMITTING PHYSICIAN: Saundra Shelling, MD  DATE OF DISCHARGE: 06/09/2016  4:22 PM  PRIMARY CARE PHYSICIAN: Tommi Rumps, MD   ADMISSION DIAGNOSIS:  Swelling [R60.9] Cellulitis of right upper extremity [id number] DISCHARGE DIAGNOSIS:  Principal Problem:   Cellulitis and abscess of hand Active Problems:   Cellulitis  SECONDARY DIAGNOSIS:   Past Medical History:  Diagnosis Date  . Anemia   . 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 Cheree Ditto at [facility]  . CHF (congestive heart failure) ([place])   . CKD (chronic kidney disease), stage V ([place])   . Congestive heart failure (CHF) ([place])   . Congestive heart failure (CHF) ([place]) 06/2015  . COPD (chronic obstructive pulmonary disease) (HCC)    symptoms Dr. Patricia Pesa   . Gout   . Hypertension   . Lichen planus   . Osteoporosis 2011   osteopenia  . 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 Sickle cell anemia (HCC)    sickle cell disease  . Sickle cell anemia (HCC)   . Thrombocytopenia ([place])   . Vitamin D deficiency    HOSPITAL COURSE:  66 year old female patient with history of anemia of chronic disease, CKDstage V, congestive heart failure, COPD, hypertension, gout, sickle cell disease admitted with swelling and pain in the right hand and right forearm.  1.Right hand and right forearm cellulitis/swelling: no DVT, Not a candidate for anticoagulation. Could be due to recent IV in that forearm. Much improved. 2.Leukocytosis: due to 1 3.Anemia of chronic disease and also sickle cell anemia: Hb 6.2 and symptomatic, s/p 1 prbc transfusion 4.Sickle cell disease: stable 5.Gout: uric acid 9.4. Recommend outpt Rheumatology f/up 6.Chronic kidney disease stage V 7.Uncontrolled Hypertension: add hydralazine. Continue amiodarone and lasix.  DISCHARGE CONDITIONS:    stable CONSULTS OBTAINED:  Treatment Team:  Anthonette Legato, MD DRUG ALLERGIES:   Allergies  Allergen Reactions  . Ramipril     cough  . Septra [Sulfamethoxazole-Trimethoprim]     Joint pain   DISCHARGE MEDICATIONS:   Allergies as of 06/09/2016      Reactions   Ramipril    cough   Septra [sulfamethoxazole-trimethoprim]    Joint pain      Medication List    STOP taking these medications   morphine 15 MG tablet Commonly known as:  MSIR     TAKE these medications   allopurinol 100 MG tablet Commonly known as:  ZYLOPRIM Take 1 tablet (100 mg total) by mouth daily.   amiodarone 100 MG tablet Commonly known as:  PACERONE Take 100 mg by mouth daily.   calcitRIOL 0.25 MCG capsule Commonly known as:  ROCALTROL Take 0.25 mcg by mouth daily.   cephALEXin 250 MG capsule Commonly known as:  KEFLEX Take 1 capsule (250 mg total) by mouth 2 (two) times daily.   epoetin alfa 40000 UNIT/ML injection Commonly known as:  EPOGEN,PROCRIT Inject into the skin 3 (three) times a week.   feeding supplement (NEPRO CARB STEADY) Liqd Take 237 mLs by mouth 2 (two) times daily between meals.   fluticasone furoate-vilanterol 200-25 MCG/INH Aepb Commonly known as:  BREO ELLIPTA Inhale 1 puff into the lungs daily.   folic acid 1 MG tablet Commonly known as:  FOLVITE Take 1 mg by mouth daily.   ibuprofen 400 MG tablet Commonly  known as:  ADVIL,MOTRIN Take 1 tablet (400 mg total) by mouth daily as needed for moderate pain.   OXYGEN Inhale into the lungs.   sodium bicarbonate 650 MG tablet Take 650 mg by mouth 2 (two) times daily.        DISCHARGE INSTRUCTIONS:   DIET:  Regular diet DISCHARGE CONDITION:  Good ACTIVITY:  Activity as tolerated OXYGEN:  Home Oxygen: No.  Oxygen Delivery: room air DISCHARGE LOCATION:  home   If you experience worsening of your admission symptoms, develop shortness of breath, life threatening emergency, suicidal or homicidal thoughts  you must seek medical attention immediately by calling 911 or calling your MD immediately  if symptoms less severe.  You Must read complete instructions/literature along with all the possible adverse reactions/side effects for all the Medicines you take and that have been prescribed to you. Take any new Medicines after you have completely understood and accpet all the possible adverse reactions/side effects.   Please note  You were cared for by a hospitalist during your hospital stay. If you have any questions about your discharge medications or the care you received while you were in the hospital after you are discharged, you can call the unit and asked to speak with the hospitalist on call if the hospitalist that took care of you is not available. Once you are discharged, your primary care physician will handle any further medical issues. Please note that NO REFILLS for any discharge medications will be authorized once you are discharged, as it is imperative that you return to your primary care physician (or establish a relationship with a primary care physician if you do not have one) for your aftercare needs so that they can reassess your need for medications and monitor your lab values.    On the day of Discharge:  VITAL SIGNS:  Blood pressure (!) 131/52, pulse 78, temperature 98.3 F (36.8 C), temperature source Oral, resp. rate 16, height 5\' 5"  (1.651 m), weight 83.5 kg (184 lb), SpO2 96 %. PHYSICAL EXAMINATION:  GENERAL:  66 y.o.-year-old patient lying in the bed with no acute distress.  EYES: Pupils equal, round, reactive to light and accommodation. No scleral icterus. Extraocular muscles intact.  HEENT: Head atraumatic, normocephalic. Oropharynx and nasopharynx clear.  NECK:  Supple, no jugular venous distention. No thyroid enlargement, no tenderness.  LUNGS: Normal breath sounds bilaterally, no wheezing, rales,rhonchi or crepitation. No use of accessory muscles of respiration.   CARDIOVASCULAR: S1, S2 normal. No murmurs, rubs, or gallops.  ABDOMEN: Soft, non-tender, non-distended. Bowel sounds present. No organomegaly or mass.  EXTREMITIES: No pedal edema, cyanosis, or clubbing.  NEUROLOGIC: Cranial nerves II through XII are intact. Muscle strength 5/5 in all extremities. Sensation intact. Gait not checked.  PSYCHIATRIC: The patient is alert and oriented x 3.  SKIN: No obvious rash, lesion, or ulcer.  DATA REVIEW:   CBC  Recent Labs Lab 06/09/16 0525  WBC 10.2  HGB 6.4*  HCT 18.9*  PLT 220    Chemistries   Recent Labs Lab 06/07/16 2214  06/09/16 0525  NA 134*  < > 136  K 4.2  < > 3.7  CL 102  < > 103  CO2 24  < > 24  GLUCOSE 105*  < > 105*  BUN 43*  < > 38*  CREATININE 2.89*  < > 2.63*  CALCIUM 8.4*  < > 8.7*  AST 40  --   --   ALT 32  --   --  ALKPHOS 171*  --   --   BILITOT 5.7*  --   --   < > = values in this interval not displayed.   Follow-up Information    Tommi Rumps, MD. Go on 06/16/2016.   Specialty:  Family Medicine Why:  Tuesday at 11:30am for hospital follow-up Contact information: [address] [address] [phone]        Emmaline Kluver, MD. Schedule an appointment as soon as possible for a visit in 2 weeks.   Specialty:  Rheumatology Why:  Dr. Caryl Bis office will send a referral for rheumatalogy follow-up for gout Contact information: [place] [id number] [id number]        Anthonette Legato, MD. Go on 06/23/2016.   Specialty:  Internal Medicine Why:  Tuesday at 10:00am for hospital follow-up with Dr. Sharren Bridge information: [place] [address] [phone]           Management plans discussed with the patient, family and they are in agreement.  CODE STATUS: FULL CODE  TOTAL TIME TAKING CARE OF THIS PATIENT: 45 minutes.    Max Sane M.D on 06/12/2016 at 6:07 PM  Between 7am to  6pm - Pager - [phone]  After 6pm go to [email or web address] - Proofreader  [facility]  Office  [phone]  CC: Primary care physician; Tommi Rumps, MD   Note: This dictation was prepared with Dragon dictation along with smaller phrase technology. Any transcriptional errors that result from this process are unintentional.

## 2016-06-13 LAB — CULTURE, BLOOD (ROUTINE X 2)
CULTURE: NO GROWTH
Culture: NO GROWTH

## 2016-06-15 ENCOUNTER — Other Ambulatory Visit: Payer: Self-pay | Admitting: Internal Medicine

## 2016-06-15 ENCOUNTER — Inpatient Hospital Stay: Payer: Medicare Other

## 2016-06-15 ENCOUNTER — Other Ambulatory Visit: Payer: Self-pay | Admitting: *Deleted

## 2016-06-15 VITALS — BP 119/71 | HR 79 | Temp 99.2°F | Resp 18

## 2016-06-15 DIAGNOSIS — N184 Chronic kidney disease, stage 4 (severe): Principal | ICD-10-CM

## 2016-06-15 DIAGNOSIS — Z79899 Other long term (current) drug therapy: Secondary | ICD-10-CM | POA: Diagnosis not present

## 2016-06-15 DIAGNOSIS — D631 Anemia in chronic kidney disease: Secondary | ICD-10-CM

## 2016-06-15 DIAGNOSIS — N189 Chronic kidney disease, unspecified: Principal | ICD-10-CM

## 2016-06-15 DIAGNOSIS — I129 Hypertensive chronic kidney disease with stage 1 through stage 4 chronic kidney disease, or unspecified chronic kidney disease: Secondary | ICD-10-CM | POA: Diagnosis present

## 2016-06-15 DIAGNOSIS — D571 Sickle-cell disease without crisis: Secondary | ICD-10-CM

## 2016-06-15 LAB — HEMATOCRIT: HEMATOCRIT: 17 % — AB (ref 35.0–47.0)

## 2016-06-15 LAB — PREPARE RBC (CROSSMATCH)

## 2016-06-15 LAB — HEMOGLOBIN: HEMOGLOBIN: 5.8 g/dL — AB (ref 12.0–16.0)

## 2016-06-15 LAB — SAMPLE TO BLOOD BANK

## 2016-06-15 MED ORDER — EPOETIN ALFA 40000 UNIT/ML IJ SOLN
40000.0000 [IU] | Freq: Once | INTRAMUSCULAR | Status: AC
  Administered 2016-06-15: 40000 [IU] via SUBCUTANEOUS
  Filled 2016-06-15: qty 1

## 2016-06-15 MED ORDER — HEPARIN SOD (PORK) LOCK FLUSH 100 UNIT/ML IV SOLN
500.0000 [IU] | Freq: Once | INTRAVENOUS | Status: AC
  Administered 2016-06-15: 500 [IU] via INTRAVENOUS

## 2016-06-15 MED ORDER — ACETAMINOPHEN 325 MG PO TABS
650.0000 mg | ORAL_TABLET | Freq: Once | ORAL | Status: AC
  Administered 2016-06-15: 650 mg via ORAL
  Filled 2016-06-15: qty 2

## 2016-06-15 MED ORDER — SODIUM CHLORIDE 0.9 % IV SOLN
INTRAVENOUS | Status: DC
  Administered 2016-06-15: 13:00:00 via INTRAVENOUS
  Filled 2016-06-15: qty 1000

## 2016-06-15 MED ORDER — DIPHENHYDRAMINE HCL 25 MG PO CAPS
25.0000 mg | ORAL_CAPSULE | Freq: Once | ORAL | Status: AC
  Administered 2016-06-15: 25 mg via ORAL
  Filled 2016-06-15: qty 1

## 2016-06-15 MED ORDER — FUROSEMIDE 10 MG/ML IJ SOLN
20.0000 mg | Freq: Once | INTRAMUSCULAR | Status: AC
  Administered 2016-06-15: 20 mg via INTRAVENOUS
  Filled 2016-06-15: qty 2

## 2016-06-15 NOTE — Progress Notes (Signed)
Blood bank orders released- spoke with blood bank.

## 2016-06-16 ENCOUNTER — Ambulatory Visit (INDEPENDENT_AMBULATORY_CARE_PROVIDER_SITE_OTHER): Payer: Medicare Other | Admitting: Family Medicine

## 2016-06-16 ENCOUNTER — Encounter: Payer: Self-pay | Admitting: Family Medicine

## 2016-06-16 ENCOUNTER — Other Ambulatory Visit: Payer: Self-pay | Admitting: *Deleted

## 2016-06-16 ENCOUNTER — Telehealth: Payer: Self-pay | Admitting: *Deleted

## 2016-06-16 VITALS — BP 126/60 | HR 63 | Temp 97.5°F | Wt 190.2 lb

## 2016-06-16 DIAGNOSIS — L03113 Cellulitis of right upper limb: Secondary | ICD-10-CM

## 2016-06-16 DIAGNOSIS — N184 Chronic kidney disease, stage 4 (severe): Secondary | ICD-10-CM

## 2016-06-16 DIAGNOSIS — D571 Sickle-cell disease without crisis: Secondary | ICD-10-CM | POA: Diagnosis not present

## 2016-06-16 LAB — HEMOGLOBIN AND HEMATOCRIT, BLOOD
HCT: 20.8 % — CL (ref 36.0–46.0)
Hemoglobin: 7 g/dL — CL (ref 12.0–15.0)

## 2016-06-16 NOTE — Telephone Encounter (Signed)
Please let the patient know that her hemoglobin was 7.0. This is above her baseline and improved from yesterday when she had a blood transfusion. She should try to keep her appointment tomorrow with hematology unless the weather is too poor to travel. Thanks.

## 2016-06-16 NOTE — Assessment & Plan Note (Addendum)
She'll continue to follow with nephrology for this. Discussed avoidance of NSAIDs for treatment of pain and advised on Tylenol 1000 mg 2-3 times a day for pain.

## 2016-06-16 NOTE — Telephone Encounter (Signed)
Patient notified

## 2016-06-16 NOTE — Progress Notes (Signed)
  Tommi Rumps, MD Phone: [phone]  Jane Short is a 66 y.o. female who presents today for follow-up.  Patient was hospitalized for right hand cellulitis. She was placed on Keflex for this. She had an ultrasound of her upper extremity as well that ruled out DVT. She has been doing well since discharge. She notes no warmth or erythema to the hand. There is still some swelling. She has finished the antibiotics. She notes no fevers. Swelling is slowly going away.  Patient does note she had a blood transfusion yesterday after having her hemoglobin checked and it being 5.8. She notes her baseline hemoglobin is around 6.4. Typically never higher than this unless she gets a transfusion given her sickle cell disease. She felt a little weak prior to the transfusion though feels well now. No chest pain or shortness of breath. No palpitations. No lightheadedness.  She is additionally continuing to follow with nephrology. Still produces urine. Was placed on ibuprofen at discharge to help with discomfort from her cellulitis.  PMH: Former smoker   ROS see history of present illness  Objective  Physical Exam Vitals:   06/16/16 1129  BP: 126/60  Pulse: 63  Temp: 97.5 F (36.4 C)    BP Readings from Last 3 Encounters:  06/16/16 126/60  06/15/16 119/71  06/09/16 (!) 131/52   Wt Readings from Last 3 Encounters:  06/16/16 190 lb 3.2 oz (86.3 kg)  06/07/16 184 lb (83.5 kg)  05/28/16 190 lb (86.2 kg)    Physical Exam  Constitutional: No distress.  HENT:  Head: Normocephalic and atraumatic.  Mouth/Throat: Oropharynx is clear and moist.  Eyes: Pupils are equal, round, and reactive to light. Scleral icterus (mild and unchanged from previously) is present.  Cardiovascular: Normal rate, regular rhythm and normal heart sounds.   No lower extremity edema  Pulmonary/Chest: Effort normal and breath sounds normal.  Musculoskeletal:  Right hand with 1+ pitting edema, no induration, warmth,  or erythema, sensation light touch intact, no swelling in the rest of her arm on the right side, left hand appears normal with no swelling, warmth, or erythema  Neurological: She is alert. Gait normal.  Skin: Skin is warm and dry. She is not diaphoretic.     Assessment/Plan: Please see individual problem list.  Hb-SS disease without crisis ([place]) Overall doing well. Did receive a blood transfusion yesterday. She reports her baseline hemoglobin is about 6.4 and on review of her most recent hematology note it appears that it is 6-6.5. Given the approaching inclement weather and the potential for office closures tomorrow when she has her follow-up appointment for repeat hemoglobin at the hematologist office we will go ahead and check her hemoglobin. If near baseline she'll keep her follow-up with them. If lower than baseline would potentially need to consider transfusion again.  Cellulitis Improved from previously. Still some swelling though no signs of infection. Advised to continue to monitor. Keep her hand elevated to help with resolution of her swelling. Monitor for recurrence of symptoms.  CKD (chronic kidney disease), stage IV ([place]) She'll continue to follow with nephrology for this. Discussed avoidance of NSAIDs for treatment of pain and advised on Tylenol 1000 mg 2-3 times a day for pain.  Patient's medications were reviewed in full with her.  Orders Placed This Encounter  Procedures  . Hemoglobin and hematocrit, blood    Tommi Rumps, MD [place]

## 2016-06-16 NOTE — Assessment & Plan Note (Signed)
Improved from previously. Still some swelling though no signs of infection. Advised to continue to monitor. Keep her hand elevated to help with resolution of her swelling. Monitor for recurrence of symptoms.

## 2016-06-16 NOTE — Progress Notes (Signed)
Pre visit review using our clinic review tool, if applicable. No additional management support is needed unless otherwise documented below in the visit note. 

## 2016-06-16 NOTE — Patient Instructions (Signed)
Nice to see you. We will check some lab work today and contact you with the results. Please monitor your right hand and if it worsens or you develop new symptoms please seek medical attention. You should elevate your hand to try to get the swelling out of it. Please contact your vascular surgeon with your questions regarding your dialysis access site. If you develop fevers, redness, warmth, increased swelling, or any new or change in symptoms please seek medical attention immediately.

## 2016-06-16 NOTE — Progress Notes (Signed)
Patient came by cancer center on 06/15/16- has moved to [place], [place] and would like to establish care in hematology clinic- with Dr. Samuel Germany. Needs referral sent to office with records. Fax [phone]. Referral entered in chl.

## 2016-06-16 NOTE — Telephone Encounter (Signed)
Left message to return call 

## 2016-06-16 NOTE — Assessment & Plan Note (Addendum)
Overall doing well. Did receive a blood transfusion yesterday. She reports her baseline hemoglobin is about 6.4 and on review of her most recent hematology note it appears that it is 6-6.5. Given the approaching inclement weather and the potential for office closures tomorrow when she has her follow-up appointment for repeat hemoglobin at the hematologist office we will go ahead and check her hemoglobin. If near baseline she'll keep her follow-up with them. If lower than baseline would potentially need to consider transfusion again.

## 2016-06-16 NOTE — Telephone Encounter (Signed)
Critical Lab Results  Hgb: 7.0 Hct: 20.8

## 2016-06-17 ENCOUNTER — Inpatient Hospital Stay: Payer: Medicare Other

## 2016-06-17 ENCOUNTER — Ambulatory Visit: Payer: Medicare Other | Admitting: Internal Medicine

## 2016-06-17 ENCOUNTER — Other Ambulatory Visit: Payer: Medicare Other

## 2016-06-17 ENCOUNTER — Inpatient Hospital Stay: Payer: Medicare Other | Admitting: Internal Medicine

## 2016-06-17 LAB — TYPE AND SCREEN
ABO/RH(D): O POS
Antibody Screen: NEGATIVE
Unit division: 0

## 2016-06-19 ENCOUNTER — Inpatient Hospital Stay: Payer: Medicare Other

## 2016-06-22 ENCOUNTER — Other Ambulatory Visit: Payer: Self-pay | Admitting: Internal Medicine

## 2016-06-22 ENCOUNTER — Ambulatory Visit
Admission: RE | Admit: 2016-06-22 | Discharge: 2016-06-22 | Disposition: A | Discharge status: Home / Self Care | Payer: Medicare Other | Source: Ambulatory Visit | Attending: Internal Medicine | Admitting: Internal Medicine

## 2016-06-22 ENCOUNTER — Inpatient Hospital Stay: Payer: Medicare Other

## 2016-06-22 ENCOUNTER — Telehealth: Payer: Self-pay | Admitting: *Deleted

## 2016-06-22 ENCOUNTER — Other Ambulatory Visit: Payer: Self-pay | Admitting: *Deleted

## 2016-06-22 VITALS — BP 111/67 | HR 88 | Temp 98.0°F

## 2016-06-22 DIAGNOSIS — R0602 Shortness of breath: Secondary | ICD-10-CM

## 2016-06-22 DIAGNOSIS — I517 Cardiomegaly: Secondary | ICD-10-CM | POA: Diagnosis not present

## 2016-06-22 DIAGNOSIS — D631 Anemia in chronic kidney disease: Secondary | ICD-10-CM

## 2016-06-22 DIAGNOSIS — D571 Sickle-cell disease without crisis: Secondary | ICD-10-CM | POA: Insufficient documentation

## 2016-06-22 DIAGNOSIS — R509 Fever, unspecified: Secondary | ICD-10-CM

## 2016-06-22 DIAGNOSIS — I700 Atherosclerosis of aorta: Secondary | ICD-10-CM | POA: Diagnosis not present

## 2016-06-22 DIAGNOSIS — N184 Chronic kidney disease, stage 4 (severe): Principal | ICD-10-CM

## 2016-06-22 DIAGNOSIS — I509 Heart failure, unspecified: Secondary | ICD-10-CM | POA: Diagnosis not present

## 2016-06-22 DIAGNOSIS — N189 Chronic kidney disease, unspecified: Principal | ICD-10-CM

## 2016-06-22 DIAGNOSIS — I129 Hypertensive chronic kidney disease with stage 1 through stage 4 chronic kidney disease, or unspecified chronic kidney disease: Secondary | ICD-10-CM | POA: Diagnosis not present

## 2016-06-22 LAB — HEMATOCRIT: HEMATOCRIT: 14.2 % — AB (ref 35.0–47.0)

## 2016-06-22 LAB — CBC WITH DIFFERENTIAL/PLATELET
BASOS ABS: 0.1 10*3/uL (ref 0–0.1)
Basophils Relative: 1 %
EOS PCT: 1 %
Eosinophils Absolute: 0.1 10*3/uL (ref 0–0.7)
HEMATOCRIT: 14.3 % — AB (ref 35.0–47.0)
HEMOGLOBIN: 4.8 g/dL — AB (ref 12.0–16.0)
LYMPHS PCT: 16 %
Lymphs Abs: 1.7 10*3/uL (ref 1.0–3.6)
MCH: 31.1 pg (ref 26.0–34.0)
MCHC: 33.5 g/dL (ref 32.0–36.0)
MCV: 92.7 fL (ref 80.0–100.0)
MONOS PCT: 13 %
Monocytes Absolute: 1.4 10*3/uL — ABNORMAL HIGH (ref 0.2–0.9)
Neutro Abs: 7.2 10*3/uL — ABNORMAL HIGH (ref 1.4–6.5)
Neutrophils Relative %: 69 %
Platelets: 271 10*3/uL (ref 150–440)
RBC: 1.54 MIL/uL — AB (ref 3.80–5.20)
RDW: 22.6 % — ABNORMAL HIGH (ref 11.5–14.5)
WBC: 10.5 10*3/uL (ref 3.6–11.0)

## 2016-06-22 LAB — HEMOGLOBIN: HEMOGLOBIN: 4.8 g/dL — AB (ref 12.0–16.0)

## 2016-06-22 LAB — URINALYSIS, COMPLETE (UACMP) WITH MICROSCOPIC
BACTERIA UA: NONE SEEN
Bilirubin Urine: NEGATIVE
GLUCOSE, UA: NEGATIVE mg/dL
Ketones, ur: NEGATIVE mg/dL
Leukocytes, UA: NEGATIVE
NITRITE: NEGATIVE
PROTEIN: NEGATIVE mg/dL
RBC / HPF: NONE SEEN RBC/hpf (ref 0–5)
SPECIFIC GRAVITY, URINE: 1.005 (ref 1.005–1.030)
WBC, UA: NONE SEEN WBC/hpf (ref 0–5)
pH: 6 (ref 5.0–8.0)

## 2016-06-22 LAB — PREPARE RBC (CROSSMATCH)

## 2016-06-22 LAB — SAMPLE TO BLOOD BANK

## 2016-06-22 MED ORDER — SODIUM CHLORIDE 0.9 % IV SOLN: 250.0000 mL | Freq: Once | INTRAVENOUS | Status: DC

## 2016-06-22 MED ORDER — FUROSEMIDE 10 MG/ML IJ SOLN
20.0000 mg | Freq: Once | INTRAMUSCULAR | Status: AC
  Administered 2016-06-22: 20 mg via INTRAVENOUS

## 2016-06-22 MED ORDER — EPOETIN ALFA 40000 UNIT/ML IJ SOLN
40000.0000 [IU] | Freq: Once | INTRAMUSCULAR | Status: AC
  Administered 2016-06-22: 40000 [IU] via SUBCUTANEOUS
  Filled 2016-06-22: qty 1

## 2016-06-22 MED ORDER — DIPHENHYDRAMINE HCL 25 MG PO CAPS
25.0000 mg | ORAL_CAPSULE | Freq: Once | ORAL | Status: AC
  Administered 2016-06-22: 25 mg via ORAL

## 2016-06-22 MED ORDER — HEPARIN SOD (PORK) LOCK FLUSH 100 UNIT/ML IV SOLN
INTRAVENOUS | Status: AC
  Filled 2016-06-22: qty 5

## 2016-06-22 MED ORDER — DIPHENHYDRAMINE HCL 25 MG PO CAPS
ORAL_CAPSULE | ORAL | Status: AC
  Administered 2016-06-22: 25 mg via ORAL
  Filled 2016-06-22: qty 1

## 2016-06-22 MED ORDER — SODIUM CHLORIDE 0.9% FLUSH: 10.0000 mL | INTRAVENOUS | Status: DC | PRN

## 2016-06-22 MED ORDER — HEPARIN SOD (PORK) LOCK FLUSH 100 UNIT/ML IV SOLN
500.0000 [IU] | Freq: Every day | INTRAVENOUS | Status: AC | PRN
  Administered 2016-06-22: 500 [IU]

## 2016-06-22 MED ORDER — HEPARIN SOD (PORK) LOCK FLUSH 100 UNIT/ML IV SOLN: 250.0000 [IU] | INTRAVENOUS | Status: DC | PRN

## 2016-06-22 MED ORDER — HEPARIN SOD (PORK) LOCK FLUSH 100 UNIT/ML IV SOLN: 500.0000 [IU] | Freq: Every day | INTRAVENOUS | Status: DC | PRN

## 2016-06-22 MED ORDER — SODIUM CHLORIDE FLUSH 0.9 % IV SOLN
INTRAVENOUS | Status: AC
  Filled 2016-06-22: qty 10

## 2016-06-22 MED ORDER — ACETAMINOPHEN 325 MG PO TABS
650.0000 mg | ORAL_TABLET | Freq: Once | ORAL | Status: AC
  Administered 2016-06-22: 650 mg via ORAL

## 2016-06-22 MED ORDER — SODIUM CHLORIDE 0.9% FLUSH: 3.0000 mL | INTRAVENOUS | Status: DC | PRN

## 2016-06-22 MED ORDER — ACETAMINOPHEN 325 MG PO TABS
ORAL_TABLET | ORAL | Status: AC
Start: 2016-06-22 — End: 2016-06-22
  Administered 2016-06-22: 650 mg via ORAL
  Filled 2016-06-22: qty 2

## 2016-06-22 MED ORDER — FUROSEMIDE 10 MG/ML IJ SOLN
INTRAMUSCULAR | Status: AC
  Administered 2016-06-22: 20 mg via INTRAVENOUS
  Filled 2016-06-22: qty 2

## 2016-06-22 NOTE — Progress Notes (Signed)
Patient reports having a "terrible weekend" due to an increase in SOBr and weakness.  Also had a temp of 99.4 last night with chills.  Dr. Rogue Bussing ordered additional labs (blood culture x2, CBC, UA/UC) and she is to get a chest xray PA & LAT.  Hemoglobin resulted at 4/8 today.  Renita Papa, RN has arranged her go to same day surgery for 2 units of blood transfusion today.

## 2016-06-22 NOTE — OR Nursing (Signed)
1818blood ttransfusion complete   Port flushed without difficulty

## 2016-06-22 NOTE — Telephone Encounter (Addendum)
1042 am - Call report from [facility] in cancer ctr lab. Read back process performed. Lonnie reports critical hgb - 4.8 /  hct 14.2.  Read back process performed with Dr. Rogue Bussing. V/o to transfuse 2 units. Unable to accommodate in cancer ctr today due to policy that will not allow cancer ctr to transfuse pts with hgb under 5. RN Spoke with Melissa in same day surgery. Able to accommodate pt's blood transfusion today.  MD states that he would like pt to have a CXR today first while blood is getting ready in blood bank.   Pt was made aware of the need for a chest xray.

## 2016-06-23 ENCOUNTER — Telehealth: Payer: Self-pay | Admitting: *Deleted

## 2016-06-23 LAB — TYPE AND SCREEN
ABO/RH(D): O POS
ANTIBODY SCREEN: NEGATIVE
Unit division: 0
Unit division: 0

## 2016-06-23 NOTE — Telephone Encounter (Signed)
Jane Short from [facility] reported one missed home visit from last week,due to snow.

## 2016-06-23 NOTE — Telephone Encounter (Signed)
fyi

## 2016-06-24 ENCOUNTER — Inpatient Hospital Stay: Payer: Medicare Other

## 2016-06-24 VITALS — BP 136/71 | HR 72

## 2016-06-24 DIAGNOSIS — N189 Chronic kidney disease, unspecified: Principal | ICD-10-CM

## 2016-06-24 DIAGNOSIS — I129 Hypertensive chronic kidney disease with stage 1 through stage 4 chronic kidney disease, or unspecified chronic kidney disease: Secondary | ICD-10-CM | POA: Diagnosis not present

## 2016-06-24 DIAGNOSIS — D631 Anemia in chronic kidney disease: Secondary | ICD-10-CM

## 2016-06-24 LAB — URINE CULTURE: Culture: 30000 — AB

## 2016-06-24 MED ORDER — CEPHALEXIN 500 MG PO CAPS: 500.0000 mg | ORAL_CAPSULE | Freq: Three times a day (TID) | ORAL | 0 refills | Status: AC

## 2016-06-24 MED ORDER — EPOETIN ALFA 40000 UNIT/ML IJ SOLN
40000.0000 [IU] | Freq: Once | INTRAMUSCULAR | Status: AC
  Administered 2016-06-24: 40000 [IU] via SUBCUTANEOUS
  Filled 2016-06-24: qty 1

## 2016-06-24 NOTE — Progress Notes (Signed)
Dr. Rogue Bussing reviewed urine cultures.  V/O to send Keflex 500mg  TID x7 days for E. Coli bacteria on urine culture.  RX escribes to [facility].

## 2016-06-26 ENCOUNTER — Inpatient Hospital Stay: Payer: Medicare Other

## 2016-06-26 VITALS — BP 124/66 | HR 69

## 2016-06-26 DIAGNOSIS — I129 Hypertensive chronic kidney disease with stage 1 through stage 4 chronic kidney disease, or unspecified chronic kidney disease: Secondary | ICD-10-CM | POA: Diagnosis not present

## 2016-06-26 DIAGNOSIS — N189 Chronic kidney disease, unspecified: Principal | ICD-10-CM

## 2016-06-26 DIAGNOSIS — D631 Anemia in chronic kidney disease: Secondary | ICD-10-CM

## 2016-06-26 DIAGNOSIS — N184 Chronic kidney disease, stage 4 (severe): Principal | ICD-10-CM

## 2016-06-26 DIAGNOSIS — D571 Sickle-cell disease without crisis: Secondary | ICD-10-CM

## 2016-06-26 LAB — HEMOGLOBIN: HEMOGLOBIN: 7.6 g/dL — AB (ref 12.0–16.0)

## 2016-06-26 LAB — HEMATOCRIT: HEMATOCRIT: 22.6 % — AB (ref 35.0–47.0)

## 2016-06-26 LAB — SAMPLE TO BLOOD BANK

## 2016-06-26 MED ORDER — EPOETIN ALFA 40000 UNIT/ML IJ SOLN
40000.0000 [IU] | Freq: Once | INTRAMUSCULAR | Status: AC
  Administered 2016-06-26: 40000 [IU] via SUBCUTANEOUS
  Filled 2016-06-26: qty 1

## 2016-06-27 LAB — CULTURE, BLOOD (ROUTINE X 2)
CULTURE: NO GROWTH
CULTURE: NO GROWTH

## 2016-06-29 ENCOUNTER — Inpatient Hospital Stay: Payer: Medicare Other

## 2016-06-29 VITALS — BP 148/65 | HR 72

## 2016-06-29 DIAGNOSIS — D631 Anemia in chronic kidney disease: Secondary | ICD-10-CM

## 2016-06-29 DIAGNOSIS — D571 Sickle-cell disease without crisis: Secondary | ICD-10-CM

## 2016-06-29 DIAGNOSIS — N189 Chronic kidney disease, unspecified: Principal | ICD-10-CM

## 2016-06-29 DIAGNOSIS — I129 Hypertensive chronic kidney disease with stage 1 through stage 4 chronic kidney disease, or unspecified chronic kidney disease: Secondary | ICD-10-CM | POA: Diagnosis not present

## 2016-06-29 DIAGNOSIS — N184 Chronic kidney disease, stage 4 (severe): Principal | ICD-10-CM

## 2016-06-29 LAB — HEMATOCRIT: HEMATOCRIT: 22.1 % — AB (ref 35.0–47.0)

## 2016-06-29 LAB — HEMOGLOBIN: HEMOGLOBIN: 7.5 g/dL — AB (ref 12.0–16.0)

## 2016-06-29 MED ORDER — EPOETIN ALFA 40000 UNIT/ML IJ SOLN
40000.0000 [IU] | Freq: Once | INTRAMUSCULAR | Status: AC
  Administered 2016-06-29: 40000 [IU] via SUBCUTANEOUS
  Filled 2016-06-29: qty 1

## 2016-06-30 LAB — SAMPLE TO BLOOD BANK

## 2016-07-01 ENCOUNTER — Inpatient Hospital Stay: Payer: Medicare Other

## 2016-07-01 VITALS — BP 137/66 | HR 76

## 2016-07-01 DIAGNOSIS — D631 Anemia in chronic kidney disease: Secondary | ICD-10-CM

## 2016-07-01 DIAGNOSIS — I129 Hypertensive chronic kidney disease with stage 1 through stage 4 chronic kidney disease, or unspecified chronic kidney disease: Secondary | ICD-10-CM | POA: Diagnosis not present

## 2016-07-01 DIAGNOSIS — N189 Chronic kidney disease, unspecified: Principal | ICD-10-CM

## 2016-07-01 MED ORDER — EPOETIN ALFA 40000 UNIT/ML IJ SOLN
40000.0000 [IU] | Freq: Once | INTRAMUSCULAR | Status: AC
  Administered 2016-07-01: 40000 [IU] via SUBCUTANEOUS
  Filled 2016-07-01: qty 1

## 2016-07-03 ENCOUNTER — Inpatient Hospital Stay: Payer: Medicare Other

## 2016-07-06 ENCOUNTER — Inpatient Hospital Stay: Payer: Medicare Other | Attending: Internal Medicine

## 2016-07-06 ENCOUNTER — Inpatient Hospital Stay: Payer: Medicare Other

## 2016-07-06 VITALS — BP 133/68 | HR 73

## 2016-07-06 DIAGNOSIS — D631 Anemia in chronic kidney disease: Secondary | ICD-10-CM

## 2016-07-06 DIAGNOSIS — D696 Thrombocytopenia, unspecified: Secondary | ICD-10-CM | POA: Diagnosis not present

## 2016-07-06 DIAGNOSIS — Z87891 Personal history of nicotine dependence: Secondary | ICD-10-CM | POA: Diagnosis not present

## 2016-07-06 DIAGNOSIS — N184 Chronic kidney disease, stage 4 (severe): Principal | ICD-10-CM

## 2016-07-06 DIAGNOSIS — M25552 Pain in left hip: Secondary | ICD-10-CM | POA: Diagnosis not present

## 2016-07-06 DIAGNOSIS — I509 Heart failure, unspecified: Secondary | ICD-10-CM | POA: Diagnosis not present

## 2016-07-06 DIAGNOSIS — L439 Lichen planus, unspecified: Secondary | ICD-10-CM | POA: Diagnosis not present

## 2016-07-06 DIAGNOSIS — Z803 Family history of malignant neoplasm of breast: Secondary | ICD-10-CM | POA: Insufficient documentation

## 2016-07-06 DIAGNOSIS — M109 Gout, unspecified: Secondary | ICD-10-CM | POA: Diagnosis not present

## 2016-07-06 DIAGNOSIS — I4891 Unspecified atrial fibrillation: Secondary | ICD-10-CM | POA: Diagnosis not present

## 2016-07-06 DIAGNOSIS — N189 Chronic kidney disease, unspecified: Secondary | ICD-10-CM

## 2016-07-06 DIAGNOSIS — I129 Hypertensive chronic kidney disease with stage 1 through stage 4 chronic kidney disease, or unspecified chronic kidney disease: Secondary | ICD-10-CM | POA: Insufficient documentation

## 2016-07-06 DIAGNOSIS — I10 Essential (primary) hypertension: Secondary | ICD-10-CM | POA: Diagnosis not present

## 2016-07-06 DIAGNOSIS — Z79899 Other long term (current) drug therapy: Secondary | ICD-10-CM | POA: Insufficient documentation

## 2016-07-06 DIAGNOSIS — K7689 Other specified diseases of liver: Secondary | ICD-10-CM | POA: Diagnosis not present

## 2016-07-06 DIAGNOSIS — K761 Chronic passive congestion of liver: Secondary | ICD-10-CM | POA: Diagnosis not present

## 2016-07-06 DIAGNOSIS — D571 Sickle-cell disease without crisis: Secondary | ICD-10-CM | POA: Insufficient documentation

## 2016-07-06 DIAGNOSIS — M818 Other osteoporosis without current pathological fracture: Secondary | ICD-10-CM | POA: Diagnosis not present

## 2016-07-06 DIAGNOSIS — J449 Chronic obstructive pulmonary disease, unspecified: Secondary | ICD-10-CM | POA: Diagnosis not present

## 2016-07-06 DIAGNOSIS — E559 Vitamin D deficiency, unspecified: Secondary | ICD-10-CM | POA: Insufficient documentation

## 2016-07-06 LAB — SAMPLE TO BLOOD BANK

## 2016-07-06 LAB — HEMOGLOBIN: Hemoglobin: 7.6 g/dL — ABNORMAL LOW (ref 12.0–16.0)

## 2016-07-06 LAB — HEMATOCRIT: HEMATOCRIT: 20.7 % — AB (ref 35.0–47.0)

## 2016-07-06 MED ORDER — EPOETIN ALFA 40000 UNIT/ML IJ SOLN
40000.0000 [IU] | Freq: Once | INTRAMUSCULAR | Status: AC
  Administered 2016-07-06: 40000 [IU] via SUBCUTANEOUS
  Filled 2016-07-06: qty 1

## 2016-07-08 ENCOUNTER — Inpatient Hospital Stay: Payer: Medicare Other

## 2016-07-08 ENCOUNTER — Telehealth: Payer: Self-pay | Admitting: *Deleted

## 2016-07-08 VITALS — BP 127/59 | HR 69

## 2016-07-08 DIAGNOSIS — I129 Hypertensive chronic kidney disease with stage 1 through stage 4 chronic kidney disease, or unspecified chronic kidney disease: Secondary | ICD-10-CM | POA: Diagnosis not present

## 2016-07-08 DIAGNOSIS — N189 Chronic kidney disease, unspecified: Principal | ICD-10-CM

## 2016-07-08 DIAGNOSIS — D631 Anemia in chronic kidney disease: Secondary | ICD-10-CM

## 2016-07-08 MED ORDER — EPOETIN ALFA 40000 UNIT/ML IJ SOLN
40000.0000 [IU] | Freq: Once | INTRAMUSCULAR | Status: AC
  Administered 2016-07-08: 40000 [IU] via SUBCUTANEOUS
  Filled 2016-07-08: qty 1

## 2016-07-08 NOTE — Telephone Encounter (Signed)
Raquel Sarna from [facility] requested a verbal order to have pt's physical therapy visit moved to next week for discharge, due to pt going out of town.  Contact Raquel Sarna [phone]

## 2016-07-08 NOTE — Telephone Encounter (Signed)
Please advise 

## 2016-07-10 ENCOUNTER — Inpatient Hospital Stay: Payer: Medicare Other

## 2016-07-10 NOTE — Telephone Encounter (Signed)
Verbal order may be given.

## 2016-07-10 NOTE — Telephone Encounter (Signed)
Notified Raquel Sarna of verbal order

## 2016-07-13 ENCOUNTER — Inpatient Hospital Stay: Payer: Medicare Other

## 2016-07-13 ENCOUNTER — Other Ambulatory Visit: Payer: Self-pay | Admitting: Internal Medicine

## 2016-07-13 ENCOUNTER — Other Ambulatory Visit: Payer: Self-pay | Admitting: *Deleted

## 2016-07-13 VITALS — BP 123/67 | HR 73

## 2016-07-13 DIAGNOSIS — D571 Sickle-cell disease without crisis: Secondary | ICD-10-CM

## 2016-07-13 DIAGNOSIS — I129 Hypertensive chronic kidney disease with stage 1 through stage 4 chronic kidney disease, or unspecified chronic kidney disease: Secondary | ICD-10-CM | POA: Diagnosis not present

## 2016-07-13 DIAGNOSIS — N184 Chronic kidney disease, stage 4 (severe): Principal | ICD-10-CM

## 2016-07-13 DIAGNOSIS — D631 Anemia in chronic kidney disease: Secondary | ICD-10-CM

## 2016-07-13 DIAGNOSIS — N189 Chronic kidney disease, unspecified: Principal | ICD-10-CM

## 2016-07-13 LAB — HEMOGLOBIN: Hemoglobin: 5.5 g/dL — ABNORMAL LOW (ref 12.0–16.0)

## 2016-07-13 LAB — SAMPLE TO BLOOD BANK

## 2016-07-13 LAB — HEMATOCRIT: HCT: 15.6 % — ABNORMAL LOW (ref 35.0–47.0)

## 2016-07-13 LAB — PREPARE RBC (CROSSMATCH)

## 2016-07-13 MED ORDER — EPOETIN ALFA 40000 UNIT/ML IJ SOLN
40000.0000 [IU] | Freq: Once | INTRAMUSCULAR | Status: AC
  Administered 2016-07-13: 40000 [IU] via SUBCUTANEOUS
  Filled 2016-07-13: qty 1

## 2016-07-13 NOTE — Progress Notes (Signed)
Hemoglobin 5.5 today.  Discussed with Dr. Rogue Bussing and he would like patient to be scheduled for blood transfusion.  Appt made for blood transfusion tomorrow and 9 am.  Patient aware.

## 2016-07-13 NOTE — Progress Notes (Signed)
BLOOD BANK ORDERS RELEASED. 2 UNITS OF BLOOD ON 07/14/16. BLOOD BANK MADE AWARE- SPOKE WITH DEE IN BLOOD BANK

## 2016-07-14 ENCOUNTER — Inpatient Hospital Stay: Payer: Medicare Other

## 2016-07-14 DIAGNOSIS — N184 Chronic kidney disease, stage 4 (severe): Principal | ICD-10-CM

## 2016-07-14 DIAGNOSIS — D631 Anemia in chronic kidney disease: Secondary | ICD-10-CM

## 2016-07-14 DIAGNOSIS — I129 Hypertensive chronic kidney disease with stage 1 through stage 4 chronic kidney disease, or unspecified chronic kidney disease: Secondary | ICD-10-CM | POA: Diagnosis not present

## 2016-07-14 MED ORDER — SODIUM CHLORIDE 0.9 % IV SOLN
250.0000 mL | Freq: Once | INTRAVENOUS | Status: AC
  Administered 2016-07-14: 250 mL via INTRAVENOUS
  Filled 2016-07-14: qty 250

## 2016-07-14 MED ORDER — ACETAMINOPHEN 325 MG PO TABS
650.0000 mg | ORAL_TABLET | Freq: Once | ORAL | Status: AC
  Administered 2016-07-14: 650 mg via ORAL
  Filled 2016-07-14: qty 2

## 2016-07-14 MED ORDER — HEPARIN SOD (PORK) LOCK FLUSH 100 UNIT/ML IV SOLN
500.0000 [IU] | Freq: Every day | INTRAVENOUS | Status: AC | PRN
  Administered 2016-07-14: 500 [IU]
  Filled 2016-07-14: qty 5

## 2016-07-14 MED ORDER — FUROSEMIDE 10 MG/ML IJ SOLN
20.0000 mg | Freq: Once | INTRAMUSCULAR | Status: AC
  Administered 2016-07-14: 20 mg via INTRAVENOUS
  Filled 2016-07-14: qty 2

## 2016-07-14 MED ORDER — SODIUM CHLORIDE 0.9% FLUSH
10.0000 mL | INTRAVENOUS | Status: AC | PRN
  Administered 2016-07-14: 10 mL
  Filled 2016-07-14: qty 10

## 2016-07-14 MED ORDER — DIPHENHYDRAMINE HCL 25 MG PO CAPS
25.0000 mg | ORAL_CAPSULE | Freq: Once | ORAL | Status: AC
  Administered 2016-07-14: 25 mg via ORAL
  Filled 2016-07-14: qty 1

## 2016-07-15 ENCOUNTER — Inpatient Hospital Stay: Payer: Medicare Other

## 2016-07-15 VITALS — BP 131/69 | HR 60

## 2016-07-15 DIAGNOSIS — N189 Chronic kidney disease, unspecified: Principal | ICD-10-CM

## 2016-07-15 DIAGNOSIS — I129 Hypertensive chronic kidney disease with stage 1 through stage 4 chronic kidney disease, or unspecified chronic kidney disease: Secondary | ICD-10-CM | POA: Diagnosis not present

## 2016-07-15 DIAGNOSIS — D631 Anemia in chronic kidney disease: Secondary | ICD-10-CM

## 2016-07-15 LAB — TYPE AND SCREEN
ABO/RH(D): O POS
Antibody Screen: NEGATIVE
Unit division: 0
Unit division: 0
Unit tag comment: NEGATIVE
Unit tag comment: NEGATIVE

## 2016-07-15 MED ORDER — EPOETIN ALFA 40000 UNIT/ML IJ SOLN
40000.0000 [IU] | Freq: Once | INTRAMUSCULAR | Status: AC
  Administered 2016-07-15: 40000 [IU] via SUBCUTANEOUS
  Filled 2016-07-15: qty 1

## 2016-07-17 ENCOUNTER — Inpatient Hospital Stay: Payer: Medicare Other

## 2016-07-17 VITALS — BP 158/73 | HR 64

## 2016-07-17 DIAGNOSIS — D631 Anemia in chronic kidney disease: Secondary | ICD-10-CM

## 2016-07-17 DIAGNOSIS — N189 Chronic kidney disease, unspecified: Principal | ICD-10-CM

## 2016-07-17 DIAGNOSIS — I129 Hypertensive chronic kidney disease with stage 1 through stage 4 chronic kidney disease, or unspecified chronic kidney disease: Secondary | ICD-10-CM | POA: Diagnosis not present

## 2016-07-17 MED ORDER — EPOETIN ALFA 40000 UNIT/ML IJ SOLN
40000.0000 [IU] | Freq: Once | INTRAMUSCULAR | Status: AC
  Administered 2016-07-17: 40000 [IU] via SUBCUTANEOUS
  Filled 2016-07-17: qty 1

## 2016-07-20 ENCOUNTER — Inpatient Hospital Stay: Payer: Medicare Other

## 2016-07-20 ENCOUNTER — Inpatient Hospital Stay (HOSPITAL_BASED_OUTPATIENT_CLINIC_OR_DEPARTMENT_OTHER): Payer: Medicare Other | Admitting: Internal Medicine

## 2016-07-20 VITALS — BP 145/56 | HR 60 | Temp 98.1°F | Wt 191.2 lb

## 2016-07-20 DIAGNOSIS — M109 Gout, unspecified: Secondary | ICD-10-CM

## 2016-07-20 DIAGNOSIS — Z803 Family history of malignant neoplasm of breast: Secondary | ICD-10-CM

## 2016-07-20 DIAGNOSIS — Z87891 Personal history of nicotine dependence: Secondary | ICD-10-CM

## 2016-07-20 DIAGNOSIS — N184 Chronic kidney disease, stage 4 (severe): Secondary | ICD-10-CM | POA: Diagnosis not present

## 2016-07-20 DIAGNOSIS — Z79899 Other long term (current) drug therapy: Secondary | ICD-10-CM

## 2016-07-20 DIAGNOSIS — J449 Chronic obstructive pulmonary disease, unspecified: Secondary | ICD-10-CM

## 2016-07-20 DIAGNOSIS — D571 Sickle-cell disease without crisis: Secondary | ICD-10-CM | POA: Diagnosis not present

## 2016-07-20 DIAGNOSIS — L439 Lichen planus, unspecified: Secondary | ICD-10-CM

## 2016-07-20 DIAGNOSIS — M818 Other osteoporosis without current pathological fracture: Secondary | ICD-10-CM

## 2016-07-20 DIAGNOSIS — D631 Anemia in chronic kidney disease: Secondary | ICD-10-CM

## 2016-07-20 DIAGNOSIS — I129 Hypertensive chronic kidney disease with stage 1 through stage 4 chronic kidney disease, or unspecified chronic kidney disease: Secondary | ICD-10-CM

## 2016-07-20 DIAGNOSIS — M25552 Pain in left hip: Secondary | ICD-10-CM

## 2016-07-20 DIAGNOSIS — N189 Chronic kidney disease, unspecified: Principal | ICD-10-CM

## 2016-07-20 DIAGNOSIS — I4891 Unspecified atrial fibrillation: Secondary | ICD-10-CM

## 2016-07-20 DIAGNOSIS — I10 Essential (primary) hypertension: Secondary | ICD-10-CM

## 2016-07-20 DIAGNOSIS — K761 Chronic passive congestion of liver: Secondary | ICD-10-CM | POA: Diagnosis not present

## 2016-07-20 DIAGNOSIS — D696 Thrombocytopenia, unspecified: Secondary | ICD-10-CM

## 2016-07-20 DIAGNOSIS — I509 Heart failure, unspecified: Secondary | ICD-10-CM

## 2016-07-20 DIAGNOSIS — K7689 Other specified diseases of liver: Secondary | ICD-10-CM

## 2016-07-20 DIAGNOSIS — E559 Vitamin D deficiency, unspecified: Secondary | ICD-10-CM

## 2016-07-20 LAB — CBC WITH DIFFERENTIAL/PLATELET
Basophils Absolute: 0 10*3/uL (ref 0–0.1)
Basophils Relative: 0 %
EOS ABS: 0.1 10*3/uL (ref 0–0.7)
Eosinophils Relative: 1 %
HCT: 21.3 % — ABNORMAL LOW (ref 35.0–47.0)
Hemoglobin: 7.8 g/dL — ABNORMAL LOW (ref 12.0–16.0)
LYMPHS ABS: 1.2 10*3/uL (ref 1.0–3.6)
LYMPHS PCT: 13 %
MCH: 34.7 pg — ABNORMAL HIGH (ref 26.0–34.0)
MCHC: 36.7 g/dL — ABNORMAL HIGH (ref 32.0–36.0)
MCV: 94.5 fL (ref 80.0–100.0)
MONO ABS: 1.2 10*3/uL — AB (ref 0.2–0.9)
Monocytes Relative: 13 %
NEUTROS ABS: 6.6 10*3/uL — AB (ref 1.4–6.5)
NEUTROS PCT: 73 %
NRBC: 14 /100{WBCs} — AB
PLATELETS: 178 10*3/uL (ref 150–440)
RBC: 2.26 MIL/uL — ABNORMAL LOW (ref 3.80–5.20)
RDW: 22.3 % — AB (ref 11.5–14.5)
WBC: 9.1 10*3/uL (ref 4.0–10.5)

## 2016-07-20 LAB — COMPREHENSIVE METABOLIC PANEL
ALT: 20 U/L (ref 14–54)
ANION GAP: 9 (ref 5–15)
AST: 38 U/L (ref 15–41)
Albumin: 3.5 g/dL (ref 3.5–5.0)
Alkaline Phosphatase: 187 U/L — ABNORMAL HIGH (ref 38–126)
BUN: 41 mg/dL — AB (ref 6–20)
CHLORIDE: 106 mmol/L (ref 101–111)
CO2: 22 mmol/L (ref 22–32)
Calcium: 9.3 mg/dL (ref 8.9–10.3)
Creatinine, Ser: 2.41 mg/dL — ABNORMAL HIGH (ref 0.44–1.00)
GFR calc Af Amer: 23 mL/min — ABNORMAL LOW (ref >60)
GFR, EST NON AFRICAN AMERICAN: 20 mL/min — AB (ref >60)
Glucose, Bld: 104 mg/dL — ABNORMAL HIGH (ref 65–99)
POTASSIUM: 4.8 mmol/L (ref 3.5–5.1)
Sodium: 137 mmol/L (ref 135–145)
Total Bilirubin: 5.7 mg/dL — ABNORMAL HIGH (ref 0.3–1.2)
Total Protein: 6.7 g/dL (ref 6.5–8.1)

## 2016-07-20 LAB — SAMPLE TO BLOOD BANK

## 2016-07-20 MED ORDER — EPOETIN ALFA 40000 UNIT/ML IJ SOLN
40000.0000 [IU] | Freq: Once | INTRAMUSCULAR | Status: AC
  Administered 2016-07-20: 40000 [IU] via SUBCUTANEOUS
  Filled 2016-07-20: qty 1

## 2016-07-20 NOTE — Progress Notes (Signed)
Patient here today for follow up.  Patient states no new concerns today  

## 2016-07-20 NOTE — Progress Notes (Signed)
[facility] OFFICE PROGRESS NOTE  Patient Care Team: Leone Haven, MD as PCP - General (Family Medicine) Seeplaputhur Robinette Haines, MD (General Surgery) Rubbie Battiest, NP as Nurse Practitioner (Gerontology) Flora Lipps, MD as Consulting Physician (Pulmonary Disease) Yolonda Kida, MD as Consulting Physician (Cardiology)   SUMMARY OF ONCOLOGIC HISTORY:  # Severe Anemia- multifactorial [as per Pt baseline 6-6.5] SCD/CKD- on procrit 40K/IV Iron; May 2017- Procrit 40K W-F; July 2017-Start IV Ferrahem q2W; SEP 2017- M-W-F    # SICKLE CELL ANEMIA/ Congestive hepatopathy [Dec 2016- Bili 98s s/p liver bx; [facility]];   # Acute renal failure/ CKD [Dr.Kolluru]/ CHF [Dr.]  # Jan 2017 ? Endocarditis s/p IV Abx/ A. Fib [not on anti-coag]   INTERVAL HISTORY:  66 year old female patient with above history of sickle cell anemia chronic kidney disease/CHF- is here for follow-up- anemia.  Patient shortness of breath is improved. She received blood transfusion last week. Her left hip pain is improved. However she does take Motrin or Aleve once or twice a week [against the recommendations from the nephrologist.].   Denies any increasing swelling in the legs. Denies any nausea vomiting. No fever no chills. Patient is not on dialysis yet. Patient is moving to [place]. Patient has established care in [place] with a hematologist [Dr.McNeal].   REVIEW OF SYSTEMS:  A complete 10 point review of system is done which is negative except mentioned above/history of present illness.   PAST MEDICAL HISTORY :  Past Medical History:  Diagnosis Date  . Anemia   . 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 Cheree Ditto at [facility]  . CHF (congestive heart failure) ([place])   . CKD (chronic kidney disease), stage V ([place])   . Congestive heart failure (CHF) ([place])   . Congestive heart failure (CHF) ([place]) 06/2015  . COPD (chronic obstructive pulmonary disease) (H[facility])    symptoms Dr. Patricia Pesa   . Gout   .  Hypertension   . Lichen planus   . Osteoporosis 2011   osteopenia  . SCA-1 (spinocerebellar ataxia type 1) ([place])   . Sickle cell anemia (H[facility])    sickle cell disease  . Sickle cell anemia (H[facility])   . Thrombocytopenia ([place])   . Vitamin D deficiency     PAST SURGICAL HISTORY :   Past Surgical History:  Procedure Laterality Date  . AV FISTULA PLACEMENT Left 02/27/2016   Procedure: INSERTION OF ARTERIOVENOUS (AV) GORE-TEX GRAFT ARM ( FOREARM LOOP );  Surgeon: Algernon Huxley, MD;  Location: [facility] ORS;  Service: Vascular;  Laterality: Left;  . CHOLECYSTECTOMY    . COLONOSCOPY  2014   [facility]  . EYE SURGERY     cataract bilateral  . GALLBLADDER SURGERY    . PERIPHERAL VASCULAR CATHETERIZATION Left 06/14/2015   Procedure: Dialysis/Perma Catheter Insertion;  Surgeon: Katha Cabal, MD;  Location: [facility];  Service: Cardiovascular;  Laterality: Left;  . PERIPHERAL VASCULAR CATHETERIZATION N/A 01/23/2016   Procedure: Glori Luis Cath Insertion;  Surgeon: Algernon Huxley, MD;  Location: [facility];  Service: Cardiovascular;  Laterality: N/A;  . PERIPHERAL VASCULAR CATHETERIZATION Left 03/30/2016   Procedure: A/V Shuntogram/Fistulagram;  Surgeon: Algernon Huxley, MD;  Location: [facility];  Service: Cardiovascular;  Laterality: Left;  . TUBAL LIGATION      FAMILY HISTORY :   Family History  Problem Relation Age of Onset  . Heart disease Father   . Diabetes Father   . Diabetes Sister   .  Cancer Mother     breast cancer  . Cancer Maternal Aunt     Breast cancer    SOCIAL HISTORY:   Social History  Substance Use Topics  . Smoking status: Former Smoker    Packs/day: 0.25    Years: 30.00    Types: Cigarettes    Quit date: 04/29/2015  . Smokeless tobacco: Never Used     Comment: quit 1 month ago  . Alcohol use No    ALLERGIES:  is allergic to ramipril.  MEDICATIONS:  Current Outpatient Prescriptions  Medication Sig Dispense Refill  . allopurinol (ZYLOPRIM) 100 MG  tablet Take 1 tablet (100 mg total) by mouth daily. 30 tablet 1  . amiodarone (PACERONE) 100 MG tablet Take 100 mg by mouth daily.    . calcitRIOL (ROCALTROL) 0.25 MCG capsule Take 0.25 mcg by mouth daily.    Marland Kitchen epoetin alfa (EPOGEN,PROCRIT) 28413 UNIT/ML injection Inject into the skin 3 (three) times a week.    . fluticasone furoate-vilanterol (BREO ELLIPTA) 200-25 MCG/INH AEPB Inhale 1 puff into the lungs daily. 60 each 5  . folic acid (FOLVITE) 1 MG tablet Take 1 mg by mouth daily.    . furosemide (LASIX) 80 MG tablet Take 80 mg by mouth 2 (two) times daily.    Marland Kitchen ibuprofen (ADVIL,MOTRIN) 400 MG tablet Take 1 tablet (400 mg total) by mouth daily as needed for moderate pain. 15 tablet 0  . Nutritional Supplements (FEEDING SUPPLEMENT, NEPRO CARB STEADY,) LIQD Take 237 mLs by mouth 2 (two) times daily between meals.  0  . OXYGEN Inhale into the lungs.    . sodium bicarbonate 650 MG tablet Take 650 mg by mouth 2 (two) times daily.      No current facility-administered medications for this visit.    Facility-Administered Medications Ordered in Other Visits  Medication Dose Route Frequency Provider Last Rate Last Dose  . 0.9 %  sodium chloride infusion  250 mL Intravenous Once Cammie Sickle, MD      . acetaminophen (TYLENOL) tablet 650 mg  650 mg Oral Once Cammie Sickle, MD      . acetaminophen (TYLENOL) tablet 650 mg  650 mg Oral Once Cammie Sickle, MD      . diphenhydrAMINE (BENADRYL) capsule 25 mg  25 mg Oral Once Cammie Sickle, MD      . diphenhydrAMINE (BENADRYL) capsule 25 mg  25 mg Oral Once Cammie Sickle, MD      . furosemide (LASIX) injection 20 mg  20 mg Intravenous Once Cammie Sickle, MD      . furosemide (LASIX) injection 20 mg  20 mg Intravenous Once Cammie Sickle, MD      . heparin lock flush 100 unit/mL  250 Units Intracatheter PRN Leia Alf, MD      . heparin lock flush 100 unit/mL  500 Units Intracatheter Daily PRN Cammie Sickle, MD      . heparin lock flush 100 unit/mL  250 Units Intracatheter PRN Cammie Sickle, MD      . heparin lock flush 100 unit/mL  500 Units Intracatheter Daily PRN Cammie Sickle, MD      . heparin lock flush 100 unit/mL  250 Units Intracatheter PRN Cammie Sickle, MD      . sodium chloride flush (NS) 0.9 % injection 10 mL  10 mL Intracatheter PRN Cammie Sickle, MD      . sodium chloride flush (NS) 0.9 % injection  10 mL  10 mL Intracatheter PRN Cammie Sickle, MD      . sodium chloride flush (NS) 0.9 % injection 3 mL  3 mL Intracatheter PRN Cammie Sickle, MD      . sodium chloride flush (NS) 0.9 % injection 3 mL  3 mL Intracatheter PRN Cammie Sickle, MD        PHYSICAL EXAMINATION:   BP (!) 145/56 (BP Location: Right Arm, Patient Position: Sitting)   Pulse 60   Temp 98.1 F (36.7 C) (Tympanic)   Wt 191 lb 4 oz (86.8 kg)   BMI 31.83 kg/m   Filed Weights   07/20/16 0917  Weight: 191 lb 4 oz (86.8 kg)    GENERAL: Well-nourished well-developed; no distress and comfortable.  With her husband.She is walking by herself.   EYES: positive for pallor/positive for icterus. OROPHARYNX: no thrush or ulceration; good dentition  NECK: supple, no masses felt LYMPH:  no palpable lymphadenopathy in the cervical, axillary or inguinal regions LUNGS: decreased breath sounds to auscultation and  No wheeze or crackles HEART/CVS: regular rate & rhythm and no murmurs; No lower extremity edema ABDOMEN:abdomen soft, non-tender and normal bowel sounds Musculoskeletal:no cyanosis of digits and no clubbing; PSYCH: alert & oriented x 3 with fluent speech NEURO: no focal motor/sensory deficits SKIN: lichen planus lesions on the Legs bil [>15 years per pt]  LABORATORY DATA:  I have reviewed the data as listed    Component Value Date/Time   NA 137 07/20/2016 0853   NA 138 06/20/2012 1400   K 4.8 07/20/2016 0853   K 4.3 06/20/2012 1400   CL 106  07/20/2016 0853   CL 105 06/20/2012 1400   CO2 22 07/20/2016 0853   CO2 24 06/20/2012 1400   GLUCOSE 104 (H) 07/20/2016 0853   GLUCOSE 102 (H) 06/20/2012 1400   BUN 41 (H) 07/20/2016 0853   BUN 13 06/20/2012 1400   CREATININE 2.41 (H) 07/20/2016 0853   CREATININE 1.36 (H) 03/22/2013 1522   CALCIUM 9.3 07/20/2016 0853   CALCIUM 8.4 (L) 12/23/2015 1039   PROT 6.7 07/20/2016 0853   PROT 7.1 03/22/2013 1522   ALBUMIN 3.5 07/20/2016 0853   ALBUMIN 3.7 03/22/2013 1522   AST 38 07/20/2016 0853   AST 48 (H) 03/22/2013 1522   ALT 20 07/20/2016 0853   ALT 25 03/22/2013 1522   ALKPHOS 187 (H) 07/20/2016 0853   ALKPHOS 139 (H) 03/22/2013 1522   BILITOT 5.7 (H) 07/20/2016 0853   BILITOT 4.0 (H) 03/22/2013 1522   GFRNONAA 20 (L) 07/20/2016 0853   GFRNONAA 42 (L) 03/22/2013 1522   GFRAA 23 (L) 07/20/2016 0853   GFRAA 48 (L) 03/22/2013 1522    No results found for: SPEP, UPEP  Lab Results  Component Value Date   WBC 9.1 07/20/2016   NEUTROABS 6.6 (H) 07/20/2016   HGB 7.8 (L) 07/20/2016   HCT 21.3 (L) 07/20/2016   MCV 94.5 07/20/2016   PLT 178 07/20/2016      Chemistry      Component Value Date/Time   NA 137 07/20/2016 0853   NA 138 06/20/2012 1400   K 4.8 07/20/2016 0853   K 4.3 06/20/2012 1400   CL 106 07/20/2016 0853   CL 105 06/20/2012 1400   CO2 22 07/20/2016 0853   CO2 24 06/20/2012 1400   BUN 41 (H) 07/20/2016 0853   BUN 13 06/20/2012 1400   CREATININE 2.41 (H) 07/20/2016 0853   CREATININE 1.36 (H) 03/22/2013 1522  GLU 89 02/20/2010      Component Value Date/Time   CALCIUM 9.3 07/20/2016 0853   CALCIUM 8.4 (L) 12/23/2015 1039   ALKPHOS 187 (H) 07/20/2016 0853   ALKPHOS 139 (H) 03/22/2013 1522   AST 38 07/20/2016 0853   AST 48 (H) 03/22/2013 1522   ALT 20 07/20/2016 0853   ALT 25 03/22/2013 1522   BILITOT 5.7 (H) 07/20/2016 0853   BILITOT 4.0 (H) 03/22/2013 1522         ASSESSMENT & PLAN:   Anemia of chronic renal failure, stage 4 (severe) ([place]) #  SEVERE ANEMIA-Multifactorial chronic kidney disease/iron deficiency/sickle cell disease. Brisk bone marrow response. Today hemoglobin is 7.8/baseline 6. Continue Procrit 40,000 units Monday Wednesday Friday.; IV ferrahem as needed. Patient has been needing PRBC transfusion once every other week or so.  # Sickle cell disease- confirmed on hemoglobin electrophoresis. On folic acid once a day. Defer to her new hematologist in [place] regarding starting Hydrea  # Left hip pain question osteonecrosis of hip versus others.  On Aleeve/Motrin once or twice a week; she is aware of issues with severe CKD. Reluctant with narcotics.   #  Chronic kidney disease/ acute renal failure- today creatinine is 3.5 [planning HD/fistula]- not on HD yet.   # Jaundice- secondary to congestive hepatopathy. Total bili is 5.6.  # COPD/OSA/CHF [Dr.Kasa] on Home O2 -2L [place] prn  CPAP.   # no more follow ups here/ plan to move to [place]; [place] in 2 days from now.     Cammie Sickle, MD 07/20/2016 4:47 PM

## 2016-07-20 NOTE — Assessment & Plan Note (Addendum)
#   SEVERE ANEMIA-Multifactorial chronic kidney disease/iron deficiency/sickle cell disease. Brisk bone marrow response. Today hemoglobin is 7.8/baseline 6. Continue Procrit 40,000 units Monday Wednesday Friday.; IV ferrahem as needed. Patient has been needing PRBC transfusion once every other week or so.  # Sickle cell disease- confirmed on hemoglobin electrophoresis. On folic acid once a day. Defer to her new hematologist in [place] regarding starting Hydrea  # Left hip pain question osteonecrosis of hip versus others.  On Aleeve/Motrin once or twice a week; she is aware of issues with severe CKD. Reluctant with narcotics.   #  Chronic kidney disease/ acute renal failure- today creatinine is 3.5 [planning HD/fistula]- not on HD yet.   # Jaundice- secondary to congestive hepatopathy. Total bili is 5.6.  # COPD/OSA/CHF [Dr.Kasa] on Home O2 -2L [place] prn  CPAP.   # no more follow ups here/ plan to move to [place]; [place] in 2 days from now.

## 2016-07-28 ENCOUNTER — Telehealth: Payer: Self-pay | Admitting: Family Medicine

## 2016-07-28 NOTE — Telephone Encounter (Signed)
FYI - Pt has moved back to [place].

## 2016-07-28 NOTE — Telephone Encounter (Signed)
Noted  

## 2016-07-28 NOTE — Telephone Encounter (Signed)
fyi

## 2016-07-31 ENCOUNTER — Ambulatory Visit: Payer: Medicare Other | Admitting: Family Medicine

## 2016-11-07 IMAGING — CR DG CHEST 2V
2 series · 2 of 2 positions shown · non-contrast
Comparison: 07/11/2012

CLINICAL DATA: Abdominal swelling intermittently over the past 2
months. Productive cough with increasing shortness of breath over
the past 3 weeks. Sickle cell disease.

EXAM:
CHEST  2 VIEW

[chest pa]
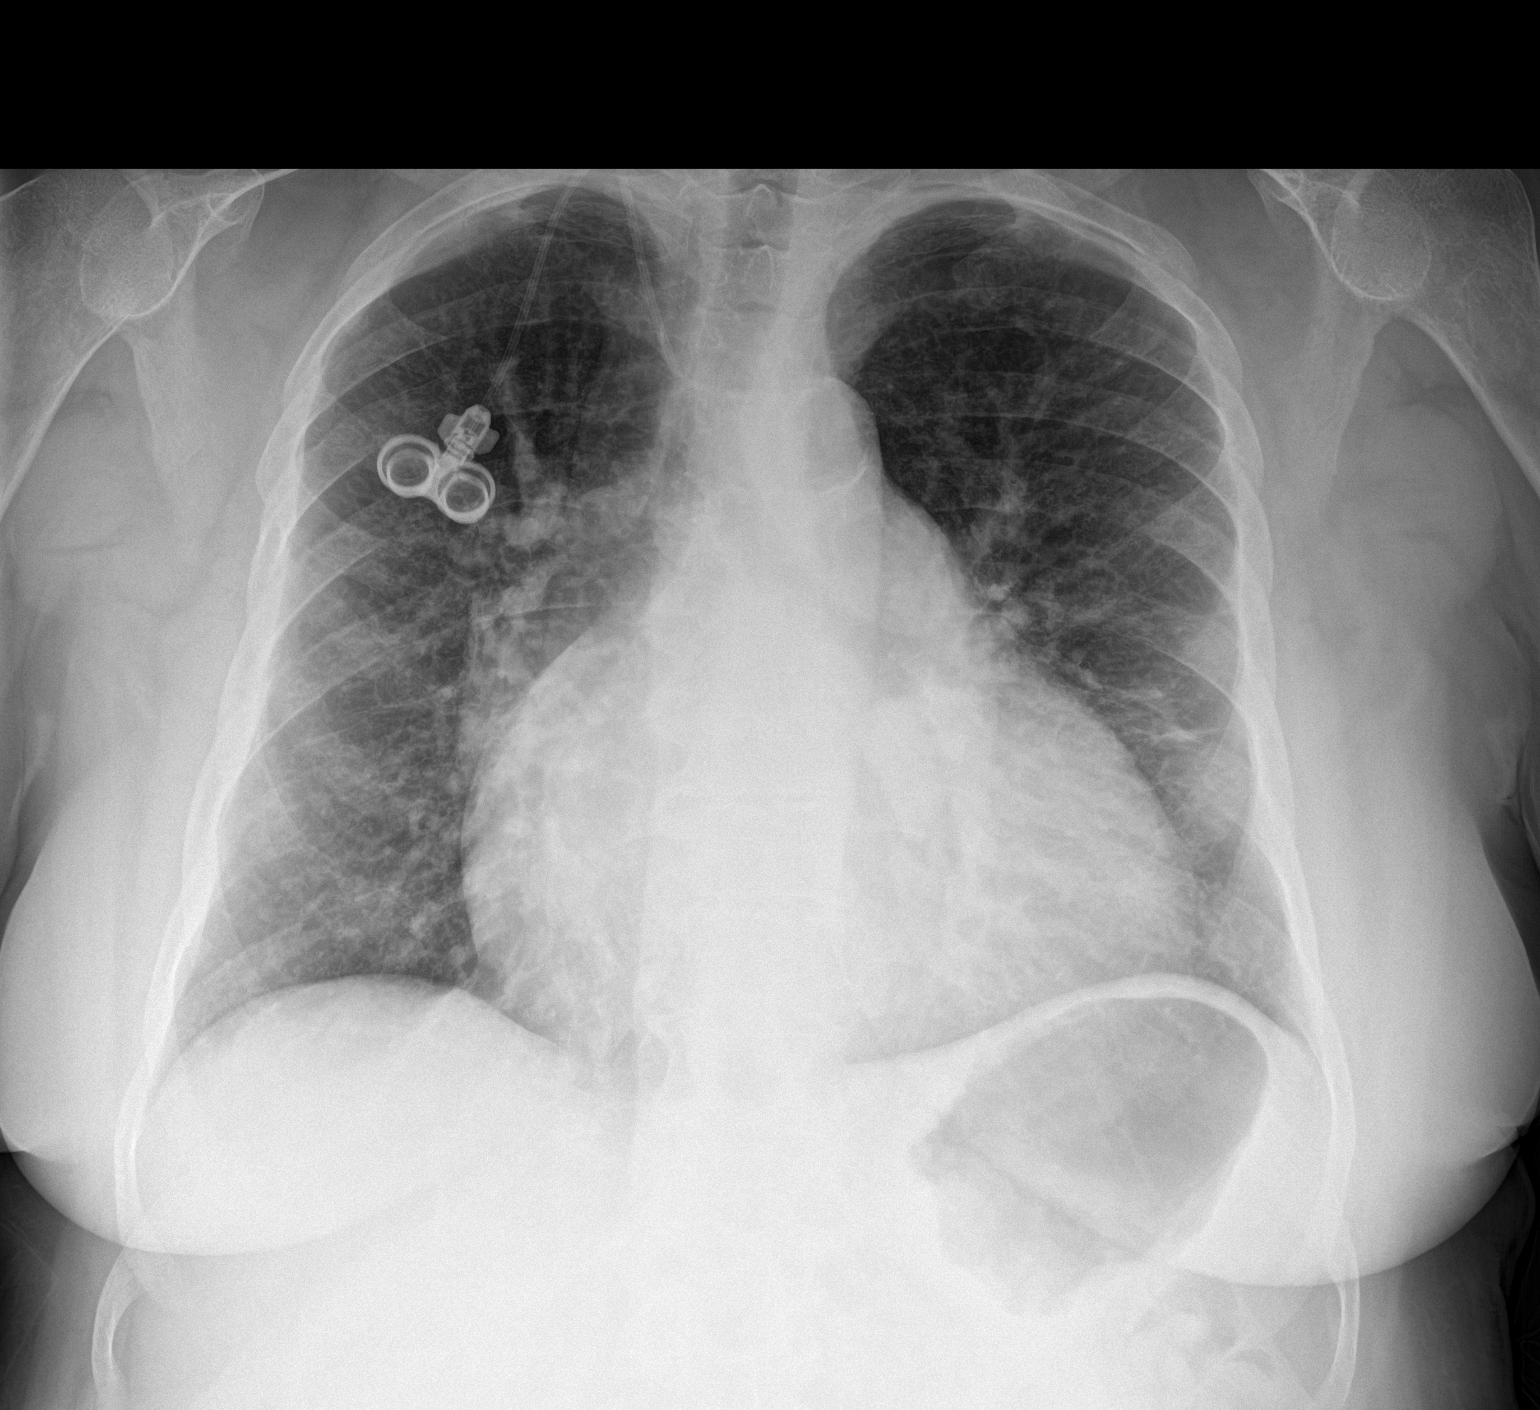

[chest lat]
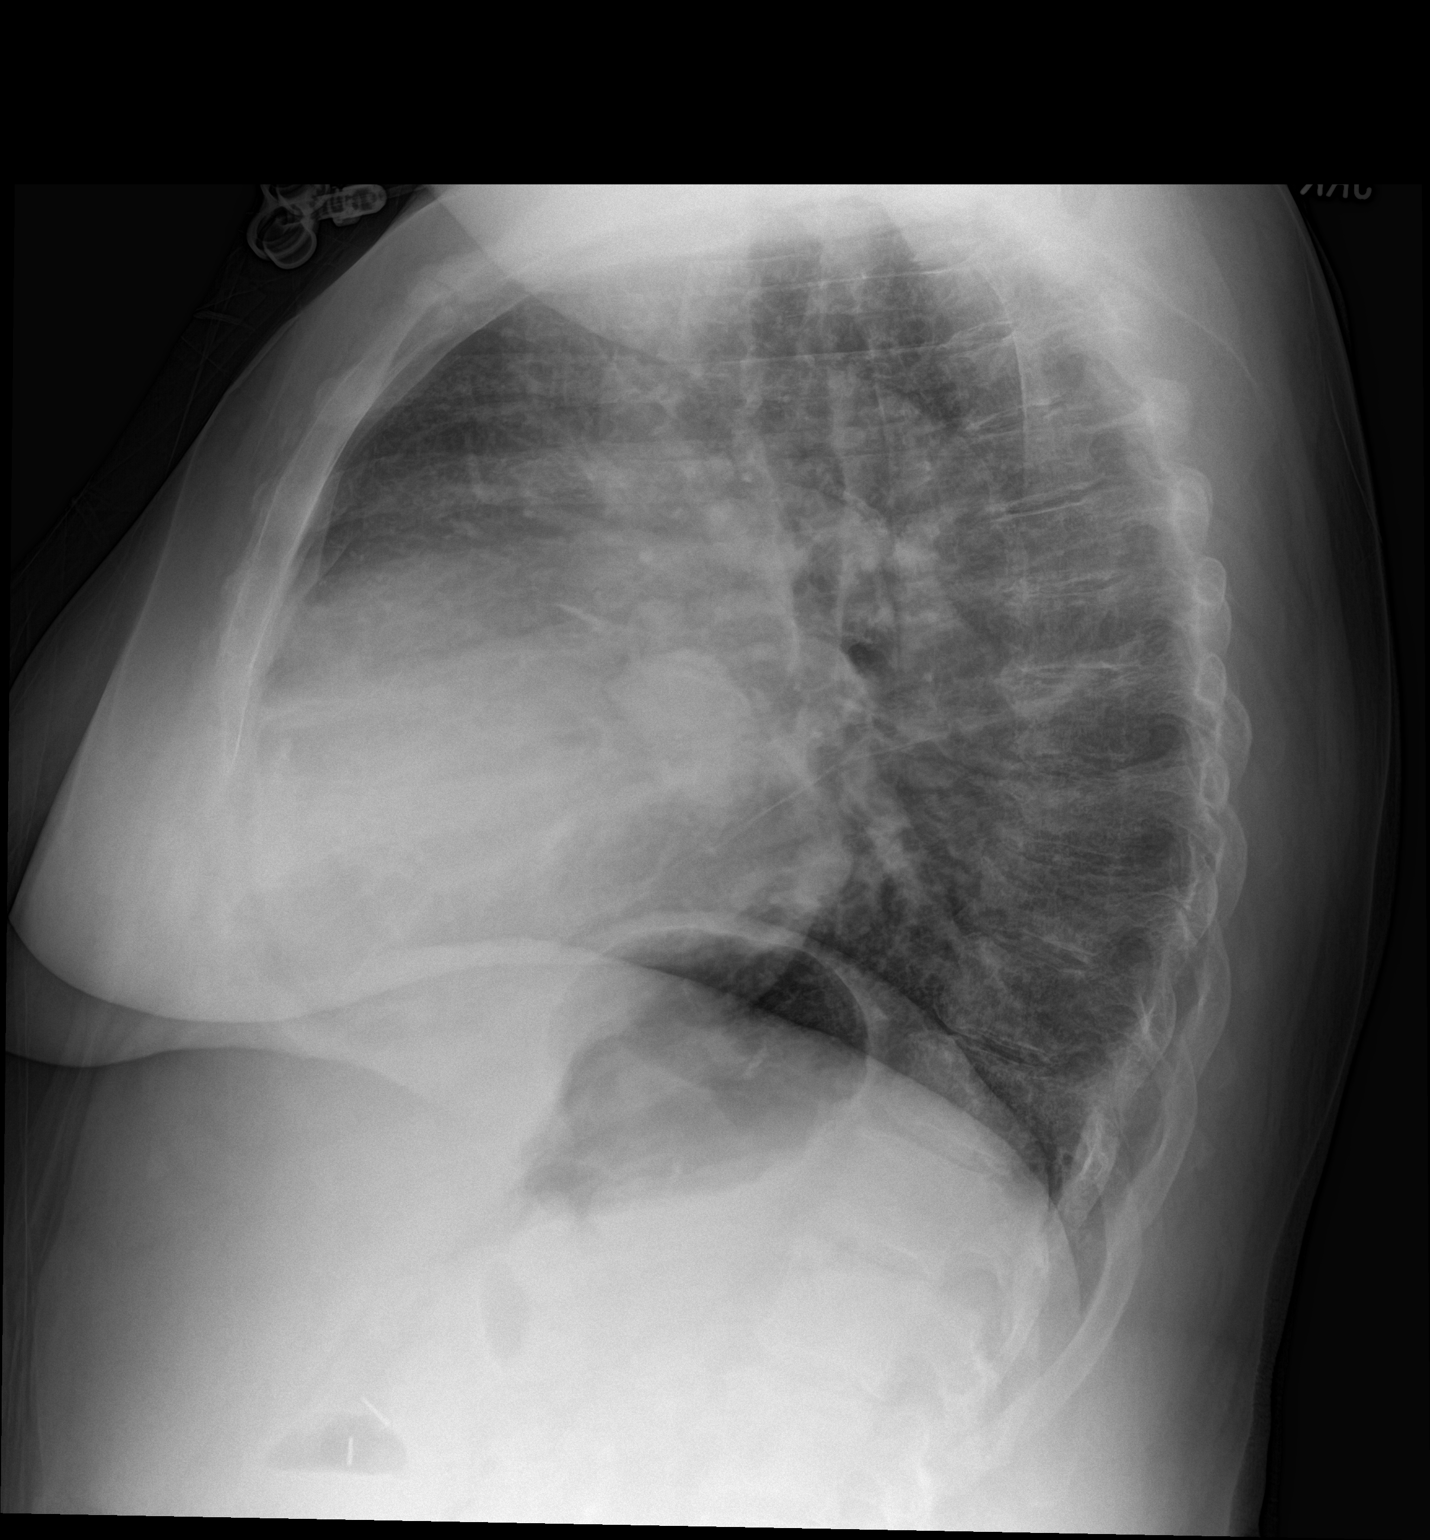

[2 of 2 positions shown; findings below may reference images not displayed]

FINDINGS: A right IJ central venous catheter is present with small loop over
the right neck base. Distal part catheter turns towards the left
into the left brachiocephalic vein with tip over the region of the
brachiocephalic vein.

Lungs are adequately inflated with mild prominence of the perihilar
markings likely mild vascular congestion. Possible nodular density
over the lateral left mid to lower lung. There is moderate
cardiomegaly without significant change. Mild prominence of the main
pulmonary artery segment unchanged. Calcified left atrium mass
slightly larger as patient has a history of left atrial myxoma.
Remainder of the exam is unchanged.
IMPRESSION: Moderate cardiomegaly with evidence of mild vascular congestion.

Calcified mass over the left atrium slightly larger compatible with
known left atrial myxoma. Evidence of

Possible pulmonary nodule over the lateral left mid to lower lung.
Recommend chest CT for further evaluation.

Stable prominence of the main pulmonary artery segment likely
indicating pulmonary arterial hypertension.

Right IJ suggest catheter looped over the right neck base with tip
over the left brachiocephalic vein.

These results were called by telephone at the time of interpretation
on 12/30/2014 at [DATE] to Dr. BENRABAH, who verbally acknowledged
these results.

## 2017-04-08 ENCOUNTER — Telehealth: Payer: Self-pay | Admitting: Internal Medicine

## 2017-04-08 NOTE — Telephone Encounter (Signed)
Patient moved to [facility]   Deleting recall

## 2017-04-13 IMAGING — CR DG CHEST 1V PORT
1 series · 1 of 1 positions shown · non-contrast
Comparison: Chest radiograph performed 12/30/2014, and CT of the
chest performed 01/10/2015

CLINICAL DATA: Acute onset of nausea, vomiting and generalized
chest pain. Initial encounter.

EXAM:
PORTABLE CHEST 1 VIEW

[ap]
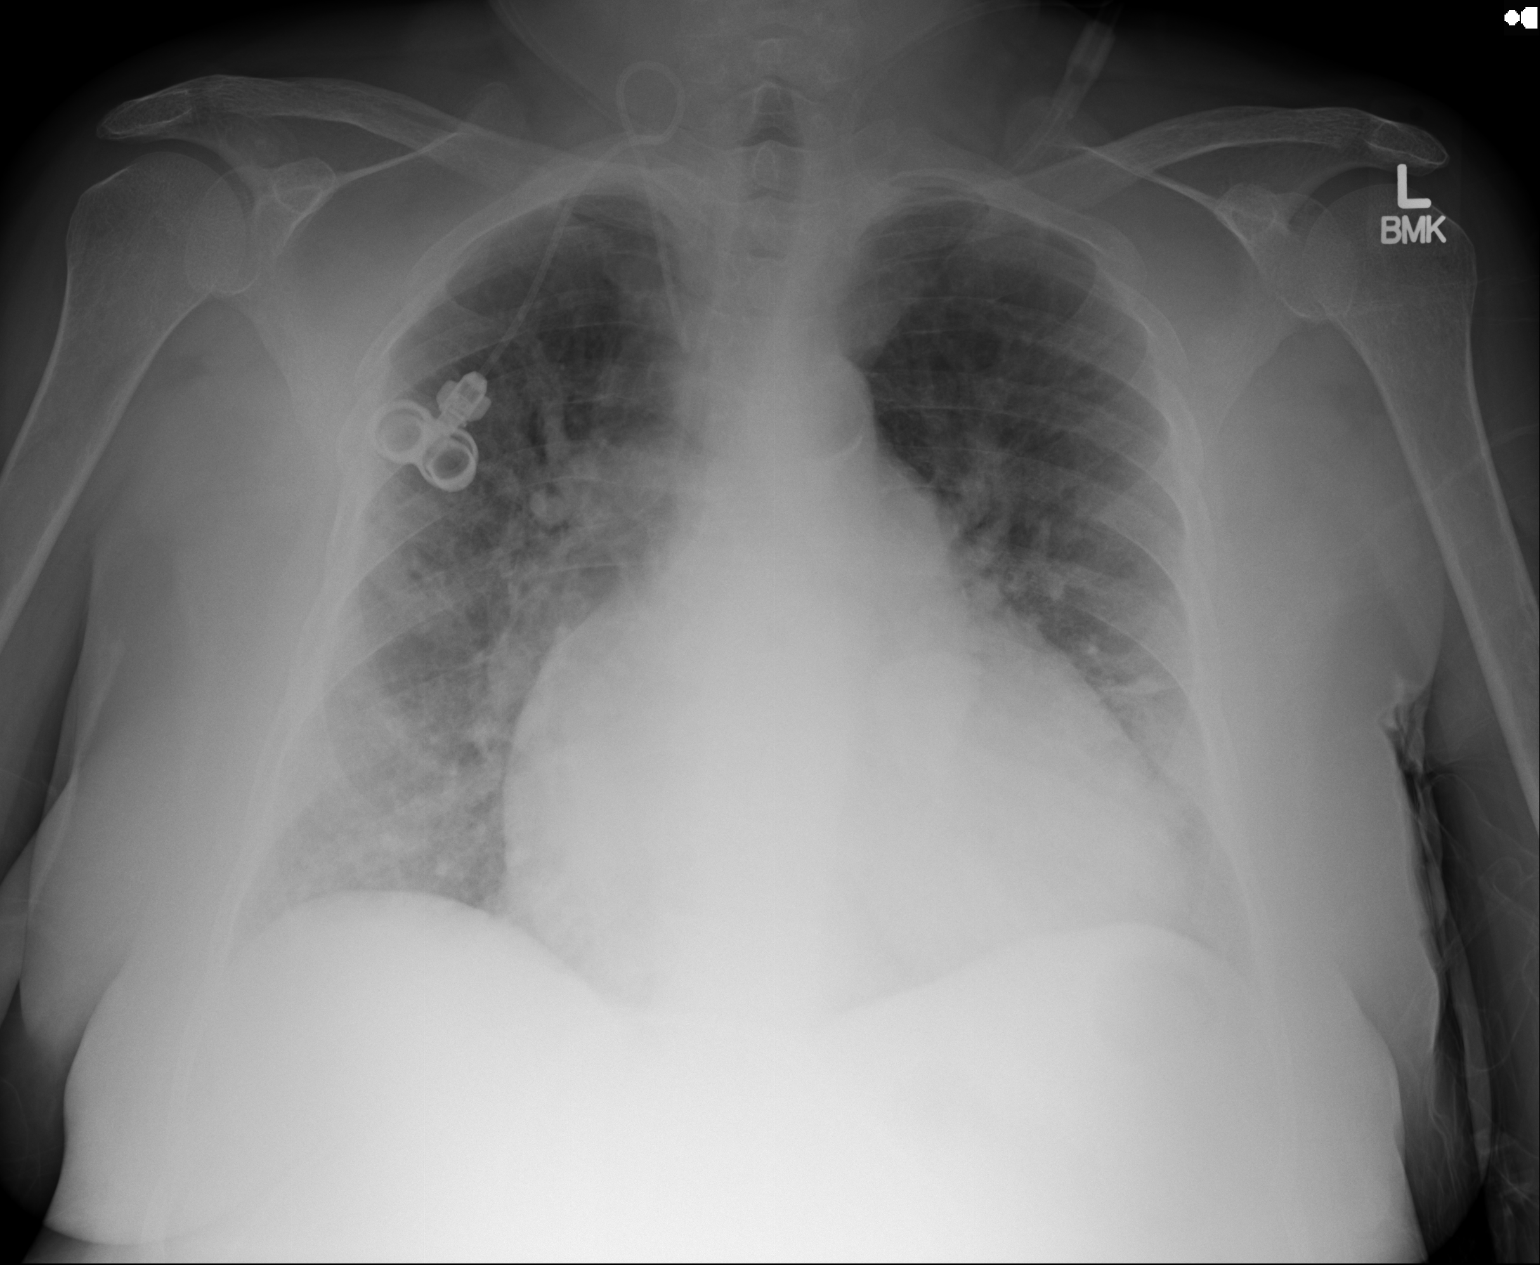

[1 of 1 positions shown; findings below may reference images not displayed]

FINDINGS: The lungs are well-aerated. Vascular congestion is noted, with
mildly increased interstitial markings, raising concern for mild
interstitial edema. There is no evidence of pleural effusion or
pneumothorax.

The cardiomediastinal silhouette is enlarged. A right-sided chest
port is noted ending about the proximal SVC. No acute osseous
abnormalities are seen.
IMPRESSION: Vascular congestion and cardiomegaly, with mildly increased
interstitial markings, raising concern for mild interstitial edema.

## 2017-04-17 IMAGING — CR DG CHEST 1V
1 series · 1 of 1 positions shown · non-contrast
Comparison: 06/05/2015

CLINICAL DATA: History of COPD, hypertension, former smoker. Sickle
cell disease.

EXAM:
CHEST 1 VIEW

[ap]
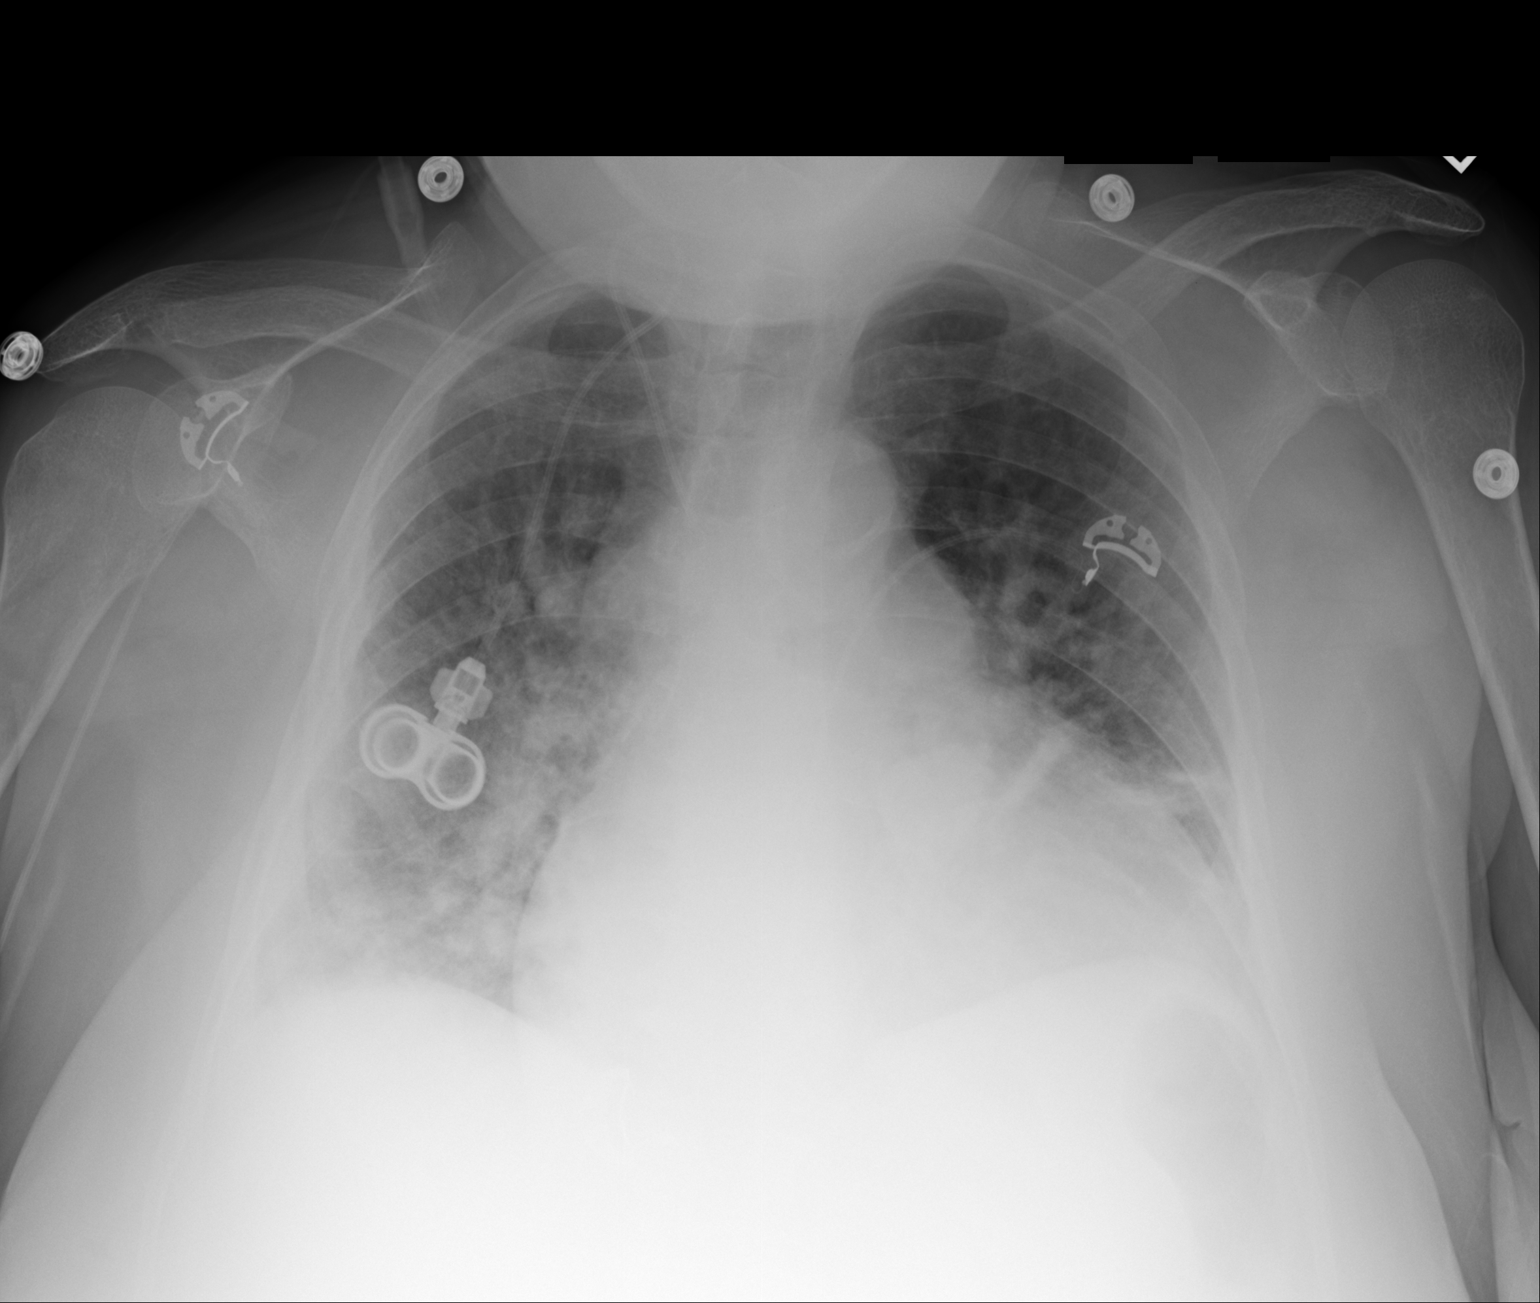

[1 of 1 positions shown; findings below may reference images not displayed]

FINDINGS: Right internal jugular approach central venous catheter is unchanged
in position.

The cardiac silhouette is stably enlarged. Mediastinal contours
appear intact.

There are bilateral lower lobe predominant patchy areas of airspace
consolidation, on the background of increased interstitial markings.
There is no evidence of pneumothorax or pleural effusions.

Osseous structures are without acute abnormality. Soft tissues are
grossly normal.
IMPRESSION: Stably enlarged cardiac silhouette.

Bilateral lower lobe predominant patchy areas of airspace
consolidation, worse from the prior radiograph. Differential
diagnosis includes development of pulmonary edema versus multifocal
pneumonia.

Underlying pulmonary vascular congestion.

## 2018-03-20 IMAGING — CR DG HIP (WITH OR WITHOUT PELVIS) 2-3V*L*
3 series · 3 of 3 positions shown · non-contrast
Comparison: None.

CLINICAL DATA: Hip pain.

EXAM:
DG HIP (WITH OR WITHOUT PELVIS) 2-3V LEFT

[pelvis ap]
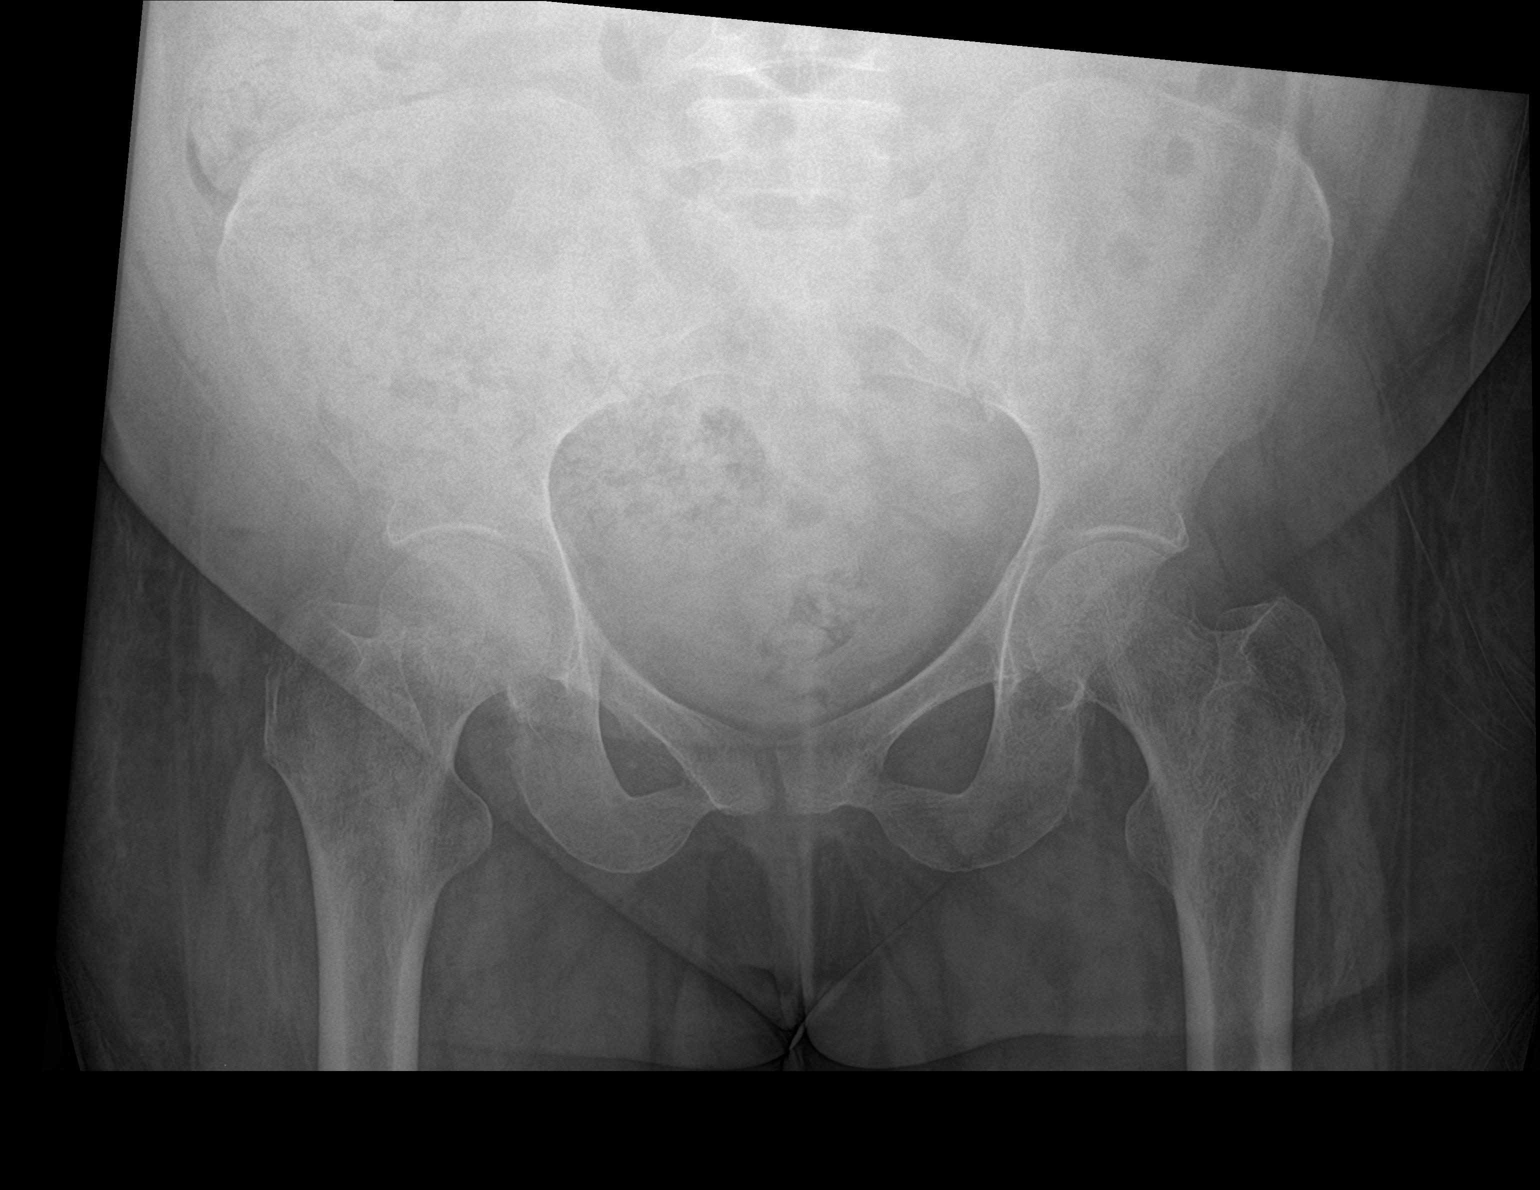

[hip ap]
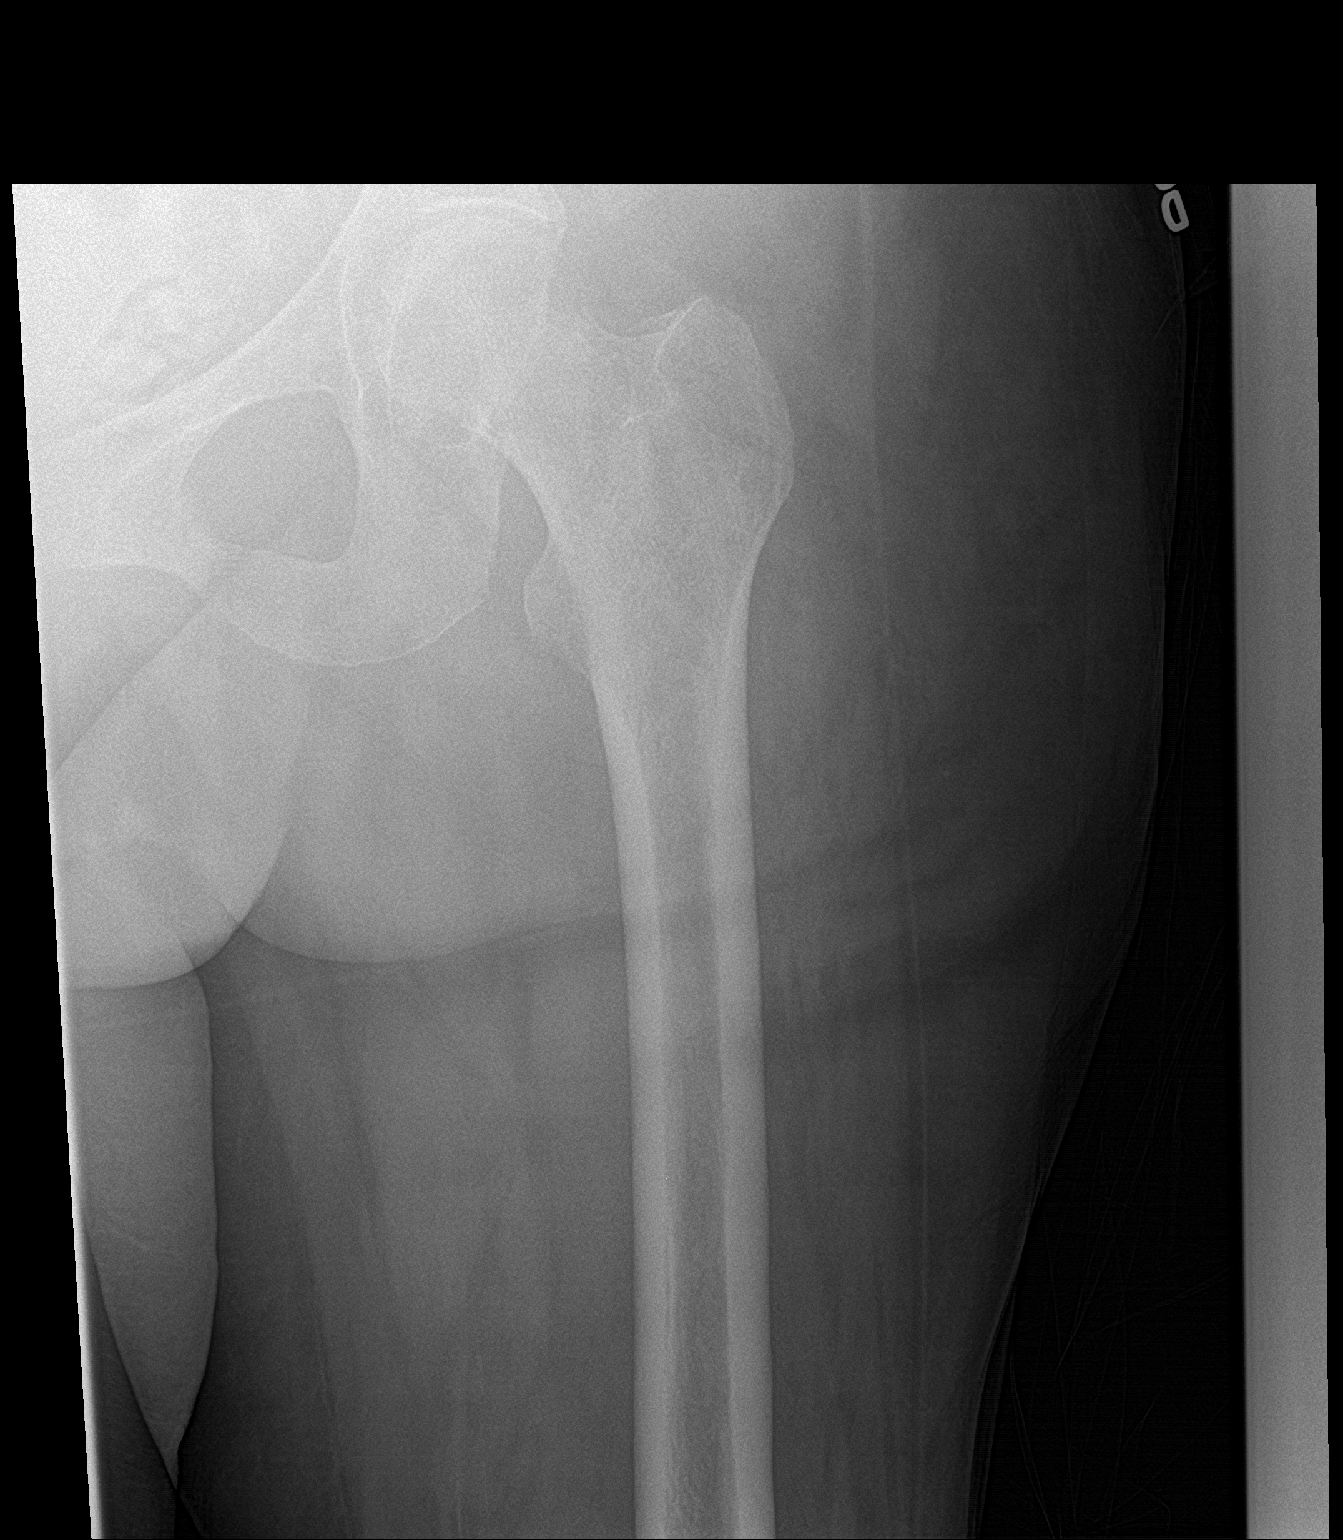

[hip lat]
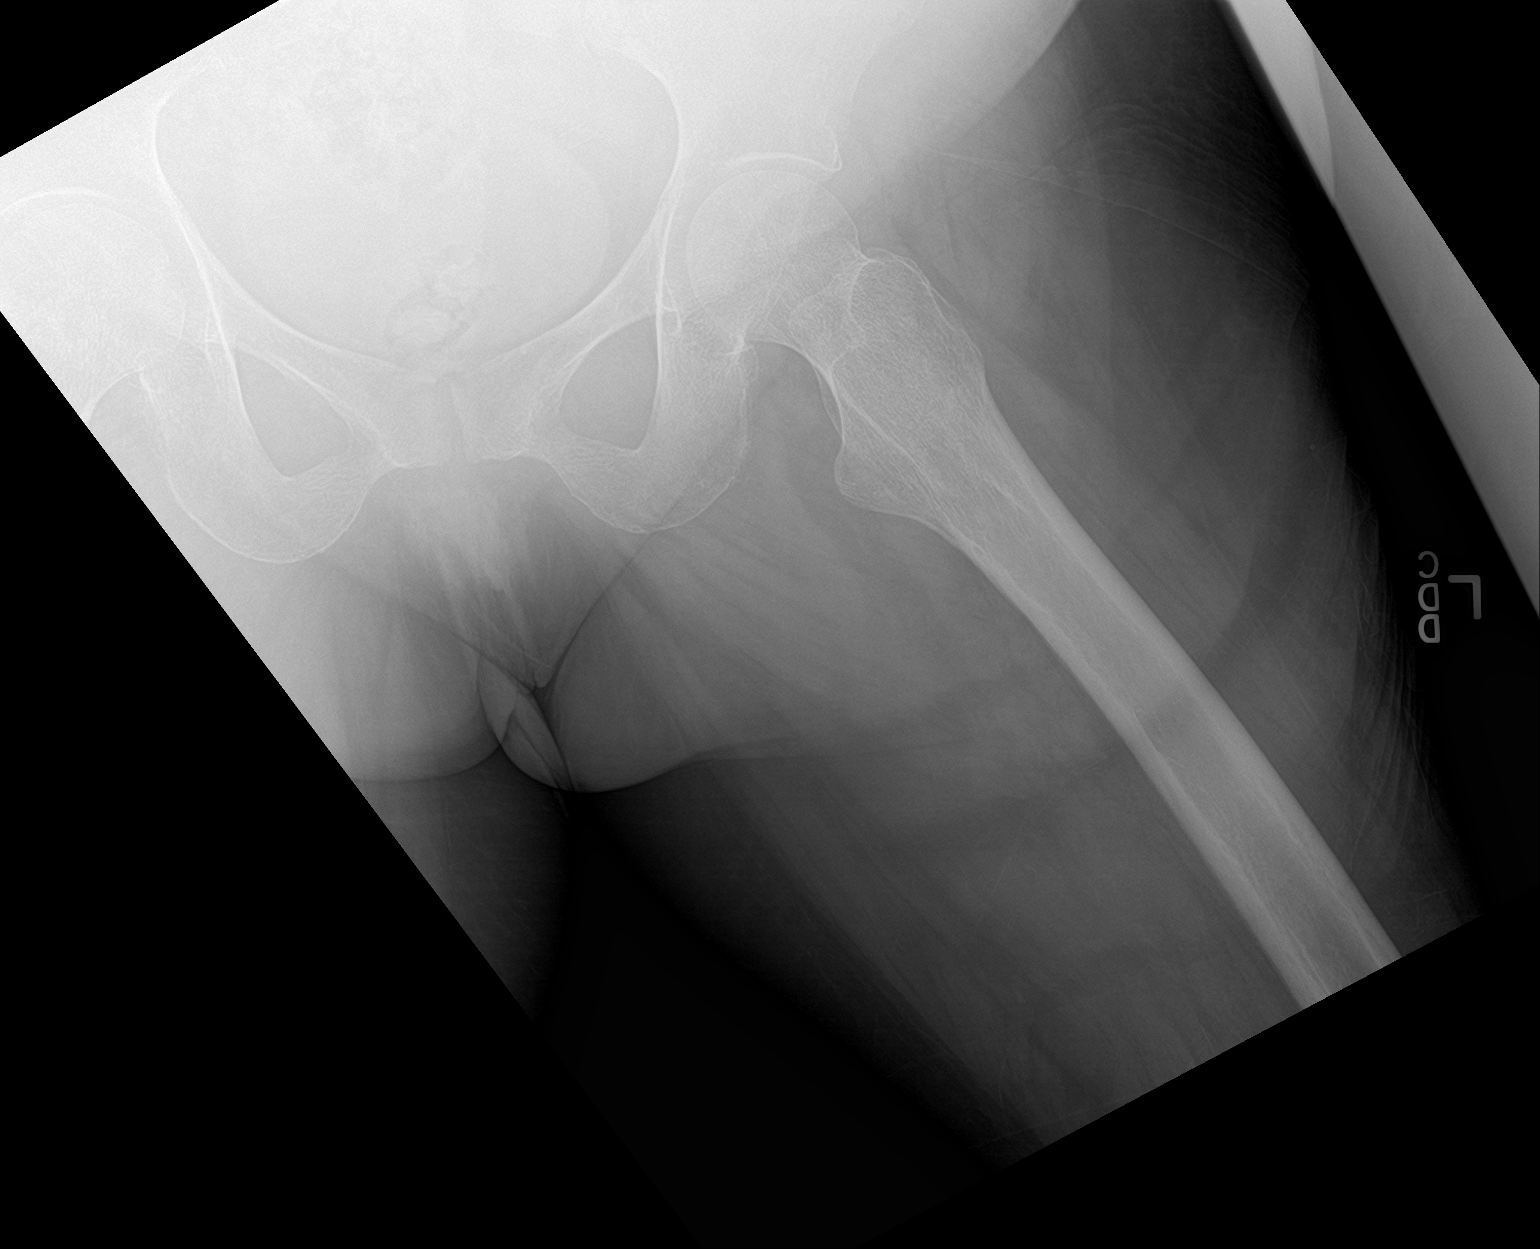

[3 of 3 positions shown; findings below may reference images not displayed]

FINDINGS: There is no evidence of hip fracture or dislocation. There is no
evidence of arthropathy or other focal bone abnormality.
IMPRESSION: Normal left hip.

## 2018-09-30 DEATH — deceased
